# Patient Record
Sex: Female | Born: 1950 | Race: Black or African American | Hispanic: No | Smoking: Never smoker
Health system: Southern US, Community
[De-identification: ages and names within clinical notes are randomized; demographics above are authoritative.]

## PROBLEM LIST (undated history)

## (undated) DIAGNOSIS — Z9221 Personal history of antineoplastic chemotherapy: Secondary | ICD-10-CM

## (undated) DIAGNOSIS — C189 Malignant neoplasm of colon, unspecified: Secondary | ICD-10-CM

## (undated) DIAGNOSIS — Z9889 Other specified postprocedural states: Secondary | ICD-10-CM

## (undated) DIAGNOSIS — C50919 Malignant neoplasm of unspecified site of unspecified female breast: Secondary | ICD-10-CM

## (undated) DIAGNOSIS — Z923 Personal history of irradiation: Secondary | ICD-10-CM

## (undated) DIAGNOSIS — I10 Essential (primary) hypertension: Secondary | ICD-10-CM

## (undated) DIAGNOSIS — IMO0001 Reserved for inherently not codable concepts without codable children: Secondary | ICD-10-CM

## (undated) DIAGNOSIS — IMO0002 Reserved for concepts with insufficient information to code with codable children: Secondary | ICD-10-CM

## (undated) HISTORY — DX: Reserved for inherently not codable concepts without codable children: IMO0001

## (undated) HISTORY — PX: COLONOSCOPY: SHX174

## (undated) HISTORY — DX: Malignant neoplasm of colon, unspecified: C18.9

## (undated) HISTORY — DX: Other specified postprocedural states: Z98.890

## (undated) HISTORY — DX: Reserved for concepts with insufficient information to code with codable children: IMO0002

---

## 1986-09-15 HISTORY — PX: ABDOMINAL HYSTERECTOMY: SHX81

## 1986-09-15 HISTORY — PX: COLON SURGERY: SHX602

## 1998-03-12 ENCOUNTER — Ambulatory Visit (HOSPITAL_COMMUNITY): Admission: RE | Admit: 1998-03-12 | Discharge: 1998-03-12 | Payer: Self-pay | Admitting: Family Medicine

## 1999-03-14 ENCOUNTER — Ambulatory Visit (HOSPITAL_COMMUNITY): Admission: RE | Admit: 1999-03-14 | Discharge: 1999-03-14 | Payer: Self-pay | Admitting: Family Medicine

## 1999-03-14 ENCOUNTER — Encounter: Payer: Self-pay | Admitting: Family Medicine

## 1999-03-21 ENCOUNTER — Other Ambulatory Visit: Admission: RE | Admit: 1999-03-21 | Discharge: 1999-03-21 | Payer: Self-pay | Admitting: Family Medicine

## 2000-03-20 ENCOUNTER — Encounter: Payer: Self-pay | Admitting: Family Medicine

## 2000-03-20 ENCOUNTER — Ambulatory Visit (HOSPITAL_COMMUNITY): Admission: RE | Admit: 2000-03-20 | Discharge: 2000-03-20 | Payer: Self-pay | Admitting: Family Medicine

## 2001-03-22 ENCOUNTER — Ambulatory Visit (HOSPITAL_COMMUNITY): Admission: RE | Admit: 2001-03-22 | Discharge: 2001-03-22 | Payer: Self-pay | Admitting: Family Medicine

## 2001-03-22 ENCOUNTER — Encounter: Payer: Self-pay | Admitting: Family Medicine

## 2001-04-22 ENCOUNTER — Other Ambulatory Visit: Admission: RE | Admit: 2001-04-22 | Discharge: 2001-04-22 | Payer: Self-pay | Admitting: Family Medicine

## 2002-02-14 ENCOUNTER — Encounter: Admission: RE | Admit: 2002-02-14 | Discharge: 2002-02-14 | Payer: Self-pay | Admitting: Family Medicine

## 2002-02-14 ENCOUNTER — Encounter: Payer: Self-pay | Admitting: Family Medicine

## 2002-05-02 ENCOUNTER — Encounter: Payer: Self-pay | Admitting: Family Medicine

## 2002-05-02 ENCOUNTER — Ambulatory Visit (HOSPITAL_COMMUNITY): Admission: RE | Admit: 2002-05-02 | Discharge: 2002-05-02 | Payer: Self-pay | Admitting: Family Medicine

## 2003-05-10 ENCOUNTER — Ambulatory Visit (HOSPITAL_COMMUNITY): Admission: RE | Admit: 2003-05-10 | Discharge: 2003-05-10 | Payer: Self-pay | Admitting: Family Medicine

## 2003-05-10 ENCOUNTER — Encounter: Payer: Self-pay | Admitting: Family Medicine

## 2003-06-01 ENCOUNTER — Encounter: Admission: RE | Admit: 2003-06-01 | Discharge: 2003-06-01 | Payer: Self-pay | Admitting: Family Medicine

## 2003-06-01 ENCOUNTER — Encounter: Payer: Self-pay | Admitting: Family Medicine

## 2003-06-02 ENCOUNTER — Encounter: Admission: RE | Admit: 2003-06-02 | Discharge: 2003-06-21 | Payer: Self-pay | Admitting: Family Medicine

## 2004-05-14 ENCOUNTER — Ambulatory Visit (HOSPITAL_COMMUNITY): Admission: RE | Admit: 2004-05-14 | Discharge: 2004-05-14 | Payer: Self-pay | Admitting: Family Medicine

## 2004-05-23 ENCOUNTER — Ambulatory Visit (HOSPITAL_COMMUNITY): Admission: RE | Admit: 2004-05-23 | Discharge: 2004-05-23 | Payer: Self-pay | Admitting: Family Medicine

## 2004-06-28 ENCOUNTER — Emergency Department (HOSPITAL_COMMUNITY): Admission: EM | Admit: 2004-06-28 | Discharge: 2004-06-28 | Payer: Self-pay | Admitting: Emergency Medicine

## 2004-07-30 ENCOUNTER — Other Ambulatory Visit: Admission: RE | Admit: 2004-07-30 | Discharge: 2004-07-30 | Payer: Self-pay | Admitting: Family Medicine

## 2004-10-02 ENCOUNTER — Ambulatory Visit (HOSPITAL_COMMUNITY): Admission: RE | Admit: 2004-10-02 | Discharge: 2004-10-02 | Payer: Self-pay | Admitting: Gastroenterology

## 2004-10-02 ENCOUNTER — Encounter (INDEPENDENT_AMBULATORY_CARE_PROVIDER_SITE_OTHER): Payer: Self-pay | Admitting: *Deleted

## 2004-11-12 ENCOUNTER — Encounter (INDEPENDENT_AMBULATORY_CARE_PROVIDER_SITE_OTHER): Payer: Self-pay | Admitting: Specialist

## 2004-11-12 ENCOUNTER — Ambulatory Visit (HOSPITAL_COMMUNITY): Admission: RE | Admit: 2004-11-12 | Discharge: 2004-11-12 | Payer: Self-pay | Admitting: Gastroenterology

## 2005-05-27 ENCOUNTER — Ambulatory Visit (HOSPITAL_COMMUNITY): Admission: RE | Admit: 2005-05-27 | Discharge: 2005-05-27 | Payer: Self-pay | Admitting: Family Medicine

## 2005-08-12 ENCOUNTER — Other Ambulatory Visit: Admission: RE | Admit: 2005-08-12 | Discharge: 2005-08-12 | Payer: Self-pay | Admitting: Family Medicine

## 2006-05-29 ENCOUNTER — Ambulatory Visit (HOSPITAL_COMMUNITY): Admission: RE | Admit: 2006-05-29 | Discharge: 2006-05-29 | Payer: Self-pay | Admitting: Family Medicine

## 2007-04-27 ENCOUNTER — Emergency Department (HOSPITAL_COMMUNITY): Admission: EM | Admit: 2007-04-27 | Discharge: 2007-04-27 | Payer: Self-pay | Admitting: Emergency Medicine

## 2007-05-31 ENCOUNTER — Ambulatory Visit (HOSPITAL_COMMUNITY): Admission: RE | Admit: 2007-05-31 | Discharge: 2007-05-31 | Payer: Self-pay | Admitting: Family Medicine

## 2007-09-16 HISTORY — PX: TOTAL SHOULDER ARTHROPLASTY: SHX126

## 2008-02-28 ENCOUNTER — Encounter: Admission: RE | Admit: 2008-02-28 | Discharge: 2008-02-28 | Payer: Self-pay | Admitting: Orthopedic Surgery

## 2008-03-15 ENCOUNTER — Encounter: Admission: RE | Admit: 2008-03-15 | Discharge: 2008-03-15 | Payer: Self-pay | Admitting: Orthopedic Surgery

## 2008-04-06 ENCOUNTER — Inpatient Hospital Stay (HOSPITAL_COMMUNITY): Admission: RE | Admit: 2008-04-06 | Discharge: 2008-04-09 | Payer: Self-pay | Admitting: Orthopedic Surgery

## 2008-05-05 ENCOUNTER — Encounter: Admission: RE | Admit: 2008-05-05 | Discharge: 2008-05-05 | Payer: Self-pay | Admitting: Orthopedic Surgery

## 2008-06-16 ENCOUNTER — Ambulatory Visit (HOSPITAL_COMMUNITY): Admission: RE | Admit: 2008-06-16 | Discharge: 2008-06-16 | Payer: Self-pay | Admitting: Family Medicine

## 2010-04-12 ENCOUNTER — Observation Stay (HOSPITAL_COMMUNITY): Admission: EM | Admit: 2010-04-12 | Discharge: 2010-04-13 | Payer: Self-pay | Admitting: Emergency Medicine

## 2010-04-12 ENCOUNTER — Ambulatory Visit: Payer: Self-pay | Admitting: Cardiology

## 2010-08-20 ENCOUNTER — Ambulatory Visit (HOSPITAL_COMMUNITY)
Admission: RE | Admit: 2010-08-20 | Discharge: 2010-08-20 | Payer: Self-pay | Source: Home / Self Care | Admitting: Family Medicine

## 2010-11-30 LAB — CBC
HCT: 32.9 % — ABNORMAL LOW (ref 36.0–46.0)
HCT: 35.7 % — ABNORMAL LOW (ref 36.0–46.0)
MCH: 30.6 pg (ref 26.0–34.0)
MCHC: 33.8 g/dL (ref 30.0–36.0)
MCV: 88.8 fL (ref 78.0–100.0)
MCV: 89.8 fL (ref 78.0–100.0)
Platelets: 281 10*3/uL (ref 150–400)
RDW: 13.3 % (ref 11.5–15.5)
RDW: 13.4 % (ref 11.5–15.5)

## 2010-11-30 LAB — POCT CARDIAC MARKERS
Myoglobin, poc: 77.1 ng/mL (ref 12–200)
Troponin i, poc: <0.05 ng/mL (ref 0.00–0.09)

## 2010-11-30 LAB — POCT I-STAT, CHEM 8
BUN: 7 mg/dL (ref 6–23)
HCT: 36 % (ref 36.0–46.0)
Hemoglobin: 12.2 g/dL (ref 12.0–15.0)
Sodium: 141 mEq/L (ref 135–145)

## 2010-11-30 LAB — DIFFERENTIAL: Basophils Absolute: 0.1 10*3/uL (ref 0.0–0.1)

## 2010-11-30 LAB — LIPID PANEL
Total CHOL/HDL Ratio: 2.7 RATIO
VLDL: 13 mg/dL (ref 0–40)

## 2010-11-30 LAB — BASIC METABOLIC PANEL
BUN: 5 mg/dL — ABNORMAL LOW (ref 6–23)
CO2: 29 mEq/L (ref 19–32)
Chloride: 107 mEq/L (ref 96–112)
Glucose, Bld: 110 mg/dL — ABNORMAL HIGH (ref 70–99)
Potassium: 3.4 mEq/L — ABNORMAL LOW (ref 3.5–5.1)

## 2010-11-30 LAB — CK TOTAL AND CKMB (NOT AT ARMC)
Relative Index: 0.8 (ref 0.0–2.5)
Total CK: 118 U/L (ref 7–177)

## 2010-11-30 LAB — RAPID URINE DRUG SCREEN, HOSP PERFORMED
Amphetamines: NOT DETECTED
Barbiturates: NOT DETECTED

## 2010-11-30 LAB — TSH: TSH: 1.047 u[IU]/mL (ref 0.350–4.500)

## 2010-11-30 LAB — TROPONIN I: Troponin I: 0.01 ng/mL (ref 0.00–0.06)

## 2010-11-30 LAB — D-DIMER, QUANTITATIVE: D-Dimer, Quant: 0.74 ug/mL-FEU — ABNORMAL HIGH (ref 0.00–0.48)

## 2011-01-28 NOTE — Op Note (Signed)
NAMENAEVIA, UNTERREINER              ACCOUNT NO.:  [id number]   MEDICAL RECORD NO.:  [id number]          PATIENT TYPE:  INP   LOCATION:  5031                         FACILITY:  [facility]   PHYSICIAN:  Myrtie Neither, MD      DATE OF BIRTH:  April 09, 1951   DATE OF PROCEDURE:  04/06/2008  DATE OF DISCHARGE:  04/09/2008                               OPERATIVE REPORT   PREOPERATIVE DIAGNOSES:  Aseptic necrosis, degenerative joint  arthropathy, right shoulder.   POSTOPERATIVE DIAGNOSES:  Aseptic necrosis, degenerative joint  arthropathy, right shoulder.   ANESTHESIA:  General.   PROCEDURES:  Right total shoulder arthroplasty with Biomet implant.   The patient was taken to the operating room after given adequate preop  medications, given general anesthesia, and intubated.  The patient was  placed in barber-chair position.  Right shoulder prepped with DuraPrep  and draped in a sterile manner.  Deltopectoral incision was made going  through skin, subcutaneous tissue.  Deltopectoral muscle separation was  done down through the subscapularis.  Subscapularis was then released  and reflected medially.  Capsule was incised.  Humeral head,  glenohumeral joint was exposed.  Head itself was dislocated.  Intramedullary guide was placed down into the humeral canal just medial  to the tuberosity.  Appropriate settings were made for retroversion and  the humeral head cut was made.  With the humeral head removed and  intramedullary reaming of the humeral canal was done and rasping was  done for an 8-mm stem.  After adequate rasping, an 8 mm x 122 mm porous-  coated humeral stem from a standard was used with a modular head 42 x 18  x 46 mm with a taper adapter.  This was press-fitted down the humeral  shaft.  Then with the head relocated and the glenoid extracted  anteriorly, appropriate cutting jigs for the glenoid was done with  resection of the articular surface and this was fitted for a 4-mm small  glenoid base.  Appropriate drill holes were placed through the cutting  jig with a central was press-fit porous-coated stem, and after adequate  irrigation and use of the trial, which showed good stability and good  range of motion in abduction, full extension without subluxation.  Trial  components were removed followed by mixing of cement and the glenoid was  cemented with a porous-coated holes.  Excess methylmethacrylate was  removed after cement had set.  Humeral head was then reduced and again  range of motion was tested and did not demonstrate any subluxation or  dislocation.  Good glenohumeral contact.  Further irrigation was then  done.  Wound closure was then done with reapproximation of the  subscapularis and reapproximation of the fascia of the deltoid.  Subcutaneous and this was done with 0 Vicryl.  Subcutaneous was done  with 2-0 Vicryl and skin staples for skin.  With the arm in the internal  rotation, shoulder abduction with a pillow, brace was applied.  The  patient tolerated the procedure quite well, went to recovery room in  stable and satisfactory condition.      Myrtie Neither,  MD  Electronically Signed     AC/MEDQ  D:  05/31/2008  T:  05/31/2008  Job:  [id number]

## 2011-01-28 NOTE — Discharge Summary (Signed)
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   DISCHARGE DIAGNOSES:  1. Avascular necrosis and severe degenerative joint disease, right      shoulder.  2. History of hypertension.   INFECTIONS:  None.   OPERATION:  Right total shoulder arthroplasty, Biomet implant.   COMPLICATION:  None.   PERTINENT HISTORY:  This is a 60 year old female who had a long history  of right shoulder pain, progressive weakness, and loss of function.  The  patient had been treated with anti-inflammatories and pain medications.  The patient was found to have aseptic necrosis of her humeral head and  severe degenerative joint arthropathy of the right shoulder.   PERTINENT PHYSICAL:  The right shoulder tender anterolaterally.  Audible  and palpable crepitus on attempted abduction as well as rotation.  Limited rotation and abduction.  Weakness and increased pain on  resistive abduction.  Good grip.  The patient is intrinsically intact.   HOSPITAL COURSE:  The patient underwent preop medical eval by her  medical doctor as well as CBC, EKG, chest x-ray, PT/PTT, platelet count,  UA, and CMET.  The patient was found to be stable to undergo surgery and  underwent right total shoulder arthroplasty and tolerated the procedure  quite well.  Postop course fairly benign.  The patient was started on  pre and postop IV antibiotics and prophylactic Coumadin.  The patient's  pain was brought under control with oral Percocet 1-2 q.4 h. p.r.n.  pain, afebrile.  The patient was able to be discharged on Coumadin 5  mg  daily, Percocet 5 mg 1-2 q.4 h. p.r.n. pain.  Continue ice pack and  continue with the shoulder abduction brace.  The patient will be seen  back in the office in 1 week and was discharged in stable and  satisfactory condition.      Myrtie Neither, MD  Electronically Signed     AC/MEDQ  D:  05/31/2008  T:  05/31/2008  Job:  [id number]

## 2011-01-31 NOTE — Op Note (Signed)
[name], [name]              ACCOUNT NO.:  [id number]   MEDICAL RECORD NO.:  [id number]          PATIENT TYPE:  AMB   LOCATION:  ENDO                         FACILITY:  [facility]   PHYSICIAN:  Anselmo Rod, M.D.  DATE OF BIRTH:  08-05-1951   DATE OF PROCEDURE:  10/02/2004  DATE OF DISCHARGE:                                 OPERATIVE REPORT   PROCEDURE PERFORMED:  Colonoscopy with snare polypectomy x2.   ENDOSCOPIST:  Anselmo Rod, M.D.   INSTRUMENT USED:  Olympus video colonoscope.   INDICATION FOR PROCEDURE:  A 60 year old African-American female undergoing  a screening colonoscopy to rule out colonic polyps, masses, etc.  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  No records are available to me at this time  with regard to the details on this surgery.  The patient says she had a  hysterectomy at the same time and was incidentally found to have a tumor,  question in her colon or on her colon.  The details seem to be somewhat  unclear.   PREPROCEDURE PREPARATION:  Informed consent was procured from the patient.  The patient was fasted for eight hours prior to the procedure and prepped  with a bottle of magnesium citrate and a gallon of GoLYTELY the night prior  to the procedure.  Risks and benefits of the procedure including a 10% miss  rate of cancer and polyps were discussed with her as well.   PREPROCEDURE PHYSICAL:  VITAL SIGNS:  The patient had stable vital signs.  NECK:  Supple.  CHEST:  Clear to auscultation.  S1, S2 regular.  ABDOMEN:  Soft with normal bowel sounds.  A well-healed surgical scar from  previous partial colectomy and colostomy that has been reversed now.   DESCRIPTION OF PROCEDURE:  The patient was placed in the left lateral  decubitus position and sedated with 75 mg of Demerol and 7.5 mg of Versed in  slow incremental doses.  Once the patient was adequately sedate and  maintained  on low-flow oxygen and continuous cardiac monitoring, the Olympus  video colonoscope was advanced from the rectum to the anastomosis without  difficulty.  The distal small bowel limb appeared healthy, and so did the  anastomosis.  The patient seemed to have undergone a right hemicolectomy.  There were scattered diverticula seen throughout the colon.  An inverted  diverticulum was appreciated in the sigmoid colon.  A small sessile polyp  was snared from 55 cm and another polyp snared from the rectosigmoid colon  and were placed in one bottle.   IMPRESSION:  1.  Scattered early diverticulosis.  2.  Status post right hemicolectomy with a healthy anastomosis and distal      small bowel limb.  3.  One small sessile polyp snared from 65 cm, another small sessile polyp      snared from rectosigmoid colon.   RECOMMENDATIONS:  1.  Await pathology results.  2.  Avoid nonsteroidals, including aspirin, for the next four weeks.  3.  Outpatient follow-up in the  next two weeks for further recommendations      and follow-up on abnormal LFTs.      Jyot   JNM/MEDQ  D:  10/02/2004  T:  10/02/2004  Job:  [id number]   cc:   Renaye Rakers, M.D.  [phone] [address]  [place]  [place] [address]  Fax: [phone]

## 2011-06-13 LAB — ABO/RH: ABO/RH(D): O POS

## 2011-06-13 LAB — COMPREHENSIVE METABOLIC PANEL
Albumin: 3.6
Alkaline Phosphatase: 103
BUN: 6
Calcium: 9.4
Potassium: 3.7
Sodium: 141
Total Protein: 6.9

## 2011-06-13 LAB — PROTIME-INR
INR: 1
INR: 1.2
INR: 4.2 — ABNORMAL HIGH
Prothrombin Time: 13.5
Prothrombin Time: 44 — ABNORMAL HIGH

## 2011-06-13 LAB — TYPE AND SCREEN: Antibody Screen: NEGATIVE

## 2011-06-13 LAB — HEMOGLOBIN AND HEMATOCRIT, BLOOD: Hemoglobin: 9.2 — ABNORMAL LOW

## 2011-06-13 LAB — URINALYSIS, ROUTINE W REFLEX MICROSCOPIC
Hgb urine dipstick: NEGATIVE
Nitrite: NEGATIVE
Protein, ur: NEGATIVE
Specific Gravity, Urine: 1.021
Urobilinogen, UA: 0.2

## 2011-06-13 LAB — CBC
HCT: 33.6 — ABNORMAL LOW
MCHC: 32.5
Platelets: 360
RDW: 14.7

## 2011-07-17 ENCOUNTER — Other Ambulatory Visit (HOSPITAL_COMMUNITY): Payer: Self-pay | Admitting: Family Medicine

## 2011-07-17 DIAGNOSIS — Z1231 Encounter for screening mammogram for malignant neoplasm of breast: Secondary | ICD-10-CM

## 2011-08-22 ENCOUNTER — Ambulatory Visit (HOSPITAL_COMMUNITY)
Admission: RE | Admit: 2011-08-22 | Discharge: 2011-08-22 | Disposition: A | Discharge status: Home / Self Care | Payer: Medicare Other | Source: Ambulatory Visit | Attending: Family Medicine | Admitting: Family Medicine

## 2011-08-22 DIAGNOSIS — Z1231 Encounter for screening mammogram for malignant neoplasm of breast: Secondary | ICD-10-CM

## 2012-08-09 ENCOUNTER — Other Ambulatory Visit (HOSPITAL_COMMUNITY): Payer: Self-pay | Admitting: Family Medicine

## 2012-08-09 DIAGNOSIS — Z1231 Encounter for screening mammogram for malignant neoplasm of breast: Secondary | ICD-10-CM

## 2012-08-26 ENCOUNTER — Ambulatory Visit (HOSPITAL_COMMUNITY)
Admission: RE | Admit: 2012-08-26 | Discharge: 2012-08-26 | Disposition: A | Discharge status: Home / Self Care | Payer: Medicare Other | Source: Ambulatory Visit | Attending: Family Medicine | Admitting: Family Medicine

## 2012-08-26 DIAGNOSIS — Z1231 Encounter for screening mammogram for malignant neoplasm of breast: Secondary | ICD-10-CM | POA: Diagnosis not present

## 2012-09-01 ENCOUNTER — Other Ambulatory Visit: Payer: Self-pay | Admitting: Family Medicine

## 2012-09-01 DIAGNOSIS — R928 Other abnormal and inconclusive findings on diagnostic imaging of breast: Secondary | ICD-10-CM

## 2012-09-14 ENCOUNTER — Ambulatory Visit
Admission: RE | Admit: 2012-09-14 | Discharge: 2012-09-14 | Disposition: A | Discharge status: Home / Self Care | Payer: Medicare Other | Source: Ambulatory Visit | Attending: Family Medicine | Admitting: Family Medicine

## 2012-09-14 DIAGNOSIS — R928 Other abnormal and inconclusive findings on diagnostic imaging of breast: Secondary | ICD-10-CM

## 2013-02-14 ENCOUNTER — Other Ambulatory Visit: Payer: Self-pay | Admitting: Family Medicine

## 2013-02-14 DIAGNOSIS — N6489 Other specified disorders of breast: Secondary | ICD-10-CM

## 2013-03-07 ENCOUNTER — Ambulatory Visit
Admission: RE | Admit: 2013-03-07 | Discharge: 2013-03-07 | Disposition: A | Discharge status: Home / Self Care | Payer: Medicare Other | Source: Ambulatory Visit | Attending: Family Medicine | Admitting: Family Medicine

## 2013-03-07 DIAGNOSIS — R928 Other abnormal and inconclusive findings on diagnostic imaging of breast: Secondary | ICD-10-CM | POA: Diagnosis not present

## 2013-03-07 DIAGNOSIS — N6489 Other specified disorders of breast: Secondary | ICD-10-CM

## 2013-09-05 ENCOUNTER — Other Ambulatory Visit: Payer: Self-pay | Admitting: Family Medicine

## 2013-09-05 DIAGNOSIS — N6489 Other specified disorders of breast: Secondary | ICD-10-CM

## 2014-01-12 DIAGNOSIS — M25669 Stiffness of unspecified knee, not elsewhere classified: Secondary | ICD-10-CM | POA: Diagnosis not present

## 2014-02-03 ENCOUNTER — Other Ambulatory Visit: Payer: Self-pay | Admitting: Nurse Practitioner

## 2014-02-03 DIAGNOSIS — M25669 Stiffness of unspecified knee, not elsewhere classified: Secondary | ICD-10-CM | POA: Diagnosis not present

## 2014-02-03 DIAGNOSIS — Z01419 Encounter for gynecological examination (general) (routine) without abnormal findings: Secondary | ICD-10-CM | POA: Diagnosis not present

## 2014-02-03 DIAGNOSIS — R928 Other abnormal and inconclusive findings on diagnostic imaging of breast: Secondary | ICD-10-CM | POA: Diagnosis not present

## 2014-02-03 DIAGNOSIS — R799 Abnormal finding of blood chemistry, unspecified: Secondary | ICD-10-CM | POA: Diagnosis not present

## 2014-02-03 DIAGNOSIS — Z0289 Encounter for other administrative examinations: Secondary | ICD-10-CM | POA: Diagnosis not present

## 2014-02-03 DIAGNOSIS — E7800 Pure hypercholesterolemia, unspecified: Secondary | ICD-10-CM | POA: Diagnosis not present

## 2014-02-03 DIAGNOSIS — N6489 Other specified disorders of breast: Secondary | ICD-10-CM

## 2014-02-03 DIAGNOSIS — Z0000 Encounter for general adult medical examination without abnormal findings: Secondary | ICD-10-CM | POA: Diagnosis not present

## 2014-02-20 ENCOUNTER — Ambulatory Visit
Admission: RE | Admit: 2014-02-20 | Discharge: 2014-02-20 | Disposition: A | Discharge status: Home / Self Care | Payer: Medicare Other | Source: Ambulatory Visit | Attending: Nurse Practitioner | Admitting: Nurse Practitioner

## 2014-02-20 ENCOUNTER — Encounter (INDEPENDENT_AMBULATORY_CARE_PROVIDER_SITE_OTHER): Payer: Self-pay

## 2014-02-20 ENCOUNTER — Other Ambulatory Visit: Payer: Self-pay | Admitting: Nurse Practitioner

## 2014-02-20 DIAGNOSIS — N6489 Other specified disorders of breast: Secondary | ICD-10-CM

## 2014-02-20 DIAGNOSIS — N63 Unspecified lump in unspecified breast: Secondary | ICD-10-CM | POA: Diagnosis not present

## 2014-02-22 ENCOUNTER — Ambulatory Visit
Admission: RE | Admit: 2014-02-22 | Discharge: 2014-02-22 | Disposition: A | Discharge status: Home / Self Care | Payer: Medicare Other | Source: Ambulatory Visit | Attending: Nurse Practitioner | Admitting: Nurse Practitioner

## 2014-02-22 ENCOUNTER — Other Ambulatory Visit: Payer: Self-pay | Admitting: Nurse Practitioner

## 2014-02-22 DIAGNOSIS — R599 Enlarged lymph nodes, unspecified: Secondary | ICD-10-CM | POA: Diagnosis not present

## 2014-02-22 DIAGNOSIS — C50919 Malignant neoplasm of unspecified site of unspecified female breast: Secondary | ICD-10-CM | POA: Diagnosis not present

## 2014-02-22 DIAGNOSIS — N63 Unspecified lump in unspecified breast: Secondary | ICD-10-CM | POA: Diagnosis not present

## 2014-02-22 DIAGNOSIS — C773 Secondary and unspecified malignant neoplasm of axilla and upper limb lymph nodes: Secondary | ICD-10-CM | POA: Diagnosis not present

## 2014-02-22 DIAGNOSIS — N6489 Other specified disorders of breast: Secondary | ICD-10-CM

## 2014-02-23 ENCOUNTER — Other Ambulatory Visit: Payer: Self-pay | Admitting: Nurse Practitioner

## 2014-02-23 DIAGNOSIS — C50919 Malignant neoplasm of unspecified site of unspecified female breast: Secondary | ICD-10-CM

## 2014-02-24 ENCOUNTER — Telehealth: Payer: Self-pay | Admitting: *Deleted

## 2014-02-24 DIAGNOSIS — C50412 Malignant neoplasm of upper-outer quadrant of left female breast: Secondary | ICD-10-CM

## 2014-02-24 NOTE — Telephone Encounter (Signed)
Confirmed [facility] for 03/01/14 at 12N .  Instructions and contact information given.

## 2014-03-01 ENCOUNTER — Ambulatory Visit
Admission: RE | Admit: 2014-03-01 | Discharge: 2014-03-01 | Disposition: A | Discharge status: Home / Self Care | Payer: Medicare Other | Source: Ambulatory Visit | Attending: Nurse Practitioner | Admitting: Nurse Practitioner

## 2014-03-01 ENCOUNTER — Ambulatory Visit: Payer: Medicare Other | Attending: General Surgery | Admitting: Physical Therapy

## 2014-03-01 ENCOUNTER — Encounter: Payer: Self-pay | Admitting: Oncology

## 2014-03-01 ENCOUNTER — Encounter: Payer: Self-pay | Admitting: Radiation Oncology

## 2014-03-01 ENCOUNTER — Other Ambulatory Visit (HOSPITAL_BASED_OUTPATIENT_CLINIC_OR_DEPARTMENT_OTHER): Payer: Medicare Other

## 2014-03-01 ENCOUNTER — Ambulatory Visit
Admission: RE | Admit: 2014-03-01 | Discharge: 2014-03-01 | Disposition: A | Discharge status: Home / Self Care | Payer: Medicare Other | Source: Ambulatory Visit | Attending: Radiation Oncology | Admitting: Radiation Oncology

## 2014-03-01 ENCOUNTER — Telehealth: Payer: Self-pay | Admitting: Oncology

## 2014-03-01 ENCOUNTER — Ambulatory Visit (HOSPITAL_BASED_OUTPATIENT_CLINIC_OR_DEPARTMENT_OTHER): Payer: Medicare Other | Admitting: General Surgery

## 2014-03-01 ENCOUNTER — Ambulatory Visit: Payer: Medicare Other

## 2014-03-01 ENCOUNTER — Ambulatory Visit (HOSPITAL_BASED_OUTPATIENT_CLINIC_OR_DEPARTMENT_OTHER): Payer: Medicare Other | Admitting: Oncology

## 2014-03-01 ENCOUNTER — Telehealth: Payer: Self-pay | Admitting: *Deleted

## 2014-03-01 ENCOUNTER — Encounter: Payer: Self-pay | Admitting: *Deleted

## 2014-03-01 ENCOUNTER — Encounter (INDEPENDENT_AMBULATORY_CARE_PROVIDER_SITE_OTHER): Payer: Self-pay | Admitting: General Surgery

## 2014-03-01 VITALS — BP 138/92 | HR 74 | Temp 98.7°F | Resp 18 | Ht 61.0 in | Wt 245.2 lb

## 2014-03-01 DIAGNOSIS — C50412 Malignant neoplasm of upper-outer quadrant of left female breast: Secondary | ICD-10-CM

## 2014-03-01 DIAGNOSIS — Z85038 Personal history of other malignant neoplasm of large intestine: Secondary | ICD-10-CM | POA: Diagnosis not present

## 2014-03-01 DIAGNOSIS — C773 Secondary and unspecified malignant neoplasm of axilla and upper limb lymph nodes: Secondary | ICD-10-CM

## 2014-03-01 DIAGNOSIS — M25519 Pain in unspecified shoulder: Secondary | ICD-10-CM | POA: Diagnosis not present

## 2014-03-01 DIAGNOSIS — IMO0001 Reserved for inherently not codable concepts without codable children: Secondary | ICD-10-CM | POA: Diagnosis not present

## 2014-03-01 DIAGNOSIS — Z171 Estrogen receptor negative status [ER-]: Secondary | ICD-10-CM

## 2014-03-01 DIAGNOSIS — C50419 Malignant neoplasm of upper-outer quadrant of unspecified female breast: Secondary | ICD-10-CM | POA: Diagnosis not present

## 2014-03-01 DIAGNOSIS — C50919 Malignant neoplasm of unspecified site of unspecified female breast: Secondary | ICD-10-CM | POA: Diagnosis not present

## 2014-03-01 DIAGNOSIS — M24519 Contracture, unspecified shoulder: Secondary | ICD-10-CM | POA: Diagnosis not present

## 2014-03-01 DIAGNOSIS — Z803 Family history of malignant neoplasm of breast: Secondary | ICD-10-CM | POA: Diagnosis not present

## 2014-03-01 LAB — CBC WITH DIFFERENTIAL/PLATELET
BASO%: 0.3 % (ref 0.0–2.0)
BASOS ABS: 0 10*3/uL (ref 0.0–0.1)
EOS%: 2.1 % (ref 0.0–7.0)
Eosinophils Absolute: 0.1 10*3/uL (ref 0.0–0.5)
HEMATOCRIT: 37.4 % (ref 34.8–46.6)
HGB: 12.1 g/dL (ref 11.6–15.9)
LYMPH%: 31 % (ref 14.0–49.7)
MCH: 28.7 pg (ref 25.1–34.0)
MCHC: 32.4 g/dL (ref 31.5–36.0)
MCV: 88.6 fL (ref 79.5–101.0)
MONO#: 0.5 10*3/uL (ref 0.1–0.9)
MONO%: 7.3 % (ref 0.0–14.0)
NEUT%: 59.3 % (ref 38.4–76.8)
NEUTROS ABS: 3.9 10*3/uL (ref 1.5–6.5)
PLATELETS: 300 10*3/uL (ref 145–400)
RBC: 4.22 10*6/uL (ref 3.70–5.45)
RDW: 12.9 % (ref 11.2–14.5)
WBC: 6.6 10*3/uL (ref 3.9–10.3)
lymph#: 2 10*3/uL (ref 0.9–3.3)

## 2014-03-01 LAB — COMPREHENSIVE METABOLIC PANEL (CC13)
ALBUMIN: 3.5 g/dL (ref 3.5–5.0)
ALK PHOS: 117 U/L (ref 40–150)
ALT: 10 U/L (ref 0–55)
AST: 10 U/L (ref 5–34)
Anion Gap: 8 mEq/L (ref 3–11)
BUN: 10.7 mg/dL (ref 7.0–26.0)
CO2: 26 mEq/L (ref 22–29)
Calcium: 9.3 mg/dL (ref 8.4–10.4)
Chloride: 111 mEq/L — ABNORMAL HIGH (ref 98–109)
Creatinine: 1.4 mg/dL — ABNORMAL HIGH (ref 0.6–1.1)
Glucose: 92 mg/dl (ref 70–140)
Potassium: 3.9 mEq/L (ref 3.5–5.1)
Sodium: 145 mEq/L (ref 136–145)
Total Bilirubin: 1.3 mg/dL — ABNORMAL HIGH (ref 0.20–1.20)
Total Protein: 7.4 g/dL (ref 6.4–8.3)

## 2014-03-01 MED ORDER — GADOBENATE DIMEGLUMINE 529 MG/ML IV SOLN
20.0000 mL | Freq: Once | INTRAVENOUS | Status: AC | PRN
  Administered 2014-03-01: 20 mL via INTRAVENOUS

## 2014-03-01 NOTE — Progress Notes (Signed)
Checked in new patient with no financial issues at this time prior to seeing the dr. She has appt card and I had to give her [facility] [employer] forms. [place] been [place] and I advised her of [place]ight [place] and she will get her income vrf back to me

## 2014-03-01 NOTE — Progress Notes (Signed)
[name]  [phone]) [phone] Fax:(336) [phone]     ID: [name] DOB: 11/05/1950  MR#: [id number]  [id number]#:[id number]  PCP: Ruth Greenland, MD GYN: SU: [name] OTHER MD: [name]  CHIEF COMPLAINT: Stage III breast Lopez  CURRENT TREATMENT: Neoadjuvant chemotherapy   BREAST Lopez HISTORY: [name] herself palpated a mass in her left breast, and immediately brought it to the attention of her primary care physician, Dr. Baird Lopez, who confirmed the finding and setup the patient for bilateral diagnostic mammography at the breast Center 02/20/2014. This showed a high density mass in the outer left breast measuring up to 3.7 cm. It corresponded to the site of palpable concern. In addition the normal 4 logically abnormal lymph nodes in the left axilla. The mass was palpable by exam, and by ultrasonography measured 2.6 cm. There were enlarged and morphologically abnormal lymph nodes in the left axilla, largest measuring 3 cm.  Biopsy of the left breast mass in question and 1 of the abnormal axillary lymph nodes 02/22/2014 showed ([id number]) biopsies to be positive for an invasive ductal carcinoma, grade 3, triple negative, with an MIB-1 of 90%. Specifically the HER-2 signals ratio was 1.05, and the number per cell 2.00.  MRI of the breasts 03/01/2014 showed a 3.8 cm enhancing mass with areas of apparent necrosis in the left breast, as well as the biopsy tract extending laterally, the combined measure 5.3 cm. There were no other masses or areas of abnormal enhancement. There were multiple abnormally enlarged left axillary lymph nodes with loss of the normal fatty hilum. These included level I and retropectoral lymph nodes.  The patient's subsequent history is as detailed below  INTERVAL HISTORY: [name] was evaluated in the multidisciplinary breast Lopez clinic 03/01/2014 accompanied by her daughter [name].  REVIEW OF SYSTEMS: Aside from the finding of the  mass itself, there were no specific symptoms leading to the June mammogram The patient denies unusual headaches, visual changes, nausea, vomiting, stiff neck, dizziness, or gait imbalance. There has been no cough, phlegm production, or pleurisy, no chest pain or pressure, and no change in bowel or bladder habits. The patient denies fever, rash, bleeding, unexplained fatigue or unexplained weight loss. The patient does not exercise regularly, but does her house work and "stays busy" keeping her grand children A detailed review of systems today was otherwise entirely negative.  PAST MEDICAL HISTORY: Past Medical History  Diagnosis Date  . Colon Lopez   . Hx of rotator cuff surgery     PAST SURGICAL HISTORY: Past Surgical History  Procedure Laterality Date  . Abdominal hysterectomy    . Colon surgery      colectomy/colostomy with takedown    FAMILY HISTORY Family History  Problem Relation Age of Onset  . Breast Lopez Mother    the patient's father died at the age of 74 following a stroke in the setting of diabetes; the patient's mother is living at 42. The patient has 3 brothers and 2 sisters. [name], [name], was diagnosed with breast Lopez in her early 41s. There is no other breast or ovarian Lopez in the family. The patient herself was diagnosed with [name] by history he is an incidentally found stage I colon carcinoma noted at the time of her hysterectomy. She had a partial colectomy but no adjuvant treatment. We have no records regarding that procedure  GYNECOLOGIC HISTORY:  No LMP recorded. Patient has had a hysterectomy. Menarche age 63, first live birth age 50, the  patient underwent total abdominal hysterectomy with bilateral salpingo-oophorectomy in her 62s. She did not take hormone replacement. She did not take oral contraceptives at any point.  SOCIAL HISTORY:  [name] work for [employer] for about 40 years, retiring in 2008. She is divorced and lives alone, with  no pets. Her daughter [name] works for [employer] in Press [employer]. The second daughter, [name], works as a Research scientist (physical sciences) for [employer]. The patient has 6 grandchildren and one great-grandchild. She attends a [employer].    ADVANCED DIRECTIVES: Not in place. The patient intends to name her daughter [name]. [name] is work number is [id number]. On the patient's 03/01/2014 visit she was given a copy of the appropriate documents to complete and notarize at her discretion   HEALTH MAINTENANCE: History  Substance Use Topics  . Smoking status: Never Smoker   . Smokeless tobacco: Not on file  . Alcohol Use: No     Colonoscopy: 2000  PAP:  Bone density: 05/23/2004, at [facility] hospital; normal  Lipid panel:  No Known Allergies  No current outpatient prescriptions on file.   No current facility-administered medications for this visit.    OBJECTIVE: Middle-aged [place] American woman in no acute distress Filed Vitals:   03/01/14 1301  BP: 138/92  Pulse: 74  Temp: 98.7 F (37.1 C)  Resp: 18     Body mass index is 46.35 kg/(m^2).    ECOG FS:0 - Asymptomatic  Ocular: Sclerae unicteric, pupils are round and equal Ear-nose-throat: Oropharynx clear, dentition in good repair Lymphatic: No cervical or supraclavicular adenopathy Lungs no rales or rhonchi, fair excursion bilaterally Heart regular rate and rhythm, no murmur appreciated Abd soft, obese, nontender, positive bowel sounds MSK no focal spinal tenderness, no upper extremity lymphedema Neuro: non-focal, well-oriented, positive affect Breasts: The right breast is unremarkable. In the left breast laterally there is a firm movable mass measuring perhaps 3-4 cm by palpation. There are no associated skin or nipple changes. There is palpable adenopathy deep in the left axilla.   LAB RESULTS:  CMP     Component Value Date/Time   NA 145 03/01/2014 1204   NA 142 04/13/2010 0403   K 3.9  03/01/2014 1204   K 3.4* 04/13/2010 0403   CL 107 04/13/2010 0403   CO2 26 03/01/2014 1204   CO2 29 04/13/2010 0403   GLUCOSE 92 03/01/2014 1204   GLUCOSE 110* 04/13/2010 0403   BUN 10.7 03/01/2014 1204   BUN 5* 04/13/2010 0403   CREATININE 1.4* 03/01/2014 1204   CREATININE 0.81 04/13/2010 0403   CALCIUM 9.3 03/01/2014 1204   CALCIUM 8.4 04/13/2010 0403   PROT 7.4 03/01/2014 1204   PROT 6.9 04/05/2008 1120   ALBUMIN 3.5 03/01/2014 1204   ALBUMIN 3.6 04/05/2008 1120   AST 10 03/01/2014 1204   AST 16 04/05/2008 1120   ALT 10 03/01/2014 1204   ALT 14 04/05/2008 1120   ALKPHOS 117 03/01/2014 1204   ALKPHOS 103 04/05/2008 1120   BILITOT 1.30* 03/01/2014 1204   BILITOT 1.2 04/05/2008 1120   GFRNONAA >60 04/13/2010 0403   GFRAA  Value: >60        The eGFR has been calculated using the MDRD equation. This calculation has not been validated in all clinical situations. eGFR's persistently <60 mL/min signify possible Chronic Kidney Disease. 04/13/2010 0403    I No results found for this basename: SPEP, UPEP,  kappa and lambda light chains    Lab Results  Component Value Date   WBC 6.6 03/01/2014   NEUTROABS 3.9 03/01/2014   HGB 12.1 03/01/2014   HCT 37.4 03/01/2014   MCV 88.6 03/01/2014   PLT 300 03/01/2014      Chemistry      Component Value Date/Time   NA 145 03/01/2014 1204   NA 142 04/13/2010 0403   K 3.9 03/01/2014 1204   K 3.4* 04/13/2010 0403   CL 107 04/13/2010 0403   CO2 26 03/01/2014 1204   CO2 29 04/13/2010 0403   BUN 10.7 03/01/2014 1204   BUN 5* 04/13/2010 0403   CREATININE 1.4* 03/01/2014 1204   CREATININE 0.81 04/13/2010 0403      Component Value Date/Time   CALCIUM 9.3 03/01/2014 1204   CALCIUM 8.4 04/13/2010 0403   ALKPHOS 117 03/01/2014 1204   ALKPHOS 103 04/05/2008 1120   AST 10 03/01/2014 1204   AST 16 04/05/2008 1120   ALT 10 03/01/2014 1204   ALT 14 04/05/2008 1120   BILITOT 1.30* 03/01/2014 1204   BILITOT 1.2 04/05/2008 1120       No results found for this basename: LABCA2    No  components found with this basename: LABCA125    No results found for this basename: INR,  in the last 168 hours  Urinalysis    Component Value Date/Time   COLORURINE YELLOW 04/05/2008 1120   APPEARANCEUR CLEAR 04/05/2008 1120   LABSPEC 1.021 04/05/2008 1120   PHURINE 6.0 04/05/2008 1120   GLUCOSEU NEGATIVE 04/05/2008 1120   HGBUR NEGATIVE 04/05/2008 1120   BILIRUBINUR NEGATIVE 04/05/2008 1120   KETONESUR NEGATIVE 04/05/2008 1120   PROTEINUR NEGATIVE 04/05/2008 1120   UROBILINOGEN 0.2 04/05/2008 1120   NITRITE NEGATIVE 04/05/2008 1120   LEUKOCYTESUR NEGATIVE MICROSCOPIC NOT DONE ON URINES WITH NEGATIVE PROTEIN, BLOOD, LEUKOCYTES, NITRITE, OR GLUCOSE <1000 mg/dL. 04/05/2008 1120    STUDIES: Mr Breast Bilateral W Wo Contrast  03/01/2014   CLINICAL DATA:  Recently diagnosed left breast invasive ductal carcinoma and metastatic left axillary lymph node.  LABS:  BUN and creatinine were obtained on site at [facility] at  [address]  Results:  BUN 9 mg/dL,  Creatinine 1.0 mg/dL.  EXAM: BILATERAL BREAST MRI WITH AND WITHOUT CONTRAST  TECHNIQUE: Multiplanar, multisequence MR images of both breasts were obtained prior to and following the intravenous administration of 41m of MultiHance.  THREE-DIMENSIONAL MR IMAGE RENDERING ON INDEPENDENT WORKSTATION:  Three-dimensional MR images were rendered by post-processing of the original MR data on an independent workstation. The three-dimensional MR images were interpreted, and findings are reported in the following complete MRI report for this study. Three dimensional images were evaluated at the independent DynaCad workstation  COMPARISON:  Recent mammogram, ultrasound and biopsy examinations.  FINDINGS: Breast composition: b.  Scattered fibroglandular tissue.  Background parenchymal enhancement: Minimal  Right breast: No mass or abnormal enhancement.  Left breast: 3.8 x 3.6 x 2.9 cm rounded enhancing mass with areas of nonenhancement compatible with  necrosis in the 12 o'clock position of the left breast, containing a biopsy marker clip artifact. There is also enhancement surrounding the biopsy tract extending lateral to the mass. The tract and mass combined measure 5.3 cm in maximum diameter. The mass and enhancement surrounding the biopsy tract have persistent and plateau enhancement kinetics. No additional masses or areas of abnormal enhancement suspicious for malignancy in the left breast.  Lymph nodes: Multiple abnormally enlarged, rounded left axillary lymph nodes with loss of the normal fatty hila. These include level  1 and retropectoral lymph nodes.  Ancillary findings:  None.  IMPRESSION: 1. 3.8 x 3.6 x 2.9 cm biopsy-proven invasive ductal carcinoma in the 12 o'clock position of the left breast with adjacent post biopsy changes extending laterally, with a combined maximum diameter of 5.3 cm. 2. Multiple metastatic Level 1 and Level 2 left axillary lymph nodes. 3. No evidence of malignancy on the right.  RECOMMENDATION: Treatment plan.  BI-RADS CATEGORY  6: Known biopsy-proven malignancy.   Electronically Signed   By: Enrique Sack M.D.   On: 03/01/2014 09:56   Mm Digital Diagnostic Bilat  02/20/2014   CLINICAL DATA:  63 year old female with a left breast palpable abnormality. The patient had a previously seen probably benign asymmetry in the left breast which she failed to follow-up.  EXAM: DIGITAL DIAGNOSTIC  LEFT MAMMOGRAM WITH CAD  ULTRASOUND LEFT BREAST  COMPARISON:  Previous exams.  ACR Breast Density Category b: There are scattered areas of fibroglandular density.  FINDINGS: There is a high density mass in the outer left breast with associated distortion measuring up to 3.7 cm and confirmed on the additional spot compression tangential view. This corresponds to the site of palpable concern in the left breast. In addition, there are morphologically abnormal lymph nodes in the left axilla.  Mammographic images were processed with CAD.  Physical  examination at site of palpable concern in the left breast reveals a firm mass at the approximate 12 to 1 o'clock position.  Targeted ultrasound of the left breast was performed demonstrating an irregular hypoechoic mass at 1 o'clock 2 cm from the nipple measuring 2.6 x 2.5 x 2.6 cm. This corresponds with mammography findings.  Enlarged and morphologically abnormal lymph nodes are seen in the left axilla, the largest of which measures 3 x 2.1 x 2 cm.  IMPRESSION: Highly suspicious left breast mass and left axillary lymphadenopathy.  RECOMMENDATION: Ultrasound-guided biopsy of the mass in the left breast as well as the enlarged left axillary lymph node is recommended. This is scheduled for Wednesday June 10th at 11:30 a.m.  I have discussed the findings and recommendations with the patient. Results were also provided in writing at the conclusion of the visit. If applicable, a reminder letter will be sent to the patient regarding the next appointment.  BI-RADS CATEGORY  5: Highly suggestive of malignancy.   Electronically Signed   By: Everlean Alstrom M.D.   On: 02/20/2014 13:42   Mm Digital Diagnostic Unilat L  02/22/2014   CLINICAL DATA:  Post biopsy of a highly suspicious left breast mass and left axillary lymph node.  EXAM: DIAGNOSTIC LEFT MAMMOGRAM POST ULTRASOUND BIOPSY  COMPARISON:  Previous exams  FINDINGS: Mammographic images were obtained following ultrasound guided biopsy of a highly suspicious mass in the upper left breast as well as of a highly suspicious left axillary lymph node. A ribbon shaped biopsy marking clip is present in the targeted region of the mass.  IMPRESSION: Appropriate positioning of ribbon shaped biopsy marking clip in the left breast post biopsy of a highly suspicious mass at 12 o'clock.  Final Assessment: Post Procedure Mammograms for Marker Placement   Electronically Signed   By: Everlean Alstrom M.D.   On: 02/22/2014 12:33   US Breast Ltd Uni Left Inc Axilla  02/20/2014    CLINICAL DATA:  63 year old female with a left breast palpable abnormality. The patient had a previously seen probably benign asymmetry in the left breast which she failed to follow-up.  EXAM: DIGITAL DIAGNOSTIC  LEFT  MAMMOGRAM WITH CAD  ULTRASOUND LEFT BREAST  COMPARISON:  Previous exams.  ACR Breast Density Category b: There are scattered areas of fibroglandular density.  FINDINGS: There is a high density mass in the outer left breast with associated distortion measuring up to 3.7 cm and confirmed on the additional spot compression tangential view. This corresponds to the site of palpable concern in the left breast. In addition, there are morphologically abnormal lymph nodes in the left axilla.  Mammographic images were processed with CAD.  Physical examination at site of palpable concern in the left breast reveals a firm mass at the approximate 12 to 1 o'clock position.  Targeted ultrasound of the left breast was performed demonstrating an irregular hypoechoic mass at 1 o'clock 2 cm from the nipple measuring 2.6 x 2.5 x 2.6 cm. This corresponds with mammography findings.  Enlarged and morphologically abnormal lymph nodes are seen in the left axilla, the largest of which measures 3 x 2.1 x 2 cm.  IMPRESSION: Highly suspicious left breast mass and left axillary lymphadenopathy.  RECOMMENDATION: Ultrasound-guided biopsy of the mass in the left breast as well as the enlarged left axillary lymph node is recommended. This is scheduled for Wednesday June 10th at 11:30 a.m.  I have discussed the findings and recommendations with the patient. Results were also provided in writing at the conclusion of the visit. If applicable, a reminder letter will be sent to the patient regarding the next appointment.  BI-RADS CATEGORY  5: Highly suggestive of malignancy.   Electronically Signed   By: Everlean Alstrom M.D.   On: 02/20/2014 13:42   [place] Lt Breast Bx W Loc Dev 1st Lesion Img Bx Spec US Guide  02/23/2014   ADDENDUM REPORT:  02/23/2014 16:25  ADDENDUM: THE PATHOLOGY ASSOCIATED WITH THE LEFT BREAST ULTRASOUND-GUIDED CORE BIOPSY DEMONSTRATED GRADE 3 INVASIVE DUCTAL CARCINOMA. THE PATHOLOGY ASSOCIATED WITH THE LEFT AXILLARY LYMPH NODE ULTRASOUND-GUIDED CORE BIOPSY DEMONSTRATED METASTATIC CARCINOMA WITHIN THE LYMPH NODE. THE PATHOLOGY IS CONCORDANT WITH THE IMAGING FINDINGS. I HAVE DISCUSSED FINDINGS WITH THE PATIENT BY TELEPHONE AND ANSWERED HER QUESTIONS. PATIENT STATES HER BIOPSY SITES ARE CLEAN AND DRY WITHOUT HEMATOMA FORMATION OR SIGNS OF INFECTION. THERE IS MILD TENDERNESS. THE PATIENT IS SCHEDULED FOR THE BREAST [facility] [place] ON 06/17 2015. THE PATIENT IS ALSO SCHEDULED FOR BREAST MRI ON 03/01/2014 AT 7:15 A.M. POST BIOPSY WOUND CARE INSTRUCTIONS WERE REVIEWED WITH THE PATIENT. THE PATIENT WAS ENCOURAGED TO CALL OUR BREAST CENTER FOR ADDITIONAL QUESTIONS OR CONCERNS.   Electronically Signed   By: Luberta Robertson M.D.   On: 02/23/2014 16:25   02/23/2014   CLINICAL DATA:  Highly suspicious left breast mass and left axillary lymph node.  EXAM: ULTRASOUND GUIDED LEFT BREAST CORE NEEDLE BIOPSY  COMPARISON:  Previous exams.  PROCEDURE: I met with the patient and we discussed the procedure of ultrasound-guided biopsy, including benefits and alternatives. We discussed the high likelihood of a successful procedure. We discussed the risks of the procedure including infection, bleeding, tissue injury, clip migration, and inadequate sampling. Informed written consent was given. The usual time-out protocol was performed immediately prior to the procedure.  1. Using sterile technique and 2% Lidocaine as local anesthetic, under direct ultrasound visualization, a 12 gauge vacuum-assisteddevice was used to perform biopsy of highly suspicious mass in the left breast at 12 o'clock using a lateral approach. At the conclusion of the procedure, a ribbon shaped tissue marker clip was deployed into the biopsy cavity. Follow-up 2-view  mammogram was performed  and dictated separately.  2. Using sterile technique and 2% Lidocaine as local anesthetic, under direct ultrasound visualization, a 12 gauge vacuum-assisteddevice was used to perform biopsy of the enlarged and morphologically abnormal lymph node in the low left axilla using a lateral approach.  IMPRESSION: Ultrasound-guided biopsy of a highly suspicious mass in the left breast as well as of a highly suspicious left axillary lymph node. No apparent complications.  Electronically Signed: By: Everlean Alstrom M.D. On: 02/22/2014 12:09   [place] Lt Breast Bx W Loc Dev Ea Add Lesion Img Bx Spec US Guide  02/23/2014   ADDENDUM REPORT: 02/23/2014 16:25  ADDENDUM: THE PATHOLOGY ASSOCIATED WITH THE LEFT BREAST ULTRASOUND-GUIDED CORE BIOPSY DEMONSTRATED GRADE 3 INVASIVE DUCTAL CARCINOMA. THE PATHOLOGY ASSOCIATED WITH THE LEFT AXILLARY LYMPH NODE ULTRASOUND-GUIDED CORE BIOPSY DEMONSTRATED METASTATIC CARCINOMA WITHIN THE LYMPH NODE. THE PATHOLOGY IS CONCORDANT WITH THE IMAGING FINDINGS. I HAVE DISCUSSED FINDINGS WITH THE PATIENT BY TELEPHONE AND ANSWERED HER QUESTIONS. PATIENT STATES HER BIOPSY SITES ARE CLEAN AND DRY WITHOUT HEMATOMA FORMATION OR SIGNS OF INFECTION. THERE IS MILD TENDERNESS. THE PATIENT IS SCHEDULED FOR THE BREAST Lopez [place] ON 06/17 2015. THE PATIENT IS ALSO SCHEDULED FOR BREAST MRI ON 03/01/2014 AT 7:15 A.M. POST BIOPSY WOUND CARE INSTRUCTIONS WERE REVIEWED WITH THE PATIENT. THE PATIENT WAS ENCOURAGED TO CALL OUR BREAST CENTER FOR ADDITIONAL QUESTIONS OR CONCERNS.   Electronically Signed   By: Luberta Robertson M.D.   On: 02/23/2014 16:25   02/23/2014   CLINICAL DATA:  Highly suspicious left breast mass and left axillary lymph node.  EXAM: ULTRASOUND GUIDED LEFT BREAST CORE NEEDLE BIOPSY  COMPARISON:  Previous exams.  PROCEDURE: I met with the patient and we discussed the procedure of ultrasound-guided biopsy, including benefits and alternatives. We discussed the  high likelihood of a successful procedure. We discussed the risks of the procedure including infection, bleeding, tissue injury, clip migration, and inadequate sampling. Informed written consent was given. The usual time-out protocol was performed immediately prior to the procedure.  1. Using sterile technique and 2% Lidocaine as local anesthetic, under direct ultrasound visualization, a 12 gauge vacuum-assisteddevice was used to perform biopsy of highly suspicious mass in the left breast at 12 o'clock using a lateral approach. At the conclusion of the procedure, a ribbon shaped tissue marker clip was deployed into the biopsy cavity. Follow-up 2-view mammogram was performed and dictated separately.  2. Using sterile technique and 2% Lidocaine as local anesthetic, under direct ultrasound visualization, a 12 gauge vacuum-assisteddevice was used to perform biopsy of the enlarged and morphologically abnormal lymph node in the low left axilla using a lateral approach.  IMPRESSION: Ultrasound-guided biopsy of a highly suspicious mass in the left breast as well as of a highly suspicious left axillary lymph node. No apparent complications.  Electronically Signed: By: Everlean Alstrom M.D. On: 02/22/2014 12:09    ASSESSMENT: 63 y.o. [place] woman s/p breast and left axillary lymph node biopsy 02/22/2014, both positive for a clinical T2 N2, stage IIIA invasive ductal carcinoma, grade 3, triple negative, with an MIB-1 of 90%  (1) to receive neoadjuvant chemotherapy with cyclophosphamide and doxorubicin in dose dense fashion x4, with Neulasta support on day 2, followed by weekly carboplatin and paclitaxel x12  (2) definitive surgery to follow chemotherapy  (3) radiation to follow surgery  (4) remote history of partial colectomy for early stage colon Lopez incidentally found during T[facility]/BSO in the 1980s  (5) genetics testing pending  PLAN: We spent the better part  of today's hour-long appointment discussing  the biology of breast Lopez in general, and the specifics of the patient's tumor in particular. Lanasia understands from a systemic therapy point of view, her only option is chemotherapy. Her Lopez would not be expected to benefit from antiestrogen or anti-HER-2 treatment.  We discussed the fact that starting with chemotherapy and proceeding to surgery does not affect survival as compared to starting with surgery and then proceeding to chemotherapy. In your case we are starting with chemotherapy partly in hopes that we might be able to save her breast, but more importantly to give her time to get genetic testing done and because neoadjuvant chemotherapy may make it possible for her to participate in our local treatment studies (which might give her the opportunity to safely avoid unnecessary treatments).  [place] will have a port placed, have staging scans, have an echocardiogram, and come to chemotherapy school within the next week. She will see Dr.Livesay 03/10/2014 to review results of those tests and to discuss antinausea and other supportive medications. Incidentally even if we found some evidence of metastatic disease I would proceed with the current plan of treatment.  The patient will then come for day 8 visit to assess her tolerance of treatment and to make any adjustments needed in her chemotherapy or supportive medications. She will have her second cycle July 13 and in brief she will be seen here pretty much in a weekly basis for the next 20 weeks.  The patient has a good understanding of the overall plan. She agrees with it. She knows the goal of treatment in her case is cure. She will call with any problems that may develop before her next visit here.  Chauncey Cruel, MD   03/01/2014 3:23 PM

## 2014-03-01 NOTE — Progress Notes (Signed)
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 female who is seen out courtesy of Dr. Rolm Bookbinder as part of the multidisciplinary breast clinic. Earlier this year on routine self-examination the patient palpated a mass in the upper outer aspect of her left breast. She immediately sought attention from her primary care physician Dr. Baird Cancer who confirmed a palpable mass. Patient underwent imaging which revealed a dense mass within the upper-outer quadrant of the left breast measuring approximately 3.7 cm. In addition there was suspicious left axillary adenopathy. On ultrasound the mass measured approximately 3.6 cm. The abnormal lymph nodes in the left axilla were noted the largest measuring approximately 3 cm. Patient she did undergo biopsy of the left breast mass which revealed invasive ductal carcinoma, grade 3, triple negative with a proliferation rate of 90%. Biopsy of one of the lymph nodes revealed metastatic disease. A subsequent MRI was performed which revealed a 3.8 cm enhancing mass in the left upper outer breast. A biopsy tract was extending from this area which in combined measure approximately 5.3 cm. There multiple abnormally enlarged left axillary lymph nodes, levels 1 and 2. With this information the patient is now seen for consultation.  PREVIOUS RADIATION THERAPY: No  PAST MEDICAL HISTORY:  has a past medical history of Colon cancer and rotator cuff surgery.    PAST SURGICAL HISTORY: Past Surgical History  Procedure Laterality Date  . Abdominal hysterectomy    . Colon surgery      colectomy/colostomy with  takedown    FAMILY HISTORY: family history includes Breast cancer in her mother.  SOCIAL HISTORY:  reports that she has never smoked. She does not have any smokeless tobacco history on file. She reports that she does not drink alcohol or use illicit drugs. she is divorced. She retired in 2008. Patient is accompanied by her daughter Chyla Schlender who works for [employer] in physician credentialing.  ALLERGIES: Review of patient's allergies indicates no known allergies.  MEDICATIONS:  No current outpatient prescriptions on file.   No current facility-administered medications for this encounter.    REVIEW OF SYSTEMS:  A 15 point review of systems is documented in the electronic medical record. This was obtained by the nursing staff. However, I reviewed this with the patient to discuss relevant findings and make appropriate changes. Palpable left breast mass as above some associated discomfort. She denies any nipple discharge or bleeding prior to diagnosis. Patient denies any problems with swelling in her left arm or hand. She does have a right shoulder discomfort related to prior replacement and associated  surgery. Patient was placed on disability from her work at [employer] in light of this issue.   PHYSICAL EXAM:  This is a very pleasant 63 year old female in no acute distress. Examination of the neck and supraclavicular region reveals no evidence of adenopathy. The right axilla is free of adenopathy. The left axilla reveals palpable lymph 2-3  each measuring approximately 1-1/2-2 cm in size. Examination of the right breast reveals no palpable mass or nipple discharge. Examination of the left breast reveals a large palpable mass in upper-outer quadrant of the left breast measuring approximately 8  x 6 cm in size. No nipple involvement, discharge or bleeding  the lungs are clear. The heart has a regular rhythm and rate  ECOG = 1  1 - Symptomatic but completely ambulatory (Restricted in  physically strenuous activity but ambulatory and able to carry out work of a light or sedentary nature. For example, light housework, office work)  LABORATORY DATA:  Lab Results  Component Value Date   WBC 6.6 03/01/2014   HGB 12.1 03/01/2014   HCT 37.4 03/01/2014   MCV 88.6 03/01/2014   PLT 300 03/01/2014   NEUTROABS 3.9 03/01/2014   Lab Results  Component Value Date   NA 145 03/01/2014   K 3.9 03/01/2014   CL 107 04/13/2010   CO2 26 03/01/2014   GLUCOSE 92 03/01/2014   CREATININE 1.4* 03/01/2014   CALCIUM 9.3 03/01/2014      RADIOGRAPHY: Mr Breast Bilateral W Wo Contrast  03/01/2014   CLINICAL DATA:  Recently diagnosed left breast invasive ductal carcinoma and metastatic left axillary lymph node.  LABS:  BUN and creatinine were obtained on site at [facility] at  [address]  Results:  BUN 9 mg/dL,  Creatinine 1.0 mg/dL.  EXAM: BILATERAL BREAST MRI WITH AND WITHOUT CONTRAST  TECHNIQUE: Multiplanar, multisequence MR images of both breasts were obtained prior to and following the intravenous administration of 79m of MultiHance.  THREE-DIMENSIONAL MR IMAGE RENDERING ON INDEPENDENT WORKSTATION:  Three-dimensional MR images were rendered by post-processing of the original MR data on an independent workstation. The three-dimensional MR images were interpreted, and findings are reported in the following complete MRI report for this study. Three dimensional images were evaluated at the independent DynaCad workstation  COMPARISON:  Recent mammogram, ultrasound and biopsy examinations.  FINDINGS: Breast composition: b.  Scattered fibroglandular tissue.  Background parenchymal enhancement: Minimal  Right breast: No mass or abnormal enhancement.  Left breast: 3.8 x 3.6 x 2.9 cm rounded enhancing mass with areas of nonenhancement compatible with necrosis in the 12 o'clock position of the left breast, containing a biopsy marker clip artifact. There is also enhancement surrounding the biopsy tract  extending lateral to the mass. The tract and mass combined measure 5.3 cm in maximum diameter. The mass and enhancement surrounding the biopsy tract have persistent and plateau enhancement kinetics. No additional masses or areas of abnormal enhancement suspicious for malignancy in the left breast.  Lymph nodes: Multiple abnormally enlarged, rounded left axillary lymph nodes with loss of the normal fatty hila. These include level 1 and retropectoral lymph nodes.  Ancillary findings:  None.  IMPRESSION: 1. 3.8 x 3.6 x 2.9 cm biopsy-proven invasive ductal carcinoma in the 12 o'clock position of the left breast with adjacent post biopsy changes extending laterally, with a combined maximum diameter of 5.3 cm. 2. Multiple metastatic Level 1 and Level 2 left axillary lymph nodes. 3. No evidence of malignancy on the right.  RECOMMENDATION: Treatment plan.  BI-RADS CATEGORY  6: Known biopsy-proven malignancy.   Electronically Signed   By: SEnrique SackM.D.   On: 03/01/2014 09:56   Mm Digital Diagnostic Bilat  02/20/2014   CLINICAL DATA:  63year old female with a left breast palpable abnormality. The patient had a previously seen probably benign asymmetry in the left breast which she failed to follow-up.  EXAM: DIGITAL DIAGNOSTIC  LEFT MAMMOGRAM WITH CAD  ULTRASOUND LEFT BREAST  COMPARISON:  Previous exams.  ACR Breast Density Category b: There are scattered areas of fibroglandular density.  FINDINGS: There is a  high density mass in the outer left breast with associated distortion measuring up to 3.7 cm and confirmed on the additional spot compression tangential view. This corresponds to the site of palpable concern in the left breast. In addition, there are morphologically abnormal lymph nodes in the left axilla.  Mammographic images were processed with CAD.  Physical examination at site of palpable concern in the left breast reveals a firm mass at the approximate 12 to 1 o'clock position.  Targeted ultrasound of the left  breast was performed demonstrating an irregular hypoechoic mass at 1 o'clock 2 cm from the nipple measuring 2.6 x 2.5 x 2.6 cm. This corresponds with mammography findings.  Enlarged and morphologically abnormal lymph nodes are seen in the left axilla, the largest of which measures 3 x 2.1 x 2 cm.  IMPRESSION: Highly suspicious left breast mass and left axillary lymphadenopathy.  RECOMMENDATION: Ultrasound-guided biopsy of the mass in the left breast as well as the enlarged left axillary lymph node is recommended. This is scheduled for Wednesday June 10th at 11:30 a.m.  I have discussed the findings and recommendations with the patient. Results were also provided in writing at the conclusion of the visit. If applicable, a reminder letter will be sent to the patient regarding the next appointment.  BI-RADS CATEGORY  5: Highly suggestive of malignancy.   Electronically Signed   By: Everlean Alstrom M.D.   On: 02/20/2014 13:42   Mm Digital Diagnostic Unilat L  02/22/2014   CLINICAL DATA:  Post biopsy of a highly suspicious left breast mass and left axillary lymph node.  EXAM: DIAGNOSTIC LEFT MAMMOGRAM POST ULTRASOUND BIOPSY  COMPARISON:  Previous exams  FINDINGS: Mammographic images were obtained following ultrasound guided biopsy of a highly suspicious mass in the upper left breast as well as of a highly suspicious left axillary lymph node. A ribbon shaped biopsy marking clip is present in the targeted region of the mass.  IMPRESSION: Appropriate positioning of ribbon shaped biopsy marking clip in the left breast post biopsy of a highly suspicious mass at 12 o'clock.  Final Assessment: Post Procedure Mammograms for Marker Placement   Electronically Signed   By: Everlean Alstrom M.D.   On: 02/22/2014 12:33   US Breast Ltd Uni Left Inc Axilla  02/20/2014   CLINICAL DATA:  63 year old female with a left breast palpable abnormality. The patient had a previously seen probably benign asymmetry in the left breast which  she failed to follow-up.  EXAM: DIGITAL DIAGNOSTIC  LEFT MAMMOGRAM WITH CAD  ULTRASOUND LEFT BREAST  COMPARISON:  Previous exams.  ACR Breast Density Category b: There are scattered areas of fibroglandular density.  FINDINGS: There is a high density mass in the outer left breast with associated distortion measuring up to 3.7 cm and confirmed on the additional spot compression tangential view. This corresponds to the site of palpable concern in the left breast. In addition, there are morphologically abnormal lymph nodes in the left axilla.  Mammographic images were processed with CAD.  Physical examination at site of palpable concern in the left breast reveals a firm mass at the approximate 12 to 1 o'clock position.  Targeted ultrasound of the left breast was performed demonstrating an irregular hypoechoic mass at 1 o'clock 2 cm from the nipple measuring 2.6 x 2.5 x 2.6 cm. This corresponds with mammography findings.  Enlarged and morphologically abnormal lymph nodes are seen in the left axilla, the largest of which measures 3 x 2.1 x 2 cm.  IMPRESSION:  Highly suspicious left breast mass and left axillary lymphadenopathy.  RECOMMENDATION: Ultrasound-guided biopsy of the mass in the left breast as well as the enlarged left axillary lymph node is recommended. This is scheduled for Wednesday June 10th at 11:30 a.m.  I have discussed the findings and recommendations with the patient. Results were also provided in writing at the conclusion of the visit. If applicable, a reminder letter will be sent to the patient regarding the next appointment.  BI-RADS CATEGORY  5: Highly suggestive of malignancy.   Electronically Signed   By: Everlean Alstrom M.D.   On: 02/20/2014 13:42   [place] Lt Breast Bx W Loc Dev 1st Lesion Img Bx Spec US Guide  02/23/2014   ADDENDUM REPORT: 02/23/2014 16:25  ADDENDUM: THE PATHOLOGY ASSOCIATED WITH THE LEFT BREAST ULTRASOUND-GUIDED CORE BIOPSY DEMONSTRATED GRADE 3 INVASIVE DUCTAL CARCINOMA. THE  PATHOLOGY ASSOCIATED WITH THE LEFT AXILLARY LYMPH NODE ULTRASOUND-GUIDED CORE BIOPSY DEMONSTRATED METASTATIC CARCINOMA WITHIN THE LYMPH NODE. THE PATHOLOGY IS CONCORDANT WITH THE IMAGING FINDINGS. I HAVE DISCUSSED FINDINGS WITH THE PATIENT BY TELEPHONE AND ANSWERED HER QUESTIONS. PATIENT STATES HER BIOPSY SITES ARE CLEAN AND DRY WITHOUT HEMATOMA FORMATION OR SIGNS OF INFECTION. THERE IS MILD TENDERNESS. THE PATIENT IS SCHEDULED FOR THE BREAST CANCER [place] ON 06/17 2015. THE PATIENT IS ALSO SCHEDULED FOR BREAST MRI ON 03/01/2014 AT 7:15 A.M. POST BIOPSY WOUND CARE INSTRUCTIONS WERE REVIEWED WITH THE PATIENT. THE PATIENT WAS ENCOURAGED TO CALL OUR BREAST CENTER FOR ADDITIONAL QUESTIONS OR CONCERNS.   Electronically Signed   By: Luberta Robertson M.D.   On: 02/23/2014 16:25   02/23/2014   CLINICAL DATA:  Highly suspicious left breast mass and left axillary lymph node.  EXAM: ULTRASOUND GUIDED LEFT BREAST CORE NEEDLE BIOPSY  COMPARISON:  Previous exams.  PROCEDURE: I met with the patient and we discussed the procedure of ultrasound-guided biopsy, including benefits and alternatives. We discussed the high likelihood of a successful procedure. We discussed the risks of the procedure including infection, bleeding, tissue injury, clip migration, and inadequate sampling. Informed written consent was given. The usual time-out protocol was performed immediately prior to the procedure.  1. Using sterile technique and 2% Lidocaine as local anesthetic, under direct ultrasound visualization, a 12 gauge vacuum-assisteddevice was used to perform biopsy of highly suspicious mass in the left breast at 12 o'clock using a lateral approach. At the conclusion of the procedure, a ribbon shaped tissue marker clip was deployed into the biopsy cavity. Follow-up 2-view mammogram was performed and dictated separately.  2. Using sterile technique and 2% Lidocaine as local anesthetic, under direct ultrasound visualization, a 12  gauge vacuum-assisteddevice was used to perform biopsy of the enlarged and morphologically abnormal lymph node in the low left axilla using a lateral approach.  IMPRESSION: Ultrasound-guided biopsy of a highly suspicious mass in the left breast as well as of a highly suspicious left axillary lymph node. No apparent complications.  Electronically Signed: By: Everlean Alstrom M.D. On: 02/22/2014 12:09   [place] Lt Breast Bx W Loc Dev Ea Add Lesion Img Bx Spec US Guide  02/23/2014   ADDENDUM REPORT: 02/23/2014 16:25  ADDENDUM: THE PATHOLOGY ASSOCIATED WITH THE LEFT BREAST ULTRASOUND-GUIDED CORE BIOPSY DEMONSTRATED GRADE 3 INVASIVE DUCTAL CARCINOMA. THE PATHOLOGY ASSOCIATED WITH THE LEFT AXILLARY LYMPH NODE ULTRASOUND-GUIDED CORE BIOPSY DEMONSTRATED METASTATIC CARCINOMA WITHIN THE LYMPH NODE. THE PATHOLOGY IS CONCORDANT WITH THE IMAGING FINDINGS. I HAVE DISCUSSED FINDINGS WITH THE PATIENT BY TELEPHONE AND ANSWERED HER QUESTIONS. PATIENT STATES HER BIOPSY SITES  ARE CLEAN AND DRY WITHOUT HEMATOMA FORMATION OR SIGNS OF INFECTION. THERE IS MILD TENDERNESS. THE PATIENT IS SCHEDULED FOR THE BREAST CANCER [facility] ON 06/17 2015. THE PATIENT IS ALSO SCHEDULED FOR BREAST MRI ON 03/01/2014 AT 7:15 A.M. POST BIOPSY WOUND CARE INSTRUCTIONS WERE REVIEWED WITH THE PATIENT. THE PATIENT WAS ENCOURAGED TO CALL OUR BREAST CENTER FOR ADDITIONAL QUESTIONS OR CONCERNS.   Electronically Signed   By: Luberta Robertson M.D.   On: 02/23/2014 16:25   02/23/2014   CLINICAL DATA:  Highly suspicious left breast mass and left axillary lymph node.  EXAM: ULTRASOUND GUIDED LEFT BREAST CORE NEEDLE BIOPSY  COMPARISON:  Previous exams.  PROCEDURE: I met with the patient and we discussed the procedure of ultrasound-guided biopsy, including benefits and alternatives. We discussed the high likelihood of a successful procedure. We discussed the risks of the procedure including infection, bleeding, tissue injury, clip migration, and inadequate  sampling. Informed written consent was given. The usual time-out protocol was performed immediately prior to the procedure.  1. Using sterile technique and 2% Lidocaine as local anesthetic, under direct ultrasound visualization, a 12 gauge vacuum-assisteddevice was used to perform biopsy of highly suspicious mass in the left breast at 12 o'clock using a lateral approach. At the conclusion of the procedure, a ribbon shaped tissue marker clip was deployed into the biopsy cavity. Follow-up 2-view mammogram was performed and dictated separately.  2. Using sterile technique and 2% Lidocaine as local anesthetic, under direct ultrasound visualization, a 12 gauge vacuum-assisteddevice was used to perform biopsy of the enlarged and morphologically abnormal lymph node in the low left axilla using a lateral approach.  IMPRESSION: Ultrasound-guided biopsy of a highly suspicious mass in the left breast as well as of a highly suspicious left axillary lymph node. No apparent complications.  Electronically Signed: By: Everlean Alstrom M.D. On: 02/22/2014 12:09      IMPRESSION: Clinical stage III, high-grade, triple-negative left breast cancer. The patient would be a good candidate for neoadjuvant treatment in an attempt to shrink her tumor and with good response  undergo breast conserving therapy.  In light of  The patient's stage would recommend radiation therapy as part of her overall management, either as breast conservation therapy or postmastectomy. She may be a potential candidate for local protocol study.  PLAN: Neoadjuvant chemotherapy followed by surgery then radiation treatments. Patient would appear to be a good candidate for genetic testing light of her prior history of early stage colon cancer and family history of mother with breast cancer diagnosed in her 42s.    ------------------------------------------------  Blair Promise, PhD, MD

## 2014-03-01 NOTE — Progress Notes (Signed)
[name], female   DOB: [birth year], 63 y.o.   MRN: [id number]  Chief Complaint  Patient presents with  . Other    HPI [name] is a 63 y.o. female.  Referred by Dr Luberta Robertson HPI 75 yof who noted a left breast mass in May.  She had no discharge and no other breast complaints. She has no history of breast disease.  Her mom had breast cancer in 62s or 78s and is still alive in her 33s.  She has a personal history of colorectal cancer in the 96s.  She underwent mm and US showing a left breast mass.  [place] has a 2.6x2.5x2.6 cm mass present and multiple enlarged axillary nodes.  MRI has a 3.8x3.6x2.9 cm lesion with some adjacent post biopsy changes in the lateral breast with combined diameter of 5.3 cm.  There are multiple metastatic level one and two nodes.  The right is normal.  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  The node is positive.  Past Medical History  Diagnosis Date  . Colon cancer   . Hx of rotator cuff surgery     Past Surgical History  Procedure Laterality Date  . Abdominal hysterectomy    . Colon surgery      colectomy/colostomy with takedown    Family History  Problem Relation Age of Onset  . Breast cancer Mother     Social History History  Substance Use Topics  . Smoking status: Never Smoker   . Smokeless tobacco: Not on file  . Alcohol Use: No    No Known Allergies  No current outpatient prescriptions on file.   No current facility-administered medications for this visit.    Review of Systems Review of Systems  Constitutional: Negative for fever, chills and unexpected weight change.  HENT: Negative for congestion, hearing loss, sore throat, trouble swallowing and voice change.   Eyes: Negative for visual disturbance.  Respiratory: Negative for cough and wheezing.   Cardiovascular: Negative for chest pain, palpitations and leg swelling.  Gastrointestinal: Negative  for nausea, vomiting, abdominal pain, diarrhea, constipation, blood in stool, abdominal distention and anal bleeding.  Genitourinary: Negative for hematuria, vaginal bleeding and difficulty urinating.  Musculoskeletal: Negative for arthralgias.  Skin: Negative for rash and wound.  Neurological: Negative for seizures, syncope and headaches.  Hematological: Negative for adenopathy. Does not bruise/bleed easily.  Psychiatric/Behavioral: Negative for confusion.    There were no vitals taken for this visit.  Physical Exam Physical Exam  Vitals reviewed. Constitutional: She appears well-developed and well-nourished.  Neck: Neck supple.  Cardiovascular: Normal rate, regular rhythm and normal heart sounds.   Pulmonary/Chest: Effort normal and breath sounds normal. She has no wheezes. She has no rales. Right breast exhibits no inverted nipple, no mass, no nipple discharge, no skin change and no tenderness. Left breast exhibits mass. Left breast exhibits no inverted nipple, no nipple discharge and no skin change.    Abdominal: Soft. Normal appearance. She exhibits no distension.    Lymphadenopathy:    She has no cervical adenopathy.    She has axillary adenopathy.       Right axillary: No pectoral and no lateral adenopathy present.       Left axillary: Pectoral and lateral adenopathy present.       Right: No supraclavicular adenopathy present.       Left: No supraclavicular adenopathy present.    Data Reviewed EXAM:  BILATERAL BREAST MRI WITH AND WITHOUT CONTRAST  TECHNIQUE:  Multiplanar, multisequence MR images of both breasts were obtained  prior to and following the intravenous administration of 55ml of  MultiHance.  THREE-DIMENSIONAL MR IMAGE RENDERING ON INDEPENDENT WORKSTATION:  Three-dimensional MR images were rendered by post-processing of the  original MR data on an independent workstation. The  three-dimensional MR images were interpreted, and findings are  reported in the  following complete MRI report for this study. Three  dimensional images were evaluated at the independent DynaCad  workstation  COMPARISON: Recent mammogram, ultrasound and biopsy examinations.  FINDINGS:  Breast composition: b. Scattered fibroglandular tissue.  Background parenchymal enhancement: Minimal  Right breast: No mass or abnormal enhancement.  Left breast: 3.8 x 3.6 x 2.9 cm rounded enhancing mass with areas of  nonenhancement compatible with necrosis in the 12 o'clock position  of the left breast, containing a biopsy marker clip artifact. There  is also enhancement surrounding the biopsy tract extending lateral  to the mass. The tract and mass combined measure 5.3 cm in maximum  diameter. The mass and enhancement surrounding the biopsy tract have  persistent and plateau enhancement kinetics. No additional masses or  areas of abnormal enhancement suspicious for malignancy in the left  breast.  Lymph nodes: Multiple abnormally enlarged, rounded left axillary  lymph nodes with loss of the normal fatty hila. These include level  1 and retropectoral lymph nodes.  Ancillary findings: None.  IMPRESSION:  1. 3.8 x 3.6 x 2.9 cm biopsy-proven invasive ductal carcinoma in the  12 o'clock position of the left breast with adjacent post biopsy  changes extending laterally, with a combined maximum diameter of 5.3  cm.  2. Multiple metastatic Level 1 and Level 2 left axillary lymph  nodes.  3. No evidence of malignancy on the right.    Assessment    Locally advanced left breast cancer    Plan    Primary systemic chemotherapy  She has at least stage II disease right now.  We discussed the staging and pathophysiology of breast cancer. We discussed her tumor being a triple negative tumor. We discussed all of the treatments for breast cancer including surgery, chemotherapy, radiation therapy, herceptin and antiestrogen therapy. At this point she would need an MRM. I think she needs  staging and genetic testing. She has two daughters, personal history of crc and breast cancer and family history of breast cancer.   I think she would benefit from primary systemic therapy to try to shrink this.  She may be candidate for lumpectomy if she gets a good response.  She may also be able to participate in the alliance trial for sn if her nodes are clinically negative. She will get port placement and then I will see at end of chemotherapy with an mri.        WAKEFIELD,MATTHEW 03/01/2014, 2:02 PM

## 2014-03-01 NOTE — Telephone Encounter (Signed)
, °

## 2014-03-01 NOTE — Telephone Encounter (Signed)
Per staff phone call and POF I have schedueld appts. Scheduler advised of appts.  JMW  

## 2014-03-02 ENCOUNTER — Telehealth: Payer: Self-pay | Admitting: Oncology

## 2014-03-02 ENCOUNTER — Other Ambulatory Visit: Payer: Self-pay | Admitting: Radiology

## 2014-03-02 ENCOUNTER — Other Ambulatory Visit: Payer: Self-pay | Admitting: Oncology

## 2014-03-02 NOTE — Progress Notes (Signed)
[place] Work  Clinical Social Work met with pt and her daughter at [facility] appointment to review distress screen and for assessment of psychosocialneeds.  Pt denied being overly distressed about her current situation. The patient scored a 2 on the Psychosocial Distress Thermometer which indicates no distress. CSW explained the distress screen and how pt's distress may increase or decrease over her treatment course. CSW educated pt and daughter about resources available to assist with the emotional aspects of breast cancer. CSW provided them with handouts and [facility] Support Team calendar. Pt and daughter were very appreciative. They both feel they got their questions answered today. Pt declines Alight Guide currently, but is aware of the program. They both are aware of how to reach out to CSW as needed.   ONCBCN DISTRESS SCREENING 03/01/2014  Screening Type Initial Screening  Mark the number that describes how much distress you have been experiencing in the past week 2   Clinical Social Worker follow up needed: no  Loren Racer, [place] [address] [place] for [place] Wednesday, Thursday and Friday Phone: [phone] Fax: [phone]

## 2014-03-02 NOTE — Telephone Encounter (Signed)
, °

## 2014-03-03 ENCOUNTER — Encounter (HOSPITAL_COMMUNITY): Payer: Self-pay | Admitting: Pharmacy Technician

## 2014-03-03 ENCOUNTER — Other Ambulatory Visit: Payer: Self-pay | Admitting: Radiology

## 2014-03-06 ENCOUNTER — Encounter (HOSPITAL_COMMUNITY): Payer: Self-pay

## 2014-03-06 ENCOUNTER — Ambulatory Visit: Payer: Medicare Other | Admitting: Physical Therapy

## 2014-03-06 ENCOUNTER — Ambulatory Visit (HOSPITAL_COMMUNITY)
Admission: RE | Admit: 2014-03-06 | Discharge: 2014-03-06 | Disposition: A | Discharge status: Home / Self Care | Payer: Medicare Other | Source: Ambulatory Visit | Attending: Oncology | Admitting: Oncology

## 2014-03-06 ENCOUNTER — Encounter (HOSPITAL_COMMUNITY)
Admission: RE | Admit: 2014-03-06 | Discharge: 2014-03-06 | Disposition: A | Discharge status: Home / Self Care | Payer: Medicare Other | Source: Ambulatory Visit | Attending: Oncology | Admitting: Oncology

## 2014-03-06 ENCOUNTER — Other Ambulatory Visit: Payer: Medicare Other | Admitting: Radiology

## 2014-03-06 DIAGNOSIS — I517 Cardiomegaly: Secondary | ICD-10-CM | POA: Insufficient documentation

## 2014-03-06 DIAGNOSIS — E041 Nontoxic single thyroid nodule: Secondary | ICD-10-CM | POA: Diagnosis not present

## 2014-03-06 DIAGNOSIS — C773 Secondary and unspecified malignant neoplasm of axilla and upper limb lymph nodes: Secondary | ICD-10-CM | POA: Insufficient documentation

## 2014-03-06 DIAGNOSIS — J984 Other disorders of lung: Secondary | ICD-10-CM | POA: Insufficient documentation

## 2014-03-06 DIAGNOSIS — C50919 Malignant neoplasm of unspecified site of unspecified female breast: Secondary | ICD-10-CM | POA: Insufficient documentation

## 2014-03-06 DIAGNOSIS — Z96619 Presence of unspecified artificial shoulder joint: Secondary | ICD-10-CM | POA: Diagnosis not present

## 2014-03-06 DIAGNOSIS — R599 Enlarged lymph nodes, unspecified: Secondary | ICD-10-CM | POA: Insufficient documentation

## 2014-03-06 DIAGNOSIS — K449 Diaphragmatic hernia without obstruction or gangrene: Secondary | ICD-10-CM | POA: Insufficient documentation

## 2014-03-06 DIAGNOSIS — C779 Secondary and unspecified malignant neoplasm of lymph node, unspecified: Secondary | ICD-10-CM | POA: Diagnosis not present

## 2014-03-06 DIAGNOSIS — M1610 Unilateral primary osteoarthritis, unspecified hip: Secondary | ICD-10-CM | POA: Insufficient documentation

## 2014-03-06 DIAGNOSIS — Z803 Family history of malignant neoplasm of breast: Secondary | ICD-10-CM | POA: Diagnosis not present

## 2014-03-06 DIAGNOSIS — C50412 Malignant neoplasm of upper-outer quadrant of left female breast: Secondary | ICD-10-CM

## 2014-03-06 DIAGNOSIS — M169 Osteoarthritis of hip, unspecified: Secondary | ICD-10-CM | POA: Diagnosis not present

## 2014-03-06 DIAGNOSIS — IMO0001 Reserved for inherently not codable concepts without codable children: Secondary | ICD-10-CM | POA: Diagnosis not present

## 2014-03-06 LAB — GLUCOSE, CAPILLARY: Glucose-Capillary: 86 mg/dL (ref 70–99)

## 2014-03-06 MED ORDER — IOHEXOL 300 MG/ML  SOLN
80.0000 mL | Freq: Once | INTRAMUSCULAR | Status: AC | PRN
  Administered 2014-03-06: 80 mL via INTRAVENOUS

## 2014-03-06 MED ORDER — FLUDEOXYGLUCOSE F - 18 (FDG) INJECTION
15.2000 | Freq: Once | INTRAVENOUS | Status: AC | PRN
  Administered 2014-03-06: 15.2 via INTRAVENOUS

## 2014-03-08 ENCOUNTER — Other Ambulatory Visit: Payer: Self-pay | Admitting: Oncology

## 2014-03-08 ENCOUNTER — Ambulatory Visit (HOSPITAL_COMMUNITY)
Admission: RE | Admit: 2014-03-08 | Discharge: 2014-03-08 | Disposition: A | Discharge status: Home / Self Care | Payer: Medicare Other | Source: Ambulatory Visit | Attending: Oncology | Admitting: Oncology

## 2014-03-08 ENCOUNTER — Encounter (HOSPITAL_COMMUNITY): Payer: Self-pay

## 2014-03-08 ENCOUNTER — Ambulatory Visit: Payer: Medicare Other

## 2014-03-08 ENCOUNTER — Telehealth: Payer: Self-pay | Admitting: *Deleted

## 2014-03-08 ENCOUNTER — Other Ambulatory Visit: Payer: Self-pay | Admitting: Physician Assistant

## 2014-03-08 DIAGNOSIS — C50412 Malignant neoplasm of upper-outer quadrant of left female breast: Secondary | ICD-10-CM

## 2014-03-08 DIAGNOSIS — Z452 Encounter for adjustment and management of vascular access device: Secondary | ICD-10-CM | POA: Diagnosis not present

## 2014-03-08 DIAGNOSIS — Z803 Family history of malignant neoplasm of breast: Secondary | ICD-10-CM | POA: Insufficient documentation

## 2014-03-08 DIAGNOSIS — Z85038 Personal history of other malignant neoplasm of large intestine: Secondary | ICD-10-CM | POA: Insufficient documentation

## 2014-03-08 DIAGNOSIS — Z90710 Acquired absence of both cervix and uterus: Secondary | ICD-10-CM | POA: Diagnosis not present

## 2014-03-08 DIAGNOSIS — IMO0001 Reserved for inherently not codable concepts without codable children: Secondary | ICD-10-CM | POA: Diagnosis not present

## 2014-03-08 DIAGNOSIS — C50919 Malignant neoplasm of unspecified site of unspecified female breast: Secondary | ICD-10-CM | POA: Insufficient documentation

## 2014-03-08 LAB — CBC
HEMATOCRIT: 37.1 % (ref 36.0–46.0)
Hemoglobin: 12.4 g/dL (ref 12.0–15.0)
MCH: 29.1 pg (ref 26.0–34.0)
MCHC: 33.4 g/dL (ref 30.0–36.0)
MCV: 87.1 fL (ref 78.0–100.0)
Platelets: 319 10*3/uL (ref 150–400)
RBC: 4.26 MIL/uL (ref 3.87–5.11)
RDW: 12.8 % (ref 11.5–15.5)
WBC: 4.5 10*3/uL (ref 4.0–10.5)

## 2014-03-08 MED ORDER — FENTANYL CITRATE 0.05 MG/ML IJ SOLN
INTRAMUSCULAR | Status: AC
  Filled 2014-03-08: qty 6

## 2014-03-08 MED ORDER — HEPARIN SOD (PORK) LOCK FLUSH 100 UNIT/ML IV SOLN
INTRAVENOUS | Status: DC
Start: 2014-03-08 — End: 2014-03-12
  Filled 2014-03-08: qty 10

## 2014-03-08 MED ORDER — CEFAZOLIN SODIUM-DEXTROSE 2-3 GM-% IV SOLR
2.0000 g | INTRAVENOUS | Status: AC
  Administered 2014-03-08: 2 g via INTRAVENOUS

## 2014-03-08 MED ORDER — CEFAZOLIN SODIUM-DEXTROSE 2-3 GM-% IV SOLR
INTRAVENOUS | Status: AC
  Filled 2014-03-08: qty 50

## 2014-03-08 MED ORDER — HEPARIN SOD (PORK) LOCK FLUSH 100 UNIT/ML IV SOLN
INTRAVENOUS | Status: AC | PRN
  Administered 2014-03-08: 500 [IU]

## 2014-03-08 MED ORDER — FENTANYL CITRATE 0.05 MG/ML IJ SOLN
INTRAMUSCULAR | Status: AC | PRN
  Administered 2014-03-08: 50 ug via INTRAVENOUS

## 2014-03-08 MED ORDER — SODIUM CHLORIDE 0.9 % IV SOLN
INTRAVENOUS | Status: DC
  Administered 2014-03-08: 10:00:00 via INTRAVENOUS

## 2014-03-08 MED ORDER — MIDAZOLAM HCL 2 MG/2ML IJ SOLN
INTRAMUSCULAR | Status: DC
Start: 2014-03-08 — End: 2014-03-12
  Filled 2014-03-08: qty 6

## 2014-03-08 MED ORDER — MIDAZOLAM HCL 2 MG/2ML IJ SOLN
INTRAMUSCULAR | Status: AC | PRN
  Administered 2014-03-08 (×2): 1 mg via INTRAVENOUS

## 2014-03-08 MED ORDER — LIDOCAINE HCL 1 % IJ SOLN
INTRAMUSCULAR | Status: DC
Start: 2014-03-08 — End: 2014-03-12
  Filled 2014-03-08: qty 20

## 2014-03-08 NOTE — Discharge Instructions (Signed)
Conscious Sedation °Sedation is the use of medicines to promote relaxation and relieve discomfort and anxiety. Conscious sedation is a type of sedation. Under conscious sedation you are less alert than normal but are still able to respond to instructions or stimulation. Conscious sedation is used during short medical and dental procedures. It is milder than deep sedation or general anesthesia and allows you to return to your regular activities sooner.  °LET YOUR HEALTH CARE PROVIDER KNOW ABOUT:  °· Any allergies you have. °· All medicines you are taking, including vitamins, herbs, eye drops, creams, and over-the-counter medicines. °· Use of steroids (by mouth or creams). °· Previous problems you or members of your family have had with the use of anesthetics. °· Any blood disorders you have. °· Previous surgeries you have had. °· Medical conditions you have. °· Possibility of pregnancy, if this applies. °· Use of cigarettes, alcohol, or illegal drugs. °RISKS AND COMPLICATIONS °Generally, this is a safe procedure. However, as with any procedure, problems can occur. Possible problems include: °· Oversedation. °· Trouble breathing on your own. You may need to have a breathing tube until you are awake and breathing on your own. °· Allergic reaction to any of the medicines used for the procedure. °BEFORE THE PROCEDURE °· You may have blood tests done. These tests can help show how well your kidneys and liver are working. They can also show how well your blood clots. °· A physical exam will be done.   °· Only take medicines as directed by your health care provider. You may need to stop taking medicines (such as blood thinners, aspirin, or nonsteroidal anti-inflammatory drugs) before the procedure.   °· Do not eat or drink at least 6 hours before the procedure or as directed by your health care provider. °· Arrange for a responsible adult, family member, or friend to take you home after the procedure. He or she should stay  with you for at least 24 hours after the procedure, until the medicine has worn off. °PROCEDURE  °· An intravenous (IV) catheter will be inserted into one of your veins. Medicine will be able to flow directly into your body through this catheter. You may be given medicine through this tube to help prevent pain and help you relax. °· The medical or dental procedure will be done. °AFTER THE PROCEDURE °· You will stay in a recovery area until the medicine has worn off. Your blood pressure and pulse will be checked.   °·  Depending on the procedure you had, you may be allowed to go home when you can tolerate liquids and your pain is under control. °Document Released: 05/27/2001 Document Revised: 09/06/2013 Document Reviewed: 05/09/2013 °ExitCare® Patient Information ©2015 [employer]. This information is not intended to replace advice given to you by your health care provider. Make sure you discuss any questions you have with your health care provider. °Implanted Port Home Guide °An implanted port is a type of central line that is placed under the skin. Central lines are used to provide IV access when treatment or nutrition needs to be given through a person's veins. Implanted ports are used for long-term IV access. An implanted port may be placed because:  °· You need IV medicine that would be irritating to the small veins in your hands or arms.   °· You need long-term IV medicines, such as antibiotics.   °· You need IV nutrition for a long period.   °· You need frequent blood draws for lab tests.   °· You need dialysis.   °  Implanted ports are usually placed in the chest area, but they can also be placed in the upper arm, the abdomen, or the leg. An implanted port has two main parts:  °· Reservoir. The reservoir is round and will appear as a small, raised area under your skin. The reservoir is the part where a needle is inserted to give medicines or draw blood.   °· Catheter. The catheter is a thin, flexible tube  that extends from the reservoir. The catheter is placed into a large vein. Medicine that is inserted into the reservoir goes into the catheter and then into the vein.   °HOW WILL I CARE FOR MY INCISION SITE? °Do not get the incision site wet. Bathe or shower as directed by your health care provider.  °HOW IS MY PORT ACCESSED? °Special steps must be taken to access the port:  °· Before the port is accessed, a numbing cream can be placed on the skin. This helps numb the skin over the port site.   °· Your health care provider uses a sterile technique to access the port. °¨ Your health care provider must put on a mask and sterile gloves. °¨ The skin over your port is cleaned carefully with an antiseptic and allowed to dry. °¨ The port is gently pinched between sterile gloves, and a needle is inserted into the port. °· Only "non-coring" port needles should be used to access the port. Once the port is accessed, a blood return should be checked. This helps ensure that the port is in the vein and is not clogged.   °· If your port needs to remain accessed for a constant infusion, a clear (transparent) bandage will be placed over the needle site. The bandage and needle will need to be changed every week, or as directed by your health care provider.   °· Keep the bandage covering the needle clean and dry. Do not get it wet. Follow your health care provider's instructions on how to take a shower or bath while the port is accessed.   °· If your port does not need to stay accessed, no bandage is needed over the port.   °WHAT IS FLUSHING? °Flushing helps keep the port from getting clogged. Follow your health care provider's instructions on how and when to flush the port. Ports are usually flushed with saline solution or a medicine called heparin. The need for flushing will depend on how the port is used.  °· If the port is used for intermittent medicines or blood draws, the port will need to be flushed:   °¨ After medicines have  been given.   °¨ After blood has been drawn.   °¨ As part of routine maintenance.   °· If a constant infusion is running, the port may not need to be flushed.   °HOW LONG WILL MY PORT STAY IMPLANTED? °The port can stay in for as long as your health care provider thinks it is needed. When it is time for the port to come out, surgery will be done to remove it. The procedure is similar to the one performed when the port was put in.  °WHEN SHOULD I SEEK IMMEDIATE MEDICAL CARE? °When you have an implanted port, you should seek immediate medical care if:  °· You notice a bad smell coming from the incision site.   °· You have swelling, redness, or drainage at the incision site.   °· You have more swelling or pain at the port site or the surrounding area.   °· You have a fever that is not controlled with   medicine. °Document Released: 09/01/2005 Document Revised: 06/22/2013 Document Reviewed: 05/09/2013 °ExitCare® Patient Information ©2015 [employer]. This information is not intended to replace advice given to you by your health care provider. Make sure you discuss any questions you have with your health care provider. ° °

## 2014-03-08 NOTE — Procedures (Signed)
RIJV PAC SVC RA No comp 

## 2014-03-08 NOTE — Telephone Encounter (Signed)
Per pharmacy I have scheduled in jection  

## 2014-03-08 NOTE — H&P (Signed)
Chief Complaint: "I am here for a port." Referring Physician: Dr. Jana Hakim HPI: [name] is an 63 y.o. female with left breast cancer scheduled today for an image guided port a catheter placement for chemotherapy. She denies any chest pain, shortness of breath or palpitations. She denies any active signs of bleeding or excessive bruising. She denies any recent fever or chills. The patient denies any history of sleep apnea or chronic oxygen use. She has previously tolerated sedation without complications.   Past Medical History:  Past Medical History  Diagnosis Date  . Colon cancer   . Hx of rotator cuff surgery     Past Surgical History:  Past Surgical History  Procedure Laterality Date  . Abdominal hysterectomy    . Colon surgery      colectomy/colostomy with takedown    Family History:  Family History  Problem Relation Age of Onset  . Breast cancer Mother     Social History:  reports that she has never smoked. She does not have any smokeless tobacco history on file. She reports that she does not drink alcohol or use illicit drugs.  Allergies: No Known Allergies  Medications:   Medication List    ASK your doctor about these medications       acetaminophen 500 MG tablet  Commonly known as:  TYLENOL  Take 1,000 mg by mouth every 6 (six) hours as needed for mild pain or moderate pain.       Please HPI for pertinent positives, otherwise complete 10 system ROS negative.  Physical Exam: BP 140/94  Pulse 82  Temp(Src) 97.8 F (36.6 C) (Oral)  Resp 18 There is no weight on file to calculate BMI.  General Appearance:  Alert, cooperative, no distress  Head:  Normocephalic, without obvious abnormality, atraumatic  Neck: Supple, symmetrical, trachea midline  Lungs:   Clear to auscultation bilaterally, no w/r/r, respirations unlabored without use of accessory muscles.  Chest Wall:  No tenderness or deformity  Heart:  Regular rate and rhythm, S1, S2 normal, no  murmur, rub or gallop.  Abdomen:   Soft, non-tender, non distended, (+) BS  Extremities: Extremities normal, atraumatic, no cyanosis or edema  Neurologic: Normal affect, no gross deficits.   Results for orders placed during the hospital encounter of 03/08/14 (from the past 48 hour(s))  CBC     Status: None   Collection Time    03/08/14  9:37 AM      Result Value Ref Range   WBC 4.5  4.0 - 10.5 K/uL   RBC 4.26  3.87 - 5.11 MIL/uL   Hemoglobin 12.4  12.0 - 15.0 g/dL   HCT 37.1  36.0 - 46.0 %   MCV 87.1  78.0 - 100.0 fL   MCH 29.1  26.0 - 34.0 pg   MCHC 33.4  30.0 - 36.0 g/dL   RDW 12.8  11.5 - 15.5 %   Platelets 319  150 - 400 K/uL   Recent Results (from the past 2160 hour(s))  CBC WITH DIFFERENTIAL     Status: None   Collection Time    03/01/14 12:04 PM      Result Value Ref Range   WBC 6.6  3.9 - 10.3 10e3/uL   NEUT# 3.9  1.5 - 6.5 10e3/uL   HGB 12.1  11.6 - 15.9 g/dL   HCT 37.4  34.8 - 46.6 %   Platelets 300  145 - 400 10e3/uL   MCV 88.6  79.5 - 101.0 fL  MCH 28.7  25.1 - 34.0 pg   MCHC 32.4  31.5 - 36.0 g/dL   RBC 4.22  3.70 - 5.45 10e6/uL   RDW 12.9  11.2 - 14.5 %   lymph# 2.0  0.9 - 3.3 10e3/uL   MONO# 0.5  0.1 - 0.9 10e3/uL   Eosinophils Absolute 0.1  0.0 - 0.5 10e3/uL   Basophils Absolute 0.0  0.0 - 0.1 10e3/uL   NEUT% 59.3  38.4 - 76.8 %   LYMPH% 31.0  14.0 - 49.7 %   MONO% 7.3  0.0 - 14.0 %   EOS% 2.1  0.0 - 7.0 %   BASO% 0.3  0.0 - 2.0 %  COMPREHENSIVE METABOLIC PANEL (WJ19)     Status: Abnormal   Collection Time    03/01/14 12:04 PM      Result Value Ref Range   Sodium 145  136 - 145 mEq/L   Potassium 3.9  3.5 - 5.1 mEq/L   Chloride 111 (*) 98 - 109 mEq/L   CO2 26  22 - 29 mEq/L   Glucose 92  70 - 140 mg/dl   BUN 10.7  7.0 - 26.0 mg/dL   Creatinine 1.4 (*) 0.6 - 1.1 mg/dL   Total Bilirubin 1.30 (*) 0.20 - 1.20 mg/dL   Alkaline Phosphatase 117  40 - 150 U/L   AST 10  5 - 34 U/L   ALT 10  0 - 55 U/L   Total Protein 7.4  6.4 - 8.3 g/dL   Albumin 3.5   3.5 - 5.0 g/dL   Calcium 9.3  8.4 - 10.4 mg/dL   Anion Gap 8  3 - 11 mEq/L  GLUCOSE, CAPILLARY     Status: None   Collection Time    03/06/14  9:51 AM      Result Value Ref Range   Glucose-Capillary 86  70 - 99 mg/dL  CBC     Status: None   Collection Time    03/08/14  9:37 AM      Result Value Ref Range   WBC 4.5  4.0 - 10.5 K/uL   RBC 4.26  3.87 - 5.11 MIL/uL   Hemoglobin 12.4  12.0 - 15.0 g/dL   HCT 37.1  36.0 - 46.0 %   MCV 87.1  78.0 - 100.0 fL   MCH 29.1  26.0 - 34.0 pg   MCHC 33.4  30.0 - 36.0 g/dL   RDW 12.8  11.5 - 15.5 %   Platelets 319  150 - 400 K/uL     Ct Chest W Contrast  03/06/2014   CLINICAL DATA:  New diagnosis of left-sided breast cancer.  EXAM: CT CHEST WITH CONTRAST  TECHNIQUE: Multidetector CT imaging of the chest was performed during intravenous contrast administration.  CONTRAST:  72mL OMNIPAQUE IOHEXOL 300 MG/ML  SOLN  COMPARISON:  Today's PET, dictated separately. Breast MRI of 03/01/2014. Chest CT 04/12/2010  FINDINGS: Lungs/Pleura: 4 mm right upper lobe lung nodule on image 16 is similar to image 28 of the prior chest CT, consistent with a benign etiology.  Right lower lobe 4 mm nodule on image 47/ series 5. Not readily apparent on the prior chest CT.  Favor dependent scarring in the right lower lobe more superiorly on image 31.  No pleural fluid.  Heart/Mediastinum: No supraclavicular adenopathy. No right axillary adenopathy. Left axillary adenopathy. Index node measures 2.4 x 2.3 cm on image 11.  Left paracentral deep lateral breast mass measures 3.2 x 2.9 cm on  image 14.  Bilateral low-density thyroid nodules. The dominant 2.9 cm lesion on image 7 corresponds to mild hypermetabolism on PET. Grossly similar to on the 04/12/2010 exam.  Mild cardiomegaly, without pericardial effusion. No central pulmonary embolism, on this non-dedicated study. No mediastinal or hilar adenopathy. A small hiatal hernia. Fluid level in the thoracic esophagus on image 18.  Upper  Abdomen: Normal imaged portions of the liver, spleen, distal stomach, pancreas, adrenal glands, kidneys, gallbladder, biliary tract. [place] within the splenic flexure the colon.  Bones/Musculoskeletal:  Right shoulder arthroplasty.  IMPRESSION: 1. Left breast primary with left axillary nodal metastasis. 2. Right-sided lung nodules. A lower lobe nodule is not readily apparent on the prior exam and warrants followup attention. 3. Bilateral thyroid nodules with a dominant right-sided lesion which is grossly similar in size back to 2011, but corresponds with mild hypermetabolism at PET. Consider ultrasound. 4. Small hiatal hernia. Esophageal air fluid level suggests dysmotility or gastroesophageal reflux.   Electronically Signed   By: Abigail Miyamoto M.D.   On: 03/06/2014 12:48    Assessment/Plan Left breast cancer Scheduled for a port a catheter placement today with sedation. Patient has been NPO, no blood thinners taken, labs reviewed. Risks and Benefits discussed with the patient. All of the patient's questions were answered, patient is agreeable to proceed. Consent signed and in chart.   Tsosie Billing D PA-C 03/08/2014, 11:38 AM

## 2014-03-09 ENCOUNTER — Ambulatory Visit (HOSPITAL_BASED_OUTPATIENT_CLINIC_OR_DEPARTMENT_OTHER): Payer: Medicare Other

## 2014-03-09 ENCOUNTER — Other Ambulatory Visit: Payer: Self-pay | Admitting: Oncology

## 2014-03-09 ENCOUNTER — Ambulatory Visit: Payer: Medicare Other | Admitting: Physical Therapy

## 2014-03-09 ENCOUNTER — Ambulatory Visit (HOSPITAL_COMMUNITY)
Admission: RE | Admit: 2014-03-09 | Discharge: 2014-03-09 | Disposition: A | Discharge status: Home / Self Care | Payer: Medicare Other | Source: Ambulatory Visit | Attending: Oncology | Admitting: Oncology

## 2014-03-09 ENCOUNTER — Encounter: Payer: Self-pay | Admitting: *Deleted

## 2014-03-09 DIAGNOSIS — C50919 Malignant neoplasm of unspecified site of unspecified female breast: Secondary | ICD-10-CM | POA: Insufficient documentation

## 2014-03-09 DIAGNOSIS — I519 Heart disease, unspecified: Secondary | ICD-10-CM

## 2014-03-09 DIAGNOSIS — Z01810 Encounter for preprocedural cardiovascular examination: Secondary | ICD-10-CM | POA: Insufficient documentation

## 2014-03-09 DIAGNOSIS — Z803 Family history of malignant neoplasm of breast: Secondary | ICD-10-CM | POA: Diagnosis not present

## 2014-03-09 DIAGNOSIS — C50412 Malignant neoplasm of upper-outer quadrant of left female breast: Secondary | ICD-10-CM

## 2014-03-09 DIAGNOSIS — IMO0001 Reserved for inherently not codable concepts without codable children: Secondary | ICD-10-CM | POA: Diagnosis not present

## 2014-03-09 NOTE — Progress Notes (Signed)
  Echocardiogram 2D Echocardiogram has been performed.  [place], [place] 03/09/2014, 10:02 AM

## 2014-03-10 ENCOUNTER — Encounter: Payer: Medicare Other | Admitting: Physical Therapy

## 2014-03-10 ENCOUNTER — Other Ambulatory Visit: Payer: Medicare Other

## 2014-03-10 ENCOUNTER — Encounter: Payer: Self-pay | Admitting: Oncology

## 2014-03-10 ENCOUNTER — Ambulatory Visit (HOSPITAL_BASED_OUTPATIENT_CLINIC_OR_DEPARTMENT_OTHER): Payer: Medicare Other | Admitting: Oncology

## 2014-03-10 ENCOUNTER — Other Ambulatory Visit: Payer: Self-pay | Admitting: *Deleted

## 2014-03-10 VITALS — BP 145/94 | HR 82 | Temp 98.4°F | Resp 20 | Ht 61.0 in | Wt 245.5 lb

## 2014-03-10 DIAGNOSIS — C50919 Malignant neoplasm of unspecified site of unspecified female breast: Secondary | ICD-10-CM

## 2014-03-10 DIAGNOSIS — C50412 Malignant neoplasm of upper-outer quadrant of left female breast: Secondary | ICD-10-CM

## 2014-03-10 DIAGNOSIS — Z171 Estrogen receptor negative status [ER-]: Secondary | ICD-10-CM | POA: Diagnosis not present

## 2014-03-10 DIAGNOSIS — C773 Secondary and unspecified malignant neoplasm of axilla and upper limb lymph nodes: Secondary | ICD-10-CM | POA: Diagnosis not present

## 2014-03-10 DIAGNOSIS — Z85038 Personal history of other malignant neoplasm of large intestine: Secondary | ICD-10-CM

## 2014-03-10 LAB — CBC WITH DIFFERENTIAL/PLATELET
BASO%: 0.8 % (ref 0.0–2.0)
Basophils Absolute: 0 10*3/uL (ref 0.0–0.1)
EOS%: 2.8 % (ref 0.0–7.0)
Eosinophils Absolute: 0.2 10*3/uL (ref 0.0–0.5)
HCT: 37.9 % (ref 34.8–46.6)
HGB: 12.4 g/dL (ref 11.6–15.9)
LYMPH%: 32.5 % (ref 14.0–49.7)
MCH: 29 pg (ref 25.1–34.0)
MCHC: 32.6 g/dL (ref 31.5–36.0)
MCV: 88.8 fL (ref 79.5–101.0)
MONO#: 0.5 10*3/uL (ref 0.1–0.9)
MONO%: 8.4 % (ref 0.0–14.0)
NEUT#: 3.1 10*3/uL (ref 1.5–6.5)
NEUT%: 55.5 % (ref 38.4–76.8)
Platelets: 300 10*3/uL (ref 145–400)
RBC: 4.27 10*6/uL (ref 3.70–5.45)
RDW: 13.6 % (ref 11.2–14.5)
WBC: 5.6 10*3/uL (ref 3.9–10.3)
lymph#: 1.8 10*3/uL (ref 0.9–3.3)

## 2014-03-10 LAB — COMPREHENSIVE METABOLIC PANEL (CC13)
ALBUMIN: 3.4 g/dL — AB (ref 3.5–5.0)
ALT: 14 U/L (ref 0–55)
AST: 12 U/L (ref 5–34)
Alkaline Phosphatase: 120 U/L (ref 40–150)
Anion Gap: 12 mEq/L — ABNORMAL HIGH (ref 3–11)
BUN: 7.6 mg/dL (ref 7.0–26.0)
CO2: 23 mEq/L (ref 22–29)
Calcium: 9.6 mg/dL (ref 8.4–10.4)
Chloride: 108 mEq/L (ref 98–109)
Creatinine: 0.8 mg/dL (ref 0.6–1.1)
Glucose: 101 mg/dl (ref 70–140)
POTASSIUM: 3.6 meq/L (ref 3.5–5.1)
Sodium: 142 mEq/L (ref 136–145)
Total Bilirubin: 1.3 mg/dL — ABNORMAL HIGH (ref 0.20–1.20)
Total Protein: 7.2 g/dL (ref 6.4–8.3)

## 2014-03-10 MED ORDER — PROCHLORPERAZINE MALEATE 10 MG PO TABS
10.0000 mg | ORAL_TABLET | Freq: Four times a day (QID) | ORAL | Status: DC | PRN
Start: 2014-03-10 — End: 2014-05-02

## 2014-03-10 MED ORDER — ONDANSETRON HCL 8 MG PO TABS: 8.0000 mg | ORAL_TABLET | Freq: Two times a day (BID) | ORAL | Status: DC | PRN

## 2014-03-10 MED ORDER — DEXAMETHASONE 4 MG PO TABS: ORAL_TABLET | ORAL | Status: DC

## 2014-03-10 MED ORDER — LORAZEPAM 0.5 MG PO TABS: 0.5000 mg | ORAL_TABLET | Freq: Four times a day (QID) | ORAL | Status: DC | PRN

## 2014-03-10 MED ORDER — LIDOCAINE-PRILOCAINE 2.5-2.5 % EX CREA: 1.0000 "application " | TOPICAL_CREAM | Freq: Once | CUTANEOUS | Status: DC

## 2014-03-10 NOTE — Patient Instructions (Addendum)
Prescriptions will go to your pharmacy (+printed prescription for ativan/lorazepan)  Decadron (dexamethasone) 4mg : steroid medicine used to prevent nausea. Take each dose with food. Take 2 tablets in AM day after chemo, then 2 tablets twice daily for next 2 days.   Zofran (ondansetron) 8mg : take one tablet every 8 hrs as needed for nausea. This will NOT make you sleepy. Stop this one and let us know if you get a headache after taking it.  Compazine (prochlorperazine) 10 mg:  Take one tablet every 6 hours as needed for nausea. This might make you a little drowsy.   Ativan (lorazepam) 0.5 mg: one tablet dissolve under tongue or swallow every 6 hrs as needed for nausea. This will make you drowsy and a little forgetful around each dose. It is a good one to use at bedtime if nausea med needed then  [facility] numbing cream for portacath: apply to the skin over portacath ~ 30-60 min before it will be used, cover with plastic wrap from kitchen  Senokot S or miralax or stool softener colace (or generics for any of these) fine if you are a little constipated after chemo.  Claritin (lortadine) 10 mg daily can help if aches after neulasta shot for white blood cells   Call if any questions or concerns   [phone]

## 2014-03-10 NOTE — Progress Notes (Signed)
[place]  [phone] [phone]     ID: [name] DOB: 63-14-1952  MR#: [id number]  WCB#:[id number]  Patient Care Team: Glendale Chard, MD as PCP - General (Internal Medicine) [facility] Morita  CHIEF COMPLAINT: Patient seen, together with daughter, at request of Dr Jana Hakim, to review completed workup for recently diagnosed left breast cancer and confirm plans for neoadjuvant chemotherapy, which is to begin on 03-13-14.  CURRENT TREATMENT: Dose dense AC with neulasta support, to begin 03-13-14   BREAST CANCER HISTORY: Henna palpated a mass in her left breast, and immediately brought it to the attention of her primary care physician, Dr. Baird Cancer, who confirmed the finding and setup the patient for bilateral diagnostic mammography at the [facility] Center 02/20/2014. This showed a high density mass in the outer left breast measuring up to 3.7 cm.  The mass was palpable by exam, and by ultrasonography measured 2.6 cm. There were enlarged and morphologically abnormal lymph nodes in the left axilla, largest measuring 3 cm.  Biopsy of the left breast mass and 1 of the abnormal axillary lymph nodes 02/22/2014 showed ([id number]) biopsies to be positive for an invasive ductal carcinoma, grade 3, triple negative, with an MIB-1 of 90%. Specifically the HER-2 signals ratio was 1.05, and the number per cell 2.00.  MRI of the breasts 03/01/2014 showed a 3.8 cm enhancing mass with areas of apparent necrosis in the left breast, as well as the biopsy tract extending laterally, the combined measure 5.3 cm. There were no other masses or areas of abnormal enhancement. There were multiple abnormally enlarged left axillary lymph nodes with loss of the normal fatty hilum. These included level I and retropectoral lymph nodes.   INTERVAL HISTORY: Patient had CT chest + PET done 03-06-14, results below and discussed today as noted. Echocardiogram 03-09-14 had EF  55-60% with normal systolic function and normal wall motion, see below, discussed now. Portacath was placed by IR on 03-08-14 without difficulty. Patient and daughter attended chemotherapy teaching class on 03-09-14.  Patient has tolerated all of the work up and procedures very well,  has been going about all regular activities and is not having difficulty with anxiety.   REVIEW OF SYSTEMS: No fever or symptoms of infection. Soreness at left breast since needle biopsy, no worse. No swelling LUE. Minimal discomfort from PAC. No SOB or other respiratory symptoms. Bowels generally move several times daily and are a little loose, x years and thought related to peach tea. Good appetite. No abdominal or pelvic pain. No LE swelling.   PAST MEDICAL HISTORY: Past Medical History  Diagnosis Date  . Colon cancer   . Hx of rotator cuff surgery     PAST SURGICAL HISTORY: Past Surgical History  Procedure Laterality Date  . Abdominal hysterectomy    . Colon surgery      colectomy/colostomy with takedown    FAMILY HISTORY Family History  Problem Relation Age of Onset  . Breast cancer Mother     GYNECOLOGIC HISTORY:  No LMP recorded. Patient has had a hysterectomy.  SOCIAL HISTORY:      ADVANCED DIRECTIVES: information given at her first meeting with Dr Jana Hakim   HEALTH MAINTENANCE: History  Substance Use Topics  . Smoking status: Never Smoker   . Smokeless tobacco: Not on file  . Alcohol Use: No   Friends smoke and I have encouraged her not to allow smoking in house.    No Known Allergies   OBJECTIVE: Filed  Vitals:   03/10/14 0818  BP: 145/94  Pulse: 82  Temp: 98.4 F (36.9 C)  Resp: 20     Body mass index is 46.41 kg/(m^2).    ECOG FS: 0 Alert, oriented and appropriate, very pleasant; daughter very supportive. Ocular: Sclerae unicteric, pupils equal, round and reactive to light.  Ear-nose-throat: Oropharynx clear, no apparent dental problems.Mucous membranes moist and  clear. Neck supple without JVD or tenderness on right. Normal hair pattern. Lymphatic: No cervical or supraclavicular adenopathy. Left axillary nodal mass ~ 3.5 cm diameter, not fixed, not tender. Lungs no rales or rhonchi, good excursion bilaterally, clear to percussion Heart regular rate and rhythm, no murmur appreciated Abd soft, nontender, positive bowel sounds. Midline and LLQ surgical scars healed. No appreciable HSM or mass. MSK no focal spinal tenderness, no joint swelling, no LE edema/cords/tenderness Neuro: non-focal, well-oriented, appropriate affect Breasts: Left with ~ 3 cm mass upper outer beneath biopsy site, no overlying skin changes, not fixed, minimally tender, no ecchymosis, no masses appreciated otherwise in left breast. Right breast without mass or skin/ nipple findings of concern. PAC with surgical glue, minimal bruising, no unexpected tenderness, good position.  LAB RESULTS:  CMP     Component Value Date/Time   NA 142 03/10/2014 0807   NA 142 04/13/2010 0403   K 3.6 03/10/2014 0807   K 3.4* 04/13/2010 0403   CL 107 04/13/2010 0403   CO2 23 03/10/2014 0807   CO2 29 04/13/2010 0403   GLUCOSE 101 03/10/2014 0807   GLUCOSE 110* 04/13/2010 0403   BUN 7.6 03/10/2014 0807   BUN 5* 04/13/2010 0403   CREATININE 0.8 03/10/2014 0807   CREATININE 0.81 04/13/2010 0403   CALCIUM 9.6 03/10/2014 0807   CALCIUM 8.4 04/13/2010 0403   PROT 7.2 03/10/2014 0807   PROT 6.9 04/05/2008 1120   ALBUMIN 3.4* 03/10/2014 0807   ALBUMIN 3.6 04/05/2008 1120   AST 12 03/10/2014 0807   AST 16 04/05/2008 1120   ALT 14 03/10/2014 0807   ALT 14 04/05/2008 1120   ALKPHOS 120 03/10/2014 0807   ALKPHOS 103 04/05/2008 1120   BILITOT 1.30* 03/10/2014 0807   BILITOT 1.2 04/05/2008 1120   GFRNONAA >60 04/13/2010 0403   GFRAA  Value: >60        The eGFR has been calculated using the MDRD equation. This calculation has not been validated in all clinical situations. eGFR's persistently <60 mL/min signify possible Chronic  Kidney Disease. 04/13/2010 0403      Lab Results  Component Value Date   WBC 5.6 03/10/2014   NEUTROABS 3.1 03/10/2014   HGB 12.4 03/10/2014   HCT 37.9 03/10/2014   MCV 88.8 03/10/2014   PLT 300 03/10/2014      Chemistry      Component Value Date/Time   NA 142 03/10/2014 0807   NA 142 04/13/2010 0403   K 3.6 03/10/2014 0807   K 3.4* 04/13/2010 0403   CL 107 04/13/2010 0403   CO2 23 03/10/2014 0807   CO2 29 04/13/2010 0403   BUN 7.6 03/10/2014 0807   BUN 5* 04/13/2010 0403   CREATININE 0.8 03/10/2014 0807   CREATININE 0.81 04/13/2010 0403      Component Value Date/Time   CALCIUM 9.6 03/10/2014 0807   CALCIUM 8.4 04/13/2010 0403   ALKPHOS 120 03/10/2014 0807   ALKPHOS 103 04/05/2008 1120   AST 12 03/10/2014 0807   AST 16 04/05/2008 1120   ALT 14 03/10/2014 0807   ALT 14 04/05/2008 1120  BILITOT 1.30* 03/10/2014 0807   BILITOT 1.2 04/05/2008 1120        STUDIES: Ct Chest W Contrast  03/06/2014   CLINICAL DATA:  New diagnosis of left-sided breast cancer.  EXAM: CT CHEST WITH CONTRAST  TECHNIQUE: Multidetector CT imaging of the chest was performed during intravenous contrast administration.  CONTRAST:  89m OMNIPAQUE IOHEXOL 300 MG/ML  SOLN  COMPARISON:  Today's PET, dictated separately. Breast MRI of 03/01/2014. Chest CT 04/12/2010  FINDINGS: Lungs/Pleura: 4 mm right upper lobe lung nodule on image 16 is similar to image 28 of the prior chest CT, consistent with a benign etiology.  Right lower lobe 4 mm nodule on image 47/ series 5. Not readily apparent on the prior chest CT.  Favor dependent scarring in the right lower lobe more superiorly on image 31.  No pleural fluid.  Heart/Mediastinum: No supraclavicular adenopathy. No right axillary adenopathy. Left axillary adenopathy. Index node measures 2.4 x 2.3 cm on image 11.  Left paracentral deep lateral breast mass measures 3.2 x 2.9 cm on image 14.  Bilateral low-density thyroid nodules. The dominant 2.9 cm lesion on image 7 corresponds to mild  hypermetabolism on PET. Grossly similar to on the 04/12/2010 exam.  Mild cardiomegaly, without pericardial effusion. No central pulmonary embolism, on this non-dedicated study. No mediastinal or hilar adenopathy. A small hiatal hernia. Fluid level in the thoracic esophagus on image 18.  Upper Abdomen: Normal imaged portions of the liver, spleen, distal stomach, pancreas, adrenal glands, kidneys, gallbladder, biliary tract. DElliottwithin the splenic flexure the colon.  Bones/Musculoskeletal:  Right shoulder arthroplasty.  IMPRESSION: 1. Left breast primary with left axillary nodal metastasis. 2. Right-sided lung nodules. A lower lobe nodule is not readily apparent on the prior exam and warrants followup attention. 3. Bilateral thyroid nodules with a dominant right-sided lesion which is grossly similar in size back to 2011, but corresponds with mild hypermetabolism at PET. Consider ultrasound. 4. Small hiatal hernia. Esophageal air fluid level suggests dysmotility or gastroesophageal reflux.   Electronically Signed   By: KAbigail MiyamotoM.D.   On: 03/06/2014 12:48   Mr Breast Bilateral W Wo Contrast  03/01/2014   CLINICAL DATA:  Recently diagnosed left breast invasive ductal carcinoma and metastatic left axillary lymph node.  LABS:  BUN and creatinine [facility]  [address]  Results:  BUN 9 mg/dL,  Creatinine 1.0 mg/dL.  EXAM: BILATERAL BREAST MRI WITH AND WITHOUT CONTRAST  TECHNIQUE: Multiplanar, multisequence MR images of both breasts were obtained prior to and following the intravenous administration of 240mof MultiHance.  THREE-DIMENSIONAL MR IMAGE RENDERING ON INDEPENDENT WORKSTATION:  Three-dimensional MR images were rendered by post-processing of the original MR data on an independent workstation. The three-dimensional MR images were interpreted, and findings are reported in the following complete MRI report for this study. Three dimensional images were  evaluated at the independent DynaCad workstation  COMPARISON:  Recent mammogram, ultrasound and biopsy examinations.  FINDINGS: Breast composition: b.  Scattered fibroglandular tissue.  Background parenchymal enhancement: Minimal  Right breast: No mass or abnormal enhancement.  Left breast: 3.8 x 3.6 x 2.9 cm rounded enhancing mass with areas of nonenhancement compatible with necrosis in the 12 o'clock position of the left breast, containing a biopsy marker clip artifact. There is also enhancement surrounding the biopsy tract extending lateral to the mass. The tract and mass combined measure 5.3 cm in maximum diameter. The mass and enhancement surrounding the biopsy tract  have persistent and plateau enhancement kinetics. No additional masses or areas of abnormal enhancement suspicious for malignancy in the left breast.  Lymph nodes: Multiple abnormally enlarged, rounded left axillary lymph nodes with loss of the normal fatty hila. These include level 1 and retropectoral lymph nodes.  Ancillary findings:  None.  IMPRESSION: 1. 3.8 x 3.6 x 2.9 cm biopsy-proven invasive ductal carcinoma in the 12 o'clock position of the left breast with adjacent post biopsy changes extending laterally, with a combined maximum diameter of 5.3 cm. 2. Multiple metastatic Level 1 and Level 2 left axillary lymph nodes. 3. No evidence of malignancy on the right.  RECOMMENDATION: Treatment plan.  BI-RADS CATEGORY  6: Known biopsy-proven malignancy.   Electronically Signed   By: Enrique Sack M.D.   On: 03/01/2014 09:56   Nm Pet Image Initial (pi) Skull Base To Thigh  03/06/2014   CLINICAL DATA:  Initial treatment strategy for left-sided breast cancer. Positive lymph nodes.  EXAM: NUCLEAR MEDICINE PET SKULL BASE TO THIGH  TECHNIQUE: 15.2 mCi F-18 FDG was injected intravenously. Full-ring PET imaging was performed from the skull base to thigh after the radiotracer. CT data was obtained and used for attenuation correction and anatomic  localization.  FASTING BLOOD GLUCOSE:  Value: 86 mg/dl  COMPARISON:  Breast MR a of 03/01/2014.  FINDINGS: NECK  Mild hypermetabolism corresponding to enlargement and heterogeneity of the right lobe of the thyroid. This measures a S.U.V. max of 5.3 on image 30/series 4. Ill-defined hypoattenuation in this area measures 3.1 x 3.1 cm.  CHEST  Deep left paracentral breast mass which measures 3.1 cm and a S.U.V. max of 15.8 on image 43/series 4.  Left axillary nodal metastasis. An index node measures 2.5 x 2.4 cm and a S.U.V. max of 12.4 on image 42.  ABDOMEN/PELVIS  Hypermetabolism corresponding with underdistention involving the sigmoid colon and rectum. This measures a S.U.V. max of 15.3, including on image 148. There are surgical changes within the surrounding mesocolon, with suggestion of prominent perirectal vessels.  SKELETON  Likely degenerative hypermetabolism about the sternal manubrial joint.  CT IMAGES PERFORMED FOR ATTENUATION CORRECTION  Mild degradation secondary to patient body habitus. No significant findings within the neck. Chest findings deferred to today's chest CT, not yet performed. Small hiatal hernia. Mild cardiomegaly.  Left paracentral pelvic anterior wall hernia containing nonobstructive small bowel. Hysterectomy. 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 Left glenohumeral joint osteoarthritis with prior right glenohumeral joint arthroplasty.  IMPRESSION: 1. Left breast primary with left axillary nodal metastasis. 2. No evidence of extrathoracic hypermetabolic metastatic disease. 3. Please see chest CT, scheduled for later today. 4. Right thyroid enlargement and heterogeneity with dominant right-sided mass. Consider thyroid ultrasound for further evaluation. 5. Hypermetabolism within the sigmoid and rectum which could be physiologic or due to colitis. Questionable surrounding hyperemia and surgical changes in this area. 6. Incidental findings,  including small bowel containing left pelvic wall hernia.   Electronically Signed   By: Abigail Miyamoto M.D.   On: 03/06/2014 11:56   Ir Fluoro Guide Cv Line Right  03/08/2014   CLINICAL DATA:  Breast cancer  EXAM: TUNNEL POWER PORT PLACEMENT WITH SUBCUTANEOUS POCKET UTILIZING ULTRASOUND & FLOUROSCOPY  FLUOROSCOPY TIME:  36 seconds.  MEDICATIONS AND MEDICAL HISTORY: Versed two mg, Fentanyl 50 mcg.  As antibiotic prophylaxis, Ancef was ordered pre-procedure and administered intravenously within one hour of incision.  ANESTHESIA/SEDATION: Moderate sedation time: 30 minutes  CONTRAST:  None  PROCEDURE: After written  informed consent was obtained, patient was placed in the supine position on angiographic table. The right neck and chest was prepped and draped in a sterile fashion. Lidocaine was utilized for local anesthesia. The right internal jugular vein was noted to be patent initially with ultrasound. Under sonographic guidance, a micropuncture needle was inserted into the right IJ vein (Ultrasound and fluoroscopic image documentation was performed). The needle was removed over an 018 wire which was exchanged for a Amplatz. This was advanced into the IVC. An 8-French dilator was advanced over the Amplatz.  A small incision was made in the right upper chest over the anterior right second rib. Utilizing blunt dissection, a subcutaneous pocket was created in the caudal direction. The pocket was irrigated with a copious amount of sterile normal saline. The port catheter was tunneled from the chest incision, and out the neck incision. The reservoir was inserted into the subcutaneous pocket and secured with two 3-0 Ethilon stitches. A peel-away sheath was advanced over the Amplatz wire. The port catheter was cut to measure length and inserted through the peel-away sheath. The peel-away sheath was removed. The chest incision was closed with 3-0 Vicryl interrupted stitches for the subcutaneous tissue and a running of 4-0  Vicryl subcuticular stitch for the skin. The neck incision was closed with a 4-0 Vicryl subcuticular stitch. Derma-bond was applied to both surgical incisions. The port reservoir was flushed and instilled with heparinized saline. No complications.  FINDINGS: A right IJ vein Port-A-Cath is in place with its tip at the cavoatrial junction.  IMPRESSION: Successful 8 French right internal jugular vein power port placement with its tip at the SVC/RA junction.   Electronically Signed   By: Maryclare Bean M.D.   On: 03/08/2014 13:58   Ir US Guide Vasc Access Right  03/08/2014   CLINICAL DATA:  Breast cancer  EXAM: TUNNEL POWER PORT PLACEMENT WITH SUBCUTANEOUS POCKET UTILIZING ULTRASOUND & FLOUROSCOPY  FLUOROSCOPY TIME:  36 seconds.  MEDICATIONS AND MEDICAL HISTORY: Versed two mg, Fentanyl 50 mcg.  As antibiotic prophylaxis, Ancef was ordered pre-procedure and administered intravenously within one hour of incision.  ANESTHESIA/SEDATION: Moderate sedation time: 30 minutes  CONTRAST:  None  PROCEDURE: After written informed consent was obtained, patient was placed in the supine position on angiographic table. The right neck and chest was prepped and draped in a sterile fashion. Lidocaine was utilized for local anesthesia. The right internal jugular vein was noted to be patent initially with ultrasound. Under sonographic guidance, a micropuncture needle was inserted into the right IJ vein (Ultrasound and fluoroscopic image documentation was performed). The needle was removed over an 018 wire which was exchanged for a Amplatz. This was advanced into the IVC. An 8-French dilator was advanced over the Amplatz.  A small incision was made in the right upper chest over the anterior right second rib. Utilizing blunt dissection, a subcutaneous pocket was created in the caudal direction. The pocket was irrigated with a copious amount of sterile normal saline. The port catheter was tunneled from the chest incision, and out the neck  incision. The reservoir was inserted into the subcutaneous pocket and secured with two 3-0 Ethilon stitches. A peel-away sheath was advanced over the Amplatz wire. The port catheter was cut to measure length and inserted through the peel-away sheath. The peel-away sheath was removed. The chest incision was closed with 3-0 Vicryl interrupted stitches for the subcutaneous tissue and a running of 4-0 Vicryl subcuticular stitch for the skin. The neck incision was  closed with a 4-0 Vicryl subcuticular stitch. Derma-bond was applied to both surgical incisions. The port reservoir was flushed and instilled with heparinized saline. No complications.  FINDINGS: A right IJ vein Port-A-Cath is in place with its tip at the cavoatrial junction.  IMPRESSION: Successful 8 French right internal jugular vein power port placement with its tip at the SVC/RA junction.   Electronically Signed   By: Maryclare Bean M.D.   On: 03/08/2014 13:58   Mm Digital Diagnostic Bilat  02/20/2014   CLINICAL DATA:  63 year old female with a left breast palpable abnormality. The patient had a previously seen probably benign asymmetry in the left breast which she failed to follow-up.  EXAM: DIGITAL DIAGNOSTIC  LEFT MAMMOGRAM WITH CAD  ULTRASOUND LEFT BREAST  COMPARISON:  Previous exams.  ACR Breast Density Category b: There are scattered areas of fibroglandular density.  FINDINGS: There is a high density mass in the outer left breast with associated distortion measuring up to 3.7 cm and confirmed on the additional spot compression tangential view. This corresponds to the site of palpable concern in the left breast. In addition, there are morphologically abnormal lymph nodes in the left axilla.  Mammographic images were processed with CAD.  Physical examination at site of palpable concern in the left breast reveals a firm mass at the approximate 12 to 1 o'clock position.  Targeted ultrasound of the left breast was performed demonstrating an irregular  hypoechoic mass at 1 o'clock 2 cm from the nipple measuring 2.6 x 2.5 x 2.6 cm. This corresponds with mammography findings.  Enlarged and morphologically abnormal lymph nodes are seen in the left axilla, the largest of which measures 3 x 2.1 x 2 cm.  IMPRESSION: Highly suspicious left breast mass and left axillary lymphadenopathy.  RECOMMENDATION: Ultrasound-guided biopsy of the mass in the left breast as well as the enlarged left axillary lymph node is recommended. This is scheduled for Wednesday June 10th at 11:30 a.m.  I have discussed the findings and recommendations with the patient. Results were also provided in writing at the conclusion of the visit. If applicable, a reminder letter will be sent to the patient regarding the next appointment.  BI-RADS CATEGORY  5: Highly suggestive of malignancy.   Electronically Signed   By: Everlean Alstrom M.D.   On: 02/20/2014 13:42   Mm Digital Diagnostic Unilat L  02/22/2014   CLINICAL DATA:  Post biopsy of a highly suspicious left breast mass and left axillary lymph node.  EXAM: DIAGNOSTIC LEFT MAMMOGRAM POST ULTRASOUND BIOPSY  COMPARISON:  Previous exams  FINDINGS: Mammographic images were obtained following ultrasound guided biopsy of a highly suspicious mass in the upper left breast as well as of a highly suspicious left axillary lymph node. A ribbon shaped biopsy marking clip is present in the targeted region of the mass.  IMPRESSION: Appropriate positioning of ribbon shaped biopsy marking clip in the left breast post biopsy of a highly suspicious mass at 12 o'clock.  Final Assessment: Post Procedure Mammograms for Marker Placement   Electronically Signed   By: Everlean Alstrom M.D.   On: 02/22/2014 12:33   US Breast Ltd Uni Left Inc Axilla  02/20/2014   CLINICAL DATA:  63 year old female with a left breast palpable abnormality. The patient had a previously seen probably benign asymmetry in the left breast which she failed to follow-up.  EXAM: DIGITAL  DIAGNOSTIC  LEFT MAMMOGRAM WITH CAD  ULTRASOUND LEFT BREAST  COMPARISON:  Previous exams.  ACR Breast Density Category  b: There are scattered areas of fibroglandular density.  FINDINGS: There is a high density mass in the outer left breast with associated distortion measuring up to 3.7 cm and confirmed on the additional spot compression tangential view. This corresponds to the site of palpable concern in the left breast. In addition, there are morphologically abnormal lymph nodes in the left axilla.  Mammographic images were processed with CAD.  Physical examination at site of palpable concern in the left breast reveals a firm mass at the approximate 12 to 1 o'clock position.  Targeted ultrasound of the left breast was performed demonstrating an irregular hypoechoic mass at 1 o'clock 2 cm from the nipple measuring 2.6 x 2.5 x 2.6 cm. This corresponds with mammography findings.  Enlarged and morphologically abnormal lymph nodes are seen in the left axilla, the largest of which measures 3 x 2.1 x 2 cm.  IMPRESSION: Highly suspicious left breast mass and left axillary lymphadenopathy.  RECOMMENDATION: Ultrasound-guided biopsy of the mass in the left breast as well as the enlarged left axillary lymph node is recommended. This is scheduled for Wednesday June 10th at 11:30 a.m.  I have discussed the findings and recommendations with the patient. Results were also provided in writing at the conclusion of the visit. If applicable, a reminder letter will be sent to the patient regarding the next appointment.  BI-RADS CATEGORY  5: Highly suggestive of malignancy.   Electronically Signed   By: Everlean Alstrom M.D.   On: 02/20/2014 13:42   [place] Lt Breast Bx W Loc Dev 1st Lesion Img Bx Spec US Guide  02/23/2014   ADDENDUM REPORT: 02/23/2014 16:25  ADDENDUM: THE PATHOLOGY ASSOCIATED WITH THE LEFT BREAST ULTRASOUND-GUIDED CORE BIOPSY DEMONSTRATED GRADE 3 INVASIVE DUCTAL CARCINOMA. THE PATHOLOGY ASSOCIATED WITH THE LEFT AXILLARY  LYMPH NODE ULTRASOUND-GUIDED CORE BIOPSY DEMONSTRATED METASTATIC CARCINOMA WITHIN THE LYMPH NODE. THE PATHOLOGY IS CONCORDANT WITH THE IMAGING FINDINGS. I HAVE DISCUSSED FINDINGS WITH THE PATIENT BY TELEPHONE AND ANSWERED HER QUESTIONS. PATIENT STATES HER BIOPSY SITES ARE CLEAN AND DRY WITHOUT HEMATOMA FORMATION OR SIGNS OF INFECTION. THERE IS MILD TENDERNESS. THE PATIENT IS SCHEDULED FOR THE BREAST CANCER [place] ON 06/17 2015. THE PATIENT IS ALSO SCHEDULED FOR BREAST MRI ON 03/01/2014 AT 7:15 A.M. POST BIOPSY WOUND CARE INSTRUCTIONS WERE REVIEWED WITH THE PATIENT. THE PATIENT WAS ENCOURAGED TO CALL OUR BREAST CENTER FOR ADDITIONAL QUESTIONS OR CONCERNS.   Electronically Signed   By: Luberta Robertson M.D.   On: 02/23/2014 16:25   02/23/2014   CLINICAL DATA:  Highly suspicious left breast mass and left axillary lymph node.  EXAM: ULTRASOUND GUIDED LEFT BREAST CORE NEEDLE BIOPSY  COMPARISON:  Previous exams.  PROCEDURE: I met with the patient and we discussed the procedure of ultrasound-guided biopsy, including benefits and alternatives. We discussed the high likelihood of a successful procedure. We discussed the risks of the procedure including infection, bleeding, tissue injury, clip migration, and inadequate sampling. Informed written consent was given. The usual time-out protocol was performed immediately prior to the procedure.  1. Using sterile technique and 2% Lidocaine as local anesthetic, under direct ultrasound visualization, a 12 gauge vacuum-assisteddevice was used to perform biopsy of highly suspicious mass in the left breast at 12 o'clock using a lateral approach. At the conclusion of the procedure, a ribbon shaped tissue marker clip was deployed into the biopsy cavity. Follow-up 2-view mammogram was performed and dictated separately.  2. Using sterile technique and 2% Lidocaine as local anesthetic, under direct ultrasound visualization,  a 12 gauge vacuum-assisteddevice was used to  perform biopsy of the enlarged and morphologically abnormal lymph node in the low left axilla using a lateral approach.  IMPRESSION: Ultrasound-guided biopsy of a highly suspicious mass in the left breast as well as of a highly suspicious left axillary lymph node. No apparent complications.  Electronically Signed: By: Everlean Alstrom M.D. On: 02/22/2014 12:09   [place] Lt Breast Bx W Loc Dev Ea Add Lesion Img Bx Spec US Guide  02/23/2014   ADDENDUM REPORT: 02/23/2014 16:25  ADDENDUM: THE PATHOLOGY ASSOCIATED WITH THE LEFT BREAST ULTRASOUND-GUIDED CORE BIOPSY DEMONSTRATED GRADE 3 INVASIVE DUCTAL CARCINOMA. THE PATHOLOGY ASSOCIATED WITH THE LEFT AXILLARY LYMPH NODE ULTRASOUND-GUIDED CORE BIOPSY DEMONSTRATED METASTATIC CARCINOMA WITHIN THE LYMPH NODE. THE PATHOLOGY IS CONCORDANT WITH THE IMAGING FINDINGS. I HAVE DISCUSSED FINDINGS WITH THE PATIENT BY TELEPHONE AND ANSWERED HER QUESTIONS. PATIENT STATES HER BIOPSY SITES ARE CLEAN AND DRY WITHOUT HEMATOMA FORMATION OR SIGNS OF INFECTION. THERE IS MILD TENDERNESS. THE PATIENT IS SCHEDULED FOR THE BREAST CANCER [place] ON 06/17 2015. THE PATIENT IS ALSO SCHEDULED FOR BREAST MRI ON 03/01/2014 AT 7:15 A.M. POST BIOPSY WOUND CARE INSTRUCTIONS WERE REVIEWED WITH THE PATIENT. THE PATIENT WAS ENCOURAGED TO CALL OUR BREAST CENTER FOR ADDITIONAL QUESTIONS OR CONCERNS.   Electronically Signed   By: Luberta Robertson M.D.   On: 02/23/2014 16:25   02/23/2014   CLINICAL DATA:  Highly suspicious left breast mass and left axillary lymph node.  EXAM: ULTRASOUND GUIDED LEFT BREAST CORE NEEDLE BIOPSY  COMPARISON:  Previous exams.  PROCEDURE: I met with the patient and we discussed the procedure of ultrasound-guided biopsy, including benefits and alternatives. We discussed the high likelihood of a successful procedure. We discussed the risks of the procedure including infection, bleeding, tissue injury, clip migration, and inadequate sampling. Informed written consent was  given. The usual time-out protocol was performed immediately prior to the procedure.  1. Using sterile technique and 2% Lidocaine as local anesthetic, under direct ultrasound visualization, a 12 gauge vacuum-assisteddevice was used to perform biopsy of highly suspicious mass in the left breast at 12 o'clock using a lateral approach. At the conclusion of the procedure, a ribbon shaped tissue marker clip was deployed into the biopsy cavity. Follow-up 2-view mammogram was performed and dictated separately.  2. Using sterile technique and 2% Lidocaine as local anesthetic, under direct ultrasound visualization, a 12 gauge vacuum-assisteddevice was used to perform biopsy of the enlarged and morphologically abnormal lymph node in the low left axilla using a lateral approach.  IMPRESSION: Ultrasound-guided biopsy of a highly suspicious mass in the left breast as well as of a highly suspicious left axillary lymph node. No apparent complications.  Electronically Signed: By: Everlean Alstrom M.D. On: 02/22/2014 12:09    ECHOCARDIOGRAM 03-09-14  LV EF: 55% - 60% Study Conclusions  - Left ventricle: The global LV strain is -18.2% The cavity size was normal. Systolic function was normal. The estimated ejection fraction was in the range of 55% to 60%. Wall motion was normal; there were no regional wall motion abnormalities. There was an increased relative contribution of atrial contraction to ventricular filling. Doppler parameters are consistent with abnormal left ventricular relaxation (grade 1 diastolic dysfunction). - Mitral valve: There was trivial regurgitation.     DISCUSSION: I have told patient and daughter that the CT chest/ PET show 4 mm lung nodules RUL and RLL, the upper lobe area seen on CT 2011 and the lower area not clearly seen then;  these are too small for PET evaluation, may well both be old and benign, and can be followed up on subsequent imaging. She has bilateral thyroid nodules with  similar size nodule on right c/w 2011 but some PET uptake right lobe, which likely warrants thyroid US at least after present breast cancer interventions have finished. Otherwise there does not appear to be distant metastatic disease beyond the known left breast/ left axillary involvement.  We have discussed antiemetics, with usual "pretreatment day 1" medications sent to her pharmacy. We have discussed neulasta injection day 2, including mechanism of action, rationale, and possible associated aches (suggested trying claritin if aches). They understand treatment schedule and know that they can contact this office if questions or concerns prior to scheduled visits.    ASSESSMENT: 14. 63 y.o. 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 2.PAC in 3.post hysterectomy BSO 4.post partial colectomy for early stage colon ca 1980s 5.genetics testing pending 6. Multinodular goiter: dominant right nodule with some uptake on PET, [place] evaluation suggested at some point 7.12m RUL and RLL lung nodules uncertain significance: follow with next imagi  The patient has a good understanding of the overall plan. She agrees with it. She knows the goal of treatment in her case is cure. She will call with any problems that may develop before her next visit here.  Cycle 1 AC and neulasta orders confirmed. Consent noted. Prior auth notified.  LIVESAY,LENNIS P, MD   03/10/2014 9:16 AM

## 2014-03-13 ENCOUNTER — Telehealth: Payer: Self-pay | Admitting: *Deleted

## 2014-03-13 ENCOUNTER — Ambulatory Visit: Payer: Medicare Other

## 2014-03-13 ENCOUNTER — Other Ambulatory Visit: Payer: Medicare Other

## 2014-03-13 ENCOUNTER — Ambulatory Visit (HOSPITAL_BASED_OUTPATIENT_CLINIC_OR_DEPARTMENT_OTHER): Payer: Medicare Other

## 2014-03-13 VITALS — BP 147/94 | HR 93 | Temp 98.1°F | Resp 19

## 2014-03-13 DIAGNOSIS — Z5111 Encounter for antineoplastic chemotherapy: Secondary | ICD-10-CM | POA: Diagnosis not present

## 2014-03-13 DIAGNOSIS — C50919 Malignant neoplasm of unspecified site of unspecified female breast: Secondary | ICD-10-CM

## 2014-03-13 DIAGNOSIS — C50412 Malignant neoplasm of upper-outer quadrant of left female breast: Secondary | ICD-10-CM

## 2014-03-13 DIAGNOSIS — C773 Secondary and unspecified malignant neoplasm of axilla and upper limb lymph nodes: Secondary | ICD-10-CM | POA: Diagnosis not present

## 2014-03-13 DIAGNOSIS — Z803 Family history of malignant neoplasm of breast: Secondary | ICD-10-CM | POA: Diagnosis not present

## 2014-03-13 DIAGNOSIS — IMO0001 Reserved for inherently not codable concepts without codable children: Secondary | ICD-10-CM | POA: Diagnosis not present

## 2014-03-13 MED ORDER — HEPARIN SOD (PORK) LOCK FLUSH 100 UNIT/ML IV SOLN
500.0000 [IU] | Freq: Once | INTRAVENOUS | Status: AC | PRN
  Administered 2014-03-13: 500 [IU]
  Filled 2014-03-13: qty 5

## 2014-03-13 MED ORDER — DEXAMETHASONE SODIUM PHOSPHATE 20 MG/5ML IJ SOLN
INTRAMUSCULAR | Status: AC
  Filled 2014-03-13: qty 5

## 2014-03-13 MED ORDER — SODIUM CHLORIDE 0.9 % IV SOLN
Freq: Once | INTRAVENOUS | Status: AC
  Administered 2014-03-13: 15:00:00 via INTRAVENOUS

## 2014-03-13 MED ORDER — DEXAMETHASONE SODIUM PHOSPHATE 20 MG/5ML IJ SOLN
12.0000 mg | Freq: Once | INTRAMUSCULAR | Status: AC
  Administered 2014-03-13: 12 mg via INTRAVENOUS

## 2014-03-13 MED ORDER — CYCLOPHOSPHAMIDE CHEMO INJECTION 1 GM
600.0000 mg/m2 | Freq: Once | INTRAMUSCULAR | Status: AC
  Administered 2014-03-13: 1320 mg via INTRAVENOUS
  Filled 2014-03-13: qty 66

## 2014-03-13 MED ORDER — SODIUM CHLORIDE 0.9 % IJ SOLN
10.0000 mL | INTRAMUSCULAR | Status: DC | PRN
  Administered 2014-03-13: 10 mL
  Filled 2014-03-13: qty 10

## 2014-03-13 MED ORDER — DOXORUBICIN HCL CHEMO IV INJECTION 2 MG/ML
60.0000 mg/m2 | Freq: Once | INTRAVENOUS | Status: AC
  Administered 2014-03-13: 132 mg via INTRAVENOUS
  Filled 2014-03-13: qty 66

## 2014-03-13 MED ORDER — FOSAPREPITANT DIMEGLUMINE INJECTION 150 MG
150.0000 mg | Freq: Once | INTRAVENOUS | Status: AC
  Administered 2014-03-13: 150 mg via INTRAVENOUS
  Filled 2014-03-13: qty 5

## 2014-03-13 MED ORDER — PALONOSETRON HCL INJECTION 0.25 MG/5ML
0.2500 mg | Freq: Once | INTRAVENOUS | Status: AC
  Administered 2014-03-13: 0.25 mg via INTRAVENOUS

## 2014-03-13 MED ORDER — PALONOSETRON HCL INJECTION 0.25 MG/5ML
INTRAVENOUS | Status: AC
  Filled 2014-03-13: qty 5

## 2014-03-13 NOTE — Patient Instructions (Signed)
[facility] Discharge Instructions for Patients Receiving Chemotherapy  Today you received the following chemotherapy agents Adriamycin/Cytoxan  To help prevent nausea and vomiting after your treatment, we encourage you to take your nausea medication as needed   If you develop nausea and vomiting that is not controlled by your nausea medication, call the clinic.   BELOW ARE SYMPTOMS THAT SHOULD BE REPORTED IMMEDIATELY:  *FEVER GREATER THAN 100.5 F  *CHILLS WITH OR WITHOUT FEVER  NAUSEA AND VOMITING THAT IS NOT CONTROLLED WITH YOUR NAUSEA MEDICATION  *UNUSUAL SHORTNESS OF BREATH  *UNUSUAL BRUISING OR BLEEDING  TENDERNESS IN MOUTH AND THROAT WITH OR WITHOUT PRESENCE OF ULCERS  *URINARY PROBLEMS  *BOWEL PROBLEMS  UNUSUAL RASH Items with * indicate a potential emergency and should be followed up as soon as possible.  Feel free to call the clinic you have any questions or concerns. The clinic phone number is [phone].

## 2014-03-13 NOTE — Telephone Encounter (Signed)
Patient called and left a voicemail questioning if she should have a Bone Density scan done. Attempted to call patient back, left detailed message that we can further discuss this at next appt. This is not a test that would change her current treatment plan. Left detailed message to call back if she had further questions or concerns.

## 2014-03-14 ENCOUNTER — Telehealth: Payer: Self-pay | Admitting: *Deleted

## 2014-03-14 ENCOUNTER — Ambulatory Visit (HOSPITAL_BASED_OUTPATIENT_CLINIC_OR_DEPARTMENT_OTHER): Payer: Medicare Other

## 2014-03-14 ENCOUNTER — Encounter: Payer: Self-pay | Admitting: *Deleted

## 2014-03-14 VITALS — BP 143/81 | HR 78 | Temp 98.2°F

## 2014-03-14 DIAGNOSIS — C50919 Malignant neoplasm of unspecified site of unspecified female breast: Secondary | ICD-10-CM

## 2014-03-14 DIAGNOSIS — C773 Secondary and unspecified malignant neoplasm of axilla and upper limb lymph nodes: Secondary | ICD-10-CM | POA: Diagnosis not present

## 2014-03-14 DIAGNOSIS — Z5189 Encounter for other specified aftercare: Secondary | ICD-10-CM | POA: Diagnosis not present

## 2014-03-14 DIAGNOSIS — C50412 Malignant neoplasm of upper-outer quadrant of left female breast: Secondary | ICD-10-CM

## 2014-03-14 MED ORDER — PEGFILGRASTIM INJECTION 6 MG/0.6ML
6.0000 mg | Freq: Once | SUBCUTANEOUS | Status: AC
  Administered 2014-03-14: 6 mg via SUBCUTANEOUS
  Filled 2014-03-14: qty 0.6

## 2014-03-14 NOTE — Telephone Encounter (Signed)
[name] here for Neulasta injection following 1st AC chemo treatment.  States that she has been pleasantly surprised that she hasn't had any nausea, vomiting or diarrhea.  Is drinking plenty of fluids and is eating without problems.  All questions answered.  Knows to call if she has any problems or concerns.

## 2014-03-14 NOTE — Patient Instructions (Signed)

## 2014-03-15 ENCOUNTER — Ambulatory Visit: Payer: Medicare Other | Attending: General Surgery | Admitting: Physical Therapy

## 2014-03-15 DIAGNOSIS — Z803 Family history of malignant neoplasm of breast: Secondary | ICD-10-CM | POA: Diagnosis not present

## 2014-03-15 DIAGNOSIS — Z85038 Personal history of other malignant neoplasm of large intestine: Secondary | ICD-10-CM | POA: Insufficient documentation

## 2014-03-15 DIAGNOSIS — C50919 Malignant neoplasm of unspecified site of unspecified female breast: Secondary | ICD-10-CM | POA: Diagnosis not present

## 2014-03-15 DIAGNOSIS — M25519 Pain in unspecified shoulder: Secondary | ICD-10-CM | POA: Diagnosis not present

## 2014-03-15 DIAGNOSIS — M24519 Contracture, unspecified shoulder: Secondary | ICD-10-CM | POA: Diagnosis not present

## 2014-03-15 DIAGNOSIS — IMO0001 Reserved for inherently not codable concepts without codable children: Secondary | ICD-10-CM | POA: Insufficient documentation

## 2014-03-20 ENCOUNTER — Other Ambulatory Visit (HOSPITAL_BASED_OUTPATIENT_CLINIC_OR_DEPARTMENT_OTHER): Payer: Medicare Other

## 2014-03-20 ENCOUNTER — Ambulatory Visit (HOSPITAL_BASED_OUTPATIENT_CLINIC_OR_DEPARTMENT_OTHER): Payer: Medicare Other | Admitting: Oncology

## 2014-03-20 ENCOUNTER — Encounter: Payer: Medicare Other | Admitting: Physical Therapy

## 2014-03-20 VITALS — BP 122/84 | HR 91 | Temp 98.3°F | Resp 18 | Ht 61.0 in | Wt 243.6 lb

## 2014-03-20 DIAGNOSIS — Z171 Estrogen receptor negative status [ER-]: Secondary | ICD-10-CM

## 2014-03-20 DIAGNOSIS — D702 Other drug-induced agranulocytosis: Secondary | ICD-10-CM

## 2014-03-20 DIAGNOSIS — C50412 Malignant neoplasm of upper-outer quadrant of left female breast: Secondary | ICD-10-CM

## 2014-03-20 DIAGNOSIS — C773 Secondary and unspecified malignant neoplasm of axilla and upper limb lymph nodes: Secondary | ICD-10-CM | POA: Diagnosis not present

## 2014-03-20 DIAGNOSIS — C50919 Malignant neoplasm of unspecified site of unspecified female breast: Secondary | ICD-10-CM

## 2014-03-20 DIAGNOSIS — Z85038 Personal history of other malignant neoplasm of large intestine: Secondary | ICD-10-CM | POA: Diagnosis not present

## 2014-03-20 LAB — COMPREHENSIVE METABOLIC PANEL (CC13)
ALT: 65 U/L — ABNORMAL HIGH (ref 0–55)
AST: 19 U/L (ref 5–34)
Albumin: 3.5 g/dL (ref 3.5–5.0)
Alkaline Phosphatase: 140 U/L (ref 40–150)
Anion Gap: 10 mEq/L (ref 3–11)
BILIRUBIN TOTAL: 1.26 mg/dL — AB (ref 0.20–1.20)
BUN: 17.8 mg/dL (ref 7.0–26.0)
CO2: 25 mEq/L (ref 22–29)
CREATININE: 0.9 mg/dL (ref 0.6–1.1)
Calcium: 10 mg/dL (ref 8.4–10.4)
Chloride: 105 mEq/L (ref 98–109)
GLUCOSE: 127 mg/dL (ref 70–140)
Potassium: 3.9 mEq/L (ref 3.5–5.1)
Sodium: 140 mEq/L (ref 136–145)
TOTAL PROTEIN: 7.4 g/dL (ref 6.4–8.3)

## 2014-03-20 LAB — CBC WITH DIFFERENTIAL/PLATELET
BASO%: 1.7 % (ref 0.0–2.0)
Basophils Absolute: 0 10*3/uL (ref 0.0–0.1)
EOS ABS: 0.1 10*3/uL (ref 0.0–0.5)
EOS%: 4.5 % (ref 0.0–7.0)
HCT: 39.7 % (ref 34.8–46.6)
HGB: 13.1 g/dL (ref 11.6–15.9)
LYMPH%: 72.2 % — AB (ref 14.0–49.7)
MCH: 29.2 pg (ref 25.1–34.0)
MCHC: 32.9 g/dL (ref 31.5–36.0)
MCV: 88.8 fL (ref 79.5–101.0)
MONO#: 0.1 10*3/uL (ref 0.1–0.9)
MONO%: 4.9 % (ref 0.0–14.0)
NEUT#: 0.2 10*3/uL — CL (ref 1.5–6.5)
NEUT%: 16.7 % — ABNORMAL LOW (ref 38.4–76.8)
PLATELETS: 224 10*3/uL (ref 145–400)
RBC: 4.47 10*6/uL (ref 3.70–5.45)
RDW: 13.5 % (ref 11.2–14.5)
WBC: 1.4 10*3/uL — ABNORMAL LOW (ref 3.9–10.3)
lymph#: 1 10*3/uL (ref 0.9–3.3)

## 2014-03-20 MED ORDER — CIPROFLOXACIN HCL 500 MG PO TABS: 500.0000 mg | ORAL_TABLET | Freq: Two times a day (BID) | ORAL | Status: DC

## 2014-03-20 NOTE — Progress Notes (Signed)
[place]  [phone] [phone]) [phone]     ID: [name] DOB: 1951-03-02  MR#: [id number]  BEM#:[id number]  PCP: Ruth Greenland, MD GYN: SU: [name] OTHER MD: [name]  CHIEF COMPLAINT: Stage III breast Lopez  CURRENT TREATMENT: Neoadjuvant chemotherapy   BREAST Lopez HISTORY: [name] herself palpated a mass in her left breast, and immediately brought it to Ruth attention of her primary care physician, Dr. Baird Lopez, who confirmed Ruth finding and setup Ruth patient for [facility]2014. This showed a high density mass in Ruth outer left breast measuring up to 3.7 cm. It corresponded to Ruth site of palpable concern. In addition Ruth normal 4 logically abnormal lymph nodes in Ruth left axilla. Ruth mass was palpable by exam, and by ultrasonography measured 2.6 cm. There were enlarged and morphologically abnormal lymph nodes in Ruth left axilla, largest measuring 3 cm.  Biopsy of Ruth left breast mass in question and 1 of Ruth abnormal axillary lymph nodes 02/22/2014 showed ([id number]) biopsies to be positive for an invasive ductal carcinoma, grade 3, triple negative, with an MIB-1 of 90%. Specifically Ruth HER-2 signals ratio was 1.05, and Ruth number per cell 2.00.  MRI of Ruth breasts 03/01/2014 showed a 3.8 cm enhancing mass with areas of apparent necrosis in Ruth left breast, as well as Ruth biopsy tract extending laterally, Ruth combined measure 5.3 cm. There were no other masses or areas of abnormal enhancement. There were multiple abnormally enlarged left axillary lymph nodes with loss of Ruth normal fatty hilum. These included level I and retropectoral lymph nodes.  Ruth patient's subsequent history is as detailed below  INTERVAL HISTORY: [name] returns today for followup of her breast Lopez accompanied by her granddaughter [name]. Today is day 8 cycle 1 of 4 planned cycles of doxorubicin and  cyclophosphamide given every 2 weeks with Neulasta support, to be followed by weekly carboplatin and paclitaxel x12  REVIEW OF SYSTEMS: [name] did remarkably well with her first chemotherapy treatment. She "felt good". She did feel a bit tired particularly days 2 and 3, and mostly rested those days, taking little walks in Ruth house only. She has had no fever, mouth sores, nausea, vomiting, dizziness, cough, or any change in bowel or bladder habits. She tolerated her antinausea medicines with no side effects that she is aware of and she had no pain from Ruth Neulasta shot. A detailed review of systems today was otherwise noncontributory  PAST MEDICAL HISTORY: Past Medical History  Diagnosis Date  . Colon Lopez   . Hx of rotator cuff surgery     PAST SURGICAL HISTORY: Past Surgical History  Procedure Laterality Date  . Abdominal hysterectomy    . Colon surgery      colectomy/colostomy with takedown    FAMILY HISTORY Family History  Problem Relation Age of Onset  . Breast Lopez Mother    Ruth patient's father died at Ruth age of 69 following a stroke in Ruth setting of diabetes; Ruth patient's mother is living at 67. Ruth patient has 3 brothers and 2 sisters. Ruth patient's mother, [name], was diagnosed with breast Lopez in her early 52s. There is no other breast or ovarian Lopez in Ruth family. Ruth patient herself was diagnosed with watts by history he is an incidentally found stage I colon carcinoma noted at Ruth time of her hysterectomy. She had a partial colectomy but no adjuvant treatment. We have no records regarding that procedure  GYNECOLOGIC HISTORY:  No LMP recorded. Patient has had a hysterectomy. Menarche age 52, first live birth age 40, Ruth patient underwent total abdominal hysterectomy with bilateral salpingo-oophorectomy in her 4s. She did not take hormone replacement. She did not take oral contraceptives at any point.  SOCIAL HISTORY:  [name] work for [name] for about 40  years, retiring in 2008. She is divorced and lives alone, with no pets. Her daughter [name] works for [name] in Press photographer. Ruth second daughter, [name], works as a Research scientist (physical sciences) for [name]. Ruth patient has 6 grandchildren and one great-grandchild. She attends a [name].    ADVANCED DIRECTIVES: Not in place. Ruth patient intends to name her daughter [name]. [name] is work number is [id number]. On Ruth patient's 03/01/2014 visit she was given a copy of Ruth appropriate documents to complete and notarize at her discretion   HEALTH MAINTENANCE: History  Substance Use Topics  . Smoking status: Never Smoker   . Smokeless tobacco: Not on file  . Alcohol Use: No     Colonoscopy: 2000  PAP:  Bone density: 05/23/2004, at [facility] hospital; normal  Lipid panel:  No Known Allergies  Current Outpatient Prescriptions  Medication Sig Dispense Refill  . ciprofloxacin (CIPRO) 500 MG tablet Take 1 tablet (500 mg total) by mouth 2 (two) times daily. As directed  35 tablet  0  . acetaminophen (TYLENOL) 500 MG tablet Take 1,000 mg by mouth every 6 (six) hours as needed for mild pain or moderate pain.      Marland Kitchen dexamethasone (DECADRON) 4 MG tablet Take 2 tablets by mouth once a day on Ruth day after chemotherapy and then take 2 tablets two times a day for 2 days. Take with food.  30 tablet  1  . lidocaine-prilocaine (EMLA) cream Apply 1 application topically once. Apply to port 1-2 hours prior to access.  30 g  1  . LORazepam (ATIVAN) 0.5 MG tablet Take 1 tablet (0.5 mg total) by mouth every 6 (six) hours as needed (Nausea or vomiting).  30 tablet  0  . ondansetron (ZOFRAN) 8 MG tablet Take 1 tablet (8 mg total) by mouth 2 (two) times daily as needed. Start on Ruth third day after chemotherapy.  30 tablet  1  . prochlorperazine (COMPAZINE) 10 MG tablet Take 1 tablet (10 mg total) by mouth every 6 (six) hours as needed (Nausea or vomiting).  30 tablet  1     No current facility-administered medications for this visit.    OBJECTIVE: Middle-aged [place] American woman who appears stated age 63 Vitals:   03/20/14 0903  BP: 122/84  Pulse: 91  Temp: 98.3 F (36.8 C)  Resp: 18     Body mass index is 46.05 kg/(m^2).    ECOG FS:0 - Asymptomatic  Ocular: Sclerae unicteric, EOMs intact Ear-nose-throat: Oropharynx clear and moist Lymphatic: No cervical or supraclavicular adenopathy Lungs no rales or rhonchi Heart regular rate and rhythm Abd soft, obese, nontender, positive bowel sounds MSK no focal spinal tenderness Neuro: non-focal, well-oriented, positive affect Breasts: Deferred Skin: Some hyperpigmentation already noted over Ruth hands  LAB RESULTS:  CMP     Component Value Date/Time   NA 140 03/20/2014 0852   NA 142 04/13/2010 0403   K 3.9 03/20/2014 0852   K 3.4* 04/13/2010 0403   CL 107 04/13/2010 0403   CO2 25 03/20/2014 0852   CO2 29 04/13/2010 0403   GLUCOSE 127 03/20/2014 6967  GLUCOSE 110* 04/13/2010 0403   BUN 17.8 03/20/2014 0852   BUN 5* 04/13/2010 0403   CREATININE 0.9 03/20/2014 0852   CREATININE 0.81 04/13/2010 0403   CALCIUM 10.0 03/20/2014 0852   CALCIUM 8.4 04/13/2010 0403   PROT 7.4 03/20/2014 0852   PROT 6.9 04/05/2008 1120   ALBUMIN 3.5 03/20/2014 0852   ALBUMIN 3.6 04/05/2008 1120   AST 19 03/20/2014 0852   AST 16 04/05/2008 1120   ALT 65* 03/20/2014 0852   ALT 14 04/05/2008 1120   ALKPHOS 140 03/20/2014 0852   ALKPHOS 103 04/05/2008 1120   BILITOT 1.26* 03/20/2014 0852   BILITOT 1.2 04/05/2008 1120   GFRNONAA >60 04/13/2010 0403   GFRAA  Value: >60        Ruth eGFR has been calculated using Ruth MDRD equation. This calculation has not been validated in all clinical situations. eGFR's persistently <60 mL/min signify possible Chronic Kidney Disease. 04/13/2010 0403    I No results found for this basename: SPEP,  UPEP,   kappa and lambda light chains    Lab Results  Component Value Date   WBC 1.4* 03/20/2014   NEUTROABS 0.2*  03/20/2014   HGB 13.1 03/20/2014   HCT 39.7 03/20/2014   MCV 88.8 03/20/2014   PLT 224 03/20/2014      Chemistry      Component Value Date/Time   NA 140 03/20/2014 0852   NA 142 04/13/2010 0403   K 3.9 03/20/2014 0852   K 3.4* 04/13/2010 0403   CL 107 04/13/2010 0403   CO2 25 03/20/2014 0852   CO2 29 04/13/2010 0403   BUN 17.8 03/20/2014 0852   BUN 5* 04/13/2010 0403   CREATININE 0.9 03/20/2014 0852   CREATININE 0.81 04/13/2010 0403      Component Value Date/Time   CALCIUM 10.0 03/20/2014 0852   CALCIUM 8.4 04/13/2010 0403   ALKPHOS 140 03/20/2014 0852   ALKPHOS 103 04/05/2008 1120   AST 19 03/20/2014 0852   AST 16 04/05/2008 1120   ALT 65* 03/20/2014 0852   ALT 14 04/05/2008 1120   BILITOT 1.26* 03/20/2014 0852   BILITOT 1.2 04/05/2008 1120       No results found for this basename: LABCA2    No components found with this basename: [id number]    No results found for this basename: INR,  in Ruth last 168 hours  Urinalysis    Component Value Date/Time   COLORURINE YELLOW 04/05/2008 1120   APPEARANCEUR CLEAR 04/05/2008 1120   LABSPEC 1.021 04/05/2008 1120   PHURINE 6.0 04/05/2008 1120   GLUCOSEU NEGATIVE 04/05/2008 1120   HGBUR NEGATIVE 04/05/2008 1120   BILIRUBINUR NEGATIVE 04/05/2008 1120   KETONESUR NEGATIVE 04/05/2008 1120   PROTEINUR NEGATIVE 04/05/2008 1120   UROBILINOGEN 0.2 04/05/2008 1120   NITRITE NEGATIVE 04/05/2008 1120   LEUKOCYTESUR NEGATIVE MICROSCOPIC NOT DONE ON URINES WITH NEGATIVE PROTEIN, BLOOD, LEUKOCYTES, NITRITE, OR GLUCOSE <1000 mg/dL. 04/05/2008 1120    STUDIES: Ct Chest W Contrast  03/06/2014   CLINICAL DATA:  New diagnosis of left-sided breast Lopez.  EXAM: CT CHEST WITH CONTRAST  TECHNIQUE: Multidetector CT imaging of Ruth chest was performed during intravenous contrast administration.  CONTRAST:  55m OMNIPAQUE IOHEXOL 300 MG/ML  SOLN  COMPARISON:  Today's PET, dictated separately. Breast MRI of 03/01/2014. Chest CT 04/12/2010  FINDINGS: Lungs/Pleura: 4 mm right upper lobe lung  nodule on image 16 is similar to image 28 of Ruth prior chest CT, consistent with a benign etiology.  Right lower  lobe 4 mm nodule on image 47/ series 5. Not readily apparent on Ruth prior chest CT.  Favor dependent scarring in Ruth right lower lobe more superiorly on image 31.  No pleural fluid.  Heart/Mediastinum: No supraclavicular adenopathy. No right axillary adenopathy. Left axillary adenopathy. Index node measures 2.4 x 2.3 cm on image 11.  Left paracentral deep lateral breast mass measures 3.2 x 2.9 cm on image 14.  Bilateral low-density thyroid nodules. Ruth dominant 2.9 cm lesion on image 7 corresponds to mild hypermetabolism on PET. Grossly similar to on Ruth 04/12/2010 exam.  Mild cardiomegaly, without pericardial effusion. No central pulmonary embolism, on this non-dedicated study. No mediastinal or hilar adenopathy. A small hiatal hernia. Fluid level in Ruth thoracic esophagus on image 18.  Upper Abdomen: Normal imaged portions of Ruth liver, spleen, distal stomach, pancreas, adrenal glands, kidneys, gallbladder, biliary tract. Billings within Ruth splenic flexure Ruth colon.  Bones/Musculoskeletal:  Right shoulder arthroplasty.  IMPRESSION: 1. Left breast primary with left axillary nodal metastasis. 2. Right-sided lung nodules. A lower lobe nodule is not readily apparent on Ruth prior exam and warrants followup attention. 3. Bilateral thyroid nodules with a dominant right-sided lesion which is grossly similar in size back to 2011, but corresponds with mild hypermetabolism at PET. Consider ultrasound. 4. Small hiatal hernia. Esophageal air fluid level suggests dysmotility or gastroesophageal reflux.   Electronically Signed   By: Abigail Miyamoto M.D.   On: 03/06/2014 12:48   Mr Breast Bilateral W Wo Contrast  03/01/2014   CLINICAL DATA:  Recently diagnosed left breast invasive ductal carcinoma and metastatic left axillary lymph node.  LABS:  BUN and creatinine were obtained on site at [facility] at   [address]  Results:  BUN 9 mg/dL,  Creatinine 1.0 mg/dL.  EXAM: BILATERAL BREAST MRI WITH AND WITHOUT CONTRAST  TECHNIQUE: Multiplanar, multisequence MR images of both breasts were obtained prior to and following Ruth intravenous administration of 90m of MultiHance.  THREE-DIMENSIONAL MR IMAGE RENDERING ON INDEPENDENT WORKSTATION:  Three-dimensional MR images were rendered by post-processing of Ruth original MR data on an independent workstation. Ruth three-dimensional MR images were interpreted, and findings are reported in Ruth following complete MRI report for this study. Three dimensional images were evaluated at Ruth independent DynaCad workstation  COMPARISON:  Recent mammogram, ultrasound and biopsy examinations.  FINDINGS: Breast composition: b.  Scattered fibroglandular tissue.  Background parenchymal enhancement: Minimal  Right breast: No mass or abnormal enhancement.  Left breast: 3.8 x 3.6 x 2.9 cm rounded enhancing mass with areas of nonenhancement compatible with necrosis in Ruth 12 o'clock position of Ruth left breast, containing a biopsy marker clip artifact. There is also enhancement surrounding Ruth biopsy tract extending lateral to Ruth mass. Ruth tract and mass combined measure 5.3 cm in maximum diameter. Ruth mass and enhancement surrounding Ruth biopsy tract have persistent and plateau enhancement kinetics. No additional masses or areas of abnormal enhancement suspicious for malignancy in Ruth left breast.  Lymph nodes: Multiple abnormally enlarged, rounded left axillary lymph nodes with loss of Ruth normal fatty hila. These include level 1 and retropectoral lymph nodes.  Ancillary findings:  None.  IMPRESSION: 1. 3.8 x 3.6 x 2.9 cm biopsy-proven invasive ductal carcinoma in Ruth 12 o'clock position of Ruth left breast with adjacent post biopsy changes extending laterally, with a combined maximum diameter of 5.3 cm. 2. Multiple metastatic Level 1 and Level 2 left axillary lymph nodes. 3. No  evidence of malignancy on Ruth right.  RECOMMENDATION: Treatment plan.  BI-RADS CATEGORY  6: Known biopsy-proven malignancy.   Electronically Signed   By: Enrique Sack M.D.   On: 03/01/2014 09:56   Nm Pet Image Initial (pi) Skull Base To Thigh  03/06/2014   CLINICAL DATA:  Initial treatment strategy for left-sided breast Lopez. Positive lymph nodes.  EXAM: NUCLEAR MEDICINE PET SKULL BASE TO THIGH  TECHNIQUE: 15.2 mCi F-18 FDG was injected intravenously. Full-ring PET imaging was performed from Ruth skull base to thigh after Ruth radiotracer. CT data was obtained and used for attenuation correction and anatomic localization.  FASTING BLOOD GLUCOSE:  Value: 86 mg/dl  COMPARISON:  Breast MR a of 03/01/2014.  FINDINGS: NECK  Mild hypermetabolism corresponding to enlargement and heterogeneity of Ruth right lobe of Ruth thyroid. This measures a S.U.V. max of 5.3 on image 30/series 4. Ill-defined hypoattenuation in this area measures 3.1 x 3.1 cm.  CHEST  Deep left paracentral breast mass which measures 3.1 cm and a S.U.V. max of 15.8 on image 43/series 4.  Left axillary nodal metastasis. An index node measures 2.5 x 2.4 cm and a S.U.V. max of 12.4 on image 42.  ABDOMEN/PELVIS  Hypermetabolism corresponding with underdistention involving Ruth sigmoid colon and rectum. This measures a S.U.V. max of 15.3, including on image 148. There are surgical changes within Ruth surrounding mesocolon, with suggestion of prominent perirectal vessels.  SKELETON  Likely degenerative hypermetabolism about Ruth sternal manubrial joint.  CT IMAGES PERFORMED FOR ATTENUATION CORRECTION  Mild degradation secondary to patient body habitus. No significant findings within Ruth neck. Chest findings deferred to today's chest CT, not yet performed. Small hiatal hernia. Mild cardiomegaly.  Left paracentral pelvic anterior wall hernia containing nonobstructive small bowel. Hysterectomy. 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 Left glenohumeral joint osteoarthritis with prior right glenohumeral joint arthroplasty.  IMPRESSION: 1. Left breast primary with left axillary nodal metastasis. 2. No evidence of extrathoracic hypermetabolic metastatic disease. 3. Please see chest CT, scheduled for later today. 4. Right thyroid enlargement and heterogeneity with dominant right-sided mass. Consider thyroid ultrasound for further evaluation. 5. Hypermetabolism within Ruth sigmoid and rectum which could be physiologic or due to colitis. Questionable surrounding hyperemia and surgical changes in this area. 6. Incidental findings, including small bowel containing left pelvic wall hernia.   Electronically Signed   By: Abigail Miyamoto M.D.   On: 03/06/2014 11:56   Ir Fluoro Guide Cv Line Right  03/08/2014   CLINICAL DATA:  Breast Lopez  EXAM: TUNNEL POWER PORT PLACEMENT WITH SUBCUTANEOUS POCKET UTILIZING ULTRASOUND & FLOUROSCOPY  FLUOROSCOPY TIME:  36 seconds.  MEDICATIONS AND MEDICAL HISTORY: Versed two mg, Fentanyl 50 mcg.  As antibiotic prophylaxis, Ancef was ordered pre-procedure and administered intravenously within one hour of incision.  ANESTHESIA/SEDATION: Moderate sedation time: 30 minutes  CONTRAST:  None  PROCEDURE: After written informed consent was obtained, patient was placed in Ruth supine position on angiographic table. Ruth right neck and chest was prepped and draped in a sterile fashion. Lidocaine was utilized for local anesthesia. Ruth right internal jugular vein was noted to be patent initially with ultrasound. Under sonographic guidance, a micropuncture needle was inserted into Ruth right IJ vein (Ultrasound and fluoroscopic image documentation was performed). Ruth needle was removed over an 018 wire which was exchanged for a Amplatz. This was advanced into Ruth IVC. An 8-French dilator was advanced over Ruth Amplatz.  A small incision was made in Ruth right upper chest over Ruth anterior right second rib.  Utilizing blunt  dissection, a subcutaneous pocket was created in Ruth caudal direction. Ruth pocket was irrigated with a copious amount of sterile normal saline. Ruth port catheter was tunneled from Ruth chest incision, and out Ruth neck incision. Ruth reservoir was inserted into Ruth subcutaneous pocket and secured with two 3-0 Ethilon stitches. A peel-away sheath was advanced over Ruth Amplatz wire. Ruth port catheter was cut to measure length and inserted through Ruth peel-away sheath. Ruth peel-away sheath was removed. Ruth chest incision was closed with 3-0 Vicryl interrupted stitches for Ruth subcutaneous tissue and a running of 4-0 Vicryl subcuticular stitch for Ruth skin. Ruth neck incision was closed with a 4-0 Vicryl subcuticular stitch. Derma-bond was applied to both surgical incisions. Ruth port reservoir was flushed and instilled with heparinized saline. No complications.  FINDINGS: A right IJ vein Port-A-Cath is in place with its tip at Ruth cavoatrial junction.  IMPRESSION: Successful 8 French right internal jugular vein power port placement with its tip at Ruth SVC/RA junction.   Electronically Signed   By: Maryclare Bean M.D.   On: 03/08/2014 13:58   Ir US Guide Vasc Access Right  03/08/2014   CLINICAL DATA:  Breast Lopez  EXAM: TUNNEL POWER PORT PLACEMENT WITH SUBCUTANEOUS POCKET UTILIZING ULTRASOUND & FLOUROSCOPY  FLUOROSCOPY TIME:  36 seconds.  MEDICATIONS AND MEDICAL HISTORY: Versed two mg, Fentanyl 50 mcg.  As antibiotic prophylaxis, Ancef was ordered pre-procedure and administered intravenously within one hour of incision.  ANESTHESIA/SEDATION: Moderate sedation time: 30 minutes  CONTRAST:  None  PROCEDURE: After written informed consent was obtained, patient was placed in Ruth supine position on angiographic table. Ruth right neck and chest was prepped and draped in a sterile fashion. Lidocaine was utilized for local anesthesia. Ruth right internal jugular vein was noted to be patent initially with ultrasound. Under sonographic  guidance, a micropuncture needle was inserted into Ruth right IJ vein (Ultrasound and fluoroscopic image documentation was performed). Ruth needle was removed over an 018 wire which was exchanged for a Amplatz. This was advanced into Ruth IVC. An 8-French dilator was advanced over Ruth Amplatz.  A small incision was made in Ruth right upper chest over Ruth anterior right second rib. Utilizing blunt dissection, a subcutaneous pocket was created in Ruth caudal direction. Ruth pocket was irrigated with a copious amount of sterile normal saline. Ruth port catheter was tunneled from Ruth chest incision, and out Ruth neck incision. Ruth reservoir was inserted into Ruth subcutaneous pocket and secured with two 3-0 Ethilon stitches. A peel-away sheath was advanced over Ruth Amplatz wire. Ruth port catheter was cut to measure length and inserted through Ruth peel-away sheath. Ruth peel-away sheath was removed. Ruth chest incision was closed with 3-0 Vicryl interrupted stitches for Ruth subcutaneous tissue and a running of 4-0 Vicryl subcuticular stitch for Ruth skin. Ruth neck incision was closed with a 4-0 Vicryl subcuticular stitch. Derma-bond was applied to both surgical incisions. Ruth port reservoir was flushed and instilled with heparinized saline. No complications.  FINDINGS: A right IJ vein Port-A-Cath is in place with its tip at Ruth cavoatrial junction.  IMPRESSION: Successful 8 French right internal jugular vein power port placement with its tip at Ruth SVC/RA junction.   Electronically Signed   By: Maryclare Bean M.D.   On: 03/08/2014 13:58   Mm Digital Diagnostic Bilat  02/20/2014   CLINICAL DATA:  63 year old female with a left breast palpable abnormality. Ruth patient had a previously seen probably benign asymmetry in Ruth left  breast which she failed to follow-up.  EXAM: DIGITAL DIAGNOSTIC  LEFT MAMMOGRAM WITH CAD  ULTRASOUND LEFT BREAST  COMPARISON:  Previous exams.  ACR Breast Density Category b: There are scattered areas of  fibroglandular density.  FINDINGS: There is a high density mass in Ruth outer left breast with associated distortion measuring up to 3.7 cm and confirmed on Ruth additional spot compression tangential view. This corresponds to Ruth site of palpable concern in Ruth left breast. In addition, there are morphologically abnormal lymph nodes in Ruth left axilla.  Mammographic images were processed with CAD.  Physical examination at site of palpable concern in Ruth left breast reveals a firm mass at Ruth approximate 12 to 1 o'clock position.  Targeted ultrasound of Ruth left breast was performed demonstrating an irregular hypoechoic mass at 1 o'clock 2 cm from Ruth nipple measuring 2.6 x 2.5 x 2.6 cm. This corresponds with mammography findings.  Enlarged and morphologically abnormal lymph nodes are seen in Ruth left axilla, Ruth largest of which measures 3 x 2.1 x 2 cm.  IMPRESSION: Highly suspicious left breast mass and left axillary lymphadenopathy.  RECOMMENDATION: Ultrasound-guided biopsy of Ruth mass in Ruth left breast as well as Ruth enlarged left axillary lymph node is recommended. This is scheduled for Wednesday June 10th at 11:30 a.m.  I have discussed Ruth findings and recommendations with Ruth patient. Results were also provided in writing at Ruth conclusion of Ruth visit. If applicable, a reminder letter will be sent to Ruth patient regarding Ruth next appointment.  BI-RADS CATEGORY  5: Highly suggestive of malignancy.   Electronically Signed   By: Everlean Alstrom M.D.   On: 02/20/2014 13:42   Mm Digital Diagnostic Unilat L  02/22/2014   CLINICAL DATA:  Post biopsy of a highly suspicious left breast mass and left axillary lymph node.  EXAM: DIAGNOSTIC LEFT MAMMOGRAM POST ULTRASOUND BIOPSY  COMPARISON:  Previous exams  FINDINGS: Mammographic images were obtained following ultrasound guided biopsy of a highly suspicious mass in Ruth upper left breast as well as of a highly suspicious left axillary lymph node. A ribbon shaped  biopsy marking clip is present in Ruth targeted region of Ruth mass.  IMPRESSION: Appropriate positioning of ribbon shaped biopsy marking clip in Ruth left breast post biopsy of a highly suspicious mass at 12 o'clock.  Final Assessment: Post Procedure Mammograms for Marker Placement   Electronically Signed   By: Everlean Alstrom M.D.   On: 02/22/2014 12:33   US Breast Ltd Uni Left Inc Axilla  02/20/2014   CLINICAL DATA:  63 year old female with a left breast palpable abnormality. Ruth patient had a previously seen probably benign asymmetry in Ruth left breast which she failed to follow-up.  EXAM: DIGITAL DIAGNOSTIC  LEFT MAMMOGRAM WITH CAD  ULTRASOUND LEFT BREAST  COMPARISON:  Previous exams.  ACR Breast Density Category b: There are scattered areas of fibroglandular density.  FINDINGS: There is a high density mass in Ruth outer left breast with associated distortion measuring up to 3.7 cm and confirmed on Ruth additional spot compression tangential view. This corresponds to Ruth site of palpable concern in Ruth left breast. In addition, there are morphologically abnormal lymph nodes in Ruth left axilla.  Mammographic images were processed with CAD.  Physical examination at site of palpable concern in Ruth left breast reveals a firm mass at Ruth approximate 12 to 1 o'clock position.  Targeted ultrasound of Ruth left breast was performed demonstrating an irregular hypoechoic mass at 1 o'clock 2  cm from Ruth nipple measuring 2.6 x 2.5 x 2.6 cm. This corresponds with mammography findings.  Enlarged and morphologically abnormal lymph nodes are seen in Ruth left axilla, Ruth largest of which measures 3 x 2.1 x 2 cm.  IMPRESSION: Highly suspicious left breast mass and left axillary lymphadenopathy.  RECOMMENDATION: Ultrasound-guided biopsy of Ruth mass in Ruth left breast as well as Ruth enlarged left axillary lymph node is recommended. This is scheduled for Wednesday June 10th at 11:30 a.m.  I have discussed Ruth findings and  recommendations with Ruth patient. Results were also provided in writing at Ruth conclusion of Ruth visit. If applicable, a reminder letter will be sent to Ruth patient regarding Ruth next appointment.  BI-RADS CATEGORY  5: Highly suggestive of malignancy.   Electronically Signed   By: Everlean Alstrom M.D.   On: 02/20/2014 13:42   [place] Lt Breast Bx W Loc Dev 1st Lesion Img Bx Spec US Guide  02/23/2014   ADDENDUM REPORT: 02/23/2014 16:25  ADDENDUM: Ruth PATHOLOGY ASSOCIATED WITH Ruth LEFT BREAST ULTRASOUND-GUIDED CORE BIOPSY DEMONSTRATED GRADE 3 INVASIVE DUCTAL CARCINOMA. Ruth PATHOLOGY ASSOCIATED WITH Ruth LEFT AXILLARY LYMPH NODE ULTRASOUND-GUIDED CORE BIOPSY DEMONSTRATED METASTATIC CARCINOMA WITHIN Ruth LYMPH NODE. Ruth PATHOLOGY IS CONCORDANT WITH Ruth IMAGING FINDINGS. I HAVE DISCUSSED FINDINGS WITH Ruth PATIENT BY TELEPHONE AND ANSWERED HER QUESTIONS. PATIENT STATES HER BIOPSY SITES ARE CLEAN AND DRY WITHOUT HEMATOMA FORMATION OR SIGNS OF INFECTION. THERE IS MILD TENDERNESS. Ruth PATIENT IS SCHEDULED FOR Ruth BREAST [place] ON 06/17 2015. Ruth PATIENT IS ALSO SCHEDULED FOR BREAST MRI ON 03/01/2014 AT 7:15 A.M. POST BIOPSY WOUND CARE INSTRUCTIONS WERE REVIEWED WITH Ruth PATIENT. Ruth PATIENT WAS ENCOURAGED TO CALL OUR BREAST [facility]ENTER FOR ADDITIONAL QUESTIONS OR CONCERNS.   Electronically Signed   By: Luberta Robertson M.D.   On: 02/23/2014 16:25   02/23/2014   CLINICAL DATA:  Highly suspicious left breast mass and left axillary lymph node.  EXAM: ULTRASOUND GUIDED LEFT BREAST CORE NEEDLE BIOPSY  COMPARISON:  Previous exams.  PROCEDURE: I met with Ruth patient and we discussed Ruth procedure of ultrasound-guided biopsy, including benefits and alternatives. We discussed Ruth high likelihood of a successful procedure. We discussed Ruth risks of Ruth procedure including infection, bleeding, tissue injury, clip migration, and inadequate sampling. Informed written consent was given. Ruth usual time-out protocol was  performed immediately prior to Ruth procedure.  1. Using sterile technique and 2% Lidocaine as local anesthetic, under direct ultrasound visualization, a 12 gauge vacuum-assisteddevice was used to perform biopsy of highly suspicious mass in Ruth left breast at 12 o'clock using a lateral approach. At Ruth conclusion of Ruth procedure, a ribbon shaped tissue marker clip was deployed into Ruth biopsy cavity. Follow-up 2-view mammogram was performed and dictated separately.  2. Using sterile technique and 2% Lidocaine as local anesthetic, under direct ultrasound visualization, a 12 gauge vacuum-assisteddevice was used to perform biopsy of Ruth enlarged and morphologically abnormal lymph node in Ruth low left axilla using a lateral approach.  IMPRESSION: Ultrasound-guided biopsy of a highly suspicious mass in Ruth left breast as well as of a highly suspicious left axillary lymph node. No apparent complications.  Electronically Signed: By: Everlean Alstrom M.D. On: 02/22/2014 12:09   [place] Lt Breast Bx W Loc Dev Ea Add Lesion Img Bx Spec US Guide  lly Signed: By: Everlean Alstrom M.D. On: 02/22/2014 12:09   ASSESSMENT: 63 y.o. [place] woman s/p breast and left axillary lymph node biopsy 02/22/2014, both positive  for a clinical T2 N2, stage IIIA invasive ductal carcinoma, grade 3, triple negative, with an MIB-1 of 90%  (1) neoadjuvant chemotherapy started 03/13/2014, with cyclophosphamide and doxorubicin in dose dense fashion x4, with Neulasta support on day 2, to be followed by weekly carboplatin and paclitaxel x12  (2) definitive surgery to follow chemotherapy  (3) radiation to follow surgery  (4) remote history of partial colectomy for early stage colon Lopez incidentally found during TAH/BSO in Ruth 1980s  (5) genetics testing pending  (6) staging studies showed right-sided lung nodules, too small to characterize, and a dominant right-sided thyroid nodule unchanged in size as compared to 2011  PLAN: Akaisha  did remarkably well with her first chemotherapy treatment. She is now significantly neutropenic, as expected, and I am starting her on ciprofloxacin 500 mg to take twice a day for Ruth next 4 days. I gave her enough so that she will be able to repeat this after each a.c. dose.  Otherwise she is a ready scheduled for an appointment next week with lab work and treatment to follow. I am adding appointments for her final 2 AC treatments as well. We discussed Ruth hyperpigmentation, which she knows we'll take a long time to clear, and she really has her wig that she is planning to use as soon as her hair falls out.  Ruth patient has a good understanding of Ruth overall plan. She agrees with it. She knows Ruth goal of treatment in her case is cure. She will call with any problems that may develop before her next visit here.  Chauncey Cruel, MD   03/20/2014 9:26 AM

## 2014-03-21 ENCOUNTER — Ambulatory Visit: Payer: Medicare Other

## 2014-03-21 DIAGNOSIS — IMO0001 Reserved for inherently not codable concepts without codable children: Secondary | ICD-10-CM | POA: Diagnosis not present

## 2014-03-22 ENCOUNTER — Telehealth: Payer: Self-pay | Admitting: Physician Assistant

## 2014-03-22 ENCOUNTER — Ambulatory Visit: Payer: Medicare Other | Admitting: Physical Therapy

## 2014-03-22 DIAGNOSIS — IMO0001 Reserved for inherently not codable concepts without codable children: Secondary | ICD-10-CM | POA: Diagnosis not present

## 2014-03-22 NOTE — Telephone Encounter (Signed)
, °

## 2014-03-24 ENCOUNTER — Ambulatory Visit: Payer: Medicare Other | Admitting: Physical Therapy

## 2014-03-24 DIAGNOSIS — IMO0001 Reserved for inherently not codable concepts without codable children: Secondary | ICD-10-CM | POA: Diagnosis not present

## 2014-03-27 ENCOUNTER — Telehealth: Payer: Self-pay | Admitting: *Deleted

## 2014-03-27 ENCOUNTER — Encounter: Payer: Self-pay | Admitting: Oncology

## 2014-03-27 ENCOUNTER — Other Ambulatory Visit (HOSPITAL_BASED_OUTPATIENT_CLINIC_OR_DEPARTMENT_OTHER): Payer: Medicare Other

## 2014-03-27 ENCOUNTER — Ambulatory Visit: Payer: Medicare Other

## 2014-03-27 ENCOUNTER — Ambulatory Visit (HOSPITAL_BASED_OUTPATIENT_CLINIC_OR_DEPARTMENT_OTHER): Payer: Medicare Other

## 2014-03-27 ENCOUNTER — Other Ambulatory Visit: Payer: Self-pay | Admitting: Oncology

## 2014-03-27 ENCOUNTER — Ambulatory Visit (HOSPITAL_BASED_OUTPATIENT_CLINIC_OR_DEPARTMENT_OTHER): Payer: Medicare Other | Admitting: Oncology

## 2014-03-27 VITALS — BP 129/88 | HR 89 | Temp 98.2°F | Resp 18 | Ht 61.0 in | Wt 244.6 lb

## 2014-03-27 VITALS — BP 124/81 | HR 79 | Temp 98.2°F

## 2014-03-27 DIAGNOSIS — Z5111 Encounter for antineoplastic chemotherapy: Secondary | ICD-10-CM | POA: Diagnosis not present

## 2014-03-27 DIAGNOSIS — C773 Secondary and unspecified malignant neoplasm of axilla and upper limb lymph nodes: Secondary | ICD-10-CM

## 2014-03-27 DIAGNOSIS — Z171 Estrogen receptor negative status [ER-]: Secondary | ICD-10-CM

## 2014-03-27 DIAGNOSIS — Z85038 Personal history of other malignant neoplasm of large intestine: Secondary | ICD-10-CM | POA: Diagnosis not present

## 2014-03-27 DIAGNOSIS — C50919 Malignant neoplasm of unspecified site of unspecified female breast: Secondary | ICD-10-CM

## 2014-03-27 DIAGNOSIS — C50412 Malignant neoplasm of upper-outer quadrant of left female breast: Secondary | ICD-10-CM

## 2014-03-27 DIAGNOSIS — Z95828 Presence of other vascular implants and grafts: Secondary | ICD-10-CM

## 2014-03-27 LAB — CBC WITH DIFFERENTIAL/PLATELET
BASO%: 0.4 % (ref 0.0–2.0)
Basophils Absolute: 0 10*3/uL (ref 0.0–0.1)
EOS ABS: 0 10*3/uL (ref 0.0–0.5)
EOS%: 0.4 % (ref 0.0–7.0)
HEMATOCRIT: 37.1 % (ref 34.8–46.6)
HEMOGLOBIN: 12.1 g/dL (ref 11.6–15.9)
LYMPH#: 1.2 10*3/uL (ref 0.9–3.3)
LYMPH%: 21.1 % (ref 14.0–49.7)
MCH: 28.9 pg (ref 25.1–34.0)
MCHC: 32.6 g/dL (ref 31.5–36.0)
MCV: 88.8 fL (ref 79.5–101.0)
MONO#: 0.4 10*3/uL (ref 0.1–0.9)
MONO%: 7.5 % (ref 0.0–14.0)
NEUT%: 70.6 % (ref 38.4–76.8)
NEUTROS ABS: 3.9 10*3/uL (ref 1.5–6.5)
PLATELETS: 169 10*3/uL (ref 145–400)
RBC: 4.18 10*6/uL (ref 3.70–5.45)
RDW: 13.2 % (ref 11.2–14.5)
WBC: 5.5 10*3/uL (ref 3.9–10.3)

## 2014-03-27 MED ORDER — DOXORUBICIN HCL CHEMO IV INJECTION 2 MG/ML
60.0000 mg/m2 | Freq: Once | INTRAVENOUS | Status: AC
  Administered 2014-03-27: 132 mg via INTRAVENOUS
  Filled 2014-03-27: qty 66

## 2014-03-27 MED ORDER — SODIUM CHLORIDE 0.9 % IJ SOLN
10.0000 mL | INTRAMUSCULAR | Status: DC | PRN
  Administered 2014-03-27: 10 mL via INTRAVENOUS
  Filled 2014-03-27: qty 10

## 2014-03-27 MED ORDER — SODIUM CHLORIDE 0.9 % IJ SOLN
10.0000 mL | INTRAMUSCULAR | Status: DC | PRN
  Administered 2014-03-27: 10 mL
  Filled 2014-03-27: qty 10

## 2014-03-27 MED ORDER — SODIUM CHLORIDE 0.9 % IV SOLN
600.0000 mg/m2 | Freq: Once | INTRAVENOUS | Status: AC
  Administered 2014-03-27: 1320 mg via INTRAVENOUS
  Filled 2014-03-27: qty 66

## 2014-03-27 MED ORDER — DEXAMETHASONE SODIUM PHOSPHATE 20 MG/5ML IJ SOLN
INTRAMUSCULAR | Status: AC
  Filled 2014-03-27: qty 5

## 2014-03-27 MED ORDER — PALONOSETRON HCL INJECTION 0.25 MG/5ML
0.2500 mg | Freq: Once | INTRAVENOUS | Status: AC
  Administered 2014-03-27: 0.25 mg via INTRAVENOUS

## 2014-03-27 MED ORDER — SODIUM CHLORIDE 0.9 % IV SOLN
Freq: Once | INTRAVENOUS | Status: AC
  Administered 2014-03-27: 11:00:00 via INTRAVENOUS

## 2014-03-27 MED ORDER — DEXAMETHASONE SODIUM PHOSPHATE 20 MG/5ML IJ SOLN
12.0000 mg | Freq: Once | INTRAMUSCULAR | Status: AC
  Administered 2014-03-27: 12 mg via INTRAVENOUS

## 2014-03-27 MED ORDER — SODIUM CHLORIDE 0.9 % IV SOLN
150.0000 mg | Freq: Once | INTRAVENOUS | Status: AC
  Administered 2014-03-27: 150 mg via INTRAVENOUS
  Filled 2014-03-27: qty 5

## 2014-03-27 MED ORDER — HEPARIN SOD (PORK) LOCK FLUSH 100 UNIT/ML IV SOLN
500.0000 [IU] | Freq: Once | INTRAVENOUS | Status: AC | PRN
  Administered 2014-03-27: 500 [IU]
  Filled 2014-03-27: qty 5

## 2014-03-27 NOTE — Telephone Encounter (Signed)
Per pharmacy I have scheduled in jection  

## 2014-03-27 NOTE — Progress Notes (Signed)
[place]  Telephone:([phone] Fax:([phone]) [phone]     ID: [name] DOB: 04-08-1951  MR#: [id number]  ANV#:[id number]  PCP: Maximino Greenland, MD GYN: SU: Rolm Bookbinder OTHER MD: Gery Pray  CHIEF COMPLAINT: Stage III breast cancer  CURRENT TREATMENT: Neoadjuvant chemotherapy   BREAST CANCER HISTORY: [name] herself palpated a mass in her left breast, and immediately brought it to the attention of her primary care physician, Dr. Baird Cancer, who confirmed the finding and setup the patient for bilateral diagnostic mammography at the breast Center 02/20/2014. This showed a high density mass in the outer left breast measuring up to 3.7 cm. It corresponded to the site of palpable concern. In addition the normal 4 logically abnormal lymph nodes in the left axilla. The mass was palpable by exam, and by ultrasonography measured 2.6 cm. There were enlarged and morphologically abnormal lymph nodes in the left axilla, largest measuring 3 cm.  Biopsy of the left breast mass in question and 1 of the abnormal axillary lymph nodes 02/22/2014 showed ([id number]) biopsies to be positive for an invasive ductal carcinoma, grade 3, triple negative, with an MIB-1 of 90%. Specifically the HER-2 signals ratio was 1.05, and the number per cell 2.00.  MRI of the breasts 03/01/2014 showed a 3.8 cm enhancing mass with areas of apparent necrosis in the left breast, as well as the biopsy tract extending laterally, the combined measure 5.3 cm. There were no other masses or areas of abnormal enhancement. There were multiple abnormally enlarged left axillary lymph nodes with loss of the normal fatty hilum. These included level I and retropectoral lymph nodes.  The patient's subsequent history is as detailed below  INTERVAL HISTORY: [name] returns today for followup of her breast cancer accompanied by her daughter. Today is day 1 cycle 2 of 4 planned cycles of doxorubicin and cyclophosphamide  given every 2 weeks with Neulasta support, to be followed by weekly carboplatin and paclitaxel x12  REVIEW OF SYSTEMS: Tamura feels well overall today. She reports mild fatigue, but this is not affecting her performance status. She has had no fever, mouth sores, nausea, vomiting, dizziness, cough, or any change in bowel or bladder habits. A detailed review of systems today was otherwise noncontributory  PAST MEDICAL HISTORY: Past Medical History  Diagnosis Date  . Colon cancer   . Hx of rotator cuff surgery     PAST SURGICAL HISTORY: Past Surgical History  Procedure Laterality Date  . Abdominal hysterectomy    . Colon surgery      colectomy/colostomy with takedown    FAMILY HISTORY Family History  Problem Relation Age of Onset  . Breast cancer Mother    the patient's father died at the age of 67 following a stroke in the setting of diabetes; the patient's mother is living at 47. The patient has 3 brothers and 2 sisters. [name], [name], was diagnosed with breast cancer in her early 30s. There is no other breast or ovarian cancer in the family. The patient herself was diagnosed with watts by history he is an incidentally found stage I colon carcinoma noted at the time of her hysterectomy. She had a partial colectomy but no adjuvant treatment. We have no records regarding that procedure  GYNECOLOGIC HISTORY:  No LMP recorded. Patient has had a hysterectomy. Menarche age 32, first live birth age 63, the patient underwent total abdominal hysterectomy with bilateral salpingo-oophorectomy in her 20s. She did not take hormone replacement. She did not take oral  contraceptives at any point.  SOCIAL HISTORY:  [name] work for [employer] for about 40 years, retiring in 2008. She is divorced and lives alone, with no pets. Her daughter [name] works for [employer] in Press [employer]. The second daughter, [name], works as a Research scientist (physical sciences) for [employer]. The patient has 6  grandchildren and one great-grandchild. She attends a [employer].    ADVANCED DIRECTIVES: Not in place. The patient intends to name her daughter [name]. her healthcare power of attorney. [name] is work number is [id number]. On the patient's 03/01/2014 visit she was given a copy of the appropriate documents to complete and notarize at her discretion   HEALTH MAINTENANCE: History  Substance Use Topics  . Smoking status: Never Smoker   . Smokeless tobacco: Not on file  . Alcohol Use: No     Colonoscopy: 2000  PAP:  Bone density: 05/23/2004, at [facility] hospital; normal  Lipid panel:  No Known Allergies  Current Outpatient Prescriptions  Medication Sig Dispense Refill  . acetaminophen (TYLENOL) 500 MG tablet Take 1,000 mg by mouth every 6 (six) hours as needed for mild pain or moderate pain.      . ciprofloxacin (CIPRO) 500 MG tablet Take 1 tablet (500 mg total) by mouth 2 (two) times daily. As directed  35 tablet  0  . dexamethasone (DECADRON) 4 MG tablet Take 2 tablets by mouth once a day on the day after chemotherapy and then take 2 tablets two times a day for 2 days. Take with food.  30 tablet  1  . lidocaine-prilocaine (EMLA) cream Apply 1 application topically once. Apply to port 1-2 hours prior to access.  30 g  1  . LORazepam (ATIVAN) 0.5 MG tablet Take 1 tablet (0.5 mg total) by mouth every 6 (six) hours as needed (Nausea or vomiting).  30 tablet  0  . ondansetron (ZOFRAN) 8 MG tablet Take 1 tablet (8 mg total) by mouth 2 (two) times daily as needed. Start on the third day after chemotherapy.  30 tablet  1  . prochlorperazine (COMPAZINE) 10 MG tablet Take 1 tablet (10 mg total) by mouth every 6 (six) hours as needed (Nausea or vomiting).  30 tablet  1   No current facility-administered medications for this visit.    OBJECTIVE: Middle-aged [place] American woman who appears stated age 63 Vitals:   03/27/14 0955  BP: 129/88  Pulse: 89  Temp: 98.2 F (36.8 C)  Resp: 18      Body mass index is 46.24 kg/(m^2).    ECOG FS:0 - Asymptomatic  Ocular: Sclerae unicteric, EOMs intact Ear-nose-throat: Oropharynx clear and moist Lymphatic: No cervical or supraclavicular adenopathy Lungs no rales or rhonchi Heart regular rate and rhythm Abd soft, obese, nontender, positive bowel sounds MSK no focal spinal tenderness Neuro: non-focal, well-oriented, positive affect Breasts: Deferred Skin: Some hyperpigmentation already noted over the hands  LAB RESULTS:  CMP     Component Value Date/Time   NA 140 03/20/2014 0852   NA 142 04/13/2010 0403   K 3.9 03/20/2014 0852   K 3.4* 04/13/2010 0403   CL 107 04/13/2010 0403   CO2 25 03/20/2014 0852   CO2 29 04/13/2010 0403   GLUCOSE 127 03/20/2014 0852   GLUCOSE 110* 04/13/2010 0403   BUN 17.8 03/20/2014 0852   BUN 5* 04/13/2010 0403   CREATININE 0.9 03/20/2014 0852   CREATININE 0.81 04/13/2010 0403   CALCIUM 10.0 03/20/2014 0852   CALCIUM 8.4 04/13/2010 0403   PROT  7.4 03/20/2014 0852   PROT 6.9 04/05/2008 1120   ALBUMIN 3.5 03/20/2014 0852   ALBUMIN 3.6 04/05/2008 1120   AST 19 03/20/2014 0852   AST 16 04/05/2008 1120   ALT 65* 03/20/2014 0852   ALT 14 04/05/2008 1120   ALKPHOS 140 03/20/2014 0852   ALKPHOS 103 04/05/2008 1120   BILITOT 1.26* 03/20/2014 0852   BILITOT 1.2 04/05/2008 1120   GFRNONAA >60 04/13/2010 0403   GFRAA  Value: >60        The eGFR has been calculated using the MDRD equation. This calculation has not been validated in all clinical situations. eGFR's persistently <60 mL/min signify possible Chronic Kidney Disease. 04/13/2010 0403    I No results found for this basename: SPEP,  UPEP,   kappa and lambda light chains    Lab Results  Component Value Date   WBC 5.5 03/27/2014   NEUTROABS 3.9 03/27/2014   HGB 12.1 03/27/2014   HCT 37.1 03/27/2014   MCV 88.8 03/27/2014   PLT 169 03/27/2014      Chemistry      Component Value Date/Time   NA 140 03/20/2014 0852   NA 142 04/13/2010 0403   K 3.9 03/20/2014 0852   K 3.4* 04/13/2010  0403   CL 107 04/13/2010 0403   CO2 25 03/20/2014 0852   CO2 29 04/13/2010 0403   BUN 17.8 03/20/2014 0852   BUN 5* 04/13/2010 0403   CREATININE 0.9 03/20/2014 0852   CREATININE 0.81 04/13/2010 0403      Component Value Date/Time   CALCIUM 10.0 03/20/2014 0852   CALCIUM 8.4 04/13/2010 0403   ALKPHOS 140 03/20/2014 0852   ALKPHOS 103 04/05/2008 1120   AST 19 03/20/2014 0852   AST 16 04/05/2008 1120   ALT 65* 03/20/2014 0852   ALT 14 04/05/2008 1120   BILITOT 1.26* 03/20/2014 0852   BILITOT 1.2 04/05/2008 1120       No results found for this basename: LABCA2    No components found with this basename: LABCA125    No results found for this basename: INR,  in the last 168 hours  Urinalysis    Component Value Date/Time   COLORURINE YELLOW 04/05/2008 1120   APPEARANCEUR CLEAR 04/05/2008 1120   LABSPEC 1.021 04/05/2008 1120   PHURINE 6.0 04/05/2008 1120   GLUCOSEU NEGATIVE 04/05/2008 1120   HGBUR NEGATIVE 04/05/2008 1120   BILIRUBINUR NEGATIVE 04/05/2008 1120   KETONESUR NEGATIVE 04/05/2008 1120   PROTEINUR NEGATIVE 04/05/2008 1120   UROBILINOGEN 0.2 04/05/2008 1120   NITRITE NEGATIVE 04/05/2008 1120   LEUKOCYTESUR NEGATIVE MICROSCOPIC NOT DONE ON URINES WITH NEGATIVE PROTEIN, BLOOD, LEUKOCYTES, NITRITE, OR GLUCOSE <1000 mg/dL. 04/05/2008 1120    STUDIES: Ct Chest W Contrast  03/06/2014   CLINICAL DATA:  New diagnosis of left-sided breast cancer.  EXAM: CT CHEST WITH CONTRAST  TECHNIQUE: Multidetector CT imaging of the chest was performed during intravenous contrast administration.  CONTRAST:  63m OMNIPAQUE IOHEXOL 300 MG/ML  SOLN  COMPARISON:  Today's PET, dictated separately. Breast MRI of 03/01/2014. Chest CT 04/12/2010  FINDINGS: Lungs/Pleura: 4 mm right upper lobe lung nodule on image 16 is similar to image 28 of the prior chest CT, consistent with a benign etiology.  Right lower lobe 4 mm nodule on image 47/ series 5. Not readily apparent on the prior chest CT.  Favor dependent scarring in the right  lower lobe more superiorly on image 31.  No pleural fluid.  Heart/Mediastinum: No supraclavicular adenopathy. No right axillary  adenopathy. Left axillary adenopathy. Index node measures 2.4 x 2.3 cm on image 11.  Left paracentral deep lateral breast mass measures 3.2 x 2.9 cm on image 14.  Bilateral low-density thyroid nodules. The dominant 2.9 cm lesion on image 7 corresponds to mild hypermetabolism on PET. Grossly similar to on the 04/12/2010 exam.  Mild cardiomegaly, without pericardial effusion. No central pulmonary embolism, on this non-dedicated study. No mediastinal or hilar adenopathy. A small hiatal hernia. Fluid level in the thoracic esophagus on image 18.  Upper Abdomen: Normal imaged portions of the liver, spleen, distal stomach, pancreas, adrenal glands, kidneys, gallbladder, biliary tract. [place] within the splenic flexure the colon.  Bones/Musculoskeletal:  Right shoulder arthroplasty.  IMPRESSION: 1. Left breast primary with left axillary nodal metastasis. 2. Right-sided lung nodules. A lower lobe nodule is not readily apparent on the prior exam and warrants followup attention. 3. Bilateral thyroid nodules with a dominant right-sided lesion which is grossly similar in size back to 2011, but corresponds with mild hypermetabolism at PET. Consider ultrasound. 4. Small hiatal hernia. Esophageal air fluid level suggests dysmotility or gastroesophageal reflux.   Electronically Signed   By: Abigail Miyamoto M.D.   On: 03/06/2014 12:48   Mr Breast Bilateral W Wo Contrast  03/01/2014   CLINICAL DATA:  Recently diagnosed left breast invasive ductal carcinoma and metastatic left axillary lymph node.  LABS:  BUN and creatinine were obtained on site at [place] at  [address]  Results:  BUN 9 mg/dL,  Creatinine 1.0 mg/dL.  EXAM: BILATERAL BREAST MRI WITH AND WITHOUT CONTRAST  TECHNIQUE: Multiplanar, multisequence MR images of both breasts were obtained prior to and following the  intravenous administration of 29m of MultiHance.  THREE-DIMENSIONAL MR IMAGE RENDERING ON INDEPENDENT WORKSTATION:  Three-dimensional MR images were rendered by post-processing of the original MR data on an independent workstation. The three-dimensional MR images were interpreted, and findings are reported in the following complete MRI report for this study. Three dimensional images were evaluated at the independent DynaCad workstation  COMPARISON:  Recent mammogram, ultrasound and biopsy examinations.  FINDINGS: Breast composition: b.  Scattered fibroglandular tissue.  Background parenchymal enhancement: Minimal  Right breast: No mass or abnormal enhancement.  Left breast: 3.8 x 3.6 x 2.9 cm rounded enhancing mass with areas of nonenhancement compatible with necrosis in the 12 o'clock position of the left breast, containing a biopsy marker clip artifact. There is also enhancement surrounding the biopsy tract extending lateral to the mass. The tract and mass combined measure 5.3 cm in maximum diameter. The mass and enhancement surrounding the biopsy tract have persistent and plateau enhancement kinetics. No additional masses or areas of abnormal enhancement suspicious for malignancy in the left breast.  Lymph nodes: Multiple abnormally enlarged, rounded left axillary lymph nodes with loss of the normal fatty hila. These include level 1 and retropectoral lymph nodes.  Ancillary findings:  None.  IMPRESSION: 1. 3.8 x 3.6 x 2.9 cm biopsy-proven invasive ductal carcinoma in the 12 o'clock position of the left breast with adjacent post biopsy changes extending laterally, with a combined maximum diameter of 5.3 cm. 2. Multiple metastatic Level 1 and Level 2 left axillary lymph nodes. 3. No evidence of malignancy on the right.  RECOMMENDATION: Treatment plan.  BI-RADS CATEGORY  6: Known biopsy-proven malignancy.   Electronically Signed   By: SEnrique SackM.D.   On: 03/01/2014 09:56   Nm Pet Image Initial (pi) Skull Base  To Thigh  03/06/2014   CLINICAL  DATA:  Initial treatment strategy for left-sided breast cancer. Positive lymph nodes.  EXAM: NUCLEAR MEDICINE PET SKULL BASE TO THIGH  TECHNIQUE: 15.2 mCi F-18 FDG was injected intravenously. Full-ring PET imaging was performed from the skull base to thigh after the radiotracer. CT data was obtained and used for attenuation correction and anatomic localization.  FASTING BLOOD GLUCOSE:  Value: 86 mg/dl  COMPARISON:  Breast MR a of 03/01/2014.  FINDINGS: NECK  Mild hypermetabolism corresponding to enlargement and heterogeneity of the right lobe of the thyroid. This measures a S.U.V. max of 5.3 on image 30/series 4. Ill-defined hypoattenuation in this area measures 3.1 x 3.1 cm.  CHEST  Deep left paracentral breast mass which measures 3.1 cm and a S.U.V. max of 15.8 on image 43/series 4.  Left axillary nodal metastasis. An index node measures 2.5 x 2.4 cm and a S.U.V. max of 12.4 on image 42.  ABDOMEN/PELVIS  Hypermetabolism corresponding with underdistention involving the sigmoid colon and rectum. This measures a S.U.V. max of 15.3, including on image 148. There are surgical changes within the surrounding mesocolon, with suggestion of prominent perirectal vessels.  SKELETON  Likely degenerative hypermetabolism about the sternal manubrial joint.  CT IMAGES PERFORMED FOR ATTENUATION CORRECTION  Mild degradation secondary to patient body habitus. No significant findings within the neck. Chest findings deferred to today's chest CT, not yet performed. Small hiatal hernia. Mild cardiomegaly.  Left paracentral pelvic anterior wall hernia containing nonobstructive small bowel. Hysterectomy. 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 Left glenohumeral joint osteoarthritis with prior right glenohumeral joint arthroplasty.  IMPRESSION: 1. Left breast primary with left axillary nodal metastasis. 2. No evidence of extrathoracic hypermetabolic metastatic  disease. 3. Please see chest CT, scheduled for later today. 4. Right thyroid enlargement and heterogeneity with dominant right-sided mass. Consider thyroid ultrasound for further evaluation. 5. Hypermetabolism within the sigmoid and rectum which could be physiologic or due to colitis. Questionable surrounding hyperemia and surgical changes in this area. 6. Incidental findings, including small bowel containing left pelvic wall hernia.   Electronically Signed   By: Abigail Miyamoto M.D.   On: 03/06/2014 11:56   Ir Fluoro Guide Cv Line Right  03/08/2014   CLINICAL DATA:  Breast cancer  EXAM: TUNNEL POWER PORT PLACEMENT WITH SUBCUTANEOUS POCKET UTILIZING ULTRASOUND & FLOUROSCOPY  FLUOROSCOPY TIME:  36 seconds.  MEDICATIONS AND MEDICAL HISTORY: Versed two mg, Fentanyl 50 mcg.  As antibiotic prophylaxis, Ancef was ordered pre-procedure and administered intravenously within one hour of incision.  ANESTHESIA/SEDATION: Moderate sedation time: 30 minutes  CONTRAST:  None  PROCEDURE: After written informed consent was obtained, patient was placed in the supine position on angiographic table. The right neck and chest was prepped and draped in a sterile fashion. Lidocaine was utilized for local anesthesia. The right internal jugular vein was noted to be patent initially with ultrasound. Under sonographic guidance, a micropuncture needle was inserted into the right IJ vein (Ultrasound and fluoroscopic image documentation was performed). The needle was removed over an 018 wire which was exchanged for a Amplatz. This was advanced into the IVC. An 8-French dilator was advanced over the Amplatz.  A small incision was made in the right upper chest over the anterior right second rib. Utilizing blunt dissection, a subcutaneous pocket was created in the caudal direction. The pocket was irrigated with a copious amount of sterile normal saline. The port catheter was tunneled from the chest incision, and out the neck incision. The reservoir  was inserted  into the subcutaneous pocket and secured with two 3-0 Ethilon stitches. A peel-away sheath was advanced over the Amplatz wire. The port catheter was cut to measure length and inserted through the peel-away sheath. The peel-away sheath was removed. The chest incision was closed with 3-0 Vicryl interrupted stitches for the subcutaneous tissue and a running of 4-0 Vicryl subcuticular stitch for the skin. The neck incision was closed with a 4-0 Vicryl subcuticular stitch. Derma-bond was applied to both surgical incisions. The port reservoir was flushed and instilled with heparinized saline. No complications.  FINDINGS: A right IJ vein Port-A-Cath is in place with its tip at the cavoatrial junction.  IMPRESSION: Successful 8 French right internal jugular vein power port placement with its tip at the SVC/RA junction.   Electronically Signed   By: Maryclare Bean M.D.   On: 03/08/2014 13:58   Ir US Guide Vasc Access Right  03/08/2014   CLINICAL DATA:  Breast cancer  EXAM: TUNNEL POWER PORT PLACEMENT WITH SUBCUTANEOUS POCKET UTILIZING ULTRASOUND & FLOUROSCOPY  FLUOROSCOPY TIME:  36 seconds.  MEDICATIONS AND MEDICAL HISTORY: Versed two mg, Fentanyl 50 mcg.  As antibiotic prophylaxis, Ancef was ordered pre-procedure and administered intravenously within one hour of incision.  ANESTHESIA/SEDATION: Moderate sedation time: 30 minutes  CONTRAST:  None  PROCEDURE: After written informed consent was obtained, patient was placed in the supine position on angiographic table. The right neck and chest was prepped and draped in a sterile fashion. Lidocaine was utilized for local anesthesia. The right internal jugular vein was noted to be patent initially with ultrasound. Under sonographic guidance, a micropuncture needle was inserted into the right IJ vein (Ultrasound and fluoroscopic image documentation was performed). The needle was removed over an 018 wire which was exchanged for a Amplatz. This was advanced into the IVC.  An 8-French dilator was advanced over the Amplatz.  A small incision was made in the right upper chest over the anterior right second rib. Utilizing blunt dissection, a subcutaneous pocket was created in the caudal direction. The pocket was irrigated with a copious amount of sterile normal saline. The port catheter was tunneled from the chest incision, and out the neck incision. The reservoir was inserted into the subcutaneous pocket and secured with two 3-0 Ethilon stitches. A peel-away sheath was advanced over the Amplatz wire. The port catheter was cut to measure length and inserted through the peel-away sheath. The peel-away sheath was removed. The chest incision was closed with 3-0 Vicryl interrupted stitches for the subcutaneous tissue and a running of 4-0 Vicryl subcuticular stitch for the skin. The neck incision was closed with a 4-0 Vicryl subcuticular stitch. Derma-bond was applied to both surgical incisions. The port reservoir was flushed and instilled with heparinized saline. No complications.  FINDINGS: A right IJ vein Port-A-Cath is in place with its tip at the cavoatrial junction.  IMPRESSION: Successful 8 French right internal jugular vein power port placement with its tip at the SVC/RA junction.   Electronically Signed   By: Maryclare Bean M.D.   On: 03/08/2014 13:58   Mm Digital Diagnostic Bilat  02/20/2014   CLINICAL DATA:  63 year old female with a left breast palpable abnormality. The patient had a previously seen probably benign asymmetry in the left breast which she failed to follow-up.  EXAM: DIGITAL DIAGNOSTIC  LEFT MAMMOGRAM WITH CAD  ULTRASOUND LEFT BREAST  COMPARISON:  Previous exams.  ACR Breast Density Category b: There are scattered areas of fibroglandular density.  FINDINGS: There is a high  density mass in the outer left breast with associated distortion measuring up to 3.7 cm and confirmed on the additional spot compression tangential view. This corresponds to the site of palpable  concern in the left breast. In addition, there are morphologically abnormal lymph nodes in the left axilla.  Mammographic images were processed with CAD.  Physical examination at site of palpable concern in the left breast reveals a firm mass at the approximate 12 to 1 o'clock position.  Targeted ultrasound of the left breast was performed demonstrating an irregular hypoechoic mass at 1 o'clock 2 cm from the nipple measuring 2.6 x 2.5 x 2.6 cm. This corresponds with mammography findings.  Enlarged and morphologically abnormal lymph nodes are seen in the left axilla, the largest of which measures 3 x 2.1 x 2 cm.  IMPRESSION: Highly suspicious left breast mass and left axillary lymphadenopathy.  RECOMMENDATION: Ultrasound-guided biopsy of the mass in the left breast as well as the enlarged left axillary lymph node is recommended. This is scheduled for Wednesday June 10th at 11:30 a.m.  I have discussed the findings and recommendations with the patient. Results were also provided in writing at the conclusion of the visit. If applicable, a reminder letter will be sent to the patient regarding the next appointment.  BI-RADS CATEGORY  5: Highly suggestive of malignancy.   Electronically Signed   By: Everlean Alstrom M.D.   On: 02/20/2014 13:42   Mm Digital Diagnostic Unilat L  02/22/2014   CLINICAL DATA:  Post biopsy of a highly suspicious left breast mass and left axillary lymph node.  EXAM: DIAGNOSTIC LEFT MAMMOGRAM POST ULTRASOUND BIOPSY  COMPARISON:  Previous exams  FINDINGS: Mammographic images were obtained following ultrasound guided biopsy of a highly suspicious mass in the upper left breast as well as of a highly suspicious left axillary lymph node. A ribbon shaped biopsy marking clip is present in the targeted region of the mass.  IMPRESSION: Appropriate positioning of ribbon shaped biopsy marking clip in the left breast post biopsy of a highly suspicious mass at 12 o'clock.  Final Assessment: Post Procedure  Mammograms for Marker Placement   Electronically Signed   By: Everlean Alstrom M.D.   On: 02/22/2014 12:33   US Breast Ltd Uni Left Inc Axilla  02/20/2014   CLINICAL DATA:  63 year old female with a left breast palpable abnormality. The patient had a previously seen probably benign asymmetry in the left breast which she failed to follow-up.  EXAM: DIGITAL DIAGNOSTIC  LEFT MAMMOGRAM WITH CAD  ULTRASOUND LEFT BREAST  COMPARISON:  Previous exams.  ACR Breast Density Category b: There are scattered areas of fibroglandular density.  FINDINGS: There is a high density mass in the outer left breast with associated distortion measuring up to 3.7 cm and confirmed on the additional spot compression tangential view. This corresponds to the site of palpable concern in the left breast. In addition, there are morphologically abnormal lymph nodes in the left axilla.  Mammographic images were processed with CAD.  Physical examination at site of palpable concern in the left breast reveals a firm mass at the approximate 12 to 1 o'clock position.  Targeted ultrasound of the left breast was performed demonstrating an irregular hypoechoic mass at 1 o'clock 2 cm from the nipple measuring 2.6 x 2.5 x 2.6 cm. This corresponds with mammography findings.  Enlarged and morphologically abnormal lymph nodes are seen in the left axilla, the largest of which measures 3 x 2.1 x 2 cm.  IMPRESSION: Highly  suspicious left breast mass and left axillary lymphadenopathy.  RECOMMENDATION: Ultrasound-guided biopsy of the mass in the left breast as well as the enlarged left axillary lymph node is recommended. This is scheduled for Wednesday June 10th at 11:30 a.m.  I have discussed the findings and recommendations with the patient. Results were also provided in writing at the conclusion of the visit. If applicable, a reminder letter will be sent to the patient regarding the next appointment.  BI-RADS CATEGORY  5: Highly suggestive of malignancy.    Electronically Signed   By: Everlean Alstrom M.D.   On: 02/20/2014 13:42   [place] Lt Breast Bx W Loc Dev 1st Lesion Img Bx Spec US Guide  02/23/2014   ADDENDUM REPORT: 02/23/2014 16:25  ADDENDUM: THE PATHOLOGY ASSOCIATED WITH THE LEFT BREAST ULTRASOUND-GUIDED CORE BIOPSY DEMONSTRATED GRADE 3 INVASIVE DUCTAL CARCINOMA. THE PATHOLOGY ASSOCIATED WITH THE LEFT AXILLARY LYMPH NODE ULTRASOUND-GUIDED CORE BIOPSY DEMONSTRATED METASTATIC CARCINOMA WITHIN THE LYMPH NODE. THE PATHOLOGY IS CONCORDANT WITH THE IMAGING FINDINGS. I HAVE DISCUSSED FINDINGS WITH THE PATIENT BY TELEPHONE AND ANSWERED HER QUESTIONS. PATIENT STATES HER BIOPSY SITES ARE CLEAN AND DRY WITHOUT HEMATOMA FORMATION OR SIGNS OF INFECTION. THERE IS MILD TENDERNESS. THE PATIENT IS SCHEDULED FOR THE BREAST CANCER [place] ON 06/17 2015. THE PATIENT IS ALSO SCHEDULED FOR BREAST MRI ON 03/01/2014 AT 7:15 A.M. POST BIOPSY WOUND CARE INSTRUCTIONS WERE REVIEWED WITH THE PATIENT. THE PATIENT WAS ENCOURAGED TO CALL OUR BREAST CENTER FOR ADDITIONAL QUESTIONS OR CONCERNS.   Electronically Signed   By: Luberta Robertson M.D.   On: 02/23/2014 16:25   02/23/2014   CLINICAL DATA:  Highly suspicious left breast mass and left axillary lymph node.  EXAM: ULTRASOUND GUIDED LEFT BREAST CORE NEEDLE BIOPSY  COMPARISON:  Previous exams.  PROCEDURE: I met with the patient and we discussed the procedure of ultrasound-guided biopsy, including benefits and alternatives. We discussed the high likelihood of a successful procedure. We discussed the risks of the procedure including infection, bleeding, tissue injury, clip migration, and inadequate sampling. Informed written consent was given. The usual time-out protocol was performed immediately prior to the procedure.  1. Using sterile technique and 2% Lidocaine as local anesthetic, under direct ultrasound visualization, a 12 gauge vacuum-assisteddevice was used to perform biopsy of highly suspicious mass in the left breast  at 12 o'clock using a lateral approach. At the conclusion of the procedure, a ribbon shaped tissue marker clip was deployed into the biopsy cavity. Follow-up 2-view mammogram was performed and dictated separately.  2. Using sterile technique and 2% Lidocaine as local anesthetic, under direct ultrasound visualization, a 12 gauge vacuum-assisteddevice was used to perform biopsy of the enlarged and morphologically abnormal lymph node in the low left axilla using a lateral approach.  IMPRESSION: Ultrasound-guided biopsy of a highly suspicious mass in the left breast as well as of a highly suspicious left axillary lymph node. No apparent complications.  Electronically Signed: By: Everlean Alstrom M.D. On: 02/22/2014 12:09   [place] Lt Breast Bx W Loc Dev Ea Add Lesion Img Bx Spec US Guide  lly Signed: By: Everlean Alstrom M.D. On: 02/22/2014 12:09   ASSESSMENT: 63 y.o. [name] woman s/p breast and left axillary lymph node biopsy 02/22/2014, both positive for a clinical T2 N2, stage IIIA invasive ductal carcinoma, grade 3, triple negative, with an MIB-1 of 90%  (1) neoadjuvant chemotherapy started 03/13/2014, with cyclophosphamide and doxorubicin in dose dense fashion x4, with Neulasta support on day 2, to be followed  by weekly carboplatin and paclitaxel x12  (2) definitive surgery to follow chemotherapy  (3) radiation to follow surgery  (4) remote history of partial colectomy for early stage colon cancer incidentally found during [facility]/BS[facility] in the 1980s  (5) genetics testing pending  (6) staging studies showed right-sided lung nodules, too small to characterize, and a dominant right-sided thyroid nodule unchanged in size as compared to 2011  PLAN: Aleila tolerated her first dose of AC well. CBC reviewed with her today. Recommend that she proceed with cycle 2 of dose dense AC today.   She will return next week for genetic counseling, to check CBC, and to assess for chemotoxicities.   The patient has a  good understanding of the overall plan. She agrees with it. She knows the goal of treatment in her case is cure. She will call with any problems that may develop before her next visit here.  Mikey Bussing, NP   03/27/2014 10:58 AM

## 2014-03-27 NOTE — Patient Instructions (Signed)
[facility] Discharge Instructions for Patients Receiving Chemotherapy  Today you received the following chemotherapy agents adriamycin and cytoxan  To help prevent nausea and vomiting after your treatment, we encourage you to take your nausea medication as needed.   If you develop nausea and vomiting that is not controlled by your nausea medication, call the clinic.   BELOW ARE SYMPTOMS THAT SHOULD BE REPORTED IMMEDIATELY:  *FEVER GREATER THAN 100.5 F  *CHILLS WITH OR WITHOUT FEVER  NAUSEA AND VOMITING THAT IS NOT CONTROLLED WITH YOUR NAUSEA MEDICATION  *UNUSUAL SHORTNESS OF BREATH  *UNUSUAL BRUISING OR BLEEDING  TENDERNESS IN MOUTH AND THROAT WITH OR WITHOUT PRESENCE OF ULCERS  *URINARY PROBLEMS  *BOWEL PROBLEMS  UNUSUAL RASH Items with * indicate a potential emergency and should be followed up as soon as possible.  Feel free to call the clinic you have any questions or concerns. The clinic phone number is [phone].

## 2014-03-28 ENCOUNTER — Ambulatory Visit: Payer: Medicare Other | Admitting: Physical Therapy

## 2014-03-28 ENCOUNTER — Telehealth: Payer: Self-pay | Admitting: Oncology

## 2014-03-28 ENCOUNTER — Ambulatory Visit (HOSPITAL_BASED_OUTPATIENT_CLINIC_OR_DEPARTMENT_OTHER): Payer: Medicare Other

## 2014-03-28 VITALS — BP 122/90 | HR 98 | Temp 98.4°F

## 2014-03-28 DIAGNOSIS — C50919 Malignant neoplasm of unspecified site of unspecified female breast: Secondary | ICD-10-CM | POA: Diagnosis not present

## 2014-03-28 DIAGNOSIS — Z5189 Encounter for other specified aftercare: Secondary | ICD-10-CM | POA: Diagnosis not present

## 2014-03-28 DIAGNOSIS — C773 Secondary and unspecified malignant neoplasm of axilla and upper limb lymph nodes: Secondary | ICD-10-CM

## 2014-03-28 DIAGNOSIS — C50412 Malignant neoplasm of upper-outer quadrant of left female breast: Secondary | ICD-10-CM

## 2014-03-28 MED ORDER — PEGFILGRASTIM INJECTION 6 MG/0.6ML
6.0000 mg | Freq: Once | SUBCUTANEOUS | Status: AC
  Administered 2014-03-28: 6 mg via SUBCUTANEOUS
  Filled 2014-03-28: qty 0.6

## 2014-03-28 NOTE — Telephone Encounter (Signed)
added flush appts to all lab appts per 2nd 7/13 pof. lmonvm for re change and confirmed next appt for 7/20. pt is also mychart active and asked to get new schedule 7/20.

## 2014-03-29 ENCOUNTER — Ambulatory Visit: Payer: Medicare Other | Admitting: Physical Therapy

## 2014-03-29 DIAGNOSIS — IMO0001 Reserved for inherently not codable concepts without codable children: Secondary | ICD-10-CM | POA: Diagnosis not present

## 2014-03-31 ENCOUNTER — Ambulatory Visit: Payer: Medicare Other | Admitting: Physical Therapy

## 2014-03-31 DIAGNOSIS — IMO0001 Reserved for inherently not codable concepts without codable children: Secondary | ICD-10-CM | POA: Diagnosis not present

## 2014-04-03 ENCOUNTER — Ambulatory Visit: Payer: Medicare Other

## 2014-04-03 ENCOUNTER — Ambulatory Visit (HOSPITAL_BASED_OUTPATIENT_CLINIC_OR_DEPARTMENT_OTHER): Payer: Medicare Other

## 2014-04-03 ENCOUNTER — Ambulatory Visit (HOSPITAL_BASED_OUTPATIENT_CLINIC_OR_DEPARTMENT_OTHER): Payer: Medicare Other | Admitting: Oncology

## 2014-04-03 ENCOUNTER — Encounter: Payer: Self-pay | Admitting: Oncology

## 2014-04-03 ENCOUNTER — Other Ambulatory Visit (HOSPITAL_BASED_OUTPATIENT_CLINIC_OR_DEPARTMENT_OTHER): Payer: Medicare Other

## 2014-04-03 ENCOUNTER — Ambulatory Visit (HOSPITAL_BASED_OUTPATIENT_CLINIC_OR_DEPARTMENT_OTHER): Payer: Self-pay | Admitting: Genetic Counselor

## 2014-04-03 VITALS — BP 122/90 | HR 104 | Temp 99.1°F | Resp 18 | Ht 61.0 in | Wt 239.4 lb

## 2014-04-03 DIAGNOSIS — Z95828 Presence of other vascular implants and grafts: Secondary | ICD-10-CM

## 2014-04-03 DIAGNOSIS — IMO0002 Reserved for concepts with insufficient information to code with codable children: Secondary | ICD-10-CM

## 2014-04-03 DIAGNOSIS — C50919 Malignant neoplasm of unspecified site of unspecified female breast: Secondary | ICD-10-CM | POA: Diagnosis not present

## 2014-04-03 DIAGNOSIS — C189 Malignant neoplasm of colon, unspecified: Secondary | ICD-10-CM

## 2014-04-03 DIAGNOSIS — C50412 Malignant neoplasm of upper-outer quadrant of left female breast: Secondary | ICD-10-CM

## 2014-04-03 DIAGNOSIS — C50419 Malignant neoplasm of upper-outer quadrant of unspecified female breast: Secondary | ICD-10-CM

## 2014-04-03 DIAGNOSIS — Z803 Family history of malignant neoplasm of breast: Secondary | ICD-10-CM | POA: Insufficient documentation

## 2014-04-03 LAB — CBC WITH DIFFERENTIAL/PLATELET
BASO%: 4.7 % — ABNORMAL HIGH (ref 0.0–2.0)
BASOS ABS: 0 10*3/uL (ref 0.0–0.1)
EOS%: 0.9 % (ref 0.0–7.0)
Eosinophils Absolute: 0 10*3/uL (ref 0.0–0.5)
HCT: 37.3 % (ref 34.8–46.6)
HGB: 12 g/dL (ref 11.6–15.9)
LYMPH#: 0.4 10*3/uL — AB (ref 0.9–3.3)
LYMPH%: 52 % — ABNORMAL HIGH (ref 14.0–49.7)
MCH: 28.2 pg (ref 25.1–34.0)
MCHC: 32.1 g/dL (ref 31.5–36.0)
MCV: 87.8 fL (ref 79.5–101.0)
MONO#: 0.1 10*3/uL (ref 0.1–0.9)
MONO%: 18.9 % — ABNORMAL HIGH (ref 0.0–14.0)
NEUT#: 0.2 10*3/uL — CL (ref 1.5–6.5)
NEUT%: 23.5 % — ABNORMAL LOW (ref 38.4–76.8)
Platelets: 203 10*3/uL (ref 145–400)
RBC: 4.25 10*6/uL (ref 3.70–5.45)
RDW: 13.8 % (ref 11.2–14.5)
WBC: 0.7 10*3/uL — AB (ref 3.9–10.3)

## 2014-04-03 LAB — COMPREHENSIVE METABOLIC PANEL (CC13)
ALBUMIN: 3.3 g/dL — AB (ref 3.5–5.0)
ALT: 51 U/L (ref 0–55)
AST: 18 U/L (ref 5–34)
Alkaline Phosphatase: 155 U/L — ABNORMAL HIGH (ref 40–150)
Anion Gap: 9 mEq/L (ref 3–11)
BUN: 13.3 mg/dL (ref 7.0–26.0)
CALCIUM: 9.7 mg/dL (ref 8.4–10.4)
CHLORIDE: 109 meq/L (ref 98–109)
CO2: 23 mEq/L (ref 22–29)
Creatinine: 0.9 mg/dL (ref 0.6–1.1)
Glucose: 96 mg/dl (ref 70–140)
POTASSIUM: 4.1 meq/L (ref 3.5–5.1)
Sodium: 141 mEq/L (ref 136–145)
Total Bilirubin: 0.65 mg/dL (ref 0.20–1.20)
Total Protein: 7.1 g/dL (ref 6.4–8.3)

## 2014-04-03 MED ORDER — HEPARIN SOD (PORK) LOCK FLUSH 100 UNIT/ML IV SOLN
500.0000 [IU] | Freq: Once | INTRAVENOUS | Status: AC
  Administered 2014-04-03: 500 [IU] via INTRAVENOUS
  Filled 2014-04-03: qty 5

## 2014-04-03 MED ORDER — SODIUM CHLORIDE 0.9 % IJ SOLN
10.0000 mL | INTRAMUSCULAR | Status: DC | PRN
  Administered 2014-04-03: 10 mL via INTRAVENOUS
  Filled 2014-04-03: qty 10

## 2014-04-03 MED ORDER — CIPROFLOXACIN HCL 500 MG PO TABS: 500.0000 mg | ORAL_TABLET | Freq: Two times a day (BID) | ORAL | Status: DC

## 2014-04-03 NOTE — Progress Notes (Signed)
HISTORY OF PRESENT ILLNESS: Dr. Baird Cancer requested a cancer genetics consultation for [name], a 63 y.o. female, due to a personal and family history of cancer.  [name] presents to clinic today to discuss the possibility of a hereditary predisposition to cancer, genetic testing, and to further clarify her future cancer risks, as well as potential cancer risk for family members. 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 She also has a history of colorectal cancer in her late 28s and is s/p resection for this.    Past Medical History  Diagnosis Date   Colon cancer    Hx of rotator cuff surgery     Past Surgical History  Procedure Laterality Date   Abdominal hysterectomy     Colon surgery      colectomy/colostomy with takedown    History   Social History   Marital Status: Divorced    Spouse Name: N/A    Number of Children: N/A   Years of Education: N/A   Social History Main Topics   Smoking status: Never Smoker    Smokeless tobacco: Not on file   Alcohol Use: No   Drug Use: No   Sexual Activity: Not on file   Other Topics Concern   Not on file   Social History Narrative   No narrative on file     FAMILY HISTORY:  During the visit, a 4-generation pedigree was obtained. Significant diagnoses include the following:  Family History  Problem Relation Age of Onset   Cancer Mother 66    breast   Cancer Maternal Aunt     breast cancer at unknown age    [name]'s ancestry is of African American descent. There is no known Jewish ancestry or consanguinity.  GENETIC COUNSELING ASSESSMENT: [name] is a 63 y.o. female with a personal and family history of cancer suggestive of a hereditary predisposition to cancer. We, therefore, discussed and recommended the following at today's visit.   DISCUSSION: We reviewed the characteristics, features and inheritance patterns of hereditary cancer syndromes. We  also discussed genetic testing, including the appropriate family members to test, the process of testing, insurance coverage and turn-around-time for results. We discussed the implications of a negative, positive and/or variant of uncertain significant result. We recommended [name] pursue genetic testing for the [employer] gene panel.   PLAN: Based on our above recommendation, [name] wished to pursue genetic testing and the blood sample was drawn and will be sent to [facility] for analysis. Results should be available within approximately 5 weeks time, at which point they will be disclosed by telephone to [name], as will any additional recommendations warranted by these results. We also encouraged [name] to remain in contact with cancer genetics annually so that we can continuously update the family history and inform her of any changes in cancer genetics and testing that may be of benefit for this family. Ms.  Lopez questions were answered to her satisfaction today. Our contact information was provided should additional questions or concerns arise.   Thank you for the referral and allowing [place] to share in the care of your patient.   The patient was seen for a total of 45 minutes in face-to-face genetic counseling.  This patient was discussed with Magrinat who agrees with the above.    _______________________________________________________________________ For Office Staff:  Number of people involved in session: 3 Was an Intern/ student involved with case: not  applicable

## 2014-04-03 NOTE — Progress Notes (Signed)
Telephone:([phone] [phone]) [phone]     ID: [name] DOB: 08/12/1951  MR#: [id number]  FFM#:[id number]  PCP: Ruth Greenland, MD GYN: SU: [name] OTHER MD: [name]  CHIEF COMPLAINT: Stage III breast Lopez  CURRENT TREATMENT: Neoadjuvant chemotherapy   BREAST Lopez HISTORY: [name] herself palpated a mass in her left breast, and immediately brought it to the attention of her primary care physician, Dr. Baird Lopez, who confirmed the finding and setup the patient for bilateral diagnostic mammography at the [facility] Center 02/20/2014. This showed a high density mass in the outer left breast measuring up to 3.7 cm. It corresponded to the site of palpable concern. In addition the normal 4 logically abnormal lymph nodes in the left axilla. The mass was palpable by exam, and by ultrasonography measured 2.6 cm. There were enlarged and morphologically abnormal lymph nodes in the left axilla, largest measuring 3 cm.  Biopsy of the left breast mass in question and 1 of the abnormal axillary lymph nodes 02/22/2014 showed ([id number]) biopsies to be positive for an invasive ductal carcinoma, grade 3, triple negative, with an MIB-1 of 90%. Specifically the HER-2 signals ratio was 1.05, and the number per cell 2.00.  MRI of the breasts 03/01/2014 showed a 3.8 cm enhancing mass with areas of apparent necrosis in the left breast, as well as the biopsy tract extending laterally, the combined measure 5.3 cm. There were no other masses or areas of abnormal enhancement. There were multiple abnormally enlarged left axillary lymph nodes with loss of the normal fatty hilum. These included level I and retropectoral lymph nodes.  The patient's subsequent history is as detailed below  INTERVAL HISTORY: [name] returns today for followup of her breast Lopez accompanied by her daughter. Today is day 8 cycle 2 of 4 planned cycles of doxorubicin and cyclophosphamide  given every 2 weeks with Neulasta support, to be followed by weekly carboplatin and paclitaxel x12  REVIEW OF SYSTEMS: [name] feels well overall today. She reports mild fatigue, but this is not affecting her performance status. Reports dyspnea on exertion, but she is not short of breath at rest. Denies cough. She has had no fever, mouth sores, nausea, vomiting, dizziness, cough, or any change in bowel or bladder habits. A detailed review of systems today was otherwise noncontributory  PAST MEDICAL HISTORY: Past Medical History  Diagnosis Date  . Colon Lopez   . Hx of rotator cuff surgery     PAST SURGICAL HISTORY: Past Surgical History  Procedure Laterality Date  . Abdominal hysterectomy    . Colon surgery      colectomy/colostomy with takedown    FAMILY HISTORY Family History  Problem Relation Age of Onset  . Lopez Mother 5    breast  . Lopez Maternal Aunt     breast Lopez at unknown age   the patient's father died at the age of 74 following a stroke in the setting of diabetes; the patient's mother is living at 22. The patient has 3 brothers and 2 sisters. [name], [name], was diagnosed with breast Lopez in her early 35s. There is no other breast or ovarian Lopez in the family. The patient herself was diagnosed with watts by history he is an incidentally found stage I colon carcinoma noted at the time of her hysterectomy. She had a partial colectomy but no adjuvant treatment. We have no records regarding that procedure  GYNECOLOGIC HISTORY:  No LMP recorded. Patient has had a  hysterectomy. Menarche age 76, first live birth age 2, the patient underwent total abdominal hysterectomy with bilateral salpingo-oophorectomy in her 53s. She did not take hormone replacement. She did not take oral contraceptives at any point.  SOCIAL HISTORY:  [name] work for [employer] for about 40 years, retiring in 2008. She is divorced and lives alone, with no pets. Her daughter  [name] works for [employer] in [employer]. The second daughter, [name], works as a Research scientist (physical sciences) for [employer]. The patient has 6 grandchildren and one great-grandchild. She attends a [name].    ADVANCED DIRECTIVES: Not in place. The patient intends to name her daughter [name]. [name] is work number is [id number]. On the patient's 03/01/2014 visit she was given a copy of the appropriate documents to complete and notarize at her discretion   HEALTH MAINTENANCE: History  Substance Use Topics  . Smoking status: Never Smoker   . Smokeless tobacco: Not on file  . Alcohol Use: No     Colonoscopy: 2000  PAP:  Bone density: 05/23/2004, at [facility] hospital; normal  Lipid panel:  No Known Allergies  Current Outpatient Prescriptions  Medication Sig Dispense Refill  . acetaminophen (TYLENOL) 500 MG tablet Take 1,000 mg by mouth every 6 (six) hours as needed for mild pain or moderate pain.      Marland Kitchen dexamethasone (DECADRON) 4 MG tablet Take 2 tablets by mouth once a day on the day after chemotherapy and then take 2 tablets two times a day for 2 days. Take with food.  30 tablet  1  . lidocaine-prilocaine (EMLA) cream Apply 1 application topically once. Apply to port 1-2 hours prior to access.  30 g  1  . LORazepam (ATIVAN) 0.5 MG tablet Take 1 tablet (0.5 mg total) by mouth every 6 (six) hours as needed (Nausea or vomiting).  30 tablet  0  . ondansetron (ZOFRAN) 8 MG tablet Take 1 tablet (8 mg total) by mouth 2 (two) times daily as needed. Start on the third day after chemotherapy.  30 tablet  1  . prochlorperazine (COMPAZINE) 10 MG tablet Take 1 tablet (10 mg total) by mouth every 6 (six) hours as needed (Nausea or vomiting).  30 tablet  1  . ciprofloxacin (CIPRO) 500 MG tablet Take 1 tablet (500 mg total) by mouth 2 (two) times daily.  10 tablet  0   No current facility-administered medications for this visit.    OBJECTIVE: Middle-aged  [place] woman who appears stated age 20 Vitals:   04/03/14 1310  BP: 122/90  Pulse: 104  Temp: 99.1 F (37.3 C)  Resp: 18     Body mass index is 45.26 kg/(m^2).    ECOG FS:0 - Asymptomatic  Ocular: Sclerae unicteric, EOMs intact Ear-nose-throat: Oropharynx clear and moist Lymphatic: No cervical or supraclavicular adenopathy Lungs no rales or rhonchi Heart regular rate and rhythm Abd soft, obese, nontender, positive bowel sounds MSK no focal spinal tenderness Neuro: non-focal, well-oriented, positive affect Breasts: Deferred Skin: Some hyperpigmentation already noted over the hands  LAB RESULTS:  CMP     Component Value Date/Time   NA 141 04/03/2014 1158   NA 142 04/13/2010 0403   K 4.1 04/03/2014 1158   K 3.4* 04/13/2010 0403   CL 107 04/13/2010 0403   CO2 23 04/03/2014 1158   CO2 29 04/13/2010 0403   GLUCOSE 96 04/03/2014 1158   GLUCOSE 110* 04/13/2010 0403   BUN 13.3 04/03/2014 1158   BUN  5* 04/13/2010 0403   CREATININE 0.9 04/03/2014 1158   CREATININE 0.81 04/13/2010 0403   CALCIUM 9.7 04/03/2014 1158   CALCIUM 8.4 04/13/2010 0403   PROT 7.1 04/03/2014 1158   PROT 6.9 04/05/2008 1120   ALBUMIN 3.3* 04/03/2014 1158   ALBUMIN 3.6 04/05/2008 1120   AST 18 04/03/2014 1158   AST 16 04/05/2008 1120   ALT 51 04/03/2014 1158   ALT 14 04/05/2008 1120   ALKPHOS 155* 04/03/2014 1158   ALKPHOS 103 04/05/2008 1120   BILITOT 0.65 04/03/2014 1158   BILITOT 1.2 04/05/2008 1120   GFRNONAA >60 04/13/2010 0403   GFRAA  Value: >60        The eGFR has been calculated using the MDRD equation. This calculation has not been validated in all clinical situations. eGFR's persistently <60 mL/min signify possible Chronic Kidney Disease. 04/13/2010 0403    I No results found for this basename: SPEP,  UPEP,   kappa and lambda light chains    Lab Results  Component Value Date   WBC 0.7* 04/03/2014   NEUTROABS 0.2* 04/03/2014   HGB 12.0 04/03/2014   HCT 37.3 04/03/2014   MCV 87.8 04/03/2014   PLT 203  04/03/2014      Chemistry      Component Value Date/Time   NA 141 04/03/2014 1158   NA 142 04/13/2010 0403   K 4.1 04/03/2014 1158   K 3.4* 04/13/2010 0403   CL 107 04/13/2010 0403   CO2 23 04/03/2014 1158   CO2 29 04/13/2010 0403   BUN 13.3 04/03/2014 1158   BUN 5* 04/13/2010 0403   CREATININE 0.9 04/03/2014 1158   CREATININE 0.81 04/13/2010 0403      Component Value Date/Time   CALCIUM 9.7 04/03/2014 1158   CALCIUM 8.4 04/13/2010 0403   ALKPHOS 155* 04/03/2014 1158   ALKPHOS 103 04/05/2008 1120   AST 18 04/03/2014 1158   AST 16 04/05/2008 1120   ALT 51 04/03/2014 1158   ALT 14 04/05/2008 1120   BILITOT 0.65 04/03/2014 1158   BILITOT 1.2 04/05/2008 1120       No results found for this basename: LABCA2    No components found with this basename: OEVOJ500    No results found for this basename: INR,  in the last 168 hours  Urinalysis    Component Value Date/Time   COLORURINE YELLOW 04/05/2008 1120   APPEARANCEUR CLEAR 04/05/2008 1120   LABSPEC 1.021 04/05/2008 1120   PHURINE 6.0 04/05/2008 1120   GLUCOSEU NEGATIVE 04/05/2008 1120   HGBUR NEGATIVE 04/05/2008 1120   BILIRUBINUR NEGATIVE 04/05/2008 1120   KETONESUR NEGATIVE 04/05/2008 1120   PROTEINUR NEGATIVE 04/05/2008 1120   UROBILINOGEN 0.2 04/05/2008 1120   NITRITE NEGATIVE 04/05/2008 1120   LEUKOCYTESUR NEGATIVE MICROSCOPIC NOT DONE ON URINES WITH NEGATIVE PROTEIN, BLOOD, LEUKOCYTES, NITRITE, OR GLUCOSE <1000 mg/dL. 04/05/2008 1120    STUDIES: Ct Chest W Contrast  03/06/2014   CLINICAL DATA:  New diagnosis of left-sided breast Lopez.  EXAM: CT CHEST WITH CONTRAST  TECHNIQUE: Multidetector CT imaging of the chest was performed during intravenous contrast administration.  CONTRAST:  57m OMNIPAQUE IOHEXOL 300 MG/ML  SOLN  COMPARISON:  Today's PET, dictated separately. Breast MRI of 03/01/2014. Chest CT 04/12/2010  FINDINGS: Lungs/Pleura: 4 mm right upper lobe lung nodule on image 16 is similar to image 28 of the prior chest CT, consistent  with a benign etiology.  Right lower lobe 4 mm nodule on image 47/ series 5. Not readily apparent on  the prior chest CT.  Favor dependent scarring in the right lower lobe more superiorly on image 31.  No pleural fluid.  Heart/Mediastinum: No supraclavicular adenopathy. No right axillary adenopathy. Left axillary adenopathy. Index node measures 2.4 x 2.3 cm on image 11.  Left paracentral deep lateral breast mass measures 3.2 x 2.9 cm on image 14.  Bilateral low-density thyroid nodules. The dominant 2.9 cm lesion on image 7 corresponds to mild hypermetabolism on PET. Grossly similar to on the 04/12/2010 exam.  Mild cardiomegaly, without pericardial effusion. No central pulmonary embolism, on this non-dedicated study. No mediastinal or hilar adenopathy. A small hiatal hernia. Fluid level in the thoracic esophagus on image 18.  Upper Abdomen: Normal imaged portions of the liver, spleen, distal stomach, pancreas, adrenal glands, kidneys, gallbladder, biliary tract. [place] within the splenic flexure the colon.  Bones/Musculoskeletal:  Right shoulder arthroplasty.  IMPRESSION: 1. Left breast primary with left axillary nodal metastasis. 2. Right-sided lung nodules. A lower lobe nodule is not readily apparent on the prior exam and warrants followup attention. 3. Bilateral thyroid nodules with a dominant right-sided lesion which is grossly similar in size back to 2011, but corresponds with mild hypermetabolism at PET. Consider ultrasound. 4. Small hiatal hernia. Esophageal air fluid level suggests dysmotility or gastroesophageal reflux.   Electronically Signed   By: Abigail Miyamoto M.D.   On: 03/06/2014 12:48   Mr Breast Bilateral W Wo Contrast  03/01/2014   CLINICAL DATA:  Recently diagnosed left breast invasive ductal carcinoma and metastatic left axillary lymph node.  LABS:  BUN and creatinine were obtained on site at [facility] at  [address]  Results:  BUN 9 mg/dL,  Creatinine 1.0 mg/dL.  EXAM:  BILATERAL BREAST MRI WITH AND WITHOUT CONTRAST  TECHNIQUE: Multiplanar, multisequence MR images of both breasts were obtained prior to and following the intravenous administration of 51m of MultiHance.  THREE-DIMENSIONAL MR IMAGE RENDERING ON INDEPENDENT WORKSTATION:  Three-dimensional MR images were rendered by post-processing of the original MR data on an independent workstation. The three-dimensional MR images were interpreted, and findings are reported in the following complete MRI report for this study. Three dimensional images were evaluated at the independent DynaCad workstation  COMPARISON:  Recent mammogram, ultrasound and biopsy examinations.  FINDINGS: Breast composition: b.  Scattered fibroglandular tissue.  Background parenchymal enhancement: Minimal  Right breast: No mass or abnormal enhancement.  Left breast: 3.8 x 3.6 x 2.9 cm rounded enhancing mass with areas of nonenhancement compatible with necrosis in the 12 o'clock position of the left breast, containing a biopsy marker clip artifact. There is also enhancement surrounding the biopsy tract extending lateral to the mass. The tract and mass combined measure 5.3 cm in maximum diameter. The mass and enhancement surrounding the biopsy tract have persistent and plateau enhancement kinetics. No additional masses or areas of abnormal enhancement suspicious for malignancy in the left breast.  Lymph nodes: Multiple abnormally enlarged, rounded left axillary lymph nodes with loss of the normal fatty hila. These include level 1 and retropectoral lymph nodes.  Ancillary findings:  None.  IMPRESSION: 1. 3.8 x 3.6 x 2.9 cm biopsy-proven invasive ductal carcinoma in the 12 o'clock position of the left breast with adjacent post biopsy changes extending laterally, with a combined maximum diameter of 5.3 cm. 2. Multiple metastatic Level 1 and Level 2 left axillary lymph nodes. 3. No evidence of malignancy on the right.  RECOMMENDATION: Treatment plan.  BI-RADS  CATEGORY  6: Known biopsy-proven malignancy.  Electronically Signed   By: Enrique Sack M.D.   On: 03/01/2014 09:56   Nm Pet Image Initial (pi) Skull Base To Thigh  03/06/2014   CLINICAL DATA:  Initial treatment strategy for left-sided breast Lopez. Positive lymph nodes.  EXAM: NUCLEAR MEDICINE PET SKULL BASE TO THIGH  TECHNIQUE: 15.2 mCi F-18 FDG was injected intravenously. Full-ring PET imaging was performed from the skull base to thigh after the radiotracer. CT data was obtained and used for attenuation correction and anatomic localization.  FASTING BLOOD GLUCOSE:  Value: 86 mg/dl  COMPARISON:  Breast MR a of 03/01/2014.  FINDINGS: NECK  Mild hypermetabolism corresponding to enlargement and heterogeneity of the right lobe of the thyroid. This measures a S.U.V. max of 5.3 on image 30/series 4. Ill-defined hypoattenuation in this area measures 3.1 x 3.1 cm.  CHEST  Deep left paracentral breast mass which measures 3.1 cm and a S.U.V. max of 15.8 on image 43/series 4.  Left axillary nodal metastasis. An index node measures 2.5 x 2.4 cm and a S.U.V. max of 12.4 on image 42.  ABDOMEN/PELVIS  Hypermetabolism corresponding with underdistention involving the sigmoid colon and rectum. This measures a S.U.V. max of 15.3, including on image 148. There are surgical changes within the surrounding mesocolon, with suggestion of prominent perirectal vessels.  SKELETON  Likely degenerative hypermetabolism about the sternal manubrial joint.  CT IMAGES PERFORMED FOR ATTENUATION CORRECTION  Mild degradation secondary to patient body habitus. No significant findings within the neck. Chest findings deferred to today's chest CT, not yet performed. Small hiatal hernia. Mild cardiomegaly.  Left paracentral pelvic anterior wall hernia containing nonobstructive small bowel. Hysterectomy. 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 Left glenohumeral joint osteoarthritis with prior right glenohumeral  joint arthroplasty.  IMPRESSION: 1. Left breast primary with left axillary nodal metastasis. 2. No evidence of extrathoracic hypermetabolic metastatic disease. 3. Please see chest CT, scheduled for later today. 4. Right thyroid enlargement and heterogeneity with dominant right-sided mass. Consider thyroid ultrasound for further evaluation. 5. Hypermetabolism within the sigmoid and rectum which could be physiologic or due to colitis. Questionable surrounding hyperemia and surgical changes in this area. 6. Incidental findings, including small bowel containing left pelvic wall hernia.   Electronically Signed   By: Abigail Miyamoto M.D.   On: 03/06/2014 11:56   Ir Fluoro Guide Cv Line Right  03/08/2014   CLINICAL DATA:  Breast Lopez  EXAM: TUNNEL POWER PORT PLACEMENT WITH SUBCUTANEOUS POCKET UTILIZING ULTRASOUND & FLOUROSCOPY  FLUOROSCOPY TIME:  36 seconds.  MEDICATIONS AND MEDICAL HISTORY: Versed two mg, Fentanyl 50 mcg.  As antibiotic prophylaxis, Ancef was ordered pre-procedure and administered intravenously within one hour of incision.  ANESTHESIA/SEDATION: Moderate sedation time: 30 minutes  CONTRAST:  None  PROCEDURE: After written informed consent was obtained, patient was placed in the supine position on angiographic table. The right neck and chest was prepped and draped in a sterile fashion. Lidocaine was utilized for local anesthesia. The right internal jugular vein was noted to be patent initially with ultrasound. Under sonographic guidance, a micropuncture needle was inserted into the right IJ vein (Ultrasound and fluoroscopic image documentation was performed). The needle was removed over an 018 wire which was exchanged for a Amplatz. This was advanced into the IVC. An 8-French dilator was advanced over the Amplatz.  A small incision was made in the right upper chest over the anterior right second rib. Utilizing blunt dissection, a subcutaneous pocket was created in the caudal direction. The  pocket was  irrigated with a copious amount of sterile normal saline. The port catheter was tunneled from the chest incision, and out the neck incision. The reservoir was inserted into the subcutaneous pocket and secured with two 3-0 Ethilon stitches. A peel-away sheath was advanced over the Amplatz wire. The port catheter was cut to measure length and inserted through the peel-away sheath. The peel-away sheath was removed. The chest incision was closed with 3-0 Vicryl interrupted stitches for the subcutaneous tissue and a running of 4-0 Vicryl subcuticular stitch for the skin. The neck incision was closed with a 4-0 Vicryl subcuticular stitch. Derma-bond was applied to both surgical incisions. The port reservoir was flushed and instilled with heparinized saline. No complications.  FINDINGS: A right IJ vein Port-A-Cath is in place with its tip at the cavoatrial junction.  IMPRESSION: Successful 8 French right internal jugular vein power port placement with its tip at the SVC/RA junction.   Electronically Signed   By: Maryclare Bean M.D.   On: 03/08/2014 13:58   Ir US Guide Vasc Access Right  03/08/2014   CLINICAL DATA:  Breast Lopez  EXAM: TUNNEL POWER PORT PLACEMENT WITH SUBCUTANEOUS POCKET UTILIZING ULTRASOUND & FLOUROSCOPY  FLUOROSCOPY TIME:  36 seconds.  MEDICATIONS AND MEDICAL HISTORY: Versed two mg, Fentanyl 50 mcg.  As antibiotic prophylaxis, Ancef was ordered pre-procedure and administered intravenously within one hour of incision.  ANESTHESIA/SEDATION: Moderate sedation time: 30 minutes  CONTRAST:  None  PROCEDURE: After written informed consent was obtained, patient was placed in the supine position on angiographic table. The right neck and chest was prepped and draped in a sterile fashion. Lidocaine was utilized for local anesthesia. The right internal jugular vein was noted to be patent initially with ultrasound. Under sonographic guidance, a micropuncture needle was inserted into the right IJ vein (Ultrasound and  fluoroscopic image documentation was performed). The needle was removed over an 018 wire which was exchanged for a Amplatz. This was advanced into the IVC. An 8-French dilator was advanced over the Amplatz.  A small incision was made in the right upper chest over the anterior right second rib. Utilizing blunt dissection, a subcutaneous pocket was created in the caudal direction. The pocket was irrigated with a copious amount of sterile normal saline. The port catheter was tunneled from the chest incision, and out the neck incision. The reservoir was inserted into the subcutaneous pocket and secured with two 3-0 Ethilon stitches. A peel-away sheath was advanced over the Amplatz wire. The port catheter was cut to measure length and inserted through the peel-away sheath. The peel-away sheath was removed. The chest incision was closed with 3-0 Vicryl interrupted stitches for the subcutaneous tissue and a running of 4-0 Vicryl subcuticular stitch for the skin. The neck incision was closed with a 4-0 Vicryl subcuticular stitch. Derma-bond was applied to both surgical incisions. The port reservoir was flushed and instilled with heparinized saline. No complications.  FINDINGS: A right IJ vein Port-A-Cath is in place with its tip at the cavoatrial junction.  IMPRESSION: Successful 8 French right internal jugular vein power port placement with its tip at the SVC/RA junction.   Electronically Signed   By: Maryclare Bean M.D.   On: 03/08/2014 13:58   Mm Digital Diagnostic Bilat  02/20/2014   CLINICAL DATA:  63 year old female with a left breast palpable abnormality. The patient had a previously seen probably benign asymmetry in the left breast which she failed to follow-up.  EXAM: DIGITAL DIAGNOSTIC  LEFT MAMMOGRAM  WITH CAD  ULTRASOUND LEFT BREAST  COMPARISON:  Previous exams.  ACR Breast Density Category b: There are scattered areas of fibroglandular density.  FINDINGS: There is a high density mass in the outer left breast with  associated distortion measuring up to 3.7 cm and confirmed on the additional spot compression tangential view. This corresponds to the site of palpable concern in the left breast. In addition, there are morphologically abnormal lymph nodes in the left axilla.  Mammographic images were processed with CAD.  Physical examination at site of palpable concern in the left breast reveals a firm mass at the approximate 12 to 1 o'clock position.  Targeted ultrasound of the left breast was performed demonstrating an irregular hypoechoic mass at 1 o'clock 2 cm from the nipple measuring 2.6 x 2.5 x 2.6 cm. This corresponds with mammography findings.  Enlarged and morphologically abnormal lymph nodes are seen in the left axilla, the largest of which measures 3 x 2.1 x 2 cm.  IMPRESSION: Highly suspicious left breast mass and left axillary lymphadenopathy.  RECOMMENDATION: Ultrasound-guided biopsy of the mass in the left breast as well as the enlarged left axillary lymph node is recommended. This is scheduled for Wednesday June 10th at 11:30 a.m.  I have discussed the findings and recommendations with the patient. Results were also provided in writing at the conclusion of the visit. If applicable, a reminder letter will be sent to the patient regarding the next appointment.  BI-RADS CATEGORY  5: Highly suggestive of malignancy.   Electronically Signed   By: Everlean Alstrom M.D.   On: 02/20/2014 13:42   Mm Digital Diagnostic Unilat L  02/22/2014   CLINICAL DATA:  Post biopsy of a highly suspicious left breast mass and left axillary lymph node.  EXAM: DIAGNOSTIC LEFT MAMMOGRAM POST ULTRASOUND BIOPSY  COMPARISON:  Previous exams  FINDINGS: Mammographic images were obtained following ultrasound guided biopsy of a highly suspicious mass in the upper left breast as well as of a highly suspicious left axillary lymph node. A ribbon shaped biopsy marking clip is present in the targeted region of the mass.  IMPRESSION: Appropriate  positioning of ribbon shaped biopsy marking clip in the left breast post biopsy of a highly suspicious mass at 12 o'clock.  Final Assessment: Post Procedure Mammograms for Marker Placement   Electronically Signed   By: Everlean Alstrom M.D.   On: 02/22/2014 12:33   US Breast Ltd Uni Left Inc Axilla  02/20/2014   CLINICAL DATA:  63 year old female with a left breast palpable abnormality. The patient had a previously seen probably benign asymmetry in the left breast which she failed to follow-up.  EXAM: DIGITAL DIAGNOSTIC  LEFT MAMMOGRAM WITH CAD  ULTRASOUND LEFT BREAST  COMPARISON:  Previous exams.  ACR Breast Density Category b: There are scattered areas of fibroglandular density.  FINDINGS: There is a high density mass in the outer left breast with associated distortion measuring up to 3.7 cm and confirmed on the additional spot compression tangential view. This corresponds to the site of palpable concern in the left breast. In addition, there are morphologically abnormal lymph nodes in the left axilla.  Mammographic images were processed with CAD.  Physical examination at site of palpable concern in the left breast reveals a firm mass at the approximate 12 to 1 o'clock position.  Targeted ultrasound of the left breast was performed demonstrating an irregular hypoechoic mass at 1 o'clock 2 cm from the nipple measuring 2.6 x 2.5 x 2.6 cm. This corresponds  with mammography findings.  Enlarged and morphologically abnormal lymph nodes are seen in the left axilla, the largest of which measures 3 x 2.1 x 2 cm.  IMPRESSION: Highly suspicious left breast mass and left axillary lymphadenopathy.  RECOMMENDATION: Ultrasound-guided biopsy of the mass in the left breast as well as the enlarged left axillary lymph node is recommended. This is scheduled for Wednesday June 10th at 11:30 a.m.  I have discussed the findings and recommendations with the patient. Results were also provided in writing at the conclusion of the visit.  If applicable, a reminder letter will be sent to the patient regarding the next appointment.  BI-RADS CATEGORY  5: Highly suggestive of malignancy.   Electronically Signed   By: Everlean Alstrom M.D.   On: 02/20/2014 13:42   [place] Lt Breast Bx W Loc Dev 1st Lesion Img Bx Spec US Guide  02/23/2014   ADDENDUM REPORT: 02/23/2014 16:25  ADDENDUM: THE PATHOLOGY ASSOCIATED WITH THE LEFT BREAST ULTRASOUND-GUIDED CORE BIOPSY DEMONSTRATED GRADE 3 INVASIVE DUCTAL CARCINOMA. THE PATHOLOGY ASSOCIATED WITH THE LEFT AXILLARY LYMPH NODE ULTRASOUND-GUIDED CORE BIOPSY DEMONSTRATED METASTATIC CARCINOMA WITHIN THE LYMPH NODE. THE PATHOLOGY IS CONCORDANT WITH THE IMAGING FINDINGS. I HAVE DISCUSSED FINDINGS WITH THE PATIENT BY TELEPHONE AND ANSWERED HER QUESTIONS. PATIENT STATES HER BIOPSY SITES ARE CLEAN AND DRY WITHOUT HEMATOMA FORMATION OR SIGNS OF INFECTION. THERE IS MILD TENDERNESS. THE PATIENT IS SCHEDULED FOR THE BREAST Lopez [facility] ON 06/17 2015. THE PATIENT IS ALSO SCHEDULED FOR BREAST MRI ON 03/01/2014 AT 7:15 A.M. POST BIOPSY WOUND CARE INSTRUCTIONS WERE REVIEWED WITH THE PATIENT. THE PATIENT WAS ENCOURAGED TO CALL OUR BREAST [facility]ENTER FOR ADDITIONAL QUESTIONS OR CONCERNS.   Electronically Signed   By: Luberta Robertson M.D.   On: 02/23/2014 16:25   02/23/2014   CLINICAL DATA:  Highly suspicious left breast mass and left axillary lymph node.  EXAM: ULTRASOUND GUIDED LEFT BREAST CORE NEEDLE BIOPSY  COMPARISON:  Previous exams.  PROCEDURE: I met with the patient and we discussed the procedure of ultrasound-guided biopsy, including benefits and alternatives. We discussed the high likelihood of a successful procedure. We discussed the risks of the procedure including infection, bleeding, tissue injury, clip migration, and inadequate sampling. Informed written consent was given. The usual time-out protocol was performed immediately prior to the procedure.  1. Using sterile technique and 2% Lidocaine as local  anesthetic, under direct ultrasound visualization, a 12 gauge vacuum-assisteddevice was used to perform biopsy of highly suspicious mass in the left breast at 12 o'clock using a lateral approach. At the conclusion of the procedure, a ribbon shaped tissue marker clip was deployed into the biopsy cavity. Follow-up 2-view mammogram was performed and dictated separately.  2. Using sterile technique and 2% Lidocaine as local anesthetic, under direct ultrasound visualization, a 12 gauge vacuum-assisteddevice was used to perform biopsy of the enlarged and morphologically abnormal lymph node in the low left axilla using a lateral approach.  IMPRESSION: Ultrasound-guided biopsy of a highly suspicious mass in the left breast as well as of a highly suspicious left axillary lymph node. No apparent complications.  Electronically Signed: By: Everlean Alstrom M.D. On: 02/22/2014 12:09   [place] Lt Breast Bx W Loc Dev Ea Add Lesion Img Bx Spec US Guide  lly Signed: By: Everlean Alstrom M.D. On: 02/22/2014 12:09   ASSESSMENT: 63 y.o. [place] woman s/p breast and left axillary lymph node biopsy 02/22/2014, both positive for a clinical T2 N2, stage IIIA invasive ductal carcinoma, grade 3, triple  negative, with an MIB-1 of 90%  (1) neoadjuvant chemotherapy started 03/13/2014, with cyclophosphamide and doxorubicin in dose dense fashion x4, with Neulasta support on day 2, to be followed by weekly carboplatin and paclitaxel x12  (2) definitive surgery to follow chemotherapy  (3) radiation to follow surgery  (4) remote history of partial colectomy for early stage colon Lopez incidentally found during [facility] in the 1980s  (5) genetics testing pending  (6) staging studies showed right-sided lung nodules, too small to characterize, and a dominant right-sided thyroid nodule unchanged in size as compared to 2011  PLAN: [name] tolerated her first dose of AC well. CBC reviewed with her today. She is neutropenic today.  Recommended that she monitor her temperature and call us if she has a fever of 100.5 or higher. She was instructed to take Cipro 500 mg BID for 5 days. She states that she has Cipro at home already.  She will return next week for assessment prior to her third cycle of AC.   The patient has a good understanding of the overall plan. She agrees with it. She knows the goal of treatment in her case is cure. She will call with any problems that may develop before her next visit here.  Mikey Bussing, NP   04/03/2014 4:01 PM

## 2014-04-03 NOTE — Patient Instructions (Signed)
Monitor your temperature. Call if you have a fever of 100.5 or higher.  Begin Cipro 500 mg twice a day for 5 days. Prescription has been sent to

## 2014-04-05 ENCOUNTER — Ambulatory Visit: Payer: Medicare Other

## 2014-04-05 DIAGNOSIS — IMO0001 Reserved for inherently not codable concepts without codable children: Secondary | ICD-10-CM | POA: Diagnosis not present

## 2014-04-07 ENCOUNTER — Ambulatory Visit: Payer: Medicare Other | Admitting: Physical Therapy

## 2014-04-07 DIAGNOSIS — IMO0001 Reserved for inherently not codable concepts without codable children: Secondary | ICD-10-CM | POA: Diagnosis not present

## 2014-04-10 ENCOUNTER — Other Ambulatory Visit (HOSPITAL_BASED_OUTPATIENT_CLINIC_OR_DEPARTMENT_OTHER): Payer: Medicare Other

## 2014-04-10 ENCOUNTER — Ambulatory Visit: Payer: Medicare Other

## 2014-04-10 ENCOUNTER — Other Ambulatory Visit: Payer: Self-pay | Admitting: *Deleted

## 2014-04-10 ENCOUNTER — Encounter: Payer: Self-pay | Admitting: Hematology

## 2014-04-10 ENCOUNTER — Ambulatory Visit (HOSPITAL_BASED_OUTPATIENT_CLINIC_OR_DEPARTMENT_OTHER): Payer: Medicare Other | Admitting: Hematology

## 2014-04-10 ENCOUNTER — Ambulatory Visit (HOSPITAL_BASED_OUTPATIENT_CLINIC_OR_DEPARTMENT_OTHER): Payer: Medicare Other

## 2014-04-10 VITALS — BP 145/86 | HR 90 | Temp 98.7°F | Resp 19 | Ht 61.0 in | Wt 245.2 lb

## 2014-04-10 DIAGNOSIS — C773 Secondary and unspecified malignant neoplasm of axilla and upper limb lymph nodes: Secondary | ICD-10-CM

## 2014-04-10 DIAGNOSIS — Z5111 Encounter for antineoplastic chemotherapy: Secondary | ICD-10-CM

## 2014-04-10 DIAGNOSIS — C50412 Malignant neoplasm of upper-outer quadrant of left female breast: Secondary | ICD-10-CM

## 2014-04-10 DIAGNOSIS — C50919 Malignant neoplasm of unspecified site of unspecified female breast: Secondary | ICD-10-CM

## 2014-04-10 DIAGNOSIS — Z171 Estrogen receptor negative status [ER-]: Secondary | ICD-10-CM

## 2014-04-10 DIAGNOSIS — Z95828 Presence of other vascular implants and grafts: Secondary | ICD-10-CM

## 2014-04-10 DIAGNOSIS — C189 Malignant neoplasm of colon, unspecified: Secondary | ICD-10-CM

## 2014-04-10 LAB — CBC WITH DIFFERENTIAL/PLATELET
BASO%: 0.3 % (ref 0.0–2.0)
Basophils Absolute: 0 10*3/uL (ref 0.0–0.1)
EOS%: 0.2 % (ref 0.0–7.0)
Eosinophils Absolute: 0 10*3/uL (ref 0.0–0.5)
HCT: 31.3 % — ABNORMAL LOW (ref 34.8–46.6)
HGB: 10.4 g/dL — ABNORMAL LOW (ref 11.6–15.9)
LYMPH%: 11.2 % — ABNORMAL LOW (ref 14.0–49.7)
MCH: 28.8 pg (ref 25.1–34.0)
MCHC: 33.2 g/dL (ref 31.5–36.0)
MCV: 86.7 fL (ref 79.5–101.0)
MONO#: 0.5 10*3/uL (ref 0.1–0.9)
MONO%: 8.6 % (ref 0.0–14.0)
NEUT%: 79.7 % — ABNORMAL HIGH (ref 38.4–76.8)
NEUTROS ABS: 4.9 10*3/uL (ref 1.5–6.5)
Platelets: 162 10*3/uL (ref 145–400)
RBC: 3.61 10*6/uL — AB (ref 3.70–5.45)
RDW: 13.3 % (ref 11.2–14.5)
WBC: 6.2 10*3/uL (ref 3.9–10.3)
lymph#: 0.7 10*3/uL — ABNORMAL LOW (ref 0.9–3.3)

## 2014-04-10 LAB — COMPREHENSIVE METABOLIC PANEL (CC13)
ALBUMIN: 3 g/dL — AB (ref 3.5–5.0)
ALT: 22 U/L (ref 0–55)
AST: 12 U/L (ref 5–34)
Alkaline Phosphatase: 121 U/L (ref 40–150)
Anion Gap: 7 mEq/L (ref 3–11)
BUN: 9.8 mg/dL (ref 7.0–26.0)
CALCIUM: 9.1 mg/dL (ref 8.4–10.4)
CHLORIDE: 112 meq/L — AB (ref 98–109)
CO2: 25 mEq/L (ref 22–29)
CREATININE: 0.9 mg/dL (ref 0.6–1.1)
GLUCOSE: 109 mg/dL (ref 70–140)
POTASSIUM: 3.8 meq/L (ref 3.5–5.1)
Sodium: 143 mEq/L (ref 136–145)
Total Bilirubin: 0.35 mg/dL (ref 0.20–1.20)
Total Protein: 6.3 g/dL — ABNORMAL LOW (ref 6.4–8.3)

## 2014-04-10 MED ORDER — DEXAMETHASONE SODIUM PHOSPHATE 20 MG/5ML IJ SOLN
12.0000 mg | Freq: Once | INTRAMUSCULAR | Status: AC
  Administered 2014-04-10: 12 mg via INTRAVENOUS

## 2014-04-10 MED ORDER — PALONOSETRON HCL INJECTION 0.25 MG/5ML
INTRAVENOUS | Status: AC
  Filled 2014-04-10: qty 5

## 2014-04-10 MED ORDER — DEXAMETHASONE 4 MG PO TABS: ORAL_TABLET | ORAL | Status: DC

## 2014-04-10 MED ORDER — DEXAMETHASONE SODIUM PHOSPHATE 20 MG/5ML IJ SOLN
INTRAMUSCULAR | Status: AC
  Filled 2014-04-10: qty 5

## 2014-04-10 MED ORDER — ONDANSETRON HCL 8 MG PO TABS: 8.0000 mg | ORAL_TABLET | Freq: Two times a day (BID) | ORAL | Status: DC | PRN

## 2014-04-10 MED ORDER — PALONOSETRON HCL INJECTION 0.25 MG/5ML
0.2500 mg | Freq: Once | INTRAVENOUS | Status: AC
  Administered 2014-04-10: 0.25 mg via INTRAVENOUS

## 2014-04-10 MED ORDER — SODIUM CHLORIDE 0.9 % IV SOLN
150.0000 mg | Freq: Once | INTRAVENOUS | Status: AC
  Administered 2014-04-10: 150 mg via INTRAVENOUS
  Filled 2014-04-10: qty 5

## 2014-04-10 MED ORDER — DOXORUBICIN HCL CHEMO IV INJECTION 2 MG/ML
60.0000 mg/m2 | Freq: Once | INTRAVENOUS | Status: AC
  Administered 2014-04-10: 132 mg via INTRAVENOUS
  Filled 2014-04-10: qty 66

## 2014-04-10 MED ORDER — SODIUM CHLORIDE 0.9 % IV SOLN
Freq: Once | INTRAVENOUS | Status: AC
  Administered 2014-04-10: 12:00:00 via INTRAVENOUS

## 2014-04-10 MED ORDER — HEPARIN SOD (PORK) LOCK FLUSH 100 UNIT/ML IV SOLN
500.0000 [IU] | Freq: Once | INTRAVENOUS | Status: AC | PRN
  Administered 2014-04-10: 500 [IU]
  Filled 2014-04-10: qty 5

## 2014-04-10 MED ORDER — SODIUM CHLORIDE 0.9 % IJ SOLN
10.0000 mL | INTRAMUSCULAR | Status: DC | PRN
  Administered 2014-04-10: 10 mL
  Filled 2014-04-10: qty 10

## 2014-04-10 MED ORDER — SODIUM CHLORIDE 0.9 % IV SOLN
600.0000 mg/m2 | Freq: Once | INTRAVENOUS | Status: AC
  Administered 2014-04-10: 1320 mg via INTRAVENOUS
  Filled 2014-04-10: qty 66

## 2014-04-10 MED ORDER — SODIUM CHLORIDE 0.9 % IJ SOLN
10.0000 mL | INTRAMUSCULAR | Status: DC | PRN
  Administered 2014-04-10: 10 mL via INTRAVENOUS
  Filled 2014-04-10: qty 10

## 2014-04-10 NOTE — Patient Instructions (Signed)
[facility] Discharge Instructions for Patients Receiving Chemotherapy  Today you received the following chemotherapy agents: Adriamycin, Cytoxan  To help prevent nausea and vomiting after your treatment, we encourage you to take your nausea medication as prescribed by your physician.   If you develop nausea and vomiting that is not controlled by your nausea medication, call the clinic.   BELOW ARE SYMPTOMS THAT SHOULD BE REPORTED IMMEDIATELY:  *FEVER GREATER THAN 100.5 F  *CHILLS WITH OR WITHOUT FEVER  NAUSEA AND VOMITING THAT IS NOT CONTROLLED WITH YOUR NAUSEA MEDICATION  *UNUSUAL SHORTNESS OF BREATH  *UNUSUAL BRUISING OR BLEEDING  TENDERNESS IN MOUTH AND THROAT WITH OR WITHOUT PRESENCE OF ULCERS  *URINARY PROBLEMS  *BOWEL PROBLEMS  UNUSUAL RASH Items with * indicate a potential emergency and should be followed up as soon as possible.  Feel free to call the clinic you have any questions or concerns. The clinic phone number is [phone].    

## 2014-04-10 NOTE — Progress Notes (Signed)
[place]  [phone] Fax:(336) [phone]     ID: [name] DOB: 05/24/1951  MR#: [id number]  ZCH#:[id number]  PCP: Ruth Greenland, MD GYN: SU: [name] OTHER MD: [name]  CHIEF COMPLAINT: Stage III breast Lopez FOR CHEMO F/U.  CURRENT TREATMENT: Neoadjuvant chemotherapy AC Cycle 3 day 1 today.   BREAST Lopez HISTORY:  [name] herself palpated a mass in her left breast, and immediately brought it to the attention of her primary care physician, Ruth. Baird Lopez, who confirmed the finding and setup the patient for bilateral diagnostic mammography at the breast Center 02/20/2014. This showed a high density mass in the outer left breast measuring up to 3.7 cm. It corresponded to the site of palpable concern. In addition the normal 4 logically abnormal lymph nodes in the left axilla. The mass was palpable by exam, and by ultrasonography measured 2.6 cm. There were enlarged and morphologically abnormal lymph nodes in the left axilla, largest measuring 3 cm.  Biopsy of the left breast mass in question and 1 of the abnormal axillary lymph nodes 02/22/2014 showed ([id number]) biopsies to be positive for an invasive ductal carcinoma, grade 3, triple negative, with an MIB-1 of 90%. Specifically the HER-2 signals ratio was 1.05, and the number per cell 2.00.  MRI of the breasts 03/01/2014 showed a 3.8 cm enhancing mass with areas of apparent necrosis in the left breast, as well as the biopsy tract extending laterally, the combined measure 5.3 cm. There were no other masses or areas of abnormal enhancement. There were multiple abnormally enlarged left axillary lymph nodes with loss of the normal fatty hilum. These included level I and retropectoral lymph nodes.  She started neoadjuvant chemotherapy started 03/13/2014, with cyclophosphamide and doxorubicin in dose dense fashion x4, with Neulasta support on day 2, to be followed by weekly carboplatin and  paclitaxel x12. Definitive surgery to follow chemotherapy with [name]. Radiation to follow surgery. Today is cycle 3 of her AC   INTERVAL HISTORY:  [name] returns today for followup of her breast Lopez accompanied by her daughter. Today is day 1 cycle 3 of 4 planned cycles of doxorubicin and cyclophosphamide given every 2 weeks with Neulasta support, to be followed by weekly carboplatin and paclitaxel x12. Patient did become neutropenic after second cycle and was placed on ciprofloxacin which she completed almost 2 weeks. At this time her white count is normal and I told her to stop the ciprofloxacin. She also needs a prescription for her Decadron and Zofran. Otherwise, she is cruising along. She thinks that the mass in her left breast has shrunk significantly almost by 50-60%.  REVIEW OF SYSTEMS: [name] feels well overall today. She reports mild fatigue, but this is not affecting her performance status. Reports dyspnea on exertion, but she is not short of breath at rest. Denies cough. She has had no fever, mouth sores, nausea, vomiting, dizziness, cough, or any change in bowel or bladder habits. A detailed review of systems today was otherwise noncontributory  PAST MEDICAL HISTORY: Past Medical History  Diagnosis Date  . Colon Lopez   . Hx of rotator cuff surgery     PAST SURGICAL HISTORY: Past Surgical History  Procedure Laterality Date  . Abdominal hysterectomy    . Colon surgery      colectomy/colostomy with takedown    FAMILY HISTORY Family History  Problem Relation Age of Onset  . Lopez Mother 79    breast  . Lopez Maternal Aunt  breast Lopez at unknown age   the patient's father died at the age of 44 following a stroke in the setting of diabetes; the patient's mother is living at 88. The patient has 3 brothers and 2 sisters. [name], [name], was diagnosed with breast Lopez in her early 77s. There is no other breast or ovarian Lopez in the family.  The patient herself was diagnosed with watts by history he is an incidentally found stage I colon carcinoma noted at the time of her hysterectomy. She had a partial colectomy but no adjuvant treatment. We have no records regarding that procedure  GYNECOLOGIC HISTORY:  No LMP recorded. Patient has had a hysterectomy. Menarche age 86, first live birth age 45, the patient underwent total abdominal hysterectomy with bilateral salpingo-oophorectomy in her 101s. She did not take hormone replacement. She did not take oral contraceptives at any point.  SOCIAL HISTORY:  Aveyah work for [employer] for about 40 years, retiring in 2008. She is divorced and lives alone, with no pets. Her daughter [name] works for [employer] in Press [employer]. The second daughter, [name], works as a Research scientist (physical sciences) for [employer]. The patient has 6 grandchildren and one great-grandchild. She attends a [employer].   ADVANCED DIRECTIVES: Not in place. The patient intends to name her daughter [name]. [name] is work number is [id number]. On the patient's 03/01/2014 visit she was given a copy of the appropriate documents to complete and notarize at her discretion   HEALTH MAINTENANCE: History  Substance Use Topics  . Smoking status: Never Smoker   . Smokeless tobacco: Not on file  . Alcohol Use: No     Colonoscopy: 2000  PAP:  Bone density: 05/23/2004, at [facility] hospital; normal  Lipid panel:  No Known Allergies  Current Outpatient Prescriptions  Medication Sig Dispense Refill  . acetaminophen (TYLENOL) 500 MG tablet Take 1,000 mg by mouth every 6 (six) hours as needed for mild pain or moderate pain.      . ciprofloxacin (CIPRO) 500 MG tablet Take 1 tablet (500 mg total) by mouth 2 (two) times daily.  10 tablet  0  . dexamethasone (DECADRON) 4 MG tablet Take 2 tablets by mouth once a day on the day after chemotherapy and then take 2 tablets two times a day for 2 days. Take  with food.  30 tablet  1  . lidocaine-prilocaine (EMLA) cream Apply 1 application topically once. Apply to port 1-2 hours prior to access.  30 g  1  . LORazepam (ATIVAN) 0.5 MG tablet Take 1 tablet (0.5 mg total) by mouth every 6 (six) hours as needed (Nausea or vomiting).  30 tablet  0  . ondansetron (ZOFRAN) 8 MG tablet Take 1 tablet (8 mg total) by mouth 2 (two) times daily as needed. Start on the third day after chemotherapy.  30 tablet  1  . prochlorperazine (COMPAZINE) 10 MG tablet Take 1 tablet (10 mg total) by mouth every 6 (six) hours as needed (Nausea or vomiting).  30 tablet  1   No current facility-administered medications for this visit.   Facility-Administered Medications Ordered in Other Visits  Medication Dose Route Frequency Provider Last Rate Last Dose  . cyclophosphamide (CYTOXAN) 1,320 mg in sodium chloride 0.9 % 250 mL chemo infusion  600 mg/m2 (Treatment Plan Actual) Intravenous Once Thresea Doble Marla Roe, MD      . Dexamethasone Sodium Phosphate (DECADRON) injection 12 mg  12 mg Intravenous Once Tynslee Bowlds  Marla Roe, MD      . DOXOrubicin (ADRIAMYCIN) chemo injection 132 mg  60 mg/m2 (Treatment Plan Actual) Intravenous Once Gleb Mcguire Marla Roe, MD      . fosaprepitant (EMEND) 150 mg in sodium chloride 0.9 % 145 mL IVPB  150 mg Intravenous Once Garland Smouse Marla Roe, MD      . heparin lock flush 100 unit/mL  500 Units Intracatheter Once PRN Haunani Dickard Marla Roe, MD      . palonosetron (ALOXI) injection 0.25 mg  0.25 mg Intravenous Once Juanetta Negash Marla Roe, MD      . sodium chloride 0.9 % injection 10 mL  10 mL Intracatheter PRN Raelynne Ludwick Marla Roe, MD        OBJECTIVE: Middle-aged [place] American woman who appears stated age 27 Vitals:   04/10/14 1119  BP: 145/86  Pulse: 90  Temp: 98.7 F (37.1 C)  Resp: 19     Body mass index is 46.35 kg/(m^2).    ECOG FS:0 - Asymptomatic  Ocular: Sclerae unicteric, EOMs intact Ear-nose-throat: Oropharynx clear and  moist Lymphatic: No cervical or supraclavicular adenopathy Lungs no rales or rhonchi Heart regular rate and rhythm Abd soft, obese, nontender, positive bowel sounds MSK no focal spinal tenderness Neuro: non-focal, well-oriented, positive affect Breasts: Deferred Skin: Some hyperpigmentation already noted over the hands  LAB RESULTS:  CMP     Component Value Date/Time   NA 143 04/10/2014 1050   NA 142 04/13/2010 0403   K 3.8 04/10/2014 1050   K 3.4* 04/13/2010 0403   CL 107 04/13/2010 0403   CO2 25 04/10/2014 1050   CO2 29 04/13/2010 0403   GLUCOSE 109 04/10/2014 1050   GLUCOSE 110* 04/13/2010 0403   BUN 9.8 04/10/2014 1050   BUN 5* 04/13/2010 0403   CREATININE 0.9 04/10/2014 1050   CREATININE 0.81 04/13/2010 0403   CALCIUM 9.1 04/10/2014 1050   CALCIUM 8.4 04/13/2010 0403   PROT 6.3* 04/10/2014 1050   PROT 6.9 04/05/2008 1120   ALBUMIN 3.0* 04/10/2014 1050   ALBUMIN 3.6 04/05/2008 1120   AST 12 04/10/2014 1050   AST 16 04/05/2008 1120   ALT 22 04/10/2014 1050   ALT 14 04/05/2008 1120   ALKPHOS 121 04/10/2014 1050   ALKPHOS 103 04/05/2008 1120   BILITOT 0.35 04/10/2014 1050   BILITOT 1.2 04/05/2008 1120   GFRNONAA >60 04/13/2010 0403   GFRAA  Value: >60        The eGFR has been calculated using the MDRD equation. This calculation has not been validated in all clinical situations. eGFR's persistently <60 mL/min signify possible Chronic Kidney Disease. 04/13/2010 0403    I No results found for this basename: SPEP,  UPEP,   kappa and lambda light chains    Lab Results  Component Value Date   WBC 6.2 04/10/2014   NEUTROABS 4.9 04/10/2014   HGB 10.4* 04/10/2014   HCT 31.3* 04/10/2014   MCV 86.7 04/10/2014   PLT 162 04/10/2014      Chemistry      Component Value Date/Time   NA 143 04/10/2014 1050   NA 142 04/13/2010 0403   K 3.8 04/10/2014 1050   K 3.4* 04/13/2010 0403   CL 107 04/13/2010 0403   CO2 25 04/10/2014 1050   CO2 29 04/13/2010 0403   BUN 9.8 04/10/2014 1050   BUN 5* 04/13/2010 0403    CREATININE 0.9 04/10/2014 1050   CREATININE 0.81 04/13/2010 0403      Component Value Date/Time   CALCIUM  9.1 04/10/2014 1050   CALCIUM 8.4 04/13/2010 0403   ALKPHOS 121 04/10/2014 1050   ALKPHOS 103 04/05/2008 1120   AST 12 04/10/2014 1050   AST 16 04/05/2008 1120   ALT 22 04/10/2014 1050   ALT 14 04/05/2008 1120   BILITOT 0.35 04/10/2014 1050   BILITOT 1.2 04/05/2008 1120       STUDIES: Ct Chest W Contrast  03/06/2014   CLINICAL DATA:  New diagnosis of left-sided breast Lopez.  EXAM: CT CHEST WITH CONTRAST  TECHNIQUE: Multidetector CT imaging of the chest was performed during intravenous contrast administration.  CONTRAST:  58m OMNIPAQUE IOHEXOL 300 MG/ML  SOLN  COMPARISON:  Today's PET, dictated separately. Breast MRI of 03/01/2014. Chest CT 04/12/2010  FINDINGS: Lungs/Pleura: 4 mm right upper lobe lung nodule on image 16 is similar to image 28 of the prior chest CT, consistent with a benign etiology.  Right lower lobe 4 mm nodule on image 47/ series 5. Not readily apparent on the prior chest CT.  Favor dependent scarring in the right lower lobe more superiorly on image 31.  No pleural fluid.  Heart/Mediastinum: No supraclavicular adenopathy. No right axillary adenopathy. Left axillary adenopathy. Index node measures 2.4 x 2.3 cm on image 11.  Left paracentral deep lateral breast mass measures 3.2 x 2.9 cm on image 14.  Bilateral low-density thyroid nodules. The dominant 2.9 cm lesion on image 7 corresponds to mild hypermetabolism on PET. Grossly similar to on the 04/12/2010 exam.  Mild cardiomegaly, without pericardial effusion. No central pulmonary embolism, on this non-dedicated study. No mediastinal or hilar adenopathy. A small hiatal hernia. Fluid level in the thoracic esophagus on image 18.  Upper Abdomen: Normal imaged portions of the liver, spleen, distal stomach, pancreas, adrenal glands, kidneys, gallbladder, biliary tract. DWilkinsonwithin the splenic flexure the colon.   Bones/Musculoskeletal:  Right shoulder arthroplasty.  IMPRESSION: 1. Left breast primary with left axillary nodal metastasis. 2. Right-sided lung nodules. A lower lobe nodule is not readily apparent on the prior exam and warrants followup attention. 3. Bilateral thyroid nodules with a dominant right-sided lesion which is grossly similar in size back to 2011, but corresponds with mild hypermetabolism at PET. Consider ultrasound. 4. Small hiatal hernia. Esophageal air fluid level suggests dysmotility or gastroesophageal reflux.   Electronically Signed   By: KAbigail MiyamotoM.D.   On: 03/06/2014 12:48   Mr Breast Bilateral W Wo Contrast  03/01/2014   CLINICAL DATA:  Recently diagnosed left breast invasive ductal carcinoma and metastatic left axillary lymph node.  LABS:  [place] [facility] [place] [facility]  [address]  Results:  BUN 9 mg/dL,  Creatinine 1.0 mg/dL.  EXAM: BILATERAL BREAST MRI WITH AND WITHOUT CONTRAST  TECHNIQUE: Multiplanar, multisequence MR images of both breasts were obtained prior to and following the intravenous administration of 262mof MultiHance.  THREE-DIMENSIONAL MR IMAGE RENDERING ON INDEPENDENT WORKSTATION:  Three-dimensional MR images were rendered by post-processing of the original MR data on an independent workstation. The three-dimensional MR images were interpreted, and findings are reported in the following complete MRI report for this study. Three dimensional images were evaluated at the independent DynaCad workstation  COMPARISON:  Recent mammogram, ultrasound and biopsy examinations.  FINDINGS: Breast composition: b.  Scattered fibroglandular tissue.  Background parenchymal enhancement: Minimal  Right breast: No mass or abnormal enhancement.  Left breast: 3.8 x 3.6 x 2.9 cm rounded enhancing mass with areas of nonenhancement compatible with necrosis in the  12 o'clock position of the left breast, containing a biopsy marker clip artifact. There  is also enhancement surrounding the biopsy tract extending lateral to the mass. The tract and mass combined measure 5.3 cm in maximum diameter. The mass and enhancement surrounding the biopsy tract have persistent and plateau enhancement kinetics. No additional masses or areas of abnormal enhancement suspicious for malignancy in the left breast.  Lymph nodes: Multiple abnormally enlarged, rounded left axillary lymph nodes with loss of the normal fatty hila. These include level 1 and retropectoral lymph nodes.  Ancillary findings:  None.  IMPRESSION: 1. 3.8 x 3.6 x 2.9 cm biopsy-proven invasive ductal carcinoma in the 12 o'clock position of the left breast with adjacent post biopsy changes extending laterally, with a combined maximum diameter of 5.3 cm. 2. Multiple metastatic Level 1 and Level 2 left axillary lymph nodes. 3. No evidence of malignancy on the right.  RECOMMENDATION: Treatment plan.  BI-RADS CATEGORY  6: Known biopsy-proven malignancy.   Electronically Signed   By: Enrique Sack M.D.   On: 03/01/2014 09:56   Nm Pet Image Initial (pi) Skull Base To Thigh  03/06/2014   CLINICAL DATA:  Initial treatment strategy for left-sided breast Lopez. Positive lymph nodes.  EXAM: NUCLEAR MEDICINE PET SKULL BASE TO THIGH  TECHNIQUE: 15.2 mCi F-18 FDG was injected intravenously. Full-ring PET imaging was performed from the skull base to thigh after the radiotracer. CT data was obtained and used for attenuation correction and anatomic localization.  FASTING BLOOD GLUCOSE:  Value: 86 mg/dl  COMPARISON:  Breast MR a of 03/01/2014.  FINDINGS: NECK  Mild hypermetabolism corresponding to enlargement and heterogeneity of the right lobe of the thyroid. This measures a S.U.V. max of 5.3 on image 30/series 4. Ill-defined hypoattenuation in this area measures 3.1 x 3.1 cm.  CHEST  Deep left paracentral breast mass which measures 3.1 cm and a S.U.V. max of 15.8 on image 43/series 4.  Left axillary nodal metastasis. An index node  measures 2.5 x 2.4 cm and a S.U.V. max of 12.4 on image 42.  ABDOMEN/PELVIS  Hypermetabolism corresponding with underdistention involving the sigmoid colon and rectum. This measures a S.U.V. max of 15.3, including on image 148. There are surgical changes within the surrounding mesocolon, with suggestion of prominent perirectal vessels.  SKELETON  Likely degenerative hypermetabolism about the sternal manubrial joint.  CT IMAGES PERFORMED FOR ATTENUATION CORRECTION  Mild degradation secondary to patient body habitus. No significant findings within the neck. Chest findings deferred to today's chest CT, not yet performed. Small hiatal hernia. Mild cardiomegaly.  Left paracentral pelvic anterior wall hernia containing nonobstructive small bowel. Hysterectomy. 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 Left glenohumeral joint osteoarthritis with prior right glenohumeral joint arthroplasty.  IMPRESSION: 1. Left breast primary with left axillary nodal metastasis. 2. No evidence of extrathoracic hypermetabolic metastatic disease. 3. Please see chest CT, scheduled for later today. 4. Right thyroid enlargement and heterogeneity with dominant right-sided mass. Consider thyroid ultrasound for further evaluation. 5. Hypermetabolism within the sigmoid and rectum which could be physiologic or due to colitis. Questionable surrounding hyperemia and surgical changes in this area. 6. Incidental findings, including small bowel containing left pelvic wall hernia.   Electronically Signed   By: Abigail Miyamoto M.D.   On: 03/06/2014 11:56   Ir Fluoro Guide Cv Line Right  03/08/2014   CLINICAL DATA:  Breast Lopez  EXAM: TUNNEL POWER PORT PLACEMENT WITH SUBCUTANEOUS POCKET UTILIZING ULTRASOUND & FLOUROSCOPY  FLUOROSCOPY TIME:  36 seconds.  MEDICATIONS AND MEDICAL HISTORY: Versed two mg, Fentanyl 50 mcg.  As antibiotic prophylaxis, Ancef was ordered pre-procedure and administered intravenously within  one hour of incision.  ANESTHESIA/SEDATION: Moderate sedation time: 30 minutes  CONTRAST:  None  PROCEDURE: After written informed consent was obtained, patient was placed in the supine position on angiographic table. The right neck and chest was prepped and draped in a sterile fashion. Lidocaine was utilized for local anesthesia. The right internal jugular vein was noted to be patent initially with ultrasound. Under sonographic guidance, a micropuncture needle was inserted into the right IJ vein (Ultrasound and fluoroscopic image documentation was performed). The needle was removed over an 018 wire which was exchanged for a Amplatz. This was advanced into the IVC. An 8-French dilator was advanced over the Amplatz.  A small incision was made in the right upper chest over the anterior right second rib. Utilizing blunt dissection, a subcutaneous pocket was created in the caudal direction. The pocket was irrigated with a copious amount of sterile normal saline. The port catheter was tunneled from the chest incision, and out the neck incision. The reservoir was inserted into the subcutaneous pocket and secured with two 3-0 Ethilon stitches. A peel-away sheath was advanced over the Amplatz wire. The port catheter was cut to measure length and inserted through the peel-away sheath. The peel-away sheath was removed. The chest incision was closed with 3-0 Vicryl interrupted stitches for the subcutaneous tissue and a running of 4-0 Vicryl subcuticular stitch for the skin. The neck incision was closed with a 4-0 Vicryl subcuticular stitch. Derma-bond was applied to both surgical incisions. The port reservoir was flushed and instilled with heparinized saline. No complications.  FINDINGS: A right IJ vein Port-A-Cath is in place with its tip at the cavoatrial junction.  IMPRESSION: Successful 8 French right internal jugular vein power port placement with its tip at the SVC/RA junction.   Electronically Signed   By: Maryclare Bean  M.D.   On: 03/08/2014 13:58   Ir US Guide Vasc Access Right  03/08/2014   CLINICAL DATA:  Breast Lopez  EXAM: TUNNEL POWER PORT PLACEMENT WITH SUBCUTANEOUS POCKET UTILIZING ULTRASOUND & FLOUROSCOPY  FLUOROSCOPY TIME:  36 seconds.  MEDICATIONS AND MEDICAL HISTORY: Versed two mg, Fentanyl 50 mcg.  As antibiotic prophylaxis, Ancef was ordered pre-procedure and administered intravenously within one hour of incision.  ANESTHESIA/SEDATION: Moderate sedation time: 30 minutes  CONTRAST:  None  PROCEDURE: After written informed consent was obtained, patient was placed in the supine position on angiographic table. The right neck and chest was prepped and draped in a sterile fashion. Lidocaine was utilized for local anesthesia. The right internal jugular vein was noted to be patent initially with ultrasound. Under sonographic guidance, a micropuncture needle was inserted into the right IJ vein (Ultrasound and fluoroscopic image documentation was performed). The needle was removed over an 018 wire which was exchanged for a Amplatz. This was advanced into the IVC. An 8-French dilator was advanced over the Amplatz.  A small incision was made in the right upper chest over the anterior right second rib. Utilizing blunt dissection, a subcutaneous pocket was created in the caudal direction. The pocket was irrigated with a copious amount of sterile normal saline. The port catheter was tunneled from the chest incision, and out the neck incision. The reservoir was inserted into the subcutaneous pocket and secured with two 3-0 Ethilon stitches. A peel-away sheath was advanced over the Amplatz wire. The port catheter was  cut to measure length and inserted through the peel-away sheath. The peel-away sheath was removed. The chest incision was closed with 3-0 Vicryl interrupted stitches for the subcutaneous tissue and a running of 4-0 Vicryl subcuticular stitch for the skin. The neck incision was closed with a 4-0 Vicryl subcuticular  stitch. Derma-bond was applied to both surgical incisions. The port reservoir was flushed and instilled with heparinized saline. No complications.  FINDINGS: A right IJ vein Port-A-Cath is in place with its tip at the cavoatrial junction.  IMPRESSION: Successful 8 French right internal jugular vein power port placement with its tip at the SVC/RA junction.   Electronically Signed   By: Maryclare Bean M.D.   On: 03/08/2014 13:58   Mm Digital Diagnostic Bilat  02/20/2014   CLINICAL DATA:  63 year old female with a left breast palpable abnormality. The patient had a previously seen probably benign asymmetry in the left breast which she failed to follow-up.  EXAM: DIGITAL DIAGNOSTIC  LEFT MAMMOGRAM WITH CAD  ULTRASOUND LEFT BREAST  COMPARISON:  Previous exams.  ACR Breast Density Category b: There are scattered areas of fibroglandular density.  FINDINGS: There is a high density mass in the outer left breast with associated distortion measuring up to 3.7 cm and confirmed on the additional spot compression tangential view. This corresponds to the site of palpable concern in the left breast. In addition, there are morphologically abnormal lymph nodes in the left axilla.  Mammographic images were processed with CAD.  Physical examination at site of palpable concern in the left breast reveals a firm mass at the approximate 12 to 1 o'clock position.  Targeted ultrasound of the left breast was performed demonstrating an irregular hypoechoic mass at 1 o'clock 2 cm from the nipple measuring 2.6 x 2.5 x 2.6 cm. This corresponds with mammography findings.  Enlarged and morphologically abnormal lymph nodes are seen in the left axilla, the largest of which measures 3 x 2.1 x 2 cm.  IMPRESSION: Highly suspicious left breast mass and left axillary lymphadenopathy.  RECOMMENDATION: Ultrasound-guided biopsy of the mass in the left breast as well as the enlarged left axillary lymph node is recommended. This is scheduled for Wednesday June  10th at 11:30 a.m.  I have discussed the findings and recommendations with the patient. Results were also provided in writing at the conclusion of the visit. If applicable, a reminder letter will be sent to the patient regarding the next appointment.  BI-RADS CATEGORY  5: Highly suggestive of malignancy.   Electronically Signed   By: Everlean Alstrom M.D.   On: 02/20/2014 13:42   Mm Digital Diagnostic Unilat L  02/22/2014   CLINICAL DATA:  Post biopsy of a highly suspicious left breast mass and left axillary lymph node.  EXAM: DIAGNOSTIC LEFT MAMMOGRAM POST ULTRASOUND BIOPSY  COMPARISON:  Previous exams  FINDINGS: Mammographic images were obtained following ultrasound guided biopsy of a highly suspicious mass in the upper left breast as well as of a highly suspicious left axillary lymph node. A ribbon shaped biopsy marking clip is present in the targeted region of the mass.  IMPRESSION: Appropriate positioning of ribbon shaped biopsy marking clip in the left breast post biopsy of a highly suspicious mass at 12 o'clock.  Final Assessment: Post Procedure Mammograms for Marker Placement   Electronically Signed   By: Everlean Alstrom M.D.   On: 02/22/2014 12:33   US Breast Ltd Uni Left Inc Axilla  02/20/2014   CLINICAL DATA:  63 year old female with a left  breast palpable abnormality. The patient had a previously seen probably benign asymmetry in the left breast which she failed to follow-up.  EXAM: DIGITAL DIAGNOSTIC  LEFT MAMMOGRAM WITH CAD  ULTRASOUND LEFT BREAST  COMPARISON:  Previous exams.  ACR Breast Density Category b: There are scattered areas of fibroglandular density.  FINDINGS: There is a high density mass in the outer left breast with associated distortion measuring up to 3.7 cm and confirmed on the additional spot compression tangential view. This corresponds to the site of palpable concern in the left breast. In addition, there are morphologically abnormal lymph nodes in the left axilla.   Mammographic images were processed with CAD.  Physical examination at site of palpable concern in the left breast reveals a firm mass at the approximate 12 to 1 o'clock position.  Targeted ultrasound of the left breast was performed demonstrating an irregular hypoechoic mass at 1 o'clock 2 cm from the nipple measuring 2.6 x 2.5 x 2.6 cm. This corresponds with mammography findings.  Enlarged and morphologically abnormal lymph nodes are seen in the left axilla, the largest of which measures 3 x 2.1 x 2 cm.  IMPRESSION: Highly suspicious left breast mass and left axillary lymphadenopathy.  RECOMMENDATION: Ultrasound-guided biopsy of the mass in the left breast as well as the enlarged left axillary lymph node is recommended. This is scheduled for Wednesday June 10th at 11:30 a.m.  I have discussed the findings and recommendations with the patient. Results were also provided in writing at the conclusion of the visit. If applicable, a reminder letter will be sent to the patient regarding the next appointment.  BI-RADS CATEGORY  5: Highly suggestive of malignancy.   Electronically Signed   By: Everlean Alstrom M.D.   On: 02/20/2014 13:42   [place] Lt Breast Bx W Loc Dev 1st Lesion Img Bx Spec US Guide  02/23/2014   ADDENDUM REPORT: 02/23/2014 16:25  ADDENDUM: THE PATHOLOGY ASSOCIATED WITH THE LEFT BREAST ULTRASOUND-GUIDED CORE BIOPSY DEMONSTRATED GRADE 3 INVASIVE DUCTAL CARCINOMA. THE PATHOLOGY ASSOCIATED WITH THE LEFT AXILLARY LYMPH NODE ULTRASOUND-GUIDED CORE BIOPSY DEMONSTRATED METASTATIC CARCINOMA WITHIN THE LYMPH NODE. THE PATHOLOGY IS CONCORDANT WITH THE IMAGING FINDINGS. I HAVE DISCUSSED FINDINGS WITH THE PATIENT BY TELEPHONE AND ANSWERED HER QUESTIONS. PATIENT STATES HER BIOPSY SITES ARE CLEAN AND DRY WITHOUT HEMATOMA FORMATION OR SIGNS OF INFECTION. THERE IS MILD TENDERNESS. THE PATIENT IS SCHEDULED FOR THE BREAST [place] ON 06/17 2015. THE PATIENT IS ALSO SCHEDULED FOR BREAST MRI ON  03/01/2014 AT 7:15 A.M. POST BIOPSY WOUND CARE INSTRUCTIONS WERE REVIEWED WITH THE PATIENT. THE PATIENT WAS ENCOURAGED TO CALL OUR BREAST CENTER FOR ADDITIONAL QUESTIONS OR CONCERNS.   Electronically Signed   By: Luberta Robertson M.D.   On: 02/23/2014 16:25   02/23/2014   CLINICAL DATA:  Highly suspicious left breast mass and left axillary lymph node.  EXAM: ULTRASOUND GUIDED LEFT BREAST CORE NEEDLE BIOPSY  COMPARISON:  Previous exams.  PROCEDURE: I met with the patient and we discussed the procedure of ultrasound-guided biopsy, including benefits and alternatives. We discussed the high likelihood of a successful procedure. We discussed the risks of the procedure including infection, bleeding, tissue injury, clip migration, and inadequate sampling. Informed written consent was given. The usual time-out protocol was performed immediately prior to the procedure.  1. Using sterile technique and 2% Lidocaine as local anesthetic, under direct ultrasound visualization, a 12 gauge vacuum-assisteddevice was used to perform biopsy of highly suspicious mass in the left breast at 12 o'clock  using a lateral approach. At the conclusion of the procedure, a ribbon shaped tissue marker clip was deployed into the biopsy cavity. Follow-up 2-view mammogram was performed and dictated separately.  2. Using sterile technique and 2% Lidocaine as local anesthetic, under direct ultrasound visualization, a 12 gauge vacuum-assisteddevice was used to perform biopsy of the enlarged and morphologically abnormal lymph node in the low left axilla using a lateral approach.  IMPRESSION: Ultrasound-guided biopsy of a highly suspicious mass in the left breast as well as of a highly suspicious left axillary lymph node. No apparent complications.  Electronically Signed: By: Everlean Alstrom M.D. On: 02/22/2014 12:09   [place] Lt Breast Bx W Loc Dev Ea Add Lesion Img Bx Spec US Guide  lly Signed: By: Everlean Alstrom M.D. On: 02/22/2014 12:09    ASSESSMENT: 63 y.o. [place] woman s/p breast and left axillary lymph node biopsy 02/22/2014, both positive for a clinical T2 N2, stage IIIA invasive ductal carcinoma, grade 3, triple negative, with an MIB-1 of 90%  (1) neoadjuvant chemotherapy started 03/13/2014, with cyclophosphamide and doxorubicin in dose dense fashion x4, with Neulasta support on day 2, to be followed by weekly carboplatin and paclitaxel x12  (2) definitive surgery to follow chemotherapy  (3) radiation to follow surgery  (4) remote history of partial colectomy for early stage colon Lopez incidentally found during TAH/BSO in the 1980s (in her 40's)  (5) genetics testing pending  (6) staging studies showed right-sided lung nodules, too small to characterize, and a dominant right-sided thyroid nodule unchanged in size as compared to 2011  PLAN:  Vaughan Basta tolerated her 2 doses of AC well. CBC reviewed with her today. She was neutropenic and took a course of ciprofloxacin. Recommended that she monitor her temperature and call us if she has a fever of 100.5 or higher. She was instructed to take Cipro 500 mg BID for 5 days. She states that she has Cipro at home already.  Her counts and physical exam is normal today. Mild anemia related to chemo. OK to proceed with Cycle 3 of AC.  Zofran and dexamethasone scripts were refilled.   She will return next week for assessment prior to her fourth cycle of AC.   The patient has a good understanding of the overall plan. She agrees with it. She knows the goal of treatment in her case is cure. She will call with any problems that may develop before her next visit here.   Bernadene Bell, MD Medical Hematologist/Oncologist [place] Pager: [phone] Office No: [phone]  04/10/2014 12:32 PM

## 2014-04-10 NOTE — Patient Instructions (Signed)

## 2014-04-11 ENCOUNTER — Ambulatory Visit (HOSPITAL_BASED_OUTPATIENT_CLINIC_OR_DEPARTMENT_OTHER): Payer: Medicare Other

## 2014-04-11 ENCOUNTER — Other Ambulatory Visit: Payer: Self-pay | Admitting: Oncology

## 2014-04-11 ENCOUNTER — Encounter: Payer: Self-pay | Admitting: Oncology

## 2014-04-11 VITALS — BP 145/87 | HR 80 | Temp 98.0°F

## 2014-04-11 DIAGNOSIS — C773 Secondary and unspecified malignant neoplasm of axilla and upper limb lymph nodes: Secondary | ICD-10-CM

## 2014-04-11 DIAGNOSIS — C50919 Malignant neoplasm of unspecified site of unspecified female breast: Secondary | ICD-10-CM | POA: Diagnosis not present

## 2014-04-11 DIAGNOSIS — Z5189 Encounter for other specified aftercare: Secondary | ICD-10-CM | POA: Diagnosis not present

## 2014-04-11 DIAGNOSIS — C50412 Malignant neoplasm of upper-outer quadrant of left female breast: Secondary | ICD-10-CM

## 2014-04-11 MED ORDER — PEGFILGRASTIM INJECTION 6 MG/0.6ML
6.0000 mg | Freq: Once | SUBCUTANEOUS | Status: AC
  Administered 2014-04-11: 6 mg via SUBCUTANEOUS
  Filled 2014-04-11: qty 0.6

## 2014-04-13 ENCOUNTER — Ambulatory Visit: Payer: Medicare Other | Admitting: Physical Therapy

## 2014-04-13 DIAGNOSIS — IMO0001 Reserved for inherently not codable concepts without codable children: Secondary | ICD-10-CM | POA: Diagnosis not present

## 2014-04-17 ENCOUNTER — Ambulatory Visit (HOSPITAL_BASED_OUTPATIENT_CLINIC_OR_DEPARTMENT_OTHER): Payer: Medicare Other

## 2014-04-17 ENCOUNTER — Ambulatory Visit: Payer: Medicare Other | Attending: General Surgery | Admitting: Physical Therapy

## 2014-04-17 ENCOUNTER — Other Ambulatory Visit (HOSPITAL_BASED_OUTPATIENT_CLINIC_OR_DEPARTMENT_OTHER): Payer: Medicare Other

## 2014-04-17 ENCOUNTER — Ambulatory Visit (HOSPITAL_BASED_OUTPATIENT_CLINIC_OR_DEPARTMENT_OTHER): Payer: Medicare Other | Admitting: Nurse Practitioner

## 2014-04-17 VITALS — BP 120/76 | HR 99 | Temp 98.8°F | Resp 18 | Ht 61.0 in | Wt 241.1 lb

## 2014-04-17 VITALS — BP 120/76 | HR 98 | Temp 98.8°F

## 2014-04-17 DIAGNOSIS — R29898 Other symptoms and signs involving the musculoskeletal system: Secondary | ICD-10-CM | POA: Insufficient documentation

## 2014-04-17 DIAGNOSIS — C773 Secondary and unspecified malignant neoplasm of axilla and upper limb lymph nodes: Secondary | ICD-10-CM | POA: Diagnosis not present

## 2014-04-17 DIAGNOSIS — C50919 Malignant neoplasm of unspecified site of unspecified female breast: Secondary | ICD-10-CM

## 2014-04-17 DIAGNOSIS — D702 Other drug-induced agranulocytosis: Secondary | ICD-10-CM | POA: Diagnosis not present

## 2014-04-17 DIAGNOSIS — M25519 Pain in unspecified shoulder: Secondary | ICD-10-CM | POA: Diagnosis not present

## 2014-04-17 DIAGNOSIS — Z803 Family history of malignant neoplasm of breast: Secondary | ICD-10-CM | POA: Diagnosis not present

## 2014-04-17 DIAGNOSIS — Z95828 Presence of other vascular implants and grafts: Secondary | ICD-10-CM

## 2014-04-17 DIAGNOSIS — C50412 Malignant neoplasm of upper-outer quadrant of left female breast: Secondary | ICD-10-CM

## 2014-04-17 DIAGNOSIS — M24519 Contracture, unspecified shoulder: Secondary | ICD-10-CM | POA: Insufficient documentation

## 2014-04-17 DIAGNOSIS — T451X5A Adverse effect of antineoplastic and immunosuppressive drugs, initial encounter: Secondary | ICD-10-CM | POA: Diagnosis not present

## 2014-04-17 LAB — COMPREHENSIVE METABOLIC PANEL (CC13)
ALT: 18 U/L (ref 0–55)
AST: 9 U/L (ref 5–34)
Albumin: 3.2 g/dL — ABNORMAL LOW (ref 3.5–5.0)
Alkaline Phosphatase: 121 U/L (ref 40–150)
Anion Gap: 7 mEq/L (ref 3–11)
BILIRUBIN TOTAL: 0.67 mg/dL (ref 0.20–1.20)
BUN: 12.2 mg/dL (ref 7.0–26.0)
CO2: 28 mEq/L (ref 22–29)
CREATININE: 0.7 mg/dL (ref 0.6–1.1)
Calcium: 9.2 mg/dL (ref 8.4–10.4)
Chloride: 106 mEq/L (ref 98–109)
Glucose: 101 mg/dl (ref 70–140)
Potassium: 3.6 mEq/L (ref 3.5–5.1)
Sodium: 141 mEq/L (ref 136–145)
Total Protein: 6.4 g/dL (ref 6.4–8.3)

## 2014-04-17 LAB — CBC WITH DIFFERENTIAL/PLATELET
BASO%: 3.1 % — ABNORMAL HIGH (ref 0.0–2.0)
BASOS ABS: 0 10*3/uL (ref 0.0–0.1)
EOS ABS: 0 10*3/uL (ref 0.0–0.5)
EOS%: 0 % (ref 0.0–7.0)
HCT: 32.1 % — ABNORMAL LOW (ref 34.8–46.6)
HGB: 10.7 g/dL — ABNORMAL LOW (ref 11.6–15.9)
LYMPH%: 40 % (ref 14.0–49.7)
MCH: 28.8 pg (ref 25.1–34.0)
MCHC: 33.3 g/dL (ref 31.5–36.0)
MCV: 86.5 fL (ref 79.5–101.0)
MONO#: 0.1 10*3/uL (ref 0.1–0.9)
MONO%: 12.3 % (ref 0.0–14.0)
NEUT#: 0.3 10*3/uL — CL (ref 1.5–6.5)
NEUT%: 44.6 % (ref 38.4–76.8)
Platelets: 203 10*3/uL (ref 145–400)
RBC: 3.71 10*6/uL (ref 3.70–5.45)
RDW: 13.7 % (ref 11.2–14.5)
WBC: 0.7 10*3/uL — AB (ref 3.9–10.3)
lymph#: 0.3 10*3/uL — ABNORMAL LOW (ref 0.9–3.3)
nRBC: 0 % (ref 0–0)

## 2014-04-17 MED ORDER — HEPARIN SOD (PORK) LOCK FLUSH 100 UNIT/ML IV SOLN
500.0000 [IU] | Freq: Once | INTRAVENOUS | Status: AC
  Administered 2014-04-17: 500 [IU] via INTRAVENOUS
  Filled 2014-04-17: qty 5

## 2014-04-17 MED ORDER — SODIUM CHLORIDE 0.9 % IJ SOLN
10.0000 mL | INTRAMUSCULAR | Status: DC | PRN
  Administered 2014-04-17: 10 mL via INTRAVENOUS
  Filled 2014-04-17: qty 10

## 2014-04-17 NOTE — Progress Notes (Addendum)
[name]  [phone] Fax:([phone]) [phone]     ID: [name] DOB: [birth year]  MR#: [id number]  JME#:[id number]  PCP: Maximino Greenland, MD GYN: SU: Rolm Bookbinder OTHER MD: Gery Pray  CHIEF COMPLAINT: Stage III breast cancer, triple negative   CURRENT TREATMENT: Neoadjuvant chemotherapy  BREAST CANCER HISTORY: From the original intake note:  [name] herself palpated a mass in her left breast, and immediately brought it to the attention of her primary care physician, Dr. Baird Cancer, who confirmed the finding and setup the patient for bilateral diagnostic mammography at the breast Center 02/20/2014. This showed a high density mass in the outer left breast measuring up to 3.7 cm. It corresponded to the site of palpable concern. In addition the normal 4 logically abnormal lymph nodes in the left axilla. The mass was palpable by exam, and by ultrasonography measured 2.6 cm. There were enlarged and morphologically abnormal lymph nodes in the left axilla, largest measuring 3 cm.  Biopsy of the left breast mass in question and 1 of the abnormal axillary lymph nodes 02/22/2014 showed ([id number]) biopsies to be positive for an invasive ductal carcinoma, grade 3, triple negative, with an MIB-1 of 90%. Specifically the HER-2 signals ratio was 1.05, and the number per cell 2.00.  MRI of the breasts 03/01/2014 showed a 3.8 cm enhancing mass with areas of apparent necrosis in the left breast, as well as the biopsy tract extending laterally, the combined measure 5.3 cm. There were no other masses or areas of abnormal enhancement. There were multiple abnormally enlarged left axillary lymph nodes with loss of the normal fatty hilum. These included level I and retropectoral lymph nodes.  The patient's subsequent history is as detailed below  INTERVAL HISTORY: [name] returns today for followup of her breast cancer. Today is day 8 cycle 3 of 4 planned cycles of doxorubicin  and cyclophosphamide given every 2 weeks with Neulasta support on day 2. This will be followed by weekly carboplatin and paclitaxel x12  REVIEW OF SYSTEMS: [name] has little to offer in the way of complaints. She thinks chemo is going well. She is fatigued on days 2 and 3, but picks up activity wise on day 4. She denies fever, chills, nausea, vomiting, or change in bowel or bladder habits. She experiences no pain from the neulasta shot. Her eyes have been watery, necessitating tissues handy to wipe the tears that escape the duct. This is bothersome to her, but she is coping well. Her vision becomes blurred occasionally because of this. She can feel a sty developing on her left eye lid but is managing this with warm compresses PRN. She stated the sty on her right eyelid went away on its on with this treatment. Her appetite is the same, but she does note altered taste sensations. A detailed review of systems was otherwise negative.    PAST MEDICAL HISTORY: Past Medical History  Diagnosis Date  . Colon cancer   . Hx of rotator cuff surgery     PAST SURGICAL HISTORY: Past Surgical History  Procedure Laterality Date  . Abdominal hysterectomy    . Colon surgery      colectomy/colostomy with takedown    FAMILY HISTORY Family History  Problem Relation Age of Onset  . Cancer Mother 55    breast  . Cancer Maternal Aunt     breast cancer at unknown age   the patient's father died at the age of 56 following a stroke in the setting of diabetes;  the patient's mother is living at 23. The patient has 3 brothers and 2 sisters. [name], [name], was diagnosed with breast cancer in her early 63s. There is no other breast or ovarian cancer in the family. The patient herself was diagnosed with watts by history he is an incidentally found stage I colon carcinoma noted at the time of her hysterectomy. She had a partial colectomy but no adjuvant treatment. We have no records regarding that  procedure  GYNECOLOGIC HISTORY:  No LMP recorded. Patient has had a hysterectomy. Menarche age 28, first live birth age 24, the patient underwent total abdominal hysterectomy with bilateral salpingo-oophorectomy in her 85s. She did not take hormone replacement. She did not take oral contraceptives at any point.  SOCIAL HISTORY:  [name] work for [employer] for about 40 years, retiring in 2008. She is divorced and lives alone, with no pets. Her daughter [name] works for [employer] in Press [employer]. The second daughter, [name], works as a Research scientist (physical sciences) for [employer]. The patient has 6 grandchildren and one great-grandchild. She attends a [employer].    ADVANCED DIRECTIVES: Not in place. The patient intends to name her daughter [name]. her healthcare power of attorney. [name] is work number is [id number]. On the patient's 03/01/2014 visit she was given a copy of the appropriate documents to complete and notarize at her discretion   HEALTH MAINTENANCE: History  Substance Use Topics  . Smoking status: Never Smoker   . Smokeless tobacco: Not on file  . Alcohol Use: No     Colonoscopy: 2000  PAP: May 2015  Bone density: 05/23/2004, at [facility]; normal  Lipid panel:  No Known Allergies  Current Outpatient Prescriptions  Medication Sig Dispense Refill  . LORazepam (ATIVAN) 0.5 MG tablet Take 1 tablet (0.5 mg total) by mouth every 6 (six) hours as needed (Nausea or vomiting).  30 tablet  0  . acetaminophen (TYLENOL) 500 MG tablet Take 1,000 mg by mouth every 6 (six) hours as needed for mild pain or moderate pain.      . ciprofloxacin (CIPRO) 500 MG tablet Take 1 tablet (500 mg total) by mouth 2 (two) times daily.  10 tablet  0  . dexamethasone (DECADRON) 4 MG tablet Take 2 tablets by mouth once a day on the day after chemotherapy and then take 2 tablets two times a day for 2 days. Take with food.  30 tablet  1  . lidocaine-prilocaine (EMLA) cream Apply 1 application  topically once. Apply to port 1-2 hours prior to access.  30 g  1  . ondansetron (ZOFRAN) 8 MG tablet Take 1 tablet (8 mg total) by mouth 2 (two) times daily as needed. Start on the third day after chemotherapy.  30 tablet  0  . prochlorperazine (COMPAZINE) 10 MG tablet Take 1 tablet (10 mg total) by mouth every 6 (six) hours as needed (Nausea or vomiting).  30 tablet  1   No current facility-administered medications for this visit.    OBJECTIVE: Middle-aged [place] American woman who appears stated age 63 Vitals:   04/17/14 1048  BP: 120/76  Pulse: 99  Temp: 98.8 F (37.1 C)  Resp: 18     Body mass index is 45.58 kg/(m^2).    ECOG FS:0 - Asymptomatic  Skin: warm, dry, hyperpigmentation on bilateral hands HEENT: sclerae anicteric and watery, conjunctivae pink, oropharynx clear. No thrush or mucositis.  Lymph Nodes: No cervical or supraclavicular lymphadenopathy  Lungs: clear to auscultation bilaterally,  no rales, wheezes, or rhonci  Heart: regular rate and rhythm  Abdomen: round, soft, non tender, positive bowel sounds  Musculoskeletal: No focal spinal tenderness, no peripheral edema  Neuro: non focal, well oriented, positive affect  Breast: deferred    LAB RESULTS:  I No results found for this basename: SPEP,  UPEP,   kappa and lambda light chains    Lab Results  Component Value Date   WBC 0.7* 04/17/2014   NEUTROABS 0.3* 04/17/2014   HGB 10.7* 04/17/2014   HCT 32.1* 04/17/2014   MCV 86.5 04/17/2014   PLT 203 04/17/2014      Chemistry      Component Value Date/Time   NA 141 04/17/2014 1030   NA 142 04/13/2010 0403   K 3.6 04/17/2014 1030   K 3.4* 04/13/2010 0403   CL 107 04/13/2010 0403   CO2 28 04/17/2014 1030   CO2 29 04/13/2010 0403   BUN 12.2 04/17/2014 1030   BUN 5* 04/13/2010 0403   CREATININE 0.7 04/17/2014 1030   CREATININE 0.81 04/13/2010 0403      Component Value Date/Time   CALCIUM 9.2 04/17/2014 1030   CALCIUM 8.4 04/13/2010 0403   ALKPHOS 121 04/17/2014 1030   ALKPHOS  103 04/05/2008 1120   AST 9 04/17/2014 1030   AST 16 04/05/2008 1120   ALT 18 04/17/2014 1030   ALT 14 04/05/2008 1120   BILITOT 0.67 04/17/2014 1030   BILITOT 1.2 04/05/2008 1120       No results found for this basename: LABCA2    No components found with this basename: [id number]    No results found for this basename: INR,  in the last 168 hours  Urinalysis    Component Value Date/Time   COLORURINE YELLOW 04/05/2008 1120   APPEARANCEUR CLEAR 04/05/2008 1120   LABSPEC 1.021 04/05/2008 1120   PHURINE 6.0 04/05/2008 1120   GLUCOSEU NEGATIVE 04/05/2008 1120   HGBUR NEGATIVE 04/05/2008 1120   BILIRUBINUR NEGATIVE 04/05/2008 1120   KETONESUR NEGATIVE 04/05/2008 1120   PROTEINUR NEGATIVE 04/05/2008 1120   UROBILINOGEN 0.2 04/05/2008 1120   NITRITE NEGATIVE 04/05/2008 1120   LEUKOCYTESUR NEGATIVE MICROSCOPIC NOT DONE ON URINES WITH NEGATIVE PROTEIN, BLOOD, LEUKOCYTES, NITRITE, OR GLUCOSE <1000 mg/dL. 04/05/2008 1120    STUDIES: No results found.  ASSESSMENT: 63 y.o. [place] woman s/p breast and left axillary lymph node biopsy 02/22/2014, both positive for a clinical T2 N2, stage IIIA invasive ductal carcinoma, grade 3, triple negative, with an MIB-1 of 90%  (1) neoadjuvant chemotherapy started 03/13/2014, with cyclophosphamide and doxorubicin in dose dense fashion x4, with Neulasta support on day 2, to be followed by weekly carboplatin and paclitaxel x12  (2) definitive surgery to follow chemotherapy  (3) radiation to follow surgery  (4) remote history of partial colectomy for early stage colon cancer incidentally found during TAH/BSO in the 1980s  (5) genetics testing since 04/03/2014, results pending  (6) staging studies showed right-sided lung nodules, too small to characterize, and a dominant right-sided thyroid nodule unchanged in size as compared to 2011; to be followed  PLAN: Marajade is tolerating chemotherapy very well. The labs were reviewed with the patient and she is remarkably  neutropenic. She has been advised to resume 555m cipro BID x 5 days. She still has enough supply from her previous prescription. Otherwise, there are no issues to resolve. She will return to clinic next week for follow up labs, an office visit, and chemo if her counts improve. This will be her fourth  and final dose of AC. She will visit Dr. Jana Hakim the following week to discuss her next chemo regimen, carboplatin and paclitaxel.   Ladasha understands and agrees with the plan. She has been encouraged to call with any issues that may arise before her next visit.   Marcelino Duster, NP   04/17/2014 11:59 AM   ADDENDUM: Danna is doing well with her chemotherapy except for the neutropenia. Even though her neutrophil count is so low, she has not had a fever, and that we are not dropping the dose. Instead we are covering with Cipro for the next 5 days. She knows it is important to maintain neutropenic precautions during this time and she will call with any temperature above 100.  Otherwise she will return in one week to receive her final cycle of cyclophosphamide and doxorubicin, and then see me 2 weeks from now to discuss the rest of her chemotherapy, which will consist of carboplatin and paclitaxel weekly x12.  I personally saw this patient and performed a substantive portion of this encounter with the listed APP documented above.   Chauncey Cruel, MD

## 2014-04-17 NOTE — Patient Instructions (Signed)

## 2014-04-21 ENCOUNTER — Ambulatory Visit: Payer: Medicare Other | Admitting: Physical Therapy

## 2014-04-21 DIAGNOSIS — M25519 Pain in unspecified shoulder: Secondary | ICD-10-CM | POA: Diagnosis not present

## 2014-04-24 ENCOUNTER — Ambulatory Visit (HOSPITAL_BASED_OUTPATIENT_CLINIC_OR_DEPARTMENT_OTHER): Payer: Medicare Other | Admitting: Nurse Practitioner

## 2014-04-24 ENCOUNTER — Other Ambulatory Visit (HOSPITAL_BASED_OUTPATIENT_CLINIC_OR_DEPARTMENT_OTHER): Payer: Medicare Other

## 2014-04-24 ENCOUNTER — Ambulatory Visit (HOSPITAL_BASED_OUTPATIENT_CLINIC_OR_DEPARTMENT_OTHER): Payer: Medicare Other

## 2014-04-24 ENCOUNTER — Ambulatory Visit: Payer: Medicare Other

## 2014-04-24 ENCOUNTER — Encounter: Payer: Self-pay | Admitting: General Practice

## 2014-04-24 VITALS — BP 141/88 | HR 81 | Temp 98.8°F | Resp 20 | Ht 61.0 in | Wt 243.5 lb

## 2014-04-24 DIAGNOSIS — C50412 Malignant neoplasm of upper-outer quadrant of left female breast: Secondary | ICD-10-CM

## 2014-04-24 DIAGNOSIS — C50919 Malignant neoplasm of unspecified site of unspecified female breast: Secondary | ICD-10-CM

## 2014-04-24 DIAGNOSIS — Z171 Estrogen receptor negative status [ER-]: Secondary | ICD-10-CM

## 2014-04-24 DIAGNOSIS — C50419 Malignant neoplasm of upper-outer quadrant of unspecified female breast: Secondary | ICD-10-CM | POA: Diagnosis not present

## 2014-04-24 DIAGNOSIS — C773 Secondary and unspecified malignant neoplasm of axilla and upper limb lymph nodes: Secondary | ICD-10-CM

## 2014-04-24 DIAGNOSIS — Z95828 Presence of other vascular implants and grafts: Secondary | ICD-10-CM

## 2014-04-24 DIAGNOSIS — Z5111 Encounter for antineoplastic chemotherapy: Secondary | ICD-10-CM

## 2014-04-24 LAB — COMPREHENSIVE METABOLIC PANEL
ALT: 25 U/L (ref 0–35)
AST: 13 U/L (ref 0–37)
Albumin: 3.3 g/dL — ABNORMAL LOW (ref 3.5–5.2)
Alkaline Phosphatase: 121 U/L — ABNORMAL HIGH (ref 39–117)
BUN: 12 mg/dL (ref 6–23)
CHLORIDE: 105 meq/L (ref 96–112)
CO2: 25 mEq/L (ref 19–32)
CREATININE: 0.93 mg/dL (ref 0.50–1.10)
Calcium: 9.3 mg/dL (ref 8.4–10.5)
GLUCOSE: 95 mg/dL (ref 70–99)
Potassium: 3.5 mEq/L (ref 3.5–5.3)
Sodium: 143 mEq/L (ref 135–145)
Total Bilirubin: 0.3 mg/dL (ref 0.2–1.2)
Total Protein: 6.6 g/dL (ref 6.0–8.3)

## 2014-04-24 LAB — CBC WITH DIFFERENTIAL/PLATELET
BASO%: 0.6 % (ref 0.0–2.0)
Basophils Absolute: 0.1 10*3/uL (ref 0.0–0.1)
EOS ABS: 0 10*3/uL (ref 0.0–0.5)
EOS%: 0.1 % (ref 0.0–7.0)
HEMATOCRIT: 32 % — AB (ref 34.8–46.6)
HEMOGLOBIN: 10.4 g/dL — AB (ref 11.6–15.9)
LYMPH%: 9.5 % — ABNORMAL LOW (ref 14.0–49.7)
MCH: 29.1 pg (ref 25.1–34.0)
MCHC: 32.5 g/dL (ref 31.5–36.0)
MCV: 89.4 fL (ref 79.5–101.0)
MONO#: 0.6 10*3/uL (ref 0.1–0.9)
MONO%: 7.4 % (ref 0.0–14.0)
NEUT%: 82.4 % — AB (ref 38.4–76.8)
NEUTROS ABS: 6.8 10*3/uL — AB (ref 1.5–6.5)
Platelets: 166 10*3/uL (ref 145–400)
RBC: 3.58 10*6/uL — ABNORMAL LOW (ref 3.70–5.45)
RDW: 15.1 % — ABNORMAL HIGH (ref 11.2–14.5)
WBC: 8.2 10*3/uL (ref 3.9–10.3)
lymph#: 0.8 10*3/uL — ABNORMAL LOW (ref 0.9–3.3)

## 2014-04-24 MED ORDER — DEXAMETHASONE SODIUM PHOSPHATE 20 MG/5ML IJ SOLN
12.0000 mg | Freq: Once | INTRAMUSCULAR | Status: AC
  Administered 2014-04-24: 12 mg via INTRAVENOUS

## 2014-04-24 MED ORDER — SODIUM CHLORIDE 0.9 % IJ SOLN
10.0000 mL | INTRAMUSCULAR | Status: DC | PRN
  Administered 2014-04-24: 10 mL
  Filled 2014-04-24: qty 10

## 2014-04-24 MED ORDER — SODIUM CHLORIDE 0.9 % IV SOLN
Freq: Once | INTRAVENOUS | Status: AC
  Administered 2014-04-24: 12:00:00 via INTRAVENOUS

## 2014-04-24 MED ORDER — PALONOSETRON HCL INJECTION 0.25 MG/5ML
0.2500 mg | Freq: Once | INTRAVENOUS | Status: AC
  Administered 2014-04-24: 0.25 mg via INTRAVENOUS

## 2014-04-24 MED ORDER — PALONOSETRON HCL INJECTION 0.25 MG/5ML
INTRAVENOUS | Status: AC
  Filled 2014-04-24: qty 5

## 2014-04-24 MED ORDER — SODIUM CHLORIDE 0.9 % IV SOLN
150.0000 mg | Freq: Once | INTRAVENOUS | Status: AC
  Administered 2014-04-24: 150 mg via INTRAVENOUS
  Filled 2014-04-24: qty 5

## 2014-04-24 MED ORDER — DEXAMETHASONE SODIUM PHOSPHATE 20 MG/5ML IJ SOLN
INTRAMUSCULAR | Status: AC
  Filled 2014-04-24: qty 5

## 2014-04-24 MED ORDER — DOXORUBICIN HCL CHEMO IV INJECTION 2 MG/ML
60.0000 mg/m2 | Freq: Once | INTRAVENOUS | Status: AC
  Administered 2014-04-24: 132 mg via INTRAVENOUS
  Filled 2014-04-24: qty 66

## 2014-04-24 MED ORDER — HEPARIN SOD (PORK) LOCK FLUSH 100 UNIT/ML IV SOLN
500.0000 [IU] | Freq: Once | INTRAVENOUS | Status: AC | PRN
  Administered 2014-04-24: 500 [IU]
  Filled 2014-04-24: qty 5

## 2014-04-24 MED ORDER — SODIUM CHLORIDE 0.9 % IJ SOLN
10.0000 mL | INTRAMUSCULAR | Status: DC | PRN
  Administered 2014-04-24: 10 mL via INTRAVENOUS
  Filled 2014-04-24: qty 10

## 2014-04-24 MED ORDER — SODIUM CHLORIDE 0.9 % IV SOLN
600.0000 mg/m2 | Freq: Once | INTRAVENOUS | Status: AC
  Administered 2014-04-24: 1320 mg via INTRAVENOUS
  Filled 2014-04-24: qty 66

## 2014-04-24 NOTE — Progress Notes (Signed)
Pt saw Susanne Borders, NP today prior to chemo.  OK to proceed with chemo as planned with all lab results today.

## 2014-04-24 NOTE — Progress Notes (Signed)
Visited with [name] and her mom Alden Benjamin, herself a breast cancer survivor, in lobby, providing pastoral presence, reflective listening, encouragement, and information about Spiritual Care and upcoming book studies, as pt loves to read.  Family very appreciative to know of ongoing chaplain availability as desired.  [place], [place]

## 2014-04-24 NOTE — Progress Notes (Signed)
[place]  Telephone:([phone] Fax:([phone]) [phone]     ID: [name] DOB: 1951/04/14  MR#: [id number]  SFK#:[id number]  PCP: Maximino Greenland, MD GYN: SU: Rolm Bookbinder OTHER MD: Gery Pray  CHIEF COMPLAINT: Stage III breast cancer, triple negative   CURRENT TREATMENT: Neoadjuvant chemotherapy  BREAST CANCER HISTORY: From the original intake note:  [name] herself palpated a mass in her left breast, and immediately brought it to the attention of her primary care physician, Dr. Baird Cancer, who confirmed the finding and setup the patient for bilateral diagnostic mammography at the [facility] Center 02/20/2014. This showed a high density mass in the outer left breast measuring up to 3.7 cm. It corresponded to the site of palpable concern. In addition the normal 4 logically abnormal lymph nodes in the left axilla. The mass was palpable by exam, and by ultrasonography measured 2.6 cm. There were enlarged and morphologically abnormal lymph nodes in the left axilla, largest measuring 3 cm.  Biopsy of the left breast mass in question and 1 of the abnormal axillary lymph nodes 02/22/2014 showed ([id number]) biopsies to be positive for an invasive ductal carcinoma, grade 3, triple negative, with an MIB-1 of 90%. Specifically the HER-2 signals ratio was 1.05, and the number per cell 2.00.  MRI of the breasts 03/01/2014 showed a 3.8 cm enhancing mass with areas of apparent necrosis in the left breast, as well as the biopsy tract extending laterally, the combined measure 5.3 cm. There were no other masses or areas of abnormal enhancement. There were multiple abnormally enlarged left axillary lymph nodes with loss of the normal fatty hilum. These included level I and retropectoral lymph nodes.  The patient's subsequent history is as detailed below  INTERVAL HISTORY: [name] returns today for followup of her breast cancer. Today is day 1 cycle 4 of 4 planned cycles of doxorubicin  and cyclophosphamide given every 2 weeks with Neulasta support on day 2. This will be followed by weekly carboplatin and paclitaxel x12.  REVIEW OF SYSTEMS: [name] is doing well with her chemo. Her fatigue occurs a day or 2 after chemo but quickly resolves. She denies fever, chills, nausea, vomiting, bowel changes, and peripheral neuropathy. Her eyes were previously watery, causing her to have to wipe away tears often but this is alleviated by eyedrops. The stye on her left eyelid has resolved with warm compresses. Her appetite has not changed, though her taste sensation has. A detailed review of systems was otherwise negative.   PAST MEDICAL HISTORY: Past Medical History  Diagnosis Date  . Colon cancer   . Hx of rotator cuff surgery     PAST SURGICAL HISTORY: Past Surgical History  Procedure Laterality Date  . Abdominal hysterectomy    . Colon surgery      colectomy/colostomy with takedown    FAMILY HISTORY Family History  Problem Relation Age of Onset  . Cancer Mother 68    breast  . Cancer Maternal Aunt     breast cancer at unknown age   the patient's father died at the age of 68 following a stroke in the setting of diabetes; the patient's mother is living at 30. The patient has 3 brothers and 2 sisters. [name], [name], was diagnosed with breast cancer in her early 31s. There is no other breast or ovarian cancer in the family. The patient herself was diagnosed with watts by history he is an incidentally found stage I colon carcinoma noted at the time of her  hysterectomy. She had a partial colectomy but no adjuvant treatment. We have no records regarding that procedure  GYNECOLOGIC HISTORY:  No LMP recorded. Patient has had a hysterectomy. Menarche age 69, first live birth age 3, the patient underwent total abdominal hysterectomy with bilateral salpingo-oophorectomy in her 37s. She did not take hormone replacement. She did not take oral contraceptives at any  point.  SOCIAL HISTORY:  [name] work for [employer] for about 40 years, retiring in 2008. She is divorced and lives alone, with no pets. Her daughter [name] works for [employer] in [employer]. The second daughter, [name], works as a Research scientist (physical sciences) for [employer]. The patient has 6 grandchildren and one great-grandchild. She attends a [employer].    ADVANCED DIRECTIVES: Not in place. The patient intends to name her daughter [name]. [name] is work number is [id number]. On the patient's 03/01/2014 visit she was given a copy of the appropriate documents to complete and notarize at her discretion   HEALTH MAINTENANCE: History  Substance Use Topics  . Smoking status: Never Smoker   . Smokeless tobacco: Not on file  . Alcohol Use: No     Colonoscopy: 2000  PAP: May 2015  Bone density: 05/23/2004, at [facility] hospital; normal  Lipid panel:  No Known Allergies  Current Outpatient Prescriptions  Medication Sig Dispense Refill  . dexamethasone (DECADRON) 4 MG tablet Take 2 tablets by mouth once a day on the day after chemotherapy and then take 2 tablets two times a day for 2 days. Take with food.  30 tablet  1  . lidocaine-prilocaine (EMLA) cream Apply 1 application topically once. Apply to port 1-2 hours prior to access.  30 g  1  . LORazepam (ATIVAN) 0.5 MG tablet Take 1 tablet (0.5 mg total) by mouth every 6 (six) hours as needed (Nausea or vomiting).  30 tablet  0  . ondansetron (ZOFRAN) 8 MG tablet Take 1 tablet (8 mg total) by mouth 2 (two) times daily as needed. Start on the third day after chemotherapy.  30 tablet  0  . acetaminophen (TYLENOL) 500 MG tablet Take 1,000 mg by mouth every 6 (six) hours as needed for mild pain or moderate pain.      . ciprofloxacin (CIPRO) 500 MG tablet Take 1 tablet (500 mg total) by mouth 2 (two) times daily.  10 tablet  0  . prochlorperazine (COMPAZINE) 10 MG tablet Take 1 tablet (10 mg total) by mouth  every 6 (six) hours as needed (Nausea or vomiting).  30 tablet  1   No current facility-administered medications for this visit.   Facility-Administered Medications Ordered in Other Visits  Medication Dose Route Frequency Provider Last Rate Last Dose  . sodium chloride 0.9 % injection 10 mL  10 mL Intracatheter PRN Chauncey Cruel, MD   10 mL at 04/24/14 1425    OBJECTIVE: Middle-aged [place] American woman who appears stated age 16 Vitals:   04/24/14 1053  BP: 141/88  Pulse: 81  Temp: 98.8 F (37.1 C)  Resp: 20     Body mass index is 46.03 kg/(m^2).    ECOG FS:0 - Asymptomatic  Skin warm dry, palms hyperpigmented Sclerae anicteric and watery, pupils equal and reactive Oropharynx clear and moist-- no thrush No cervical or supraclavicular adenopathy Lungs no rales or rhonchi Heart regular rate and rhythm Abd soft, nontender, positive bowel sounds MSK no focal spinal tenderness, no upper extremity lymphedema Neuro: nonfocal, well oriented, appropriate affect  Breasts: deferred  LAB RESULTS:  I No results found for this basename: SPEP,  UPEP,   kappa and lambda light chains    Lab Results  Component Value Date   WBC 8.2 04/24/2014   NEUTROABS 6.8* 04/24/2014   HGB 10.4* 04/24/2014   HCT 32.0* 04/24/2014   MCV 89.4 04/24/2014   PLT 166 04/24/2014      Chemistry      Component Value Date/Time   NA 143 04/24/2014 0958   NA 141 04/17/2014 1030   K 3.5 04/24/2014 0958   K 3.6 04/17/2014 1030   CL 105 04/24/2014 0958   CO2 25 04/24/2014 0958   CO2 28 04/17/2014 1030   BUN 12 04/24/2014 0958   BUN 12.2 04/17/2014 1030   CREATININE 0.93 04/24/2014 0958   CREATININE 0.7 04/17/2014 1030      Component Value Date/Time   CALCIUM 9.3 04/24/2014 0958   CALCIUM 9.2 04/17/2014 1030   ALKPHOS 121* 04/24/2014 0958   ALKPHOS 121 04/17/2014 1030   AST 13 04/24/2014 0958   AST 9 04/17/2014 1030   ALT 25 04/24/2014 0958   ALT 18 04/17/2014 1030   BILITOT 0.3 04/24/2014 0958   BILITOT 0.67 04/17/2014  1030       No results found for this basename: LABCA2    No components found with this basename: LABCA125    No results found for this basename: INR,  in the last 168 hours  Urinalysis    Component Value Date/Time   COLORURINE YELLOW 04/05/2008 1120   APPEARANCEUR CLEAR 04/05/2008 1120   LABSPEC 1.021 04/05/2008 1120   PHURINE 6.0 04/05/2008 1120   GLUCOSEU NEGATIVE 04/05/2008 1120   HGBUR NEGATIVE 04/05/2008 1120   BILIRUBINUR NEGATIVE 04/05/2008 1120   KETONESUR NEGATIVE 04/05/2008 1120   PROTEINUR NEGATIVE 04/05/2008 1120   UROBILINOGEN 0.2 04/05/2008 1120   NITRITE NEGATIVE 04/05/2008 1120   LEUKOCYTESUR NEGATIVE MICROSCOPIC NOT DONE ON URINES WITH NEGATIVE PROTEIN, BLOOD, LEUKOCYTES, NITRITE, OR GLUCOSE <1000 mg/dL. 04/05/2008 1120    STUDIES: No results found.  ASSESSMENT: 63 y.o. [place] woman s/p breast and left axillary lymph node biopsy 02/22/2014, both positive for a clinical T2 N2, stage IIIA invasive ductal carcinoma, grade 3, triple negative, with an MIB-1 of 90%  (1) neoadjuvant chemotherapy started 03/13/2014, with cyclophosphamide and doxorubicin in dose dense fashion x4, with Neulasta support on day 2, to be followed by weekly carboplatin and paclitaxel x12  (2) definitive surgery to follow chemotherapy  (3) radiation to follow surgery  (4) remote history of partial colectomy for early stage colon cancer incidentally found during [facility] in the 1980s  (5) genetics testing since 04/03/2014, results pending  (6) staging studies showed right-sided lung nodules, too small to characterize, and a dominant right-sided thyroid nodule unchanged in size as compared to 2011; to be followed  PLAN: Izabelle is tolerating the chemotherapy exceptionally well with no complaints. The labs were reviewed together with the patient and were normal. Her neutropenia has resolved. She was advised to stop the cipro. We will proceeded with the fourth and final dose of AC today. She will  visit Dr. Jana Hakim for labs and an office visit to discuss her next regimen, carboplatin/paclitaxel weekly x12.   Jeraldean understands and agrees with the plan. She has been encouraged to call with any issues that may arise before her next visit.   Marcelino Duster, NP   04/24/2014 3:19 PM

## 2014-04-24 NOTE — Patient Instructions (Signed)

## 2014-04-25 ENCOUNTER — Encounter: Payer: Medicare Other | Admitting: Physical Therapy

## 2014-04-25 ENCOUNTER — Ambulatory Visit (HOSPITAL_BASED_OUTPATIENT_CLINIC_OR_DEPARTMENT_OTHER): Payer: Medicare Other

## 2014-04-25 VITALS — BP 133/84 | HR 95 | Temp 98.1°F

## 2014-04-25 DIAGNOSIS — Z5189 Encounter for other specified aftercare: Secondary | ICD-10-CM | POA: Diagnosis not present

## 2014-04-25 DIAGNOSIS — C50412 Malignant neoplasm of upper-outer quadrant of left female breast: Secondary | ICD-10-CM

## 2014-04-25 DIAGNOSIS — C50919 Malignant neoplasm of unspecified site of unspecified female breast: Secondary | ICD-10-CM | POA: Diagnosis not present

## 2014-04-25 MED ORDER — PEGFILGRASTIM INJECTION 6 MG/0.6ML
6.0000 mg | Freq: Once | SUBCUTANEOUS | Status: AC
  Administered 2014-04-25: 6 mg via SUBCUTANEOUS
  Filled 2014-04-25: qty 0.6

## 2014-04-25 NOTE — Patient Instructions (Signed)
Pegfilgrastim injection What is this medicine? PEGFILGRASTIM (peg fil GRA stim) is a long-acting granulocyte colony-stimulating factor that stimulates the growth of neutrophils, a type of white blood cell important in the body's fight against infection. It is used to reduce the incidence of fever and infection in patients with certain types of cancer who are receiving chemotherapy that affects the bone marrow. This medicine may be used for other purposes; ask your health care provider or pharmacist if you have questions. COMMON BRAND NAME(S): Neulasta What should I tell my health care provider before I take this medicine? They need to know if you have any of these conditions: -latex allergy -ongoing radiation therapy -sickle cell disease -skin reactions to acrylic adhesives (On-Body Injector only) -an unusual or allergic reaction to pegfilgrastim, filgrastim, other medicines, foods, dyes, or preservatives -pregnant or trying to get pregnant -breast-feeding How should I use this medicine? This medicine is for injection under the skin. If you get this medicine at home, you will be taught how to prepare and give the pre-filled syringe or how to use the On-body Injector. Refer to the patient Instructions for Use for detailed instructions. Use exactly as directed. Take your medicine at regular intervals. Do not take your medicine more often than directed. It is important that you put your used needles and syringes in a special sharps container. Do not put them in a trash can. If you do not have a sharps container, call your pharmacist or healthcare provider to get one. Talk to your pediatrician regarding the use of this medicine in children. Special care may be needed. Overdosage: If you think you have taken too much of this medicine contact a poison control center or emergency room at once. NOTE: This medicine is only for you. Do not share this medicine with others. What if I miss a dose? It is  important not to miss your dose. Call your doctor or health care professional if you miss your dose. If you miss a dose due to an On-body Injector failure or leakage, a new dose should be administered as soon as possible using a single prefilled syringe for manual use. What may interact with this medicine? Interactions have not been studied. Give your health care provider a list of all the medicines, herbs, non-prescription drugs, or dietary supplements you use. Also tell them if you smoke, drink alcohol, or use illegal drugs. Some items may interact with your medicine. This list may not describe all possible interactions. Give your health care provider a list of all the medicines, herbs, non-prescription drugs, or dietary supplements you use. Also tell them if you smoke, drink alcohol, or use illegal drugs. Some items may interact with your medicine. What should I watch for while using this medicine? You may need blood work done while you are taking this medicine. If you are going to need a MRI, CT scan, or other procedure, tell your doctor that you are using this medicine (On-Body Injector only). What side effects may I notice from receiving this medicine? Side effects that you should report to your doctor or health care professional as soon as possible: -allergic reactions like skin rash, itching or hives, swelling of the face, lips, or tongue -dizziness -fever -pain, redness, or irritation at site where injected -pinpoint red spots on the skin -shortness of breath or breathing problems -stomach or side pain, or pain at the shoulder -swelling -tiredness -trouble passing urine Side effects that usually do not require medical attention (report to your doctor   or health care professional if they continue or are bothersome): -bone pain -muscle pain This list may not describe all possible side effects. Call your doctor for medical advice about side effects. You may report side effects to FDA at  1-800-FDA-1088. Where should I keep my medicine? Keep out of the reach of children. Store pre-filled syringes in a refrigerator between 2 and 8 degrees C (36 and 46 degrees F). Do not freeze. Keep in carton to protect from light. Throw away this medicine if it is left out of the refrigerator for more than 48 hours. Throw away any unused medicine after the expiration date. NOTE: This sheet is a summary. It may not cover all possible information. If you have questions about this medicine, talk to your doctor, pharmacist, or health care provider.  2015, Elsevier/Gold Standard. (2013-12-01 16:14:05)  

## 2014-04-26 ENCOUNTER — Ambulatory Visit: Payer: Medicare Other | Admitting: Physical Therapy

## 2014-04-26 DIAGNOSIS — M25519 Pain in unspecified shoulder: Secondary | ICD-10-CM | POA: Diagnosis not present

## 2014-04-28 ENCOUNTER — Ambulatory Visit: Payer: Medicare Other | Admitting: Physical Therapy

## 2014-04-28 DIAGNOSIS — M25519 Pain in unspecified shoulder: Secondary | ICD-10-CM | POA: Diagnosis not present

## 2014-05-02 ENCOUNTER — Ambulatory Visit (HOSPITAL_BASED_OUTPATIENT_CLINIC_OR_DEPARTMENT_OTHER): Payer: Medicare Other | Admitting: Oncology

## 2014-05-02 ENCOUNTER — Other Ambulatory Visit (HOSPITAL_BASED_OUTPATIENT_CLINIC_OR_DEPARTMENT_OTHER): Payer: Medicare Other

## 2014-05-02 ENCOUNTER — Ambulatory Visit: Payer: Medicare Other

## 2014-05-02 ENCOUNTER — Ambulatory Visit: Payer: Medicare Other | Admitting: Physical Therapy

## 2014-05-02 VITALS — BP 120/75 | HR 98 | Temp 98.6°F | Resp 19 | Ht 61.0 in | Wt 237.7 lb

## 2014-05-02 DIAGNOSIS — H04209 Unspecified epiphora, unspecified lacrimal gland: Secondary | ICD-10-CM | POA: Diagnosis not present

## 2014-05-02 DIAGNOSIS — C773 Secondary and unspecified malignant neoplasm of axilla and upper limb lymph nodes: Secondary | ICD-10-CM

## 2014-05-02 DIAGNOSIS — R197 Diarrhea, unspecified: Secondary | ICD-10-CM

## 2014-05-02 DIAGNOSIS — Z803 Family history of malignant neoplasm of breast: Secondary | ICD-10-CM | POA: Diagnosis not present

## 2014-05-02 DIAGNOSIS — Z95828 Presence of other vascular implants and grafts: Secondary | ICD-10-CM

## 2014-05-02 DIAGNOSIS — Z171 Estrogen receptor negative status [ER-]: Secondary | ICD-10-CM

## 2014-05-02 DIAGNOSIS — C50919 Malignant neoplasm of unspecified site of unspecified female breast: Secondary | ICD-10-CM

## 2014-05-02 DIAGNOSIS — C50412 Malignant neoplasm of upper-outer quadrant of left female breast: Secondary | ICD-10-CM

## 2014-05-02 DIAGNOSIS — C189 Malignant neoplasm of colon, unspecified: Secondary | ICD-10-CM | POA: Diagnosis not present

## 2014-05-02 DIAGNOSIS — K921 Melena: Secondary | ICD-10-CM

## 2014-05-02 DIAGNOSIS — M25519 Pain in unspecified shoulder: Secondary | ICD-10-CM | POA: Diagnosis not present

## 2014-05-02 LAB — COMPREHENSIVE METABOLIC PANEL (CC13)
ALBUMIN: 3.2 g/dL — AB (ref 3.5–5.0)
ALK PHOS: 151 U/L — AB (ref 40–150)
ALT: 40 U/L (ref 0–55)
AST: 22 U/L (ref 5–34)
Anion Gap: 11 mEq/L (ref 3–11)
BUN: 13.6 mg/dL (ref 7.0–26.0)
CHLORIDE: 107 meq/L (ref 98–109)
CO2: 22 mEq/L (ref 22–29)
Calcium: 9.5 mg/dL (ref 8.4–10.4)
Creatinine: 1 mg/dL (ref 0.6–1.1)
Glucose: 187 mg/dl — ABNORMAL HIGH (ref 70–140)
Potassium: 4 mEq/L (ref 3.5–5.1)
SODIUM: 140 meq/L (ref 136–145)
Total Bilirubin: 0.33 mg/dL (ref 0.20–1.20)
Total Protein: 6.9 g/dL (ref 6.4–8.3)

## 2014-05-02 LAB — CBC WITH DIFFERENTIAL/PLATELET
BASO%: 1.4 % (ref 0.0–2.0)
BASOS ABS: 0 10*3/uL (ref 0.0–0.1)
EOS%: 0.7 % (ref 0.0–7.0)
Eosinophils Absolute: 0 10*3/uL (ref 0.0–0.5)
HCT: 29.9 % — ABNORMAL LOW (ref 34.8–46.6)
HEMOGLOBIN: 9.9 g/dL — AB (ref 11.6–15.9)
LYMPH#: 0.3 10*3/uL — AB (ref 0.9–3.3)
LYMPH%: 17.4 % (ref 14.0–49.7)
MCH: 28.9 pg (ref 25.1–34.0)
MCHC: 33.1 g/dL (ref 31.5–36.0)
MCV: 87.4 fL (ref 79.5–101.0)
MONO#: 0.3 10*3/uL (ref 0.1–0.9)
MONO%: 18.8 % — ABNORMAL HIGH (ref 0.0–14.0)
NEUT#: 0.9 10*3/uL — ABNORMAL LOW (ref 1.5–6.5)
NEUT%: 61.7 % (ref 38.4–76.8)
Platelets: 185 10*3/uL (ref 145–400)
RBC: 3.42 10*6/uL — ABNORMAL LOW (ref 3.70–5.45)
RDW: 14.8 % — ABNORMAL HIGH (ref 11.2–14.5)
WBC: 1.4 10*3/uL — ABNORMAL LOW (ref 3.9–10.3)

## 2014-05-02 MED ORDER — SODIUM CHLORIDE 0.9 % IJ SOLN
10.0000 mL | INTRAMUSCULAR | Status: DC | PRN
  Administered 2014-05-02: 10 mL via INTRAVENOUS
  Filled 2014-05-02: qty 10

## 2014-05-02 MED ORDER — HEPARIN SOD (PORK) LOCK FLUSH 100 UNIT/ML IV SOLN
500.0000 [IU] | Freq: Once | INTRAVENOUS | Status: AC
  Administered 2014-05-02: 500 [IU] via INTRAVENOUS
  Filled 2014-05-02: qty 5

## 2014-05-02 NOTE — Patient Instructions (Signed)

## 2014-05-02 NOTE — Progress Notes (Signed)
[place]  [phone] [phone]     ID: [name] DOB: 1951-01-02  MR#: [id number]  KNL#:[id number]  PCP: Ruth Greenland, MD GYN: SU: [name] OTHER MD: [name]  CHIEF COMPLAINT: Stage III breast Lopez, triple negative   CURRENT TREATMENT: Neoadjuvant chemotherapy  BREAST Lopez HISTORY: From the original intake note:  [name] herself palpated a mass in her left breast, and immediately brought it to the attention of her primary care physician, Dr. Baird Lopez, who confirmed the finding and setup the patient for bilateral diagnostic mammography at the breast Center 02/20/2014. This showed a high density mass in the outer left breast measuring up to 3.7 cm. It corresponded to the site of palpable concern. In addition the normal 4 logically abnormal lymph nodes in the left axilla. The mass was palpable by exam, and by ultrasonography measured 2.6 cm. There were enlarged and morphologically abnormal lymph nodes in the left axilla, largest measuring 3 cm.  Biopsy of the left breast mass in question and 1 of the abnormal axillary lymph nodes 02/22/2014 showed ([id number]) biopsies to be positive for an invasive ductal carcinoma, grade 3, triple negative, with an MIB-1 of 90%. Specifically the HER-2 signals ratio was 1.05, and the number per cell 2.00.  MRI of the breasts 03/01/2014 showed a 3.8 cm enhancing mass with areas of apparent necrosis in the left breast, as well as the biopsy tract extending laterally, the combined measure 5.3 cm. There were no other masses or areas of abnormal enhancement. There were multiple abnormally enlarged left axillary lymph nodes with loss of the normal fatty hilum. These included level I and retropectoral lymph nodes.  The patient's subsequent history is as detailed below  INTERVAL HISTORY: [name] returns today for followup of her breast Lopez accompanied by one of her daughters. Today is day 9 cycle 4  of 4 planned cycles of doxorubicin and cyclophosphamide given every 2 weeks with Neulasta support on day 2. This will be followed by weekly carboplatin and paclitaxel x12.  REVIEW OF SYSTEMS: [name] tolerated the first part of her chemotherapy moderately well. She had no problems with nausea or vomiting. There was no problem with pain. She lost her appetite but "I still 8". She does have significant taste perversion and even water doesn't taste good. This fourth treatment was the hardest on her, and it really "knock her down". She feels very tired with minimal activity at present. She has also had some "runny eyes" usually a few days after each treatment. She developed sties in both eyes, the left one is better with some hot compresses she is using. Her psoriasis and arthritis were unchanged. She has been having a little bit of blood in her stool and has been having more frequent and looser stools than usual. She tells me she is due for a colonoscopy. A detailed review of systems today was otherwise stable  PAST MEDICAL HISTORY: Past Medical History  Diagnosis Date  . Colon Lopez   . Hx of rotator cuff surgery     PAST SURGICAL HISTORY: Past Surgical History  Procedure Laterality Date  . Abdominal hysterectomy    . Colon surgery      colectomy/colostomy with takedown    FAMILY HISTORY Family History  Problem Relation Age of Onset  . Lopez Mother 64    breast  . Lopez Maternal Aunt     breast Lopez at unknown age   the patient's father died at the age of 34  following a stroke in the setting of diabetes; the patient's mother is living at 54. The patient has 3 brothers and 2 sisters. [name], [name], was diagnosed with breast Lopez in her early 36s. There is no other breast or ovarian Lopez in the family. The patient herself was diagnosed with watts by history he is an incidentally found stage I colon carcinoma noted at the time of her hysterectomy. She had a partial  colectomy but no adjuvant treatment. We have no records regarding that procedure  GYNECOLOGIC HISTORY:  No LMP recorded. Patient has had a hysterectomy. Menarche age 67, first live birth age 63, the patient underwent total abdominal hysterectomy with bilateral salpingo-oophorectomy in her 60s. She did not take hormone replacement. She did not take oral contraceptives at any point.  SOCIAL HISTORY:  [name] work for [employer] for about 40 years, retiring in 2008. She is divorced and lives alone, with no pets. Her daughter [name] works for [employer] in [employer]. The second daughter, [name], works as a Research scientist (physical sciences) for [employer]. The patient has 6 grandchildren and one great-grandchild. She attends a [employer].    ADVANCED DIRECTIVES: Not in place. The patient intends to name her daughter [name]. [name] is work number is [id number]. On the patient's 03/01/2014 visit she was given a copy of the appropriate documents to complete and notarize at her discretion   HEALTH MAINTENANCE: History  Substance Use Topics  . Smoking status: Never Smoker   . Smokeless tobacco: Not on file  . Alcohol Use: No     Colonoscopy: 2000  PAP: May 2015  Bone density: 05/23/2004, at [facility] hospital; normal  Lipid panel:  No Known Allergies  Current Outpatient Prescriptions  Medication Sig Dispense Refill  . acetaminophen (TYLENOL) 500 MG tablet Take 1,000 mg by mouth every 6 (six) hours as needed for mild pain or moderate pain.      . ciprofloxacin (CIPRO) 500 MG tablet Take 1 tablet (500 mg total) by mouth 2 (two) times daily.  10 tablet  0  . dexamethasone (DECADRON) 4 MG tablet Take 2 tablets by mouth once a day on the day after chemotherapy and then take 2 tablets two times a day for 2 days. Take with food.  30 tablet  1  . lidocaine-prilocaine (EMLA) cream Apply 1 application topically once. Apply to port 1-2 hours prior to access.  30 g  1  .  LORazepam (ATIVAN) 0.5 MG tablet Take 1 tablet (0.5 mg total) by mouth every 6 (six) hours as needed (Nausea or vomiting).  30 tablet  0  . ondansetron (ZOFRAN) 8 MG tablet Take 1 tablet (8 mg total) by mouth 2 (two) times daily as needed. Start on the third day after chemotherapy.  30 tablet  0  . prochlorperazine (COMPAZINE) 10 MG tablet Take 1 tablet (10 mg total) by mouth every 6 (six) hours as needed (Nausea or vomiting).  30 tablet  1   No current facility-administered medications for this visit.    OBJECTIVE: Middle-aged [place] American woman in no acute distress Filed Vitals:   05/02/14 1630  BP: 120/75  Pulse: 98  Temp: 98.6 F (37 C)  Resp: 19     Body mass index is 44.94 kg/(m^2).    ECOG FS:1 - Symptomatic but completely ambulatory  Sclerae unicteric; small stye lower eyelid right eye Oropharynx clear and moist-- teeth in poor repair No cervical or supraclavicular adenopathy Lungs no  rales or rhonchi Heart regular rate and rhythm Abd soft, nontender, positive bowel sounds MSK no focal spinal tenderness, no upper extremity lymphedema Neuro: nonfocal, well oriented, positive affect Breasts: The right breast is unremarkable. Formerly I was able to easily palpate a 2-3 cm mass in the upper outer quadrant/lateral aspect of the left breast. I was also able to palpate adenopathy deep in the left axilla. Currently there are no masses palpable in either the breast or axilla on the left. There are also no skin or nipple changes of concern.   LAB RESULTS:  I No results found for this basename: SPEP,  UPEP,   kappa and lambda light chains    Lab Results  Component Value Date   WBC 1.4* 05/02/2014   NEUTROABS 0.9* 05/02/2014   HGB 9.9* 05/02/2014   HCT 29.9* 05/02/2014   MCV 87.4 05/02/2014   PLT 185 05/02/2014      Chemistry      Component Value Date/Time   NA 140 05/02/2014 1542   NA 143 04/24/2014 0958   K 4.0 05/02/2014 1542   K 3.5 04/24/2014 0958   CL 105 04/24/2014 0958    CO2 22 05/02/2014 1542   CO2 25 04/24/2014 0958   BUN 13.6 05/02/2014 1542   BUN 12 04/24/2014 0958   CREATININE 1.0 05/02/2014 1542   CREATININE 0.93 04/24/2014 0958      Component Value Date/Time   CALCIUM 9.5 05/02/2014 1542   CALCIUM 9.3 04/24/2014 0958   ALKPHOS 151* 05/02/2014 1542   ALKPHOS 121* 04/24/2014 0958   AST 22 05/02/2014 1542   AST 13 04/24/2014 0958   ALT 40 05/02/2014 1542   ALT 25 04/24/2014 0958   BILITOT 0.33 05/02/2014 1542   BILITOT 0.3 04/24/2014 0958       No results found for this basename: LABCA2    No components found with this basename: [id number]    No results found for this basename: INR,  in the last 168 hours  Urinalysis    Component Value Date/Time   COLORURINE YELLOW 04/05/2008 1120   APPEARANCEUR CLEAR 04/05/2008 1120   LABSPEC 1.021 04/05/2008 1120   PHURINE 6.0 04/05/2008 1120   GLUCOSEU NEGATIVE 04/05/2008 1120   HGBUR NEGATIVE 04/05/2008 1120   BILIRUBINUR NEGATIVE 04/05/2008 1120   KETONESUR NEGATIVE 04/05/2008 1120   PROTEINUR NEGATIVE 04/05/2008 1120   UROBILINOGEN 0.2 04/05/2008 1120   NITRITE NEGATIVE 04/05/2008 1120   LEUKOCYTESUR NEGATIVE MICROSCOPIC NOT DONE ON URINES WITH NEGATIVE PROTEIN, BLOOD, LEUKOCYTES, NITRITE, OR GLUCOSE <1000 mg/dL. 04/05/2008 1120    STUDIES: No results found.  ASSESSMENT: 63 y.o. [place] woman s/p breast and left axillary lymph node biopsy 02/22/2014, both positive for a clinical T2 N2, stage IIIA invasive ductal carcinoma, grade 3, triple negative, with an MIB-1 of 90%  (1) neoadjuvant chemotherapy started 03/13/2014, with cyclophosphamide and doxorubicin in dose dense fashion x4, with Neulasta support on day 2, completed 04/24/2014, to be followed by weekly carboplatin and paclitaxel x12  (2) definitive surgery to follow chemotherapy  (3) radiation to follow surgery  (4) remote history of partial colectomy for early stage colon Lopez incidentally found during TAH/BSO in the 1980s  (5) genetics testing  since 04/03/2014, results pending  (6) staging studies showed right-sided lung nodules, too small to characterize, and a dominant right-sided thyroid nodule unchanged in size as compared to 2011; to be followed  PLAN: Heavenlee has had a very good response clinically to the first half of her chemotherapy. She has  tolerated it moderately well. She feels ready to start with the second half of her treatments, which will consist of carboplatin and paclitaxel given weekly for a total of 12 weeks.  We discussed giving her next week but she would like to get it over with as soon as possible so her first day of treatment will be August 25. Today we discussed the possible toxicities, side effects and complications of carboplatin and paclitaxel and in particular she understands that there may be some nail dyscrasias, hyperpigmentation, and peripheral neuropathy, and that the peripheral neuropathy sometimes may be permanent. Obviously we will be following that closely with her.  She has been having some loose bowel movements and some blood in her stools. She was given a "packed" as well as a container and is going to give [place] a stool sample for C. difficile. Assuming that is negative, she will followup with Dr. Collene Mares as she is due for colonoscopy at some point.  I am sending an order for TobraDex eyedrops to see if that helps her epiphora.  Kermit Balo has a good understanding of the overall plan. She agrees with it. She knows of the goal of treatment in her case is cure. She will call with any problems that may develop before the next visit here.  Chauncey Cruel, MD   05/02/2014 5:20 PM

## 2014-05-04 ENCOUNTER — Ambulatory Visit: Payer: Medicare Other | Admitting: Physical Therapy

## 2014-05-04 ENCOUNTER — Ambulatory Visit: Payer: Medicare Other

## 2014-05-04 DIAGNOSIS — C50412 Malignant neoplasm of upper-outer quadrant of left female breast: Secondary | ICD-10-CM

## 2014-05-04 DIAGNOSIS — M25519 Pain in unspecified shoulder: Secondary | ICD-10-CM | POA: Diagnosis not present

## 2014-05-04 LAB — CLOSTRIDIUM DIFFICILE BY PCR: Toxigenic C. Difficile by PCR: NEGATIVE

## 2014-05-05 ENCOUNTER — Telehealth: Payer: Self-pay | Admitting: Oncology

## 2014-05-05 NOTE — Telephone Encounter (Signed)
Lvm advising appt 8/25 and asked pt to collect a new schedule then. Msg to infusion scheduler to make appts for 8/25 + 9/1. Copied GM's scheduler if f/u needed.

## 2014-05-08 ENCOUNTER — Telehealth: Payer: Self-pay | Admitting: *Deleted

## 2014-05-08 ENCOUNTER — Ambulatory Visit: Payer: Medicare Other | Admitting: Physical Therapy

## 2014-05-08 NOTE — Telephone Encounter (Signed)
Per staff message and POF I have scheduled appts. Advised scheduler of appts. JMW  

## 2014-05-09 ENCOUNTER — Ambulatory Visit (HOSPITAL_BASED_OUTPATIENT_CLINIC_OR_DEPARTMENT_OTHER): Payer: Medicare Other | Admitting: Nurse Practitioner

## 2014-05-09 ENCOUNTER — Other Ambulatory Visit (HOSPITAL_BASED_OUTPATIENT_CLINIC_OR_DEPARTMENT_OTHER): Payer: Medicare Other

## 2014-05-09 ENCOUNTER — Ambulatory Visit (HOSPITAL_BASED_OUTPATIENT_CLINIC_OR_DEPARTMENT_OTHER): Payer: Medicare Other

## 2014-05-09 ENCOUNTER — Ambulatory Visit: Payer: Medicare Other

## 2014-05-09 ENCOUNTER — Encounter: Payer: Self-pay | Admitting: Nurse Practitioner

## 2014-05-09 ENCOUNTER — Encounter: Payer: Medicare Other | Admitting: Physical Therapy

## 2014-05-09 VITALS — BP 131/85 | HR 86 | Temp 99.1°F | Resp 16

## 2014-05-09 VITALS — BP 135/88 | HR 98 | Resp 20 | Ht 61.0 in | Wt 242.0 lb

## 2014-05-09 DIAGNOSIS — E041 Nontoxic single thyroid nodule: Secondary | ICD-10-CM

## 2014-05-09 DIAGNOSIS — Z171 Estrogen receptor negative status [ER-]: Secondary | ICD-10-CM

## 2014-05-09 DIAGNOSIS — C50412 Malignant neoplasm of upper-outer quadrant of left female breast: Secondary | ICD-10-CM

## 2014-05-09 DIAGNOSIS — C50919 Malignant neoplasm of unspecified site of unspecified female breast: Secondary | ICD-10-CM | POA: Diagnosis not present

## 2014-05-09 DIAGNOSIS — R5381 Other malaise: Secondary | ICD-10-CM | POA: Diagnosis not present

## 2014-05-09 DIAGNOSIS — R5383 Other fatigue: Secondary | ICD-10-CM

## 2014-05-09 DIAGNOSIS — Z5111 Encounter for antineoplastic chemotherapy: Secondary | ICD-10-CM | POA: Diagnosis not present

## 2014-05-09 DIAGNOSIS — H04203 Unspecified epiphora, bilateral lacrimal glands: Secondary | ICD-10-CM

## 2014-05-09 DIAGNOSIS — H04209 Unspecified epiphora, unspecified lacrimal gland: Secondary | ICD-10-CM

## 2014-05-09 DIAGNOSIS — Z85038 Personal history of other malignant neoplasm of large intestine: Secondary | ICD-10-CM | POA: Diagnosis not present

## 2014-05-09 DIAGNOSIS — C773 Secondary and unspecified malignant neoplasm of axilla and upper limb lymph nodes: Secondary | ICD-10-CM

## 2014-05-09 DIAGNOSIS — Z95828 Presence of other vascular implants and grafts: Secondary | ICD-10-CM

## 2014-05-09 DIAGNOSIS — R911 Solitary pulmonary nodule: Secondary | ICD-10-CM

## 2014-05-09 LAB — COMPREHENSIVE METABOLIC PANEL (CC13)
ALT: 26 U/L (ref 0–55)
AST: 14 U/L (ref 5–34)
Albumin: 3 g/dL — ABNORMAL LOW (ref 3.5–5.0)
Alkaline Phosphatase: 123 U/L (ref 40–150)
Anion Gap: 9 mEq/L (ref 3–11)
BILIRUBIN TOTAL: 0.46 mg/dL (ref 0.20–1.20)
BUN: 9 mg/dL (ref 7.0–26.0)
CALCIUM: 9.2 mg/dL (ref 8.4–10.4)
CHLORIDE: 110 meq/L — AB (ref 98–109)
CO2: 23 mEq/L (ref 22–29)
CREATININE: 0.9 mg/dL (ref 0.6–1.1)
Glucose: 115 mg/dl (ref 70–140)
Potassium: 3.5 mEq/L (ref 3.5–5.1)
Sodium: 142 mEq/L (ref 136–145)
Total Protein: 6.3 g/dL — ABNORMAL LOW (ref 6.4–8.3)

## 2014-05-09 LAB — CBC WITH DIFFERENTIAL/PLATELET
BASO%: 0.8 % (ref 0.0–2.0)
Basophils Absolute: 0.1 10*3/uL (ref 0.0–0.1)
EOS%: 0.1 % (ref 0.0–7.0)
Eosinophils Absolute: 0 10*3/uL (ref 0.0–0.5)
HEMATOCRIT: 28.7 % — AB (ref 34.8–46.6)
HGB: 9.3 g/dL — ABNORMAL LOW (ref 11.6–15.9)
LYMPH%: 4 % — AB (ref 14.0–49.7)
MCH: 28.9 pg (ref 25.1–34.0)
MCHC: 32.4 g/dL (ref 31.5–36.0)
MCV: 89.1 fL (ref 79.5–101.0)
MONO#: 0.9 10*3/uL (ref 0.1–0.9)
MONO%: 10 % (ref 0.0–14.0)
NEUT#: 7.4 10*3/uL — ABNORMAL HIGH (ref 1.5–6.5)
NEUT%: 85.1 % — AB (ref 38.4–76.8)
NRBC: 5 % — AB (ref 0–0)
PLATELETS: 191 10*3/uL (ref 145–400)
RBC: 3.22 10*6/uL — AB (ref 3.70–5.45)
RDW: 16.6 % — ABNORMAL HIGH (ref 11.2–14.5)
WBC: 8.7 10*3/uL (ref 3.9–10.3)
lymph#: 0.4 10*3/uL — ABNORMAL LOW (ref 0.9–3.3)

## 2014-05-09 MED ORDER — FAMOTIDINE IN NACL 20-0.9 MG/50ML-% IV SOLN
INTRAVENOUS | Status: AC
  Filled 2014-05-09: qty 50

## 2014-05-09 MED ORDER — SODIUM CHLORIDE 0.9 % IV SOLN
Freq: Once | INTRAVENOUS | Status: AC
  Administered 2014-05-09: 12:00:00 via INTRAVENOUS

## 2014-05-09 MED ORDER — SODIUM CHLORIDE 0.9 % IV SOLN
267.8000 mg | Freq: Once | INTRAVENOUS | Status: AC
  Administered 2014-05-09: 270 mg via INTRAVENOUS
  Filled 2014-05-09: qty 27

## 2014-05-09 MED ORDER — PACLITAXEL CHEMO INJECTION 300 MG/50ML
80.0000 mg/m2 | Freq: Once | INTRAVENOUS | Status: AC
  Administered 2014-05-09: 174 mg via INTRAVENOUS
  Filled 2014-05-09: qty 29

## 2014-05-09 MED ORDER — DIPHENHYDRAMINE HCL 50 MG/ML IJ SOLN
INTRAMUSCULAR | Status: AC
  Filled 2014-05-09: qty 1

## 2014-05-09 MED ORDER — SODIUM CHLORIDE 0.9 % IJ SOLN
10.0000 mL | INTRAMUSCULAR | Status: DC | PRN
  Administered 2014-05-09: 10 mL via INTRAVENOUS
  Filled 2014-05-09: qty 10

## 2014-05-09 MED ORDER — DEXAMETHASONE SODIUM PHOSPHATE 20 MG/5ML IJ SOLN
20.0000 mg | Freq: Once | INTRAMUSCULAR | Status: AC
  Administered 2014-05-09: 20 mg via INTRAVENOUS

## 2014-05-09 MED ORDER — SODIUM CHLORIDE 0.9 % IJ SOLN
10.0000 mL | INTRAMUSCULAR | Status: DC | PRN
  Administered 2014-05-09: 10 mL
  Filled 2014-05-09: qty 10

## 2014-05-09 MED ORDER — HEPARIN SOD (PORK) LOCK FLUSH 100 UNIT/ML IV SOLN
500.0000 [IU] | Freq: Once | INTRAVENOUS | Status: AC | PRN
Start: 2014-05-09 — End: 2014-05-09
  Administered 2014-05-09: 500 [IU]
  Filled 2014-05-09: qty 5

## 2014-05-09 MED ORDER — DEXAMETHASONE SODIUM PHOSPHATE 20 MG/5ML IJ SOLN
INTRAMUSCULAR | Status: AC
  Filled 2014-05-09: qty 5

## 2014-05-09 MED ORDER — TOBRAMYCIN-DEXAMETHASONE 0.3-0.1 % OP SUSP: 1.0000 [drp] | OPHTHALMIC | Status: DC

## 2014-05-09 MED ORDER — FAMOTIDINE IN NACL 20-0.9 MG/50ML-% IV SOLN
20.0000 mg | Freq: Once | INTRAVENOUS | Status: AC
  Administered 2014-05-09: 20 mg via INTRAVENOUS

## 2014-05-09 MED ORDER — ONDANSETRON 16 MG/50ML IVPB (CHCC)
16.0000 mg | Freq: Once | INTRAVENOUS | Status: AC
  Administered 2014-05-09: 16 mg via INTRAVENOUS

## 2014-05-09 MED ORDER — DIPHENHYDRAMINE HCL 50 MG/ML IJ SOLN
50.0000 mg | Freq: Once | INTRAMUSCULAR | Status: AC
  Administered 2014-05-09: 50 mg via INTRAVENOUS

## 2014-05-09 MED ORDER — ONDANSETRON 16 MG/50ML IVPB (CHCC)
INTRAVENOUS | Status: AC
  Filled 2014-05-09: qty 16

## 2014-05-09 NOTE — Progress Notes (Signed)
[place]  Telephone:(336) [phone] Fax:(336) [phone]     ID: [name] DOB: October 14, 1961  MR#: [id number]  NID#:[id number]  PCP: Maximino Greenland, MD GYN: SU: Rolm Bookbinder OTHER MD: Gery Pray  CHIEF COMPLAINT: Stage III breast cancer, triple negative   CURRENT TREATMENT: Neoadjuvant chemotherapy  BREAST CANCER HISTORY: From the original intake note:  [name] herself palpated a mass in her left breast, and immediately brought it to the attention of her primary care physician, Dr. Baird Cancer, who confirmed the finding and setup the patient for bilateral diagnostic mammography at the breast Center 02/20/2014. This showed a high density mass in the outer left breast measuring up to 3.7 cm. It corresponded to the site of palpable concern. In addition the normal 4 logically abnormal lymph nodes in the left axilla. The mass was palpable by exam, and by ultrasonography measured 2.6 cm. There were enlarged and morphologically abnormal lymph nodes in the left axilla, largest measuring 3 cm.  Biopsy of the left breast mass in question and 1 of the abnormal axillary lymph nodes 02/22/2014 showed ([id number]) biopsies to be positive for an invasive ductal carcinoma, grade 3, triple negative, with an MIB-1 of 90%. Specifically the HER-2 signals ratio was 1.05, and the number per cell 2.00.  MRI of the breasts 03/01/2014 showed a 3.8 cm enhancing mass with areas of apparent necrosis in the left breast, as well as the biopsy tract extending laterally, the combined measure 5.3 cm. There were no other masses or areas of abnormal enhancement. There were multiple abnormally enlarged left axillary lymph nodes with loss of the normal fatty hilum. These included level I and retropectoral lymph nodes.  The patient's subsequent history is as detailed below  INTERVAL HISTORY: [name] returns alone today for follow up of her breast cancer. Today is day 1, cycle 1 of 12 planned weekly doses of  carboplatin and paclitaxel. Her loose bowel movements have resolved, she is no longer seeing blood in her stool and the c.diff returned negative. She is will be following up with a colonoscopy with her PCP. Her pharmacy never received the tobradex prescription for her watery eyes, so this symptom has continued. The sties in her eyes are relieved by warm compresses.   REVIEW OF SYSTEMS: Phylicia denies fevers, chills, nausea, vomiting, constipation or diarrhea. Her appetite "isn't the best" and she experiences taste perversion with most everything but she is still eating. She is fatigued but this is mild. She is in no pain and denies peripheral neuropathy. A detailed review of systems was otherwise stable.  PAST MEDICAL HISTORY: Past Medical History  Diagnosis Date  . Colon cancer   . Hx of rotator cuff surgery     PAST SURGICAL HISTORY: Past Surgical History  Procedure Laterality Date  . Abdominal hysterectomy    . Colon surgery      colectomy/colostomy with takedown    FAMILY HISTORY Family History  Problem Relation Age of Onset  . Cancer Mother 41    breast  . Cancer Maternal Aunt     breast cancer at unknown age   the patient's father died at the age of 57 following a stroke in the setting of diabetes; the patient's mother is living at 28. The patient has 3 brothers and 2 sisters. [name], [name], was diagnosed with breast cancer in her early 87s. There is no other breast or ovarian cancer in the family. The patient herself was diagnosed with watts by history he is  an incidentally found stage I colon carcinoma noted at the time of her hysterectomy. She had a partial colectomy but no adjuvant treatment. We have no records regarding that procedure  GYNECOLOGIC HISTORY:  No LMP recorded. Patient has had a hysterectomy. Menarche age 15, first live birth age 9, the patient underwent total abdominal hysterectomy with bilateral salpingo-oophorectomy in her 43s. She did not  take hormone replacement. She did not take oral contraceptives at any point.  SOCIAL HISTORY:  Ziya work for [employer] for about 40 years, retiring in 2008. She is divorced and lives alone, with no pets. Her daughter [name] works for [employer] in [employer] [place]. The second daughter, [name], works as a Research scientist (physical sciences) for [employer]. The patient has 6 grandchildren and one great-grandchild. She attends a [employer].    ADVANCED DIRECTIVES: Not in place. The patient intends to name her daughter [name]. her healthcare power of attorney. [name] is work number is [id number]. On the patient's 03/01/2014 visit she was given a copy of the appropriate documents to complete and notarize at her discretion   HEALTH MAINTENANCE: History  Substance Use Topics  . Smoking status: Never Smoker   . Smokeless tobacco: Not on file  . Alcohol Use: No     Colonoscopy: 2000  PAP: May 2015  Bone density: 05/23/2004, at [facility] hospital; normal  Lipid panel:  No Known Allergies  Current Outpatient Prescriptions  Medication Sig Dispense Refill  . acetaminophen (TYLENOL) 500 MG tablet Take 1,000 mg by mouth every 6 (six) hours as needed for mild pain or moderate pain.      . ciprofloxacin (CIPRO) 500 MG tablet Take 1 tablet (500 mg total) by mouth 2 (two) times daily.  10 tablet  0  . dexamethasone (DECADRON) 4 MG tablet Take 2 tablets by mouth once a day on the day after chemotherapy and then take 2 tablets two times a day for 2 days. Take with food.  30 tablet  1  . lidocaine-prilocaine (EMLA) cream Apply 1 application topically once. Apply to port 1-2 hours prior to access.  30 g  1  . ondansetron (ZOFRAN) 8 MG tablet Take 1 tablet (8 mg total) by mouth 2 (two) times daily as needed. Start on the third day after chemotherapy.  30 tablet  0  . tobramycin-dexamethasone (TOBRADEX) ophthalmic solution Place 1 drop into both eyes every 4 (four) hours while awake.  5 mL  0   No current  facility-administered medications for this visit.    OBJECTIVE: Middle-aged [place] American woman in no acute distress Filed Vitals:   05/09/14 0948  BP: 135/88  Pulse: 98  Resp: 20     Body mass index is 45.75 kg/(m^2).    ECOG FS:1 - Symptomatic but completely ambulatory  Skin: warm, dry  HEENT: sclerae anicteric, conjunctivae pink, oropharynx clear. Small stye to left eyelid. Excessive tear production. No thrush or mucositis.  Lymph Nodes: No cervical or supraclavicular lymphadenopathy  Lungs: clear to auscultation bilaterally, no rales, wheezes, or rhonci  Heart: regular rate and rhythm  Abdomen: round, soft, non tender, positive bowel sounds  Musculoskeletal: No focal spinal tenderness, no peripheral edema  Neuro: non focal, well oriented, positive affect  Breasts: deferred  LAB RESULTS:  I No results found for this basename: SPEP,  UPEP,   kappa and lambda light chains    Lab Results  Component Value Date   WBC 8.7 05/09/2014   NEUTROABS 7.4* 05/09/2014   HGB 9.3* 05/09/2014  HCT 28.7* 05/09/2014   MCV 89.1 05/09/2014   PLT 191 05/09/2014      Chemistry      Component Value Date/Time   NA 142 05/09/2014 0842   NA 143 04/24/2014 0958   K 3.5 05/09/2014 0842   K 3.5 04/24/2014 0958   CL 105 04/24/2014 0958   CO2 23 05/09/2014 0842   CO2 25 04/24/2014 0958   BUN 9.0 05/09/2014 0842   BUN 12 04/24/2014 0958   CREATININE 0.9 05/09/2014 0842   CREATININE 0.93 04/24/2014 0958      Component Value Date/Time   CALCIUM 9.2 05/09/2014 0842   CALCIUM 9.3 04/24/2014 0958   ALKPHOS 123 05/09/2014 0842   ALKPHOS 121* 04/24/2014 0958   AST 14 05/09/2014 0842   AST 13 04/24/2014 0958   ALT 26 05/09/2014 0842   ALT 25 04/24/2014 0958   BILITOT 0.46 05/09/2014 0842   BILITOT 0.3 04/24/2014 0958       No results found for this basename: LABCA2    No components found with this basename: [id number]    No results found for this basename: INR,  in the last 168 hours  Urinalysis      Component Value Date/Time   COLORURINE YELLOW 04/05/2008 1120   APPEARANCEUR CLEAR 04/05/2008 1120   LABSPEC 1.021 04/05/2008 1120   PHURINE 6.0 04/05/2008 1120   GLUCOSEU NEGATIVE 04/05/2008 1120   HGBUR NEGATIVE 04/05/2008 1120   BILIRUBINUR NEGATIVE 04/05/2008 1120   KETONESUR NEGATIVE 04/05/2008 1120   PROTEINUR NEGATIVE 04/05/2008 1120   UROBILINOGEN 0.2 04/05/2008 1120   NITRITE NEGATIVE 04/05/2008 1120   LEUKOCYTESUR NEGATIVE MICROSCOPIC NOT DONE ON URINES WITH NEGATIVE PROTEIN, BLOOD, LEUKOCYTES, NITRITE, OR GLUCOSE <1000 mg/dL. 04/05/2008 1120    STUDIES: No results found.  ASSESSMENT: 63 y.o. [place] woman s/p breast and left axillary lymph node biopsy 02/22/2014, both positive for a clinical T2 N2, stage IIIA invasive ductal carcinoma, grade 3, triple negative, with an MIB-1 of 90%  (1) neoadjuvant chemotherapy started 03/13/2014, with cyclophosphamide and doxorubicin in dose dense fashion x4, with Neulasta support on day 2, completed 04/24/2014, to be followed by weekly carboplatin and paclitaxel x12  (2) definitive surgery to follow chemotherapy  (3) radiation to follow surgery  (4) remote history of partial colectomy for early stage colon cancer incidentally found during TAH/BSO in the 1980s  (5) genetics testing since 04/03/2014, results pending  (6) staging studies showed right-sided lung nodules, too small to characterize, and a dominant right-sided thyroid nodule unchanged in size as compared to 2011; to be followed  PLAN: Nalleli is doing well, she tolerated her previous regimen of chemo very well. She is ready to start with cycle 1 of carboplatin and paclitaxel today so we will proceed. The labs were reviewed in detail and were stable, with her hbg at 9.8 being the only noteworthy result. She is relatively asymptomatic and states her fatigue has actually improved this week. We will continue to watch for symptomatic anemia.  I went ahead a sent a prescription for the  tobradex for her eyes, as previously discussed with Dr. Jana Hakim. Maloni will return next week for labs, an office visit, and the start of cycle 2. She understands and agrees with this plan. She has been encouraged to call with any issues that might arise before her next visit here.   Marcelino Duster, NP   05/09/2014 10:58 AM

## 2014-05-09 NOTE — Patient Instructions (Signed)

## 2014-05-09 NOTE — Patient Instructions (Signed)
[facility] Discharge Instructions for Patients Receiving Chemotherapy  Today you received the following chemotherapy agents Taxol/Carboplatin  To help prevent nausea and vomiting after your treatment, we encourage you to take your nausea medication    If you develop nausea and vomiting that is not controlled by your nausea medication, call the clinic.   BELOW ARE SYMPTOMS THAT SHOULD BE REPORTED IMMEDIATELY:  *FEVER GREATER THAN 100.5 F  *CHILLS WITH OR WITHOUT FEVER  NAUSEA AND VOMITING THAT IS NOT CONTROLLED WITH YOUR NAUSEA MEDICATION  *UNUSUAL SHORTNESS OF BREATH  *UNUSUAL BRUISING OR BLEEDING  TENDERNESS IN MOUTH AND THROAT WITH OR WITHOUT PRESENCE OF ULCERS  *URINARY PROBLEMS  *BOWEL PROBLEMS  UNUSUAL RASH Items with * indicate a potential emergency and should be followed up as soon as possible.  Feel free to call the clinic you have any questions or concerns. The clinic phone number is [phone].  Carboplatin injection What is this medicine? CARBOPLATIN (KAR boe pla tin) is a chemotherapy drug. It targets fast dividing cells, like cancer cells, and causes these cells to die. This medicine is used to treat ovarian cancer and many other cancers. This medicine may be used for other purposes; ask your health care provider or pharmacist if you have questions. COMMON BRAND NAME(S): Paraplatin What should I tell my health care provider before I take this medicine? They need to know if you have any of these conditions: -blood disorders -hearing problems -kidney disease -recent or ongoing radiation therapy -an unusual or allergic reaction to carboplatin, cisplatin, other chemotherapy, other medicines, foods, dyes, or preservatives -pregnant or trying to get pregnant -breast-feeding How should I use this medicine? This drug is usually given as an infusion into a vein. It is administered in a hospital or clinic by a specially trained health care  professional. Talk to your pediatrician regarding the use of this medicine in children. Special care may be needed. Overdosage: If you think you have taken too much of this medicine contact a poison control center or emergency room at once. NOTE: This medicine is only for you. Do not share this medicine with others. What if I miss a dose? It is important not to miss a dose. Call your doctor or health care professional if you are unable to keep an appointment. What may interact with this medicine? -medicines for seizures -medicines to increase blood counts like filgrastim, pegfilgrastim, sargramostim -some antibiotics like amikacin, gentamicin, neomycin, streptomycin, tobramycin -vaccines Talk to your doctor or health care professional before taking any of these medicines: -acetaminophen -aspirin -ibuprofen -ketoprofen -naproxen This list may not describe all possible interactions. Give your health care provider a list of all the medicines, herbs, non-prescription drugs, or dietary supplements you use. Also tell them if you smoke, drink alcohol, or use illegal drugs. Some items may interact with your medicine. What should I watch for while using this medicine? Your condition will be monitored carefully while you are receiving this medicine. You will need important blood work done while you are taking this medicine. This drug may make you feel generally unwell. This is not uncommon, as chemotherapy can affect healthy cells as well as cancer cells. Report any side effects. Continue your course of treatment even though you feel ill unless your doctor tells you to stop. In some cases, you may be given additional medicines to help with side effects. Follow all directions for their use. Call your doctor or health care professional for advice if you get a fever,   chills or sore throat, or other symptoms of a cold or flu. Do not treat yourself. This drug decreases your body's ability to fight infections.  Try to avoid being around people who are sick. This medicine may increase your risk to bruise or bleed. Call your doctor or health care professional if you notice any unusual bleeding. Be careful brushing and flossing your teeth or using a toothpick because you may get an infection or bleed more easily. If you have any dental work done, tell your dentist you are receiving this medicine. Avoid taking products that contain aspirin, acetaminophen, ibuprofen, naproxen, or ketoprofen unless instructed by your doctor. These medicines may hide a fever. Do not become pregnant while taking this medicine. Women should inform their doctor if they wish to become pregnant or think they might be pregnant. There is a potential for serious side effects to an unborn child. Talk to your health care professional or pharmacist for more information. Do not breast-feed an infant while taking this medicine. What side effects may I notice from receiving this medicine? Side effects that you should report to your doctor or health care professional as soon as possible: -allergic reactions like skin rash, itching or hives, swelling of the face, lips, or tongue -signs of infection - fever or chills, cough, sore throat, pain or difficulty passing urine -signs of decreased platelets or bleeding - bruising, pinpoint red spots on the skin, black, tarry stools, nosebleeds -signs of decreased red blood cells - unusually weak or tired, fainting spells, lightheadedness -breathing problems -changes in hearing -changes in vision -chest pain -high blood pressure -low blood counts - This drug may decrease the number of white blood cells, red blood cells and platelets. You may be at increased risk for infections and bleeding. -nausea and vomiting -pain, swelling, redness or irritation at the injection site -pain, tingling, numbness in the hands or feet -problems with balance, talking, walking -trouble passing urine or change in the  amount of urine Side effects that usually do not require medical attention (report to your doctor or health care professional if they continue or are bothersome): -hair loss -loss of appetite -metallic taste in the mouth or changes in taste This list may not describe all possible side effects. Call your doctor for medical advice about side effects. You may report side effects to FDA at 1-800-FDA-1088. Where should I keep my medicine? This drug is given in a hospital or clinic and will not be stored at home. NOTE: This sheet is a summary. It may not cover all possible information. If you have questions about this medicine, talk to your doctor, pharmacist, or health care provider.  2015, [employer] Standard. (2007-12-07 14:38:05)  Paclitaxel injection What is this medicine? PACLITAXEL (PAK li TAX el) is a chemotherapy drug. It targets fast dividing cells, like cancer cells, and causes these cells to die. This medicine is used to treat ovarian cancer, breast cancer, and other cancers. This medicine may be used for other purposes; ask your health care provider or pharmacist if you have questions. COMMON BRAND NAME(S): Onxol, Taxol What should I tell my health care provider before I take this medicine? They need to know if you have any of these conditions: -blood disorders -irregular heartbeat -infection (especially a virus infection such as chickenpox, cold sores, or herpes) -liver disease -previous or ongoing radiation therapy -an unusual or allergic reaction to paclitaxel, alcohol, polyoxyethylated castor oil, other chemotherapy agents, other medicines, foods, dyes, or preservatives -pregnant or trying to get   pregnant -breast-feeding How should I use this medicine? This drug is given as an infusion into a vein. It is administered in a hospital or clinic by a specially trained health care professional. Talk to your pediatrician regarding the use of this medicine in children. Special care  may be needed. Overdosage: If you think you have taken too much of this medicine contact a poison control center or emergency room at once. NOTE: This medicine is only for you. Do not share this medicine with others. What if I miss a dose? It is important not to miss your dose. Call your doctor or health care professional if you are unable to keep an appointment. What may interact with this medicine? Do not take this medicine with any of the following medications: -disulfiram -metronidazole This medicine may also interact with the following medications: -cyclosporine -diazepam -ketoconazole -medicines to increase blood counts like filgrastim, pegfilgrastim, sargramostim -other chemotherapy drugs like cisplatin, doxorubicin, epirubicin, etoposide, teniposide, vincristine -quinidine -testosterone -vaccines -verapamil Talk to your doctor or health care professional before taking any of these medicines: -acetaminophen -aspirin -ibuprofen -ketoprofen -naproxen This list may not describe all possible interactions. Give your health care provider a list of all the medicines, herbs, non-prescription drugs, or dietary supplements you use. Also tell them if you smoke, drink alcohol, or use illegal drugs. Some items may interact with your medicine. What should I watch for while using this medicine? Your condition will be monitored carefully while you are receiving this medicine. You will need important blood work done while you are taking this medicine. This drug may make you feel generally unwell. This is not uncommon, as chemotherapy can affect healthy cells as well as cancer cells. Report any side effects. Continue your course of treatment even though you feel ill unless your doctor tells you to stop. In some cases, you may be given additional medicines to help with side effects. Follow all directions for their use. Call your doctor or health care professional for advice if you get a fever,  chills or sore throat, or other symptoms of a cold or flu. Do not treat yourself. This drug decreases your body's ability to fight infections. Try to avoid being around people who are sick. This medicine may increase your risk to bruise or bleed. Call your doctor or health care professional if you notice any unusual bleeding. Be careful brushing and flossing your teeth or using a toothpick because you may get an infection or bleed more easily. If you have any dental work done, tell your dentist you are receiving this medicine. Avoid taking products that contain aspirin, acetaminophen, ibuprofen, naproxen, or ketoprofen unless instructed by your doctor. These medicines may hide a fever. Do not become pregnant while taking this medicine. Women should inform their doctor if they wish to become pregnant or think they might be pregnant. There is a potential for serious side effects to an unborn child. Talk to your health care professional or pharmacist for more information. Do not breast-feed an infant while taking this medicine. Men are advised not to father a child while receiving this medicine. What side effects may I notice from receiving this medicine? Side effects that you should report to your doctor or health care professional as soon as possible: -allergic reactions like skin rash, itching or hives, swelling of the face, lips, or tongue -low blood counts - This drug may decrease the number of white blood cells, red blood cells and platelets. You may be at increased risk for   infections and bleeding. -signs of infection - fever or chills, cough, sore throat, pain or difficulty passing urine -signs of decreased platelets or bleeding - bruising, pinpoint red spots on the skin, black, tarry stools, nosebleeds -signs of decreased red blood cells - unusually weak or tired, fainting spells, lightheadedness -breathing problems -chest pain -high or low blood pressure -mouth sores -nausea and  vomiting -pain, swelling, redness or irritation at the injection site -pain, tingling, numbness in the hands or feet -slow or irregular heartbeat -swelling of the ankle, feet, hands Side effects that usually do not require medical attention (report to your doctor or health care professional if they continue or are bothersome): -bone pain -complete hair loss including hair on your head, underarms, pubic hair, eyebrows, and eyelashes -changes in the color of fingernails -diarrhea -loosening of the fingernails -loss of appetite -muscle or joint pain -red flush to skin -sweating This list may not describe all possible side effects. Call your doctor for medical advice about side effects. You may report side effects to FDA at 1-800-FDA-1088. Where should I keep my medicine? This drug is given in a hospital or clinic and will not be stored at home. NOTE: This sheet is a summary. It may not cover all possible information. If you have questions about this medicine, talk to your doctor, pharmacist, or health care provider.  2015, [employer] Standard. (2012-10-25 16:41:21)     

## 2014-05-11 ENCOUNTER — Telehealth: Payer: Self-pay | Admitting: *Deleted

## 2014-05-11 ENCOUNTER — Telehealth: Payer: Self-pay | Admitting: Nurse Practitioner

## 2014-05-11 NOTE — Telephone Encounter (Signed)
Per staff message and POF I have scheduled appts. Advised scheduler of appts. JMW  

## 2014-05-11 NOTE — Telephone Encounter (Signed)
CLd & left pt message for appt times & dates-will mail copy of sch

## 2014-05-15 ENCOUNTER — Other Ambulatory Visit: Payer: Self-pay | Admitting: Oncology

## 2014-05-16 ENCOUNTER — Ambulatory Visit (HOSPITAL_BASED_OUTPATIENT_CLINIC_OR_DEPARTMENT_OTHER): Payer: Medicare Other

## 2014-05-16 ENCOUNTER — Other Ambulatory Visit (HOSPITAL_BASED_OUTPATIENT_CLINIC_OR_DEPARTMENT_OTHER): Payer: Medicare Other

## 2014-05-16 ENCOUNTER — Ambulatory Visit: Payer: Medicare Other

## 2014-05-16 ENCOUNTER — Ambulatory Visit (HOSPITAL_BASED_OUTPATIENT_CLINIC_OR_DEPARTMENT_OTHER): Payer: Medicare Other | Admitting: Oncology

## 2014-05-16 VITALS — BP 116/78 | HR 111 | Temp 99.1°F | Resp 18 | Ht 61.0 in | Wt 241.1 lb

## 2014-05-16 DIAGNOSIS — C50412 Malignant neoplasm of upper-outer quadrant of left female breast: Secondary | ICD-10-CM

## 2014-05-16 DIAGNOSIS — C50419 Malignant neoplasm of upper-outer quadrant of unspecified female breast: Secondary | ICD-10-CM

## 2014-05-16 DIAGNOSIS — Z95828 Presence of other vascular implants and grafts: Secondary | ICD-10-CM

## 2014-05-16 DIAGNOSIS — Z171 Estrogen receptor negative status [ER-]: Secondary | ICD-10-CM

## 2014-05-16 DIAGNOSIS — C773 Secondary and unspecified malignant neoplasm of axilla and upper limb lymph nodes: Secondary | ICD-10-CM

## 2014-05-16 DIAGNOSIS — Z85038 Personal history of other malignant neoplasm of large intestine: Secondary | ICD-10-CM | POA: Diagnosis not present

## 2014-05-16 DIAGNOSIS — C189 Malignant neoplasm of colon, unspecified: Secondary | ICD-10-CM

## 2014-05-16 DIAGNOSIS — Z5111 Encounter for antineoplastic chemotherapy: Secondary | ICD-10-CM | POA: Diagnosis not present

## 2014-05-16 LAB — COMPREHENSIVE METABOLIC PANEL (CC13)
ALT: 34 U/L (ref 0–55)
AST: 18 U/L (ref 5–34)
Albumin: 3 g/dL — ABNORMAL LOW (ref 3.5–5.0)
Alkaline Phosphatase: 95 U/L (ref 40–150)
Anion Gap: 9 mEq/L (ref 3–11)
BUN: 7.6 mg/dL (ref 7.0–26.0)
CO2: 22 mEq/L (ref 22–29)
Calcium: 9.2 mg/dL (ref 8.4–10.4)
Chloride: 111 mEq/L — ABNORMAL HIGH (ref 98–109)
Creatinine: 0.8 mg/dL (ref 0.6–1.1)
Glucose: 118 mg/dl (ref 70–140)
Potassium: 3 mEq/L — CL (ref 3.5–5.1)
Sodium: 142 mEq/L (ref 136–145)
Total Bilirubin: 0.57 mg/dL (ref 0.20–1.20)
Total Protein: 6.2 g/dL — ABNORMAL LOW (ref 6.4–8.3)

## 2014-05-16 LAB — CBC WITH DIFFERENTIAL/PLATELET
BASO%: 1.6 % (ref 0.0–2.0)
Basophils Absolute: 0.1 10*3/uL (ref 0.0–0.1)
EOS%: 1.2 % (ref 0.0–7.0)
Eosinophils Absolute: 0.1 10*3/uL (ref 0.0–0.5)
HCT: 28.2 % — ABNORMAL LOW (ref 34.8–46.6)
HGB: 9.3 g/dL — ABNORMAL LOW (ref 11.6–15.9)
LYMPH#: 0.3 10*3/uL — AB (ref 0.9–3.3)
LYMPH%: 6.4 % — ABNORMAL LOW (ref 14.0–49.7)
MCH: 29.4 pg (ref 25.1–34.0)
MCHC: 33 g/dL (ref 31.5–36.0)
MCV: 89.2 fL (ref 79.5–101.0)
MONO#: 0.8 10*3/uL (ref 0.1–0.9)
MONO%: 16 % — AB (ref 0.0–14.0)
NEUT#: 3.7 10*3/uL (ref 1.5–6.5)
NEUT%: 74.8 % (ref 38.4–76.8)
Platelets: 328 10*3/uL (ref 145–400)
RBC: 3.16 10*6/uL — AB (ref 3.70–5.45)
RDW: 16.6 % — AB (ref 11.2–14.5)
WBC: 5 10*3/uL (ref 3.9–10.3)

## 2014-05-16 MED ORDER — FAMOTIDINE IN NACL 20-0.9 MG/50ML-% IV SOLN
INTRAVENOUS | Status: AC
  Filled 2014-05-16: qty 50

## 2014-05-16 MED ORDER — POTASSIUM CHLORIDE ER 10 MEQ PO TBCR: 10.0000 meq | EXTENDED_RELEASE_TABLET | Freq: Two times a day (BID) | ORAL | Status: DC

## 2014-05-16 MED ORDER — HEPARIN SOD (PORK) LOCK FLUSH 100 UNIT/ML IV SOLN
500.0000 [IU] | Freq: Once | INTRAVENOUS | Status: AC | PRN
Start: 2014-05-16 — End: 2014-05-16
  Administered 2014-05-16: 500 [IU]
  Filled 2014-05-16: qty 5

## 2014-05-16 MED ORDER — DIPHENHYDRAMINE HCL 50 MG/ML IJ SOLN
25.0000 mg | Freq: Once | INTRAMUSCULAR | Status: AC
  Administered 2014-05-16: 25 mg via INTRAVENOUS

## 2014-05-16 MED ORDER — DEXAMETHASONE SODIUM PHOSPHATE 20 MG/5ML IJ SOLN
INTRAMUSCULAR | Status: AC
  Filled 2014-05-16: qty 5

## 2014-05-16 MED ORDER — DIPHENHYDRAMINE HCL 50 MG/ML IJ SOLN
INTRAMUSCULAR | Status: AC
  Filled 2014-05-16: qty 1

## 2014-05-16 MED ORDER — SODIUM CHLORIDE 0.9 % IV SOLN
Freq: Once | INTRAVENOUS | Status: AC
  Administered 2014-05-16: 12:00:00 via INTRAVENOUS

## 2014-05-16 MED ORDER — PACLITAXEL CHEMO INJECTION 300 MG/50ML
80.0000 mg/m2 | Freq: Once | INTRAVENOUS | Status: AC
  Administered 2014-05-16: 174 mg via INTRAVENOUS
  Filled 2014-05-16: qty 29

## 2014-05-16 MED ORDER — SODIUM CHLORIDE 0.9 % IJ SOLN
10.0000 mL | INTRAMUSCULAR | Status: DC | PRN
  Administered 2014-05-16: 10 mL via INTRAVENOUS
  Filled 2014-05-16: qty 10

## 2014-05-16 MED ORDER — SODIUM CHLORIDE 0.9 % IV SOLN
270.0000 mg | Freq: Once | INTRAVENOUS | Status: AC
  Administered 2014-05-16: 270 mg via INTRAVENOUS
  Filled 2014-05-16: qty 27

## 2014-05-16 MED ORDER — ONDANSETRON 16 MG/50ML IVPB (CHCC)
INTRAVENOUS | Status: AC
  Filled 2014-05-16: qty 16

## 2014-05-16 MED ORDER — ONDANSETRON 16 MG/50ML IVPB (CHCC)
16.0000 mg | Freq: Once | INTRAVENOUS | Status: AC
  Administered 2014-05-16: 16 mg via INTRAVENOUS

## 2014-05-16 MED ORDER — FAMOTIDINE IN NACL 20-0.9 MG/50ML-% IV SOLN
20.0000 mg | Freq: Once | INTRAVENOUS | Status: AC
  Administered 2014-05-16: 20 mg via INTRAVENOUS

## 2014-05-16 MED ORDER — DEXAMETHASONE SODIUM PHOSPHATE 20 MG/5ML IJ SOLN
20.0000 mg | Freq: Once | INTRAMUSCULAR | Status: AC
  Administered 2014-05-16: 20 mg via INTRAVENOUS

## 2014-05-16 MED ORDER — SODIUM CHLORIDE 0.9 % IJ SOLN
10.0000 mL | INTRAMUSCULAR | Status: DC | PRN
  Administered 2014-05-16: 10 mL
  Filled 2014-05-16: qty 10

## 2014-05-16 NOTE — Patient Instructions (Signed)
[facility] Discharge Instructions for Patients Receiving Chemotherapy  Today you received the following chemotherapy agents: Taxol and Carboplatin.  To help prevent nausea and vomiting after your treatment, we encourage you to take your nausea medication as prescribed.   If you develop nausea and vomiting that is not controlled by your nausea medication, call the clinic.   BELOW ARE SYMPTOMS THAT SHOULD BE REPORTED IMMEDIATELY:  *FEVER GREATER THAN 100.5 F  *CHILLS WITH OR WITHOUT FEVER  NAUSEA AND VOMITING THAT IS NOT CONTROLLED WITH YOUR NAUSEA MEDICATION  *UNUSUAL SHORTNESS OF BREATH  *UNUSUAL BRUISING OR BLEEDING  TENDERNESS IN MOUTH AND THROAT WITH OR WITHOUT PRESENCE OF ULCERS  *URINARY PROBLEMS  *BOWEL PROBLEMS  UNUSUAL RASH Items with * indicate a potential emergency and should be followed up as soon as possible.  Feel free to call the clinic you have any questions or concerns. The clinic phone number is [phone].    

## 2014-05-16 NOTE — Progress Notes (Signed)
Pt declined flu vac today. Dr. Jana Hakim notified, [facility] per MD to postpone if patient does not want this today.

## 2014-05-16 NOTE — Progress Notes (Signed)
[name]  Telephone:(336) [phone] Fax:(336) [phone]     ID: ALESI ZACHERY DOB: 06-03-1951  MR#: [id number]  UKG#:[id number]  PCP: Maximino Greenland, MD GYN: SU: Rolm Bookbinder OTHER MD: Gery Pray  CHIEF COMPLAINT: Stage III breast cancer, triple negative   CURRENT TREATMENT: Neoadjuvant chemotherapy  BREAST CANCER HISTORY: From the original intake note:  [name] herself palpated a mass in her left breast, and immediately brought it to the attention of her primary care physician, Dr. Baird Cancer, who confirmed the finding and setup the patient for bilateral diagnostic mammography at the breast Center 02/20/2014. This showed a high density mass in the outer left breast measuring up to 3.7 cm. It corresponded to the site of palpable concern. In addition the normal 4 logically abnormal lymph nodes in the left axilla. The mass was palpable by exam, and by ultrasonography measured 2.6 cm. There were enlarged and morphologically abnormal lymph nodes in the left axilla, largest measuring 3 cm.  Biopsy of the left breast mass in question and 1 of the abnormal axillary lymph nodes 02/22/2014 showed ([id number]) biopsies to be positive for an invasive ductal carcinoma, grade 3, triple negative, with an MIB-1 of 90%. Specifically the HER-2 signals ratio was 1.05, and the number per cell 2.00.  MRI of the breasts 03/01/2014 showed a 3.8 cm enhancing mass with areas of apparent necrosis in the left breast, as well as the biopsy tract extending laterally, the combined measure 5.3 cm. There were no other masses or areas of abnormal enhancement. There were multiple abnormally enlarged left axillary lymph nodes with loss of the normal fatty hilum. These included level I and retropectoral lymph nodes.  The patient's subsequent history is as detailed below  INTERVAL HISTORY: [name] returns today for followup of her breast cancer accompanied by her mother. This is day 1 cycle 2 of 12  planned cycles of weekly paclitaxel. She tolerated her first paclitaxel does remarkably well, and in particular did not have a "first dose reaction", no aches or pains, no fever, rash, or other immediate complications.   REVIEW OF SYSTEMS: [name] has noted that her nails are already beginning to darken. She has developed pain in the first big toe metatarsal joint. There is no erythema there that she is aware of and she has not injured it in any way. She is very tired, and basically goes from bed to bathroom and back. Her vision is a little bit better now that she is using eyedrops. Hot flashes are minimal. She noted a little bit of tingling sometime in her fingertips, but that was only 1 day, and it "left". A detailed review of systems today was otherwise noncontributory  PAST MEDICAL HISTORY: Past Medical History  Diagnosis Date  . Colon cancer   . Hx of rotator cuff surgery     PAST SURGICAL HISTORY: Past Surgical History  Procedure Laterality Date  . Abdominal hysterectomy    . Colon surgery      colectomy/colostomy with takedown    FAMILY HISTORY Family History  Problem Relation Age of Onset  . Cancer Mother 45    breast  . Cancer Maternal Aunt     breast cancer at unknown age   the patient's father died at the age of 34 following a stroke in the setting of diabetes; the patient's mother is living at 65. The patient has 3 brothers and 2 sisters. [name], [name], was diagnosed with breast cancer in her early 66s. There is  no other breast or ovarian cancer in the family. The patient herself was diagnosed with watts by history he is an incidentally found stage I colon carcinoma noted at the time of her hysterectomy. She had a partial colectomy but no adjuvant treatment. We have no records regarding that procedure  GYNECOLOGIC HISTORY:  No LMP recorded. Patient has had a hysterectomy. Menarche age 89, first live birth age 6, the patient underwent total abdominal  hysterectomy with bilateral salpingo-oophorectomy in her 11s. She did not take hormone replacement. She did not take oral contraceptives at any point.  SOCIAL HISTORY:  [name] work for [employer] for about 40 years, retiring in 2008. She is divorced and lives alone, with no pets. Her daughter [name] works for [employer] in [employer]. The second daughter, [name], works as a Research scientist (physical sciences) for [employer]. The patient has 6 grandchildren and one great-grandchild. She attends a [employer].    ADVANCED DIRECTIVES: Not in place. The patient intends to name her daughter [name]. [name] is work number is [id number]. On the patient's 03/01/2014 visit she was given a copy of the appropriate documents to complete and notarize at her discretion   HEALTH MAINTENANCE: History  Substance Use Topics  . Smoking status: Never Smoker   . Smokeless tobacco: Not on file  . Alcohol Use: No     Colonoscopy: 2000  PAP: May 2015  Bone density: 05/23/2004, at [facility] hospital; normal  Lipid panel:  No Known Allergies  Current Outpatient Prescriptions  Medication Sig Dispense Refill  . acetaminophen (TYLENOL) 500 MG tablet Take 1,000 mg by mouth every 6 (six) hours as needed for mild pain or moderate pain.      . ciprofloxacin (CIPRO) 500 MG tablet Take 1 tablet (500 mg total) by mouth 2 (two) times daily.  10 tablet  0  . dexamethasone (DECADRON) 4 MG tablet Take 2 tablets by mouth once a day on the day after chemotherapy and then take 2 tablets two times a day for 2 days. Take with food.  30 tablet  1  . lidocaine-prilocaine (EMLA) cream Apply 1 application topically once. Apply to port 1-2 hours prior to access.  30 g  1  . ondansetron (ZOFRAN) 8 MG tablet Take 1 tablet (8 mg total) by mouth 2 (two) times daily as needed. Start on the third day after chemotherapy.  30 tablet  0  . tobramycin-dexamethasone (TOBRADEX) ophthalmic solution Place 1 drop into both  eyes every 4 (four) hours while awake.  5 mL  0   No current facility-administered medications for this visit.    OBJECTIVE: Middle-aged [place] American woman who appears stated age 63 Vitals:   05/16/14 1044  BP: 116/78  Pulse: 111  Temp: 99.1 F (37.3 C)  Resp: 18     Body mass index is 45.58 kg/(m^2).    ECOG FS:1 - Symptomatic but completely ambulatory  Sclerae unicteric, pupils round and equal Oropharynx clear and moist-- no thrush or other lesions noted No cervical or supraclavicular adenopathy Lungs no rales or rhonchi Heart regular rate and rhythm Abd soft, obese, nontender, positive bowel sounds MSK no focal spinal tenderness, no upper extremity lymphedema Neuro: nonfocal, well oriented, positive affect Breasts: The right breast is unremarkable. I do not palpate any mass in the left breast, and there are no skin or nipple changes of concern. The left axilla is benign.   LAB RESULTS:  I No results found for this basename: SPEP,  UPEP,   kappa and lambda light chains    Lab Results  Component Value Date   WBC 5.0 05/16/2014   NEUTROABS 3.7 05/16/2014   HGB 9.3* 05/16/2014   HCT 28.2* 05/16/2014   MCV 89.2 05/16/2014   PLT 328 05/16/2014      Chemistry      Component Value Date/Time   NA 142 05/16/2014 1002   NA 143 04/24/2014 0958   K 3.0* 05/16/2014 1002   K 3.5 04/24/2014 0958   CL 105 04/24/2014 0958   CO2 22 05/16/2014 1002   CO2 25 04/24/2014 0958   BUN 7.6 05/16/2014 1002   BUN 12 04/24/2014 0958   CREATININE 0.8 05/16/2014 1002   CREATININE 0.93 04/24/2014 0958      Component Value Date/Time   CALCIUM 9.2 05/16/2014 1002   CALCIUM 9.3 04/24/2014 0958   ALKPHOS 95 05/16/2014 1002   ALKPHOS 121* 04/24/2014 0958   AST 18 05/16/2014 1002   AST 13 04/24/2014 0958   ALT 34 05/16/2014 1002   ALT 25 04/24/2014 0958   BILITOT 0.57 05/16/2014 1002   BILITOT 0.3 04/24/2014 0958       No results found for this basename: LABCA2    No components found with this basename: LABCA125      No results found for this basename: INR,  in the last 168 hours  Urinalysis    Component Value Date/Time   COLORURINE YELLOW 04/05/2008 1120   APPEARANCEUR CLEAR 04/05/2008 1120   LABSPEC 1.021 04/05/2008 1120   PHURINE 6.0 04/05/2008 1120   GLUCOSEU NEGATIVE 04/05/2008 1120   HGBUR NEGATIVE 04/05/2008 1120   BILIRUBINUR NEGATIVE 04/05/2008 1120   KETONESUR NEGATIVE 04/05/2008 1120   PROTEINUR NEGATIVE 04/05/2008 1120   UROBILINOGEN 0.2 04/05/2008 1120   NITRITE NEGATIVE 04/05/2008 1120   LEUKOCYTESUR NEGATIVE MICROSCOPIC NOT DONE ON URINES WITH NEGATIVE PROTEIN, BLOOD, LEUKOCYTES, NITRITE, OR GLUCOSE <1000 mg/dL. 04/05/2008 1120    STUDIES: No results found.  ASSESSMENT: 63 y.o. [place] woman s/p breast and left axillary lymph node biopsy 02/22/2014, both positive for a clinical T2 N2, stage IIIA invasive ductal carcinoma, grade 3, triple negative, with an MIB-1 of 90%  (1) neoadjuvant chemotherapy started 03/13/2014, with cyclophosphamide and doxorubicin in dose dense fashion x4, with Neulasta support on day 2, completed 04/24/2014, to be followed by weekly carboplatin and paclitaxel x12  (2) definitive surgery to follow chemotherapy  (3) radiation to follow surgery  (4) remote history of partial colectomy for early stage colon cancer incidentally found during TAH/BSO in the 1980s  (5) genetics testing since 04/03/2014, results pending  (6) staging studies showed right-sided lung nodules, too small to characterize, and a dominant right-sided thyroid nodule unchanged in size as compared to 2011; to be followed  PLAN: Tereka tolerated her first dose of Taxol without any unusual complications. That is very favorable. We are stopping the oral Decadron and she will use only ondansetron for nausea control. She will let us know if that does not work.  Her potassium has been dropping. I have encouraged her to eat fruit, but I am also going to start her on potassium twice a day. A  prescription was placed.  I am not obtaining a repeat breast MRI at this point since she has had a very obvious response, namely she had a very easily palpable mass in the left breast which is no longer palpable. We will obtain a repeat breast MRI at the end of her chemotherapy, before her surgery.  She knows to call for any problems that may develop before her next visit here.  Chauncey Cruel, MD   05/16/2014 11:05 AM

## 2014-05-16 NOTE — Patient Instructions (Signed)

## 2014-05-18 ENCOUNTER — Ambulatory Visit: Payer: Medicare Other | Attending: General Surgery | Admitting: Physical Therapy

## 2014-05-18 DIAGNOSIS — C50919 Malignant neoplasm of unspecified site of unspecified female breast: Secondary | ICD-10-CM | POA: Diagnosis not present

## 2014-05-18 DIAGNOSIS — Z803 Family history of malignant neoplasm of breast: Secondary | ICD-10-CM | POA: Diagnosis not present

## 2014-05-19 ENCOUNTER — Telehealth: Payer: Self-pay | Admitting: *Deleted

## 2014-05-19 DIAGNOSIS — H2510 Age-related nuclear cataract, unspecified eye: Secondary | ICD-10-CM | POA: Diagnosis not present

## 2014-05-19 DIAGNOSIS — H40039 Anatomical narrow angle, unspecified eye: Secondary | ICD-10-CM | POA: Diagnosis not present

## 2014-05-19 DIAGNOSIS — H5710 Ocular pain, unspecified eye: Secondary | ICD-10-CM | POA: Diagnosis not present

## 2014-05-19 NOTE — Telephone Encounter (Signed)
     Provider input needed: pain, pressure to eyes with pressure,blood shot   Reason for call: Pain and trouble opening eyes  Eyes: positive for redness and pain, pressure   ALLERGIES:  has No Known Allergies.  Patient last received chemotherapy/ treatment on C2 Taxol/carboplatin on 05-16-2014   Patient was last seen in the office on 05-16-2014  Next appt is 05-23-2014  Is patient having fevers greater than 100.5?  no, "Doesn't appear to have fever but didn't check"   Is patient having uncontrolled pain, or new pain? no   Is patient having new back pain that changes with position (worsens or eases when laying down?)  no   Is patient able to eat and drink? yes    Is patient able to pass stool without difficulty?   yes     Is patient having uncontrolled nausea?  no    Daughter Quiara Killian calls 05/19/2014 with complaint of  Eyes: positive for redness and "Mom with headache all day yesterday.  mom has pain in her eyes.  She can't open them.  I put a warm moist towel on them which felt good and helped open but still pain.  Eyes look blood shot.  Started eye drops (tobradex 05-09-2014) due to stye's in the corner of eyes that started a month ago.  Due to eye allergies she always has drainage."     Summary Based on the above information advised Daughter to  Await call with provider instructions.  [name] at work now.  Ask that we call Mom at home number [phone] and mom will call [name] at work if she needs to come in.     Winston-Spruiell, Mariane Burpee  05/19/2014, 9:47 AM   Background Info  [name]   DOB: 1951/06/08   MR#: [id number]   CSN#   [id number] 05/19/2014

## 2014-05-19 NOTE — Telephone Encounter (Signed)
This RN spoke with pt per call from dtr regarding eye problems.   [name] states watering of eyes " improved at first with the eye drops but then yesterday after I went outside in the sun they just got worse so I quit using the eye drops "  Per discussion- pt has seen at Dr Gean Quint office for ophthalmology-  This RN called to his office and obtained an appointment for today at 1230.  Called and informed pt who stated she will call her daughter and proceed as requested.

## 2014-05-19 NOTE — Telephone Encounter (Signed)
Collaborative phone note  For today reads opthalmology appt today at 12:30.

## 2014-05-23 ENCOUNTER — Ambulatory Visit: Payer: Medicare Other

## 2014-05-23 ENCOUNTER — Other Ambulatory Visit: Payer: Self-pay | Admitting: Oncology

## 2014-05-23 ENCOUNTER — Encounter: Payer: Medicare Other | Admitting: Physical Therapy

## 2014-05-23 ENCOUNTER — Other Ambulatory Visit (HOSPITAL_BASED_OUTPATIENT_CLINIC_OR_DEPARTMENT_OTHER): Payer: Medicare Other

## 2014-05-23 ENCOUNTER — Ambulatory Visit (HOSPITAL_BASED_OUTPATIENT_CLINIC_OR_DEPARTMENT_OTHER): Payer: Medicare Other | Admitting: Nurse Practitioner

## 2014-05-23 ENCOUNTER — Encounter: Payer: Self-pay | Admitting: Nurse Practitioner

## 2014-05-23 ENCOUNTER — Ambulatory Visit (HOSPITAL_BASED_OUTPATIENT_CLINIC_OR_DEPARTMENT_OTHER): Payer: Medicare Other

## 2014-05-23 VITALS — BP 144/94 | HR 106 | Temp 98.6°F | Resp 20 | Ht 61.0 in | Wt 239.8 lb

## 2014-05-23 DIAGNOSIS — C50412 Malignant neoplasm of upper-outer quadrant of left female breast: Secondary | ICD-10-CM

## 2014-05-23 DIAGNOSIS — E041 Nontoxic single thyroid nodule: Secondary | ICD-10-CM

## 2014-05-23 DIAGNOSIS — R918 Other nonspecific abnormal finding of lung field: Secondary | ICD-10-CM

## 2014-05-23 DIAGNOSIS — Z95828 Presence of other vascular implants and grafts: Secondary | ICD-10-CM

## 2014-05-23 DIAGNOSIS — E876 Hypokalemia: Secondary | ICD-10-CM

## 2014-05-23 DIAGNOSIS — Z5111 Encounter for antineoplastic chemotherapy: Secondary | ICD-10-CM

## 2014-05-23 DIAGNOSIS — C50919 Malignant neoplasm of unspecified site of unspecified female breast: Secondary | ICD-10-CM

## 2014-05-23 DIAGNOSIS — Z170 Estrogen receptor positive status [ER+]: Secondary | ICD-10-CM

## 2014-05-23 DIAGNOSIS — C773 Secondary and unspecified malignant neoplasm of axilla and upper limb lymph nodes: Secondary | ICD-10-CM | POA: Diagnosis not present

## 2014-05-23 DIAGNOSIS — H04203 Unspecified epiphora, bilateral lacrimal glands: Secondary | ICD-10-CM

## 2014-05-23 LAB — CBC WITH DIFFERENTIAL/PLATELET
BASO%: 2.2 % — AB (ref 0.0–2.0)
Basophils Absolute: 0.1 10*3/uL (ref 0.0–0.1)
EOS%: 6.6 % (ref 0.0–7.0)
Eosinophils Absolute: 0.2 10*3/uL (ref 0.0–0.5)
HCT: 28.4 % — ABNORMAL LOW (ref 34.8–46.6)
HGB: 9.2 g/dL — ABNORMAL LOW (ref 11.6–15.9)
LYMPH%: 11 % — AB (ref 14.0–49.7)
MCH: 29.3 pg (ref 25.1–34.0)
MCHC: 32.4 g/dL (ref 31.5–36.0)
MCV: 90.4 fL (ref 79.5–101.0)
MONO#: 0.4 10*3/uL (ref 0.1–0.9)
MONO%: 13.2 % (ref 0.0–14.0)
NEUT#: 1.8 10*3/uL (ref 1.5–6.5)
NEUT%: 67 % (ref 38.4–76.8)
PLATELETS: 249 10*3/uL (ref 145–400)
RBC: 3.14 10*6/uL — AB (ref 3.70–5.45)
RDW: 18.3 % — ABNORMAL HIGH (ref 11.2–14.5)
WBC: 2.7 10*3/uL — ABNORMAL LOW (ref 3.9–10.3)
lymph#: 0.3 10*3/uL — ABNORMAL LOW (ref 0.9–3.3)
nRBC: 4 % — ABNORMAL HIGH (ref 0–0)

## 2014-05-23 LAB — COMPREHENSIVE METABOLIC PANEL (CC13)
ALK PHOS: 99 U/L (ref 40–150)
ALT: 46 U/L (ref 0–55)
AST: 26 U/L (ref 5–34)
Albumin: 3.1 g/dL — ABNORMAL LOW (ref 3.5–5.0)
Anion Gap: 8 mEq/L (ref 3–11)
BUN: 5.7 mg/dL — ABNORMAL LOW (ref 7.0–26.0)
CO2: 25 mEq/L (ref 22–29)
CREATININE: 0.8 mg/dL (ref 0.6–1.1)
Calcium: 9.4 mg/dL (ref 8.4–10.4)
Chloride: 110 mEq/L — ABNORMAL HIGH (ref 98–109)
Glucose: 118 mg/dl (ref 70–140)
Potassium: 3.6 mEq/L (ref 3.5–5.1)
Sodium: 143 mEq/L (ref 136–145)
Total Bilirubin: 0.86 mg/dL (ref 0.20–1.20)
Total Protein: 6.5 g/dL (ref 6.4–8.3)

## 2014-05-23 MED ORDER — DEXAMETHASONE SODIUM PHOSPHATE 20 MG/5ML IJ SOLN
20.0000 mg | Freq: Once | INTRAMUSCULAR | Status: AC
  Administered 2014-05-23: 20 mg via INTRAVENOUS

## 2014-05-23 MED ORDER — FAMOTIDINE IN NACL 20-0.9 MG/50ML-% IV SOLN
20.0000 mg | Freq: Once | INTRAVENOUS | Status: AC
  Administered 2014-05-23: 20 mg via INTRAVENOUS

## 2014-05-23 MED ORDER — DIPHENHYDRAMINE HCL 50 MG/ML IJ SOLN
INTRAMUSCULAR | Status: AC
  Filled 2014-05-23: qty 1

## 2014-05-23 MED ORDER — SODIUM CHLORIDE 0.9 % IV SOLN
Freq: Once | INTRAVENOUS | Status: AC
  Administered 2014-05-23: 12:00:00 via INTRAVENOUS

## 2014-05-23 MED ORDER — ONDANSETRON 16 MG/50ML IVPB (CHCC)
16.0000 mg | Freq: Once | INTRAVENOUS | Status: AC
  Administered 2014-05-23: 16 mg via INTRAVENOUS

## 2014-05-23 MED ORDER — SODIUM CHLORIDE 0.9 % IJ SOLN
10.0000 mL | INTRAMUSCULAR | Status: DC | PRN
  Administered 2014-05-23: 10 mL via INTRAVENOUS
  Filled 2014-05-23: qty 10

## 2014-05-23 MED ORDER — DEXAMETHASONE SODIUM PHOSPHATE 20 MG/5ML IJ SOLN
INTRAMUSCULAR | Status: AC
  Filled 2014-05-23: qty 5

## 2014-05-23 MED ORDER — SODIUM CHLORIDE 0.9 % IJ SOLN
10.0000 mL | INTRAMUSCULAR | Status: DC | PRN
  Administered 2014-05-23: 10 mL
  Filled 2014-05-23: qty 10

## 2014-05-23 MED ORDER — PACLITAXEL CHEMO INJECTION 300 MG/50ML
80.0000 mg/m2 | Freq: Once | INTRAVENOUS | Status: AC
  Administered 2014-05-23: 174 mg via INTRAVENOUS
  Filled 2014-05-23: qty 29

## 2014-05-23 MED ORDER — DIPHENHYDRAMINE HCL 50 MG/ML IJ SOLN
25.0000 mg | Freq: Once | INTRAMUSCULAR | Status: AC
  Administered 2014-05-23: 25 mg via INTRAVENOUS

## 2014-05-23 MED ORDER — FAMOTIDINE IN NACL 20-0.9 MG/50ML-% IV SOLN
INTRAVENOUS | Status: AC
  Filled 2014-05-23: qty 50

## 2014-05-23 MED ORDER — HEPARIN SOD (PORK) LOCK FLUSH 100 UNIT/ML IV SOLN
500.0000 [IU] | Freq: Once | INTRAVENOUS | Status: AC | PRN
  Administered 2014-05-23: 500 [IU]
  Filled 2014-05-23: qty 5

## 2014-05-23 MED ORDER — SODIUM CHLORIDE 0.9 % IV SOLN
267.8000 mg | Freq: Once | INTRAVENOUS | Status: AC
  Administered 2014-05-23: 270 mg via INTRAVENOUS
  Filled 2014-05-23: qty 27

## 2014-05-23 MED ORDER — ONDANSETRON 16 MG/50ML IVPB (CHCC)
INTRAVENOUS | Status: AC
  Filled 2014-05-23: qty 16

## 2014-05-23 NOTE — Patient Instructions (Signed)

## 2014-05-23 NOTE — Progress Notes (Signed)
[place]  Telephone:(336) [phone] Fax:(336) [phone]     ID: [name] DOB: 1951/05/31  MR#: [id number]  RWE#:[id number]  PCP: Maximino Greenland, MD GYN: SU: Rolm Bookbinder OTHER MD: Gery Pray  CHIEF COMPLAINT: Stage III breast cancer, triple negative   CURRENT TREATMENT: Neoadjuvant chemotherapy  BREAST CANCER HISTORY: From the original intake note:  Bailei herself palpated a mass in her left breast, and immediately brought it to the attention of her primary care physician, Dr. Baird Cancer, who confirmed the finding and setup the patient for bilateral diagnostic mammography at the breast Center 02/20/2014. This showed a high density mass in the outer left breast measuring up to 3.7 cm. It corresponded to the site of palpable concern. In addition the normal 4 logically abnormal lymph nodes in the left axilla. The mass was palpable by exam, and by ultrasonography measured 2.6 cm. There were enlarged and morphologically abnormal lymph nodes in the left axilla, largest measuring 3 cm.  Biopsy of the left breast mass in question and 1 of the abnormal axillary lymph nodes 02/22/2014 showed ([id number]) biopsies to be positive for an invasive ductal carcinoma, grade 3, triple negative, with an MIB-1 of 90%. Specifically the HER-2 signals ratio was 1.05, and the number per cell 2.00.  MRI of the breasts 03/01/2014 showed a 3.8 cm enhancing mass with areas of apparent necrosis in the left breast, as well as the biopsy tract extending laterally, the combined measure 5.3 cm. There were no other masses or areas of abnormal enhancement. There were multiple abnormally enlarged left axillary lymph nodes with loss of the normal fatty hilum. These included level I and retropectoral lymph nodes.  The patient's subsequent history is as detailed below   INTERVAL HISTORY: [name] returns today for follow up of her breast cancer. Today is day 1, cycle 3 of 12 planned weekly cycles of  paclitaxel and carboplatin. She believe she is tolerating the chemo well with no discernable side effects.   REVIEW OF SYSTEMS: [name] denies fevers, chills, nausea, vomiting, or changes in bowel or bladder habits. She has maintained a good appetite. Her energy level is much improved. She denies shortness of breath, chest pain, or cough. Her nails have started to darken. She visited an ophthalmologist last week for her excessive tearing, and was prescribed loteprednol (lotemax) drops. Her left eye has cleared up, but the right eye continues to drain. When she opens the right eye, it creates a headache like pressure, so she mostly keeps it closed and sits in dim lighting whenever possible.   PAST MEDICAL HISTORY: Past Medical History  Diagnosis Date  . Colon cancer   . Hx of rotator cuff surgery     PAST SURGICAL HISTORY: Past Surgical History  Procedure Laterality Date  . Abdominal hysterectomy    . Colon surgery      colectomy/colostomy with takedown    FAMILY HISTORY Family History  Problem Relation Age of Onset  . Cancer Mother 37    breast  . Cancer Maternal Aunt     breast cancer at unknown age   the patient's father died at the age of 15 following a stroke in the setting of diabetes; the patient's mother is living at 37. The patient has 3 brothers and 2 sisters. [name], [name], was diagnosed with breast cancer in her early 39s. There is no other breast or ovarian cancer in the family. The patient herself was diagnosed with watts by history he is an  incidentally found stage I colon carcinoma noted at the time of her hysterectomy. She had a partial colectomy but no adjuvant treatment. We have no records regarding that procedure  GYNECOLOGIC HISTORY:  No LMP recorded. Patient has had a hysterectomy. Menarche age 78, first live birth age 25, the patient underwent total abdominal hysterectomy with bilateral salpingo-oophorectomy in her 55s. She did not take hormone  replacement. She did not take oral contraceptives at any point.  SOCIAL HISTORY:  [name] work for [employer] for about 40 years, retiring in 2008. She is divorced and lives alone, with no pets. Her daughter [name] works for [employer] in [employer]. The second daughter, [name], works as a Research scientist (physical sciences) for [employer]. The patient has 6 grandchildren and one great-grandchild. She attends a [employer].    ADVANCED DIRECTIVES: Not in place. The patient intends to name her daughter [name]. [name] is work number is [id number]. On the patient's 03/01/2014 visit she was given a copy of the appropriate documents to complete and notarize at her discretion   HEALTH MAINTENANCE: History  Substance Use Topics  . Smoking status: Never Smoker   . Smokeless tobacco: Not on file  . Alcohol Use: No     Colonoscopy: 2000  PAP: May 2015  Bone density: 05/23/2004, at [facility] hospital; normal  Lipid panel:  No Known Allergies  Current Outpatient Prescriptions  Medication Sig Dispense Refill  . acetaminophen (TYLENOL) 500 MG tablet Take 1,000 mg by mouth every 6 (six) hours as needed for mild pain or moderate pain.      Marland Kitchen dexamethasone (DECADRON) 4 MG tablet Take 2 tablets by mouth once a day on the day after chemotherapy and then take 2 tablets two times a day for 2 days. Take with food.  30 tablet  1  . lidocaine-prilocaine (EMLA) cream Apply 1 application topically once. Apply to port 1-2 hours prior to access.  30 g  1  . loteprednol (LOTEMAX) 0.5 % ophthalmic suspension Place 1 drop into both eyes 3 (three) times daily.      . ondansetron (ZOFRAN) 8 MG tablet Take 1 tablet (8 mg total) by mouth 2 (two) times daily as needed. Start on the third day after chemotherapy.  30 tablet  0  . potassium chloride (K-DUR) 10 MEQ tablet Take 1 tablet (10 mEq total) by mouth 2 (two) times daily.  60 tablet  4  . tobramycin-dexamethasone (TOBRADEX) ophthalmic  solution Place 1 drop into both eyes every 4 (four) hours while awake.  5 mL  0   No current facility-administered medications for this visit.    OBJECTIVE: Middle-aged [place] American woman who appears stated age 10 Vitals:   05/23/14 1038  BP: 144/94  Pulse: 106  Temp: 98.6 F (37 C)  Resp: 20     Body mass index is 45.33 kg/(m^2).    ECOG FS:1 - Symptomatic but completely ambulatory  Skin: warm, dry, nail beds on bilateral hands hyperpigmented around cuticle HEENT: sclerae erythematous, excessive tearing from right eye, conjunctivae pink, oropharynx clear. No thrush or mucositis.  Lymph Nodes: No cervical or supraclavicular lymphadenopathy  Lungs: clear to auscultation bilaterally, no rales, wheezes, or rhonci  Heart: regular rate and rhythm  Abdomen: round, soft, non tender, positive bowel sounds  Musculoskeletal: No focal spinal tenderness, no peripheral edema  Neuro: non focal, well oriented, positive affect  Breasts: deferred  LAB RESULTS:  I No results found for this basename: SPEP,  UPEP,  kappa and lambda light chains    Lab Results  Component Value Date   WBC 2.7* 05/23/2014   NEUTROABS 1.8 05/23/2014   HGB 9.2* 05/23/2014   HCT 28.4* 05/23/2014   MCV 90.4 05/23/2014   PLT 249 05/23/2014      Chemistry      Component Value Date/Time   NA 142 05/16/2014 1002   NA 143 04/24/2014 0958   K 3.0* 05/16/2014 1002   K 3.5 04/24/2014 0958   CL 105 04/24/2014 0958   CO2 22 05/16/2014 1002   CO2 25 04/24/2014 0958   BUN 7.6 05/16/2014 1002   BUN 12 04/24/2014 0958   CREATININE 0.8 05/16/2014 1002   CREATININE 0.93 04/24/2014 0958      Component Value Date/Time   CALCIUM 9.2 05/16/2014 1002   CALCIUM 9.3 04/24/2014 0958   ALKPHOS 95 05/16/2014 1002   ALKPHOS 121* 04/24/2014 0958   AST 18 05/16/2014 1002   AST 13 04/24/2014 0958   ALT 34 05/16/2014 1002   ALT 25 04/24/2014 0958   BILITOT 0.57 05/16/2014 1002   BILITOT 0.3 04/24/2014 0958       No results found for this basename: LABCA2     No components found with this basename: LABCA125    No results found for this basename: INR,  in the last 168 hours  Urinalysis    Component Value Date/Time   COLORURINE YELLOW 04/05/2008 1120   APPEARANCEUR CLEAR 04/05/2008 1120   LABSPEC 1.021 04/05/2008 1120   PHURINE 6.0 04/05/2008 1120   GLUCOSEU NEGATIVE 04/05/2008 1120   HGBUR NEGATIVE 04/05/2008 1120   BILIRUBINUR NEGATIVE 04/05/2008 1120   KETONESUR NEGATIVE 04/05/2008 1120   PROTEINUR NEGATIVE 04/05/2008 1120   UROBILINOGEN 0.2 04/05/2008 1120   NITRITE NEGATIVE 04/05/2008 1120   LEUKOCYTESUR NEGATIVE MICROSCOPIC NOT DONE ON URINES WITH NEGATIVE PROTEIN, BLOOD, LEUKOCYTES, NITRITE, OR GLUCOSE <1000 mg/dL. 04/05/2008 1120    STUDIES: No results found.  ASSESSMENT: 63 y.o. [place] woman s/p breast and left axillary lymph node biopsy 02/22/2014, both positive for a clinical T2 N2, stage IIIA invasive ductal carcinoma, grade 3, triple negative, with an MIB-1 of 90%  (1) neoadjuvant chemotherapy started 03/13/2014, with cyclophosphamide and doxorubicin in dose dense fashion x4, with Neulasta support on day 2, completed 04/24/2014, to be followed by weekly carboplatin and paclitaxel x12  (2) definitive surgery to follow chemotherapy  (3) radiation to follow surgery  (4) remote history of partial colectomy for early stage colon cancer incidentally found during TAH/BSO in the 1980s  (5) genetics testing since 04/03/2014, results pending  (6) staging studies showed right-sided lung nodules, too small to characterize, and a dominant right-sided thyroid nodule unchanged in size as compared to 2011; to be followed  PLAN: From a chemotherapy point of view, Cintya is doing well. She has been able to stop the oral dexamethasone at home with no increase in nausea. The CBC was reviewed in detail with her and was stable. The CMET was not yet available to review, but showed hypokalemia during her last visit. She was started on 25mq k-dur  tablets twice per day, and she will continue this until instructed otherwise. She will of course continue the eye drops and follow up with  her ophthalmologist.   LVaughan Bastawill proceed with day 1, cycle 3 of paclitaxel and carboplatin today. She will return next week for cycle 4. She understands and agrees with this plan. She has been encouraged to call with any issues that might arise  before her next visit here.   Marcelino Duster, NP   05/23/2014 10:55 AM

## 2014-05-23 NOTE — Patient Instructions (Signed)
[facility] Discharge Instructions for Patients Receiving Chemotherapy  Today you received the following chemotherapy agents: Taxol and carboplatin.  To help prevent nausea and vomiting after your treatment, we encourage you to take your nausea medication: Zofran 8 mg 2 times a day as  needed.   If you develop nausea and vomiting that is not controlled by your nausea medication, call the clinic.   BELOW ARE SYMPTOMS THAT SHOULD BE REPORTED IMMEDIATELY:  *FEVER GREATER THAN 100.5 F  *CHILLS WITH OR WITHOUT FEVER  NAUSEA AND VOMITING THAT IS NOT CONTROLLED WITH YOUR NAUSEA MEDICATION  *UNUSUAL SHORTNESS OF BREATH  *UNUSUAL BRUISING OR BLEEDING  TENDERNESS IN MOUTH AND THROAT WITH OR WITHOUT PRESENCE OF ULCERS  *URINARY PROBLEMS  *BOWEL PROBLEMS  UNUSUAL RASH Items with * indicate a potential emergency and should be followed up as soon as possible.  Feel free to call the clinic you have any questions or concerns. The clinic phone number is [phone].

## 2014-05-25 ENCOUNTER — Encounter: Payer: Medicare Other | Admitting: Physical Therapy

## 2014-05-25 DIAGNOSIS — H402290 Chronic angle-closure glaucoma, unspecified eye, stage unspecified: Secondary | ICD-10-CM | POA: Diagnosis not present

## 2014-05-25 DIAGNOSIS — H2510 Age-related nuclear cataract, unspecified eye: Secondary | ICD-10-CM | POA: Diagnosis not present

## 2014-05-29 ENCOUNTER — Ambulatory Visit: Payer: Medicare Other

## 2014-05-29 ENCOUNTER — Encounter: Payer: Self-pay | Admitting: Nurse Practitioner

## 2014-05-29 ENCOUNTER — Ambulatory Visit (HOSPITAL_BASED_OUTPATIENT_CLINIC_OR_DEPARTMENT_OTHER): Payer: Medicare Other | Admitting: *Deleted

## 2014-05-29 ENCOUNTER — Other Ambulatory Visit: Payer: Self-pay | Admitting: *Deleted

## 2014-05-29 ENCOUNTER — Other Ambulatory Visit: Payer: Self-pay | Admitting: Oncology

## 2014-05-29 ENCOUNTER — Ambulatory Visit (HOSPITAL_BASED_OUTPATIENT_CLINIC_OR_DEPARTMENT_OTHER): Payer: Medicare Other

## 2014-05-29 ENCOUNTER — Ambulatory Visit (HOSPITAL_BASED_OUTPATIENT_CLINIC_OR_DEPARTMENT_OTHER): Payer: Medicare Other | Admitting: Nurse Practitioner

## 2014-05-29 VITALS — BP 117/80 | HR 109 | Temp 98.1°F | Resp 20 | Ht 61.0 in | Wt 237.3 lb

## 2014-05-29 DIAGNOSIS — C773 Secondary and unspecified malignant neoplasm of axilla and upper limb lymph nodes: Secondary | ICD-10-CM | POA: Diagnosis not present

## 2014-05-29 DIAGNOSIS — C50412 Malignant neoplasm of upper-outer quadrant of left female breast: Secondary | ICD-10-CM

## 2014-05-29 DIAGNOSIS — C50919 Malignant neoplasm of unspecified site of unspecified female breast: Secondary | ICD-10-CM

## 2014-05-29 DIAGNOSIS — H04209 Unspecified epiphora, unspecified lacrimal gland: Secondary | ICD-10-CM

## 2014-05-29 DIAGNOSIS — E876 Hypokalemia: Secondary | ICD-10-CM | POA: Diagnosis not present

## 2014-05-29 DIAGNOSIS — H04203 Unspecified epiphora, bilateral lacrimal glands: Secondary | ICD-10-CM

## 2014-05-29 DIAGNOSIS — C189 Malignant neoplasm of colon, unspecified: Secondary | ICD-10-CM

## 2014-05-29 DIAGNOSIS — Z5111 Encounter for antineoplastic chemotherapy: Secondary | ICD-10-CM

## 2014-05-29 DIAGNOSIS — Z95828 Presence of other vascular implants and grafts: Secondary | ICD-10-CM

## 2014-05-29 DIAGNOSIS — Z171 Estrogen receptor negative status [ER-]: Secondary | ICD-10-CM

## 2014-05-29 LAB — CBC WITH DIFFERENTIAL/PLATELET
BASO%: 2.9 % — AB (ref 0.0–2.0)
Basophils Absolute: 0.1 10*3/uL (ref 0.0–0.1)
EOS%: 3.1 % (ref 0.0–7.0)
Eosinophils Absolute: 0.1 10*3/uL (ref 0.0–0.5)
HCT: 28.6 % — ABNORMAL LOW (ref 34.8–46.6)
HEMOGLOBIN: 9.3 g/dL — AB (ref 11.6–15.9)
LYMPH#: 0.3 10*3/uL — AB (ref 0.9–3.3)
LYMPH%: 17.4 % (ref 14.0–49.7)
MCH: 30.3 pg (ref 25.1–34.0)
MCHC: 32.5 g/dL (ref 31.5–36.0)
MCV: 93.1 fL (ref 79.5–101.0)
MONO#: 0.2 10*3/uL (ref 0.1–0.9)
MONO%: 10.1 % (ref 0.0–14.0)
NEUT#: 1.3 10*3/uL — ABNORMAL LOW (ref 1.5–6.5)
NEUT%: 66.5 % (ref 38.4–76.8)
Platelets: 255 10*3/uL (ref 145–400)
RBC: 3.07 10*6/uL — ABNORMAL LOW (ref 3.70–5.45)
RDW: 19.6 % — ABNORMAL HIGH (ref 11.2–14.5)
WBC: 2 10*3/uL — ABNORMAL LOW (ref 3.9–10.3)

## 2014-05-29 LAB — COMPREHENSIVE METABOLIC PANEL (CC13)
ALT: 50 U/L (ref 0–55)
ANION GAP: 10 meq/L (ref 3–11)
AST: 28 U/L (ref 5–34)
Albumin: 3.4 g/dL — ABNORMAL LOW (ref 3.5–5.0)
Alkaline Phosphatase: 108 U/L (ref 40–150)
BUN: 9 mg/dL (ref 7.0–26.0)
CALCIUM: 9.2 mg/dL (ref 8.4–10.4)
CHLORIDE: 110 meq/L — AB (ref 98–109)
CO2: 21 mEq/L — ABNORMAL LOW (ref 22–29)
CREATININE: 0.8 mg/dL (ref 0.6–1.1)
Glucose: 129 mg/dl (ref 70–140)
Potassium: 3.7 mEq/L (ref 3.5–5.1)
Sodium: 141 mEq/L (ref 136–145)
TOTAL PROTEIN: 6.7 g/dL (ref 6.4–8.3)
Total Bilirubin: 1.04 mg/dL (ref 0.20–1.20)

## 2014-05-29 MED ORDER — ONDANSETRON 16 MG/50ML IVPB (CHCC)
16.0000 mg | Freq: Once | INTRAVENOUS | Status: AC
  Administered 2014-05-29: 16 mg via INTRAVENOUS

## 2014-05-29 MED ORDER — SODIUM CHLORIDE 0.9 % IJ SOLN
10.0000 mL | INTRAMUSCULAR | Status: DC | PRN
  Administered 2014-05-29: 10 mL via INTRAVENOUS
  Filled 2014-05-29: qty 10

## 2014-05-29 MED ORDER — SODIUM CHLORIDE 0.9 % IV SOLN
Freq: Once | INTRAVENOUS | Status: AC
  Administered 2014-05-29: 11:00:00 via INTRAVENOUS

## 2014-05-29 MED ORDER — FAMOTIDINE IN NACL 20-0.9 MG/50ML-% IV SOLN
20.0000 mg | Freq: Once | INTRAVENOUS | Status: AC
  Administered 2014-05-29: 20 mg via INTRAVENOUS

## 2014-05-29 MED ORDER — FAMOTIDINE IN NACL 20-0.9 MG/50ML-% IV SOLN
INTRAVENOUS | Status: AC
  Filled 2014-05-29: qty 50

## 2014-05-29 MED ORDER — DIPHENHYDRAMINE HCL 50 MG/ML IJ SOLN
INTRAMUSCULAR | Status: AC
  Filled 2014-05-29: qty 1

## 2014-05-29 MED ORDER — ONDANSETRON HCL 8 MG PO TABS: 8.0000 mg | ORAL_TABLET | Freq: Two times a day (BID) | ORAL | Status: DC | PRN

## 2014-05-29 MED ORDER — DEXAMETHASONE SODIUM PHOSPHATE 20 MG/5ML IJ SOLN
INTRAMUSCULAR | Status: AC
  Filled 2014-05-29: qty 5

## 2014-05-29 MED ORDER — DEXAMETHASONE SODIUM PHOSPHATE 20 MG/5ML IJ SOLN
20.0000 mg | Freq: Once | INTRAMUSCULAR | Status: AC
  Administered 2014-05-29: 20 mg via INTRAVENOUS

## 2014-05-29 MED ORDER — SODIUM CHLORIDE 0.9 % IJ SOLN
10.0000 mL | INTRAMUSCULAR | Status: DC | PRN
  Administered 2014-05-29: 10 mL
  Filled 2014-05-29: qty 10

## 2014-05-29 MED ORDER — HEPARIN SOD (PORK) LOCK FLUSH 100 UNIT/ML IV SOLN
500.0000 [IU] | Freq: Once | INTRAVENOUS | Status: AC | PRN
  Administered 2014-05-29: 500 [IU]
  Filled 2014-05-29: qty 5

## 2014-05-29 MED ORDER — SODIUM CHLORIDE 0.9 % IV SOLN
267.8000 mg | Freq: Once | INTRAVENOUS | Status: AC
  Administered 2014-05-29: 270 mg via INTRAVENOUS
  Filled 2014-05-29: qty 27

## 2014-05-29 MED ORDER — DIPHENHYDRAMINE HCL 50 MG/ML IJ SOLN
25.0000 mg | Freq: Once | INTRAMUSCULAR | Status: AC
  Administered 2014-05-29: 25 mg via INTRAVENOUS

## 2014-05-29 MED ORDER — PACLITAXEL CHEMO INJECTION 300 MG/50ML
80.0000 mg/m2 | Freq: Once | INTRAVENOUS | Status: AC
  Administered 2014-05-29: 174 mg via INTRAVENOUS
  Filled 2014-05-29: qty 29

## 2014-05-29 MED ORDER — ONDANSETRON 16 MG/50ML IVPB (CHCC)
INTRAVENOUS | Status: AC
  Filled 2014-05-29: qty 16

## 2014-05-29 NOTE — Progress Notes (Signed)
[place]  [phone] Fax:(336) [phone]     ID: [name] DOB: [birth year]  MR#: [id number]  FXO#:[id number]  PCP: Maximino Greenland, MD GYN: SU: Rolm Bookbinder OTHER MD: Gery Pray  CHIEF COMPLAINT: Stage III breast cancer, triple negative   CURRENT TREATMENT: Neoadjuvant chemotherapy  BREAST CANCER HISTORY: From the original intake note:  [name] herself palpated a mass in her left breast, and immediately brought it to the attention of her primary care physician, Dr. Baird Cancer, who confirmed the finding and setup the patient for bilateral diagnostic mammography at the breast Center 02/20/2014. This showed a high density mass in the outer left breast measuring up to 3.7 cm. It corresponded to the site of palpable concern. In addition the normal 4 logically abnormal lymph nodes in the left axilla. The mass was palpable by exam, and by ultrasonography measured 2.6 cm. There were enlarged and morphologically abnormal lymph nodes in the left axilla, largest measuring 3 cm.  Biopsy of the left breast mass in question and 1 of the abnormal axillary lymph nodes 02/22/2014 showed ([id number]) biopsies to be positive for an invasive ductal carcinoma, grade 3, triple negative, with an MIB-1 of 90%. Specifically the HER-2 signals ratio was 1.05, and the number per cell 2.00.  MRI of the breasts 03/01/2014 showed a 3.8 cm enhancing mass with areas of apparent necrosis in the left breast, as well as the biopsy tract extending laterally, the combined measure 5.3 cm. There were no other masses or areas of abnormal enhancement. There were multiple abnormally enlarged left axillary lymph nodes with loss of the normal fatty hilum. These included level I and retropectoral lymph nodes.  The patient's subsequent history is as detailed below   INTERVAL HISTORY: [name] returns today for follow up of her breast cancer. Today is day 1, cycle 4 of 12 planned weekly cycles of  paclitaxel and carboplatin. She is tolerating the chemo well, but is starting to feel fleeting numbness to her fingertips. This is not present all of the time, and she only noticed this symptom this past week. Her excessive tearing has almost completely resolved, and she is continuing the lotenpredol drops at this time.   REVIEW OF SYSTEMS: [name] denies fevers, chills, nausea, vomiting, or changes in bowel or bladder habits. She continues to maintain a good appetite with no mouth sores. She denies shortness of breath, chest pain, cough, or fatigue. A detailed review of systems was otherwise negative.   PAST MEDICAL HISTORY: Past Medical History  Diagnosis Date  . Colon cancer   . Hx of rotator cuff surgery     PAST SURGICAL HISTORY: Past Surgical History  Procedure Laterality Date  . Abdominal hysterectomy    . Colon surgery      colectomy/colostomy with takedown    FAMILY HISTORY Family History  Problem Relation Age of Onset  . Cancer Mother 57    breast  . Cancer Maternal Aunt     breast cancer at unknown age   the patient's father died at the age of 51 following a stroke in the setting of diabetes; the patient's mother is living at 78. The patient has 3 brothers and 2 sisters. [name], [name], was diagnosed with breast cancer in her early 3s. There is no other breast or ovarian cancer in the family. The patient herself was diagnosed with watts by history he is an incidentally found stage I colon carcinoma noted at the time of her hysterectomy. She had  a partial colectomy but no adjuvant treatment. We have no records regarding that procedure  GYNECOLOGIC HISTORY:  No LMP recorded. Patient has had a hysterectomy. Menarche age 59, first live birth age 34, the patient underwent total abdominal hysterectomy with bilateral salpingo-oophorectomy in her 31s. She did not take hormone replacement. She did not take oral contraceptives at any point.  SOCIAL HISTORY:  [name]  work for [employer] for about 40 years, retiring in 2008. She is divorced and lives alone, with no pets. Her daughter [name] works for [employer] in [employer]. The second daughter, [name], works as a Research scientist (physical sciences) for [employer]. The patient has 6 grandchildren and one great-grandchild. She attends a [employer].    ADVANCED DIRECTIVES: Not in place. The patient intends to name her daughter [name]. [name] is work number is [id number]. On the patient's 03/01/2014 visit she was given a copy of the appropriate documents to complete and notarize at her discretion   HEALTH MAINTENANCE: History  Substance Use Topics  . Smoking status: Never Smoker   . Smokeless tobacco: Not on file  . Alcohol Use: No     Colonoscopy: 2000  PAP: May 2015  Bone density: 05/23/2004, at [facility] hospital; normal  Lipid panel:  No Known Allergies  Current Outpatient Prescriptions  Medication Sig Dispense Refill  . lidocaine-prilocaine (EMLA) cream Apply 1 application topically once. Apply to port 1-2 hours prior to access.  30 g  1  . loteprednol (LOTEMAX) 0.5 % ophthalmic suspension Place 1 drop into both eyes 3 (three) times daily.      . ondansetron (ZOFRAN) 8 MG tablet Take 1 tablet (8 mg total) by mouth 2 (two) times daily as needed. Start on the third day after chemotherapy.  30 tablet  0  . potassium chloride (K-DUR) 10 MEQ tablet Take 1 tablet (10 mEq total) by mouth 2 (two) times daily.  60 tablet  4  . tobramycin-dexamethasone (TOBRADEX) ophthalmic solution Place 1 drop into both eyes every 4 (four) hours while awake.  5 mL  0  . acetaminophen (TYLENOL) 500 MG tablet Take 1,000 mg by mouth every 6 (six) hours as needed for mild pain or moderate pain.      Marland Kitchen dexamethasone (DECADRON) 4 MG tablet Take 2 tablets by mouth once a day on the day after chemotherapy and then take 2 tablets two times a day for 2 days. Take with food.  30 tablet  1   No current  facility-administered medications for this visit.    OBJECTIVE: Middle-aged [place] American woman who appears stated age 65 Vitals:   05/29/14 1015  BP: 117/80  Pulse: 109  Temp: 98.1 F (36.7 C)  Resp: 20     Body mass index is 44.86 kg/(m^2).    ECOG FS:1 - Symptomatic but completely ambulatory  Skin clear, dry, nail beds and bilateral palms hyperpigmented Sclerae unicteric, pupils equal and reactive Oropharynx clear and moist-- no thrush No cervical or supraclavicular adenopathy Lungs no rales or rhonchi Heart regular rate and rhythm Abd soft, nontender, positive bowel sounds MSK no focal spinal tenderness, no upper extremity lymphedema Neuro: nonfocal, well oriented, appropriate affect Breasts: deferred  LAB RESULTS:  I No results found for this basename: SPEP,  UPEP,   kappa and lambda light chains    Lab Results  Component Value Date   WBC 2.0* 05/29/2014   NEUTROABS 1.3* 05/29/2014   HGB 9.3* 05/29/2014   HCT 28.6* 05/29/2014  MCV 93.1 05/29/2014   PLT 255 05/29/2014      Chemistry      Component Value Date/Time   NA 143 05/23/2014 1017   NA 143 04/24/2014 0958   K 3.6 05/23/2014 1017   K 3.5 04/24/2014 0958   CL 105 04/24/2014 0958   CO2 25 05/23/2014 1017   CO2 25 04/24/2014 0958   BUN 5.7* 05/23/2014 1017   BUN 12 04/24/2014 0958   CREATININE 0.8 05/23/2014 1017   CREATININE 0.93 04/24/2014 0958      Component Value Date/Time   CALCIUM 9.4 05/23/2014 1017   CALCIUM 9.3 04/24/2014 0958   ALKPHOS 99 05/23/2014 1017   ALKPHOS 121* 04/24/2014 0958   AST 26 05/23/2014 1017   AST 13 04/24/2014 0958   ALT 46 05/23/2014 1017   ALT 25 04/24/2014 0958   BILITOT 0.86 05/23/2014 1017   BILITOT 0.3 04/24/2014 0958       No results found for this basename: LABCA2    No components found with this basename: [id number]    No results found for this basename: INR,  in the last 168 hours  Urinalysis    Component Value Date/Time   COLORURINE YELLOW 04/05/2008 1120   APPEARANCEUR  CLEAR 04/05/2008 1120   LABSPEC 1.021 04/05/2008 1120   PHURINE 6.0 04/05/2008 1120   GLUCOSEU NEGATIVE 04/05/2008 1120   HGBUR NEGATIVE 04/05/2008 1120   BILIRUBINUR NEGATIVE 04/05/2008 1120   KETONESUR NEGATIVE 04/05/2008 1120   PROTEINUR NEGATIVE 04/05/2008 1120   UROBILINOGEN 0.2 04/05/2008 1120   NITRITE NEGATIVE 04/05/2008 1120   LEUKOCYTESUR NEGATIVE MICROSCOPIC NOT DONE ON URINES WITH NEGATIVE PROTEIN, BLOOD, LEUKOCYTES, NITRITE, OR GLUCOSE <1000 mg/dL. 04/05/2008 1120    STUDIES: No results found.  ASSESSMENT: 63 y.o. [place] woman s/p breast and left axillary lymph node biopsy 02/22/2014, both positive for a clinical T2 N2, stage IIIA invasive ductal carcinoma, grade 3, triple negative, with an MIB-1 of 90%  (1) neoadjuvant chemotherapy started 03/13/2014, with cyclophosphamide and doxorubicin in dose dense fashion x4, with Neulasta support on day 2, completed 04/24/2014, to be followed by weekly carboplatin and paclitaxel x12  (2) definitive surgery to follow chemotherapy  (3) radiation to follow surgery  (4) remote history of partial colectomy for early stage colon cancer incidentally found during TAH/BSO in the 1980s  (5) genetics testing since 04/03/2014, results pending  (6) staging studies showed right-sided lung nodules, too small to characterize, and a dominant right-sided thyroid nodule unchanged in size as compared to 2011; to be followed  PLAN: Delara is tolerating her chemo very well. The CBC was reviewed in detail with her and was stable. The CMET was not yet available to review, but last week's result showed the resolution of her hypokalemia. She will continue the 59mq k-dur tablets twice per day. As stated above she will continue her eye drops until she follows up with her ophthalmologist on 10/8  LJabreewill proceed with day 1, cycle 4 of paclitaxel and carboplatin today. She will return next week for cycle 5. She understands and agrees with this plan. She has been  encouraged to call with any issues that might arise before her next visit here.    FMarcelino Duster NP   05/29/2014 10:36 AM

## 2014-05-29 NOTE — Progress Notes (Signed)
Okay to proceed with taxol/carboplatin treatment today with ANC 1.3 per Susanne Borders, NP.

## 2014-05-29 NOTE — Patient Instructions (Signed)
[facility] Discharge Instructions for Patients Receiving Chemotherapy  Today you received the following chemotherapy agents: taxol, carboplatin   To help prevent nausea and vomiting after your treatment, we encourage you to take your nausea medication as prescribed.    If you develop nausea and vomiting that is not controlled by your nausea medication, call the clinic.   BELOW ARE SYMPTOMS THAT SHOULD BE REPORTED IMMEDIATELY:  *FEVER GREATER THAN 100.5 F  *CHILLS WITH OR WITHOUT FEVER  NAUSEA AND VOMITING THAT IS NOT CONTROLLED WITH YOUR NAUSEA MEDICATION  *UNUSUAL SHORTNESS OF BREATH  *UNUSUAL BRUISING OR BLEEDING  TENDERNESS IN MOUTH AND THROAT WITH OR WITHOUT PRESENCE OF ULCERS  *URINARY PROBLEMS  *BOWEL PROBLEMS  UNUSUAL RASH Items with * indicate a potential emergency and should be followed up as soon as possible.  Feel free to call the clinic you have any questions or concerns. The clinic phone number is [phone].    

## 2014-05-29 NOTE — Patient Instructions (Signed)

## 2014-05-29 NOTE — Progress Notes (Signed)
Patient to let city [place] know her bill will not be paid by 06/09/14. [facility] is not responsible for any disconnection. She said she will call them this afternoon to advise.

## 2014-05-30 ENCOUNTER — Ambulatory Visit: Payer: Medicare Other | Admitting: Nurse Practitioner

## 2014-05-30 ENCOUNTER — Ambulatory Visit: Payer: Medicare Other

## 2014-05-30 ENCOUNTER — Other Ambulatory Visit: Payer: Medicare Other

## 2014-05-31 ENCOUNTER — Telehealth: Payer: Self-pay | Admitting: Oncology

## 2014-05-31 NOTE — Telephone Encounter (Signed)
PER 9/14 POF ADDED FLUSH APPTS TO LB/FU VISITS. S/W PT SHE IS AWARE AND WILL GET NEW SCHEDULE AT 9/21 APPT.

## 2014-06-05 ENCOUNTER — Encounter: Payer: Self-pay | Admitting: Nurse Practitioner

## 2014-06-05 ENCOUNTER — Ambulatory Visit (HOSPITAL_BASED_OUTPATIENT_CLINIC_OR_DEPARTMENT_OTHER): Payer: Medicare Other | Admitting: Nurse Practitioner

## 2014-06-05 ENCOUNTER — Ambulatory Visit (HOSPITAL_BASED_OUTPATIENT_CLINIC_OR_DEPARTMENT_OTHER): Payer: Medicare Other

## 2014-06-05 ENCOUNTER — Ambulatory Visit: Payer: Medicare Other

## 2014-06-05 ENCOUNTER — Other Ambulatory Visit (HOSPITAL_BASED_OUTPATIENT_CLINIC_OR_DEPARTMENT_OTHER): Payer: Medicare Other

## 2014-06-05 VITALS — BP 125/82 | HR 114 | Temp 98.7°F | Resp 18 | Ht 61.0 in | Wt 236.8 lb

## 2014-06-05 DIAGNOSIS — C773 Secondary and unspecified malignant neoplasm of axilla and upper limb lymph nodes: Secondary | ICD-10-CM | POA: Diagnosis not present

## 2014-06-05 DIAGNOSIS — E876 Hypokalemia: Secondary | ICD-10-CM

## 2014-06-05 DIAGNOSIS — C50919 Malignant neoplasm of unspecified site of unspecified female breast: Secondary | ICD-10-CM | POA: Diagnosis not present

## 2014-06-05 DIAGNOSIS — C50412 Malignant neoplasm of upper-outer quadrant of left female breast: Secondary | ICD-10-CM

## 2014-06-05 DIAGNOSIS — G609 Hereditary and idiopathic neuropathy, unspecified: Secondary | ICD-10-CM

## 2014-06-05 DIAGNOSIS — Z95828 Presence of other vascular implants and grafts: Secondary | ICD-10-CM

## 2014-06-05 LAB — COMPREHENSIVE METABOLIC PANEL (CC13)
ALBUMIN: 3.3 g/dL — AB (ref 3.5–5.0)
ALT: 47 U/L (ref 0–55)
AST: 26 U/L (ref 5–34)
Alkaline Phosphatase: 112 U/L (ref 40–150)
Anion Gap: 12 mEq/L — ABNORMAL HIGH (ref 3–11)
BUN: 6.5 mg/dL — AB (ref 7.0–26.0)
CHLORIDE: 108 meq/L (ref 98–109)
CO2: 21 mEq/L — ABNORMAL LOW (ref 22–29)
Calcium: 9.2 mg/dL (ref 8.4–10.4)
Creatinine: 0.8 mg/dL (ref 0.6–1.1)
Glucose: 116 mg/dl (ref 70–140)
Potassium: 3.1 mEq/L — ABNORMAL LOW (ref 3.5–5.1)
Sodium: 141 mEq/L (ref 136–145)
Total Bilirubin: 1.12 mg/dL (ref 0.20–1.20)
Total Protein: 6.5 g/dL (ref 6.4–8.3)

## 2014-06-05 LAB — CBC WITH DIFFERENTIAL/PLATELET
BASO%: 1 % (ref 0.0–2.0)
Basophils Absolute: 0 10*3/uL (ref 0.0–0.1)
EOS%: 0.5 % (ref 0.0–7.0)
Eosinophils Absolute: 0 10*3/uL (ref 0.0–0.5)
HEMATOCRIT: 27.1 % — AB (ref 34.8–46.6)
HGB: 8.9 g/dL — ABNORMAL LOW (ref 11.6–15.9)
LYMPH#: 0.4 10*3/uL — AB (ref 0.9–3.3)
LYMPH%: 20 % (ref 14.0–49.7)
MCH: 30.2 pg (ref 25.1–34.0)
MCHC: 32.8 g/dL (ref 31.5–36.0)
MCV: 91.9 fL (ref 79.5–101.0)
MONO#: 0.3 10*3/uL (ref 0.1–0.9)
MONO%: 15 % — ABNORMAL HIGH (ref 0.0–14.0)
NEUT#: 1.3 10*3/uL — ABNORMAL LOW (ref 1.5–6.5)
NEUT%: 63.5 % (ref 38.4–76.8)
Platelets: 210 10*3/uL (ref 145–400)
RBC: 2.95 10*6/uL — ABNORMAL LOW (ref 3.70–5.45)
RDW: 19.2 % — ABNORMAL HIGH (ref 11.2–14.5)
WBC: 2 10*3/uL — AB (ref 3.9–10.3)
nRBC: 7 % — ABNORMAL HIGH (ref 0–0)

## 2014-06-05 MED ORDER — SODIUM CHLORIDE 0.9 % IJ SOLN
10.0000 mL | INTRAMUSCULAR | Status: DC | PRN
  Administered 2014-06-05: 10 mL via INTRAVENOUS
  Filled 2014-06-05: qty 10

## 2014-06-05 NOTE — Progress Notes (Signed)
[name]  Telephone:(336) [phone] Fax:(336) [phone]     ID: [name] DOB: 1951-03-01  MR#: [id number]  GUY#:[id number]  PCP: Maximino Greenland, MD GYN: SU: Rolm Bookbinder OTHER MD: Gery Pray  CHIEF COMPLAINT: Stage III breast cancer, triple negative   CURRENT TREATMENT: Neoadjuvant chemotherapy  BREAST CANCER HISTORY: From the original intake note:  [name] herself palpated a mass in her left breast, and immediately brought it to the attention of her primary care physician, Dr. Baird Cancer, who confirmed the finding and setup the patient for bilateral diagnostic mammography at the breast Center 02/20/2014. This showed a high density mass in the outer left breast measuring up to 3.7 cm. It corresponded to the site of palpable concern. In addition the normal 4 logically abnormal lymph nodes in the left axilla. The mass was palpable by exam, and by ultrasonography measured 2.6 cm. There were enlarged and morphologically abnormal lymph nodes in the left axilla, largest measuring 3 cm.  Biopsy of the left breast mass in question and 1 of the abnormal axillary lymph nodes 02/22/2014 showed ([id number]) biopsies to be positive for an invasive ductal carcinoma, grade 3, triple negative, with an MIB-1 of 90%. Specifically the HER-2 signals ratio was 1.05, and the number per cell 2.00.  MRI of the breasts 03/01/2014 showed a 3.8 cm enhancing mass with areas of apparent necrosis in the left breast, as well as the biopsy tract extending laterally, the combined measure 5.3 cm. There were no other masses or areas of abnormal enhancement. There were multiple abnormally enlarged left axillary lymph nodes with loss of the normal fatty hilum. These included level I and retropectoral lymph nodes.  The patient's subsequent history is as detailed below   INTERVAL HISTORY: [name] returns today for follow up of her breast cancer. Today is day 1, cycle 5 of 12 planned weekly cycles of  paclitaxel and carboplatin. [name]'s numbness to her fingertips has worsened. She is having difficulty buttoning her shirts and grabbing her wash cloth for the past week. Previously her numbness was fleeting and was not persistent.    REVIEW OF SYSTEMS: [name] denies fevers, chills, nausea, vomiting, or changes in bowel or bladder habits. She continues to maintain a good appetite with no mouth sores. Her palms and nail beds are hyperpigmented. Her scalp is dry and she has been moisturizing it with coconut oil. She denies shortness of breath, chest pain, cough, or fatigue. A detailed review of systems was otherwise negative.   PAST MEDICAL HISTORY: Past Medical History  Diagnosis Date  . Colon cancer   . Hx of rotator cuff surgery     PAST SURGICAL HISTORY: Past Surgical History  Procedure Laterality Date  . Abdominal hysterectomy    . Colon surgery      colectomy/colostomy with takedown    FAMILY HISTORY Family History  Problem Relation Age of Onset  . Cancer Mother 62    breast  . Cancer Maternal Aunt     breast cancer at unknown age   the patient's father died at the age of 4 following a stroke in the setting of diabetes; the patient's mother is living at 82. The patient has 3 brothers and 2 sisters. [name], [name], was diagnosed with breast cancer in her early 95s. There is no other breast or ovarian cancer in the family. The patient herself was diagnosed with watts by history he is an incidentally found stage I colon carcinoma noted at the time of her  hysterectomy. She had a partial colectomy but no adjuvant treatment. We have no records regarding that procedure  GYNECOLOGIC HISTORY:  No LMP recorded. Patient has had a hysterectomy. Menarche age 63, first live birth age 63, the patient underwent total abdominal hysterectomy with bilateral salpingo-oophorectomy in her 63s. She did not take hormone replacement. She did not take oral contraceptives at any  point.  SOCIAL HISTORY:  [name] work for [employer] for about 40 years, retiring in 2008. She is divorced and lives alone, with no pets. Her daughter [name] works for [employer] in [employer]. The second daughter, [name], works as a Research scientist (physical sciences) for [employer]. The patient has 6 grandchildren and one great-grandchild. She attends a [employer].    ADVANCED DIRECTIVES: Not in place. The patient intends to name her daughter [name]. [name] is work number is [id number]. On the patient's 03/01/2014 visit she was given a copy of the appropriate documents to complete and notarize at her discretion   HEALTH MAINTENANCE: History  Substance Use Topics  . Smoking status: Never Smoker   . Smokeless tobacco: Not on file  . Alcohol Use: No     Colonoscopy: 2000  PAP: May 2015  Bone density: 05/23/2004, at [facility] hospital; normal  Lipid panel:  No Known Allergies  Current Outpatient Prescriptions  Medication Sig Dispense Refill  . dexamethasone (DECADRON) 4 MG tablet Take 2 tablets by mouth once a day on the day after chemotherapy and then take 2 tablets two times a day for 2 days. Take with food.  30 tablet  1  . lidocaine-prilocaine (EMLA) cream Apply 1 application topically once. Apply to port 1-2 hours prior to access.  30 g  1  . loteprednol (LOTEMAX) 0.5 % ophthalmic suspension Place 1 drop into both eyes 3 (three) times daily.      . ondansetron (ZOFRAN) 8 MG tablet Take 1 tablet (8 mg total) by mouth 2 (two) times daily as needed. Start on the third day after chemotherapy.  30 tablet  0  . potassium chloride (K-DUR) 10 MEQ tablet Take 1 tablet (10 mEq total) by mouth 2 (two) times daily.  60 tablet  4  . tobramycin-dexamethasone (TOBRADEX) ophthalmic solution Place 1 drop into both eyes every 4 (four) hours while awake.  5 mL  0  . acetaminophen (TYLENOL) 500 MG tablet Take 1,000 mg by mouth every 6 (six) hours as needed for mild pain or  moderate pain.       No current facility-administered medications for this visit.    OBJECTIVE: Middle-aged [place] American woman who appears stated age 63 Vitals:   06/05/14 0952  BP: 125/82  Pulse: 114  Temp: 98.7 F (37.1 C)  Resp: 18     Body mass index is 44.77 kg/(m^2).    ECOG FS:1 - Symptomatic but completely ambulatory    Skin: warm, dry, nail beds and bilateral palms hyperpigmented  HEENT: sclerae anicteric, conjunctivae pink, oropharynx clear. No thrush or mucositis. Scalp dry and flaky Lymph Nodes: No cervical or supraclavicular lymphadenopathy  Lungs: clear to auscultation bilaterally, no rales, wheezes, or rhonci  Heart: regular rate and rhythm  Abdomen: round, soft, non tender, positive bowel sounds  Musculoskeletal: No focal spinal tenderness, no peripheral edema  Neuro: non focal, well oriented, positive affect  Breasts: deferred  LAB RESULTS:  I No results found for this basename: SPEP,  UPEP,   kappa and lambda light chains    Lab Results  Component Value Date   WBC 2.0* 06/05/2014   NEUTROABS 1.3* 06/05/2014   HGB 8.9* 06/05/2014   HCT 27.1* 06/05/2014   MCV 91.9 06/05/2014   PLT 210 06/05/2014      Chemistry      Component Value Date/Time   NA 141 06/05/2014 0911   NA 143 04/24/2014 0958   K 3.1* 06/05/2014 0911   K 3.5 04/24/2014 0958   CL 105 04/24/2014 0958   CO2 21* 06/05/2014 0911   CO2 25 04/24/2014 0958   BUN 6.5* 06/05/2014 0911   BUN 12 04/24/2014 0958   CREATININE 0.8 06/05/2014 0911   CREATININE 0.93 04/24/2014 0958      Component Value Date/Time   CALCIUM 9.2 06/05/2014 0911   CALCIUM 9.3 04/24/2014 0958   ALKPHOS 112 06/05/2014 0911   ALKPHOS 121* 04/24/2014 0958   AST 26 06/05/2014 0911   AST 13 04/24/2014 0958   ALT 47 06/05/2014 0911   ALT 25 04/24/2014 0958   BILITOT 1.12 06/05/2014 0911   BILITOT 0.3 04/24/2014 0958       No results found for this basename: LABCA2    No components found with this basename: [id number]    No  results found for this basename: INR,  in the last 168 hours  Urinalysis    Component Value Date/Time   COLORURINE YELLOW 04/05/2008 1120   APPEARANCEUR CLEAR 04/05/2008 1120   LABSPEC 1.021 04/05/2008 1120   PHURINE 6.0 04/05/2008 1120   GLUCOSEU NEGATIVE 04/05/2008 1120   HGBUR NEGATIVE 04/05/2008 1120   BILIRUBINUR NEGATIVE 04/05/2008 1120   KETONESUR NEGATIVE 04/05/2008 1120   PROTEINUR NEGATIVE 04/05/2008 1120   UROBILINOGEN 0.2 04/05/2008 1120   NITRITE NEGATIVE 04/05/2008 1120   LEUKOCYTESUR NEGATIVE MICROSCOPIC NOT DONE ON URINES WITH NEGATIVE PROTEIN, BLOOD, LEUKOCYTES, NITRITE, OR GLUCOSE <1000 mg/dL. 04/05/2008 1120    STUDIES: No results found.  ASSESSMENT: 63 y.o. [place] woman s/p breast and left axillary lymph node biopsy 02/22/2014, both positive for a clinical T2 N2, stage IIIA invasive ductal carcinoma, grade 3, triple negative, with an MIB-1 of 90%  (1) neoadjuvant chemotherapy started 03/13/2014, with cyclophosphamide and doxorubicin in dose dense fashion x4, with Neulasta support on day 2, completed 04/24/2014, to be followed by weekly carboplatin and paclitaxel x12  (2) definitive surgery to follow chemotherapy  (3) radiation to follow surgery  (4) remote history of partial colectomy for early stage colon cancer incidentally found during TAH/BSO in the 1980s  (5) genetics testing since 04/03/2014, results pending  (6) staging studies showed right-sided lung nodules, too small to characterize, and a dominant right-sided thyroid nodule unchanged in size as compared to 2011; to be followed  PLAN: Oline feels well today. The labs were reviewed in detail and her potassium is down to 3.1. She will continue the 10 meq K-dur tablets twice per day. 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 Next week we will reduce the paclitaxel by 20%. If the neuropathy continues we may switch to gemcitabine.    Chizuko understands and agrees with this plan. She knows the goal of treatment in her case is cure. She has been encouraged to call with any issues that might arise before her next visit here.   Marcelino Duster, NP   06/05/2014 10:30 AM

## 2014-06-05 NOTE — Patient Instructions (Signed)

## 2014-06-06 ENCOUNTER — Ambulatory Visit: Payer: Medicare Other

## 2014-06-11 ENCOUNTER — Encounter: Payer: Self-pay | Admitting: Genetic Counselor

## 2014-06-11 DIAGNOSIS — C189 Malignant neoplasm of colon, unspecified: Secondary | ICD-10-CM

## 2014-06-11 DIAGNOSIS — Z803 Family history of malignant neoplasm of breast: Secondary | ICD-10-CM

## 2014-06-11 DIAGNOSIS — C50412 Malignant neoplasm of upper-outer quadrant of left female breast: Secondary | ICD-10-CM

## 2014-06-11 NOTE — Progress Notes (Signed)
HPI: [name] was seen in the [facility] due to a personal and family history of cancer and concern regarding a hereditary predisposition to cancer in the family. Please refer to the prior Genetics clinic note for more information regarding [name]'s medical and family histories and our assessment at the time.   GENETIC TESTING: At the time of [name]'s visit, we recommended she pursue genetic testing of the OvaNext gene panel. This test, which included sequencing and deletion/duplication analysis of all of the genes, was performed at [facility]. 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 The remainder of the genes tested were negative for pathogenic mutations.   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 At this time, it is unknown if this variant is associated with an increased risk for cancer or if this is a normal finding. With time, we suspect the lab will reclassify this variant and when they do, we will try to re-contact [name] to discuss the reclassification further. A complete list of all genes analyzed is located on the test report scanned into Epic.  PALB2 CANCER RISKS: It is important to note that the studies on cancer risk associated with the PALB2 gene are generally limited. For this reason, the cancer risk estimates below are likely to change as new data is obtained about PALB2 pathogenic mutations, such as the one found in [name].  We discussed that pathogenic mutations in PALB2 have been shown to increase the risk of breast cancer to 33-58% by age 72 depending on the research study. PALB2 mutations have also been associated with an increased risk of pancreatic cancer, but specific risk estimates are not fully defined yet. It is also unknown, at this time, if PALB2 mutations may increase the risk for various other cancers.  MEDICAL  MANAGEMENT: Currently, there are limited medical management guidelines from the NCCN for women with a PALB2 mutation. Women should consider being seen at a high-risk breast clinic for a clinical breast exam twice per year. In addition to a yearly mammogram, the NCCN guidelines recommend a yearly breast MRI. Women may also wish to discuss the option of preventative bilateral mastectomies with their provider depending on their clinical and family histories of cancer; however, at this time there is no NCCN guideline recommendation for this specific intervention simply based on having a PALB2 mutation. It is also recommended [name] continue having a yearly gynecologic exam and speak to her gastroenterologist about when to have her next colonoscopy.There is no screening for pancreatic cancer that is recommended.   FAMILY MEMBERS: It is important that [name] informs her relatives of this genetic test result. It is recommended her relatives to speak with a genetic counselor prior to any testing. We are happy to help coordinate genetic counseling for any family member interested. Alternatively, a genetic counselor can be located at [email or web address].   We encouraged Ms. Circle to remain in contact with [place] on an annual basis so we can update her personal and family histories, and let her know of advances in cancer genetics that may benefit the family. Our contact number was provided. [name] questions were answered to her satisfaction today, and she knows she is welcome to call anytime with additional questions.    Catherine A. Fine, MS, CGC Certified Genetic Counseor phone: [phone] [email or web address]

## 2014-06-12 ENCOUNTER — Ambulatory Visit (HOSPITAL_BASED_OUTPATIENT_CLINIC_OR_DEPARTMENT_OTHER): Payer: Medicare Other

## 2014-06-12 ENCOUNTER — Other Ambulatory Visit (HOSPITAL_BASED_OUTPATIENT_CLINIC_OR_DEPARTMENT_OTHER): Payer: Medicare Other

## 2014-06-12 ENCOUNTER — Ambulatory Visit (HOSPITAL_BASED_OUTPATIENT_CLINIC_OR_DEPARTMENT_OTHER): Payer: Medicare Other | Admitting: Nurse Practitioner

## 2014-06-12 ENCOUNTER — Telehealth: Payer: Self-pay | Admitting: Nurse Practitioner

## 2014-06-12 ENCOUNTER — Ambulatory Visit: Payer: Medicare Other

## 2014-06-12 ENCOUNTER — Encounter: Payer: Self-pay | Admitting: Nurse Practitioner

## 2014-06-12 VITALS — BP 139/79 | HR 106 | Temp 98.8°F | Resp 18 | Ht 61.0 in | Wt 242.4 lb

## 2014-06-12 DIAGNOSIS — C50919 Malignant neoplasm of unspecified site of unspecified female breast: Secondary | ICD-10-CM

## 2014-06-12 DIAGNOSIS — D6481 Anemia due to antineoplastic chemotherapy: Secondary | ICD-10-CM

## 2014-06-12 DIAGNOSIS — C50412 Malignant neoplasm of upper-outer quadrant of left female breast: Secondary | ICD-10-CM

## 2014-06-12 DIAGNOSIS — C773 Secondary and unspecified malignant neoplasm of axilla and upper limb lymph nodes: Secondary | ICD-10-CM | POA: Diagnosis not present

## 2014-06-12 DIAGNOSIS — Z171 Estrogen receptor negative status [ER-]: Secondary | ICD-10-CM

## 2014-06-12 DIAGNOSIS — Z95828 Presence of other vascular implants and grafts: Secondary | ICD-10-CM

## 2014-06-12 DIAGNOSIS — E876 Hypokalemia: Secondary | ICD-10-CM

## 2014-06-12 DIAGNOSIS — Z452 Encounter for adjustment and management of vascular access device: Secondary | ICD-10-CM

## 2014-06-12 DIAGNOSIS — T451X5A Adverse effect of antineoplastic and immunosuppressive drugs, initial encounter: Secondary | ICD-10-CM

## 2014-06-12 DIAGNOSIS — D630 Anemia in neoplastic disease: Secondary | ICD-10-CM | POA: Insufficient documentation

## 2014-06-12 LAB — COMPREHENSIVE METABOLIC PANEL (CC13)
ALBUMIN: 3.2 g/dL — AB (ref 3.5–5.0)
ALT: 32 U/L (ref 0–55)
AST: 23 U/L (ref 5–34)
Alkaline Phosphatase: 127 U/L (ref 40–150)
Anion Gap: 12 mEq/L — ABNORMAL HIGH (ref 3–11)
BILIRUBIN TOTAL: 1.52 mg/dL — AB (ref 0.20–1.20)
BUN: 5.3 mg/dL — ABNORMAL LOW (ref 7.0–26.0)
CO2: 22 mEq/L (ref 22–29)
Calcium: 9.5 mg/dL (ref 8.4–10.4)
Chloride: 110 mEq/L — ABNORMAL HIGH (ref 98–109)
Creatinine: 0.8 mg/dL (ref 0.6–1.1)
GLUCOSE: 132 mg/dL (ref 70–140)
Potassium: 3.7 mEq/L (ref 3.5–5.1)
SODIUM: 144 meq/L (ref 136–145)
TOTAL PROTEIN: 6.5 g/dL (ref 6.4–8.3)

## 2014-06-12 LAB — CBC WITH DIFFERENTIAL/PLATELET
BASO%: 0.4 % (ref 0.0–2.0)
Basophils Absolute: 0 10*3/uL (ref 0.0–0.1)
EOS ABS: 0 10*3/uL (ref 0.0–0.5)
EOS%: 0.4 % (ref 0.0–7.0)
HCT: 29.2 % — ABNORMAL LOW (ref 34.8–46.6)
HGB: 9.6 g/dL — ABNORMAL LOW (ref 11.6–15.9)
LYMPH%: 25.6 % (ref 14.0–49.7)
MCH: 31.5 pg (ref 25.1–34.0)
MCHC: 32.8 g/dL (ref 31.5–36.0)
MCV: 95.9 fL (ref 79.5–101.0)
MONO#: 0.4 10*3/uL (ref 0.1–0.9)
MONO%: 12 % (ref 0.0–14.0)
NEUT#: 1.9 10*3/uL (ref 1.5–6.5)
NEUT%: 61.6 % (ref 38.4–76.8)
PLATELETS: 231 10*3/uL (ref 145–400)
RBC: 3.05 10*6/uL — AB (ref 3.70–5.45)
RDW: 23 % — ABNORMAL HIGH (ref 11.2–14.5)
WBC: 3 10*3/uL — ABNORMAL LOW (ref 3.9–10.3)
lymph#: 0.8 10*3/uL — ABNORMAL LOW (ref 0.9–3.3)

## 2014-06-12 MED ORDER — SODIUM CHLORIDE 0.9 % IJ SOLN
10.0000 mL | INTRAMUSCULAR | Status: DC | PRN
  Administered 2014-06-12: 10 mL via INTRAVENOUS
  Filled 2014-06-12: qty 10

## 2014-06-12 NOTE — Progress Notes (Signed)
[facility]  Telephone:(336) [phone] Fax:(336) [phone]     ID: [name] DOB: 1950/10/04  MR#: [id number]  TFT#:[id number]  PCP: Maximino Greenland, MD GYN: SU: [name] OTHER MD: [name]  CHIEF COMPLAINT: Stage III breast cancer, triple negative   CURRENT TREATMENT: Neoadjuvant chemotherapy  BREAST CANCER HISTORY: From the original intake note:  [name] herself palpated a mass in her left breast, and immediately brought it to the attention of her primary care physician, Dr. Baird Cancer, who confirmed the finding and setup the patient for bilateral diagnostic mammography at the breast Center 02/20/2014. This showed a high density mass in the outer left breast measuring up to 3.7 cm. It corresponded to the site of palpable concern. In addition the normal 4 logically abnormal lymph nodes in the left axilla. The mass was palpable by exam, and by ultrasonography measured 2.6 cm. There were enlarged and morphologically abnormal lymph nodes in the left axilla, largest measuring 3 cm.  Biopsy of the left breast mass in question and 1 of the abnormal axillary lymph nodes 02/22/2014 showed ([id number]) biopsies to be positive for an invasive ductal carcinoma, grade 3, triple negative, with an MIB-1 of 90%. Specifically the HER-2 signals ratio was 1.05, and the number per cell 2.00.  MRI of the breasts 03/01/2014 showed a 3.8 cm enhancing mass with areas of apparent necrosis in the left breast, as well as the biopsy tract extending laterally, the combined measure 5.3 cm. There were no other masses or areas of abnormal enhancement. There were multiple abnormally enlarged left axillary lymph nodes with loss of the normal fatty hilum. These included level I and retropectoral lymph nodes.  The patient's subsequent history is as detailed below   INTERVAL HISTORY: Tonna returns today for follow up of her breast cancer. Today is day 1, cycle 5 of 12 planned weekly cycles of  paclitaxel and carboplatin. 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 This is actually getting better. Kathyjo states that her feeling is returning to these digits.   REVIEW OF SYSTEMS: Alda denies fevers, chills, nausea, vomiting, or changes in bowel or bladder habits. She has no mouth sores. Her appetite has not changed. She denies shortness of breath, chest pain, cough, fatigue, or  palpitations.. A detailed review of systems was otherwise negative.   PAST MEDICAL HISTORY: Past Medical History  Diagnosis Date  . Colon cancer   . Hx of rotator cuff surgery     PAST SURGICAL HISTORY: Past Surgical History  Procedure Laterality Date  . Abdominal hysterectomy    . Colon surgery      colectomy/colostomy with takedown    FAMILY HISTORY Family History  Problem Relation Age of Onset  . Cancer Mother 44    breast  . Cancer Maternal Aunt     breast cancer at unknown age   the patient's father died at the age of 43 following a stroke in the setting of diabetes; the patient's mother is living at 36. The patient has 3 brothers and 2 sisters. [name], [name], was diagnosed with breast cancer in her early 35s. There is no other breast or ovarian cancer in the family. The patient herself was diagnosed with watts by history he is an incidentally found stage I colon carcinoma noted at the time of her hysterectomy. She had a partial colectomy but no adjuvant treatment. We have no records regarding that procedure  GYNECOLOGIC HISTORY:  No LMP recorded.  Patient has had a hysterectomy. Menarche age 43, first live birth age 22, the patient underwent total abdominal hysterectomy with bilateral salpingo-oophorectomy in her 32s. She did not take hormone replacement. She did not take oral contraceptives at any point.  SOCIAL HISTORY:  Masae work for [employer] for about 40 years, retiring in 2008. She is divorced and lives alone, with no  pets. Her daughter [name] works for [employer] in [employer]. The second daughter, [name], works as a Research scientist (physical sciences) for [employer]. The patient has 6 grandchildren and one great-grandchild. She attends a [employer].    ADVANCED DIRECTIVES: Not in place. The patient intends to name her daughter [name]. [name] is work number is [id number]. On the patient's 03/01/2014 visit she was given a copy of the appropriate documents to complete and notarize at her discretion   HEALTH MAINTENANCE: History  Substance Use Topics  . Smoking status: Never Smoker   . Smokeless tobacco: Not on file  . Alcohol Use: No     Colonoscopy: 2000  PAP: May 2015  Bone density: 05/23/2004, at [facility] hospital; normal  Lipid panel:  No Known Allergies  Current Outpatient Prescriptions  Medication Sig Dispense Refill  . acetaminophen (TYLENOL) 500 MG tablet Take 1,000 mg by mouth every 6 (six) hours as needed for mild pain or moderate pain.      Marland Kitchen dexamethasone (DECADRON) 4 MG tablet Take 2 tablets by mouth once a day on the day after chemotherapy and then take 2 tablets two times a day for 2 days. Take with food.  30 tablet  1  . lidocaine-prilocaine (EMLA) cream Apply 1 application topically once. Apply to port 1-2 hours prior to access.  30 g  1  . loteprednol (LOTEMAX) 0.5 % ophthalmic suspension Place 1 drop into both eyes 3 (three) times daily.      . ondansetron (ZOFRAN) 8 MG tablet Take 1 tablet (8 mg total) by mouth 2 (two) times daily as needed. Start on the third day after chemotherapy.  30 tablet  0  . potassium chloride (K-DUR) 10 MEQ tablet Take 1 tablet (10 mEq total) by mouth 2 (two) times daily.  60 tablet  4  . tobramycin-dexamethasone (TOBRADEX) ophthalmic solution Place 1 drop into both eyes every 4 (four) hours while awake.  5 mL  0   No current facility-administered medications for this visit.    OBJECTIVE: Middle-aged [place] American  woman who appears stated age 15 Vitals:   06/12/14 0943  BP: 139/79  Pulse: 106  Temp: 98.8 F (37.1 C)  Resp: 18     Body mass index is 45.82 kg/(m^2).    ECOG FS:1 - Symptomatic but completely ambulatory    Skin: warm, dry, nail beds and bilateral palms hyperpigmented.  Sclerae unicteric, pupils equal and reactive, scalp dry and flaky Oropharynx clear and moist-- no thrush No cervical or supraclavicular adenopathy Lungs no rales or rhonchi Heart regular rate and rhythm Abd soft, nontender, positive bowel sounds MSK no focal spinal tenderness, no upper extremity lymphedema Neuro: nonfocal, well oriented, appropriate affect Breasts: deferred  LAB RESULTS:  I No results found for this basename: SPEP,  UPEP,   kappa and lambda light chains    Lab Results  Component Value Date   WBC 3.0* 06/12/2014   NEUTROABS 1.9 06/12/2014   HGB 9.6* 06/12/2014   HCT 29.2* 06/12/2014   MCV 95.9 06/12/2014   PLT 231 06/12/2014  Chemistry      Component Value Date/Time   NA 144 06/12/2014 0907   NA 143 04/24/2014 0958   K 3.7 06/12/2014 0907   K 3.5 04/24/2014 0958   CL 105 04/24/2014 0958   CO2 22 06/12/2014 0907   CO2 25 04/24/2014 0958   BUN 5.3* 06/12/2014 0907   BUN 12 04/24/2014 0958   CREATININE 0.8 06/12/2014 0907   CREATININE 0.93 04/24/2014 0958      Component Value Date/Time   CALCIUM 9.5 06/12/2014 0907   CALCIUM 9.3 04/24/2014 0958   ALKPHOS 127 06/12/2014 0907   ALKPHOS 121* 04/24/2014 0958   AST 23 06/12/2014 0907   AST 13 04/24/2014 0958   ALT 32 06/12/2014 0907   ALT 25 04/24/2014 0958   BILITOT 1.52* 06/12/2014 0907   BILITOT 0.3 04/24/2014 0958       No results found for this basename: LABCA2    No components found with this basename: [id number]    No results found for this basename: INR,  in the last 168 hours  Urinalysis    Component Value Date/Time   COLORURINE YELLOW 04/05/2008 1120   APPEARANCEUR CLEAR 04/05/2008 1120   LABSPEC 1.021 04/05/2008 1120    PHURINE 6.0 04/05/2008 1120   GLUCOSEU NEGATIVE 04/05/2008 1120   HGBUR NEGATIVE 04/05/2008 1120   BILIRUBINUR NEGATIVE 04/05/2008 1120   KETONESUR NEGATIVE 04/05/2008 1120   PROTEINUR NEGATIVE 04/05/2008 1120   UROBILINOGEN 0.2 04/05/2008 1120   NITRITE NEGATIVE 04/05/2008 1120   LEUKOCYTESUR NEGATIVE MICROSCOPIC NOT DONE ON URINES WITH NEGATIVE PROTEIN, BLOOD, LEUKOCYTES, NITRITE, OR GLUCOSE <1000 mg/dL. 04/05/2008 1120    STUDIES: No results found.  ASSESSMENT: 63 y.o. [place] woman s/p breast and left axillary lymph node biopsy 02/22/2014, both positive for a clinical T2 N2, stage IIIA invasive ductal carcinoma, grade 3, triple negative, with an MIB-1 of 90%  (1) neoadjuvant chemotherapy started 03/13/2014, with cyclophosphamide and doxorubicin in dose dense fashion x4, with Neulasta support on day 2, completed 04/24/2014, to be followed by weekly carboplatin and paclitaxel x12  (2) definitive surgery to follow chemotherapy  (3) radiation to follow surgery  (4) remote history of partial colectomy for early stage colon cancer incidentally found during TAH/BSO in the 1980s  (5) genetics testing since 04/03/2014, results pending  (6) staging studies showed right-sided lung nodules, too small to characterize, and a dominant right-sided thyroid nodule unchanged in size as compared to 2011; to be followed  PLAN: Fabienne continues to feel well. The labs were reviewed in detail with her. Her potassium, ANC, and hgb have improved since her break in treatment last week. However, the upward trend in her total bili that has increased every week so far, finally hit 1.52. I consulted with Dr. Jana Hakim and he agrees that another week off of treatment would be best.   When Vaughan Basta return next week, if her labs have improved we will proceed with day 1, cycle 5 of the carboplatin and a dose reduction by 20 % of the paclitaxel as previously planned. Dreyah understands and agrees with this plan. She knows the  goal of treatment in her case is cure. She has been encouraged to call with any issues that might arise before her next visit here.      Marcelino Duster, NP   06/12/2014 10:56 AM

## 2014-06-12 NOTE — Telephone Encounter (Signed)
per pof to sch pt-gave pt copy of sch

## 2014-06-12 NOTE — Patient Instructions (Signed)

## 2014-06-13 ENCOUNTER — Ambulatory Visit: Payer: Medicare Other

## 2014-06-13 ENCOUNTER — Other Ambulatory Visit: Payer: Self-pay | Admitting: Oncology

## 2014-06-13 NOTE — Progress Notes (Unsigned)
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 The remainder of the genes tested were negative for pathogenic mutations.

## 2014-06-19 ENCOUNTER — Ambulatory Visit: Payer: Medicare Other

## 2014-06-19 ENCOUNTER — Other Ambulatory Visit (HOSPITAL_BASED_OUTPATIENT_CLINIC_OR_DEPARTMENT_OTHER): Payer: Medicare Other

## 2014-06-19 ENCOUNTER — Encounter: Payer: Self-pay | Admitting: Nurse Practitioner

## 2014-06-19 ENCOUNTER — Other Ambulatory Visit: Payer: Self-pay | Admitting: Oncology

## 2014-06-19 ENCOUNTER — Ambulatory Visit (HOSPITAL_BASED_OUTPATIENT_CLINIC_OR_DEPARTMENT_OTHER): Payer: Medicare Other

## 2014-06-19 ENCOUNTER — Other Ambulatory Visit: Payer: Self-pay | Admitting: *Deleted

## 2014-06-19 ENCOUNTER — Ambulatory Visit (HOSPITAL_BASED_OUTPATIENT_CLINIC_OR_DEPARTMENT_OTHER): Payer: Medicare Other | Admitting: Nurse Practitioner

## 2014-06-19 VITALS — BP 129/92 | HR 104 | Temp 98.7°F | Resp 20 | Ht 61.0 in | Wt 240.1 lb

## 2014-06-19 DIAGNOSIS — C773 Secondary and unspecified malignant neoplasm of axilla and upper limb lymph nodes: Secondary | ICD-10-CM

## 2014-06-19 DIAGNOSIS — C50412 Malignant neoplasm of upper-outer quadrant of left female breast: Secondary | ICD-10-CM

## 2014-06-19 DIAGNOSIS — Z95828 Presence of other vascular implants and grafts: Secondary | ICD-10-CM

## 2014-06-19 DIAGNOSIS — Z5111 Encounter for antineoplastic chemotherapy: Secondary | ICD-10-CM | POA: Diagnosis not present

## 2014-06-19 LAB — COMPREHENSIVE METABOLIC PANEL (CC13)
ALT: 35 U/L (ref 0–55)
ANION GAP: 10 meq/L (ref 3–11)
AST: 24 U/L (ref 5–34)
Albumin: 3.1 g/dL — ABNORMAL LOW (ref 3.5–5.0)
Alkaline Phosphatase: 128 U/L (ref 40–150)
BILIRUBIN TOTAL: 1.24 mg/dL — AB (ref 0.20–1.20)
BUN: 4.8 mg/dL — ABNORMAL LOW (ref 7.0–26.0)
CO2: 22 meq/L (ref 22–29)
CREATININE: 0.8 mg/dL (ref 0.6–1.1)
Calcium: 9.6 mg/dL (ref 8.4–10.4)
Chloride: 111 mEq/L — ABNORMAL HIGH (ref 98–109)
GLUCOSE: 108 mg/dL (ref 70–140)
Potassium: 3.8 mEq/L (ref 3.5–5.1)
SODIUM: 143 meq/L (ref 136–145)
Total Protein: 6.5 g/dL (ref 6.4–8.3)

## 2014-06-19 LAB — CBC WITH DIFFERENTIAL/PLATELET
BASO%: 0.3 % (ref 0.0–2.0)
Basophils Absolute: 0 10*3/uL (ref 0.0–0.1)
EOS ABS: 0 10*3/uL (ref 0.0–0.5)
EOS%: 0.9 % (ref 0.0–7.0)
HEMATOCRIT: 29.7 % — AB (ref 34.8–46.6)
HGB: 9.5 g/dL — ABNORMAL LOW (ref 11.6–15.9)
LYMPH%: 24 % (ref 14.0–49.7)
MCH: 31.4 pg (ref 25.1–34.0)
MCHC: 32.1 g/dL (ref 31.5–36.0)
MCV: 98 fL (ref 79.5–101.0)
MONO#: 0.5 10*3/uL (ref 0.1–0.9)
MONO%: 12.4 % (ref 0.0–14.0)
NEUT%: 62.4 % (ref 38.4–76.8)
NEUTROS ABS: 2.5 10*3/uL (ref 1.5–6.5)
PLATELETS: 233 10*3/uL (ref 145–400)
RBC: 3.03 10*6/uL — AB (ref 3.70–5.45)
RDW: 20.1 % — ABNORMAL HIGH (ref 11.2–14.5)
WBC: 4 10*3/uL (ref 3.9–10.3)
lymph#: 1 10*3/uL (ref 0.9–3.3)

## 2014-06-19 MED ORDER — FAMOTIDINE IN NACL 20-0.9 MG/50ML-% IV SOLN
INTRAVENOUS | Status: AC
  Filled 2014-06-19: qty 50

## 2014-06-19 MED ORDER — DIPHENHYDRAMINE HCL 50 MG/ML IJ SOLN
INTRAMUSCULAR | Status: AC
  Filled 2014-06-19: qty 1

## 2014-06-19 MED ORDER — SODIUM CHLORIDE 0.9 % IJ SOLN
10.0000 mL | INTRAMUSCULAR | Status: DC | PRN
  Administered 2014-06-19: 10 mL
  Filled 2014-06-19: qty 10

## 2014-06-19 MED ORDER — FAMOTIDINE IN NACL 20-0.9 MG/50ML-% IV SOLN
20.0000 mg | Freq: Once | INTRAVENOUS | Status: AC
  Administered 2014-06-19: 20 mg via INTRAVENOUS

## 2014-06-19 MED ORDER — ONDANSETRON 16 MG/50ML IVPB (CHCC)
INTRAVENOUS | Status: AC
  Filled 2014-06-19: qty 16

## 2014-06-19 MED ORDER — SODIUM CHLORIDE 0.9 % IJ SOLN
10.0000 mL | INTRAMUSCULAR | Status: DC | PRN
  Administered 2014-06-19: 10 mL via INTRAVENOUS
  Filled 2014-06-19: qty 10

## 2014-06-19 MED ORDER — ONDANSETRON 16 MG/50ML IVPB (CHCC)
16.0000 mg | Freq: Once | INTRAVENOUS | Status: AC
  Administered 2014-06-19: 16 mg via INTRAVENOUS

## 2014-06-19 MED ORDER — HEPARIN SOD (PORK) LOCK FLUSH 100 UNIT/ML IV SOLN
500.0000 [IU] | Freq: Once | INTRAVENOUS | Status: AC | PRN
  Administered 2014-06-19: 500 [IU]
  Filled 2014-06-19: qty 5

## 2014-06-19 MED ORDER — DIPHENHYDRAMINE HCL 50 MG/ML IJ SOLN
25.0000 mg | Freq: Once | INTRAMUSCULAR | Status: AC
  Administered 2014-06-19: 25 mg via INTRAVENOUS

## 2014-06-19 MED ORDER — SODIUM CHLORIDE 0.9 % IV SOLN
Freq: Once | INTRAVENOUS | Status: AC
Start: 2014-06-19 — End: 2014-06-19
  Administered 2014-06-19: 10:00:00 via INTRAVENOUS

## 2014-06-19 MED ORDER — PROCHLORPERAZINE MALEATE 10 MG PO TABS: 10.0000 mg | ORAL_TABLET | Freq: Four times a day (QID) | ORAL | Status: DC | PRN

## 2014-06-19 MED ORDER — DEXAMETHASONE SODIUM PHOSPHATE 20 MG/5ML IJ SOLN
20.0000 mg | Freq: Once | INTRAMUSCULAR | Status: AC
  Administered 2014-06-19: 20 mg via INTRAVENOUS

## 2014-06-19 MED ORDER — DEXAMETHASONE SODIUM PHOSPHATE 20 MG/5ML IJ SOLN
INTRAMUSCULAR | Status: AC
  Filled 2014-06-19: qty 5

## 2014-06-19 MED ORDER — SODIUM CHLORIDE 0.9 % IV SOLN
270.0000 mg | Freq: Once | INTRAVENOUS | Status: AC
  Administered 2014-06-19: 270 mg via INTRAVENOUS
  Filled 2014-06-19: qty 27

## 2014-06-19 MED ORDER — PACLITAXEL CHEMO INJECTION 300 MG/50ML
64.0000 mg/m2 | Freq: Once | INTRAVENOUS | Status: AC
  Administered 2014-06-19: 138 mg via INTRAVENOUS
  Filled 2014-06-19: qty 23

## 2014-06-19 NOTE — Patient Instructions (Signed)

## 2014-06-19 NOTE — Progress Notes (Signed)
[facility]  Telephone:(336) [phone] Fax:(336) [phone]     ID: JALIN ERPELDING DOB: October 31, 1950  MR#: [id number]  FUX#:[id number]  PCP: Maximino Greenland, MD GYN: SU: Rolm Bookbinder OTHER MD: Gery Pray  CHIEF COMPLAINT: Stage III breast cancer, triple negative   CURRENT TREATMENT: Neoadjuvant chemotherapy  BREAST CANCER HISTORY: From the original intake note:  Toy herself palpated a mass in her left breast, and immediately brought it to the attention of her primary care physician, Dr. Baird Cancer, who confirmed the finding and setup the patient for bilateral diagnostic mammography at the breast Center 02/20/2014. This showed a high density mass in the outer left breast measuring up to 3.7 cm. It corresponded to the site of palpable concern. In addition the normal 4 logically abnormal lymph nodes in the left axilla. The mass was palpable by exam, and by ultrasonography measured 2.6 cm. There were enlarged and morphologically abnormal lymph nodes in the left axilla, largest measuring 3 cm.  Biopsy of the left breast mass in question and 1 of the abnormal axillary lymph nodes 02/22/2014 showed ([id number]) biopsies to be positive for an invasive ductal carcinoma, grade 3, triple negative, with an MIB-1 of 90%. Specifically the HER-2 signals ratio was 1.05, and the number per cell 2.00.  MRI of the breasts 03/01/2014 showed a 3.8 cm enhancing mass with areas of apparent necrosis in the left breast, as well as the biopsy tract extending laterally, the combined measure 5.3 cm. There were no other masses or areas of abnormal enhancement. There were multiple abnormally enlarged left axillary lymph nodes with loss of the normal fatty hilum. These included level I and retropectoral lymph nodes.  The patient's subsequent history is as detailed below   INTERVAL HISTORY: Lynda returns today for follow up of her breast cancer. Today is day 1, cycle 5 of 12 planned weekly cycles of  paclitaxel and carboplatin. Last week was delayed because of worsening total bili levels.   REVIEW OF SYSTEMS: Kaisey denies pain, fever, chills, or changes in bowel or bladder habits. She has some occasional nausea, for which she uses compazine PRN. She has no mouth sores. Her appetite has not changed, though her taste is often perverted. She denies shortness of breath, chest pain, cough, fatigue, or palpitations.. A detailed review of systems was otherwise negative.   PAST MEDICAL HISTORY: Past Medical History  Diagnosis Date  . Colon cancer   . Hx of rotator cuff surgery     PAST SURGICAL HISTORY: Past Surgical History  Procedure Laterality Date  . Abdominal hysterectomy    . Colon surgery      colectomy/colostomy with takedown    FAMILY HISTORY Family History  Problem Relation Age of Onset  . Cancer Mother 31    breast  . Cancer Maternal Aunt     breast cancer at unknown age   the patient's father died at the age of 76 following a stroke in the setting of diabetes; the patient's mother is living at 58. The patient has 3 brothers and 2 sisters. [name], [name], was diagnosed with breast cancer in her early 63s. There is no other breast or ovarian cancer in the family. The patient herself was diagnosed with watts by history he is an incidentally found stage I colon carcinoma noted at the time of her hysterectomy. She had a partial colectomy but no adjuvant treatment. We have no records regarding that procedure  GYNECOLOGIC HISTORY:  No LMP recorded. Patient has had  a hysterectomy. Menarche age 63, first live birth age 63, the patient underwent total abdominal hysterectomy with bilateral salpingo-oophorectomy in her 43s. She did not take hormone replacement. She did not take oral contraceptives at any point.  SOCIAL HISTORY:  Inella work for [employer] for about 40 years, retiring in 2008. She is divorced and lives alone, with no pets. Her daughter Isyss Espinal  works for [employer] in [employer] [place]. The second daughter, Kasheena Sambrano, works as a Research scientist (physical sciences) for [employer]. The patient has 6 grandchildren and one great-grandchild. She attends a [employer].    ADVANCED DIRECTIVES: Not in place. The patient intends to name her daughter [name]. her healthcare power of attorney. [name] is work number is [id number]. On the patient's 03/01/2014 visit she was given a copy of the appropriate documents to complete and notarize at her discretion   HEALTH MAINTENANCE: History  Substance Use Topics  . Smoking status: Never Smoker   . Smokeless tobacco: Not on file  . Alcohol Use: No     Colonoscopy: 2000  PAP: May 2015  Bone density: 05/23/2004, at [facility] hospital; normal  Lipid panel:  No Known Allergies  Current Outpatient Prescriptions  Medication Sig Dispense Refill  . dexamethasone (DECADRON) 4 MG tablet Take 2 tablets by mouth once a day on the day after chemotherapy and then take 2 tablets two times a day for 2 days. Take with food.  30 tablet  1  . lidocaine-prilocaine (EMLA) cream Apply 1 application topically once. Apply to port 1-2 hours prior to access.  30 g  1  . loteprednol (LOTEMAX) 0.5 % ophthalmic suspension Place 1 drop into both eyes 3 (three) times daily.      . ondansetron (ZOFRAN) 8 MG tablet Take 1 tablet (8 mg total) by mouth 2 (two) times daily as needed. Start on the third day after chemotherapy.  30 tablet  0  . potassium chloride (K-DUR) 10 MEQ tablet Take 1 tablet (10 mEq total) by mouth 2 (two) times daily.  60 tablet  4  . tobramycin-dexamethasone (TOBRADEX) ophthalmic solution Place 1 drop into both eyes every 4 (four) hours while awake.  5 mL  0  . acetaminophen (TYLENOL) 500 MG tablet Take 1,000 mg by mouth every 6 (six) hours as needed for mild pain or moderate pain.      Marland Kitchen prochlorperazine (COMPAZINE) 10 MG tablet Take 1 tablet (10 mg total) by mouth every 6 (six) hours as needed for nausea or vomiting.  30 tablet  0    No current facility-administered medications for this visit.    OBJECTIVE: Middle-aged [place] American woman who appears stated age 63 Vitals:   06/19/14 0940  BP: 129/92  Pulse: 104  Temp: 98.7 F (37.1 C)  Resp: 20     Body mass index is 45.39 kg/(m^2).    ECOG FS:1 - Symptomatic but completely ambulatory    Skin: warm, dry, nail beds and bilateral palms hyperpigmented.  HEENT: sclerae anicteric, conjunctivae pink, oropharynx clear. No thrush or mucositis.  Lymph Nodes: No cervical or supraclavicular lymphadenopathy  Lungs: clear to auscultation bilaterally, no rales, wheezes, or rhonci  Heart: regular rate and rhythm  Abdomen: round, soft, non tender, positive bowel sounds  Musculoskeletal: No focal spinal tenderness, no peripheral edema  Neuro: non focal, well oriented, positive affect  Breasts: deferred  LAB RESULTS:  I No results found for this basename: SPEP,  UPEP,   kappa and lambda light chains    Lab Results  Component Value Date   WBC 4.0 06/19/2014   NEUTROABS 2.5 06/19/2014   HGB 9.5* 06/19/2014   HCT 29.7* 06/19/2014   MCV 98.0 06/19/2014   PLT 233 06/19/2014      Chemistry      Component Value Date/Time   NA 143 06/19/2014 0909   NA 143 04/24/2014 0958   K 3.8 06/19/2014 0909   K 3.5 04/24/2014 0958   CL 105 04/24/2014 0958   CO2 22 06/19/2014 0909   CO2 25 04/24/2014 0958   BUN 4.8* 06/19/2014 0909   BUN 12 04/24/2014 0958   CREATININE 0.8 06/19/2014 0909   CREATININE 0.93 04/24/2014 0958      Component Value Date/Time   CALCIUM 9.6 06/19/2014 0909   CALCIUM 9.3 04/24/2014 0958   ALKPHOS 128 06/19/2014 0909   ALKPHOS 121* 04/24/2014 0958   AST 24 06/19/2014 0909   AST 13 04/24/2014 0958   ALT 35 06/19/2014 0909   ALT 25 04/24/2014 0958   BILITOT 1.24* 06/19/2014 0909   BILITOT 0.3 04/24/2014 0958       No results found for this basename: LABCA2    No components found with this basename: [id number]    No results found for this basename: INR,  in  the last 168 hours  Urinalysis    Component Value Date/Time   COLORURINE YELLOW 04/05/2008 1120   APPEARANCEUR CLEAR 04/05/2008 1120   LABSPEC 1.021 04/05/2008 1120   PHURINE 6.0 04/05/2008 1120   GLUCOSEU NEGATIVE 04/05/2008 1120   HGBUR NEGATIVE 04/05/2008 1120   BILIRUBINUR NEGATIVE 04/05/2008 1120   KETONESUR NEGATIVE 04/05/2008 1120   PROTEINUR NEGATIVE 04/05/2008 1120   UROBILINOGEN 0.2 04/05/2008 1120   NITRITE NEGATIVE 04/05/2008 1120   LEUKOCYTESUR NEGATIVE MICROSCOPIC NOT DONE ON URINES WITH NEGATIVE PROTEIN, BLOOD, LEUKOCYTES, NITRITE, OR GLUCOSE <1000 mg/dL. 04/05/2008 1120    STUDIES: No results found.  ASSESSMENT: 63 y.o. [place] woman s/p breast and left axillary lymph node biopsy 02/22/2014, both positive for a clinical T2 N2, stage IIIA invasive ductal carcinoma, grade 3, triple negative, with an MIB-1 of 90%  (1) neoadjuvant chemotherapy started 03/13/2014, with cyclophosphamide and doxorubicin in dose dense fashion x4, with Neulasta support on day 2, completed 04/24/2014, to be followed by weekly carboplatin and paclitaxel x12  (2) definitive surgery to follow chemotherapy  (3) radiation to follow surgery  (4) remote history of partial colectomy for early stage colon cancer incidentally found during TAH/BSO in the 1980s  (5) genetics testing since 04/03/2014, demonstrated a pathogenic mutation in the PALB2 gene  (6) staging studies showed right-sided lung nodules, too small to characterize, and a dominant right-sided thyroid nodule unchanged in size as compared to 2011; to be followed  PLAN: Minnah is doing well today. The labs were reviewed in detail, and her total bili has trended down to 1.24. I consulted with Dr. Jana Hakim, and he agrees that we should proceed with cycle 5 of carboplatin and taxol (dose reduced by 20%).   Caly will return next week for labs, an office visit, and the start of cycle 6. Kaleeah understands and agrees with this plan. She knows the goal  of treatment in her case is cure. She has been encouraged to call with any issues that might arise before her next visit here.     Marcelino Duster, NP   06/19/2014 10:20 AM

## 2014-06-19 NOTE — Patient Instructions (Signed)
[facility] Discharge Instructions for Patients Receiving Chemotherapy  Today you received the following chemotherapy agents: Taxol and Carboplatin.  To help prevent nausea and vomiting after your treatment, we encourage you to take your nausea medication as prescribed.   If you develop nausea and vomiting that is not controlled by your nausea medication, call the clinic.   BELOW ARE SYMPTOMS THAT SHOULD BE REPORTED IMMEDIATELY:  *FEVER GREATER THAN 100.5 F  *CHILLS WITH OR WITHOUT FEVER  NAUSEA AND VOMITING THAT IS NOT CONTROLLED WITH YOUR NAUSEA MEDICATION  *UNUSUAL SHORTNESS OF BREATH  *UNUSUAL BRUISING OR BLEEDING  TENDERNESS IN MOUTH AND THROAT WITH OR WITHOUT PRESENCE OF ULCERS  *URINARY PROBLEMS  *BOWEL PROBLEMS  UNUSUAL RASH Items with * indicate a potential emergency and should be followed up as soon as possible.  Feel free to call the clinic you have any questions or concerns. The clinic phone number is [phone].    

## 2014-06-20 ENCOUNTER — Ambulatory Visit: Payer: Medicare Other

## 2014-06-22 DIAGNOSIS — H40062 Primary angle closure without glaucoma damage, left eye: Secondary | ICD-10-CM | POA: Diagnosis not present

## 2014-06-23 ENCOUNTER — Encounter: Payer: Self-pay | Admitting: Nurse Practitioner

## 2014-06-23 NOTE — Progress Notes (Signed)
Patient has cancer policy with [facility] [place]. Clarise Cruz called for diag codes and confirmation of dates of servces 06/05/14  06/12/14 06/19/14. [phone] ref# [id number]

## 2014-06-26 ENCOUNTER — Ambulatory Visit (HOSPITAL_BASED_OUTPATIENT_CLINIC_OR_DEPARTMENT_OTHER): Payer: Medicare Other

## 2014-06-26 ENCOUNTER — Telehealth: Payer: Self-pay | Admitting: *Deleted

## 2014-06-26 ENCOUNTER — Ambulatory Visit (HOSPITAL_BASED_OUTPATIENT_CLINIC_OR_DEPARTMENT_OTHER): Payer: Medicare Other | Admitting: Nurse Practitioner

## 2014-06-26 ENCOUNTER — Encounter: Payer: Self-pay | Admitting: Nurse Practitioner

## 2014-06-26 ENCOUNTER — Other Ambulatory Visit: Payer: Self-pay | Admitting: Oncology

## 2014-06-26 ENCOUNTER — Other Ambulatory Visit (HOSPITAL_BASED_OUTPATIENT_CLINIC_OR_DEPARTMENT_OTHER): Payer: Medicare Other

## 2014-06-26 ENCOUNTER — Telehealth: Payer: Self-pay | Admitting: Nurse Practitioner

## 2014-06-26 ENCOUNTER — Ambulatory Visit: Payer: Medicare Other

## 2014-06-26 VITALS — BP 119/91 | HR 108 | Temp 99.1°F | Resp 20 | Ht 61.0 in | Wt 236.7 lb

## 2014-06-26 DIAGNOSIS — Z170 Estrogen receptor positive status [ER+]: Secondary | ICD-10-CM | POA: Diagnosis not present

## 2014-06-26 DIAGNOSIS — T451X5A Adverse effect of antineoplastic and immunosuppressive drugs, initial encounter: Secondary | ICD-10-CM

## 2014-06-26 DIAGNOSIS — C50812 Malignant neoplasm of overlapping sites of left female breast: Secondary | ICD-10-CM

## 2014-06-26 DIAGNOSIS — C773 Secondary and unspecified malignant neoplasm of axilla and upper limb lymph nodes: Secondary | ICD-10-CM

## 2014-06-26 DIAGNOSIS — D6481 Anemia due to antineoplastic chemotherapy: Secondary | ICD-10-CM

## 2014-06-26 DIAGNOSIS — D61810 Antineoplastic chemotherapy induced pancytopenia: Secondary | ICD-10-CM

## 2014-06-26 DIAGNOSIS — Z5111 Encounter for antineoplastic chemotherapy: Secondary | ICD-10-CM | POA: Diagnosis not present

## 2014-06-26 DIAGNOSIS — C50412 Malignant neoplasm of upper-outer quadrant of left female breast: Secondary | ICD-10-CM

## 2014-06-26 DIAGNOSIS — Z95828 Presence of other vascular implants and grafts: Secondary | ICD-10-CM

## 2014-06-26 LAB — COMPREHENSIVE METABOLIC PANEL (CC13)
ALT: 34 U/L (ref 0–55)
ANION GAP: 11 meq/L (ref 3–11)
AST: 23 U/L (ref 5–34)
Albumin: 3.2 g/dL — ABNORMAL LOW (ref 3.5–5.0)
Alkaline Phosphatase: 115 U/L (ref 40–150)
BUN: 4.3 mg/dL — AB (ref 7.0–26.0)
CALCIUM: 9.6 mg/dL (ref 8.4–10.4)
CO2: 23 meq/L (ref 22–29)
Chloride: 110 mEq/L — ABNORMAL HIGH (ref 98–109)
Creatinine: 0.8 mg/dL (ref 0.6–1.1)
Glucose: 106 mg/dl (ref 70–140)
POTASSIUM: 3.5 meq/L (ref 3.5–5.1)
SODIUM: 143 meq/L (ref 136–145)
TOTAL PROTEIN: 6.7 g/dL (ref 6.4–8.3)
Total Bilirubin: 1.06 mg/dL (ref 0.20–1.20)

## 2014-06-26 LAB — CBC WITH DIFFERENTIAL/PLATELET
BASO%: 0.8 % (ref 0.0–2.0)
Basophils Absolute: 0 10*3/uL (ref 0.0–0.1)
EOS%: 3.2 % (ref 0.0–7.0)
Eosinophils Absolute: 0.1 10*3/uL (ref 0.0–0.5)
HCT: 29.2 % — ABNORMAL LOW (ref 34.8–46.6)
HGB: 9.4 g/dL — ABNORMAL LOW (ref 11.6–15.9)
LYMPH%: 23.5 % (ref 14.0–49.7)
MCH: 31.6 pg (ref 25.1–34.0)
MCHC: 32.1 g/dL (ref 31.5–36.0)
MCV: 98.5 fL (ref 79.5–101.0)
MONO#: 0.3 10*3/uL (ref 0.1–0.9)
MONO%: 8.7 % (ref 0.0–14.0)
NEUT%: 63.8 % (ref 38.4–76.8)
NEUTROS ABS: 2.1 10*3/uL (ref 1.5–6.5)
PLATELETS: 275 10*3/uL (ref 145–400)
RBC: 2.97 10*6/uL — AB (ref 3.70–5.45)
RDW: 17.1 % — AB (ref 11.2–14.5)
WBC: 3.3 10*3/uL — AB (ref 3.9–10.3)
lymph#: 0.8 10*3/uL — ABNORMAL LOW (ref 0.9–3.3)

## 2014-06-26 MED ORDER — SODIUM CHLORIDE 0.9 % IJ SOLN
10.0000 mL | INTRAMUSCULAR | Status: DC | PRN
  Administered 2014-06-26: 10 mL
  Filled 2014-06-26: qty 10

## 2014-06-26 MED ORDER — ONDANSETRON 16 MG/50ML IVPB (CHCC)
INTRAVENOUS | Status: AC
  Filled 2014-06-26: qty 16

## 2014-06-26 MED ORDER — SODIUM CHLORIDE 0.9 % IV SOLN
236.0000 mg | Freq: Once | INTRAVENOUS | Status: AC
  Administered 2014-06-26: 240 mg via INTRAVENOUS
  Filled 2014-06-26: qty 24

## 2014-06-26 MED ORDER — ONDANSETRON 16 MG/50ML IVPB (CHCC)
16.0000 mg | Freq: Once | INTRAVENOUS | Status: AC
  Administered 2014-06-26: 16 mg via INTRAVENOUS

## 2014-06-26 MED ORDER — SODIUM CHLORIDE 0.9 % IJ SOLN
10.0000 mL | INTRAMUSCULAR | Status: DC | PRN
  Administered 2014-06-26: 10 mL via INTRAVENOUS
  Filled 2014-06-26: qty 10

## 2014-06-26 MED ORDER — DEXAMETHASONE SODIUM PHOSPHATE 20 MG/5ML IJ SOLN
20.0000 mg | Freq: Once | INTRAMUSCULAR | Status: AC
  Administered 2014-06-26: 20 mg via INTRAVENOUS

## 2014-06-26 MED ORDER — DIPHENHYDRAMINE HCL 50 MG/ML IJ SOLN
INTRAMUSCULAR | Status: AC
  Filled 2014-06-26: qty 1

## 2014-06-26 MED ORDER — FAMOTIDINE IN NACL 20-0.9 MG/50ML-% IV SOLN
INTRAVENOUS | Status: AC
  Filled 2014-06-26: qty 50

## 2014-06-26 MED ORDER — SODIUM CHLORIDE 0.9 % IV SOLN
Freq: Once | INTRAVENOUS | Status: AC
Start: 2014-06-26 — End: 2014-06-26
  Administered 2014-06-26: 12:00:00 via INTRAVENOUS

## 2014-06-26 MED ORDER — HEPARIN SOD (PORK) LOCK FLUSH 100 UNIT/ML IV SOLN
500.0000 [IU] | Freq: Once | INTRAVENOUS | Status: AC | PRN
  Administered 2014-06-26: 500 [IU]
  Filled 2014-06-26: qty 5

## 2014-06-26 MED ORDER — DEXAMETHASONE SODIUM PHOSPHATE 20 MG/5ML IJ SOLN
INTRAMUSCULAR | Status: AC
  Filled 2014-06-26: qty 5

## 2014-06-26 MED ORDER — FAMOTIDINE IN NACL 20-0.9 MG/50ML-% IV SOLN
20.0000 mg | Freq: Once | INTRAVENOUS | Status: AC
  Administered 2014-06-26: 20 mg via INTRAVENOUS

## 2014-06-26 MED ORDER — DIPHENHYDRAMINE HCL 50 MG/ML IJ SOLN
25.0000 mg | Freq: Once | INTRAMUSCULAR | Status: AC
  Administered 2014-06-26: 12:00:00 via INTRAVENOUS

## 2014-06-26 MED ORDER — PACLITAXEL CHEMO INJECTION 300 MG/50ML
64.0000 mg/m2 | Freq: Once | INTRAVENOUS | Status: AC
  Administered 2014-06-26: 138 mg via INTRAVENOUS
  Filled 2014-06-26: qty 23

## 2014-06-26 NOTE — Telephone Encounter (Signed)
per pof to sch pt appt-sent MW email to sch 10/26 to coornidate w/[facility] appt-pt undrstood inf would be moved

## 2014-06-26 NOTE — Telephone Encounter (Signed)
Per staff message I have adjusted 10/26 appt

## 2014-06-26 NOTE — Patient Instructions (Signed)

## 2014-06-26 NOTE — Progress Notes (Signed)
[name]  Telephone:(336) [phone] Fax:(336) [phone]     ID: [name] DOB: [birth year]  MR#: [id number]  TFT#:[id number]  PCP: Ruth Greenland, MD GYN: SU: [name] OTHER MD: [name]  CHIEF COMPLAINT: Stage III breast Lopez, triple negative   CURRENT TREATMENT: Neoadjuvant chemotherapy  BREAST Lopez HISTORY: From Ruth original intake note:  [name] herself palpated a mass in her left breast, and immediately brought it to Ruth attention of her primary care physician, Dr. Baird Lopez, who confirmed Ruth finding and setup Ruth patient for bilateral diagnostic mammography [facility] 02/20/2014. This showed a high density mass in Ruth outer left breast measuring up to 3.7 cm. It corresponded to Ruth site of palpable concern. In addition Ruth normal 4 logically abnormal lymph nodes in Ruth left axilla. Ruth mass was palpable by exam, and by ultrasonography measured 2.6 cm. There were enlarged and morphologically abnormal lymph nodes in Ruth left axilla, largest measuring 3 cm.  Biopsy of Ruth left breast mass in question and 1 of Ruth abnormal axillary lymph nodes 02/22/2014 showed ([id number]) biopsies to be positive for an invasive ductal carcinoma, grade 3, triple negative, with an MIB-1 of 90%. Specifically Ruth HER-2 signals ratio was 1.05, and Ruth number per cell 2.00.  MRI of Ruth breasts 03/01/2014 showed a 3.8 cm enhancing mass with areas of apparent necrosis in Ruth left breast, as well as Ruth biopsy tract extending laterally, Ruth combined measure 5.3 cm. There were no other masses or areas of abnormal enhancement. There were multiple abnormally enlarged left axillary lymph nodes with loss of Ruth normal fatty hilum. These included level I and retropectoral lymph nodes.  Ruth patient's subsequent history is as detailed below   INTERVAL HISTORY: [name] returns today for follow up of her breast Lopez. Today is day 1, cycle 6 of 12 planned weekly cycles of  paclitaxel and carboplatin. She is tolerating treatment well with few complaints. She is fatigued after treatment, but able to work though her chores with occasional breaks. She denies fevers, chills, nausea, or vomiting. She had mild diarrhea on days 3 and 4 of last week, but this has since resolved. She has no mouth sores or decrease in appetite, though her taste changes after treatment. 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   REVIEW OF SYSTEMS: A detailed review of systems is otherwise noncontributory, except where noted above.   PAST MEDICAL HISTORY: Past Medical History  Diagnosis Date  . Colon Lopez   . Hx of rotator cuff surgery     PAST SURGICAL HISTORY: Past Surgical History  Procedure Laterality Date  . Abdominal hysterectomy    . Colon surgery      colectomy/colostomy with takedown    FAMILY HISTORY Family History  Problem Relation Age of Onset  . Lopez Mother 55    breast  . Lopez Maternal Aunt     breast Lopez at unknown age   Ruth patient's father died at Ruth age of 80 following a stroke in Ruth setting of diabetes; Ruth patient's mother is living at 28. Ruth patient has 3 brothers and 2 sisters. Ruth patient's mother, [name], was diagnosed with breast Lopez in her early 4s. There is no other breast or ovarian Lopez in Ruth family. Ruth patient herself was diagnosed with [name] by history he is an incidentally found stage I colon carcinoma noted at Ruth time of her hysterectomy. She had a partial colectomy but no adjuvant treatment. We have no records  regarding that procedure  GYNECOLOGIC HISTORY:  No LMP recorded. Patient has had a hysterectomy. Menarche age 25, first live birth age 74, Ruth patient underwent total abdominal hysterectomy with bilateral salpingo-oophorectomy in her 69s. She did not take hormone replacement. She did not take oral contraceptives at any point.  SOCIAL HISTORY:  [name] work for [employer] for about 40 years, retiring in 2008. She  is divorced and lives alone, with no pets. Her daughter [name] works for [name] in Press [employer]. Ruth second daughter, [name], works as a Research scientist (physical sciences) for [name]. Ruth patient has 6 grandchildren and one great-grandchild. She attends a [name].    ADVANCED DIRECTIVES: Not in place. Ruth patient intends to name her daughter [name]. [name] is work number is [id number]. On Ruth patient's 03/01/2014 visit she was given a copy of Ruth appropriate documents to complete and notarize at her discretion   HEALTH MAINTENANCE: History  Substance Use Topics  . Smoking status: Never Smoker   . Smokeless tobacco: Not on file  . Alcohol Use: No     Colonoscopy: 2000  PAP: May 2015  Bone density: 05/23/2004, at [facility] hospital; normal  Lipid panel:  No Known Allergies  Current Outpatient Prescriptions  Medication Sig Dispense Refill  . acetaminophen (TYLENOL) 500 MG tablet Take 1,000 mg by mouth every 6 (six) hours as needed for mild pain or moderate pain.      Marland Kitchen dexamethasone (DECADRON) 4 MG tablet Take 2 tablets by mouth once a day on Ruth day after chemotherapy and then take 2 tablets two times a day for 2 days. Take with food.  30 tablet  1  . lidocaine-prilocaine (EMLA) cream Apply 1 application topically once. Apply to port 1-2 hours prior to access.  30 g  1  . loteprednol (LOTEMAX) 0.5 % ophthalmic suspension Place 1 drop into both eyes 3 (three) times daily.      . ondansetron (ZOFRAN) 8 MG tablet Take 1 tablet (8 mg total) by mouth 2 (two) times daily as needed. Start on Ruth third day after chemotherapy.  30 tablet  0  . potassium chloride (K-DUR) 10 MEQ tablet Take 1 tablet (10 mEq total) by mouth 2 (two) times daily.  60 tablet  4  . prochlorperazine (COMPAZINE) 10 MG tablet Take 1 tablet (10 mg total) by mouth every 6 (six) hours as needed for nausea or vomiting.  30 tablet  0  . tobramycin-dexamethasone (TOBRADEX) ophthalmic  solution Place 1 drop into both eyes every 4 (four) hours while awake.  5 mL  0   No current facility-administered medications for this visit.    OBJECTIVE: Middle-aged [place] American woman who appears stated age 41 Vitals:   06/26/14 1010  BP: 119/91  Pulse: 108  Temp: 99.1 F (37.3 C)  Resp: 20     Body mass index is 44.75 kg/(m^2).    ECOG FS:1 - Symptomatic but completely ambulatory    Skin: warm, dry, nail beds and bilateral palms hyperpigmented.  Sclerae unicteric, pupils equal and reactive Oropharynx clear and moist-- no thrush No cervical or supraclavicular adenopathy Lungs no rales or rhonchi Heart regular rate and rhythm Abd soft, nontender, positive bowel sounds MSK no focal spinal tenderness, no upper extremity lymphedema Neuro: nonfocal, well oriented, appropriate affect Breasts: deferred  LAB RESULTS:  I No results found for this basename: SPEP,  UPEP,   kappa and lambda light chains    Lab Results  Component Value  Date   WBC 3.3* 06/26/2014   NEUTROABS 2.1 06/26/2014   HGB 9.4* 06/26/2014   HCT 29.2* 06/26/2014   MCV 98.5 06/26/2014   PLT 275 06/26/2014      Chemistry      Component Value Date/Time   NA 143 06/26/2014 0948   NA 143 04/24/2014 0958   K 3.5 06/26/2014 0948   K 3.5 04/24/2014 0958   CL 105 04/24/2014 0958   CO2 23 06/26/2014 0948   CO2 25 04/24/2014 0958   BUN 4.3* 06/26/2014 0948   BUN 12 04/24/2014 0958   CREATININE 0.8 06/26/2014 0948   CREATININE 0.93 04/24/2014 0958      Component Value Date/Time   CALCIUM 9.6 06/26/2014 0948   CALCIUM 9.3 04/24/2014 0958   ALKPHOS 115 06/26/2014 0948   ALKPHOS 121* 04/24/2014 0958   AST 23 06/26/2014 0948   AST 13 04/24/2014 0958   ALT 34 06/26/2014 0948   ALT 25 04/24/2014 0958   BILITOT 1.06 06/26/2014 0948   BILITOT 0.3 04/24/2014 0958       No results found for this basename: LABCA2    No components found with this basename: [id number]    No results found for this basename:  INR,  in Ruth last 168 hours  Urinalysis    Component Value Date/Time   COLORURINE YELLOW 04/05/2008 1120   APPEARANCEUR CLEAR 04/05/2008 1120   LABSPEC 1.021 04/05/2008 1120   PHURINE 6.0 04/05/2008 1120   GLUCOSEU NEGATIVE 04/05/2008 1120   HGBUR NEGATIVE 04/05/2008 1120   BILIRUBINUR NEGATIVE 04/05/2008 1120   KETONESUR NEGATIVE 04/05/2008 1120   PROTEINUR NEGATIVE 04/05/2008 1120   UROBILINOGEN 0.2 04/05/2008 1120   NITRITE NEGATIVE 04/05/2008 1120   LEUKOCYTESUR NEGATIVE MICROSCOPIC NOT DONE ON URINES WITH NEGATIVE PROTEIN, BLOOD, LEUKOCYTES, NITRITE, OR GLUCOSE <1000 mg/dL. 04/05/2008 1120    STUDIES: No results found.  ASSESSMENT: 63 y.o. [place] woman s/p breast and left axillary lymph node biopsy 02/22/2014, both positive for a clinical T2 N2, stage IIIA invasive ductal carcinoma, grade 3, triple negative, with an MIB-1 of 90%  (1) neoadjuvant chemotherapy started 03/13/2014, with cyclophosphamide and doxorubicin in dose dense fashion x4, with Neulasta support on day 2, completed 04/24/2014, to be followed by weekly carboplatin and paclitaxel x12  (2) definitive surgery to follow chemotherapy  (3) radiation to follow surgery  (4) remote history of partial colectomy for early stage colon Lopez incidentally found during TAH/BSO in Ruth 1980s  (5) genetics testing since 04/03/2014, 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 Ruth labs were reviewed in detail, and her total bili continues to trend down. She is having treatment related anemia that continues to worsen, but besides mild fatigue, she is asymptomatic. We will continue to monitor this value, and she will call us in advance of her next appointment if she experiences shortness of breath or dizziness. Her  carboplatin and paclitaxel were dose reduced by 20% last week. We will proceed with cycle 6 today.   [name] will return next week for labs, an office visit, and Ruth start of cycle 7. [name] understands and agrees with this plan. She knows Ruth goal of treatment in her case is cure. She has been encouraged to call with any issues that might arise before her next visit here.  Ruth Duster, NP   06/26/2014 10:46 AM

## 2014-06-26 NOTE — Patient Instructions (Signed)
[facility] Discharge Instructions for Patients Receiving Chemotherapy  Today you received the following chemotherapy agents Taxol, Carboplatin  To help prevent nausea and vomiting after your treatment, we encourage you to take your nausea medication as directed.   If you develop nausea and vomiting that is not controlled by your nausea medication, call the clinic.   BELOW ARE SYMPTOMS THAT SHOULD BE REPORTED IMMEDIATELY:  *FEVER GREATER THAN 100.5 F  *CHILLS WITH OR WITHOUT FEVER  NAUSEA AND VOMITING THAT IS NOT CONTROLLED WITH YOUR NAUSEA MEDICATION  *UNUSUAL SHORTNESS OF BREATH  *UNUSUAL BRUISING OR BLEEDING  TENDERNESS IN MOUTH AND THROAT WITH OR WITHOUT PRESENCE OF ULCERS  *URINARY PROBLEMS  *BOWEL PROBLEMS  UNUSUAL RASH Items with * indicate a potential emergency and should be followed up as soon as possible.  Feel free to call the clinic you have any questions or concerns. The clinic phone number is [phone].    

## 2014-06-27 ENCOUNTER — Ambulatory Visit: Payer: Medicare Other

## 2014-06-27 NOTE — Telephone Encounter (Signed)
NONE 

## 2014-06-30 ENCOUNTER — Other Ambulatory Visit: Payer: Self-pay

## 2014-07-03 ENCOUNTER — Ambulatory Visit: Payer: Medicare Other

## 2014-07-03 ENCOUNTER — Ambulatory Visit: Payer: Medicare Other | Admitting: Adult Health

## 2014-07-03 ENCOUNTER — Other Ambulatory Visit: Payer: Medicare Other

## 2014-07-03 ENCOUNTER — Other Ambulatory Visit (HOSPITAL_BASED_OUTPATIENT_CLINIC_OR_DEPARTMENT_OTHER): Payer: Medicare Other

## 2014-07-03 ENCOUNTER — Encounter: Payer: Self-pay | Admitting: Nurse Practitioner

## 2014-07-03 ENCOUNTER — Telehealth: Payer: Self-pay | Admitting: Oncology

## 2014-07-03 ENCOUNTER — Ambulatory Visit (HOSPITAL_BASED_OUTPATIENT_CLINIC_OR_DEPARTMENT_OTHER): Payer: Medicare Other | Admitting: Nurse Practitioner

## 2014-07-03 ENCOUNTER — Ambulatory Visit: Payer: Medicare Other | Admitting: Nurse Practitioner

## 2014-07-03 VITALS — BP 116/81 | HR 101 | Temp 98.7°F | Resp 18 | Ht 61.0 in | Wt 231.9 lb

## 2014-07-03 DIAGNOSIS — R822 Biliuria: Secondary | ICD-10-CM

## 2014-07-03 DIAGNOSIS — C50812 Malignant neoplasm of overlapping sites of left female breast: Secondary | ICD-10-CM

## 2014-07-03 DIAGNOSIS — Z95828 Presence of other vascular implants and grafts: Secondary | ICD-10-CM

## 2014-07-03 DIAGNOSIS — C50412 Malignant neoplasm of upper-outer quadrant of left female breast: Secondary | ICD-10-CM

## 2014-07-03 DIAGNOSIS — D709 Neutropenia, unspecified: Secondary | ICD-10-CM

## 2014-07-03 DIAGNOSIS — C773 Secondary and unspecified malignant neoplasm of axilla and upper limb lymph nodes: Secondary | ICD-10-CM | POA: Diagnosis not present

## 2014-07-03 DIAGNOSIS — Z171 Estrogen receptor negative status [ER-]: Secondary | ICD-10-CM | POA: Diagnosis not present

## 2014-07-03 DIAGNOSIS — D6481 Anemia due to antineoplastic chemotherapy: Secondary | ICD-10-CM

## 2014-07-03 DIAGNOSIS — D61810 Antineoplastic chemotherapy induced pancytopenia: Secondary | ICD-10-CM

## 2014-07-03 DIAGNOSIS — E876 Hypokalemia: Secondary | ICD-10-CM

## 2014-07-03 DIAGNOSIS — T451X5A Adverse effect of antineoplastic and immunosuppressive drugs, initial encounter: Secondary | ICD-10-CM

## 2014-07-03 LAB — COMPREHENSIVE METABOLIC PANEL (CC13)
ALBUMIN: 3.5 g/dL (ref 3.5–5.0)
ALT: 49 U/L (ref 0–55)
AST: 27 U/L (ref 5–34)
Alkaline Phosphatase: 109 U/L (ref 40–150)
Anion Gap: 11 mEq/L (ref 3–11)
BUN: 5.7 mg/dL — AB (ref 7.0–26.0)
CO2: 24 mEq/L (ref 22–29)
Calcium: 9.9 mg/dL (ref 8.4–10.4)
Chloride: 107 mEq/L (ref 98–109)
Creatinine: 0.9 mg/dL (ref 0.6–1.1)
Glucose: 105 mg/dl (ref 70–140)
POTASSIUM: 3.3 meq/L — AB (ref 3.5–5.1)
Sodium: 142 mEq/L (ref 136–145)
Total Bilirubin: 1.31 mg/dL — ABNORMAL HIGH (ref 0.20–1.20)
Total Protein: 6.8 g/dL (ref 6.4–8.3)

## 2014-07-03 LAB — CBC WITH DIFFERENTIAL/PLATELET
BASO%: 1 % (ref 0.0–2.0)
Basophils Absolute: 0 10*3/uL (ref 0.0–0.1)
EOS%: 1.5 % (ref 0.0–7.0)
Eosinophils Absolute: 0 10*3/uL (ref 0.0–0.5)
HCT: 29.1 % — ABNORMAL LOW (ref 34.8–46.6)
HGB: 9.7 g/dL — ABNORMAL LOW (ref 11.6–15.9)
LYMPH%: 45.4 % (ref 14.0–49.7)
MCH: 31.7 pg (ref 25.1–34.0)
MCHC: 33.3 g/dL (ref 31.5–36.0)
MCV: 95.1 fL (ref 79.5–101.0)
MONO#: 0.2 10*3/uL (ref 0.1–0.9)
MONO%: 9.8 % (ref 0.0–14.0)
NEUT#: 0.8 10*3/uL — ABNORMAL LOW (ref 1.5–6.5)
NEUT%: 42.3 % (ref 38.4–76.8)
Platelets: 253 10*3/uL (ref 145–400)
RBC: 3.06 10*6/uL — AB (ref 3.70–5.45)
RDW: 14.5 % (ref 11.2–14.5)
WBC: 1.9 10*3/uL — ABNORMAL LOW (ref 3.9–10.3)
lymph#: 0.9 10*3/uL (ref 0.9–3.3)

## 2014-07-03 MED ORDER — SODIUM CHLORIDE 0.9 % IJ SOLN
10.0000 mL | INTRAMUSCULAR | Status: DC | PRN
  Administered 2014-07-03: 10 mL via INTRAVENOUS
  Filled 2014-07-03: qty 10

## 2014-07-03 NOTE — Patient Instructions (Signed)

## 2014-07-03 NOTE — Telephone Encounter (Signed)
per pof to sch pt appt-sent MW email to sch trmt-Pt has MY CHART and will review for updated sch

## 2014-07-03 NOTE — Telephone Encounter (Signed)
pt [place] already sch

## 2014-07-03 NOTE — Progress Notes (Signed)
[facility]  Telephone:(336) [phone] Fax:(336) [phone]     ID: [name] DOB: October 23, 1950  MR#: [id number]  JID#:[id number]  PCP: Gwynneth Aliment, MD GYN: SU: Emelia Loron OTHER MD: Antony Blackbird  CHIEF COMPLAINT: Stage III breast cancer, triple negative   CURRENT TREATMENT: Neoadjuvant chemotherapy  BREAST CANCER HISTORY: From the original intake note:  [name] herself palpated a mass in her left breast, and immediately brought it to the attention of her primary care physician, Dr. Allyne Gee, who confirmed the finding and setup the patient for bilateral diagnostic mammography at the breast Center 02/20/2014. This showed a high density mass in the outer left breast measuring up to 3.7 cm. It corresponded to the site of palpable concern. In addition the normal 4 logically abnormal lymph nodes in the left axilla. The mass was palpable by exam, and by ultrasonography measured 2.6 cm. There were enlarged and morphologically abnormal lymph nodes in the left axilla, largest measuring 3 cm.  Biopsy of the left breast mass in question and 1 of the abnormal axillary lymph nodes 02/22/2014 showed ([id number]) biopsies to be positive for an invasive ductal carcinoma, grade 3, triple negative, with an MIB-1 of 90%. Specifically the HER-2 signals ratio was 1.05, and the number per cell 2.00.  MRI of the breasts 03/01/2014 showed a 3.8 cm enhancing mass with areas of apparent necrosis in the left breast, as well as the biopsy tract extending laterally, the combined measure 5.3 cm. There were no other masses or areas of abnormal enhancement. There were multiple abnormally enlarged left axillary lymph nodes with loss of the normal fatty hilum. These included level I and retropectoral lymph nodes.  The patient's subsequent history is as detailed below   INTERVAL HISTORY: [name] returns today for follow up of her breast cancer. Today is day 1, cycle 7 of 12 planned weekly cycles of  paclitaxel and carboplatin. She continues to tolerate treatment well with few complaints. This week she had some constipation, but this resolved with the use of stool softeners. 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   REVIEW OF SYSTEMS: [name] denies fevers, chills, nausea, or vomiting. She has no mouth sores or decrease in appetite, though her taste sensation is changed after treatment. She has no shortness of breath, chest pain, cough, or palpitations. She has some mild fatigue, but she is able to complete her chores with occasional complaints. A detailed review of systems is otherwise noncontributory.   PAST MEDICAL HISTORY: Past Medical History  Diagnosis Date  . Colon cancer   . Hx of rotator cuff surgery     PAST SURGICAL HISTORY: Past Surgical History  Procedure Laterality Date  . Abdominal hysterectomy    . Colon surgery      colectomy/colostomy with takedown    FAMILY HISTORY Family History  Problem Relation Age of Onset  . Cancer Mother 42    breast  . Cancer Maternal Aunt     breast cancer at unknown age   the patient's father died at the age of 15 following a stroke in the setting of diabetes; the patient's mother is living at 71. The patient has 3 brothers and 2 sisters. [name], [name], was diagnosed with breast cancer in her early 75s. There is no other breast or ovarian cancer in the family. The patient herself was diagnosed with watts by history he is an incidentally found stage I colon carcinoma noted at the time of her hysterectomy. She had a  partial colectomy but no adjuvant treatment. We have no records regarding that procedure  GYNECOLOGIC HISTORY:  No LMP recorded. Patient has had a hysterectomy. Menarche age 81, first live birth age 20, the patient underwent total abdominal hysterectomy with bilateral salpingo-oophorectomy in her 56s. She did not take hormone replacement. She did not take oral contraceptives at any  point.  SOCIAL HISTORY:  [name] work for [employer] for about 40 years, retiring in 2008. She is divorced and lives alone, with no pets. Her daughter [name] works for [employer] in Press photographer. The second daughter, [name], works as a Research scientist (physical sciences) for [employer]. The patient has 6 grandchildren and one great-grandchild. She attends a [employer].    ADVANCED DIRECTIVES: Not in place. The patient intends to name her daughter [name]. [name] is work number is [id number]. On the patient's 03/01/2014 visit she was given a copy of the appropriate documents to complete and notarize at her discretion   HEALTH MAINTENANCE: History  Substance Use Topics  . Smoking status: Never Smoker   . Smokeless tobacco: Not on file  . Alcohol Use: No     Colonoscopy: 2000  PAP: May 2015  Bone density: 05/23/2004, at [facility] hospital; normal  Lipid panel:  No Known Allergies  Current Outpatient Prescriptions  Medication Sig Dispense Refill  . dexamethasone (DECADRON) 4 MG tablet Take 2 tablets by mouth once a day on the day after chemotherapy and then take 2 tablets two times a day for 2 days. Take with food.  30 tablet  1  . lidocaine-prilocaine (EMLA) cream Apply 1 application topically once. Apply to port 1-2 hours prior to access.  30 g  1  . loteprednol (LOTEMAX) 0.5 % ophthalmic suspension Place 1 drop into both eyes 3 (three) times daily.      . ondansetron (ZOFRAN) 8 MG tablet Take 1 tablet (8 mg total) by mouth 2 (two) times daily as needed. Start on the third day after chemotherapy.  30 tablet  0  . potassium chloride (K-DUR) 10 MEQ tablet Take 1 tablet (10 mEq total) by mouth 2 (two) times daily.  60 tablet  4  . tobramycin-dexamethasone (TOBRADEX) ophthalmic solution Place 1 drop into both eyes every 4 (four) hours while awake.  5 mL  0  . acetaminophen (TYLENOL) 500 MG tablet Take 1,000 mg by mouth every 6 (six) hours as needed for mild pain or  moderate pain.      Marland Kitchen prochlorperazine (COMPAZINE) 10 MG tablet Take 1 tablet (10 mg total) by mouth every 6 (six) hours as needed for nausea or vomiting.  30 tablet  0   No current facility-administered medications for this visit.    OBJECTIVE: Middle-aged [place] American woman who appears stated age 40 Vitals:   07/03/14 1054  BP: 116/81  Pulse: 101  Temp: 98.7 F (37.1 C)  Resp: 18     Body mass index is 43.84 kg/(m^2).    ECOG FS:1 - Symptomatic but completely ambulatory    Skin: warm, dry, bilateral palms and nail beds hyperpigmented HEENT: sclerae anicteric, conjunctivae pink, oropharynx clear. No thrush or mucositis.  Lymph Nodes: No cervical or supraclavicular lymphadenopathy  Lungs: clear to auscultation bilaterally, no rales, wheezes, or rhonci  Heart: regular rate and rhythm  Abdomen: round, soft, non tender, positive bowel sounds  Musculoskeletal: No focal spinal tenderness, no peripheral edema  Neuro: non focal, well oriented, positive affect  Breasts:deferred, bilateral axillae benign  LAB RESULTS:  I No results found for this basename: SPEP,  UPEP,   kappa and lambda light chains    Lab Results  Component Value Date   WBC 1.9* 07/03/2014   NEUTROABS 0.8* 07/03/2014   HGB 9.7* 07/03/2014   HCT 29.1* 07/03/2014   MCV 95.1 07/03/2014   PLT 253 07/03/2014      Chemistry      Component Value Date/Time   NA 142 07/03/2014 0925   NA 143 04/24/2014 0958   K 3.3* 07/03/2014 0925   K 3.5 04/24/2014 0958   CL 105 04/24/2014 0958   CO2 24 07/03/2014 0925   CO2 25 04/24/2014 0958   BUN 5.7* 07/03/2014 0925   BUN 12 04/24/2014 0958   CREATININE 0.9 07/03/2014 0925   CREATININE 0.93 04/24/2014 0958      Component Value Date/Time   CALCIUM 9.9 07/03/2014 0925   CALCIUM 9.3 04/24/2014 0958   ALKPHOS 109 07/03/2014 0925   ALKPHOS 121* 04/24/2014 0958   AST 27 07/03/2014 0925   AST 13 04/24/2014 0958   ALT 49 07/03/2014 0925   ALT 25 04/24/2014 0958   BILITOT  1.31* 07/03/2014 0925   BILITOT 0.3 04/24/2014 0958       No results found for this basename: LABCA2    No components found with this basename: [id number]    No results found for this basename: INR,  in the last 168 hours  Urinalysis    Component Value Date/Time   COLORURINE YELLOW 04/05/2008 1120   APPEARANCEUR CLEAR 04/05/2008 1120   LABSPEC 1.021 04/05/2008 1120   PHURINE 6.0 04/05/2008 1120   GLUCOSEU NEGATIVE 04/05/2008 1120   HGBUR NEGATIVE 04/05/2008 1120   BILIRUBINUR NEGATIVE 04/05/2008 1120   KETONESUR NEGATIVE 04/05/2008 1120   PROTEINUR NEGATIVE 04/05/2008 1120   UROBILINOGEN 0.2 04/05/2008 1120   NITRITE NEGATIVE 04/05/2008 1120   LEUKOCYTESUR NEGATIVE MICROSCOPIC NOT DONE ON URINES WITH NEGATIVE PROTEIN, BLOOD, LEUKOCYTES, NITRITE, OR GLUCOSE <1000 mg/dL. 04/05/2008 1120    STUDIES: No results found.  ASSESSMENT: 63 y.o. [place] woman s/p breast and left axillary lymph node biopsy 02/22/2014, both positive for a clinical T2 N2, stage IIIA invasive ductal carcinoma, grade 3, triple negative, with an MIB-1 of 90%  (1) neoadjuvant chemotherapy started 03/13/2014, with cyclophosphamide and doxorubicin in dose dense fashion x4, with Neulasta support on day 2, completed 04/24/2014, to be followed by weekly carboplatin and paclitaxel x12, paclitaxel reduced during cycle 5 and cycle 7 because of elevated bilirubin levels  (2) definitive surgery to follow chemotherapy  (3) radiation to follow surgery  (4) remote history of partial colectomy for early stage colon cancer incidentally found during TAH/BSO in the 1980s  (5) genetics testing since 04/03/2014, demonstrated a pathogenic mutation in the PALB2 gene  (6) staging studies showed right-sided lung nodules, too small to characterize, and a dominant right-sided thyroid nodule unchanged in size as compared to 2011; to be followed  PLAN: Lamis feels good today. Unfortunately, the labs were reviewed in detail, and again her  total bili count is up this week. I consulted with Dr. Jana Hakim and he would like to hold chemotherapy this week, then have the paclitaxel reduced by another 10% with the next treatment. Ltanya's ANC is now 800 so I advised her to maintain good hand hygiene, avoid crowds and sick contacts, and to call if her temperature reaches 100.68F or greater. Her treatment related anemia continues, but beside fatigue, she his asymptomatic. We will continue to monitor this value.  As  for her hypokalemia, she is already on 23mq potassium daily, so she will take an extra dose of the 169m daily x 3 days.  LiAddieill return next week for labs, an office visit, and the restart of cycle 7 with the aforementioned dose reductions. LiTerilynnnderstands and agrees with this plan. She knows the goal of treatment in her case is cure. She has been encouraged to call with any issues that might arise before her next visit here.    FeMarcelino DusterNP   07/03/2014 11:05 AM

## 2014-07-04 ENCOUNTER — Telehealth: Payer: Self-pay | Admitting: *Deleted

## 2014-07-04 ENCOUNTER — Ambulatory Visit: Payer: Medicare Other

## 2014-07-04 NOTE — Telephone Encounter (Signed)
Per staff message and POF I have scheduled appts. Advised scheduler of appts. JMW  

## 2014-07-10 ENCOUNTER — Other Ambulatory Visit (HOSPITAL_BASED_OUTPATIENT_CLINIC_OR_DEPARTMENT_OTHER): Payer: Medicare Other

## 2014-07-10 ENCOUNTER — Ambulatory Visit: Payer: Medicare Other

## 2014-07-10 ENCOUNTER — Telehealth: Payer: Self-pay | Admitting: Oncology

## 2014-07-10 ENCOUNTER — Ambulatory Visit (HOSPITAL_BASED_OUTPATIENT_CLINIC_OR_DEPARTMENT_OTHER): Payer: Medicare Other | Admitting: Oncology

## 2014-07-10 ENCOUNTER — Encounter: Payer: Self-pay | Admitting: Oncology

## 2014-07-10 ENCOUNTER — Ambulatory Visit (HOSPITAL_BASED_OUTPATIENT_CLINIC_OR_DEPARTMENT_OTHER): Payer: Medicare Other

## 2014-07-10 ENCOUNTER — Other Ambulatory Visit: Payer: Medicare Other

## 2014-07-10 ENCOUNTER — Other Ambulatory Visit: Payer: Self-pay | Admitting: Oncology

## 2014-07-10 VITALS — BP 122/98 | HR 102 | Temp 98.2°F | Resp 19 | Ht 61.0 in | Wt 236.5 lb

## 2014-07-10 DIAGNOSIS — C773 Secondary and unspecified malignant neoplasm of axilla and upper limb lymph nodes: Secondary | ICD-10-CM

## 2014-07-10 DIAGNOSIS — C50812 Malignant neoplasm of overlapping sites of left female breast: Secondary | ICD-10-CM

## 2014-07-10 DIAGNOSIS — Z171 Estrogen receptor negative status [ER-]: Secondary | ICD-10-CM

## 2014-07-10 DIAGNOSIS — E041 Nontoxic single thyroid nodule: Secondary | ICD-10-CM

## 2014-07-10 DIAGNOSIS — R918 Other nonspecific abnormal finding of lung field: Secondary | ICD-10-CM | POA: Diagnosis not present

## 2014-07-10 DIAGNOSIS — Z95828 Presence of other vascular implants and grafts: Secondary | ICD-10-CM

## 2014-07-10 DIAGNOSIS — C50412 Malignant neoplasm of upper-outer quadrant of left female breast: Secondary | ICD-10-CM

## 2014-07-10 DIAGNOSIS — Z5111 Encounter for antineoplastic chemotherapy: Secondary | ICD-10-CM | POA: Diagnosis not present

## 2014-07-10 LAB — CBC WITH DIFFERENTIAL/PLATELET
BASO%: 0.7 % (ref 0.0–2.0)
Basophils Absolute: 0 10*3/uL (ref 0.0–0.1)
EOS ABS: 0 10*3/uL (ref 0.0–0.5)
EOS%: 0.8 % (ref 0.0–7.0)
HEMATOCRIT: 29.7 % — AB (ref 34.8–46.6)
HGB: 9.7 g/dL — ABNORMAL LOW (ref 11.6–15.9)
LYMPH%: 28.7 % (ref 14.0–49.7)
MCH: 31.9 pg (ref 25.1–34.0)
MCHC: 32.6 g/dL (ref 31.5–36.0)
MCV: 97.8 fL (ref 79.5–101.0)
MONO#: 0.4 10*3/uL (ref 0.1–0.9)
MONO%: 14.6 % — AB (ref 0.0–14.0)
NEUT%: 55.2 % (ref 38.4–76.8)
NEUTROS ABS: 1.3 10*3/uL — AB (ref 1.5–6.5)
PLATELETS: 251 10*3/uL (ref 145–400)
RBC: 3.03 10*6/uL — AB (ref 3.70–5.45)
RDW: 16.2 % — ABNORMAL HIGH (ref 11.2–14.5)
WBC: 2.4 10*3/uL — ABNORMAL LOW (ref 3.9–10.3)
lymph#: 0.7 10*3/uL — ABNORMAL LOW (ref 0.9–3.3)

## 2014-07-10 LAB — COMPREHENSIVE METABOLIC PANEL (CC13)
ALK PHOS: 105 U/L (ref 40–150)
ALT: 32 U/L (ref 0–55)
ANION GAP: 10 meq/L (ref 3–11)
AST: 21 U/L (ref 5–34)
Albumin: 3.2 g/dL — ABNORMAL LOW (ref 3.5–5.0)
BILIRUBIN TOTAL: 1.33 mg/dL — AB (ref 0.20–1.20)
BUN: 6.5 mg/dL — AB (ref 7.0–26.0)
CO2: 23 mEq/L (ref 22–29)
CREATININE: 0.8 mg/dL (ref 0.6–1.1)
Calcium: 9.5 mg/dL (ref 8.4–10.4)
Chloride: 110 mEq/L — ABNORMAL HIGH (ref 98–109)
GLUCOSE: 99 mg/dL (ref 70–140)
Potassium: 3.6 mEq/L (ref 3.5–5.1)
SODIUM: 143 meq/L (ref 136–145)
TOTAL PROTEIN: 6.5 g/dL (ref 6.4–8.3)

## 2014-07-10 MED ORDER — DEXAMETHASONE SODIUM PHOSPHATE 20 MG/5ML IJ SOLN
INTRAMUSCULAR | Status: AC
  Filled 2014-07-10: qty 5

## 2014-07-10 MED ORDER — ONDANSETRON 16 MG/50ML IVPB (CHCC)
INTRAVENOUS | Status: AC
Start: 2014-07-10 — End: 2014-07-10
  Filled 2014-07-10: qty 16

## 2014-07-10 MED ORDER — SODIUM CHLORIDE 0.9 % IJ SOLN
10.0000 mL | INTRAMUSCULAR | Status: DC | PRN
  Administered 2014-07-10: 10 mL
  Filled 2014-07-10: qty 10

## 2014-07-10 MED ORDER — DIPHENHYDRAMINE HCL 50 MG/ML IJ SOLN
INTRAMUSCULAR | Status: AC
  Filled 2014-07-10: qty 1

## 2014-07-10 MED ORDER — ONDANSETRON 16 MG/50ML IVPB (CHCC)
16.0000 mg | Freq: Once | INTRAVENOUS | Status: AC
  Administered 2014-07-10: 16 mg via INTRAVENOUS

## 2014-07-10 MED ORDER — FAMOTIDINE IN NACL 20-0.9 MG/50ML-% IV SOLN
INTRAVENOUS | Status: AC
  Filled 2014-07-10: qty 50

## 2014-07-10 MED ORDER — DEXAMETHASONE SODIUM PHOSPHATE 20 MG/5ML IJ SOLN
20.0000 mg | Freq: Once | INTRAMUSCULAR | Status: AC
  Administered 2014-07-10: 20 mg via INTRAVENOUS

## 2014-07-10 MED ORDER — DIPHENHYDRAMINE HCL 50 MG/ML IJ SOLN
25.0000 mg | Freq: Once | INTRAMUSCULAR | Status: AC
  Administered 2014-07-10: 25 mg via INTRAVENOUS

## 2014-07-10 MED ORDER — SODIUM CHLORIDE 0.9 % IJ SOLN
10.0000 mL | INTRAMUSCULAR | Status: DC | PRN
  Administered 2014-07-10: 10 mL via INTRAVENOUS
  Filled 2014-07-10: qty 10

## 2014-07-10 MED ORDER — SODIUM CHLORIDE 0.9 % IV SOLN
Freq: Once | INTRAVENOUS | Status: AC
  Administered 2014-07-10: 10:00:00 via INTRAVENOUS

## 2014-07-10 MED ORDER — PACLITAXEL CHEMO INJECTION 300 MG/50ML
56.0000 mg/m2 | Freq: Once | INTRAVENOUS | Status: AC
  Administered 2014-07-10: 120 mg via INTRAVENOUS
  Filled 2014-07-10: qty 20

## 2014-07-10 MED ORDER — FAMOTIDINE IN NACL 20-0.9 MG/50ML-% IV SOLN
20.0000 mg | Freq: Once | INTRAVENOUS | Status: AC
  Administered 2014-07-10: 20 mg via INTRAVENOUS

## 2014-07-10 MED ORDER — HEPARIN SOD (PORK) LOCK FLUSH 100 UNIT/ML IV SOLN
500.0000 [IU] | Freq: Once | INTRAVENOUS | Status: AC | PRN
  Administered 2014-07-10: 500 [IU]
  Filled 2014-07-10: qty 5

## 2014-07-10 MED ORDER — SODIUM CHLORIDE 0.9 % IV SOLN
214.2400 mg | Freq: Once | INTRAVENOUS | Status: AC
  Administered 2014-07-10: 210 mg via INTRAVENOUS
  Filled 2014-07-10: qty 21

## 2014-07-10 NOTE — Patient Instructions (Addendum)
[facility] Discharge Instructions for Patients Receiving Chemotherapy  Today you received the following chemotherapy agents Taxol and Carboplatin.  To help prevent nausea and vomiting after your treatment, we encourage you to take your nausea medication zofran 8 mg every 12 hours and or compazine 10 mg every 6 hours as needed.   If you develop nausea and vomiting that is not controlled by your nausea medication, call the clinic.   BELOW ARE SYMPTOMS THAT SHOULD BE REPORTED IMMEDIATELY:  *FEVER GREATER THAN 100.5 F  *CHILLS WITH OR WITHOUT FEVER  NAUSEA AND VOMITING THAT IS NOT CONTROLLED WITH YOUR NAUSEA MEDICATION  *UNUSUAL SHORTNESS OF BREATH  *UNUSUAL BRUISING OR BLEEDING  TENDERNESS IN MOUTH AND THROAT WITH OR WITHOUT PRESENCE OF ULCERS  *URINARY PROBLEMS  *BOWEL PROBLEMS  UNUSUAL RASH Items with * indicate a potential emergency and should be followed up as soon as possible.  Feel free to call the clinic you have any questions or concerns. The clinic phone number is [phone].

## 2014-07-10 NOTE — Telephone Encounter (Signed)
added inj appts for 10/27 thru 10/29 per 10/26 pof. all other appts remain the same.

## 2014-07-10 NOTE — Progress Notes (Signed)
[name]  Telephone:(336) [phone] Fax:(336) [phone]     ID: [name] DOB: Jan 21, 1951  MR#: [id number]  FMB#:[id number]  PCP: Ruth Greenland, MD GYN: SU: [name] OTHER MD: [name]  CHIEF COMPLAINT: Stage III breast Lopez, triple negative   CURRENT TREATMENT: Neoadjuvant chemotherapy  BREAST Lopez HISTORY: From the original intake note:  [name] herself palpated a mass in her left breast, and immediately brought it to the attention of her primary care physician, Dr. Baird Lopez, who confirmed the finding and setup the patient for bilateral diagnostic mammography at the breast Center 02/20/2014. This showed a high density mass in the outer left breast measuring up to 3.7 cm. It corresponded to the site of palpable concern. In addition the normal 4 logically abnormal lymph nodes in the left axilla. The mass was palpable by exam, and by ultrasonography measured 2.6 cm. There were enlarged and morphologically abnormal lymph nodes in the left axilla, largest measuring 3 cm.  Biopsy of the left breast mass in question and 1 of the abnormal axillary lymph nodes 02/22/2014 showed ([id number]) biopsies to be positive for an invasive ductal carcinoma, grade 3, triple negative, with an MIB-1 of 90%. Specifically the HER-2 signals ratio was 1.05, and the number per cell 2.00.  MRI of the breasts 03/01/2014 showed a 3.8 cm enhancing mass with areas of apparent necrosis in the left breast, as well as the biopsy tract extending laterally, the combined measure 5.3 cm. There were no other masses or areas of abnormal enhancement. There were multiple abnormally enlarged left axillary lymph nodes with loss of the normal fatty hilum. These included level I and retropectoral lymph nodes.  The patient's subsequent history is as detailed below   INTERVAL HISTORY: [name] returns today for follow up of her breast Lopez. Today is day 1, cycle 7 of 12 planned weekly cycles of  paclitaxel and carboplatin. Chemo was held last week due to neutropenia. She continues to tolerate treatment well with few complaints. 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   REVIEW OF SYSTEMS: [name] denies fevers, chills, nausea, or vomiting. She has no mouth sores or decrease in appetite, though her taste sensation is changed after treatment. She has no shortness of breath, chest pain, cough, or palpitations. She has some mild fatigue, but she is able to complete her chores with occasional complaints. A detailed review of systems is otherwise noncontributory.   PAST MEDICAL HISTORY: Past Medical History  Diagnosis Date  . Colon Lopez   . Hx of rotator cuff surgery     PAST SURGICAL HISTORY: Past Surgical History  Procedure Laterality Date  . Abdominal hysterectomy    . Colon surgery      colectomy/colostomy with takedown    FAMILY HISTORY Family History  Problem Relation Age of Onset  . Lopez Mother 38    breast  . Lopez Maternal Aunt     breast Lopez at unknown age   the patient's father died at the age of 3 following a stroke in the setting of diabetes; the patient's mother is living at 6. The patient has 3 brothers and 2 sisters. [name], [name], was diagnosed with breast Lopez in her early 52s. There is no other breast or ovarian Lopez in the family. The patient herself was diagnosed with watts by history he is an incidentally found stage I colon carcinoma noted at the time of her hysterectomy. She had a partial colectomy but no adjuvant treatment. We  have no records regarding that procedure  GYNECOLOGIC HISTORY:  No LMP recorded. Patient has had a hysterectomy. Menarche age 29, first live birth age 63, the patient underwent total abdominal hysterectomy with bilateral salpingo-oophorectomy in her 75s. She did not take hormone replacement. She did not take oral contraceptives at any point.  SOCIAL HISTORY:  [name] work for [employer] for  about 40 years, retiring in 2008. She is divorced and lives alone, with no pets. Her daughter [name] works for [employer] in Press [employer]. The second daughter, [name], works as a Research scientist (physical sciences) for [employer]. The patient has 6 grandchildren and one great-grandchild. She attends a [employer].    ADVANCED DIRECTIVES: Not in place. The patient intends to name her daughter [name]. [name] is work number is [id number]. On the patient's 03/01/2014 visit she was given a copy of the appropriate documents to complete and notarize at her discretion   HEALTH MAINTENANCE: History  Substance Use Topics  . Smoking status: Never Smoker   . Smokeless tobacco: Not on file  . Alcohol Use: No     Colonoscopy: 2000  PAP: May 2015  Bone density: 05/23/2004, at [facility] hospital; normal  Lipid panel:  No Known Allergies  Current Outpatient Prescriptions  Medication Sig Dispense Refill  . acetaminophen (TYLENOL) 500 MG tablet Take 1,000 mg by mouth every 6 (six) hours as needed for mild pain or moderate pain.      Marland Kitchen dexamethasone (DECADRON) 4 MG tablet Take 2 tablets by mouth once a day on the day after chemotherapy and then take 2 tablets two times a day for 2 days. Take with food.  30 tablet  1  . lidocaine-prilocaine (EMLA) cream Apply 1 application topically once. Apply to port 1-2 hours prior to access.  30 g  1  . loteprednol (LOTEMAX) 0.5 % ophthalmic suspension Place 1 drop into both eyes 3 (three) times daily.      . ondansetron (ZOFRAN) 8 MG tablet Take 1 tablet (8 mg total) by mouth 2 (two) times daily as needed. Start on the third day after chemotherapy.  30 tablet  0  . potassium chloride (K-DUR) 10 MEQ tablet Take 1 tablet (10 mEq total) by mouth 2 (two) times daily.  60 tablet  4  . prochlorperazine (COMPAZINE) 10 MG tablet Take 1 tablet (10 mg total) by mouth every 6 (six) hours as needed for nausea or vomiting.  30 tablet  0  .  tobramycin-dexamethasone (TOBRADEX) ophthalmic solution Place 1 drop into both eyes every 4 (four) hours while awake.  5 mL  0   No current facility-administered medications for this visit.   Facility-Administered Medications Ordered in Other Visits  Medication Dose Route Frequency Provider Last Rate Last Dose  . sodium chloride 0.9 % injection 10 mL  10 mL Intravenous PRN Chauncey Cruel, MD   10 mL at 07/10/14 0848    OBJECTIVE: Middle-aged [place] American woman who appears stated age 63 Vitals:   07/10/14 0902  BP: 122/98  Pulse: 102  Temp: 98.2 F (36.8 C)  Resp: 19     Body mass index is 44.71 kg/(m^2).    ECOG FS:1 - Symptomatic but completely ambulatory    Skin: warm, dry, bilateral palms and nail beds hyperpigmented HEENT: sclerae anicteric, conjunctivae pink, oropharynx clear. No thrush or mucositis.  Lymph Nodes: No cervical or supraclavicular lymphadenopathy  Lungs: clear to auscultation bilaterally, no rales, wheezes, or rhonci  Heart: regular rate  and rhythm  Abdomen: round, soft, non tender, positive bowel sounds  Musculoskeletal: No focal spinal tenderness, no peripheral edema  Neuro: non focal, well oriented, positive affect  Breasts:deferred, bilateral axillae benign  LAB RESULTS:  I No results found for this basename: SPEP,  UPEP,   kappa and lambda light chains    Lab Results  Component Value Date   WBC 2.4* 07/10/2014   NEUTROABS 1.3* 07/10/2014   HGB 9.7* 07/10/2014   HCT 29.7* 07/10/2014   MCV 97.8 07/10/2014   PLT 251 07/10/2014      Chemistry      Component Value Date/Time   NA 143 07/10/2014 0824   NA 143 04/24/2014 0958   K 3.6 07/10/2014 0824   K 3.5 04/24/2014 0958   CL 105 04/24/2014 0958   CO2 23 07/10/2014 0824   CO2 25 04/24/2014 0958   BUN 6.5* 07/10/2014 0824   BUN 12 04/24/2014 0958   CREATININE 0.8 07/10/2014 0824   CREATININE 0.93 04/24/2014 0958      Component Value Date/Time   CALCIUM 9.5 07/10/2014 0824   CALCIUM  9.3 04/24/2014 0958   ALKPHOS 105 07/10/2014 0824   ALKPHOS 121* 04/24/2014 0958   AST 21 07/10/2014 0824   AST 13 04/24/2014 0958   ALT 32 07/10/2014 0824   ALT 25 04/24/2014 0958   BILITOT 1.33* 07/10/2014 0824   BILITOT 0.3 04/24/2014 0958       No results found for this basename: LABCA2    No components found with this basename: [id number]    No results found for this basename: INR,  in the last 168 hours  Urinalysis    Component Value Date/Time   COLORURINE YELLOW 04/05/2008 1120   APPEARANCEUR CLEAR 04/05/2008 1120   LABSPEC 1.021 04/05/2008 1120   PHURINE 6.0 04/05/2008 1120   GLUCOSEU NEGATIVE 04/05/2008 1120   HGBUR NEGATIVE 04/05/2008 1120   BILIRUBINUR NEGATIVE 04/05/2008 1120   KETONESUR NEGATIVE 04/05/2008 1120   PROTEINUR NEGATIVE 04/05/2008 1120   UROBILINOGEN 0.2 04/05/2008 1120   NITRITE NEGATIVE 04/05/2008 1120   LEUKOCYTESUR NEGATIVE MICROSCOPIC NOT DONE ON URINES WITH NEGATIVE PROTEIN, BLOOD, LEUKOCYTES, NITRITE, OR GLUCOSE <1000 mg/dL. 04/05/2008 1120    STUDIES: No results found.  ASSESSMENT: 63 y.o. [place] woman s/p breast and left axillary lymph node biopsy 02/22/2014, both positive for a clinical T2 N2, stage IIIA invasive ductal carcinoma, grade 3, triple negative, with an MIB-1 of 90%  (1) neoadjuvant chemotherapy started 03/13/2014, with cyclophosphamide and doxorubicin in dose dense fashion x4, with Neulasta support on day 2, completed 04/24/2014, to be followed by weekly carboplatin and paclitaxel x12, paclitaxel reduced during cycle 5 and cycle 7 because of elevated bilirubin levels  (2) definitive surgery to follow chemotherapy  (3) radiation to follow surgery  (4) remote history of partial colectomy for early stage colon Lopez incidentally found during TAH/BSO in the 1980s  (5) genetics testing since 04/03/2014, 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 ANC is 1.3 today. Counts reviewed with Dr. Jana Hakim. Will proceed with chemo today withpaclitaxel dose to be 70% of original. She will receive Neupogen 480 mcg daily for 3 days beginning tomorrow. Her treatment related anemia continues, but beside fatigue, she his asymptomatic. We will continue to monitor this value.   Dericka will  return next week for labs, an office visit, and cycle 8 next week. Ave understands and agrees with this plan. She knows the goal of treatment in her case is cure. She has been encouraged to call with any issues that might arise before her next visit here.    Mikey Bussing, NP   07/10/2014 9:30 AM

## 2014-07-10 NOTE — Patient Instructions (Signed)

## 2014-07-10 NOTE — Progress Notes (Signed)
Ok to treat with today's ANC 1.3 per Altamese Dilling, NP

## 2014-07-11 ENCOUNTER — Ambulatory Visit: Payer: Medicare Other

## 2014-07-11 ENCOUNTER — Ambulatory Visit (HOSPITAL_BASED_OUTPATIENT_CLINIC_OR_DEPARTMENT_OTHER): Payer: Medicare Other

## 2014-07-11 DIAGNOSIS — Z5189 Encounter for other specified aftercare: Secondary | ICD-10-CM

## 2014-07-11 DIAGNOSIS — D709 Neutropenia, unspecified: Secondary | ICD-10-CM

## 2014-07-11 DIAGNOSIS — C50812 Malignant neoplasm of overlapping sites of left female breast: Secondary | ICD-10-CM | POA: Diagnosis not present

## 2014-07-11 MED ORDER — TBO-FILGRASTIM 480 MCG/0.8ML ~~LOC~~ SOSY
480.0000 ug | PREFILLED_SYRINGE | Freq: Once | SUBCUTANEOUS | Status: AC
  Administered 2014-07-11: 480 ug via SUBCUTANEOUS
  Filled 2014-07-11: qty 0.8

## 2014-07-11 NOTE — Patient Instructions (Signed)

## 2014-07-12 ENCOUNTER — Ambulatory Visit (HOSPITAL_BASED_OUTPATIENT_CLINIC_OR_DEPARTMENT_OTHER): Payer: Medicare Other

## 2014-07-12 VITALS — BP 126/87 | HR 95 | Temp 98.3°F | Resp 20

## 2014-07-12 DIAGNOSIS — Z5189 Encounter for other specified aftercare: Secondary | ICD-10-CM | POA: Diagnosis not present

## 2014-07-12 DIAGNOSIS — C50812 Malignant neoplasm of overlapping sites of left female breast: Secondary | ICD-10-CM

## 2014-07-12 DIAGNOSIS — C773 Secondary and unspecified malignant neoplasm of axilla and upper limb lymph nodes: Secondary | ICD-10-CM | POA: Diagnosis not present

## 2014-07-12 DIAGNOSIS — D709 Neutropenia, unspecified: Secondary | ICD-10-CM

## 2014-07-12 MED ORDER — TBO-FILGRASTIM 480 MCG/0.8ML ~~LOC~~ SOSY
480.0000 ug | PREFILLED_SYRINGE | Freq: Once | SUBCUTANEOUS | Status: AC
  Administered 2014-07-12: 480 ug via SUBCUTANEOUS
  Filled 2014-07-12: qty 0.8

## 2014-07-12 NOTE — Patient Instructions (Signed)

## 2014-07-13 ENCOUNTER — Ambulatory Visit (HOSPITAL_BASED_OUTPATIENT_CLINIC_OR_DEPARTMENT_OTHER): Payer: Medicare Other

## 2014-07-13 VITALS — BP 115/100 | HR 102 | Temp 98.2°F | Resp 17

## 2014-07-13 DIAGNOSIS — Z5189 Encounter for other specified aftercare: Secondary | ICD-10-CM | POA: Diagnosis not present

## 2014-07-13 DIAGNOSIS — C773 Secondary and unspecified malignant neoplasm of axilla and upper limb lymph nodes: Secondary | ICD-10-CM

## 2014-07-13 DIAGNOSIS — D709 Neutropenia, unspecified: Secondary | ICD-10-CM

## 2014-07-13 DIAGNOSIS — C50812 Malignant neoplasm of overlapping sites of left female breast: Secondary | ICD-10-CM | POA: Diagnosis not present

## 2014-07-13 MED ORDER — TBO-FILGRASTIM 480 MCG/0.8ML ~~LOC~~ SOSY
480.0000 ug | PREFILLED_SYRINGE | Freq: Once | SUBCUTANEOUS | Status: AC
  Administered 2014-07-13: 480 ug via SUBCUTANEOUS
  Filled 2014-07-13: qty 0.8

## 2014-07-13 NOTE — Patient Instructions (Signed)

## 2014-07-17 ENCOUNTER — Other Ambulatory Visit (HOSPITAL_BASED_OUTPATIENT_CLINIC_OR_DEPARTMENT_OTHER): Payer: Medicare Other

## 2014-07-17 ENCOUNTER — Ambulatory Visit (HOSPITAL_BASED_OUTPATIENT_CLINIC_OR_DEPARTMENT_OTHER): Payer: Medicare Other

## 2014-07-17 ENCOUNTER — Encounter: Payer: Self-pay | Admitting: Nurse Practitioner

## 2014-07-17 ENCOUNTER — Ambulatory Visit: Payer: Medicare Other

## 2014-07-17 ENCOUNTER — Other Ambulatory Visit: Payer: Self-pay | Admitting: Oncology

## 2014-07-17 ENCOUNTER — Ambulatory Visit (HOSPITAL_BASED_OUTPATIENT_CLINIC_OR_DEPARTMENT_OTHER): Payer: Medicare Other | Admitting: Nurse Practitioner

## 2014-07-17 VITALS — BP 141/81 | HR 18 | Temp 98.6°F | Resp 18 | Ht 61.0 in | Wt 232.9 lb

## 2014-07-17 DIAGNOSIS — C50412 Malignant neoplasm of upper-outer quadrant of left female breast: Secondary | ICD-10-CM

## 2014-07-17 DIAGNOSIS — C50812 Malignant neoplasm of overlapping sites of left female breast: Secondary | ICD-10-CM

## 2014-07-17 DIAGNOSIS — E876 Hypokalemia: Secondary | ICD-10-CM | POA: Diagnosis not present

## 2014-07-17 DIAGNOSIS — C773 Secondary and unspecified malignant neoplasm of axilla and upper limb lymph nodes: Secondary | ICD-10-CM | POA: Diagnosis not present

## 2014-07-17 DIAGNOSIS — C171 Malignant neoplasm of jejunum: Secondary | ICD-10-CM | POA: Diagnosis not present

## 2014-07-17 DIAGNOSIS — Z95828 Presence of other vascular implants and grafts: Secondary | ICD-10-CM

## 2014-07-17 DIAGNOSIS — Z5111 Encounter for antineoplastic chemotherapy: Secondary | ICD-10-CM | POA: Diagnosis not present

## 2014-07-17 LAB — COMPREHENSIVE METABOLIC PANEL (CC13)
ALK PHOS: 119 U/L (ref 40–150)
ALT: 29 U/L (ref 0–55)
AST: 17 U/L (ref 5–34)
Albumin: 3.5 g/dL (ref 3.5–5.0)
Anion Gap: 11 mEq/L (ref 3–11)
BUN: 5 mg/dL — AB (ref 7.0–26.0)
CALCIUM: 9.9 mg/dL (ref 8.4–10.4)
CO2: 23 mEq/L (ref 22–29)
CREATININE: 0.9 mg/dL (ref 0.6–1.1)
Chloride: 108 mEq/L (ref 98–109)
Glucose: 130 mg/dl (ref 70–140)
POTASSIUM: 3.3 meq/L — AB (ref 3.5–5.1)
Sodium: 142 mEq/L (ref 136–145)
Total Bilirubin: 0.94 mg/dL (ref 0.20–1.20)
Total Protein: 7 g/dL (ref 6.4–8.3)

## 2014-07-17 LAB — CBC WITH DIFFERENTIAL/PLATELET
BASO%: 0.9 % (ref 0.0–2.0)
BASOS ABS: 0 10*3/uL (ref 0.0–0.1)
EOS%: 0.8 % (ref 0.0–7.0)
Eosinophils Absolute: 0 10*3/uL (ref 0.0–0.5)
HCT: 32.3 % — ABNORMAL LOW (ref 34.8–46.6)
HGB: 10.5 g/dL — ABNORMAL LOW (ref 11.6–15.9)
LYMPH%: 26.7 % (ref 14.0–49.7)
MCH: 31.3 pg (ref 25.1–34.0)
MCHC: 32.4 g/dL (ref 31.5–36.0)
MCV: 96.6 fL (ref 79.5–101.0)
MONO#: 0.7 10*3/uL (ref 0.1–0.9)
MONO%: 15.4 % — AB (ref 0.0–14.0)
NEUT#: 2.5 10*3/uL (ref 1.5–6.5)
NEUT%: 56.2 % (ref 38.4–76.8)
PLATELETS: 228 10*3/uL (ref 145–400)
RBC: 3.35 10*6/uL — AB (ref 3.70–5.45)
RDW: 15.3 % — ABNORMAL HIGH (ref 11.2–14.5)
WBC: 4.4 10*3/uL (ref 3.9–10.3)
lymph#: 1.2 10*3/uL (ref 0.9–3.3)

## 2014-07-17 MED ORDER — DEXAMETHASONE SODIUM PHOSPHATE 20 MG/5ML IJ SOLN
INTRAMUSCULAR | Status: AC
  Filled 2014-07-17: qty 5

## 2014-07-17 MED ORDER — DIPHENHYDRAMINE HCL 50 MG/ML IJ SOLN
25.0000 mg | Freq: Once | INTRAMUSCULAR | Status: AC
  Administered 2014-07-17: 25 mg via INTRAVENOUS

## 2014-07-17 MED ORDER — ONDANSETRON 16 MG/50ML IVPB (CHCC)
16.0000 mg | Freq: Once | INTRAVENOUS | Status: AC
  Administered 2014-07-17: 16 mg via INTRAVENOUS

## 2014-07-17 MED ORDER — HEPARIN SOD (PORK) LOCK FLUSH 100 UNIT/ML IV SOLN
500.0000 [IU] | Freq: Once | INTRAVENOUS | Status: AC | PRN
  Administered 2014-07-17: 500 [IU]
  Filled 2014-07-17: qty 5

## 2014-07-17 MED ORDER — SODIUM CHLORIDE 0.9 % IJ SOLN
10.0000 mL | INTRAMUSCULAR | Status: DC | PRN
  Administered 2014-07-17: 10 mL
  Filled 2014-07-17: qty 10

## 2014-07-17 MED ORDER — DIPHENHYDRAMINE HCL 50 MG/ML IJ SOLN
INTRAMUSCULAR | Status: AC
  Filled 2014-07-17: qty 1

## 2014-07-17 MED ORDER — SODIUM CHLORIDE 0.9 % IJ SOLN
10.0000 mL | INTRAMUSCULAR | Status: DC | PRN
  Administered 2014-07-17: 10 mL via INTRAVENOUS
  Filled 2014-07-17: qty 10

## 2014-07-17 MED ORDER — FAMOTIDINE IN NACL 20-0.9 MG/50ML-% IV SOLN
20.0000 mg | Freq: Once | INTRAVENOUS | Status: AC
  Administered 2014-07-17: 20 mg via INTRAVENOUS

## 2014-07-17 MED ORDER — SODIUM CHLORIDE 0.9 % IV SOLN
Freq: Once | INTRAVENOUS | Status: AC
  Administered 2014-07-17: 12:00:00 via INTRAVENOUS

## 2014-07-17 MED ORDER — FAMOTIDINE IN NACL 20-0.9 MG/50ML-% IV SOLN
INTRAVENOUS | Status: AC
  Filled 2014-07-17: qty 50

## 2014-07-17 MED ORDER — PACLITAXEL CHEMO INJECTION 300 MG/50ML
56.0000 mg/m2 | Freq: Once | INTRAVENOUS | Status: AC
  Administered 2014-07-17: 120 mg via INTRAVENOUS
  Filled 2014-07-17: qty 20

## 2014-07-17 MED ORDER — ONDANSETRON 16 MG/50ML IVPB (CHCC)
INTRAVENOUS | Status: AC
  Filled 2014-07-17: qty 16

## 2014-07-17 MED ORDER — DEXAMETHASONE SODIUM PHOSPHATE 20 MG/5ML IJ SOLN
20.0000 mg | Freq: Once | INTRAMUSCULAR | Status: AC
  Administered 2014-07-17: 20 mg via INTRAVENOUS

## 2014-07-17 MED ORDER — SODIUM CHLORIDE 0.9 % IV SOLN
214.2400 mg | Freq: Once | INTRAVENOUS | Status: AC
  Administered 2014-07-17: 210 mg via INTRAVENOUS
  Filled 2014-07-17: qty 21

## 2014-07-17 NOTE — Patient Instructions (Signed)

## 2014-07-17 NOTE — Progress Notes (Signed)
[facility]  Telephone:(336) [phone] Fax:(336) [phone]     ID: LEILANY DIGERONIMO DOB: June 04, 1962  MR#: [id number]  LJQ#:[id number]  PCP: Maximino Greenland, MD GYN: SU: Rolm Bookbinder OTHER MD: Gery Pray  CHIEF COMPLAINT: Stage III breast cancer, triple negative   CURRENT TREATMENT: Neoadjuvant chemotherapy  BREAST CANCER HISTORY: From the original intake note:  Kaiyla herself palpated a mass in her left breast, and immediately brought it to the attention of her primary care physician, Dr. Baird Cancer, who confirmed the finding and setup the patient for bilateral diagnostic mammography at the breast Center 02/20/2014. This showed a high density mass in the outer left breast measuring up to 3.7 cm. It corresponded to the site of palpable concern. In addition the normal 4 logically abnormal lymph nodes in the left axilla. The mass was palpable by exam, and by ultrasonography measured 2.6 cm. There were enlarged and morphologically abnormal lymph nodes in the left axilla, largest measuring 3 cm.  Biopsy of the left breast mass in question and 1 of the abnormal axillary lymph nodes 02/22/2014 showed ([id number]) biopsies to be positive for an invasive ductal carcinoma, grade 3, triple negative, with an MIB-1 of 90%. Specifically the HER-2 signals ratio was 1.05, and the number per cell 2.00.  MRI of the breasts 03/01/2014 showed a 3.8 cm enhancing mass with areas of apparent necrosis in the left breast, as well as the biopsy tract extending laterally, the combined measure 5.3 cm. There were no other masses or areas of abnormal enhancement. There were multiple abnormally enlarged left axillary lymph nodes with loss of the normal fatty hilum. These included level I and retropectoral lymph nodes.  The patient's subsequent history is as detailed below   INTERVAL HISTORY: Arleny returns today for follow up of her breast cancer. Today is day 1, cycle 8 of 12 planned weekly cycles of  paclitaxel and carboplatin. She generally tolerates treatment well with few complaints. Lately cycles have been held or doses reduced due to elevated bilirubin levels or low ANC. This past week she had 3 daily injections of neupogen and her paclitaxel is down to 70% of the original dose.   REVIEW OF SYSTEMS: Vinie denies fevers, chills, nausea, or vomiting. This week she had some loose stools, but nothing she would categorize as diarrhea. She has no mouth sores or decrease in appetite, though her taste sensation is changed after treatment. She has no shortness of breath, chest pain, cough, or palpitations. She has some mild fatigue, but she is able to complete her chores with occasional complaints. A detailed review of systems is otherwise noncontributory.   PAST MEDICAL HISTORY: Past Medical History  Diagnosis Date  . Colon cancer   . Hx of rotator cuff surgery     PAST SURGICAL HISTORY: Past Surgical History  Procedure Laterality Date  . Abdominal hysterectomy    . Colon surgery      colectomy/colostomy with takedown    FAMILY HISTORY Family History  Problem Relation Age of Onset  . Cancer Mother 41    breast  . Cancer Maternal Aunt     breast cancer at unknown age   the patient's father died at the age of 4 following a stroke in the setting of diabetes; the patient's mother is living at 70. The patient has 3 brothers and 2 sisters. [name], [name], was diagnosed with breast cancer in her early 29s. There is no other breast or ovarian cancer in the family. The  patient herself was diagnosed with watts by history he is an incidentally found stage I colon carcinoma noted at the time of her hysterectomy. She had a partial colectomy but no adjuvant treatment. We have no records regarding that procedure  GYNECOLOGIC HISTORY:  No LMP recorded. Patient has had a hysterectomy. Menarche age 81, first live birth age 43, the patient underwent total abdominal hysterectomy with  bilateral salpingo-oophorectomy in her 56s. She did not take hormone replacement. She did not take oral contraceptives at any point.  SOCIAL HISTORY:  Roshanna work for [employer] for about 40 years, retiring in 2008. She is divorced and lives alone, with no pets. Her daughter Gustavia Carie works for [employer] in [employer]. The second daughter, Glynda Soliday, works as a Research scientist (physical sciences) for [employer]. The patient has 6 grandchildren and one great-grandchild. She attends a [employer].    ADVANCED DIRECTIVES: Not in place. The patient intends to name her daughter [name]. her healthcare power of attorney. [name] is work number is [id number]. On the patient's 03/01/2014 visit she was given a copy of the appropriate documents to complete and notarize at her discretion   HEALTH MAINTENANCE: History  Substance Use Topics  . Smoking status: Never Smoker   . Smokeless tobacco: Not on file  . Alcohol Use: No     Colonoscopy: 2000  PAP: May 2015  Bone density: 05/23/2004, at [facility] hospital; normal  Lipid panel:  No Known Allergies  Current Outpatient Prescriptions  Medication Sig Dispense Refill  . dexamethasone (DECADRON) 4 MG tablet Take 2 tablets by mouth once a day on the day after chemotherapy and then take 2 tablets two times a day for 2 days. Take with food. 30 tablet 1  . lidocaine-prilocaine (EMLA) cream Apply 1 application topically once. Apply to port 1-2 hours prior to access. 30 g 1  . loteprednol (LOTEMAX) 0.5 % ophthalmic suspension Place 1 drop into both eyes 3 (three) times daily.    . ondansetron (ZOFRAN) 8 MG tablet Take 1 tablet (8 mg total) by mouth 2 (two) times daily as needed. Start on the third day after chemotherapy. 30 tablet 0  . potassium chloride (K-DUR) 10 MEQ tablet Take 1 tablet (10 mEq total) by mouth 2 (two) times daily. 60 tablet 4  . tobramycin-dexamethasone (TOBRADEX) ophthalmic solution Place 1 drop into both eyes every 4 (four) hours while awake. 5 mL 0   . acetaminophen (TYLENOL) 500 MG tablet Take 1,000 mg by mouth every 6 (six) hours as needed for mild pain or moderate pain.    Marland Kitchen prochlorperazine (COMPAZINE) 10 MG tablet Take 1 tablet (10 mg total) by mouth every 6 (six) hours as needed for nausea or vomiting. 30 tablet 0   No current facility-administered medications for this visit.    OBJECTIVE: Middle-aged [place] American woman who appears stated age 15 Vitals:   07/17/14 1111  BP: 141/81  Pulse: 18  Temp: 98.6 F (37 C)  Resp: 18     Body mass index is 44.03 kg/(m^2).    ECOG FS:1 - Symptomatic but completely ambulatory  Skin: bilateral palms, nailbeds hyperpigmented  Sclerae unicteric, pupils equal and reactive Oropharynx clear and moist-- no thrush No cervical or supraclavicular adenopathy Lungs no rales or rhonchi Heart regular rate and rhythm Abd soft, nontender, positive bowel sounds MSK no focal spinal tenderness, no upper extremity lymphedema Neuro: nonfocal, well oriented, appropriate affect Breasts: deferred  LAB RESULTS:  I No results found for: SPEP  Lab Results  Component Value Date   WBC 4.4 07/17/2014   NEUTROABS 2.5 07/17/2014   HGB 10.5* 07/17/2014   HCT 32.3* 07/17/2014   MCV 96.6 07/17/2014   PLT 228 07/17/2014      Chemistry      Component Value Date/Time   NA 142 07/17/2014 1043   NA 143 04/24/2014 0958   K 3.3* 07/17/2014 1043   K 3.5 04/24/2014 0958   CL 105 04/24/2014 0958   CO2 23 07/17/2014 1043   CO2 25 04/24/2014 0958   BUN 5.0* 07/17/2014 1043   BUN 12 04/24/2014 0958   CREATININE 0.9 07/17/2014 1043   CREATININE 0.93 04/24/2014 0958      Component Value Date/Time   CALCIUM 9.9 07/17/2014 1043   CALCIUM 9.3 04/24/2014 0958   ALKPHOS 119 07/17/2014 1043   ALKPHOS 121* 04/24/2014 0958   AST 17 07/17/2014 1043   AST 13 04/24/2014 0958   ALT 29 07/17/2014 1043   ALT 25 04/24/2014 0958   BILITOT 0.94 07/17/2014 1043   BILITOT 0.3 04/24/2014 0958       No  results found for: LABCA2  No components found for: [id number]  No results for input(s): INR in the last 168 hours.  Urinalysis    Component Value Date/Time   COLORURINE YELLOW 04/05/2008 1120   APPEARANCEUR CLEAR 04/05/2008 1120   LABSPEC 1.021 04/05/2008 1120   PHURINE 6.0 04/05/2008 1120   GLUCOSEU NEGATIVE 04/05/2008 1120   HGBUR NEGATIVE 04/05/2008 1120   BILIRUBINUR NEGATIVE 04/05/2008 1120   KETONESUR NEGATIVE 04/05/2008 1120   PROTEINUR NEGATIVE 04/05/2008 1120   UROBILINOGEN 0.2 04/05/2008 1120   NITRITE NEGATIVE 04/05/2008 1120   LEUKOCYTESUR  04/05/2008 1120    NEGATIVE MICROSCOPIC NOT DONE ON URINES WITH NEGATIVE PROTEIN, BLOOD, LEUKOCYTES, NITRITE, OR GLUCOSE <1000 mg/dL.    STUDIES: No results found.  ASSESSMENT: 63 y.o. [place] woman s/p breast and left axillary lymph node biopsy 02/22/2014, both positive for a clinical T2 N2, stage IIIA invasive ductal carcinoma, grade 3, triple negative, with an MIB-1 of 90%  (1) neoadjuvant chemotherapy started 03/13/2014, with cyclophosphamide and doxorubicin in dose dense fashion x4, with Neulasta support on day 2, completed 04/24/2014, to be followed by weekly carboplatin and paclitaxel x12, paclitaxel reduced during cycle 5 and cycle 7 because of elevated bilirubin levels  (2) definitive surgery to follow chemotherapy  (3) radiation to follow surgery  (4) remote history of partial colectomy for early stage colon cancer incidentally found during TAH/BSO in the 1980s  (5) genetics testing since 04/03/2014, 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 The labs were reviewed in detail and both her ANC and total bili returned to normal values. We will continue to watch these numbers going forward. We  will proceed with day 1, cycle 8 of paclitaxel and carboplatin today. Her potassium is down to 3.3, and I have asked her to add 53mq to lunch daily, effectively increasing her potassium supplements to 326m total daily .  LiEpifaniaill return next week for labs, an office visit, and the start of cycle 9. She understands and agrees with this plan. She knows the goal of treatment in her case is cure. She has been encouraged to call with any issues that might arise before her next visit here.  Marcelino Duster, NP   07/17/2014 11:42 AM`

## 2014-07-17 NOTE — Patient Instructions (Signed)
[facility] Discharge Instructions for Patients Receiving Chemotherapy  Today you received the following chemotherapy agents: Taxol and Carboplatin.  To help prevent nausea and vomiting after your treatment, we encourage you to take your nausea medication as prescribed.   If you develop nausea and vomiting that is not controlled by your nausea medication, call the clinic.   BELOW ARE SYMPTOMS THAT SHOULD BE REPORTED IMMEDIATELY:  *FEVER GREATER THAN 100.5 F  *CHILLS WITH OR WITHOUT FEVER  NAUSEA AND VOMITING THAT IS NOT CONTROLLED WITH YOUR NAUSEA MEDICATION  *UNUSUAL SHORTNESS OF BREATH  *UNUSUAL BRUISING OR BLEEDING  TENDERNESS IN MOUTH AND THROAT WITH OR WITHOUT PRESENCE OF ULCERS  *URINARY PROBLEMS  *BOWEL PROBLEMS  UNUSUAL RASH Items with * indicate a potential emergency and should be followed up as soon as possible.  Feel free to call the clinic you have any questions or concerns. The clinic phone number is [phone].    

## 2014-07-18 ENCOUNTER — Ambulatory Visit: Payer: Medicare Other

## 2014-07-24 ENCOUNTER — Ambulatory Visit (HOSPITAL_BASED_OUTPATIENT_CLINIC_OR_DEPARTMENT_OTHER): Payer: Medicare Other

## 2014-07-24 ENCOUNTER — Other Ambulatory Visit (HOSPITAL_BASED_OUTPATIENT_CLINIC_OR_DEPARTMENT_OTHER): Payer: Medicare Other

## 2014-07-24 ENCOUNTER — Ambulatory Visit (HOSPITAL_BASED_OUTPATIENT_CLINIC_OR_DEPARTMENT_OTHER): Payer: Medicare Other | Admitting: Nurse Practitioner

## 2014-07-24 ENCOUNTER — Ambulatory Visit: Payer: Medicare Other

## 2014-07-24 ENCOUNTER — Encounter: Payer: Self-pay | Admitting: Nurse Practitioner

## 2014-07-24 VITALS — BP 135/81 | HR 113 | Temp 98.3°F | Resp 18 | Ht 61.0 in | Wt 232.4 lb

## 2014-07-24 DIAGNOSIS — C50412 Malignant neoplasm of upper-outer quadrant of left female breast: Secondary | ICD-10-CM

## 2014-07-24 DIAGNOSIS — C50812 Malignant neoplasm of overlapping sites of left female breast: Secondary | ICD-10-CM | POA: Diagnosis not present

## 2014-07-24 DIAGNOSIS — T451X5A Adverse effect of antineoplastic and immunosuppressive drugs, initial encounter: Secondary | ICD-10-CM

## 2014-07-24 DIAGNOSIS — Z171 Estrogen receptor negative status [ER-]: Secondary | ICD-10-CM

## 2014-07-24 DIAGNOSIS — C773 Secondary and unspecified malignant neoplasm of axilla and upper limb lymph nodes: Secondary | ICD-10-CM

## 2014-07-24 DIAGNOSIS — Z5111 Encounter for antineoplastic chemotherapy: Secondary | ICD-10-CM

## 2014-07-24 DIAGNOSIS — D641 Secondary sideroblastic anemia due to disease: Secondary | ICD-10-CM | POA: Diagnosis not present

## 2014-07-24 DIAGNOSIS — Z95828 Presence of other vascular implants and grafts: Secondary | ICD-10-CM

## 2014-07-24 DIAGNOSIS — D61810 Antineoplastic chemotherapy induced pancytopenia: Secondary | ICD-10-CM

## 2014-07-24 LAB — CBC WITH DIFFERENTIAL/PLATELET
BASO%: 1.2 % (ref 0.0–2.0)
Basophils Absolute: 0 10*3/uL (ref 0.0–0.1)
EOS%: 1.7 % (ref 0.0–7.0)
Eosinophils Absolute: 0 10*3/uL (ref 0.0–0.5)
HEMATOCRIT: 30.7 % — AB (ref 34.8–46.6)
HGB: 10.3 g/dL — ABNORMAL LOW (ref 11.6–15.9)
LYMPH%: 31.8 % (ref 14.0–49.7)
MCH: 31.4 pg (ref 25.1–34.0)
MCHC: 33.6 g/dL (ref 31.5–36.0)
MCV: 93.6 fL (ref 79.5–101.0)
MONO#: 0.2 10*3/uL (ref 0.1–0.9)
MONO%: 9.1 % (ref 0.0–14.0)
NEUT#: 1.4 10*3/uL — ABNORMAL LOW (ref 1.5–6.5)
NEUT%: 56.2 % (ref 38.4–76.8)
Platelets: 232 10*3/uL (ref 145–400)
RBC: 3.28 10*6/uL — ABNORMAL LOW (ref 3.70–5.45)
RDW: 14 % (ref 11.2–14.5)
WBC: 2.4 10*3/uL — AB (ref 3.9–10.3)
lymph#: 0.8 10*3/uL — ABNORMAL LOW (ref 0.9–3.3)

## 2014-07-24 LAB — COMPREHENSIVE METABOLIC PANEL (CC13)
ALT: 25 U/L (ref 0–55)
AST: 16 U/L (ref 5–34)
Albumin: 3.5 g/dL (ref 3.5–5.0)
Alkaline Phosphatase: 112 U/L (ref 40–150)
Anion Gap: 10 mEq/L (ref 3–11)
BILIRUBIN TOTAL: 0.8 mg/dL (ref 0.20–1.20)
BUN: 7.8 mg/dL (ref 7.0–26.0)
CO2: 22 meq/L (ref 22–29)
Calcium: 10.1 mg/dL (ref 8.4–10.4)
Chloride: 109 mEq/L (ref 98–109)
Creatinine: 1 mg/dL (ref 0.6–1.1)
Glucose: 116 mg/dl (ref 70–140)
Potassium: 3.9 mEq/L (ref 3.5–5.1)
Sodium: 141 mEq/L (ref 136–145)
Total Protein: 6.9 g/dL (ref 6.4–8.3)

## 2014-07-24 MED ORDER — DEXAMETHASONE SODIUM PHOSPHATE 20 MG/5ML IJ SOLN
INTRAMUSCULAR | Status: AC
  Filled 2014-07-24: qty 5

## 2014-07-24 MED ORDER — DIPHENHYDRAMINE HCL 50 MG/ML IJ SOLN
25.0000 mg | Freq: Once | INTRAMUSCULAR | Status: AC
  Administered 2014-07-24: 25 mg via INTRAVENOUS

## 2014-07-24 MED ORDER — PACLITAXEL CHEMO INJECTION 300 MG/50ML
56.0000 mg/m2 | Freq: Once | INTRAVENOUS | Status: AC
  Administered 2014-07-24: 120 mg via INTRAVENOUS
  Filled 2014-07-24: qty 20

## 2014-07-24 MED ORDER — SODIUM CHLORIDE 0.9 % IV SOLN
214.2400 mg | Freq: Once | INTRAVENOUS | Status: AC
  Administered 2014-07-24: 210 mg via INTRAVENOUS
  Filled 2014-07-24: qty 21

## 2014-07-24 MED ORDER — HEPARIN SOD (PORK) LOCK FLUSH 100 UNIT/ML IV SOLN
500.0000 [IU] | Freq: Once | INTRAVENOUS | Status: AC | PRN
  Administered 2014-07-24: 500 [IU]
  Filled 2014-07-24: qty 5

## 2014-07-24 MED ORDER — SODIUM CHLORIDE 0.9 % IJ SOLN
10.0000 mL | INTRAMUSCULAR | Status: DC | PRN
  Administered 2014-07-24: 10 mL
  Filled 2014-07-24: qty 10

## 2014-07-24 MED ORDER — FAMOTIDINE IN NACL 20-0.9 MG/50ML-% IV SOLN
INTRAVENOUS | Status: AC
  Filled 2014-07-24: qty 50

## 2014-07-24 MED ORDER — DIPHENHYDRAMINE HCL 50 MG/ML IJ SOLN
INTRAMUSCULAR | Status: AC
  Filled 2014-07-24: qty 1

## 2014-07-24 MED ORDER — SODIUM CHLORIDE 0.9 % IJ SOLN
10.0000 mL | INTRAMUSCULAR | Status: DC | PRN
  Administered 2014-07-24: 10 mL via INTRAVENOUS
  Filled 2014-07-24: qty 10

## 2014-07-24 MED ORDER — ONDANSETRON 16 MG/50ML IVPB (CHCC)
INTRAVENOUS | Status: AC
  Filled 2014-07-24: qty 16

## 2014-07-24 MED ORDER — SODIUM CHLORIDE 0.9 % IV SOLN
Freq: Once | INTRAVENOUS | Status: AC
  Administered 2014-07-24: 12:00:00 via INTRAVENOUS

## 2014-07-24 MED ORDER — DEXAMETHASONE SODIUM PHOSPHATE 20 MG/5ML IJ SOLN
20.0000 mg | Freq: Once | INTRAMUSCULAR | Status: AC
  Administered 2014-07-24: 20 mg via INTRAVENOUS

## 2014-07-24 MED ORDER — ONDANSETRON 16 MG/50ML IVPB (CHCC)
16.0000 mg | Freq: Once | INTRAVENOUS | Status: AC
  Administered 2014-07-24: 16 mg via INTRAVENOUS

## 2014-07-24 MED ORDER — FAMOTIDINE IN NACL 20-0.9 MG/50ML-% IV SOLN
20.0000 mg | Freq: Once | INTRAVENOUS | Status: AC
  Administered 2014-07-24: 20 mg via INTRAVENOUS

## 2014-07-24 NOTE — Patient Instructions (Signed)

## 2014-07-24 NOTE — Progress Notes (Signed)
[name]  [phone]) [phone] Fax:(336) [phone]     ID: RAYLEEN WYRICK DOB: March 17, 1951  MR#: [id number]  AQT#:[id number]  PCP: Maximino Greenland, MD GYN: SU: Rolm Bookbinder OTHER MD: Gery Pray  CHIEF COMPLAINT: Stage III breast cancer, triple negative   CURRENT TREATMENT: Neoadjuvant chemotherapy  BREAST CANCER HISTORY: From the original intake note:  Trenise herself palpated a mass in her left breast, and immediately brought it to the attention of her primary care physician, Dr. Baird Cancer, who confirmed the finding and setup the patient for bilateral diagnostic mammography at the breast Center 02/20/2014. This showed a high density mass in the outer left breast measuring up to 3.7 cm. It corresponded to the site of palpable concern. In addition the normal 4 logically abnormal lymph nodes in the left axilla. The mass was palpable by exam, and by ultrasonography measured 2.6 cm. There were enlarged and morphologically abnormal lymph nodes in the left axilla, largest measuring 3 cm.  Biopsy of the left breast mass in question and 1 of the abnormal axillary lymph nodes 02/22/2014 showed ([id number]) biopsies to be positive for an invasive ductal carcinoma, grade 3, triple negative, with an MIB-1 of 90%. Specifically the HER-2 signals ratio was 1.05, and the number per cell 2.00.  MRI of the breasts 03/01/2014 showed a 3.8 cm enhancing mass with areas of apparent necrosis in the left breast, as well as the biopsy tract extending laterally, the combined measure 5.3 cm. There were no other masses or areas of abnormal enhancement. There were multiple abnormally enlarged left axillary lymph nodes with loss of the normal fatty hilum. These included level I and retropectoral lymph nodes.  The patient's subsequent history is as detailed below   INTERVAL HISTORY: Joselynne returns today for follow up of her breast cancer, accompanied by her daughter Otila Kluver. Today is day 1, cycle 9  of 12 planned weekly cycles of paclitaxel and carboplatin. The interval history is generally unremarkable. Julene offers no new complaints, and has tolerated the bulk of her chemo this well.  REVIEW OF SYSTEMS: Allahna denies fevers, chills, nausea, vomiting, or changes in bowel or bladder habits. She has no mouth sores or decrease in appetite, though her taste sensation is changed after treatment. She has no shortness of breath, chest pain, cough, or palpitations. She has some mild fatigue, but she is able to complete her chores with occasional complaints. The "softness" to her fingertips is stable and unchanged from previous weeks. A detailed review of systems is otherwise noncontributory.   PAST MEDICAL HISTORY: Past Medical History  Diagnosis Date  . Colon cancer   . Hx of rotator cuff surgery     PAST SURGICAL HISTORY: Past Surgical History  Procedure Laterality Date  . Abdominal hysterectomy    . Colon surgery      colectomy/colostomy with takedown    FAMILY HISTORY Family History  Problem Relation Age of Onset  . Cancer Mother 82    breast  . Cancer Maternal Aunt     breast cancer at unknown age   the patient's father died at the age of 4 following a stroke in the setting of diabetes; the patient's mother is living at 24. The patient has 3 brothers and 2 sisters. [name], [name], was diagnosed with breast cancer in her early 8s. There is no other breast or ovarian cancer in the family. The patient herself was diagnosed with watts by history he is an incidentally found stage I colon  carcinoma noted at the time of her hysterectomy. She had a partial colectomy but no adjuvant treatment. We have no records regarding that procedure  GYNECOLOGIC HISTORY:  No LMP recorded. Patient has had a hysterectomy. Menarche age 71, first live birth age 76, the patient underwent total abdominal hysterectomy with bilateral salpingo-oophorectomy in her 47s. She did not take hormone  replacement. She did not take oral contraceptives at any point.  SOCIAL HISTORY:  Yanilen work for [employer] for about 40 years, retiring in 2008. She is divorced and lives alone, with no pets. Her daughter Sible Straley works for [employer] in Press [employer]. The second daughter, Madilyne Tadlock, works as a Research scientist (physical sciences) for [employer]. The patient has 6 grandchildren and one great-grandchild. She attends a [employer].    ADVANCED DIRECTIVES: Not in place. The patient intends to name her daughter [name]. her healthcare power of attorney. [name] is work number is [id number]. On the patient's 03/01/2014 visit she was given a copy of the appropriate documents to complete and notarize at her discretion   HEALTH MAINTENANCE: History  Substance Use Topics  . Smoking status: Never Smoker   . Smokeless tobacco: Not on file  . Alcohol Use: No     Colonoscopy: 2000  PAP: May 2015  Bone density: 05/23/2004, at [facility] hospital; normal  Lipid panel:  No Known Allergies  Current Outpatient Prescriptions  Medication Sig Dispense Refill  . acetaminophen (TYLENOL) 500 MG tablet Take 1,000 mg by mouth every 6 (six) hours as needed for mild pain or moderate pain.    Marland Kitchen dexamethasone (DECADRON) 4 MG tablet Take 2 tablets by mouth once a day on the day after chemotherapy and then take 2 tablets two times a day for 2 days. Take with food. 30 tablet 1  . lidocaine-prilocaine (EMLA) cream Apply 1 application topically once. Apply to port 1-2 hours prior to access. 30 g 1  . loteprednol (LOTEMAX) 0.5 % ophthalmic suspension Place 1 drop into both eyes 3 (three) times daily.    . ondansetron (ZOFRAN) 8 MG tablet Take 1 tablet (8 mg total) by mouth 2 (two) times daily as needed. Start on the third day after chemotherapy. 30 tablet 0  . potassium chloride (K-DUR) 10 MEQ tablet Take 1 tablet (10 mEq total) by mouth 2 (two) times daily. 60 tablet 4  . prochlorperazine (COMPAZINE) 10 MG tablet Take 1 tablet (10 mg  total) by mouth every 6 (six) hours as needed for nausea or vomiting. 30 tablet 0  . tobramycin-dexamethasone (TOBRADEX) ophthalmic solution Place 1 drop into both eyes every 4 (four) hours while awake. 5 mL 0   No current facility-administered medications for this visit.   Facility-Administered Medications Ordered in Other Visits  Medication Dose Route Frequency Provider Last Rate Last Dose  . sodium chloride 0.9 % injection 10 mL  10 mL Intravenous PRN Chauncey Cruel, MD   10 mL at 07/24/14 1937    OBJECTIVE: Middle-aged [place] American woman who appears stated age 63 Vitals:   07/24/14 1012  BP: 135/81  Pulse: 113  Temp: 98.3 F (36.8 C)  Resp: 18     Body mass index is 43.93 kg/(m^2).    ECOG FS:1 - Symptomatic but completely ambulatory  Skin: warm, dry, bilateral palms and nail beds hyperpigmented HEENT: sclerae anicteric, conjunctivae pink, oropharynx clear. No thrush or mucositis.  Lymph Nodes: No cervical or supraclavicular lymphadenopathy  Lungs: clear to auscultation bilaterally, no rales, wheezes, or rhonci  Heart: regular rate  and rhythm  Abdomen: round, soft, non tender, positive bowel sounds  Musculoskeletal: No focal spinal tenderness, no peripheral edema  Neuro: non focal, well oriented, positive affect  Breasts: deferred  LAB RESULTS:  I No results found for: SPEP  Lab Results  Component Value Date   WBC 2.4* 07/24/2014   NEUTROABS 1.4* 07/24/2014   HGB 10.3* 07/24/2014   HCT 30.7* 07/24/2014   MCV 93.6 07/24/2014   PLT 232 07/24/2014      Chemistry      Component Value Date/Time   NA 142 07/17/2014 1043   NA 143 04/24/2014 0958   K 3.3* 07/17/2014 1043   K 3.5 04/24/2014 0958   CL 105 04/24/2014 0958   CO2 23 07/17/2014 1043   CO2 25 04/24/2014 0958   BUN 5.0* 07/17/2014 1043   BUN 12 04/24/2014 0958   CREATININE 0.9 07/17/2014 1043   CREATININE 0.93 04/24/2014 0958      Component Value Date/Time   CALCIUM 9.9 07/17/2014 1043    CALCIUM 9.3 04/24/2014 0958   ALKPHOS 119 07/17/2014 1043   ALKPHOS 121* 04/24/2014 0958   AST 17 07/17/2014 1043   AST 13 04/24/2014 0958   ALT 29 07/17/2014 1043   ALT 25 04/24/2014 0958   BILITOT 0.94 07/17/2014 1043   BILITOT 0.3 04/24/2014 0958       No results found for: LABCA2  No components found for: [id number]  No results for input(s): INR in the last 168 hours.  Urinalysis    Component Value Date/Time   COLORURINE YELLOW 04/05/2008 1120   APPEARANCEUR CLEAR 04/05/2008 1120   LABSPEC 1.021 04/05/2008 1120   PHURINE 6.0 04/05/2008 1120   GLUCOSEU NEGATIVE 04/05/2008 1120   HGBUR NEGATIVE 04/05/2008 1120   BILIRUBINUR NEGATIVE 04/05/2008 1120   KETONESUR NEGATIVE 04/05/2008 1120   PROTEINUR NEGATIVE 04/05/2008 1120   UROBILINOGEN 0.2 04/05/2008 1120   NITRITE NEGATIVE 04/05/2008 1120   LEUKOCYTESUR  04/05/2008 1120    NEGATIVE MICROSCOPIC NOT DONE ON URINES WITH NEGATIVE PROTEIN, BLOOD, LEUKOCYTES, NITRITE, OR GLUCOSE <1000 mg/dL.    STUDIES: No results found.  ASSESSMENT: 63 y.o. [place] woman s/p breast and left axillary lymph node biopsy 02/22/2014, both positive for a clinical T2 N2, stage IIIA invasive ductal carcinoma, grade 3, triple negative, with an MIB-1 of 90%  (1) neoadjuvant chemotherapy started 03/13/2014, with cyclophosphamide and doxorubicin in dose dense fashion x4, with Neulasta support on day 2, completed 04/24/2014, to be followed by weekly carboplatin and paclitaxel x12, paclitaxel reduced during cycle 5 and cycle 7 because of elevated bilirubin levels  (2) definitive surgery to follow chemotherapy  (3) radiation to follow surgery  (4) remote history of partial colectomy for early stage colon cancer incidentally found during TAH/BSO in the 1980s  (5) genetics testing since 04/03/2014, 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 The CBC was reviewed in detail and was stable. Her treatment related anemia continues, but she is asymptomatic. Her ANC is at 1.4 this week, but we will simply monitor this value and administer neupogen next week if necessary. The CMET was not yet available to review, and thus I don't have her total bili to comment on. This value has been elevated in the past, but with dose reductions, she has been stable and able  to receive treatments. We will tentatively proceed with day 1, cycle 9 of paclitaxel and carboplatin today.   Ellan will return next week for labs, an office visit, and the start of cycle 10. She understands and agrees with this plan. She knows the goal of treatment in her case is cure. She has been encouraged to call with any issues that might arise before her next visit here.   Marcelino Duster, NP   07/24/2014 10:16 AM`

## 2014-07-24 NOTE — Progress Notes (Signed)
Pt saw Nira Conn, NP today prior to chemo.  OK to treat with all lab results today as per NP.

## 2014-07-24 NOTE — Patient Instructions (Signed)
[facility] Discharge Instructions for Patients Receiving Chemotherapy  Today you received the following chemotherapy agents taxol, carboplatin  To help prevent nausea and vomiting after your treatment, we encourage you to take your nausea medication as needed   If you develop nausea and vomiting that is not controlled by your nausea medication, call the clinic.   BELOW ARE SYMPTOMS THAT SHOULD BE REPORTED IMMEDIATELY:  *FEVER GREATER THAN 100.5 F  *CHILLS WITH OR WITHOUT FEVER  NAUSEA AND VOMITING THAT IS NOT CONTROLLED WITH YOUR NAUSEA MEDICATION  *UNUSUAL SHORTNESS OF BREATH  *UNUSUAL BRUISING OR BLEEDING  TENDERNESS IN MOUTH AND THROAT WITH OR WITHOUT PRESENCE OF ULCERS  *URINARY PROBLEMS  *BOWEL PROBLEMS  UNUSUAL RASH Items with * indicate a potential emergency and should be followed up as soon as possible.  Feel free to call the clinic you have any questions or concerns. The clinic phone number is [phone].    

## 2014-07-25 ENCOUNTER — Ambulatory Visit: Payer: Medicare Other

## 2014-07-31 ENCOUNTER — Other Ambulatory Visit: Payer: Medicare Other

## 2014-07-31 ENCOUNTER — Ambulatory Visit (HOSPITAL_BASED_OUTPATIENT_CLINIC_OR_DEPARTMENT_OTHER): Payer: Medicare Other

## 2014-07-31 ENCOUNTER — Encounter: Payer: Self-pay | Admitting: Nurse Practitioner

## 2014-07-31 ENCOUNTER — Telehealth: Payer: Self-pay | Admitting: Nurse Practitioner

## 2014-07-31 ENCOUNTER — Ambulatory Visit (HOSPITAL_BASED_OUTPATIENT_CLINIC_OR_DEPARTMENT_OTHER): Payer: Medicare Other | Admitting: Nurse Practitioner

## 2014-07-31 ENCOUNTER — Other Ambulatory Visit (HOSPITAL_BASED_OUTPATIENT_CLINIC_OR_DEPARTMENT_OTHER): Payer: Medicare Other

## 2014-07-31 ENCOUNTER — Ambulatory Visit: Payer: Medicare Other

## 2014-07-31 VITALS — BP 127/89 | HR 101 | Temp 98.6°F | Resp 20 | Wt 233.7 lb

## 2014-07-31 DIAGNOSIS — C50812 Malignant neoplasm of overlapping sites of left female breast: Secondary | ICD-10-CM

## 2014-07-31 DIAGNOSIS — C50412 Malignant neoplasm of upper-outer quadrant of left female breast: Secondary | ICD-10-CM

## 2014-07-31 DIAGNOSIS — C773 Secondary and unspecified malignant neoplasm of axilla and upper limb lymph nodes: Secondary | ICD-10-CM | POA: Diagnosis not present

## 2014-07-31 DIAGNOSIS — D701 Agranulocytosis secondary to cancer chemotherapy: Secondary | ICD-10-CM

## 2014-07-31 DIAGNOSIS — Z95828 Presence of other vascular implants and grafts: Secondary | ICD-10-CM

## 2014-07-31 DIAGNOSIS — Z85038 Personal history of other malignant neoplasm of large intestine: Secondary | ICD-10-CM | POA: Diagnosis not present

## 2014-07-31 DIAGNOSIS — Z171 Estrogen receptor negative status [ER-]: Secondary | ICD-10-CM | POA: Diagnosis not present

## 2014-07-31 DIAGNOSIS — Z5111 Encounter for antineoplastic chemotherapy: Secondary | ICD-10-CM | POA: Diagnosis not present

## 2014-07-31 DIAGNOSIS — T451X5A Adverse effect of antineoplastic and immunosuppressive drugs, initial encounter: Secondary | ICD-10-CM

## 2014-07-31 LAB — CBC WITH DIFFERENTIAL/PLATELET
BASO%: 0.9 % (ref 0.0–2.0)
Basophils Absolute: 0 10*3/uL (ref 0.0–0.1)
EOS ABS: 0 10*3/uL (ref 0.0–0.5)
EOS%: 0.4 % (ref 0.0–7.0)
HCT: 29.1 % — ABNORMAL LOW (ref 34.8–46.6)
HGB: 9.6 g/dL — ABNORMAL LOW (ref 11.6–15.9)
LYMPH%: 34.9 % (ref 14.0–49.7)
MCH: 30.6 pg (ref 25.1–34.0)
MCHC: 33 g/dL (ref 31.5–36.0)
MCV: 92.7 fL (ref 79.5–101.0)
MONO#: 0.3 10*3/uL (ref 0.1–0.9)
MONO%: 13.4 % (ref 0.0–14.0)
NEUT%: 50.4 % (ref 38.4–76.8)
NEUTROS ABS: 1.2 10*3/uL — AB (ref 1.5–6.5)
NRBC: 0 % (ref 0–0)
PLATELETS: 250 10*3/uL (ref 145–400)
RBC: 3.14 10*6/uL — AB (ref 3.70–5.45)
RDW: 14.7 % — AB (ref 11.2–14.5)
WBC: 2.3 10*3/uL — ABNORMAL LOW (ref 3.9–10.3)
lymph#: 0.8 10*3/uL — ABNORMAL LOW (ref 0.9–3.3)

## 2014-07-31 LAB — COMPREHENSIVE METABOLIC PANEL (CC13)
ALK PHOS: 104 U/L (ref 40–150)
ALT: 27 U/L (ref 0–55)
ANION GAP: 8 meq/L (ref 3–11)
AST: 17 U/L (ref 5–34)
Albumin: 3.4 g/dL — ABNORMAL LOW (ref 3.5–5.0)
BILIRUBIN TOTAL: 0.86 mg/dL (ref 0.20–1.20)
BUN: 6.4 mg/dL — ABNORMAL LOW (ref 7.0–26.0)
CO2: 22 mEq/L (ref 22–29)
Calcium: 9.4 mg/dL (ref 8.4–10.4)
Chloride: 109 mEq/L (ref 98–109)
Creatinine: 0.8 mg/dL (ref 0.6–1.1)
Glucose: 102 mg/dl (ref 70–140)
Potassium: 4.1 mEq/L (ref 3.5–5.1)
Sodium: 139 mEq/L (ref 136–145)
Total Protein: 6.6 g/dL (ref 6.4–8.3)

## 2014-07-31 MED ORDER — FAMOTIDINE IN NACL 20-0.9 MG/50ML-% IV SOLN
20.0000 mg | Freq: Once | INTRAVENOUS | Status: AC
  Administered 2014-07-31: 20 mg via INTRAVENOUS

## 2014-07-31 MED ORDER — DIPHENHYDRAMINE HCL 50 MG/ML IJ SOLN
25.0000 mg | Freq: Once | INTRAMUSCULAR | Status: AC
  Administered 2014-07-31: 25 mg via INTRAVENOUS

## 2014-07-31 MED ORDER — SODIUM CHLORIDE 0.9 % IV SOLN
214.2400 mg | Freq: Once | INTRAVENOUS | Status: AC
  Administered 2014-07-31: 210 mg via INTRAVENOUS
  Filled 2014-07-31: qty 21

## 2014-07-31 MED ORDER — DEXAMETHASONE SODIUM PHOSPHATE 20 MG/5ML IJ SOLN
INTRAMUSCULAR | Status: AC
  Filled 2014-07-31: qty 5

## 2014-07-31 MED ORDER — PACLITAXEL CHEMO INJECTION 300 MG/50ML
56.0000 mg/m2 | Freq: Once | INTRAVENOUS | Status: AC
  Administered 2014-07-31: 120 mg via INTRAVENOUS
  Filled 2014-07-31: qty 20

## 2014-07-31 MED ORDER — ONDANSETRON 16 MG/50ML IVPB (CHCC)
16.0000 mg | Freq: Once | INTRAVENOUS | Status: AC
  Administered 2014-07-31: 16 mg via INTRAVENOUS

## 2014-07-31 MED ORDER — SODIUM CHLORIDE 0.9 % IJ SOLN
10.0000 mL | INTRAMUSCULAR | Status: DC | PRN
  Administered 2014-07-31: 10 mL
  Filled 2014-07-31: qty 10

## 2014-07-31 MED ORDER — HEPARIN SOD (PORK) LOCK FLUSH 100 UNIT/ML IV SOLN
500.0000 [IU] | Freq: Once | INTRAVENOUS | Status: AC | PRN
  Administered 2014-07-31: 500 [IU]
  Filled 2014-07-31: qty 5

## 2014-07-31 MED ORDER — FAMOTIDINE IN NACL 20-0.9 MG/50ML-% IV SOLN
INTRAVENOUS | Status: AC
  Filled 2014-07-31: qty 50

## 2014-07-31 MED ORDER — SODIUM CHLORIDE 0.9 % IV SOLN
Freq: Once | INTRAVENOUS | Status: AC
  Administered 2014-07-31: 10:00:00 via INTRAVENOUS

## 2014-07-31 MED ORDER — DEXAMETHASONE SODIUM PHOSPHATE 20 MG/5ML IJ SOLN
20.0000 mg | Freq: Once | INTRAMUSCULAR | Status: AC
  Administered 2014-07-31: 20 mg via INTRAVENOUS

## 2014-07-31 MED ORDER — SODIUM CHLORIDE 0.9 % IJ SOLN
10.0000 mL | INTRAMUSCULAR | Status: DC | PRN
Start: 2014-07-31 — End: 2014-07-31
  Administered 2014-07-31: 10 mL via INTRAVENOUS
  Filled 2014-07-31: qty 10

## 2014-07-31 MED ORDER — DIPHENHYDRAMINE HCL 50 MG/ML IJ SOLN
INTRAMUSCULAR | Status: AC
  Filled 2014-07-31: qty 1

## 2014-07-31 MED ORDER — ONDANSETRON 16 MG/50ML IVPB (CHCC)
INTRAVENOUS | Status: AC
  Filled 2014-07-31: qty 16

## 2014-07-31 NOTE — Patient Instructions (Signed)
[facility] Discharge Instructions for Patients Receiving Chemotherapy  Today you received the following chemotherapy agents Paclitaxel/Carboplatin.   To help prevent nausea and vomiting after your treatment, we encourage you to take your nausea medication as directed.    If you develop nausea and vomiting that is not controlled by your nausea medication, call the clinic.   BELOW ARE SYMPTOMS THAT SHOULD BE REPORTED IMMEDIATELY:  *FEVER GREATER THAN 100.5 F  *CHILLS WITH OR WITHOUT FEVER  NAUSEA AND VOMITING THAT IS NOT CONTROLLED WITH YOUR NAUSEA MEDICATION  *UNUSUAL SHORTNESS OF BREATH  *UNUSUAL BRUISING OR BLEEDING  TENDERNESS IN MOUTH AND THROAT WITH OR WITHOUT PRESENCE OF ULCERS  *URINARY PROBLEMS  *BOWEL PROBLEMS  UNUSUAL RASH Items with * indicate a potential emergency and should be followed up as soon as possible.  Feel free to call the clinic you have any questions or concerns. The clinic phone number is [phone].    

## 2014-07-31 NOTE — Patient Instructions (Signed)

## 2014-07-31 NOTE — Progress Notes (Signed)
OK to treat with ANC: 1.2 per Susanne Borders NP.

## 2014-07-31 NOTE — Progress Notes (Signed)
[facility]  Telephone:(336) [phone] Fax:(336) [phone]     ID: [name] DOB: 06-23-1951  MR#: [id number]  LZA#:[id number]  PCP: Gwynneth Aliment, MD GYN: SU: Emelia Loron OTHER MD: Antony Blackbird  CHIEF COMPLAINT: Stage III breast cancer, triple negative   CURRENT TREATMENT: Neoadjuvant chemotherapy  BREAST CANCER HISTORY: From the original intake note:  [name] herself palpated a mass in her left breast, and immediately brought it to the attention of her primary care physician, Dr. Allyne Gee, who confirmed the finding and setup the patient for bilateral diagnostic mammography at the breast Center 02/20/2014. This showed a high density mass in the outer left breast measuring up to 3.7 cm. It corresponded to the site of palpable concern. In addition the normal 4 logically abnormal lymph nodes in the left axilla. The mass was palpable by exam, and by ultrasonography measured 2.6 cm. There were enlarged and morphologically abnormal lymph nodes in the left axilla, largest measuring 3 cm.  Biopsy of the left breast mass in question and 1 of the abnormal axillary lymph nodes 02/22/2014 showed ([id number]) biopsies to be positive for an invasive ductal carcinoma, grade 3, triple negative, with an MIB-1 of 90%. Specifically the HER-2 signals ratio was 1.05, and the number per cell 2.00.  MRI of the breasts 03/01/2014 showed a 3.8 cm enhancing mass with areas of apparent necrosis in the left breast, as well as the biopsy tract extending laterally, the combined measure 5.3 cm. There were no other masses or areas of abnormal enhancement. There were multiple abnormally enlarged left axillary lymph nodes with loss of the normal fatty hilum. These included level I and retropectoral lymph nodes.  The patient's subsequent history is as detailed below   INTERVAL HISTORY: [name] returns today for follow up of her breast cancer. Today is day 1, cycle 10 of 12 planned weekly cycles of  paclitaxel and carboplatin. The interval history is generally unremarkable. [name] says she feels "like she could run a marathon this week." Overall she feels well, her energy level is up and she has no complaints.   REVIEW OF SYSTEMS: [name] denies fevers, chills, nausea, vomiting, or changes in bowel or bladder habits. She has no mouth sores or decrease in appetite, though her taste sensation is changed after treatment. She has no shortness of breath, chest pain, cough, or palpitations, or fatigue. The "softness" to her fingertips is stable and unchanged from previous weeks. A detailed review of systems is otherwise noncontributory.   PAST MEDICAL HISTORY: Past Medical History  Diagnosis Date  . Colon cancer   . Hx of rotator cuff surgery     PAST SURGICAL HISTORY: Past Surgical History  Procedure Laterality Date  . Abdominal hysterectomy    . Colon surgery      colectomy/colostomy with takedown    FAMILY HISTORY Family History  Problem Relation Age of Onset  . Cancer Mother 46    breast  . Cancer Maternal Aunt     breast cancer at unknown age   the patient's father died at the age of 12 following a stroke in the setting of diabetes; the patient's mother is living at 70. The patient has 3 brothers and 2 sisters. [name], Epimenio Sarin, was diagnosed with breast cancer in her early 44s. There is no other breast or ovarian cancer in the family. The patient herself was diagnosed with watts by history he is an incidentally found stage I colon carcinoma noted at the time of her  hysterectomy. She had a partial colectomy but no adjuvant treatment. We have no records regarding that procedure  GYNECOLOGIC HISTORY:  No LMP recorded. Patient has had a hysterectomy. Menarche age 14, first live birth age 46, the patient underwent total abdominal hysterectomy with bilateral salpingo-oophorectomy in her 21s. She did not take hormone replacement. She did not take oral contraceptives at any  point.  SOCIAL HISTORY:  [name] work for [employer] for about 40 years, retiring in 2008. She is divorced and lives alone, with no pets. Her daughter [name] works for [employer] in [employer]. The second daughter, [name], works as a Research scientist (physical sciences) for [employer]. The patient has 6 grandchildren and one great-grandchild. She attends a [employer].    ADVANCED DIRECTIVES: Not in place. The patient intends to name her daughter [name]. [name] is work number is [phone]. On the patient's 03/01/2014 visit she was given a copy of the appropriate documents to complete and notarize at her discretion   HEALTH MAINTENANCE: History  Substance Use Topics  . Smoking status: Never Smoker   . Smokeless tobacco: Not on file  . Alcohol Use: No     Colonoscopy: 2000  PAP: May 2015  Bone density: 05/23/2004, at [facility] hospital; normal  Lipid panel:  No Known Allergies  Current Outpatient Prescriptions  Medication Sig Dispense Refill  . dexamethasone (DECADRON) 4 MG tablet Take 2 tablets by mouth once a day on the day after chemotherapy and then take 2 tablets two times a day for 2 days. Take with food. 30 tablet 1  . lidocaine-prilocaine (EMLA) cream Apply 1 application topically once. Apply to port 1-2 hours prior to access. 30 g 1  . loteprednol (LOTEMAX) 0.5 % ophthalmic suspension Place 1 drop into both eyes 3 (three) times daily.    . ondansetron (ZOFRAN) 8 MG tablet Take 1 tablet (8 mg total) by mouth 2 (two) times daily as needed. Start on the third day after chemotherapy. 30 tablet 0  . potassium chloride (K-DUR) 10 MEQ tablet Take 1 tablet (10 mEq total) by mouth 2 (two) times daily. 60 tablet 4  . prochlorperazine (COMPAZINE) 10 MG tablet Take 1 tablet (10 mg total) by mouth every 6 (six) hours as needed for nausea or vomiting. 30 tablet 0  . tobramycin-dexamethasone (TOBRADEX) ophthalmic solution Place 1 drop into both eyes every 4 (four)  hours while awake. 5 mL 0  . acetaminophen (TYLENOL) 500 MG tablet Take 1,000 mg by mouth every 6 (six) hours as needed for mild pain or moderate pain.     No current facility-administered medications for this visit.    OBJECTIVE: Middle-aged [place] American woman who appears stated age 26 Vitals:   07/31/14 0918  BP: 127/89  Pulse: 101  Temp: 98.6 F (37 C)  Resp: 20     Body mass index is 44.18 kg/(m^2).    ECOG FS:1 - Symptomatic but completely ambulatory  Bilateral nailbeds/palms hyperpigmented, but clearing when compared with their appearance last week Sclerae unicteric, pupils equal and reactive Oropharynx clear and moist-- no thrush No cervical or supraclavicular adenopathy Lungs no rales or rhonchi Heart regular rate and rhythm Abd soft, nontender, positive bowel sounds MSK no focal spinal tenderness, no upper extremity lymphedema Neuro: nonfocal, well oriented, appropriate affect Breasts: deferred  LAB RESULTS:  I No results found for: SPEP  Lab Results  Component Value Date   WBC 2.3* 07/31/2014   NEUTROABS 1.2* 07/31/2014   HGB 9.6*  07/31/2014   HCT 29.1* 07/31/2014   MCV 92.7 07/31/2014   PLT 250 07/31/2014      Chemistry      Component Value Date/Time   NA 139 07/31/2014 0842   NA 143 04/24/2014 0958   K 4.1 07/31/2014 0842   K 3.5 04/24/2014 0958   CL 105 04/24/2014 0958   CO2 22 07/31/2014 0842   CO2 25 04/24/2014 0958   BUN 6.4* 07/31/2014 0842   BUN 12 04/24/2014 0958   CREATININE 0.8 07/31/2014 0842   CREATININE 0.93 04/24/2014 0958      Component Value Date/Time   CALCIUM 9.4 07/31/2014 0842   CALCIUM 9.3 04/24/2014 0958   ALKPHOS 104 07/31/2014 0842   ALKPHOS 121* 04/24/2014 0958   AST 17 07/31/2014 0842   AST 13 04/24/2014 0958   ALT 27 07/31/2014 0842   ALT 25 04/24/2014 0958   BILITOT 0.86 07/31/2014 0842   BILITOT 0.3 04/24/2014 0958       No results found for: LABCA2  No components found for: [id number]  No results  for input(s): INR in the last 168 hours.  Urinalysis    Component Value Date/Time   COLORURINE YELLOW 04/05/2008 1120   APPEARANCEUR CLEAR 04/05/2008 1120   LABSPEC 1.021 04/05/2008 1120   PHURINE 6.0 04/05/2008 1120   GLUCOSEU NEGATIVE 04/05/2008 1120   HGBUR NEGATIVE 04/05/2008 1120   BILIRUBINUR NEGATIVE 04/05/2008 1120   KETONESUR NEGATIVE 04/05/2008 1120   PROTEINUR NEGATIVE 04/05/2008 1120   UROBILINOGEN 0.2 04/05/2008 1120   NITRITE NEGATIVE 04/05/2008 1120   LEUKOCYTESUR  04/05/2008 1120    NEGATIVE MICROSCOPIC NOT DONE ON URINES WITH NEGATIVE PROTEIN, BLOOD, LEUKOCYTES, NITRITE, OR GLUCOSE <1000 mg/dL.    STUDIES: No results found.  ASSESSMENT: 63 y.o. [place] woman s/p breast and left axillary lymph node biopsy 02/22/2014, both positive for a clinical T2 N2, stage IIIA invasive ductal carcinoma, grade 3, triple negative, with an MIB-1 of 90%  (1) neoadjuvant chemotherapy started 03/13/2014, with cyclophosphamide and doxorubicin in dose dense fashion x4, with Neulasta support on day 2, completed 04/24/2014, to be followed by weekly carboplatin and paclitaxel x12, paclitaxel reduced during cycle 5 and cycle 7 because of elevated bilirubin levels  (2) definitive surgery to follow chemotherapy  (3) radiation to follow surgery  (4) remote history of partial colectomy for early stage colon cancer incidentally found during TAH/BSO in the 1980s  (5) genetics testing since 04/03/2014, 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 The labs were reviewed in detail and her Stryker continues to drop. This week it is 1.2. I discussed this with the patient, and she understands that she needs to return for neupogen for the next 3 days. This has worked  well in the past and has allowed her to continue with treatments as scheduled. We will proceed with cycle 10 of paclitaxel and carboplatin today.   Kambra will return next week for labs, an office visit, and the start of cycle 11. She understands and agrees with this plan. She knows the goal of treatment in her case is cure. She has been encouraged to call with any issues that might arise before her next visit here.   Marcelino Duster, NP   07/31/2014 9:45 AM

## 2014-07-31 NOTE — Telephone Encounter (Signed)
, °

## 2014-08-01 ENCOUNTER — Ambulatory Visit (HOSPITAL_BASED_OUTPATIENT_CLINIC_OR_DEPARTMENT_OTHER): Payer: Medicare Other

## 2014-08-01 DIAGNOSIS — C50812 Malignant neoplasm of overlapping sites of left female breast: Secondary | ICD-10-CM

## 2014-08-01 DIAGNOSIS — C773 Secondary and unspecified malignant neoplasm of axilla and upper limb lymph nodes: Secondary | ICD-10-CM

## 2014-08-01 DIAGNOSIS — Z5189 Encounter for other specified aftercare: Secondary | ICD-10-CM | POA: Diagnosis not present

## 2014-08-01 DIAGNOSIS — D709 Neutropenia, unspecified: Secondary | ICD-10-CM

## 2014-08-01 MED ORDER — TBO-FILGRASTIM 480 MCG/0.8ML ~~LOC~~ SOSY
480.0000 ug | PREFILLED_SYRINGE | Freq: Once | SUBCUTANEOUS | Status: AC
  Administered 2014-08-01: 480 ug via SUBCUTANEOUS
  Filled 2014-08-01: qty 0.8

## 2014-08-01 NOTE — Patient Instructions (Signed)

## 2014-08-02 ENCOUNTER — Ambulatory Visit (HOSPITAL_BASED_OUTPATIENT_CLINIC_OR_DEPARTMENT_OTHER): Payer: Medicare Other

## 2014-08-02 DIAGNOSIS — D709 Neutropenia, unspecified: Secondary | ICD-10-CM

## 2014-08-02 DIAGNOSIS — C50812 Malignant neoplasm of overlapping sites of left female breast: Secondary | ICD-10-CM

## 2014-08-02 DIAGNOSIS — C773 Secondary and unspecified malignant neoplasm of axilla and upper limb lymph nodes: Secondary | ICD-10-CM

## 2014-08-02 DIAGNOSIS — Z5189 Encounter for other specified aftercare: Secondary | ICD-10-CM | POA: Diagnosis not present

## 2014-08-02 MED ORDER — TBO-FILGRASTIM 480 MCG/0.8ML ~~LOC~~ SOSY
480.0000 ug | PREFILLED_SYRINGE | Freq: Once | SUBCUTANEOUS | Status: AC
  Administered 2014-08-02: 480 ug via SUBCUTANEOUS
  Filled 2014-08-02: qty 0.8

## 2014-08-03 ENCOUNTER — Ambulatory Visit (HOSPITAL_BASED_OUTPATIENT_CLINIC_OR_DEPARTMENT_OTHER): Payer: Medicare Other

## 2014-08-03 DIAGNOSIS — C50812 Malignant neoplasm of overlapping sites of left female breast: Secondary | ICD-10-CM | POA: Diagnosis not present

## 2014-08-03 DIAGNOSIS — C773 Secondary and unspecified malignant neoplasm of axilla and upper limb lymph nodes: Secondary | ICD-10-CM | POA: Diagnosis not present

## 2014-08-03 DIAGNOSIS — Z5189 Encounter for other specified aftercare: Secondary | ICD-10-CM | POA: Diagnosis not present

## 2014-08-03 DIAGNOSIS — D709 Neutropenia, unspecified: Secondary | ICD-10-CM

## 2014-08-03 MED ORDER — TBO-FILGRASTIM 480 MCG/0.8ML ~~LOC~~ SOSY
480.0000 ug | PREFILLED_SYRINGE | Freq: Once | SUBCUTANEOUS | Status: AC
  Administered 2014-08-03: 480 ug via SUBCUTANEOUS
  Filled 2014-08-03: qty 0.8

## 2014-08-03 NOTE — Patient Instructions (Signed)
[facility] Discharge Instructions for Patients Receiving Chemotherapy  Today you received the following chemotherapy agents Granix  To help prevent nausea and vomiting after your treatment, we encourage you to take your nausea medication as directed/prescribed    If you develop nausea and vomiting that is not controlled by your nausea medication, call the clinic.   BELOW ARE SYMPTOMS THAT SHOULD BE REPORTED IMMEDIATELY:  *FEVER GREATER THAN 100.5 F  *CHILLS WITH OR WITHOUT FEVER  NAUSEA AND VOMITING THAT IS NOT CONTROLLED WITH YOUR NAUSEA MEDICATION  *UNUSUAL SHORTNESS OF BREATH  *UNUSUAL BRUISING OR BLEEDING  TENDERNESS IN MOUTH AND THROAT WITH OR WITHOUT PRESENCE OF ULCERS  *URINARY PROBLEMS  *BOWEL PROBLEMS  UNUSUAL RASH Items with * indicate a potential emergency and should be followed up as soon as possible.  Feel free to call the clinic you have any questions or concerns. The clinic phone number is [phone].

## 2014-08-07 ENCOUNTER — Encounter: Payer: Self-pay | Admitting: Nurse Practitioner

## 2014-08-07 ENCOUNTER — Other Ambulatory Visit: Payer: Self-pay | Admitting: Oncology

## 2014-08-07 ENCOUNTER — Ambulatory Visit (HOSPITAL_BASED_OUTPATIENT_CLINIC_OR_DEPARTMENT_OTHER): Payer: Medicare Other

## 2014-08-07 ENCOUNTER — Telehealth: Payer: Self-pay | Admitting: Nurse Practitioner

## 2014-08-07 ENCOUNTER — Other Ambulatory Visit (HOSPITAL_BASED_OUTPATIENT_CLINIC_OR_DEPARTMENT_OTHER): Payer: Medicare Other

## 2014-08-07 ENCOUNTER — Ambulatory Visit: Payer: Medicare Other

## 2014-08-07 ENCOUNTER — Ambulatory Visit (HOSPITAL_BASED_OUTPATIENT_CLINIC_OR_DEPARTMENT_OTHER): Payer: Medicare Other | Admitting: Nurse Practitioner

## 2014-08-07 VITALS — BP 122/81 | HR 115 | Temp 97.8°F

## 2014-08-07 VITALS — Resp 18 | Ht 61.0 in | Wt 231.9 lb

## 2014-08-07 DIAGNOSIS — Z171 Estrogen receptor negative status [ER-]: Secondary | ICD-10-CM

## 2014-08-07 DIAGNOSIS — C773 Secondary and unspecified malignant neoplasm of axilla and upper limb lymph nodes: Secondary | ICD-10-CM

## 2014-08-07 DIAGNOSIS — Z5111 Encounter for antineoplastic chemotherapy: Secondary | ICD-10-CM

## 2014-08-07 DIAGNOSIS — C50812 Malignant neoplasm of overlapping sites of left female breast: Secondary | ICD-10-CM

## 2014-08-07 DIAGNOSIS — E041 Nontoxic single thyroid nodule: Secondary | ICD-10-CM

## 2014-08-07 DIAGNOSIS — C50412 Malignant neoplasm of upper-outer quadrant of left female breast: Secondary | ICD-10-CM

## 2014-08-07 DIAGNOSIS — Z95828 Presence of other vascular implants and grafts: Secondary | ICD-10-CM

## 2014-08-07 DIAGNOSIS — R911 Solitary pulmonary nodule: Secondary | ICD-10-CM

## 2014-08-07 LAB — COMPREHENSIVE METABOLIC PANEL (CC13)
ALT: 26 U/L (ref 0–55)
AST: 16 U/L (ref 5–34)
Albumin: 3.5 g/dL (ref 3.5–5.0)
Alkaline Phosphatase: 123 U/L (ref 40–150)
Anion Gap: 11 mEq/L (ref 3–11)
BILIRUBIN TOTAL: 0.7 mg/dL (ref 0.20–1.20)
BUN: 8.1 mg/dL (ref 7.0–26.0)
CO2: 22 mEq/L (ref 22–29)
CREATININE: 1 mg/dL (ref 0.6–1.1)
Calcium: 9.6 mg/dL (ref 8.4–10.4)
Chloride: 107 mEq/L (ref 98–109)
GLUCOSE: 105 mg/dL (ref 70–140)
Potassium: 3.5 mEq/L (ref 3.5–5.1)
Sodium: 139 mEq/L (ref 136–145)
Total Protein: 6.8 g/dL (ref 6.4–8.3)

## 2014-08-07 LAB — CBC WITH DIFFERENTIAL/PLATELET
BASO%: 0.6 % (ref 0.0–2.0)
Basophils Absolute: 0 10*3/uL (ref 0.0–0.1)
EOS ABS: 0 10*3/uL (ref 0.0–0.5)
EOS%: 0.6 % (ref 0.0–7.0)
HCT: 31.9 % — ABNORMAL LOW (ref 34.8–46.6)
HGB: 10.6 g/dL — ABNORMAL LOW (ref 11.6–15.9)
LYMPH%: 32.3 % (ref 14.0–49.7)
MCH: 31.3 pg (ref 25.1–34.0)
MCHC: 33.2 g/dL (ref 31.5–36.0)
MCV: 94.1 fL (ref 79.5–101.0)
MONO#: 0.7 10*3/uL (ref 0.1–0.9)
MONO%: 19.1 % — AB (ref 0.0–14.0)
NEUT#: 1.7 10*3/uL (ref 1.5–6.5)
NEUT%: 47.4 % (ref 38.4–76.8)
PLATELETS: 187 10*3/uL (ref 145–400)
RBC: 3.39 10*6/uL — ABNORMAL LOW (ref 3.70–5.45)
RDW: 15.4 % — ABNORMAL HIGH (ref 11.2–14.5)
WBC: 3.6 10*3/uL — ABNORMAL LOW (ref 3.9–10.3)
lymph#: 1.2 10*3/uL (ref 0.9–3.3)

## 2014-08-07 MED ORDER — SODIUM CHLORIDE 0.9 % IJ SOLN
10.0000 mL | INTRAMUSCULAR | Status: DC | PRN
Start: 2014-08-07 — End: 2014-08-07
  Administered 2014-08-07: 10 mL
  Filled 2014-08-07: qty 10

## 2014-08-07 MED ORDER — FAMOTIDINE IN NACL 20-0.9 MG/50ML-% IV SOLN
INTRAVENOUS | Status: AC
  Filled 2014-08-07: qty 50

## 2014-08-07 MED ORDER — SODIUM CHLORIDE 0.9 % IV SOLN
214.2400 mg | Freq: Once | INTRAVENOUS | Status: AC
  Administered 2014-08-07: 210 mg via INTRAVENOUS
  Filled 2014-08-07: qty 21

## 2014-08-07 MED ORDER — PACLITAXEL CHEMO INJECTION 300 MG/50ML
56.0000 mg/m2 | Freq: Once | INTRAVENOUS | Status: AC
  Administered 2014-08-07: 120 mg via INTRAVENOUS
  Filled 2014-08-07: qty 20

## 2014-08-07 MED ORDER — ONDANSETRON 16 MG/50ML IVPB (CHCC)
16.0000 mg | Freq: Once | INTRAVENOUS | Status: AC
  Administered 2014-08-07: 16 mg via INTRAVENOUS

## 2014-08-07 MED ORDER — HEPARIN SOD (PORK) LOCK FLUSH 100 UNIT/ML IV SOLN
500.0000 [IU] | Freq: Once | INTRAVENOUS | Status: AC | PRN
  Administered 2014-08-07: 500 [IU]
  Filled 2014-08-07: qty 5

## 2014-08-07 MED ORDER — SODIUM CHLORIDE 0.9 % IJ SOLN
10.0000 mL | INTRAMUSCULAR | Status: DC | PRN
  Administered 2014-08-07: 10 mL via INTRAVENOUS
  Filled 2014-08-07: qty 10

## 2014-08-07 MED ORDER — DEXAMETHASONE SODIUM PHOSPHATE 20 MG/5ML IJ SOLN
INTRAMUSCULAR | Status: AC
  Filled 2014-08-07: qty 5

## 2014-08-07 MED ORDER — SODIUM CHLORIDE 0.9 % IV SOLN
Freq: Once | INTRAVENOUS | Status: AC
  Administered 2014-08-07: 11:00:00 via INTRAVENOUS

## 2014-08-07 MED ORDER — ONDANSETRON 16 MG/50ML IVPB (CHCC)
INTRAVENOUS | Status: AC
  Filled 2014-08-07: qty 16

## 2014-08-07 MED ORDER — DEXAMETHASONE SODIUM PHOSPHATE 20 MG/5ML IJ SOLN
20.0000 mg | Freq: Once | INTRAMUSCULAR | Status: AC
  Administered 2014-08-07: 20 mg via INTRAVENOUS

## 2014-08-07 MED ORDER — FAMOTIDINE IN NACL 20-0.9 MG/50ML-% IV SOLN
20.0000 mg | Freq: Once | INTRAVENOUS | Status: AC
  Administered 2014-08-07: 20 mg via INTRAVENOUS

## 2014-08-07 MED ORDER — DIPHENHYDRAMINE HCL 50 MG/ML IJ SOLN
INTRAMUSCULAR | Status: AC
  Filled 2014-08-07: qty 1

## 2014-08-07 MED ORDER — DIPHENHYDRAMINE HCL 50 MG/ML IJ SOLN
25.0000 mg | Freq: Once | INTRAMUSCULAR | Status: AC
  Administered 2014-08-07: 25 mg via INTRAVENOUS

## 2014-08-07 NOTE — Patient Instructions (Signed)
[facility] Discharge Instructions for Patients Receiving Chemotherapy  Today you received the following chemotherapy agents: Taxol and Carboplatin.  To help prevent nausea and vomiting after your treatment, we encourage you to take your nausea medication: Compazine. Take one every six hours as needed.   If you develop nausea and vomiting that is not controlled by your nausea medication, call the clinic.   BELOW ARE SYMPTOMS THAT SHOULD BE REPORTED IMMEDIATELY:  *FEVER GREATER THAN 100.5 F  *CHILLS WITH OR WITHOUT FEVER  NAUSEA AND VOMITING THAT IS NOT CONTROLLED WITH YOUR NAUSEA MEDICATION  *UNUSUAL SHORTNESS OF BREATH  *UNUSUAL BRUISING OR BLEEDING  TENDERNESS IN MOUTH AND THROAT WITH OR WITHOUT PRESENCE OF ULCERS  *URINARY PROBLEMS  *BOWEL PROBLEMS  UNUSUAL RASH Items with * indicate a potential emergency and should be followed up as soon as possible.  Feel free to call the clinic should you have any questions or concerns. The clinic phone number is [phone].

## 2014-08-07 NOTE — Telephone Encounter (Signed)
, °

## 2014-08-07 NOTE — Progress Notes (Signed)
[facility]  [phone] [phone]     ID: [name] DOB: 01/21/1951  MR#: [id number]  OEU#:[id number]  PCP: Maximino Greenland, MD GYN: SU: Rolm Bookbinder OTHER MD: Gery Pray  CHIEF COMPLAINT: Stage III breast cancer, triple negative   CURRENT TREATMENT: Neoadjuvant chemotherapy  BREAST CANCER HISTORY: From the original intake note:  [name] herself palpated a mass in her left breast, and immediately brought it to the attention of her primary care physician, Dr. Baird Cancer, who confirmed the finding and setup the patient for bilateral diagnostic mammography at the breast Center 02/20/2014. This showed a high density mass in the outer left breast measuring up to 3.7 cm. It corresponded to the site of palpable concern. In addition the normal 4 logically abnormal lymph nodes in the left axilla. The mass was palpable by exam, and by ultrasonography measured 2.6 cm. There were enlarged and morphologically abnormal lymph nodes in the left axilla, largest measuring 3 cm.  Biopsy of the left breast mass in question and 1 of the abnormal axillary lymph nodes 02/22/2014 showed ([id number]) biopsies to be positive for an invasive ductal carcinoma, grade 3, triple negative, with an MIB-1 of 90%. Specifically the HER-2 signals ratio was 1.05, and the number per cell 2.00.  MRI of the breasts 03/01/2014 showed a 3.8 cm enhancing mass with areas of apparent necrosis in the left breast, as well as the biopsy tract extending laterally, the combined measure 5.3 cm. There were no other masses or areas of abnormal enhancement. There were multiple abnormally enlarged left axillary lymph nodes with loss of the normal fatty hilum. These included level I and retropectoral lymph nodes.  The patient's subsequent history is as detailed below   INTERVAL HISTORY: [name] returns today for follow up of her breast cancer. Today is day 1, cycle 11 of 12 planned weekly cycles of  paclitaxel and carboplatin.  [name] is doing well today and is excited about only having to complete 2 more cycle of chemo. She denies any changes from the previous week.   REVIEW OF SYSTEMS: [name] denies fevers, chills, nausea, vomiting, or changes in bowel or bladder habits. She has no mouth sores, but her appetite is decreased this week, secondary to taste changes. She has no shortness of breath, chest pain, cough, or palpitations, or fatigue. The "softness" to her fingertips is stable and unchanged from previous weeks. A detailed review of systems is otherwise noncontributory.   PAST MEDICAL HISTORY: Past Medical History  Diagnosis Date  . Colon cancer   . Hx of rotator cuff surgery     PAST SURGICAL HISTORY: Past Surgical History  Procedure Laterality Date  . Abdominal hysterectomy    . Colon surgery      colectomy/colostomy with takedown    FAMILY HISTORY Family History  Problem Relation Age of Onset  . Cancer Mother 88    breast  . Cancer Maternal Aunt     breast cancer at unknown age   the patient's father died at the age of 29 following a stroke in the setting of diabetes; the patient's mother is living at 12. The patient has 3 brothers and 2 sisters. [name], [name], was diagnosed with breast cancer in her early 94s. There is no other breast or ovarian cancer in the family. The patient herself was diagnosed with watts by history he is an incidentally found stage I colon carcinoma noted at the time of her hysterectomy. She had a partial colectomy  but no adjuvant treatment. We have no records regarding that procedure  GYNECOLOGIC HISTORY:  No LMP recorded. Patient has had a hysterectomy. Menarche age 46, first live birth age 75, the patient underwent total abdominal hysterectomy with bilateral salpingo-oophorectomy in her 66s. She did not take hormone replacement. She did not take oral contraceptives at any point.  SOCIAL HISTORY:  Azharia work for [employer]  for about 40 years, retiring in 2008. She is divorced and lives alone, with no pets. Her daughter [name] works for [employer] in [employer]. The second daughter, [name], works as a Research scientist (physical sciences) for [employer]. The patient has 6 grandchildren and one great-grandchild. She attends a [employer].    ADVANCED DIRECTIVES: Not in place. The patient intends to name her daughter [name]. [name] is work number is [id number]. On the patient's 03/01/2014 visit she was given a copy of the appropriate documents to complete and notarize at her discretion   HEALTH MAINTENANCE: History  Substance Use Topics  . Smoking status: Never Smoker   . Smokeless tobacco: Not on file  . Alcohol Use: No     Colonoscopy: 2000  PAP: May 2015  Bone density: 05/23/2004, at [facility]; normal  Lipid panel:  No Known Allergies  Current Outpatient Prescriptions  Medication Sig Dispense Refill  . acetaminophen (TYLENOL) 500 MG tablet Take 1,000 mg by mouth every 6 (six) hours as needed for mild pain or moderate pain.    Marland Kitchen dexamethasone (DECADRON) 4 MG tablet Take 2 tablets by mouth once a day on the day after chemotherapy and then take 2 tablets two times a day for 2 days. Take with food. 30 tablet 1  . lidocaine-prilocaine (EMLA) cream Apply 1 application topically once. Apply to port 1-2 hours prior to access. 30 g 1  . loteprednol (LOTEMAX) 0.5 % ophthalmic suspension Place 1 drop into both eyes 3 (three) times daily.    . ondansetron (ZOFRAN) 8 MG tablet Take 1 tablet (8 mg total) by mouth 2 (two) times daily as needed. Start on the third day after chemotherapy. 30 tablet 0  . potassium chloride (K-DUR) 10 MEQ tablet Take 1 tablet (10 mEq total) by mouth 2 (two) times daily. 60 tablet 4  . prochlorperazine (COMPAZINE) 10 MG tablet Take 1 tablet (10 mg total) by mouth every 6 (six) hours as needed for nausea or vomiting. 30 tablet 0  . tobramycin-dexamethasone  (TOBRADEX) ophthalmic solution Place 1 drop into both eyes every 4 (four) hours while awake. 5 mL 0   No current facility-administered medications for this visit.   Facility-Administered Medications Ordered in Other Visits  Medication Dose Route Frequency Provider Last Rate Last Dose  . 0.9 %  sodium chloride infusion   Intravenous Once Chauncey Cruel, MD      . CARBOplatin (PARAPLATIN) 210 mg in sodium chloride 0.9 % 100 mL chemo infusion  210 mg Intravenous Once Chauncey Cruel, MD      . Dexamethasone Sodium Phosphate (DECADRON) injection 20 mg  20 mg Intravenous Once Chauncey Cruel, MD      . diphenhydrAMINE (BENADRYL) injection 25 mg  25 mg Intravenous Once Chauncey Cruel, MD      . famotidine (PEPCID) IVPB 20 mg  20 mg Intravenous Once Chauncey Cruel, MD      . heparin lock flush 100 unit/mL  500 Units Intracatheter Once PRN Chauncey Cruel, MD      . ondansetron [facility])  IVPB 16 mg  16 mg Intravenous Once Chauncey Cruel, MD      . PACLitaxel (TAXOL) 120 mg in dextrose 5 % 250 mL chemo infusion (</= 67m/m2)  56 mg/m2 (Treatment Plan Actual) Intravenous Once GChauncey Cruel MD      . sodium chloride 0.9 % injection 10 mL  10 mL Intracatheter PRN GChauncey Cruel MD        OBJECTIVE: Middle-aged [place] woman who appears stated age F53Vitals:   08/07/14 0957  Resp: 18     Body mass index is 43.84 kg/(m^2).    ECOG FS:1 - Symptomatic but completely ambulatory  Skin: warm, dry, bilateral nail beds/palms hyperpigmented HEENT: sclerae anicteric, conjunctivae pink, oropharynx clear. No thrush or mucositis.  Lymph Nodes: No cervical or supraclavicular lymphadenopathy  Lungs: clear to auscultation bilaterally, no rales, wheezes, or rhonci  Heart: regular rate and rhythm  Abdomen: round, soft, non tender, positive bowel sounds  Musculoskeletal: No focal spinal tenderness, no peripheral edema  Neuro: non focal, well oriented, positive affect  Breasts:  deferred  LAB RESULTS:  I No results found for: SPEP  Lab Results  Component Value Date   WBC 3.6* 08/07/2014   NEUTROABS 1.7 08/07/2014   HGB 10.6* 08/07/2014   HCT 31.9* 08/07/2014   MCV 94.1 08/07/2014   PLT 187 08/07/2014      Chemistry      Component Value Date/Time   NA 139 08/07/2014 0908   NA 143 04/24/2014 0958   K 3.5 08/07/2014 0908   K 3.5 04/24/2014 0958   CL 105 04/24/2014 0958   CO2 22 08/07/2014 0908   CO2 25 04/24/2014 0958   BUN 8.1 08/07/2014 0908   BUN 12 04/24/2014 0958   CREATININE 1.0 08/07/2014 0908   CREATININE 0.93 04/24/2014 0958      Component Value Date/Time   CALCIUM 9.6 08/07/2014 0908   CALCIUM 9.3 04/24/2014 0958   ALKPHOS 123 08/07/2014 0908   ALKPHOS 121* 04/24/2014 0958   AST 16 08/07/2014 0908   AST 13 04/24/2014 0958   ALT 26 08/07/2014 0908   ALT 25 04/24/2014 0958   BILITOT 0.70 08/07/2014 0908   BILITOT 0.3 04/24/2014 0958       No results found for: LABCA2  No components found for: [id number] No results for input(s): INR in the last 168 hours.  Urinalysis    Component Value Date/Time   COLORURINE YELLOW 04/05/2008 1120   APPEARANCEUR CLEAR 04/05/2008 1120   LABSPEC 1.021 04/05/2008 1120   PHURINE 6.0 04/05/2008 1120   GLUCOSEU NEGATIVE 04/05/2008 1120   HGBUR NEGATIVE 04/05/2008 1120   BILIRUBINUR NEGATIVE 04/05/2008 1120   KETONESUR NEGATIVE 04/05/2008 1120   PROTEINUR NEGATIVE 04/05/2008 1120   UROBILINOGEN 0.2 04/05/2008 1120   NITRITE NEGATIVE 04/05/2008 1120   LEUKOCYTESUR  04/05/2008 1120    NEGATIVE MICROSCOPIC NOT DONE ON URINES WITH NEGATIVE PROTEIN, BLOOD, LEUKOCYTES, NITRITE, OR GLUCOSE <1000 mg/dL.    STUDIES: No results found.  ASSESSMENT: 63y.o. [place] woman s/p breast and left axillary lymph node biopsy 02/22/2014, both positive for a clinical T2 N2, stage IIIA invasive ductal carcinoma, grade 3, triple negative, with an MIB-1 of 90%  (1) neoadjuvant chemotherapy started  03/13/2014, with cyclophosphamide and doxorubicin in dose dense fashion x4, with Neulasta support on day 2, completed 04/24/2014, to be followed by weekly carboplatin and paclitaxel x12, paclitaxel reduced during cycle 5 and cycle 7 because of elevated bilirubin levels  (2) definitive surgery  to follow chemotherapy  (3) radiation to follow surgery  (4) remote history of partial colectomy for early stage colon cancer incidentally found during [facility] in the 1980s  (5) genetics testing since 04/03/2014, 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 The labs were reviewed in detail and were entirely stable. She had 3 doses of neupogen last week and her ANC is up to 1.7. We will proceed with cycle 11 of paclitaxel and carboplatin.   I have placed a referral to Dr. Cristal Generous office. She is anticipated to have surgery some time this December. I have also placed orders for a breast MRI to be performed after the completeion of chemotherapy. She will return next week for her 12th and final dose of paclitaxel and carboplatin. She understands and agrees with this plan. She knows the goal of treatment in her case is cure. She has been encouraged to call with any issues that might arise before her next visit here.   Marcelino Duster, NP   08/07/2014 10:30 AM

## 2014-08-07 NOTE — Patient Instructions (Signed)

## 2014-08-14 ENCOUNTER — Encounter: Payer: Self-pay | Admitting: Nurse Practitioner

## 2014-08-14 ENCOUNTER — Ambulatory Visit (HOSPITAL_BASED_OUTPATIENT_CLINIC_OR_DEPARTMENT_OTHER): Payer: Medicare Other | Admitting: Nurse Practitioner

## 2014-08-14 ENCOUNTER — Other Ambulatory Visit (HOSPITAL_BASED_OUTPATIENT_CLINIC_OR_DEPARTMENT_OTHER): Payer: Medicare Other

## 2014-08-14 ENCOUNTER — Ambulatory Visit: Payer: Medicare Other

## 2014-08-14 ENCOUNTER — Ambulatory Visit (HOSPITAL_BASED_OUTPATIENT_CLINIC_OR_DEPARTMENT_OTHER): Payer: Medicare Other

## 2014-08-14 ENCOUNTER — Telehealth: Payer: Self-pay | Admitting: Oncology

## 2014-08-14 VITALS — BP 130/87 | HR 115 | Temp 98.1°F | Resp 18 | Ht 61.0 in | Wt 231.5 lb

## 2014-08-14 DIAGNOSIS — C50812 Malignant neoplasm of overlapping sites of left female breast: Secondary | ICD-10-CM

## 2014-08-14 DIAGNOSIS — Z5111 Encounter for antineoplastic chemotherapy: Secondary | ICD-10-CM

## 2014-08-14 DIAGNOSIS — C50412 Malignant neoplasm of upper-outer quadrant of left female breast: Secondary | ICD-10-CM

## 2014-08-14 DIAGNOSIS — C773 Secondary and unspecified malignant neoplasm of axilla and upper limb lymph nodes: Secondary | ICD-10-CM

## 2014-08-14 DIAGNOSIS — Z171 Estrogen receptor negative status [ER-]: Secondary | ICD-10-CM

## 2014-08-14 DIAGNOSIS — Z95828 Presence of other vascular implants and grafts: Secondary | ICD-10-CM

## 2014-08-14 LAB — CBC WITH DIFFERENTIAL/PLATELET
BASO%: 1.5 % (ref 0.0–2.0)
Basophils Absolute: 0 10*3/uL (ref 0.0–0.1)
EOS%: 1 % (ref 0.0–7.0)
Eosinophils Absolute: 0 10*3/uL (ref 0.0–0.5)
HCT: 29.6 % — ABNORMAL LOW (ref 34.8–46.6)
HGB: 10 g/dL — ABNORMAL LOW (ref 11.6–15.9)
LYMPH%: 32.5 % (ref 14.0–49.7)
MCH: 31.4 pg (ref 25.1–34.0)
MCHC: 33.8 g/dL (ref 31.5–36.0)
MCV: 93.1 fL (ref 79.5–101.0)
MONO#: 0.2 10*3/uL (ref 0.1–0.9)
MONO%: 10.8 % (ref 0.0–14.0)
NEUT#: 1.1 10*3/uL — ABNORMAL LOW (ref 1.5–6.5)
NEUT%: 54.2 % (ref 38.4–76.8)
Platelets: 147 10*3/uL (ref 145–400)
RBC: 3.18 10*6/uL — AB (ref 3.70–5.45)
RDW: 15.3 % — AB (ref 11.2–14.5)
WBC: 2 10*3/uL — AB (ref 3.9–10.3)
lymph#: 0.7 10*3/uL — ABNORMAL LOW (ref 0.9–3.3)

## 2014-08-14 LAB — COMPREHENSIVE METABOLIC PANEL (CC13)
ALK PHOS: 101 U/L (ref 40–150)
ALT: 27 U/L (ref 0–55)
AST: 18 U/L (ref 5–34)
Albumin: 3.3 g/dL — ABNORMAL LOW (ref 3.5–5.0)
Anion Gap: 10 mEq/L (ref 3–11)
BILIRUBIN TOTAL: 0.99 mg/dL (ref 0.20–1.20)
BUN: 5 mg/dL — ABNORMAL LOW (ref 7.0–26.0)
CO2: 25 meq/L (ref 22–29)
Calcium: 9.7 mg/dL (ref 8.4–10.4)
Chloride: 105 mEq/L (ref 98–109)
Creatinine: 0.8 mg/dL (ref 0.6–1.1)
Glucose: 120 mg/dl (ref 70–140)
POTASSIUM: 3.5 meq/L (ref 3.5–5.1)
SODIUM: 140 meq/L (ref 136–145)
TOTAL PROTEIN: 6.4 g/dL (ref 6.4–8.3)

## 2014-08-14 MED ORDER — FAMOTIDINE IN NACL 20-0.9 MG/50ML-% IV SOLN
INTRAVENOUS | Status: AC
  Filled 2014-08-14: qty 50

## 2014-08-14 MED ORDER — DIPHENHYDRAMINE HCL 50 MG/ML IJ SOLN
25.0000 mg | Freq: Once | INTRAMUSCULAR | Status: AC
  Administered 2014-08-14: 25 mg via INTRAVENOUS

## 2014-08-14 MED ORDER — SODIUM CHLORIDE 0.9 % IJ SOLN
10.0000 mL | INTRAMUSCULAR | Status: DC | PRN
  Administered 2014-08-14: 10 mL via INTRAVENOUS
  Filled 2014-08-14: qty 10

## 2014-08-14 MED ORDER — SODIUM CHLORIDE 0.9 % IV SOLN
Freq: Once | INTRAVENOUS | Status: AC
  Administered 2014-08-14: 11:00:00 via INTRAVENOUS

## 2014-08-14 MED ORDER — DEXAMETHASONE SODIUM PHOSPHATE 20 MG/5ML IJ SOLN
20.0000 mg | Freq: Once | INTRAMUSCULAR | Status: AC
  Administered 2014-08-14: 20 mg via INTRAVENOUS

## 2014-08-14 MED ORDER — ONDANSETRON 16 MG/50ML IVPB (CHCC)
INTRAVENOUS | Status: AC
  Filled 2014-08-14: qty 16

## 2014-08-14 MED ORDER — SODIUM CHLORIDE 0.9 % IV SOLN
214.2400 mg | Freq: Once | INTRAVENOUS | Status: AC
  Administered 2014-08-14: 210 mg via INTRAVENOUS
  Filled 2014-08-14: qty 21

## 2014-08-14 MED ORDER — ONDANSETRON 16 MG/50ML IVPB (CHCC)
16.0000 mg | Freq: Once | INTRAVENOUS | Status: AC
  Administered 2014-08-14: 16 mg via INTRAVENOUS

## 2014-08-14 MED ORDER — HEPARIN SOD (PORK) LOCK FLUSH 100 UNIT/ML IV SOLN
500.0000 [IU] | Freq: Once | INTRAVENOUS | Status: AC | PRN
  Administered 2014-08-14: 500 [IU]
  Filled 2014-08-14: qty 5

## 2014-08-14 MED ORDER — DEXAMETHASONE SODIUM PHOSPHATE 20 MG/5ML IJ SOLN
INTRAMUSCULAR | Status: AC
  Filled 2014-08-14: qty 5

## 2014-08-14 MED ORDER — SODIUM CHLORIDE 0.9 % IJ SOLN
10.0000 mL | INTRAMUSCULAR | Status: DC | PRN
  Administered 2014-08-14: 10 mL
  Filled 2014-08-14: qty 10

## 2014-08-14 MED ORDER — DIPHENHYDRAMINE HCL 50 MG/ML IJ SOLN
INTRAMUSCULAR | Status: AC
  Filled 2014-08-14: qty 1

## 2014-08-14 MED ORDER — PACLITAXEL CHEMO INJECTION 300 MG/50ML
56.0000 mg/m2 | Freq: Once | INTRAVENOUS | Status: AC
  Administered 2014-08-14: 120 mg via INTRAVENOUS
  Filled 2014-08-14: qty 20

## 2014-08-14 MED ORDER — FAMOTIDINE IN NACL 20-0.9 MG/50ML-% IV SOLN
20.0000 mg | Freq: Once | INTRAVENOUS | Status: AC
  Administered 2014-08-14: 20 mg via INTRAVENOUS

## 2014-08-14 NOTE — Patient Instructions (Signed)
[facility] Discharge Instructions for Patients Receiving Chemotherapy  Today you received the following chemotherapy agents Taxol and Carboplatin.  To help prevent nausea and vomiting after your treatment, we encourage you to take your nausea medication.   If you develop nausea and vomiting that is not controlled by your nausea medication, call the clinic.   BELOW ARE SYMPTOMS THAT SHOULD BE REPORTED IMMEDIATELY:  *FEVER GREATER THAN 100.5 F  *CHILLS WITH OR WITHOUT FEVER  NAUSEA AND VOMITING THAT IS NOT CONTROLLED WITH YOUR NAUSEA MEDICATION  *UNUSUAL SHORTNESS OF BREATH  *UNUSUAL BRUISING OR BLEEDING  TENDERNESS IN MOUTH AND THROAT WITH OR WITHOUT PRESENCE OF ULCERS  *URINARY PROBLEMS  *BOWEL PROBLEMS  UNUSUAL RASH Items with * indicate a potential emergency and should be followed up as soon as possible.  Feel free to call the clinic you have any questions or concerns. The clinic phone number is [phone].    

## 2014-08-14 NOTE — Progress Notes (Signed)
Per Frederick Peers NP and Dr Jana Hakim; ok to treat with ANC 1.1. Patient will be scheduled for Neupogen on 12/01 and 12/02.

## 2014-08-14 NOTE — Addendum Note (Signed)
Addended by: Marcelino Duster on: 08/14/2014 02:20 PM   Modules accepted: Orders

## 2014-08-14 NOTE — Patient Instructions (Signed)

## 2014-08-14 NOTE — Progress Notes (Signed)
[place]  [phone] [phone]) [phone]     ID: [name] DOB: 1950/10/08  MR#: [id number]  XIP#:[id number]  PCP: Maximino Greenland, MD GYN: SU: Rolm Bookbinder OTHER MD: Gery Pray  CHIEF COMPLAINT: Stage III breast cancer, triple negative   CURRENT TREATMENT: Neoadjuvant chemotherapy  BREAST CANCER HISTORY: From the original intake note:  [name] herself palpated a mass in her left breast, and immediately brought it to the attention of her primary care physician, Dr. Baird Cancer, who confirmed the finding and setup the patient for bilateral diagnostic mammography at the breast Center 02/20/2014. This showed a high density mass in the outer left breast measuring up to 3.7 cm. It corresponded to the site of palpable concern. In addition the normal 4 logically abnormal lymph nodes in the left axilla. The mass was palpable by exam, and by ultrasonography measured 2.6 cm. There were enlarged and morphologically abnormal lymph nodes in the left axilla, largest measuring 3 cm.  Biopsy of the left breast mass in question and 1 of the abnormal axillary lymph nodes 02/22/2014 showed ([id number]) biopsies to be positive for an invasive ductal carcinoma, grade 3, triple negative, with an MIB-1 of 90%. Specifically the HER-2 signals ratio was 1.05, and the number per cell 2.00.  MRI of the breasts 03/01/2014 showed a 3.8 cm enhancing mass with areas of apparent necrosis in the left breast, as well as the biopsy tract extending laterally, the combined measure 5.3 cm. There were no other masses or areas of abnormal enhancement. There were multiple abnormally enlarged left axillary lymph nodes with loss of the normal fatty hilum. These included level I and retropectoral lymph nodes.  The patient's subsequent history is as detailed below   INTERVAL HISTORY: [name] returns today for follow up of her breast cancer. Today is day 1, cycle 12 of 12 planned weekly cycles of  paclitaxel and carboplatin.  [name] is ready to complete her last treatment today. She offers no new complaints.   REVIEW OF SYSTEMS: [name] denies fevers, chills, nausea, vomiting, or changes in bowel or bladder habits. She has no mouth sores, but her appetite is decreased this week, secondary to taste changes. She has no shortness of breath, chest pain, cough, or palpitations, or fatigue. The "softness" to her fingertips is stable and unchanged from previous weeks. A detailed review of systems is otherwise noncontributory.   PAST MEDICAL HISTORY: Past Medical History  Diagnosis Date  . Colon cancer   . Hx of rotator cuff surgery     PAST SURGICAL HISTORY: Past Surgical History  Procedure Laterality Date  . Abdominal hysterectomy    . Colon surgery      colectomy/colostomy with takedown    FAMILY HISTORY Family History  Problem Relation Age of Onset  . Cancer Mother 75    breast  . Cancer Maternal Aunt     breast cancer at unknown age   the patient's father died at the age of 82 following a stroke in the setting of diabetes; the patient's mother is living at 44. The patient has 3 brothers and 2 sisters. [name], [name], was diagnosed with breast cancer in her early 5s. There is no other breast or ovarian cancer in the family. The patient herself was diagnosed with watts by history he is an incidentally found stage I colon carcinoma noted at the time of her hysterectomy. She had a partial colectomy but no adjuvant treatment. We have no records regarding that procedure  GYNECOLOGIC HISTORY:  No LMP recorded. Patient has had a hysterectomy. Menarche age 76, first live birth age 36, the patient underwent total abdominal hysterectomy with bilateral salpingo-oophorectomy in her 66s. She did not take hormone replacement. She did not take oral contraceptives at any point.  SOCIAL HISTORY:  [name] work for [employer] for about 40 years, retiring in 2008. She is divorced and  lives alone, with no pets. Her daughter [name] works for [employer] in [employer]. The second daughter, [name], works as a Research scientist (physical sciences) for [employer]. The patient has 6 grandchildren and one great-grandchild. She attends a [employer].    ADVANCED DIRECTIVES: Not in place. The patient intends to name her daughter [name]. [name] is work number is [id number]. On the patient's 03/01/2014 visit she was given a copy of the appropriate documents to complete and notarize at her discretion   HEALTH MAINTENANCE: History  Substance Use Topics  . Smoking status: Never Smoker   . Smokeless tobacco: Not on file  . Alcohol Use: No     Colonoscopy: 2000  PAP: May 2015  Bone density: 05/23/2004, at [facility]; normal  Lipid panel:  No Known Allergies  Current Outpatient Prescriptions  Medication Sig Dispense Refill  . dexamethasone (DECADRON) 4 MG tablet Take 2 tablets by mouth once a day on the day after chemotherapy and then take 2 tablets two times a day for 2 days. Take with food. 30 tablet 1  . lidocaine-prilocaine (EMLA) cream Apply 1 application topically once. Apply to port 1-2 hours prior to access. 30 g 1  . loteprednol (LOTEMAX) 0.5 % ophthalmic suspension Place 1 drop into both eyes 3 (three) times daily.    . ondansetron (ZOFRAN) 8 MG tablet Take 1 tablet (8 mg total) by mouth 2 (two) times daily as needed. Start on the third day after chemotherapy. 30 tablet 0  . potassium chloride (K-DUR) 10 MEQ tablet Take 1 tablet (10 mEq total) by mouth 2 (two) times daily. 60 tablet 4  . tobramycin-dexamethasone (TOBRADEX) ophthalmic solution Place 1 drop into both eyes every 4 (four) hours while awake. 5 mL 0  . acetaminophen (TYLENOL) 500 MG tablet Take 1,000 mg by mouth every 6 (six) hours as needed for mild pain or moderate pain.    Marland Kitchen prochlorperazine (COMPAZINE) 10 MG tablet Take 1 tablet (10 mg total) by mouth every 6 (six) hours as  needed for nausea or vomiting. (Patient not taking: Reported on 08/14/2014) 30 tablet 0   No current facility-administered medications for this visit.    OBJECTIVE: Middle-aged [place] American woman who appears stated age 63 Vitals:   08/14/14 0936  BP: 130/87  Pulse: 115  Temp: 98.1 F (36.7 C)  Resp: 18     Body mass index is 43.76 kg/(m^2).    ECOG FS:1 - Symptomatic but completely ambulatory  Skin: warm, dry,  bilateral nail beds/palms hyperpigmented HEENT: sclerae anibilateral nail beds/palms hyperpigmentedcteric, conjunctivae pink, oropharynx clear. No thrush or mucositis.  Lymph Nodes: No cervical or supraclavicular lymphadenopathy  Lungs: clear to auscultation bilaterally, no rales, wheezes, or rhonci  Heart: regular rate and rhythm  Abdomen: round, soft, non tender, positive bowel sounds  Musculoskeletal: No focal spinal tenderness, no peripheral edema  Neuro: non focal, well oriented, positive affect  Breasts: right breast unremarkable. Left breast mass previously easily palpated in left upper quadrant, no longer able to be located. No skin or nipple changes. Left axilla benign.   LAB  RESULTS:  No results found for: SPEP  Lab Results  Component Value Date   WBC 2.0* 08/14/2014   NEUTROABS 1.1* 08/14/2014   HGB 10.0* 08/14/2014   HCT 29.6* 08/14/2014   MCV 93.1 08/14/2014   PLT 147 08/14/2014      Chemistry      Component Value Date/Time   NA 139 08/07/2014 0908   NA 143 04/24/2014 0958   K 3.5 08/07/2014 0908   K 3.5 04/24/2014 0958   CL 105 04/24/2014 0958   CO2 22 08/07/2014 0908   CO2 25 04/24/2014 0958   BUN 8.1 08/07/2014 0908   BUN 12 04/24/2014 0958   CREATININE 1.0 08/07/2014 0908   CREATININE 0.93 04/24/2014 0958      Component Value Date/Time   CALCIUM 9.6 08/07/2014 0908   CALCIUM 9.3 04/24/2014 0958   ALKPHOS 123 08/07/2014 0908   ALKPHOS 121* 04/24/2014 0958   AST 16 08/07/2014 0908   AST 13 04/24/2014 0958   ALT 26 08/07/2014  0908   ALT 25 04/24/2014 0958   BILITOT 0.70 08/07/2014 0908   BILITOT 0.3 04/24/2014 0958       No results found for: LABCA2  No components found for: [id number]  No results for input(s): INR in the last 168 hours.  Urinalysis    Component Value Date/Time   COLORURINE YELLOW 04/05/2008 1120   APPEARANCEUR CLEAR 04/05/2008 1120   LABSPEC 1.021 04/05/2008 1120   PHURINE 6.0 04/05/2008 1120   GLUCOSEU NEGATIVE 04/05/2008 1120   HGBUR NEGATIVE 04/05/2008 1120   BILIRUBINUR NEGATIVE 04/05/2008 1120   KETONESUR NEGATIVE 04/05/2008 1120   PROTEINUR NEGATIVE 04/05/2008 1120   UROBILINOGEN 0.2 04/05/2008 1120   NITRITE NEGATIVE 04/05/2008 1120   LEUKOCYTESUR  04/05/2008 1120    NEGATIVE MICROSCOPIC NOT DONE ON URINES WITH NEGATIVE PROTEIN, BLOOD, LEUKOCYTES, NITRITE, OR GLUCOSE <1000 mg/dL.    STUDIES: No results found.  ASSESSMENT: 63 y.o. [place] woman s/p breast and left axillary lymph node biopsy 02/22/2014, both positive for a clinical T2 N2, stage IIIA invasive ductal carcinoma, grade 3, triple negative, with an MIB-1 of 90%  (1) neoadjuvant chemotherapy started 03/13/2014, with cyclophosphamide and doxorubicin in dose dense fashion x4, with Neulasta support on day 2, completed 04/24/2014, to be followed by weekly carboplatin and paclitaxel x12, paclitaxel reduced during cycle 5 and cycle 7 because of elevated bilirubin levels  (2) definitive surgery to follow chemotherapy  (3) radiation to follow surgery  (4) remote history of partial colectomy for early stage colon cancer incidentally found during TAH/BSO in the 1980s  (5) genetics testing since 04/03/2014, 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 The labs were reviewed in detail and were  relatively stable. Her ANC is down to 1.1. In an effort to proceed with her final dose of paclitaxel and carboplatin today, we will have [name] return the next 3 days for neupogen injections.   [name] is scheduled for a repeat breast MRI on 12/8. She meets with Dr. [name] on 12/17 to discuss her upcoming surgery and is expected to have this procedure before the end of the year. She will return for labs and a follow up visit with Dr. Jana Hakim in early January. She understands and agrees with this plan. She knows the goal of treatment is cure.  She has been encouraged to call with any issues that might arise before her next visit here.   Marcelino Duster, NP   08/14/2014 10:07 AM

## 2014-08-15 ENCOUNTER — Ambulatory Visit (HOSPITAL_BASED_OUTPATIENT_CLINIC_OR_DEPARTMENT_OTHER): Payer: Medicare Other

## 2014-08-15 DIAGNOSIS — C773 Secondary and unspecified malignant neoplasm of axilla and upper limb lymph nodes: Secondary | ICD-10-CM | POA: Diagnosis not present

## 2014-08-15 DIAGNOSIS — D709 Neutropenia, unspecified: Secondary | ICD-10-CM

## 2014-08-15 DIAGNOSIS — C50812 Malignant neoplasm of overlapping sites of left female breast: Secondary | ICD-10-CM | POA: Diagnosis not present

## 2014-08-15 DIAGNOSIS — Z5189 Encounter for other specified aftercare: Secondary | ICD-10-CM

## 2014-08-15 MED ORDER — TBO-FILGRASTIM 480 MCG/0.8ML ~~LOC~~ SOSY
480.0000 ug | PREFILLED_SYRINGE | Freq: Once | SUBCUTANEOUS | Status: AC
  Administered 2014-08-15: 480 ug via SUBCUTANEOUS
  Filled 2014-08-15: qty 0.8

## 2014-08-15 NOTE — Patient Instructions (Signed)

## 2014-08-16 ENCOUNTER — Ambulatory Visit (HOSPITAL_BASED_OUTPATIENT_CLINIC_OR_DEPARTMENT_OTHER): Payer: Medicare Other

## 2014-08-16 DIAGNOSIS — Z5189 Encounter for other specified aftercare: Secondary | ICD-10-CM

## 2014-08-16 DIAGNOSIS — C773 Secondary and unspecified malignant neoplasm of axilla and upper limb lymph nodes: Secondary | ICD-10-CM | POA: Diagnosis not present

## 2014-08-16 DIAGNOSIS — C50812 Malignant neoplasm of overlapping sites of left female breast: Secondary | ICD-10-CM

## 2014-08-16 DIAGNOSIS — D709 Neutropenia, unspecified: Secondary | ICD-10-CM

## 2014-08-16 MED ORDER — TBO-FILGRASTIM 480 MCG/0.8ML ~~LOC~~ SOSY
480.0000 ug | PREFILLED_SYRINGE | Freq: Once | SUBCUTANEOUS | Status: AC
  Administered 2014-08-16: 480 ug via SUBCUTANEOUS
  Filled 2014-08-16: qty 0.8

## 2014-08-16 NOTE — Patient Instructions (Signed)

## 2014-08-17 ENCOUNTER — Ambulatory Visit (HOSPITAL_BASED_OUTPATIENT_CLINIC_OR_DEPARTMENT_OTHER): Payer: Medicare Other

## 2014-08-17 DIAGNOSIS — D709 Neutropenia, unspecified: Secondary | ICD-10-CM

## 2014-08-17 MED ORDER — TBO-FILGRASTIM 480 MCG/0.8ML ~~LOC~~ SOSY
480.0000 ug | PREFILLED_SYRINGE | Freq: Once | SUBCUTANEOUS | Status: AC
Start: 2014-08-17 — End: 2014-08-17
  Administered 2014-08-17: 480 ug via SUBCUTANEOUS
  Filled 2014-08-17: qty 0.8

## 2014-08-17 NOTE — Patient Instructions (Signed)

## 2014-08-22 ENCOUNTER — Ambulatory Visit (HOSPITAL_COMMUNITY)
Admission: RE | Admit: 2014-08-22 | Discharge: 2014-08-22 | Disposition: A | Discharge status: Home / Self Care | Payer: Medicare Other | Source: Ambulatory Visit | Attending: Nurse Practitioner | Admitting: Nurse Practitioner

## 2014-08-22 ENCOUNTER — Other Ambulatory Visit: Payer: Self-pay | Admitting: Nurse Practitioner

## 2014-08-22 DIAGNOSIS — C50412 Malignant neoplasm of upper-outer quadrant of left female breast: Secondary | ICD-10-CM | POA: Diagnosis not present

## 2014-08-22 DIAGNOSIS — Z01818 Encounter for other preprocedural examination: Secondary | ICD-10-CM | POA: Diagnosis not present

## 2014-08-22 MED ORDER — GADOBENATE DIMEGLUMINE 529 MG/ML IV SOLN
20.0000 mL | Freq: Once | INTRAVENOUS | Status: AC | PRN
  Administered 2014-08-22: 20 mL via INTRAVENOUS

## 2014-08-31 ENCOUNTER — Encounter: Payer: Self-pay | Admitting: *Deleted

## 2014-08-31 DIAGNOSIS — C50912 Malignant neoplasm of unspecified site of left female breast: Secondary | ICD-10-CM | POA: Diagnosis not present

## 2014-09-01 ENCOUNTER — Other Ambulatory Visit (INDEPENDENT_AMBULATORY_CARE_PROVIDER_SITE_OTHER): Payer: Self-pay | Admitting: General Surgery

## 2014-09-01 DIAGNOSIS — C50412 Malignant neoplasm of upper-outer quadrant of left female breast: Secondary | ICD-10-CM

## 2014-09-01 DIAGNOSIS — C50912 Malignant neoplasm of unspecified site of left female breast: Secondary | ICD-10-CM

## 2014-09-05 ENCOUNTER — Other Ambulatory Visit (INDEPENDENT_AMBULATORY_CARE_PROVIDER_SITE_OTHER): Payer: Self-pay | Admitting: General Surgery

## 2014-09-06 ENCOUNTER — Telehealth (INDEPENDENT_AMBULATORY_CARE_PROVIDER_SITE_OTHER): Payer: Self-pay

## 2014-09-06 DIAGNOSIS — C50912 Malignant neoplasm of unspecified site of left female breast: Secondary | ICD-10-CM

## 2014-09-07 NOTE — Telephone Encounter (Signed)
Pt needs to have dg bilateral mgm and left br u/s in order to get the [facility] Study after surgery per Dr [name].

## 2014-09-12 ENCOUNTER — Ambulatory Visit
Admission: RE | Admit: 2014-09-12 | Discharge: 2014-09-12 | Disposition: A | Discharge status: Home / Self Care | Payer: Medicare Other | Source: Ambulatory Visit | Attending: Surgery | Admitting: Surgery

## 2014-09-12 ENCOUNTER — Telehealth: Payer: Self-pay | Admitting: *Deleted

## 2014-09-12 DIAGNOSIS — C50912 Malignant neoplasm of unspecified site of left female breast: Secondary | ICD-10-CM

## 2014-09-12 NOTE — Telephone Encounter (Signed)
Left message on patient's home and mobile number to reschedule her 09/18/14 appointment with Dr. Jana Hakim until after her surgery on 09/20/14.  Awaiting patient response.

## 2014-09-13 ENCOUNTER — Telehealth: Payer: Self-pay | Admitting: *Deleted

## 2014-09-13 NOTE — Telephone Encounter (Signed)
Received call back from patient and rescheduled and confirmed appointment with Dr. Jana Hakim for 10/27/14 at 1130am for lab and then 12N for Dr. Jana Hakim. Encouraged patient to call with any needs or concerns.

## 2014-09-14 ENCOUNTER — Other Ambulatory Visit: Payer: Self-pay | Admitting: Oncology

## 2014-09-14 ENCOUNTER — Encounter (HOSPITAL_BASED_OUTPATIENT_CLINIC_OR_DEPARTMENT_OTHER): Payer: Self-pay | Admitting: *Deleted

## 2014-09-14 NOTE — Progress Notes (Signed)
To come in for labs after seeds 1/5 For snbx or possible axillary node dissect-will bring overnight bag in case

## 2014-09-15 DIAGNOSIS — Z9221 Personal history of antineoplastic chemotherapy: Secondary | ICD-10-CM

## 2014-09-15 DIAGNOSIS — Z923 Personal history of irradiation: Secondary | ICD-10-CM

## 2014-09-15 HISTORY — DX: Personal history of antineoplastic chemotherapy: Z92.21

## 2014-09-15 HISTORY — PX: BREAST LUMPECTOMY: SHX2

## 2014-09-15 HISTORY — DX: Personal history of irradiation: Z92.3

## 2014-09-17 ENCOUNTER — Other Ambulatory Visit: Payer: Self-pay | Admitting: Oncology

## 2014-09-17 NOTE — Progress Notes (Unsigned)
[facility]  [phone] [phone]     ID: [name] DOB: 09/18/1950  MR#: [id number]  MGQ#:[id number]  PCP: Maximino Greenland, MD GYN: SU: Rolm Bookbinder OTHER MD: Gery Pray  CHIEF COMPLAINT: Stage III breast cancer, triple negative   CURRENT TREATMENT: Neoadjuvant chemotherapy  BREAST CANCER HISTORY: From the original intake note:  [name] herself palpated a mass in her left breast, and immediately brought it to the attention of her primary care physician, Dr. Baird Cancer, who confirmed the finding and setup the patient for bilateral diagnostic mammography at the breast Center 02/20/2014. This showed a high density mass in the outer left breast measuring up to 3.7 cm. It corresponded to the site of palpable concern. In addition the normal 4 logically abnormal lymph nodes in the left axilla. The mass was palpable by exam, and by ultrasonography measured 2.6 cm. There were enlarged and morphologically abnormal lymph nodes in the left axilla, largest measuring 3 cm.  Biopsy of the left breast mass in question and 1 of the abnormal axillary lymph nodes 02/22/2014 showed ([id number]) biopsies to be positive for an invasive ductal carcinoma, grade 3, triple negative, with an MIB-1 of 90%. Specifically the HER-2 signals ratio was 1.05, and the number per cell 2.00.  MRI of the breasts 03/01/2014 showed a 3.8 cm enhancing mass with areas of apparent necrosis in the left breast, as well as the biopsy tract extending laterally, the combined measure 5.3 cm. There were no other masses or areas of abnormal enhancement. There were multiple abnormally enlarged left axillary lymph nodes with loss of the normal fatty hilum. These included level I and retropectoral lymph nodes.  The patient's subsequent history is as detailed below   INTERVAL HISTORY: [name] returns today for follow up of her breast cancer. Today is day 1, cycle 12 of 12 planned weekly cycles of  paclitaxel and carboplatin.  [name] is ready to complete her last treatment today. She offers no new complaints.   REVIEW OF SYSTEMS: [name] denies fevers, chills, nausea, vomiting, or changes in bowel or bladder habits. She has no mouth sores, but her appetite is decreased this week, secondary to taste changes. She has no shortness of breath, chest pain, cough, or palpitations, or fatigue. The "softness" to her fingertips is stable and unchanged from previous weeks. A detailed review of systems is otherwise noncontributory.   PAST MEDICAL HISTORY: Past Medical History  Diagnosis Date  . Hx of rotator cuff surgery   . Colon cancer   . Breast cancer     PAST SURGICAL HISTORY: Past Surgical History  Procedure Laterality Date  . Abdominal hysterectomy  1988  . Colon surgery  1988    colectomy/colostomy-ca  . Colon surgery  1988    colostomy takedown-reversal  . Colonoscopy    . Total shoulder arthroplasty  2009    right    FAMILY HISTORY Family History  Problem Relation Age of Onset  . Cancer Mother 58    breast  . Cancer Maternal Aunt     breast cancer at unknown age   the patient's father died at the age of 64 following a stroke in the setting of diabetes; the patient's mother is living at 70. The patient has 3 brothers and 2 sisters. [name], [name], was diagnosed with breast cancer in her early 98s. There is no other breast or ovarian cancer in the family. The patient herself was diagnosed with watts by history he is an  incidentally found stage I colon carcinoma noted at the time of her hysterectomy. She had a partial colectomy but no adjuvant treatment. We have no records regarding that procedure  GYNECOLOGIC HISTORY:  No LMP recorded. Patient has had a hysterectomy. Menarche age 28, first live birth age 1, the patient underwent total abdominal hysterectomy with bilateral salpingo-oophorectomy in her 28s. She did not take hormone replacement. She did not take  oral contraceptives at any point.  SOCIAL HISTORY:  [name] work for [employer] for about 40 years, retiring in 2008. She is divorced and lives alone, with no pets. Her daughter [name] works for [employer] in Press [employer]. The second daughter, [name], works as a Research scientist (physical sciences) for [employer]. The patient has 6 grandchildren and one great-grandchild. She attends a [employer].    ADVANCED DIRECTIVES: Not in place. The patient intends to name her daughter [name]. [name] is work number is [id number]. On the patient's 03/01/2014 visit she was given a copy of the appropriate documents to complete and notarize at her discretion   HEALTH MAINTENANCE: History  Substance Use Topics  . Smoking status: Never Smoker   . Smokeless tobacco: Not on file  . Alcohol Use: Yes     Comment: occ-rare     Colonoscopy: 2000  PAP: May 2015  Bone density: 05/23/2004, at [facility] hospital; normal  Lipid panel:  No Known Allergies  Current Outpatient Prescriptions  Medication Sig Dispense Refill  . acetaminophen (TYLENOL) 500 MG tablet Take 1,000 mg by mouth every 6 (six) hours as needed for mild pain or moderate pain.    Marland Kitchen dexamethasone (DECADRON) 4 MG tablet Take 2 tablets by mouth once a day on the day after chemotherapy and then take 2 tablets two times a day for 2 days. Take with food. 30 tablet 1  . lidocaine-prilocaine (EMLA) cream Apply 1 application topically once. Apply to port 1-2 hours prior to access. 30 g 1  . loteprednol (LOTEMAX) 0.5 % ophthalmic suspension Place 1 drop into both eyes 3 (three) times daily.    . ondansetron (ZOFRAN) 8 MG tablet Take 1 tablet (8 mg total) by mouth 2 (two) times daily as needed. Start on the third day after chemotherapy. 30 tablet 0  . potassium chloride (K-DUR) 10 MEQ tablet Take 1 tablet (10 mEq total) by mouth 2 (two) times daily. 60 tablet 4  . prochlorperazine (COMPAZINE) 10 MG tablet Take 1 tablet (10 mg total)  by mouth every 6 (six) hours as needed for nausea or vomiting. (Patient not taking: Reported on 08/14/2014) 30 tablet 0  . tobramycin-dexamethasone (TOBRADEX) ophthalmic solution Place 1 drop into both eyes every 4 (four) hours while awake. 5 mL 0   No current facility-administered medications for this visit.    OBJECTIVE: Middle-aged [place] American woman who appears stated age There were no vitals filed for this visit.   There is no weight on file to calculate BMI.    ECOG FS:1 - Symptomatic but completely ambulatory  Skin: warm, dry,  bilateral nail beds/palms hyperpigmented HEENT: sclerae anibilateral nail beds/palms hyperpigmentedcteric, conjunctivae pink, oropharynx clear. No thrush or mucositis.  Lymph Nodes: No cervical or supraclavicular lymphadenopathy  Lungs: clear to auscultation bilaterally, no rales, wheezes, or rhonci  Heart: regular rate and rhythm  Abdomen: round, soft, non tender, positive bowel sounds  Musculoskeletal: No focal spinal tenderness, no peripheral edema  Neuro: non focal, well oriented, positive affect  Breasts: right breast unremarkable. Left breast mass  previously easily palpated in left upper quadrant, no longer able to be located. No skin or nipple changes. Left axilla benign.   LAB RESULTS:  No results found for: SPEP  Lab Results  Component Value Date   WBC 2.0* 08/14/2014   NEUTROABS 1.1* 08/14/2014   HGB 10.0* 08/14/2014   HCT 29.6* 08/14/2014   MCV 93.1 08/14/2014   PLT 147 08/14/2014      Chemistry      Component Value Date/Time   NA 140 08/14/2014 0906   NA 143 04/24/2014 0958   K 3.5 08/14/2014 0906   K 3.5 04/24/2014 0958   CL 105 04/24/2014 0958   CO2 25 08/14/2014 0906   CO2 25 04/24/2014 0958   BUN 5.0* 08/14/2014 0906   BUN 12 04/24/2014 0958   CREATININE 0.8 08/14/2014 0906   CREATININE 0.93 04/24/2014 0958      Component Value Date/Time   CALCIUM 9.7 08/14/2014 0906   CALCIUM 9.3 04/24/2014 0958   ALKPHOS 101  08/14/2014 0906   ALKPHOS 121* 04/24/2014 0958   AST 18 08/14/2014 0906   AST 13 04/24/2014 0958   ALT 27 08/14/2014 0906   ALT 25 04/24/2014 0958   BILITOT 0.99 08/14/2014 0906   BILITOT 0.3 04/24/2014 0958       No results found for: LABCA2  No components found for: [id number]  No results for input(s): INR in the last 168 hours.  Urinalysis    Component Value Date/Time   COLORURINE YELLOW 04/05/2008 1120   APPEARANCEUR CLEAR 04/05/2008 1120   LABSPEC 1.021 04/05/2008 1120   PHURINE 6.0 04/05/2008 1120   GLUCOSEU NEGATIVE 04/05/2008 1120   HGBUR NEGATIVE 04/05/2008 1120   BILIRUBINUR NEGATIVE 04/05/2008 1120   KETONESUR NEGATIVE 04/05/2008 1120   PROTEINUR NEGATIVE 04/05/2008 1120   UROBILINOGEN 0.2 04/05/2008 1120   NITRITE NEGATIVE 04/05/2008 1120   LEUKOCYTESUR  04/05/2008 1120    NEGATIVE MICROSCOPIC NOT DONE ON URINES WITH NEGATIVE PROTEIN, BLOOD, LEUKOCYTES, NITRITE, OR GLUCOSE <1000 mg/dL.    STUDIES: Mr Breast Bilateral W Wo Contrast  08/22/2014   CLINICAL DATA:  Breast cancer of the upper-outer quadrant of the left breast. Status post neoadjuvant treatment. Preoperative evaluation.  LABS:  None applicable  EXAM: BILATERAL BREAST MRI WITH AND WITHOUT CONTRAST  TECHNIQUE: Multiplanar, multisequence MR images of both breasts were obtained prior to and following the intravenous administration of 80m of MultiHance.  THREE-DIMENSIONAL MR IMAGE RENDERING ON INDEPENDENT WORKSTATION:  Three-dimensional MR images were rendered by post-processing of the original MR data on an independent workstation. The three-dimensional MR images were interpreted, and findings are reported in the following complete MRI report for this study. Three dimensional images were evaluated at the independent DynaCad workstation  COMPARISON:  MRI 03/01/2014  FINDINGS: Breast composition: b.  Scattered fibroglandular tissue.  Background parenchymal enhancement: Minimal. There are rare foci of enhancement  bilaterally.  Right breast: No mass or abnormal enhancement. Right-sided Port-A-Cath is visible.  Left breast: The mass previously noted in the upper central portion of the left breast measuring 3.8 cm is no longer apparent. The clip placed at the time of biopsy is difficult to visualize today, possibly related to technique. There are rare central foci of enhancement in the central portion of the left breast, symmetric compared to the right and not suspicious.  Lymph nodes: There has been significant improvement in the enlarged left axillary lymph nodes. Largest lower left axillary lymph node now measures 0.8 x 1.2 cm.  Ancillary findings: Patient  has had right shoulder arthroplasty. There is atrophy of the right pectoralis minor.  IMPRESSION: Significant improvement in the large enhancing left breast mass and enlarged left axillary lymph nodes.  No new abnormalities identified.  RECOMMENDATION: Treatment plan is recommended.  BI-RADS CATEGORY  6: Known biopsy-proven malignancy.   Electronically Signed   By: Shon Hale M.D.   On: 08/22/2014 14:42   Mm Digital Diagnostic Bilat  09/12/2014   CLINICAL DATA:  Patient with known left breast carcinoma, recently treated with neoadjuvant chemotherapy. This is to be assess the left breast mass.  EXAM: DIGITAL DIAGNOSTIC  BILATERAL MAMMOGRAM WITH CAD  ULTRASOUND LEFT BREAST  COMPARISON:  Prior exams, most recent dated 02/22/2014  ACR Breast Density Category b: There are scattered areas of fibroglandular density.  FINDINGS: The left breast post biopsy hematoma has resolved. The left breast mass is smaller than it was on the study dated 03/07/2013. In the place of the mass there is ill-defined density surrounding biopsy clip.  No new breast masses. No areas of architectural distortion. There are no suspicious calcifications. No other changes.  Mammographic images were processed with CAD.  Ultrasound is performed, showing a faint mildly hypoechoic oval mass measuring 9.3 mm  x 5.3 mm x 12 mm. Previously this mass measured 2.6 cm x 2.5 cm x 2.6 cm.  IMPRESSION: Marked response to chemotherapy with significant reduction in the size of the left breast carcinoma. No new abnormalities.  RECOMMENDATION: Continued planned treatment for the known left breast carcinoma. Annual surveillance mammography for the right breast.  I have discussed the findings and recommendations with the patient. Results were also provided in writing at the conclusion of the visit. If applicable, a reminder letter will be sent to the patient regarding the next appointment.  BI-RADS CATEGORY  6: Known biopsy-proven malignancy.   Electronically Signed   By: Lajean Manes M.D.   On: 09/12/2014 11:42   US Breast Ltd Uni Left Inc Axilla  09/12/2014   CLINICAL DATA:  Patient with known left breast carcinoma, recently treated with neoadjuvant chemotherapy. This is to be assess the left breast mass.  EXAM: DIGITAL DIAGNOSTIC  BILATERAL MAMMOGRAM WITH CAD  ULTRASOUND LEFT BREAST  COMPARISON:  Prior exams, most recent dated 02/22/2014  ACR Breast Density Category b: There are scattered areas of fibroglandular density.  FINDINGS: The left breast post biopsy hematoma has resolved. The left breast mass is smaller than it was on the study dated 03/07/2013. In the place of the mass there is ill-defined density surrounding biopsy clip.  No new breast masses. No areas of architectural distortion. There are no suspicious calcifications. No other changes.  Mammographic images were processed with CAD.  Ultrasound is performed, showing a faint mildly hypoechoic oval mass measuring 9.3 mm x 5.3 mm x 12 mm. Previously this mass measured 2.6 cm x 2.5 cm x 2.6 cm.  IMPRESSION: Marked response to chemotherapy with significant reduction in the size of the left breast carcinoma. No new abnormalities.  RECOMMENDATION: Continued planned treatment for the known left breast carcinoma. Annual surveillance mammography for the right breast.  I have  discussed the findings and recommendations with the patient. Results were also provided in writing at the conclusion of the visit. If applicable, a reminder letter will be sent to the patient regarding the next appointment.  BI-RADS CATEGORY  6: Known biopsy-proven malignancy.   Electronically Signed   By: Lajean Manes M.D.   On: 09/12/2014 11:42    ASSESSMENT: 64 y.o.  [place] woman s/p breast and left axillary lymph node biopsy 02/22/2014, both positive for a clinical T2 N1*, stage IIB invasive ductal carcinoma, grade 3, triple negative, with an MIB-1 of 90%  (1) neoadjuvant chemotherapy started 03/13/2014, with cyclophosphamide and doxorubicin in dose dense fashion x4, with Neulasta support on day 2, completed 04/24/2014, to be followed by weekly carboplatin and paclitaxel x12, paclitaxel reduced during cycle 5 and cycle 7 because of elevated bilirubin levels  (2) definitive surgery to follow chemotherapy  (3) radiation to follow surgery  (4) remote history of partial colectomy for early stage colon cancer incidentally found during TAH/BSO in the 1980s  (5) genetics testing since 04/03/2014, 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 The labs were reviewed in detail and were relatively stable. Her ANC is down to 1.1. In an effort to proceed with her final dose of paclitaxel and carboplatin today, we will have [name] return the next 3 days for neupogen injections.   [name] is scheduled for a repeat breast MRI on 12/8. She meets with Dr. [name] on 12/17 to discuss her upcoming surgery and is expected to have this procedure before the end of the year. She will return for labs and a follow up visit with Dr. Jana Hakim in early January. She understands and agrees with this  plan. She knows the goal of treatment is cure. She has been encouraged to call with any issues that might arise before her next visit here.   Ruth Cruel, MD   09/17/2014 3:46 PM   *ADDENDUM: I have reviewed the original staging studies from June 2015 and on review the clinical stage is consistent wit a T2, N1, stage IIB invasive ductal carcinoma-- I have made the appropriate changes above

## 2014-09-18 ENCOUNTER — Ambulatory Visit: Payer: Medicare Other | Admitting: Oncology

## 2014-09-18 ENCOUNTER — Other Ambulatory Visit: Payer: Medicare Other

## 2014-09-19 ENCOUNTER — Encounter (HOSPITAL_BASED_OUTPATIENT_CLINIC_OR_DEPARTMENT_OTHER)
Admission: RE | Admit: 2014-09-19 | Discharge: 2014-09-19 | Disposition: A | Discharge status: Home / Self Care | Payer: Medicare Other | Source: Ambulatory Visit | Attending: General Surgery | Admitting: General Surgery

## 2014-09-19 ENCOUNTER — Ambulatory Visit
Admission: RE | Admit: 2014-09-19 | Discharge: 2014-09-19 | Disposition: A | Discharge status: Home / Self Care | Payer: Medicare Other | Source: Ambulatory Visit | Attending: General Surgery | Admitting: General Surgery

## 2014-09-19 DIAGNOSIS — Z85038 Personal history of other malignant neoplasm of large intestine: Secondary | ICD-10-CM | POA: Diagnosis not present

## 2014-09-19 DIAGNOSIS — C50912 Malignant neoplasm of unspecified site of left female breast: Secondary | ICD-10-CM | POA: Diagnosis not present

## 2014-09-19 LAB — BASIC METABOLIC PANEL
ANION GAP: 9 (ref 5–15)
CO2: 24 mmol/L (ref 19–32)
Calcium: 9.7 mg/dL (ref 8.4–10.5)
Chloride: 107 mEq/L (ref 96–112)
Creatinine, Ser: 0.81 mg/dL (ref 0.50–1.10)
GFR calc Af Amer: 88 mL/min — ABNORMAL LOW (ref >90)
GFR, EST NON AFRICAN AMERICAN: 76 mL/min — AB (ref >90)
Glucose, Bld: 93 mg/dL (ref 70–99)
Potassium: 3.7 mmol/L (ref 3.5–5.1)
Sodium: 140 mmol/L (ref 135–145)

## 2014-09-19 LAB — CBC WITH DIFFERENTIAL/PLATELET
BASOS ABS: 0 10*3/uL (ref 0.0–0.1)
BASOS PCT: 1 % (ref 0–1)
EOS ABS: 0.1 10*3/uL (ref 0.0–0.7)
EOS PCT: 2 % (ref 0–5)
HEMATOCRIT: 34.5 % — AB (ref 36.0–46.0)
Hemoglobin: 11.1 g/dL — ABNORMAL LOW (ref 12.0–15.0)
Lymphocytes Relative: 24 % (ref 12–46)
Lymphs Abs: 1 10*3/uL (ref 0.7–4.0)
MCH: 30 pg (ref 26.0–34.0)
MCHC: 32.2 g/dL (ref 30.0–36.0)
MCV: 93.2 fL (ref 78.0–100.0)
Monocytes Absolute: 0.4 10*3/uL (ref 0.1–1.0)
Monocytes Relative: 10 % (ref 3–12)
NEUTROS ABS: 2.7 10*3/uL (ref 1.7–7.7)
NEUTROS PCT: 63 % (ref 43–77)
Platelets: 279 10*3/uL (ref 150–400)
RBC: 3.7 MIL/uL — AB (ref 3.87–5.11)
RDW: 15 % (ref 11.5–15.5)
WBC: 4.2 10*3/uL (ref 4.0–10.5)

## 2014-09-20 ENCOUNTER — Encounter (HOSPITAL_BASED_OUTPATIENT_CLINIC_OR_DEPARTMENT_OTHER): Payer: Self-pay

## 2014-09-20 ENCOUNTER — Encounter (HOSPITAL_BASED_OUTPATIENT_CLINIC_OR_DEPARTMENT_OTHER)
Admission: RE | Disposition: A | Discharge status: Home / Self Care | Payer: Self-pay | Source: Ambulatory Visit | Attending: General Surgery

## 2014-09-20 ENCOUNTER — Ambulatory Visit
Admission: RE | Admit: 2014-09-20 | Discharge: 2014-09-20 | Disposition: A | Discharge status: Home / Self Care | Payer: Medicare Other | Source: Ambulatory Visit | Attending: General Surgery | Admitting: General Surgery

## 2014-09-20 ENCOUNTER — Encounter (HOSPITAL_COMMUNITY)
Admission: RE | Admit: 2014-09-20 | Discharge: 2014-09-20 | Disposition: A | Discharge status: Home / Self Care | Payer: Medicare Other | Source: Ambulatory Visit | Attending: General Surgery | Admitting: General Surgery

## 2014-09-20 ENCOUNTER — Ambulatory Visit (HOSPITAL_BASED_OUTPATIENT_CLINIC_OR_DEPARTMENT_OTHER)
Admission: RE | Admit: 2014-09-20 | Discharge: 2014-09-20 | Disposition: A | Discharge status: Home / Self Care | Payer: Medicare Other | Source: Ambulatory Visit | Attending: General Surgery | Admitting: General Surgery

## 2014-09-20 ENCOUNTER — Ambulatory Visit (HOSPITAL_BASED_OUTPATIENT_CLINIC_OR_DEPARTMENT_OTHER): Payer: Medicare Other | Admitting: Certified Registered"

## 2014-09-20 DIAGNOSIS — N6012 Diffuse cystic mastopathy of left breast: Secondary | ICD-10-CM | POA: Diagnosis not present

## 2014-09-20 DIAGNOSIS — C50912 Malignant neoplasm of unspecified site of left female breast: Secondary | ICD-10-CM

## 2014-09-20 DIAGNOSIS — C50412 Malignant neoplasm of upper-outer quadrant of left female breast: Secondary | ICD-10-CM

## 2014-09-20 DIAGNOSIS — Z85038 Personal history of other malignant neoplasm of large intestine: Secondary | ICD-10-CM | POA: Diagnosis not present

## 2014-09-20 DIAGNOSIS — R079 Chest pain, unspecified: Secondary | ICD-10-CM | POA: Diagnosis not present

## 2014-09-20 DIAGNOSIS — G8918 Other acute postprocedural pain: Secondary | ICD-10-CM | POA: Diagnosis not present

## 2014-09-20 HISTORY — PX: RADIOACTIVE SEED GUIDED PARTIAL MASTECTOMY WITH AXILLARY SENTINEL LYMPH NODE BIOPSY: SHX6520

## 2014-09-20 HISTORY — DX: Malignant neoplasm of unspecified site of unspecified female breast: C50.919

## 2014-09-20 HISTORY — PX: EXCISION / BIOPSY BREAST / NIPPLE / DUCT: SUR469

## 2014-09-20 SURGERY — RADIOACTIVE SEED GUIDED PARTIAL MASTECTOMY WITH AXILLARY SENTINEL LYMPH NODE BIOPSY
Anesthesia: Regional | Site: Breast | Laterality: Left

## 2014-09-20 MED ORDER — LACTATED RINGERS IV SOLN
INTRAVENOUS | Status: DC
  Administered 2014-09-20: 08:00:00 via INTRAVENOUS

## 2014-09-20 MED ORDER — MIDAZOLAM HCL 2 MG/2ML IJ SOLN
INTRAMUSCULAR | Status: AC
  Filled 2014-09-20: qty 2

## 2014-09-20 MED ORDER — FENTANYL CITRATE 0.05 MG/ML IJ SOLN
INTRAMUSCULAR | Status: DC | PRN
  Administered 2014-09-20: 25 ug via INTRAVENOUS

## 2014-09-20 MED ORDER — LIDOCAINE HCL (CARDIAC) 20 MG/ML IV SOLN
INTRAVENOUS | Status: DC | PRN
  Administered 2014-09-20: 75 mg via INTRAVENOUS

## 2014-09-20 MED ORDER — DEXAMETHASONE SODIUM PHOSPHATE 4 MG/ML IJ SOLN
INTRAMUSCULAR | Status: DC | PRN
  Administered 2014-09-20: 10 mg via INTRAVENOUS

## 2014-09-20 MED ORDER — OXYCODONE-ACETAMINOPHEN 5-325 MG PO TABS
2.0000 | ORAL_TABLET | Freq: Once | ORAL | Status: AC
  Administered 2014-09-20: 2 via ORAL

## 2014-09-20 MED ORDER — FENTANYL CITRATE 0.05 MG/ML IJ SOLN
INTRAMUSCULAR | Status: AC
  Filled 2014-09-20: qty 6

## 2014-09-20 MED ORDER — CEFAZOLIN SODIUM-DEXTROSE 2-3 GM-% IV SOLR
2.0000 g | INTRAVENOUS | Status: AC
  Administered 2014-09-20: 2 g via INTRAVENOUS

## 2014-09-20 MED ORDER — SODIUM CHLORIDE 0.9 % IJ SOLN
INTRAMUSCULAR | Status: AC
  Filled 2014-09-20: qty 10

## 2014-09-20 MED ORDER — PROPOFOL 10 MG/ML IV BOLUS
INTRAVENOUS | Status: DC | PRN
  Administered 2014-09-20: 150 mg via INTRAVENOUS

## 2014-09-20 MED ORDER — HYDROMORPHONE HCL 1 MG/ML IJ SOLN: 0.2500 mg | INTRAMUSCULAR | Status: DC | PRN

## 2014-09-20 MED ORDER — OXYCODONE-ACETAMINOPHEN 5-325 MG PO TABS
ORAL_TABLET | ORAL | Status: AC
  Filled 2014-09-20: qty 2

## 2014-09-20 MED ORDER — ONDANSETRON HCL 4 MG/2ML IJ SOLN
4.0000 mg | Freq: Four times a day (QID) | INTRAMUSCULAR | Status: AC | PRN
  Administered 2014-09-20: 4 mg via INTRAVENOUS

## 2014-09-20 MED ORDER — METHYLENE BLUE 1 % INJ SOLN
INTRAMUSCULAR | Status: AC
  Filled 2014-09-20: qty 10

## 2014-09-20 MED ORDER — BUPIVACAINE-EPINEPHRINE (PF) 0.5% -1:200000 IJ SOLN
INTRAMUSCULAR | Status: DC | PRN
  Administered 2014-09-20: 25 mL via PERINEURAL

## 2014-09-20 MED ORDER — ONDANSETRON HCL 4 MG/2ML IJ SOLN
INTRAMUSCULAR | Status: AC
  Filled 2014-09-20: qty 2

## 2014-09-20 MED ORDER — CEFAZOLIN SODIUM 1-5 GM-% IV SOLN
INTRAVENOUS | Status: AC
  Filled 2014-09-20: qty 100

## 2014-09-20 MED ORDER — MIDAZOLAM HCL 2 MG/2ML IJ SOLN
1.0000 mg | INTRAMUSCULAR | Status: DC | PRN
  Administered 2014-09-20 (×2): 1 mg via INTRAVENOUS

## 2014-09-20 MED ORDER — SODIUM CHLORIDE 0.9 % IJ SOLN
INTRAMUSCULAR | Status: DC | PRN
  Administered 2014-09-20: 5 mL via INTRADERMAL

## 2014-09-20 MED ORDER — LACTATED RINGERS IV SOLN
INTRAVENOUS | Status: DC | PRN
  Administered 2014-09-20 (×2): via INTRAVENOUS

## 2014-09-20 MED ORDER — PROPOFOL 10 MG/ML IV BOLUS
INTRAVENOUS | Status: AC
  Filled 2014-09-20: qty 20

## 2014-09-20 MED ORDER — TECHNETIUM TC 99M SULFUR COLLOID FILTERED
1.0000 | Freq: Once | INTRAVENOUS | Status: AC | PRN
  Administered 2014-09-20: 1 via INTRADERMAL

## 2014-09-20 MED ORDER — OXYCODONE-ACETAMINOPHEN 10-325 MG PO TABS: 1.0000 | ORAL_TABLET | Freq: Four times a day (QID) | ORAL | Status: DC | PRN

## 2014-09-20 MED ORDER — FENTANYL CITRATE 0.05 MG/ML IJ SOLN
INTRAMUSCULAR | Status: AC
  Filled 2014-09-20: qty 2

## 2014-09-20 MED ORDER — ONDANSETRON HCL 4 MG/2ML IJ SOLN
INTRAMUSCULAR | Status: DC | PRN
  Administered 2014-09-20: 4 mg via INTRAVENOUS

## 2014-09-20 MED ORDER — FENTANYL CITRATE 0.05 MG/ML IJ SOLN
50.0000 ug | INTRAMUSCULAR | Status: DC | PRN
  Administered 2014-09-20 (×2): 50 ug via INTRAVENOUS

## 2014-09-20 MED ORDER — BUPIVACAINE HCL (PF) 0.25 % IJ SOLN
INTRAMUSCULAR | Status: AC
  Filled 2014-09-20: qty 30

## 2014-09-20 SURGICAL SUPPLY — 63 items
APL SKNCLS STERI-STRIP NONHPOA (GAUZE/BANDAGES/DRESSINGS)
APPLIER CLIP 9.375 MED OPEN (MISCELLANEOUS) ×2
APR CLP MED 9.3 20 MLT OPN (MISCELLANEOUS) ×1
BENZOIN TINCTURE PRP APPL 2/3 (GAUZE/BANDAGES/DRESSINGS) IMPLANT
BINDER BREAST LRG (GAUZE/BANDAGES/DRESSINGS) IMPLANT
BINDER BREAST MEDIUM (GAUZE/BANDAGES/DRESSINGS) IMPLANT
BINDER BREAST XLRG (GAUZE/BANDAGES/DRESSINGS) IMPLANT
BINDER BREAST XXLRG (GAUZE/BANDAGES/DRESSINGS) IMPLANT
BLADE SURG 15 STRL LF DISP TIS (BLADE) ×1 IMPLANT
BLADE SURG 15 STRL SS (BLADE) ×2
CANISTER SUC SOCK COL 7IN (MISCELLANEOUS) IMPLANT
CANISTER SUCT 1200ML W/VALVE (MISCELLANEOUS) IMPLANT
CHLORAPREP W/TINT 26ML (MISCELLANEOUS) ×2 IMPLANT
CLIP APPLIE 9.375 MED OPEN (MISCELLANEOUS) ×1 IMPLANT
COVER BACK TABLE 60X90IN (DRAPES) ×2 IMPLANT
COVER MAYO STAND STRL (DRAPES) ×2 IMPLANT
COVER PROBE W GEL 5X96 (DRAPES) ×2 IMPLANT
DECANTER SPIKE VIAL GLASS SM (MISCELLANEOUS) IMPLANT
DEVICE DUBIN W/COMP PLATE 8390 (MISCELLANEOUS) ×2 IMPLANT
DRAPE LAPAROSCOPIC ABDOMINAL (DRAPES) ×2 IMPLANT
DRSG TEGADERM 4X4.75 (GAUZE/BANDAGES/DRESSINGS) IMPLANT
ELECT COATED BLADE 2.86 ST (ELECTRODE) ×2 IMPLANT
ELECT REM PT RETURN 9FT ADLT (ELECTROSURGICAL) ×2
ELECTRODE REM PT RTRN 9FT ADLT (ELECTROSURGICAL) ×1 IMPLANT
GLOVE BIO SURGEON STRL SZ7 (GLOVE) ×4 IMPLANT
GLOVE BIOGEL PI IND STRL 7.0 (GLOVE) IMPLANT
GLOVE BIOGEL PI IND STRL 7.5 (GLOVE) ×1 IMPLANT
GLOVE BIOGEL PI INDICATOR 7.0 (GLOVE) ×2
GLOVE BIOGEL PI INDICATOR 7.5 (GLOVE) ×1
GLOVE ECLIPSE 6.5 STRL STRAW (GLOVE) ×1 IMPLANT
GOWN STRL REUS W/ TWL LRG LVL3 (GOWN DISPOSABLE) ×2 IMPLANT
GOWN STRL REUS W/TWL LRG LVL3 (GOWN DISPOSABLE) ×4
KIT MARKER MARGIN INK (KITS) ×2 IMPLANT
LIQUID BAND (GAUZE/BANDAGES/DRESSINGS) ×3 IMPLANT
MARKER SKIN DUAL TIP RULER LAB (MISCELLANEOUS) ×4 IMPLANT
NDL HYPO 25X1 1.5 SAFETY (NEEDLE) ×1 IMPLANT
NDL SAFETY ECLIPSE 18X1.5 (NEEDLE) IMPLANT
NEEDLE HYPO 18GX1.5 SHARP (NEEDLE) ×2
NEEDLE HYPO 25X1 1.5 SAFETY (NEEDLE) ×4 IMPLANT
NS IRRIG 1000ML POUR BTL (IV SOLUTION) ×1 IMPLANT
PACK BASIN DAY SURGERY FS (CUSTOM PROCEDURE TRAY) ×2 IMPLANT
PENCIL BUTTON HOLSTER BLD 10FT (ELECTRODE) ×2 IMPLANT
SHEET MEDIUM DRAPE 40X70 STRL (DRAPES) IMPLANT
SLEEVE SCD COMPRESS KNEE MED (MISCELLANEOUS) ×2 IMPLANT
SPONGE GAUZE 4X4 12PLY STER LF (GAUZE/BANDAGES/DRESSINGS) ×1 IMPLANT
SPONGE LAP 4X18 X RAY DECT (DISPOSABLE) ×3 IMPLANT
STAPLER VISISTAT 35W (STAPLE) ×2 IMPLANT
STOCKINETTE IMPERVIOUS LG (DRAPES) IMPLANT
STRIP CLOSURE SKIN 1/2X4 (GAUZE/BANDAGES/DRESSINGS) ×2 IMPLANT
SUT ETHILON 2 0 FS 18 (SUTURE) IMPLANT
SUT MNCRL AB 4-0 PS2 18 (SUTURE) ×2 IMPLANT
SUT MON AB 5-0 PS2 18 (SUTURE) IMPLANT
SUT SILK 2 0 SH (SUTURE) IMPLANT
SUT VIC AB 2-0 SH 27 (SUTURE) ×4
SUT VIC AB 2-0 SH 27XBRD (SUTURE) ×1 IMPLANT
SUT VIC AB 3-0 SH 27 (SUTURE) ×2
SUT VIC AB 3-0 SH 27X BRD (SUTURE) ×1 IMPLANT
SUT VIC AB 5-0 PS2 18 (SUTURE) IMPLANT
SYR CONTROL 10ML LL (SYRINGE) ×2 IMPLANT
TOWEL OR 17X24 6PK STRL BLUE (TOWEL DISPOSABLE) ×2 IMPLANT
TOWEL OR NON WOVEN STRL DISP B (DISPOSABLE) ×1 IMPLANT
TUBE CONNECTING 20X1/4 (TUBING) ×2 IMPLANT
YANKAUER SUCT BULB TIP NO VENT (SUCTIONS) ×1 IMPLANT

## 2014-09-20 NOTE — Transfer of Care (Signed)
Immediate Anesthesia Transfer of Care Note  Patient: [name]  Procedure(s) Performed: Procedure(s): RADIOACTIVE SEED GUIDED LEFT BREAST LUMPECTOMY WITH AXILLARY SENTINEL LYMPH NODE BIOPSY (Left)  Patient Location: PACU  Anesthesia Type:GA combined with regional for post-op pain  Level of Consciousness: awake, alert , oriented and patient cooperative  Airway & Oxygen Therapy: Patient Spontanous Breathing and Patient connected to face mask oxygen  Post-op Assessment: Report given to PACU RN and Post -op Vital signs reviewed and stable  Post vital signs: Reviewed and stable  Complications: No apparent anesthesia complications

## 2014-09-20 NOTE — Progress Notes (Signed)
Assisted Dr. Marcie Bal with left, ultrasound guided, pectoralis block. Side rails up, monitors on throughout procedure. See vital signs in flow sheet. Tolerated Procedure well.

## 2014-09-20 NOTE — Discharge Instructions (Signed)
[facility] °Office Phone Number [phone] ° °BREAST BIOPSY/ PARTIAL MASTECTOMY: POST OP INSTRUCTIONS ° °Always review your discharge instruction sheet given to you by the facility where your surgery was performed. ° °IF YOU HAVE DISABILITY OR FAMILY LEAVE FORMS, YOU MUST BRING THEM TO THE OFFICE FOR PROCESSING.  DO NOT GIVE THEM TO YOUR DOCTOR. ° °1. A prescription for pain medication may be given to you upon discharge.  Take your pain medication as prescribed, if needed.  If narcotic pain medicine is not needed, then you may take acetaminophen (Tylenol), naprosyn (Alleve) or ibuprofen (Advil) as needed. °2. Take your usually prescribed medications unless otherwise directed °3. If you need a refill on your pain medication, please contact your pharmacy.  They will contact our office to request authorization.  Prescriptions will not be filled after 5pm or on week-ends. °4. You should eat very light the first 24 hours after surgery, such as soup, crackers, pudding, etc.  Resume your normal diet the day after surgery. °5. Most patients will experience some swelling and bruising in the breast.  Ice packs and a good support bra will help.  Wear the breast binder provided or a sports bra for 72 hours day and night.  After that wear a sports bra during the day until you return to the office. Swelling and bruising can take several days to resolve.  °6. It is common to experience some constipation if taking pain medication after surgery.  Increasing fluid intake and taking a stool softener will usually help or prevent this problem from occurring.  A mild laxative (Milk of Magnesia or Miralax) should be taken according to package directions if there are no bowel movements after 48 hours. °7. Unless discharge instructions indicate otherwise, you may remove your bandages 48 hours after surgery and you may shower at that time.  You may have steri-strips (small skin tapes) in place directly over the incision.   These strips should be left on the skin for 7-10 days and will come off on their own.  If your surgeon used skin glue on the incision, you may shower in 24 hours.  The glue will flake off over the next 2-3 weeks.  Any sutures or staples will be removed at the office during your follow-up visit. °8. ACTIVITIES:  You may resume regular daily activities (gradually increasing) beginning the next day.  Wearing a good support bra or sports bra minimizes pain and swelling.  You may have sexual intercourse when it is comfortable. °a. You may drive when you no longer are taking prescription pain medication, you can comfortably wear a seatbelt, and you can safely maneuver your car and apply brakes. °b. RETURN TO WORK:  ______________________________________________________________________________________ °9. You should see your doctor in the office for a follow-up appointment approximately two weeks after your surgery.  Your doctor’s nurse will typically make your follow-up appointment when she calls you with your pathology report.  Expect your pathology report 3-4 business days after your surgery.  You may call to check if you do not hear from us after three days. °10. OTHER INSTRUCTIONS: _______________________________________________________________________________________________ _____________________________________________________________________________________________________________________________________ °_____________________________________________________________________________________________________________________________________ °_____________________________________________________________________________________________________________________________________ ° °WHEN TO CALL DR WAKEFIELD: °1. Fever over 101.0 °2. Nausea and/or vomiting. °3. Extreme swelling or bruising. °4. Continued bleeding from incision. °5. Increased pain, redness, or drainage from the incision. ° °The clinic staff is available to  answer your questions during regular business hours.  Please don’t hesitate to call and ask to speak to one of the nurses for   clinical concerns.  If you have a medical emergency, go to the nearest emergency room or call 911.  A surgeon from [facility] is always on call at the hospital.  For further questions, please visit [email or web address]     Post Anesthesia Home Care Instructions  Activity: Get plenty of rest for the remainder of the day. A responsible adult should stay with you for 24 hours following the procedure.  For the next 24 hours, DO NOT: -Drive a car -Paediatric nurse -Drink alcoholic beverages -Take any medication unless instructed by your physician -Make any legal decisions or sign important papers.  Meals: Start with liquid foods such as gelatin or soup. Progress to regular foods as tolerated. Avoid greasy, spicy, heavy foods. If nausea and/or vomiting occur, drink only clear liquids until the nausea and/or vomiting subsides. Call your physician if vomiting continues.  Special Instructions/Symptoms: Your throat may feel dry or sore from the anesthesia or the breathing tube placed in your throat during surgery. If this causes discomfort, gargle with warm salt water. The discomfort should disappear within 24 hours.   Call your surgeon if you experience:   1.  Fever over 101.0. 2.  Inability to urinate. 3.  Nausea and/or vomiting. 4.  Extreme swelling or bruising at the surgical site. 5.  Continued bleeding from the incision. 6.  Increased pain, redness or drainage from the incision. 7.  Problems related to your pain medication. 8. Any change in color, movement and/or sensation 9. Any problems and/or concerns

## 2014-09-20 NOTE — Interval H&P Note (Signed)
History and Physical Interval Note:  09/20/2014 10:04 AM  [name]  has presented today for surgery, with the diagnosis of left breast cancer  The various methods of treatment have been discussed with the patient and family. After consideration of risks, benefits and other options for treatment, the patient has consented to  Procedure(s): RADIOACTIVE SEED GUIDED LEFT BREAST LUMPECTOMY WITH AXILLARY SENTINEL LYMPH NODE BIOPSY AND POSSIBLE AXILLARY LYMPH NODE DISECTION (Left) as a surgical intervention .  The patient's history has been reviewed, patient examined, no change in status, stable for surgery.  I have reviewed the patient's chart and labs.  Questions were answered to the patient's satisfaction.     Kham Zuckerman

## 2014-09-20 NOTE — Op Note (Signed)
Preoperative diagnosis: clinical stage III left breast cancer, tnbc Postoperative diagnosis: same as above Procedure:  1. Left breast radioactive seed guided lumpectomy 2. Left axillary sentinel node biopsy 3. Injection blue dye for sentinel node identification Surgeon: Dr Serita Grammes Anesthesia: General with pectoral block EBL: minimal Specimens: 1. Left breast tissue marked with paint 2. Additional left breast deep margin 3. Left axillary sentinel node (1 blue)(counts from 91-273) Drains: None Complications: None Sponge needle count was correct completion Disposition to recovery in stable condition  Indications: This is a 64 yo female with a history of colon cancer who presented with a clinical stage III left breast cancer. She underwent primary chemotherapy. Her lymph node is now clinically negative. Her tumor has shrunk and is not visible on MRI. We discussed all of the options and decided to proceed with a lumpectomy. She has been registered for the [facility] sentinel node after chemotherapy so we will plan on sentinel node biopsy at the time of surgery.  Procedure: After informed consent was obtained the patient was first injected with technetium. She had a radioactive seed placed already. The mammograms were available in the operating room. She underwent a pectoral block. She was given 2 g of cefazolin. Sequential compression devices were on her legs. She was placed under general anesthesia without complication. Her left breast and axilla were then prepped and draped in the standard sterile surgical fashion. A surgical timeout was then performed.  I first infiltrated a mixture of methylene blue and saline in the periareolar position. I massaged this for 2 minutes. I then located the radioactive seed with the neoprobe. I made a curvilinear incision overlying the tumor in the upper outer quadrant of the left breast. I then used the neoprobe to guide the excision of the seed as well as  the surrounding tissue with the attempt to get a clear margin. This was then painted. The seed was confirmed to be in the specimen with the neoprobe. There was no more radioactivity in the breast. A Faxitron mammogram was taken confirming removal of the clip and the seed. This was also confirmed by radiology. I then took an additional margin that is labeled the deep margin. The deep margin as the pectoralis muscle. This was labeled short stitch superior, long stitch lateral, double stitch deep. I then placed 2 clips deep and one clip in each position around the cavity. I closed the breast tissue 2-0 Vicryl. Skin was closed with 3-0 Vicryl for Monocryl.  I then located the sentinel node. I made an incision below the hairline in the axilla. I carried this through the axillary fascia. I then noted one lymph node that was blue with a count of 273. This was excised. 2 additional radioactive nodes were identified. All these were sent to pathology. There was no more background radioactivity. There were no palpable nodes and no more visibly blue nodes. I then awaited the frozen section. 2 of the 3 nodes showed changes consistent with chemotherapy but all were benign. She was not randomized for the trial at this point. I then closed this with 3-0 Vicryl and 4-0 Monocryl. Dermabond and steristrips placed on both incisions. A breast binder was placed. She tolerated this well and was extubated and transferred to the recovery room in stable condition.

## 2014-09-20 NOTE — H&P (Signed)
62 yof who noted a left breast mass in May. She had no discharge and no other breast complaints.. Her mom had breast cancer in 35s or 31s and is still alive in her 74s. She has a personal history of colorectal cancer in the 55s. She underwent mm and US showing a left breast mass. [place] has a 2.6x2.5x2.6 cm mass present and multiple enlarged axillary nodes. MRI has a 3.8x3.6x2.9 cm lesion with some adjacent post biopsy changes in the lateral breast with combined diameter of 5.3 cm. There are multiple metastatic level one and two nodes. The right is normal. 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 The node is positive. she has since undergone primary chemotherapy which she completed end of november. She cannot identify the mass anymore. she has done well with chemotherapy except for peripheral neuropathy. She has undergone repeat mri which essentially shows complete resolution of the breast mass and nodes.    Past Surgical History Lars Mage [place], [place]; 08/31/2014 11:28 AM) Breast Biopsy Left. Colon Polyp Removal - Colonoscopy Hysterectomy (not due to cancer) - Partial Shoulder Surgery Right.  Diagnostic Studies History Lars Mage [place], [place]; 08/31/2014 11:28 AM) Mammogram within last year Pap Smear 1-5 years ago  Allergies Illene [place], [place]; 08/31/2014 11:29 AM) No Known Drug Allergies12/17/2015  Medication History (Alisha Spillers, MA; 08/31/2014 11:33 AM) Decadron (4MG  Tablet, Oral) Active. EMLA (2.5-2.5% Cream, External) Active. Lotemax (0.5% Suspension, Ophthalmic) Active. Zofran (8MG  Tablet, Oral) Active. Potassium Chloride PRT CR (10MEQ Tablet ER, Oral) Active. Tobradex (Ophthalmic) Active. Medications Reconciled  Social History Lars Mage [place], [place]; 08/31/2014 11:28 AM) Caffeine use Carbonated beverages, Coffee. No drug use Tobacco use Never smoker.  Family History Lars Mage [place], [place];  08/31/2014 11:28 AM) Breast Cancer Mother. Diabetes Mellitus Mother, Sister. Hypertension Father, Mother.  Review of Systems Lars Mage Spillers [place]; 08/31/2014 11:28 AM) General Present- Fatigue. Not Present- Appetite Loss, Chills, Fever, Night Sweats, Weight Gain and Weight Loss. Skin Not Present- Change in Wart/Mole, Dryness, Hives, Jaundice, New Lesions, Non-Healing Wounds, Rash and Ulcer. HEENT Not Present- Earache, Hearing Loss, Hoarseness, Nose Bleed, Oral Ulcers, Ringing in the Ears, Seasonal Allergies, Sinus Pain, Sore Throat, Visual Disturbances, Wears glasses/contact lenses and Yellow Eyes. Respiratory Not Present- Bloody sputum, Chronic Cough, Difficulty Breathing, Snoring and Wheezing. Breast Not Present- Breast Mass, Breast Pain, Nipple Discharge and Skin Changes. Cardiovascular Present- Shortness of Breath. Not Present- Chest Pain, Difficulty Breathing Lying Down, Leg Cramps, Palpitations, Rapid Heart Rate and Swelling of Extremities. Gastrointestinal Not Present- Abdominal Pain, Bloating, Bloody Stool, Change in Bowel Habits, Chronic diarrhea, Constipation, Difficulty Swallowing, Excessive gas, Gets full quickly at meals, Hemorrhoids, Indigestion, Nausea, Rectal Pain and Vomiting. Neurological Present- Numbness and Tingling. Not Present- Decreased Memory, Fainting, Headaches, Seizures, Tremor, Trouble walking and Weakness.   Vitals (Alisha Spillers MA; 08/31/2014 11:29 AM) 08/31/2014 11:28 AM Weight: 234 lb Height: 62in Body Surface Area: 2.15 m Body Mass Index: 42.8 kg/m Pulse: 84 (Regular)  BP: 118/72 (Sitting, Left Arm, Standard)    Physical Exam Rolm Bookbinder MD; 08/31/2014 12:18 PM) General Mental Status-Alert. Orientation-Oriented X3.  Chest and Lung Exam Chest and lung exam reveals -on auscultation, normal breath sounds, no adventitious sounds and normal vocal resonance.  Breast Nipples Discharge - Bilateral - None. Breast - Left  -Note: vague area of tenderness left upper outer quadrant.  Breast - Right-Normal. Breast Lump-No Palpable Breast Mass.  Cardiovascular Cardiovascular examination reveals -normal heart sounds, regular rate and rhythm with no murmurs.  Lymphatic Head & Neck  General Head & Neck Lymphatics: Bilateral - Description - Normal. Axillary  General Axillary Region: Bilateral - Description - Normal. Note: no Palmer adenopathy     Assessment & Plan Rolm Bookbinder MD; 08/31/2014 12:22 PM) STAGE III CARCINOMA OF BREAST, LEFT (174.9  C50.912) Impression: I think she has a great response clinically and radiologically. I think she is a candidate for a lumpectomy at this point. We discussed that vs mastectomy and she would like to attempt lumpectomy. I think we can do this with seed guidance. we discussed possibility of positive margins necessitating another surgery. there is no advantage for mastectomy. she will receive radiotherapy either way. we discussed participation in the alliance trial for sn after primary chemotherapy. she is going to see research nurse to try to enroll. If she is we will test sn at time of surgery. if positive she is aware of randomization between xrt vs completeion alnd during surgery. if not a candidate will proceed with lumpectomy and alnd at this point.

## 2014-09-20 NOTE — Anesthesia Postprocedure Evaluation (Signed)
Anesthesia Post Note  Patient: [name]  Procedure(s) Performed: Procedure(s) (LRB): RADIOACTIVE SEED GUIDED LEFT BREAST LUMPECTOMY WITH AXILLARY SENTINEL LYMPH NODE BIOPSY (Left)  Anesthesia type: General  Patient location: PACU  Post pain: Pain level controlled and Adequate analgesia  Post assessment: Post-op Vital signs reviewed, Patient's Cardiovascular Status Stable, Respiratory Function Stable, Patent Airway and Pain level controlled  Last Vitals:  Filed Vitals:   09/20/14 1339  BP: 123/77  Pulse: 90  Temp: 36.6 C  Resp: 18    Post vital signs: Reviewed and stable  Level of consciousness: awake, alert  and oriented  Complications: No apparent anesthesia complications

## 2014-09-20 NOTE — Anesthesia Preprocedure Evaluation (Signed)
Anesthesia Evaluation  Patient identified by MRN, date of birth, ID band Patient awake    Reviewed: Allergy & Precautions, NPO status , Patient's Chart, lab work & pertinent test results  Airway Mallampati: II   Neck ROM: full    Dental   Pulmonary neg pulmonary ROS,          Cardiovascular negative cardio ROS      Neuro/Psych    GI/Hepatic H/o colon CA   Endo/Other  Morbid obesity  Renal/GU      Musculoskeletal   Abdominal   Peds  Hematology   Anesthesia Other Findings   Reproductive/Obstetrics Breast CA                             Anesthesia Physical Anesthesia Plan  ASA: II  Anesthesia Plan: General and Regional   Post-op Pain Management: MAC Combined w/ Regional for Post-op pain   Induction: Intravenous  Airway Management Planned: LMA  Additional Equipment:   Intra-op Plan:   Post-operative Plan:   Informed Consent: I have reviewed the patients History and Physical, chart, labs and discussed the procedure including the risks, benefits and alternatives for the proposed anesthesia with the patient or authorized representative who has indicated his/her understanding and acceptance.     Plan Discussed with: CRNA, Anesthesiologist and Surgeon  Anesthesia Plan Comments:         Anesthesia Quick Evaluation

## 2014-09-20 NOTE — Anesthesia Procedure Notes (Addendum)
Anesthesia Regional Block:  Pectoralis block  Pre-Anesthetic Checklist: ,, timeout performed, Correct Patient, Correct Site, Correct Laterality, Correct Procedure, Correct Position, site marked, Risks and benefits discussed,  Surgical consent,  Pre-op evaluation,  At surgeon's request and post-op pain management  Laterality: Left  Prep: chloraprep       Needles:   Needle Type: Echogenic Needle     Needle Length: 9cm 9 cm Needle Gauge: 21 and 21 G    Additional Needles:  Procedures: ultrasound guided (picture in chart) Pectoralis block Narrative:  Start time: 09/20/2014 8:50 AM End time: 09/20/2014 9:01 AM Injection made incrementally with aspirations every 5 mL.  Performed by: Personally  Anesthesiologist: HODIERNE, ADAM  Additional Notes: Pt tolerated the procedure well.   Procedure Name: LMA Insertion Date/Time: 09/20/2014 10:15 AM Performed by: Lyndee Leo Pre-anesthesia Checklist: Patient identified, Emergency Drugs available, Suction available and Patient being monitored Patient Re-evaluated:Patient Re-evaluated prior to inductionOxygen Delivery Method: Circle System Utilized Preoxygenation: Pre-oxygenation with 100% oxygen Intubation Type: IV induction Ventilation: Mask ventilation without difficulty LMA: LMA inserted LMA Size: 4.0 Number of attempts: 1 Airway Equipment and Method: bite block Placement Confirmation: positive ETCO2 Tube secured with: Tape Dental Injury: Teeth and Oropharynx as per pre-operative assessment

## 2014-09-21 ENCOUNTER — Encounter (HOSPITAL_BASED_OUTPATIENT_CLINIC_OR_DEPARTMENT_OTHER): Payer: Self-pay | Admitting: General Surgery

## 2014-09-22 ENCOUNTER — Other Ambulatory Visit: Payer: Self-pay | Admitting: Oncology

## 2014-09-26 ENCOUNTER — Telehealth: Payer: Self-pay | Admitting: *Deleted

## 2014-09-26 NOTE — Telephone Encounter (Signed)
09/26/14 at 11:59am - The research nurse called the pt to congratulate her on her final pathology results.   The pt said that she is "estatic" with her results.  The pt was thanked for her support of the [id number] study.  The research nurse explained to the pt that the nurse still has to enter "baseline" data for the study, even though she was not eligible for study randomization.  The pt verbalized understanding.  The pt was asked the date of her colon cancer diagnosis. The pt said that it was sometime "in the 1980"s".  The pt did not know a specific date.  She said that it was done at [facility] in [place].  She said that she was initially told that it was a cyst on her colon, but later she was told that it was colon cancer.  The pt said that it was found "very early".  She said that it was found when she was having her hysterectomy.  The pt apologized for not remembering the date.  The research nurse has been unable to locate any pathology reports in the 1980's confirming this diagnosis.  The pt was told that she might be eligible for another clinical trial, B-51.  The research nurse will follow up with the pt at her next office visit.

## 2014-10-02 ENCOUNTER — Encounter: Payer: Self-pay | Admitting: Radiation Oncology

## 2014-10-02 NOTE — Progress Notes (Signed)
Location of Breast Cancer: left upper breast cancer  Histology per Pathology Report:   02/22/2014 Diagnosis 1. Breast, left, needle core biopsy, mass, 12 o'clock - INVASIVE DUCTAL CARCINOMA. - SEE COMMENT. 2. Lymph node, biopsy, left axilla - METASTATIC CARCINOMA IN 1 OF 1 LYMPH NODE (1/1). - SEE COMMENT.  09/20/14 Diagnosis 1. Lymph node, sentinel, biopsy, left axillary #1 - THERE IS NO EVIDENCE OF CARCINOMA IN 1 OF 1 LYMPH NODE (0/1). 2. Lymph node, sentinel, biopsy, left axillary #2 - THERE IS NO EVIDENCE OF CARCINOMA IN 1 OF 1 LYMPH NODE (0/1). 3. Lymph node, sentinel, biopsy, left axillary #3 - THERE IS NO EVIDENCE OF CARCINOMA IN 1 OF 1 LYMPH NODE (0/1). 4. Breast, lumpectomy, left - BENIGN BREAST PARENCHYMA WITH TREATMENT RELATED CHANGES. - FIBROCYSTIC CHANGES WITH USUAL DUCTAL HYPERPLASIA AND CALCIFICATIONS. - THERE IS NO EVIDENCE OF MALIGNANCY. - SEE COMMENT. 5. Breast, excision, left breast additional deep margin - BENIGN BREAST PARENCHYMA AND SKELETAL MUSCLE. - THERE IS NO EVIDENCE OF MALIGNANCY IN THE SECTIONS EXAMINED. - SEE COMMENT.  Receptor Status: ER(0%), PR (0%), Her2-neu (no amplification)  Did patient present with symptoms (if so, please note symptoms) or was this found on screening mammography?: noted a left breast mass in May  Past/Anticipated interventions by surgeon, if any: 02/22/14 - left breast and lymph node biopsy, 09/20/14 - Procedure: RADIOACTIVE SEED GUIDED LEFT BREAST LUMPECTOMY WITH AXILLARY SENTINEL LYMPH NODE BIOPSY;  Surgeon: Rolm Bookbinder, MD;  Location: [facility];  Service: General;  Laterality: Left;  Past/Anticipated interventions by medical oncology, if any: neoadjuvant chemotherapy started 03/13/2014, with cyclophosphamide and doxorubicin in dose dense fashion x4, with Neulasta support on day 2, completed 04/24/2014, to be followed by weekly carboplatin and paclitaxel x12, paclitaxel reduced during cycle 5 and cycle 7  because of elevated bilirubin levels. S/P 12 cycles 08/14/14.  Lymphedema issues, if any:  Has swelling in her left arm  Pain issues, if any:  no has soreness in her left breast from surgery.  OBGYN:  Was 11 years with first menses, was 64 years old with birth of first child, she has 2 children, did not breastfeed, used bcp, has not used hormone replacement, had a hysterectomy in 1988.  SAFETY ISSUES:  Prior radiation? no  Pacemaker/ICD? no  Possible current pregnancy?no  Is the patient on methotrexate? no  Current Complaints / other details:  Patient has a history of partial colectomy for early stage colon cancer incidentally found during TAH/BSO in 1988.  She reports soreness in her left breast from lumpectomy surgery on 09/20/14.  BP 136/94 mmHg  Pulse 102  Temp(Src) 98.6 F (37 C) (Oral)  Resp 16  Ht 5' 1"  (1.549 m)  Wt 232 lb 4.8 oz (105.371 kg)  BMI 43.92 kg/m2

## 2014-10-04 ENCOUNTER — Ambulatory Visit
Admission: RE | Admit: 2014-10-04 | Discharge: 2014-10-04 | Disposition: A | Discharge status: Home / Self Care | Payer: Medicare Other | Source: Ambulatory Visit | Attending: Radiation Oncology | Admitting: Radiation Oncology

## 2014-10-04 ENCOUNTER — Encounter: Payer: Self-pay | Admitting: *Deleted

## 2014-10-04 ENCOUNTER — Encounter: Payer: Self-pay | Admitting: Radiation Oncology

## 2014-10-04 VITALS — BP 136/94 | HR 102 | Temp 98.6°F | Resp 16 | Ht 61.0 in | Wt 232.3 lb

## 2014-10-04 DIAGNOSIS — Z171 Estrogen receptor negative status [ER-]: Secondary | ICD-10-CM | POA: Insufficient documentation

## 2014-10-04 DIAGNOSIS — Z90710 Acquired absence of both cervix and uterus: Secondary | ICD-10-CM | POA: Insufficient documentation

## 2014-10-04 DIAGNOSIS — Z9012 Acquired absence of left breast and nipple: Secondary | ICD-10-CM | POA: Insufficient documentation

## 2014-10-04 DIAGNOSIS — C50412 Malignant neoplasm of upper-outer quadrant of left female breast: Secondary | ICD-10-CM

## 2014-10-04 DIAGNOSIS — Z510 Encounter for antineoplastic radiation therapy: Secondary | ICD-10-CM | POA: Diagnosis not present

## 2014-10-04 NOTE — Progress Notes (Signed)
Please see the Nurse Progress Note in the MD Initial Consult Encounter for this patient. 

## 2014-10-04 NOTE — Progress Notes (Signed)
Radiation Oncology         [phone]) [phone] ________________________________  Name: [name] MRN: [id number]  Date: 10/04/2014  DOB: 03/30/1951  Follow-Up Visit Note  CC: Maximino Greenland, MD  Rolm Bookbinder, MD    ICD-9-CM ICD-10-CM   1. Breast cancer of upper-outer quadrant of left female breast 174.4 C50.412     Diagnosis:  Clinical stage III, high-grade triple negative (ypT0, ypN0)   Narrative:   Patient returns today for further evaluation and planning for her radiation therapy treatments. The patient was initially seen in the multidisciplinary breast clinic on 03/01/2014. At that time the patient was felt to have a clinical stage III left breast cancer. The patient on examination had palpable lymphadenopathy as well as a palpable 8 x 6 cm mass in the upper outer quadrant of the left breast. Patient proceeded with neoadjuvant treatment with an excellent response radiographically and clinically. On January 6 patient was taken to the operating room at which time she underwent left breast radioactive seed guided lumpectomy, left axillary sentinel lymph node biopsy. Upon pathologic review the patient was found to have no residual disease in the breast specimen or within the axillary region.  a total of 3 sentinel lymph nodes were obtained at the time of her surgery. Patient is been doing well since her surgery. She continues to have some soreness and stiffness along the left breast and shoulder area but is making good progress.  The patient was felt to be a good candidate for enrollment  in protocol B 51 which is a phase 3 randomized study looking at regional nodal radiation therapy versus no regional nodal radiation therapy. Patient however after discussing participation with her family has decided not to proceed with enrollment in this study. Off protocol therefore I would recommend coverage of the axillary region to full dose (45 gray)  since she did have palpable and biopsy-proven  axillary lymph nodes prior to her neoadjuvant treatment.                     ALLERGIES:  has No Known Allergies.  Meds: Current Outpatient Prescriptions  Medication Sig Dispense Refill  . loteprednol (LOTEMAX) 0.5 % ophthalmic suspension Place 1 drop into both eyes 3 (three) times daily.    Marland Kitchen oxyCODONE-acetaminophen (PERCOCET) 10-325 MG per tablet Take 1 tablet by mouth every 6 (six) hours as needed for pain. 20 tablet 0  . potassium chloride (K-DUR) 10 MEQ tablet Take 1 tablet (10 mEq total) by mouth 2 (two) times daily. 60 tablet 4  . tobramycin-dexamethasone (TOBRADEX) ophthalmic solution Place 1 drop into both eyes every 4 (four) hours while awake. 5 mL 0  . acetaminophen (TYLENOL) 500 MG tablet Take 1,000 mg by mouth every 6 (six) hours as needed for mild pain or moderate pain.    Marland Kitchen dexamethasone (DECADRON) 4 MG tablet Take 2 tablets by mouth once a day on the day after chemotherapy and then take 2 tablets two times a day for 2 days. Take with food. (Patient not taking: Reported on 10/04/2014) 30 tablet 1  . lidocaine-prilocaine (EMLA) cream Apply 1 application topically once. Apply to port 1-2 hours prior to access. (Patient not taking: Reported on 10/04/2014) 30 g 1  . ondansetron (ZOFRAN) 8 MG tablet Take 1 tablet (8 mg total) by mouth 2 (two) times daily as needed. Start on the third day after chemotherapy. (Patient not taking: Reported on 10/04/2014) 30 tablet 0  . prochlorperazine (COMPAZINE) 10 MG tablet  Take 1 tablet (10 mg total) by mouth every 6 (six) hours as needed for nausea or vomiting. (Patient not taking: Reported on 08/14/2014) 30 tablet 0   No current facility-administered medications for this encounter.    Physical Findings: The patient is in no acute distress. Patient is alert and oriented.  height is 5' 1"  (1.549 m) and weight is 232 lb 4.8 oz (105.371 kg). Her oral temperature is 98.6 F (37 C). Her blood pressure is 136/94 and her pulse is 102. Her respiration is 16. Marland Kitchen  No palpable subclavicular or axillary adenopathy. The lungs are clear to auscultation. The heart has a regular rhythm and rate. Examination right breast reveals no mass or nipple discharge. Examination left breast reveals a scar in the upper outer quadrant which is healing well. patient has a separate scar in the axillary region from her sentinel node procedure also which is healing well without signs of drainage or infection. The patient has limited left arm and shoulder mobility in light of her recent surgery.    Lab Findings: Lab Results  Component Value Date   WBC 4.2 09/19/2014   HGB 11.1* 09/19/2014   HCT 34.5* 09/19/2014   MCV 93.2 09/19/2014   PLT 279 09/19/2014    Radiographic Findings: Nm Sentinel Node Inj-no Rpt (breast)  09/20/2014   CLINICAL DATA: left breast cancer   Sulfur colloid was injected intradermally by the nuclear medicine  technologist for breast cancer sentinel node localization.    Mm Breast Surgical Specimen  09/20/2014   CLINICAL DATA:  Biopsy proven left breast cancer. Status post surgical excision.  EXAM: SPECIMEN RADIOGRAPH OF THE LEFT BREAST  COMPARISON:  Previous exam(s)  FINDINGS: Status post excision of the left breast. The radioactive seed and biopsy marker clip are present, completely intact, and are marked for pathology.  IMPRESSION: Specimen radiograph of the left breast.   Electronically Signed   By: Lillia Mountain M.D.   On: 09/20/2014 10:56   Mm Digital Diagnostic Bilat  09/12/2014   CLINICAL DATA:  Patient with known left breast carcinoma, recently treated with neoadjuvant chemotherapy. This is to be assess the left breast mass.  EXAM: DIGITAL DIAGNOSTIC  BILATERAL MAMMOGRAM WITH CAD  ULTRASOUND LEFT BREAST  COMPARISON:  Prior exams, most recent dated 02/22/2014  ACR Breast Density Category b: There are scattered areas of fibroglandular density.  FINDINGS: The left breast post biopsy hematoma has resolved. The left breast mass is smaller than it was on the  study dated 03/07/2013. In the place of the mass there is ill-defined density surrounding biopsy clip.  No new breast masses. No areas of architectural distortion. There are no suspicious calcifications. No other changes.  Mammographic images were processed with CAD.  Ultrasound is performed, showing a faint mildly hypoechoic oval mass measuring 9.3 mm x 5.3 mm x 12 mm. Previously this mass measured 2.6 cm x 2.5 cm x 2.6 cm.  IMPRESSION: Marked response to chemotherapy with significant reduction in the size of the left breast carcinoma. No new abnormalities.  RECOMMENDATION: Continued planned treatment for the known left breast carcinoma. Annual surveillance mammography for the right breast.  I have discussed the findings and recommendations with the patient. Results were also provided in writing at the conclusion of the visit. If applicable, a reminder letter will be sent to the patient regarding the next appointment.  BI-RADS CATEGORY  6: Known biopsy-proven malignancy.   Electronically Signed   By: Lajean Manes M.D.   On: 09/12/2014 11:42  US Breast Ltd Uni Left Inc Axilla  09/12/2014   CLINICAL DATA:  Patient with known left breast carcinoma, recently treated with neoadjuvant chemotherapy. This is to be assess the left breast mass.  EXAM: DIGITAL DIAGNOSTIC  BILATERAL MAMMOGRAM WITH CAD  ULTRASOUND LEFT BREAST  COMPARISON:  Prior exams, most recent dated 02/22/2014  ACR Breast Density Category b: There are scattered areas of fibroglandular density.  FINDINGS: The left breast post biopsy hematoma has resolved. The left breast mass is smaller than it was on the study dated 03/07/2013. In the place of the mass there is ill-defined density surrounding biopsy clip.  No new breast masses. No areas of architectural distortion. There are no suspicious calcifications. No other changes.  Mammographic images were processed with CAD.  Ultrasound is performed, showing a faint mildly hypoechoic oval mass measuring 9.3  mm x 5.3 mm x 12 mm. Previously this mass measured 2.6 cm x 2.5 cm x 2.6 cm.  IMPRESSION: Marked response to chemotherapy with significant reduction in the size of the left breast carcinoma. No new abnormalities.  RECOMMENDATION: Continued planned treatment for the known left breast carcinoma. Annual surveillance mammography for the right breast.  I have discussed the findings and recommendations with the patient. Results were also provided in writing at the conclusion of the visit. If applicable, a reminder letter will be sent to the patient regarding the next appointment.  BI-RADS CATEGORY  6: Known biopsy-proven malignancy.   Electronically Signed   By: Lajean Manes M.D.   On: 09/12/2014 11:42   Mm Lt Radioactive Seed Loc Mammo Guide  09/19/2014   CLINICAL DATA:  Preoperative localization, left 1 o'clock location invasive ductal carcinoma.  EXAM: MAMMOGRAPHIC GUIDED RADIOACTIVE SEED LOCALIZATION OF THE left BREAST  COMPARISON:  Previous exam(s)  FINDINGS: Patient presents for radioactive seed localization prior to lumpectomy. I met with the patient and we discussed the procedure of seed localization including benefits and alternatives. We discussed the high likelihood of a successful procedure. We discussed the risks of the procedure including infection, bleeding, tissue injury and further surgery. We discussed the low dose of radioactivity involved in the procedure. Informed, written consent was given.  The usual time-out protocol was performed immediately prior to the procedure.  Using mammographic guidance, sterile technique, 2% lidocaine and an I-125 radioactive seed, the ribbon shaped clip was localized using a superior to inferior approach. The follow-up mammogram images confirm the seed in the expected location and are marked for Dr. [name].  Follow-up survey of the patient confirms presence of radioactive seed.  Order number of I-125 seed:  [id number].  Total activity:  0.245  Reference Date:  08/22/2014  The patient tolerated the procedure well and was released from the [place]. She was given instructions regarding seed removal.  IMPRESSION: Radioactive seed localization left breast. No apparent complications.   Electronically Signed   By: Conchita Paris M.D.   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 As above the patient has had an excellent response to her neoadjuvant treatment with no residual disease within the left breast or axillary region. Patient has elected not to participate in protocol B 51. Given her significant disease at time of presentation within the axilla I would recommend full coverage of the axillary region at the time of her radiation therapy directed at the left breast for breast conservation therapy.  I discussed the treatment course side effects and  potential toxicities of radiation therapy in this situation with the patient. She appears to understand wishes to proceed with radiation therapy as recommended.   Plan:  Simulation and planning approximately 5 weeks postop with treatments to begin 6 weeks postop. The patient will be meeting with Dr. [name] later this week and hopefully will have the okay to begin stretching exercises to help with her left arm and shoulder mobility in preparation for her radiation planning and treatment.  ____________________________________ Blair Promise, MD

## 2014-10-13 ENCOUNTER — Ambulatory Visit: Payer: Medicare Other | Attending: General Surgery | Admitting: Physical Therapy

## 2014-10-13 DIAGNOSIS — M25612 Stiffness of left shoulder, not elsewhere classified: Secondary | ICD-10-CM

## 2014-10-13 DIAGNOSIS — R531 Weakness: Secondary | ICD-10-CM | POA: Insufficient documentation

## 2014-10-13 DIAGNOSIS — M25512 Pain in left shoulder: Secondary | ICD-10-CM | POA: Insufficient documentation

## 2014-10-13 NOTE — Therapy (Signed)
[place], [place], [address] Phone: [phone]   Fax:  [phone]  Physical Therapy Evaluation  Patient Details  Name: [name] MRN: [id number] Date of Birth: 06-Mar-1951 Referring Provider:  Rolm Bookbinder, MD  Encounter Date: 10/13/2014      PT End of Session - 10/13/14 1420    Visit Number 1   Number of Visits 13   Date for PT Re-Evaluation 11/13/14   PT Start Time 0935   PT Stop Time 1015   PT Time Calculation (min) 40 min   Activity Tolerance Patient tolerated treatment well   Behavior During Therapy [facility] for tasks assessed/performed      Past Medical History  Diagnosis Date  . Hx of rotator cuff surgery   . Colon cancer   . left breast cancer     Past Surgical History  Procedure Laterality Date  . Abdominal hysterectomy  1988  . Colon surgery  1988    colectomy/colostomy-ca  . Colon surgery  1988    colostomy takedown-reversal  . Colonoscopy    . Total shoulder arthroplasty  2009    right  . Radioactive seed guided mastectomy with axillary sentinel lymph node biopsy Left 09/20/2014    Procedure: RADIOACTIVE SEED GUIDED LEFT BREAST LUMPECTOMY WITH AXILLARY SENTINEL LYMPH NODE BIOPSY;  Surgeon: Rolm Bookbinder, MD;  Location: [facility];  Service: General;  Laterality: Left;    There were no vitals taken for this visit.  Visit Diagnosis:  Stiffness of shoulder joint, left  Pain in joint, shoulder region, left  Weakness generalized      Subjective Assessment - 10/13/14 0952    Symptoms ice cold hands and feet with numbness since chemotherapy  arm won't go high enough for radiation. She goes back to Dr. Sondra Come on Feb 9 for simulation so wants to increase her range of motion by then   Pertinent History history of shoulder problems with rotator cuff repair on right and likely damage on left.  pt was seen recently in our clinic for left shoulder range of  motion and has been trying to do those exercises at home.  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 No/denies  she has pain when stretching left shoulder          [facility] PT Assessment - 10/13/14 1433    Assessment   Medical Diagnosis breast cancer   Onset Date 01/13/14   Precautions   Precautions None   Restrictions   Weight Bearing Restrictions No   Balance Screen   Has the patient fallen in the past 6 months No   Has the patient had a decrease in activity level because of a fear of falling?  No   Is the patient reluctant to leave their home because of a fear of falling?  No   Home Environment   Living Enviornment Private residence   Living Arrangements Other relatives  grans   Available Help at Discharge Available PRN/intermittently   Type of [place] to enter   Entrance Stairs-Number of Steps 2   Entrance Stairs-Rails Right   [place] One level   Prior Function   Level of Independence Independent with basic ADLs;Independent with homemaking with ambulation   Cognition   Overall Cognitive Status Within Functional Limits for tasks assessed  Observation/Other Assessments   Observations Obese female who appears to be easily dyspneic   Skin Integrity well healing lumpetomy and node biopsy incisions   Sensation   Additional Comments pt reports hands and feet neuropathy impairment from chemotherapy   Coordination   Gross Motor Movements are Fluid and Coordinated Yes   Posture/Postural Control   Posture/Postural Control Postural limitations   Postural Limitations Rounded Shoulders;Forward head;Increased lumbar lordosis   AROM   Right Shoulder Extension 64 Degrees   Right Shoulder Flexion 148 Degrees   Right Shoulder ABduction 120 Degrees   Right Shoulder Internal Rotation 40 Degrees   Right Shoulder External Rotation 85 Degrees   Left  Shoulder Extension 62 Degrees   Left Shoulder Flexion 105 Degrees   Left Shoulder ABduction 84 Degrees   Left Shoulder Internal Rotation 37 Degrees   Left Shoulder External Rotation 44 Degrees   Strength   Overall Strength Deficits   Overall Strength Comments generalized weakness   Left Shoulder Flexion 3+/5   Left Shoulder Extension 3+/5   Left Shoulder ABduction 3+/5   Left Shoulder Internal Rotation 3+/5   Left Shoulder External Rotation 2/5   Palpation   Palpation firm tissue in left upper arm           LYMPHEDEMA/ONCOLOGY QUESTIONNAIRE - 10/13/14 1007    Right Upper Extremity Lymphedema   At Axilla  41 cm   15 cm Proximal to Olecranon Process 42.5 cm   10 cm Proximal to Olecranon Process 42 cm   Olecranon Process 28.1 cm   15 cm Proximal to Ulnar Styloid Process 28.6 cm   10 cm Proximal to Ulnar Styloid Process 25.5 cm   Just Proximal to Ulnar Styloid Process 18.1 cm   Across Hand at [employer] 19.9 cm   At [place] of 2nd Digit 6.5 cm   Left Upper Extremity Lymphedema   At Axilla  40.2 cm   15 cm Proximal to Olecranon Process 42.5 cm   10 cm Proximal to Olecranon Process 38.9 cm   Olecranon Process 28 cm   15 cm Proximal to Ulnar Styloid Process 27.9 cm   10 cm Proximal to Ulnar Styloid Process 24.8 cm   Just Proximal to Ulnar Styloid Process 17.2 cm   Across Hand at [employer] 19.8 cm   At [place] of 2nd Digit 6.4 cm           Quick Dash - 10/13/14 0001    Open a tight or new jar Severe difficulty   Do heavy household chores (wash walls, wash floors) Severe difficulty   Carry a shopping bag or briefcase Moderate difficulty   Wash your back Moderate difficulty   Use a knife to cut food Severe difficulty   Recreational activities in which you take some force or impact through your arm, shoulder, or hand (golf, hammering, tennis) Severe difficulty   During the past week, to what extent has your arm, shoulder or hand problem interfered with your normal  social activities with family, friends, neighbors, or groups? Modererately   During the past week, to what extent has your arm, shoulder or hand problem limited your work or other regular daily activities Quite a bit   Arm, shoulder, or hand pain. Moderate   Tingling (pins and needles) in your arm, shoulder, or hand Severe   Difficulty Sleeping No difficulty   DASH Score 59.09 %  PT Education - 05-Nov-2014 1420    Education provided Yes   Education Details exercise   Person(s) Educated Patient   Methods Explanation;Demonstration;Handout   Comprehension Need further instruction           Short Term Clinic Goals - 11/05/14 1426    CC Short Term Goal  #1   Title short term goals = long term goals             Long Term Clinic Goals - 2014/11/05 1432    CC Long Term Goal  #4   Title Decrease Quick Dash Score to 55   Time 4   Period Weeks   Status New            Plan - 11/05/14 1428    PT Treatment/Interventions Therapeutic exercise;Passive range of motion;Neuromuscular re-education;Patient/family education;Manual techniques;Therapeutic activities;Electrical Stimulation          G-Codes - 11-05-2014 1429    Functional Assessment Tool Used clincial judgement   Functional Limitation Other PT primary   Other PT Primary Current Status (J8250) At least 40 percent but less than 60 percent impaired, limited or restricted   Other PT Primary Goal Status (N3976) At least 20 percent but less than 40 percent impaired, limited or restricted       Problem List Patient Active Problem List   Diagnosis Date Noted  . Chemotherapy induced neutropenia 07/31/2014  . Neutropenia 07/03/2014  . Anemia 06/12/2014  . Hypokalemia 05/29/2014  . Watery eyes 05/09/2014  . Malignant neoplasm of colon, unspecified site 04/03/2014  . Family history of malignant neoplasm of breast 04/03/2014  . Breast cancer of upper-outer quadrant of left female breast  03/01/2014   Donato Heinz. Owens Shark, PT   11-05-2014, 2:41 PM  [place], [place], [address] Phone: [phone]   Fax:  [phone]

## 2014-10-13 NOTE — Patient Instructions (Signed)
Cane Exercise: Abduction   Hold cane with right hand over end, palm-up, with other hand palm-down. Move arm out from side and up by pushing with other arm. Hold __5__ seconds. Repeat __5_ times. Do ___5_ sessions per day.  http://gt2.exer.us/82   Copyright  VHI. All rights reserved.

## 2014-10-16 ENCOUNTER — Ambulatory Visit: Payer: Medicare Other | Attending: General Surgery | Admitting: Physical Therapy

## 2014-10-16 DIAGNOSIS — M25612 Stiffness of left shoulder, not elsewhere classified: Secondary | ICD-10-CM | POA: Diagnosis not present

## 2014-10-16 DIAGNOSIS — R531 Weakness: Secondary | ICD-10-CM | POA: Diagnosis not present

## 2014-10-16 DIAGNOSIS — M25512 Pain in left shoulder: Secondary | ICD-10-CM | POA: Diagnosis not present

## 2014-10-16 NOTE — Therapy (Signed)
[place], [place], [address] Phone: [phone]   Fax:  [phone]  Physical Therapy Treatment  Patient Details  Name: [name] MRN: [id number] Date of Birth: October 13, 1950 Referring Provider:  [place] Chard, MD  Encounter Date: 10/16/2014      PT End of Session - 10/16/14 1409    Visit Number 2   Number of Visits 13   Date for PT Re-Evaluation 11/13/14   PT Start Time 1306   PT Stop Time 4481   PT Time Calculation (min) 41 min      Past Medical History  Diagnosis Date  . Hx of rotator cuff surgery   . Colon cancer   . left breast cancer     Past Surgical History  Procedure Laterality Date  . Abdominal hysterectomy  1988  . Colon surgery  1988    colectomy/colostomy-ca  . Colon surgery  1988    colostomy takedown-reversal  . Colonoscopy    . Total shoulder arthroplasty  2009    right  . Radioactive seed guided mastectomy with axillary sentinel lymph node biopsy Left 09/20/2014    Procedure: RADIOACTIVE SEED GUIDED LEFT BREAST LUMPECTOMY WITH AXILLARY SENTINEL LYMPH NODE BIOPSY;  Surgeon: Rolm Bookbinder, MD;  Location: [facility];  Service: General;  Laterality: Left;    There were no vitals taken for this visit.  Visit Diagnosis:  Stiffness of shoulder joint, left  Pain in joint, shoulder region, left  Weakness generalized      Subjective Assessment - 10/16/14 1310    Symptoms pt states she is feeling good.  She was able to use a 2# bag of beans to stretch shoulder and was able to maintain it for about 12 minutes. Marland Kitchen  it felt a llttle tight, but not real pain   Currently in Pain? No/denies          [facility] Adult PT Treatment/Exercise - 10/16/14 1324    Neck Exercises: Stretches   Neck Stretch 3 reps   Lower Cervical/Upper Thoracic Stretch 3 reps   Neck Exercises: Seated   Shoulder Rolls Backwards   Lumbar Exercises: Supine   Ab Set 5 reps   Bent Knee  Raise 10 reps   Other Supine Lumbar Exercises lower trunk rotation   Knee/Hip Exercises: Supine   Short Arc Quad Sets Strengthening;Both;2 sets;5 reps  4#   Shoulder Exercises: Seated   Horizontal ABduction AAROM;Both;5 reps  with dowel rod   External Rotation AAROM;Both;10 reps  with dowel   Shoulder Exercises: Pulleys   Flexion 2 minutes   ABduction 2 minutes   Shoulder Exercises: Therapy Ball   Flexion --   ABduction --   Shoulder Exercises: ROM/Strengthening   Ball on Wall both hands on wall for flexion, horizontal adduction, horizontal abduction   Other ROM/Strengthening Exercises bilateral chop an lift in supine   Other ROM/Strengthening Exercises in right sidelying, left arm active external rotation.   Manual Therapy   Manual Therapy Scapular mobilization;Myofascial release;Passive ROM   Myofascial Release at axilla and upper lateral chest at area of subscapularis insertion.   Scapular Mobilization in right sidelying, mobilization of left scapua in all planes   Passive ROM to left upper extremity in supine and right sidelying in all planes                   Short Term Clinic Goals - 10/13/14 1426    CC Short Term Goal  #1  Title short term goals = long term goals             Long Term Clinic Goals - 10/16/14 1413    CC Long Term Goal  #1   Title pt will  have left shoulder abduction of  110 degrees to allow her to receive radiaton therapy   Time 4   Period Weeks   Status On-going   CC Long Term Goal  #2   Title pt will report a 50% decrease in pain in left shoulder to allow her to do home exercise to improve shoulder range of motion   Time 4   Period Weeks   Status On-going   CC Long Term Goal  #3   Title pt will be independent in home exercise program for ROM and general strength   Time 4   Period Weeks   Status On-going   CC Long Term Goal  #4   Title Decrease Quick Dash Score to 55   Time 4   Period Weeks   Status On-going             Plan - 10/16/14 1410    Clinical Impression Statement Pt appears to have some increased left shoulder range of motion with better tolerance of exercise and stretching. She had some release of tender tight areas and left axiila with manual techniques.    PT Next Visit Plan continue with active exercise and manual techniques to left shoulder    Consulted and Agree with Plan of Care Patient        Problem List Patient Active Problem List   Diagnosis Date Noted  . Chemotherapy induced neutropenia 07/31/2014  . Neutropenia 07/03/2014  . Anemia 06/12/2014  . Hypokalemia 05/29/2014  . Watery eyes 05/09/2014  . Malignant neoplasm of colon, unspecified site 04/03/2014  . Family history of malignant neoplasm of breast 04/03/2014  . Breast cancer of upper-outer quadrant of left female breast 03/01/2014   Donato Heinz. Owens Shark, PT   10/16/2014, 2:14 PM  [place], [place], [address] Phone: [phone]   Fax:  [phone]

## 2014-10-18 ENCOUNTER — Ambulatory Visit: Payer: Medicare Other | Admitting: Physical Therapy

## 2014-10-18 DIAGNOSIS — R531 Weakness: Secondary | ICD-10-CM

## 2014-10-18 DIAGNOSIS — M25612 Stiffness of left shoulder, not elsewhere classified: Secondary | ICD-10-CM

## 2014-10-18 DIAGNOSIS — M25512 Pain in left shoulder: Secondary | ICD-10-CM

## 2014-10-18 NOTE — Therapy (Signed)
[place], [place], [address] Phone: [phone]   Fax:  [phone]  Physical Therapy Treatment  Patient Details  Name: [name] MRN: [id number] Date of Birth: 1951-04-27 Referring Provider:  Glendale Chard, MD  Encounter Date: 10/18/2014      PT End of Session - 10/18/14 1131    Visit Number 3   Number of Visits 13   Date for PT Re-Evaluation 11/13/14   PT Start Time 4268   PT Stop Time 1100   PT Time Calculation (min) 45 min   Activity Tolerance Patient tolerated treatment well   Behavior During Therapy [facility] for tasks assessed/performed      Past Medical History  Diagnosis Date  . Hx of rotator cuff surgery   . Colon cancer   . left breast cancer     Past Surgical History  Procedure Laterality Date  . Abdominal hysterectomy  1988  . Colon surgery  1988    colectomy/colostomy-ca  . Colon surgery  1988    colostomy takedown-reversal  . Colonoscopy    . Total shoulder arthroplasty  2009    right  . Radioactive seed guided mastectomy with axillary sentinel lymph node biopsy Left 09/20/2014    Procedure: RADIOACTIVE SEED GUIDED LEFT BREAST LUMPECTOMY WITH AXILLARY SENTINEL LYMPH NODE BIOPSY;  Surgeon: Rolm Bookbinder, MD;  Location: [facility];  Service: General;  Laterality: Left;    There were no vitals taken for this visit.  Visit Diagnosis:  Stiffness of shoulder joint, left  Pain in joint, shoulder region, left  Weakness generalized      Subjective Assessment - 10/18/14 1022    Symptoms Pt reports she is doing well, just some soreness.  She goes to see Dr. [name] this week, to see Dr. Sondra Come re: radiation next week.   She is wearing the tg soft intermittently to help with upper extremity pain   Currently in Pain? No/denies  "soreness"                    [facility] Adult PT Treatment/Exercise - 10/18/14 1036    Neck Exercises: Stretches   Neck Stretch 3 reps   Lower Cervical/Upper Thoracic Stretch 3 reps   Neck Exercises: Seated   Neck Retraction 1 rep   Cervical Rotation Both   Lateral Flexion Both   Shoulder Rolls Backwards   Lumbar Exercises: Supine   Ab Set 5 reps   Glut Set 5 reps   Bent Knee Raise 10 reps   Other Supine Lumbar Exercises lower trunk rotation   Knee/Hip Exercises: Standing   Functional Squat 10 reps  power up sit to stand from high surface   Knee/Hip Exercises: Supine   Short Arc Quad Sets --   Shoulder Exercises: Seated   Horizontal ABduction --   External Rotation --   Shoulder Exercises: Pulleys   Flexion 2 minutes   ABduction 2 minutes   Shoulder Exercises: ROM/Strengthening   Ball on Wall both hands on wall for flexion, horizontal adduction, horizontal abduction   Other ROM/Strengthening Exercises --   Other ROM/Strengthening Exercises --   Shoulder Exercises: Stretch   Other Shoulder Stretches Codman exercise with 2# wieight for traction.   Manual Therapy   Joint Mobilization to [facility] Joint and hurmeral head small grade oscillations.   Myofascial Release to anterior chest and upper arm, myofascial pulling   Passive ROM to left shoulder  PT Education - 10/18/14 1130    Education provided Yes   Education Details exercise   Person(s) Educated Patient   Methods Explanation;Demonstration   Comprehension Verbalized understanding;Returned demonstration           Short Term Clinic Goals - 10/13/14 1426    CC Short Term Goal  #1   Title short term goals = long term goals             Long Term Clinic Goals - 10/16/14 1413    CC Long Term Goal  #1   Title pt will  have left shoulder abduction of  110 degrees to allow her to receive radiaton therapy   Time 4   Period Weeks   Status On-going   CC Long Term Goal  #2   Title pt will report a 50% decrease in pain in left shoulder to allow her to do home exercise to improve shoulder range of motion   Time  4   Period Weeks   Status On-going   CC Long Term Goal  #3   Title pt will be independent in home exercise program for ROM and general strength   Time 4   Period Weeks   Status On-going   CC Long Term Goal  #4   Title Decrease Quick Dash Score to 55   Time 4   Period Weeks   Status On-going            Plan - 10/18/14 1131    Clinical Impression Statement Pt continues with pain and decrease shoulder strength.... question if she has some rotator cuff impairment?  She will benefit from continued work on shoulder stretching and general exercise to better tolerate radiation treatment.   PT Next Visit Plan continue with active exercise and manual techniques to left shoulder  Also add core and LE strengthening.   Consulted and Agree with Plan of Care Patient        Problem List Patient Active Problem List   Diagnosis Date Noted  . Chemotherapy induced neutropenia 07/31/2014  . Neutropenia 07/03/2014  . Anemia 06/12/2014  . Hypokalemia 05/29/2014  . Watery eyes 05/09/2014  . Malignant neoplasm of colon, unspecified site 04/03/2014  . Family history of malignant neoplasm of breast 04/03/2014  . Breast cancer of upper-outer quadrant of left female breast 03/01/2014   Donato Heinz. Owens Shark PT   Norwood Levo 10/18/2014, 11:34 AM  [place], [place], [address] Phone: [phone]   Fax:  [phone]

## 2014-10-18 NOTE — Patient Instructions (Signed)
Codmans exercise with 2# weights

## 2014-10-23 ENCOUNTER — Ambulatory Visit: Payer: Medicare Other | Admitting: Physical Therapy

## 2014-10-23 DIAGNOSIS — M25512 Pain in left shoulder: Secondary | ICD-10-CM

## 2014-10-23 DIAGNOSIS — M25612 Stiffness of left shoulder, not elsewhere classified: Secondary | ICD-10-CM

## 2014-10-23 DIAGNOSIS — R531 Weakness: Secondary | ICD-10-CM

## 2014-10-23 NOTE — Therapy (Signed)
[place], [place], [address] Phone: [phone]   Fax:  [phone]  Physical Therapy Treatment  Patient Details  Name: CACHET MCCUTCHEN MRN: [id number] Date of Birth: 1951-03-15 Referring Provider:  Glendale Chard, MD  Encounter Date: 10/23/2014      PT End of Session - 10/23/14 1249    Visit Number 4   Number of Visits 13   Date for PT Re-Evaluation 11/13/14   PT Start Time 0940   PT Stop Time 1019   PT Time Calculation (min) 39 min   Activity Tolerance Patient limited by pain   Behavior During Therapy [facility] for tasks assessed/performed      Past Medical History  Diagnosis Date  . Hx of rotator cuff surgery   . Colon cancer   . left breast cancer     Past Surgical History  Procedure Laterality Date  . Abdominal hysterectomy  1988  . Colon surgery  1988    colectomy/colostomy-ca  . Colon surgery  1988    colostomy takedown-reversal  . Colonoscopy    . Total shoulder arthroplasty  2009    right  . Radioactive seed guided mastectomy with axillary sentinel lymph node biopsy Left 09/20/2014    Procedure: RADIOACTIVE SEED GUIDED LEFT BREAST LUMPECTOMY WITH AXILLARY SENTINEL LYMPH NODE BIOPSY;  Surgeon: Rolm Bookbinder, MD;  Location: [facility];  Service: General;  Laterality: Left;    There were no vitals taken for this visit.  Visit Diagnosis:  Stiffness of shoulder joint, left  Pain in joint, shoulder region, left  Weakness generalized      Subjective Assessment - 10/23/14 0940    Symptoms "I went to Dr. [name] Friday; he looked at my incision and he said it was well."  Has CT for radiation simulation tomorrow.   Currently in Pain? No/denies  but shoulder hurt all weekend, so didn't do much exercise at home                    [facility] Adult PT Treatment/Exercise - 10/23/14 0001    Shoulder Exercises: Pulleys   Flexion 2 minutes   ABduction 2 minutes    Shoulder Exercises: Therapy Ball   Flexion 10 reps   Shoulder Exercises: ROM/Strengthening   Over Head Lace In supine, hands clasped and bilateral shoulder flexion x 8   Other ROM/Strengthening Exercises wall finger ladder for left abduction x 5   Manual Therapy   Myofascial Release crosshands technique horizontal and vertical at chest/left chest; left UE myofascial pulling    Scapular Mobilization  in right sidelying, left scapular mobilization in protraction; unable to bring medial scapula away from rib cage   Passive ROM left shoulder into IR, ER, abduction, and flexion; also tried to mimic radiation position with left shoulder in abduction with ER                   Short Term Clinic Goals - 10/13/14 1426    CC Short Term Goal  #1   Title short term goals = long term goals             Long Term Clinic Goals - 10/16/14 1413    CC Long Term Goal  #1   Title pt will  have left shoulder abduction of  110 degrees to allow her to receive radiaton therapy   Time 4   Period Weeks   Status On-going   CC Long Term Goal  #  2   Title pt will report a 50% decrease in pain in left shoulder to allow her to do home exercise to improve shoulder range of motion   Time 4   Period Weeks   Status On-going   CC Long Term Goal  #3   Title pt will be independent in home exercise program for ROM and general strength   Time 4   Period Weeks   Status On-going   CC Long Term Goal  #4   Title Decrease Quick Dash Score to 55   Time 4   Period Weeks   Status On-going            Plan - 10/23/14 1250    Clinical Impression Statement Pt. with limited tolerance of left shoulder ROM due to pain; she may have difficulty getting into position for radiation, which she is scheduled to have simulation for tomorrow.   PT Treatment/Interventions Therapeutic exercise;Passive range of motion   PT Next Visit Plan Reassess.  Continue with active exercise and manual techniques.   PT Home  Exercise Plan Do exercise within tolerance, trying to stretch but not aggravate pain.   Consulted and Agree with Plan of Care Patient        Problem List Patient Active Problem List   Diagnosis Date Noted  . Chemotherapy induced neutropenia 07/31/2014  . Neutropenia 07/03/2014  . Anemia 06/12/2014  . Hypokalemia 05/29/2014  . Watery eyes 05/09/2014  . Malignant neoplasm of colon, unspecified site 04/03/2014  . Family history of malignant neoplasm of breast 04/03/2014  . Breast cancer of upper-outer quadrant of left female breast 03/01/2014    SALISBURY,DONNA 10/23/2014, 12:53 PM  [place], [place], [address] Phone: [phone]   Fax:  [phone]  Serafina Royals, [place]

## 2014-10-24 ENCOUNTER — Ambulatory Visit: Admission: RE | Admit: 2014-10-24 | Payer: Medicare Other | Source: Ambulatory Visit | Admitting: Radiation Oncology

## 2014-10-24 NOTE — Progress Notes (Signed)
  Radiation Oncology         [phone] ________________________________  Name: [name] MRN: [id number]  Date: 10/24/2014  DOB: [birth year]  SIMULATION AND TREATMENT PLANNING NOTE    ICD-9-CM ICD-10-CM   1. Malignant neoplasm of female breast, left 174.9 C50.912   2. Breast cancer of upper-outer quadrant of left female breast 174.4 C50.412       NARRATIVE: Today the patient began simulation for treatments to the left breast and local regional area.  Due to shoulder mobility issues the patient was not able to be placed in a reasonable position for her radiation therapy. She is undergoing physical therapy 3 times a week at this time and she will be rescheduled for simulation 2 weeks from today after she completes her aggressive physical therapy.  PLAN:  Simulation rescheduled for February 23  ________________________________  -----------------------------------  Blair Promise, PhD, MD

## 2014-10-25 ENCOUNTER — Ambulatory Visit: Payer: Medicare Other

## 2014-10-27 ENCOUNTER — Other Ambulatory Visit (HOSPITAL_BASED_OUTPATIENT_CLINIC_OR_DEPARTMENT_OTHER): Payer: Medicare Other

## 2014-10-27 ENCOUNTER — Telehealth: Payer: Self-pay | Admitting: Oncology

## 2014-10-27 ENCOUNTER — Ambulatory Visit: Payer: Medicare Other

## 2014-10-27 ENCOUNTER — Ambulatory Visit (HOSPITAL_BASED_OUTPATIENT_CLINIC_OR_DEPARTMENT_OTHER): Payer: Medicare Other | Admitting: Oncology

## 2014-10-27 VITALS — BP 147/87 | HR 109 | Temp 98.4°F | Resp 18 | Ht 61.0 in | Wt 229.7 lb

## 2014-10-27 DIAGNOSIS — E041 Nontoxic single thyroid nodule: Secondary | ICD-10-CM

## 2014-10-27 DIAGNOSIS — E876 Hypokalemia: Secondary | ICD-10-CM

## 2014-10-27 DIAGNOSIS — C50812 Malignant neoplasm of overlapping sites of left female breast: Secondary | ICD-10-CM

## 2014-10-27 DIAGNOSIS — C50412 Malignant neoplasm of upper-outer quadrant of left female breast: Secondary | ICD-10-CM

## 2014-10-27 DIAGNOSIS — Z171 Estrogen receptor negative status [ER-]: Secondary | ICD-10-CM

## 2014-10-27 DIAGNOSIS — C189 Malignant neoplasm of colon, unspecified: Secondary | ICD-10-CM

## 2014-10-27 DIAGNOSIS — R911 Solitary pulmonary nodule: Secondary | ICD-10-CM | POA: Diagnosis not present

## 2014-10-27 DIAGNOSIS — C773 Secondary and unspecified malignant neoplasm of axilla and upper limb lymph nodes: Secondary | ICD-10-CM

## 2014-10-27 DIAGNOSIS — T451X5A Adverse effect of antineoplastic and immunosuppressive drugs, initial encounter: Secondary | ICD-10-CM

## 2014-10-27 DIAGNOSIS — D630 Anemia in neoplastic disease: Secondary | ICD-10-CM

## 2014-10-27 DIAGNOSIS — D701 Agranulocytosis secondary to cancer chemotherapy: Secondary | ICD-10-CM

## 2014-10-27 DIAGNOSIS — Z803 Family history of malignant neoplasm of breast: Secondary | ICD-10-CM

## 2014-10-27 DIAGNOSIS — H04203 Unspecified epiphora, bilateral lacrimal glands: Secondary | ICD-10-CM

## 2014-10-27 DIAGNOSIS — Z95828 Presence of other vascular implants and grafts: Secondary | ICD-10-CM

## 2014-10-27 LAB — CBC WITH DIFFERENTIAL/PLATELET
BASO%: 0.6 % (ref 0.0–2.0)
Basophils Absolute: 0 10*3/uL (ref 0.0–0.1)
EOS ABS: 0.1 10*3/uL (ref 0.0–0.5)
EOS%: 1.9 % (ref 0.0–7.0)
HEMATOCRIT: 35.1 % (ref 34.8–46.6)
HGB: 11.2 g/dL — ABNORMAL LOW (ref 11.6–15.9)
LYMPH%: 23.5 % (ref 14.0–49.7)
MCH: 29.3 pg (ref 25.1–34.0)
MCHC: 31.9 g/dL (ref 31.5–36.0)
MCV: 91.6 fL (ref 79.5–101.0)
MONO#: 0.5 10*3/uL (ref 0.1–0.9)
MONO%: 10 % (ref 0.0–14.0)
NEUT#: 3.1 10*3/uL (ref 1.5–6.5)
NEUT%: 64 % (ref 38.4–76.8)
PLATELETS: 243 10*3/uL (ref 145–400)
RBC: 3.83 10*6/uL (ref 3.70–5.45)
RDW: 14.4 % (ref 11.2–14.5)
WBC: 4.9 10*3/uL (ref 3.9–10.3)
lymph#: 1.2 10*3/uL (ref 0.9–3.3)

## 2014-10-27 LAB — COMPREHENSIVE METABOLIC PANEL (CC13)
ALT: 21 U/L (ref 0–55)
AST: 19 U/L (ref 5–34)
Albumin: 3.5 g/dL (ref 3.5–5.0)
Alkaline Phosphatase: 133 U/L (ref 40–150)
Anion Gap: 10 mEq/L (ref 3–11)
BUN: 10.8 mg/dL (ref 7.0–26.0)
CO2: 23 mEq/L (ref 22–29)
Calcium: 9.6 mg/dL (ref 8.4–10.4)
Chloride: 109 mEq/L (ref 98–109)
Creatinine: 0.9 mg/dL (ref 0.6–1.1)
EGFR: 74 mL/min/{1.73_m2} — ABNORMAL LOW (ref >90)
Glucose: 96 mg/dl (ref 70–140)
Potassium: 3.7 mEq/L (ref 3.5–5.1)
Sodium: 142 mEq/L (ref 136–145)
Total Bilirubin: 1.28 mg/dL — ABNORMAL HIGH (ref 0.20–1.20)
Total Protein: 6.8 g/dL (ref 6.4–8.3)

## 2014-10-27 MED ORDER — GABAPENTIN 300 MG PO CAPS: 300.0000 mg | ORAL_CAPSULE | Freq: Every day | ORAL | Status: DC

## 2014-10-27 MED ORDER — SODIUM CHLORIDE 0.9 % IJ SOLN
10.0000 mL | INTRAMUSCULAR | Status: DC | PRN
  Administered 2014-10-27: 10 mL via INTRAVENOUS
  Filled 2014-10-27: qty 10

## 2014-10-27 MED ORDER — HEPARIN SOD (PORK) LOCK FLUSH 100 UNIT/ML IV SOLN
500.0000 [IU] | Freq: Once | INTRAVENOUS | Status: AC
  Administered 2014-10-27: 500 [IU] via INTRAVENOUS
  Filled 2014-10-27: qty 5

## 2014-10-27 NOTE — Telephone Encounter (Signed)
, °

## 2014-10-27 NOTE — Progress Notes (Signed)
[place]  [phone] [phone]     ID: [name] DOB: [birth year]  MR#: [id number]  UGA#:[id number]  PCP: Maximino Greenland, MD GYN: SU: Rolm Bookbinder OTHER MD: Gery Pray  CHIEF COMPLAINT: Stage III breast cancer, triple negative   CURRENT TREATMENT: awaiting adjuvant radiation  BREAST CANCER HISTORY: From the original intake note:  [name] herself palpated a mass in her left breast, and immediately brought it to the attention of her primary care physician, Dr. Baird Cancer, who confirmed the finding and setup the patient for bilateral diagnostic mammography at the breast Center 02/20/2014. This showed a high density mass in the outer left breast measuring up to 3.7 cm. It corresponded to the site of palpable concern. In addition the normal 4 logically abnormal lymph nodes in the left axilla. The mass was palpable by exam, and by ultrasonography measured 2.6 cm. There were enlarged and morphologically abnormal lymph nodes in the left axilla, largest measuring 3 cm.  Biopsy of the left breast mass in question and 1 of the abnormal axillary lymph nodes 02/22/2014 showed ([id number]) biopsies to be positive for an invasive ductal carcinoma, grade 3, triple negative, with an MIB-1 of 90%. Specifically the HER-2 signals ratio was 1.05, and the number per cell 2.00.  MRI of the breasts 03/01/2014 showed a 3.8 cm enhancing mass with areas of apparent necrosis in the left breast, as well as the biopsy tract extending laterally, the combined measure 5.3 cm. There were no other masses or areas of abnormal enhancement. There were multiple abnormally enlarged left axillary lymph nodes with loss of the normal fatty hilum. These included level I and retropectoral lymph nodes.  The patient's subsequent history is as detailed below   INTERVAL HISTORY: [name] returns today for follow up of her breast cancer. Since her last visit here she underwent definitive  surgery with left lumpectomy and sentinel lymph node sampling 09/20/2014. The final pathology ([id number]) showed no evidence of malignancy in the left lumpectomy specimen. All 3 sentinel lymph nodes were clear. Margins were negative. Brinsley did well with her surgery, without significant complaints regarding pain, fever, or bleeding.  REVIEW OF SYSTEMS: [name] is "trying to get back to normal". Her hair is growing back. She has no nausea, vomiting, visual changes, dizziness, or gait imbalance. She denies cough or phlegm production. This been no change in bowel or bladder habits. A detailed review of systems today was otherwise stable.   PAST MEDICAL HISTORY: Past Medical History  Diagnosis Date  . Hx of rotator cuff surgery   . Colon cancer   . left breast cancer     PAST SURGICAL HISTORY: Past Surgical History  Procedure Laterality Date  . Abdominal hysterectomy  1988  . Colon surgery  1988    colectomy/colostomy-ca  . Colon surgery  1988    colostomy takedown-reversal  . Colonoscopy    . Total shoulder arthroplasty  2009    right  . Radioactive seed guided mastectomy with axillary sentinel lymph node biopsy Left 09/20/2014    Procedure: RADIOACTIVE SEED GUIDED LEFT BREAST LUMPECTOMY WITH AXILLARY SENTINEL LYMPH NODE BIOPSY;  Surgeon: Rolm Bookbinder, MD;  Location: [facility];  Service: General;  Laterality: Left;    FAMILY HISTORY Family History  Problem Relation Age of Onset  . Cancer Mother 21    breast  . Cancer Maternal Aunt     breast cancer at unknown age   the patient's father died at the age of  38 following a stroke in the setting of diabetes; the patient's mother is living at 43. The patient has 3 brothers and 2 sisters. [name], [name], was diagnosed with breast cancer in her early 37s. There is no other breast or ovarian cancer in the family. The patient herself was diagnosed with watts by history he is an incidentally found stage I colon  carcinoma noted at the time of her hysterectomy. She had a partial colectomy but no adjuvant treatment. We have no records regarding that procedure  GYNECOLOGIC HISTORY:  No LMP recorded. Patient has had a hysterectomy. Menarche age 55, first live birth age 16, the patient underwent total abdominal hysterectomy with bilateral salpingo-oophorectomy in her 77s. She did not take hormone replacement. She did not take oral contraceptives at any point.  SOCIAL HISTORY:  [name] work for [employer] for about 40 years, retiring in 2008. She is divorced and lives alone, with no pets. Her daughter Josephyne Tarter works for [employer] in Press [employer]. The second daughter, Kanon Colunga, works as a Research scientist (physical sciences) for [employer]. The patient has 6 grandchildren and one great-grandchild. She attends a [employer].    ADVANCED DIRECTIVES: Not in place. The patient intends to name her daughter [name]. her healthcare power of attorney. [name] is work number is [id number]. On the patient's 03/01/2014 visit she was given a copy of the appropriate documents to complete and notarize at her discretion   HEALTH MAINTENANCE: History  Substance Use Topics  . Smoking status: Never Smoker   . Smokeless tobacco: Not on file  . Alcohol Use: Yes     Comment: occ-rare     Colonoscopy: 2000  PAP: May 2015  Bone density: 05/23/2004, at [facility] hospital; normal  Lipid panel:  No Known Allergies  Current Outpatient Prescriptions  Medication Sig Dispense Refill  . acetaminophen (TYLENOL) 500 MG tablet Take 1,000 mg by mouth every 6 (six) hours as needed for mild pain or moderate pain.    Marland Kitchen dexamethasone (DECADRON) 4 MG tablet Take 2 tablets by mouth once a day on the day after chemotherapy and then take 2 tablets two times a day for 2 days. Take with food. (Patient not taking: Reported on 10/04/2014) 30 tablet 1  . lidocaine-prilocaine (EMLA) cream Apply 1 application topically once. Apply to port 1-2 hours prior to  access. 30 g 1  . loteprednol (LOTEMAX) 0.5 % ophthalmic suspension Place 1 drop into both eyes 3 (three) times daily.    . ondansetron (ZOFRAN) 8 MG tablet Take 1 tablet (8 mg total) by mouth 2 (two) times daily as needed. Start on the third day after chemotherapy. (Patient not taking: Reported on 10/04/2014) 30 tablet 0  . oxyCODONE-acetaminophen (PERCOCET) 10-325 MG per tablet Take 1 tablet by mouth every 6 (six) hours as needed for pain. 20 tablet 0  . potassium chloride (K-DUR) 10 MEQ tablet Take 1 tablet (10 mEq total) by mouth 2 (two) times daily. 60 tablet 4  . prochlorperazine (COMPAZINE) 10 MG tablet Take 1 tablet (10 mg total) by mouth every 6 (six) hours as needed for nausea or vomiting. (Patient not taking: Reported on 08/14/2014) 30 tablet 0  . tobramycin-dexamethasone (TOBRADEX) ophthalmic solution Place 1 drop into both eyes every 4 (four) hours while awake. 5 mL 0   No current facility-administered medications for this visit.    OBJECTIVE: Middle-aged [place] American woman in no acute distress Filed Vitals:   10/27/14 1204  BP: 147/87  Pulse: 109  Temp:  98.4 F (36.9 C)  Resp: 18     Body mass index is 43.42 kg/(m^2).    ECOG FS:1 - Symptomatic but completely ambulatory  Sclerae unicteric, pupils equal and reactive Oropharynx clear and moist No cervical or supraclavicular adenopathy Lungs no rales or rhonchi Heart regular rate and rhythm Abd soft, nontender, positive bowel sounds MSK no focal spinal tenderness, no upper extremity lymphedema Neuro: nonfocal, well oriented, appropriate affect Breasts: The right breast is unremarkable. The left breast is status post recent lumpectomy and sentinel lymph node sampling. There is no evidence of dehiscence, erythema, or swelling. There are no skin or nipple changes of concern. The left axilla is benign.    LAB RESULTS:  No results found for: SPEP  Lab Results  Component Value Date   WBC 4.9 10/27/2014   NEUTROABS 3.1  10/27/2014   HGB 11.2* 10/27/2014   HCT 35.1 10/27/2014   MCV 91.6 10/27/2014   PLT 243 10/27/2014      Chemistry      Component Value Date/Time   NA 142 10/27/2014 1134   NA 140 09/19/2014 1325   K 3.7 10/27/2014 1134   K 3.7 09/19/2014 1325   CL 107 09/19/2014 1325   CO2 23 10/27/2014 1134   CO2 24 09/19/2014 1325   BUN 10.8 10/27/2014 1134   BUN <5* 09/19/2014 1325   CREATININE 0.9 10/27/2014 1134   CREATININE 0.81 09/19/2014 1325      Component Value Date/Time   CALCIUM 9.6 10/27/2014 1134   CALCIUM 9.7 09/19/2014 1325   ALKPHOS 133 10/27/2014 1134   ALKPHOS 121* 04/24/2014 0958   AST 19 10/27/2014 1134   AST 13 04/24/2014 0958   ALT 21 10/27/2014 1134   ALT 25 04/24/2014 0958   BILITOT 1.28* 10/27/2014 1134   BILITOT 0.3 04/24/2014 0958       No results found for: LABCA2  No components found for: LABCA125  No results for input(s): INR in the last 168 hours.  Urinalysis    Component Value Date/Time   COLORURINE YELLOW 04/05/2008 1120   APPEARANCEUR CLEAR 04/05/2008 1120   LABSPEC 1.021 04/05/2008 1120   PHURINE 6.0 04/05/2008 1120   GLUCOSEU NEGATIVE 04/05/2008 1120   HGBUR NEGATIVE 04/05/2008 1120   BILIRUBINUR NEGATIVE 04/05/2008 1120   KETONESUR NEGATIVE 04/05/2008 1120   PROTEINUR NEGATIVE 04/05/2008 1120   UROBILINOGEN 0.2 04/05/2008 1120   NITRITE NEGATIVE 04/05/2008 1120   LEUKOCYTESUR  04/05/2008 1120    NEGATIVE MICROSCOPIC NOT DONE ON URINES WITH NEGATIVE PROTEIN, BLOOD, LEUKOCYTES, NITRITE, OR GLUCOSE <1000 mg/dL.    STUDIES: No results found.  ASSESSMENT: 64 y.o. BRCA negative [place] woman s/p breast and left axillary lymph node biopsy 02/22/2014, both positive for a clinical T2 N2, stage IIIA invasive ductal carcinoma, grade 3, triple negative, with an MIB-1 of 90%  (1) neoadjuvant chemotherapy started 03/13/2014, with cyclophosphamide and doxorubicin in dose dense fashion x4, with Neulasta support on day 2, completed 04/24/2014,  to be followed by weekly carboplatin and paclitaxel x12, paclitaxel reduced during cycle 5 and cycle 7 because of elevated bilirubin levels  (2) d left lumpectomy and sentinel lymph node sampling 09/20/2014 showed a complete pathologic response.  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  (b) PALB2 mutations are known to increase the risk of breast and pancreatic cancer, possibly other cancers, but the data is preliminary  (6) staging studies showed right-sided lung nodules, too small to characterize, and a dominant right-sided thyroid nodule unchanged in size as compared to 2011; to be followed  PLAN: Thamar did well with her surgery and obtained the best possible result, namely a complete pathologic response. This means her prognosis is much better than one would otherwise expect from a stage III breast cancer. I was delighted for her.  She is now ready to proceed to adjuvant radiation. When she completes that she will return to see me and at that point we will initiate long-term follow-up.  Some time in the fall of this year we will repeat 1 more CT scan of the chest and if there has been no change in her lung nodules we will not need to continue to follow those. At that time we will also consider ultrasound plus or minus biopsy of her thyroid mass, which however has been stable since 2011.  [place] has a good understanding of the overall plan. She agrees with it. She knows the goal of treatment in her case is cure. She will call with any problems that may develop before her next visit here. Chauncey Cruel, MD   10/27/2014 12:28 PM

## 2014-10-30 ENCOUNTER — Ambulatory Visit: Payer: Medicare Other | Admitting: Physical Therapy

## 2014-10-31 ENCOUNTER — Ambulatory Visit: Payer: Medicare Other | Admitting: Radiation Oncology

## 2014-11-01 ENCOUNTER — Ambulatory Visit: Payer: Medicare Other

## 2014-11-01 ENCOUNTER — Ambulatory Visit: Payer: Medicare Other | Admitting: Physical Therapy

## 2014-11-01 DIAGNOSIS — R531 Weakness: Secondary | ICD-10-CM | POA: Diagnosis not present

## 2014-11-01 DIAGNOSIS — M25512 Pain in left shoulder: Secondary | ICD-10-CM | POA: Diagnosis not present

## 2014-11-01 DIAGNOSIS — M25612 Stiffness of left shoulder, not elsewhere classified: Secondary | ICD-10-CM | POA: Diagnosis not present

## 2014-11-01 NOTE — Therapy (Signed)
[place], [place], [address] Phone: [phone]   Fax:  [phone]  Physical Therapy Treatment  Patient Details  Name: [name] MRN: [id number] Date of Birth: May 17, 1951 Referring Provider:  Glendale Chard, MD  Encounter Date: 11/01/2014      PT End of Session - 11/01/14 1146    Visit Number 5   Number of Visits 13   Date for PT Re-Evaluation 11/13/14      Past Medical History  Diagnosis Date  . Hx of rotator cuff surgery   . Colon cancer   . left breast cancer     Past Surgical History  Procedure Laterality Date  . Abdominal hysterectomy  1988  . Colon surgery  1988    colectomy/colostomy-ca  . Colon surgery  1988    colostomy takedown-reversal  . Colonoscopy    . Total shoulder arthroplasty  2009    right  . Radioactive seed guided mastectomy with axillary sentinel lymph node biopsy Left 09/20/2014    Procedure: RADIOACTIVE SEED GUIDED LEFT BREAST LUMPECTOMY WITH AXILLARY SENTINEL LYMPH NODE BIOPSY;  Surgeon: Rolm Bookbinder, MD;  Location: [facility];  Service: General;  Laterality: Left;    There were no vitals taken for this visit.  Visit Diagnosis:  Stiffness of shoulder joint, left  Weakness generalized  Pain in joint, shoulder region, left      Subjective Assessment - 11/01/14 1107    Symptoms "Its almost there, but it  needs a little more"  Pt states she needs more ROM to be able to do radiatoin.  She will be going back on the 23rd to try again . she also started on gabapentin to help with the numbness in her hands an fee.    Currently in Pain? Yes   Pain Score 2   It stays that way   Pain Location Shoulder   Pain Orientation Right   Pain Descriptors / Indicators Sharp  "light "   Pain Type Acute pain                    [facility] Adult PT Treatment/Exercise - 11/01/14 0001    Neck Exercises: Seated   Neck Retraction 1 rep    Cervical Rotation Both   Lateral Flexion Both   Shoulder Rolls Backwards   Other Seated Exercise neural glide    Lumbar Exercises: Supine   Ab Set --   Glut Set --   Bent Knee Raise --   Other Supine Lumbar Exercises lower trunk rotation  with arms out in abduction   Knee/Hip Exercises: Standing   Functional Squat 10 reps  power up sit to stand from high surface   Shoulder Exercises: Pulleys   Flexion 2 minutes   ABduction 2 minutes   Shoulder Exercises: ROM/Strengthening   Ball on Wall both hands on wall for flexion, horizontal adduction, horizontal abduction   Shoulder Exercises: Stretch   Table Stretch - Flexion 2 reps  3 minutes   Table Stretch - Abduction 2 reps  3 minutes   Other Shoulder Stretches --   Manual Therapy   Joint Mobilization to [facility] joint and glenohumeral joing contract/relax with stretch into external rotation.   Myofascial Release crosshands techniques at anterior shoulder and lateral chest    Passive ROM left shoulder in diagonal and conventional plances    Neck Exercises: Stretches   Neck Stretch 3 reps   Lower Cervical/Upper Thoracic Stretch 3 reps  Short Term Clinic Goals - 10/13/14 1426    CC Short Term Goal  #1   Title short term goals = long term goals             Long Term Clinic Goals - 10/16/14 1413    CC Long Term Goal  #1   Title pt will  have left shoulder abduction of  110 degrees to allow her to receive radiaton therapy   Time 4   Period Weeks   Status On-going   CC Long Term Goal  #2   Title pt will report a 50% decrease in pain in left shoulder to allow her to do home exercise to improve shoulder range of motion   Time 4   Period Weeks   Status On-going   CC Long Term Goal  #3   Title pt will be independent in home exercise program for ROM and general strength   Time 4   Period Weeks   Status On-going   CC Long Term Goal  #4   Title Decrease Quick Dash Score to 55   Time 4   Period Weeks    Status On-going            Plan - 11/01/14 1201    Clinical Impression Statement  Pt continues to have pain and decrased range of motoin that is prohibiting radiation treatment at this time.  She reports it is looser than it was and that she is working with it more at home.  She feels that she will be able to do the passive table stretches without much difficulty at home.    PT Next Visit Plan Remeasure ROM continue with exercise and manual techniques. Assess effectiveness of table stretches.        Problem List Patient Active Problem List   Diagnosis Date Noted  . Chemotherapy induced neutropenia 07/31/2014  . Anemia in neoplastic disease 06/12/2014  . Hypokalemia 05/29/2014  . Watery eyes 05/09/2014  . Malignant neoplasm of colon 04/03/2014  . Family history of malignant neoplasm of breast 04/03/2014  . Breast cancer of upper-outer quadrant of left female breast 03/01/2014   Donato Heinz. Owens Shark, PT   11/01/2014, 12:10 PM  [place], [place], [address] Phone: [phone]   Fax:  [phone]

## 2014-11-01 NOTE — Patient Instructions (Signed)
Flexion (Passive)   Sitting upright, slide forearm forward along table, bending from the waist until a stretch is felt. Hold 3 minutes Repeat __3__ times. Copyright  VHI. All rights reserved.  Abduction (Passive)   With arm out to side, resting on table, lower head toward arm, keeping trunk away from table. Hold 3 minutes  Repeat __3__ times. KEEP PALM UP!  Copyright  VHI. All rights reserved.  Use pillows to support your head and neck as necessary.  Breathe Deep Try to relax into the strech

## 2014-11-02 ENCOUNTER — Ambulatory Visit: Payer: Medicare Other

## 2014-11-03 ENCOUNTER — Ambulatory Visit: Payer: Medicare Other

## 2014-11-03 ENCOUNTER — Ambulatory Visit: Payer: Medicare Other | Admitting: Physical Therapy

## 2014-11-03 DIAGNOSIS — M25512 Pain in left shoulder: Secondary | ICD-10-CM | POA: Diagnosis not present

## 2014-11-03 DIAGNOSIS — R531 Weakness: Secondary | ICD-10-CM | POA: Diagnosis not present

## 2014-11-03 DIAGNOSIS — M25612 Stiffness of left shoulder, not elsewhere classified: Secondary | ICD-10-CM

## 2014-11-03 NOTE — Therapy (Signed)
[place], [place], [address] Phone: [phone]   Fax:  [phone]  Physical Therapy Treatment  Patient Details  Name: [name] MRN: [id number] Date of Birth: 1951-09-03 Referring Provider:  Glendale Chard, MD  Encounter Date: 11/03/2014      PT End of Session - 11/03/14 1322    Visit Number 7   Number of Visits 13   Date for PT Re-Evaluation 11/13/14   PT Start Time 6962   PT Stop Time 1105   PT Time Calculation (min) 47 min      Past Medical History  Diagnosis Date  . Hx of rotator cuff surgery   . Colon cancer   . left breast cancer     Past Surgical History  Procedure Laterality Date  . Abdominal hysterectomy  1988  . Colon surgery  1988    colectomy/colostomy-ca  . Colon surgery  1988    colostomy takedown-reversal  . Colonoscopy    . Total shoulder arthroplasty  2009    right  . Radioactive seed guided mastectomy with axillary sentinel lymph node biopsy Left 09/20/2014    Procedure: RADIOACTIVE SEED GUIDED LEFT BREAST LUMPECTOMY WITH AXILLARY SENTINEL LYMPH NODE BIOPSY;  Surgeon: Rolm Bookbinder, MD;  Location: [facility];  Service: General;  Laterality: Left;    There were no vitals taken for this visit.  Visit Diagnosis:  Stiffness of shoulder joint, left  Weakness generalized  Pain in joint, shoulder region, left      Subjective Assessment - 11/03/14 1025    Symptoms I did the stretches 3 times yesterday  Pt reports she is working hard to gain her range of motion because she wants to get the radiation over with   Currently in Pain? Yes   Pain Score 2    Pain Location Shoulder   Pain Orientation Left   Pain Descriptors / Indicators [facility] Adult PT Treatment/Exercise - 11/03/14 1027    Neck Exercises: Seated   Neck Retraction 1 rep   Cervical Rotation Both   Lateral Flexion Both   Shoulder Rolls  Backwards   Other Seated Exercise neural glide    Lumbar Exercises: Supine   Glut Set 5 reps   Other Supine Lumbar Exercises lower trunk rotation  with arms out in abduction   Other Supine Lumbar Exercises pelvic tilts   Knee/Hip Exercises: Standing   Functional Squat 10 reps  power up sit to stand from high surface   Shoulder Exercises: Standing   External Rotation AAROM   External Rotation Limitations severly limited by pain stretched at wall   Internal Rotation AAROM;10 reps  dowel   Flexion AAROM;10 reps  dowel   ABduction AAROM;10 reps  dowel   Extension AAROM;10 reps  dowel   Shoulder Exercises: Pulleys   Flexion 2 minutes   ABduction 2 minutes   Shoulder Exercises: ROM/Strengthening   "W" Arms 5 repa   Ball on Wall both hands on wall for flexion, horizontal adduction, horizontal abduction   Shoulder Exercises: Stretch   Table Stretch - Flexion --   Table Stretch - Abduction --   Manual Therapy   Myofascial Release pressure and myofascial release at anterior chest. lateral chest and axilla   Passive ROM left shoulder with gentle stretch at end range   Neck Exercises: Stretches   Neck Stretch 3 reps  Lower Cervical/Upper Thoracic Stretch 3 reps                PT Education - 11/03/14 1322    Education provided Yes   Education Details standing dowel shoulder exercise   Person(s) Educated Patient   Methods Explanation;Demonstration;Handout   Comprehension Returned demonstration           Short Term Clinic Goals - 10/13/14 1426    CC Short Term Goal  #1   Title short term goals = long term goals             Long Term Clinic Goals - 11/03/14 1328    CC Long Term Goal  #1   Title pt will  have left shoulder abduction of  110 degrees to allow her to receive radiaton therapy   Time 4   Status On-going   CC Long Term Goal  #2   Title pt will report a 50% decrease in pain in left shoulder to allow her to do home exercise to improve shoulder  range of motion   Status On-going   CC Long Term Goal  #3   Title pt will be independent in home exercise program for ROM and general strength   Status On-going   CC Long Term Goal  #4   Title Decrease Quick Dash Score to 55   Status On-going            Plan - 11/03/14 1325    Clinical Impression Statement Pt states she is able to tolerate the table exercise at home She continues to be limited in external rotation and abduction which may be due an internal derangement at shoulder. She wants to continue working through the pain so she can get her radiation treatment   PT Next Visit Plan Remeasure ROM continue with exercise and manual techniques.        Problem List Patient Active Problem List   Diagnosis Date Noted  . Chemotherapy induced neutropenia 07/31/2014  . Anemia in neoplastic disease 06/12/2014  . Hypokalemia 05/29/2014  . Watery eyes 05/09/2014  . Malignant neoplasm of colon 04/03/2014  . Family history of malignant neoplasm of breast 04/03/2014  . Breast cancer of upper-outer quadrant of left female breast 03/01/2014   Donato Heinz. Owens Shark, PT   11/03/2014, 1:30 PM  [place], [place], [address] Phone: [phone]   Fax:  [phone]

## 2014-11-03 NOTE — Patient Instructions (Signed)
Flexion (Eccentric) - Active-Assist (Cane)   Use unaffected arm to push affected arm forward. Avoid hiking shoulder. Keep palm relaxed. Slowly lower affected arm for 3-5 seconds, increasing use of affected arm. 10___ reps per set  Copyright  VHI. All rights reserved  Cane Exercise: Abduction   Hold cane with left  hand over end, palm-up, with other hand palm-down. Move arm out from side and up by pushing with other arm. Hold _2___ seconds. Repeat _10___ times.  http://[email or web address].[email or web address]   Copyright  VHI. All rights reserved.      Cane Exercise: Extension / Internal Rotation   Stand holding cane behind back with both hands palm-up. Slide cane up spine toward head. Hold __2__ seconds. Repeat _10___ times.   http://[email or web address].[email or web address]   Copyright  VHI. All rights reserved.  Kasandra Knudsen Exercise: Extension   Stand holding cane behind back with both hands palm-up. Lift the cane away from body. Hold _2___ seconds. Repeat __10__ times.   http://[email or web address].[email or web address]   Copyright  VHI. All rights reserved.

## 2014-11-06 ENCOUNTER — Ambulatory Visit: Payer: Medicare Other | Admitting: Physical Therapy

## 2014-11-06 ENCOUNTER — Ambulatory Visit: Payer: Medicare Other

## 2014-11-06 DIAGNOSIS — R531 Weakness: Secondary | ICD-10-CM | POA: Diagnosis not present

## 2014-11-06 DIAGNOSIS — M25612 Stiffness of left shoulder, not elsewhere classified: Secondary | ICD-10-CM

## 2014-11-06 DIAGNOSIS — M25512 Pain in left shoulder: Secondary | ICD-10-CM

## 2014-11-06 NOTE — Therapy (Addendum)
[place], [place], [address] Phone: [phone]   Fax:  [phone]  Physical Therapy Treatment  Patient Details  Name: [name] MRN: [id number] Date of Birth: 1950-12-02 Referring Provider:  Glendale Chard, MD  Encounter Date: 11/06/2014      PT End of Session - 11/06/14 1240    Visit Number 8   Number of Visits 13   Date for PT Re-Evaluation 11/13/14   PT Start Time 1016   PT Stop Time 1100   PT Time Calculation (min) 44 min      Past Medical History  Diagnosis Date  . Hx of rotator cuff surgery   . Colon cancer   . left breast cancer     Past Surgical History  Procedure Laterality Date  . Abdominal hysterectomy  1988  . Colon surgery  1988    colectomy/colostomy-ca  . Colon surgery  1988    colostomy takedown-reversal  . Colonoscopy    . Total shoulder arthroplasty  2009    right  . Radioactive seed guided mastectomy with axillary sentinel lymph node biopsy Left 09/20/2014    Procedure: RADIOACTIVE SEED GUIDED LEFT BREAST LUMPECTOMY WITH AXILLARY SENTINEL LYMPH NODE BIOPSY;  Surgeon: Rolm Bookbinder, MD;  Location: [facility];  Service: General;  Laterality: Left;    There were no vitals taken for this visit.  Visit Diagnosis:  Stiffness of shoulder joint, left  Pain in joint, shoulder region, left      Subjective Assessment - 11/06/14 1025    Symptoms "it never stops hurting" Pt reports she was stretching her arm all weekend so that when she goes tomorrow she can do the simulation,.   Currently in Pain? Yes   Pain Score 2    Pain Location Shoulder   Pain Orientation Left          [facility] PT Assessment - 11/06/14 1103    AROM   Left Shoulder Flexion 143 Degrees   Left Shoulder ABduction 125 Degrees  with shoulder hiking                  [facility] Adult PT Treatment/Exercise - 11/06/14 0001    Neck Exercises: Seated   Neck Retraction 1  rep   Cervical Rotation Both   Lateral Flexion Both   Shoulder Rolls Backwards   Lumbar Exercises: Supine   Other Supine Lumbar Exercises lower trunk rotation  with arms out in abduction   Other Supine Lumbar Exercises pelvic tilts   Shoulder Exercises: Standing   External Rotation AAROM   External Rotation Limitations severly limited by pain stretched at wall   Internal Rotation AAROM;10 reps  dowel   Flexion AAROM;10 reps  dowel   ABduction AAROM;10 reps  dowel   Extension AAROM;10 reps  dowel   Shoulder Exercises: Pulleys   Flexion 2 minutes   ABduction 2 minutes   Shoulder Exercises: ROM/Strengthening   Other ROM/Strengthening Exercises upper thoracic extension exercise   Manual Therapy   Joint Mobilization to [facility] joint, scapula   Myofascial Release pressure releases to left pec major muscles with some relesases felt , also myofascial pulling to left arm   Passive ROM to left shoulder with contract/relax tecniiques at end range   Neck Exercises: Stretches   Neck Stretch 3 reps   Lower Cervical/Upper Thoracic Stretch 3 reps                   Short Term Clinic  Goals - 11/06/14 1245    CC Short Term Goal  #1   Title short term goals = long term goals             Long Term Clinic Goals - 11/06/14 1244    CC Long Term Goal  #1   Title pt will  have left shoulder abduction of  110 degrees to allow her to receive radiaton therapy   Time 4   Period Weeks   Status Achieved   CC Long Term Goal  #2   Title pt will report a 50% decrease in pain in left shoulder to allow her to do home exercise to improve shoulder range of motion   Time 4   Period Weeks   Status On-going   CC Long Term Goal  #3   Title pt will be independent in home exercise program for ROM and general strength   Time 4   Period Weeks   Status On-going   CC Long Term Goal  #4   Title Decrease Quick Dash Score to 55   Time 4   Period Weeks   Status On-going            Plan -  11/06/14 1241    Clinical Impression Statement Patiet has increased range of motion of left arm since she started but continues to have pain with decreases especially in external rotation. She is to go for simulation tomorrow.  If she is sill unable to achieve the needed position she may need to have an othopedic consultation for  other intervention.   PT Next Visit Plan continue with exercise and manual techniques.   Consulted and Agree with Plan of Care Patient        Problem List Patient Active Problem List   Diagnosis Date Noted  . Chemotherapy induced neutropenia 07/31/2014  . Anemia in neoplastic disease 06/12/2014  . Hypokalemia 05/29/2014  . Watery eyes 05/09/2014  . Malignant neoplasm of colon 04/03/2014  . Family history of malignant neoplasm of breast 04/03/2014  . Breast cancer of upper-outer quadrant of left female breast 03/01/2014   Donato Heinz. Owens Shark, PT   11/06/2014, 12:52 PM  [place], [place], [address] Phone: [phone]   Fax:  [phone]     ADDENDUM: 11/08/2014 Ms. Bratcher called yesterday to say that she was able to undergo her simulation and she does not think she needs to continue physical therapy. She will continue her home exercise and stretching to maintain her gains.  PHYSICAL THERAPY DISCHARGE SUMMARY  Visits from Start of Care: 8  Current functional level related to goals / functional outcomes: Pt is happy that she will be able to achieve the position needed for radiation treatment   Remaining deficits: Pain and decreased range of motion in left shoulder   Education / Equipment: Home stretching program  Plan: Patient agrees to discharge.  Patient goals were partially met. Patient is being discharged due to not returning since the last visit.  ?????  Donato Heinz. Owens Shark, PT 11/08/2014 9:51

## 2014-11-07 ENCOUNTER — Ambulatory Visit
Admission: RE | Admit: 2014-11-07 | Discharge: 2014-11-07 | Disposition: A | Discharge status: Home / Self Care | Payer: Medicare Other | Source: Ambulatory Visit | Attending: Radiation Oncology | Admitting: Radiation Oncology

## 2014-11-07 ENCOUNTER — Ambulatory Visit: Payer: Medicare Other

## 2014-11-07 DIAGNOSIS — C50412 Malignant neoplasm of upper-outer quadrant of left female breast: Secondary | ICD-10-CM

## 2014-11-07 DIAGNOSIS — Z171 Estrogen receptor negative status [ER-]: Secondary | ICD-10-CM | POA: Diagnosis not present

## 2014-11-07 DIAGNOSIS — Z510 Encounter for antineoplastic radiation therapy: Secondary | ICD-10-CM | POA: Diagnosis not present

## 2014-11-07 DIAGNOSIS — Z9012 Acquired absence of left breast and nipple: Secondary | ICD-10-CM | POA: Diagnosis not present

## 2014-11-07 DIAGNOSIS — Z90710 Acquired absence of both cervix and uterus: Secondary | ICD-10-CM | POA: Diagnosis not present

## 2014-11-07 NOTE — Progress Notes (Signed)
  Radiation Oncology         [phone] ________________________________  Name: [name] MRN: [id number]  Date: 11/07/2014  DOB: [birth year]  SIMULATION AND TREATMENT PLANNING NOTE    ICD-9-CM ICD-10-CM   1. Breast cancer of upper-outer quadrant of left female breast 174.4 C50.412     DIAGNOSIS:  Clinical stage III, high-grade triple negative left breast cancer (ypT0, ypN0)  NARRATIVE:  The patient was brought to the [facility].  Identity was confirmed.  All relevant records and images related to the planned course of therapy were reviewed.  The patient freely provided informed written consent to proceed with treatment after reviewing the details related to the planned course of therapy. The consent form was witnessed and verified by the simulation staff.  Then, the patient was set-up in a stable reproducible  supine position for radiation therapy.  CT images were obtained.  Surface markings were placed.  The CT images were loaded into the planning software.  Then the target and avoidance structures were contoured.  Treatment planning then occurred.  The radiation prescription was entered and confirmed.  Then, I designed and supervised the construction of a total of 5 medically necessary complex treatment devices.  I have requested : 3D Simulation  I have requested a DVH of the following structures: lungs, heart, lumpectomy cavity.  I have ordered:dose calc.  PLAN:  The patient will receive 45 Gy in 25 fractions using a 4 field set up with tangential beams directed at the left breast. The high axilla/supraclavicular region be treated with a left anterior oblique and PA field.  After 45 gray the patient will proceed with a boost directed at the lumpectomy cavity and continue to a cumulative dose of 61 Gy.  ________________________________  -----------------------------------  Blair Promise, PhD, MD

## 2014-11-07 NOTE — Progress Notes (Signed)
  Radiation Oncology         [phone] ________________________________  Name: [name] MRN: [id number]  Date: 11/07/2014  DOB: 07/15/1951  Optical Surface Tracking Plan:  Since intensity modulated radiotherapy (IMRT) and 3D conformal radiation treatment methods are predicated on accurate and precise positioning for treatment, intrafraction motion monitoring is medically necessary to ensure accurate and safe treatment delivery.  The ability to quantify intrafraction motion without excessive ionizing radiation dose can only be performed with optical surface tracking. Accordingly, surface imaging offers the opportunity to obtain 3D measurements of patient position throughout IMRT and 3D treatments without excessive radiation exposure.  I am ordering optical surface tracking for this patient's upcoming course of radiotherapy. ________________________________  Blair Promise, MD 11/07/2014 5:47 PM    Reference:   Particia Jasper, et al. Surface imaging-based analysis of intrafraction motion for breast radiotherapy patients.Journal of [place], n. 6, nov. 2014. ISSN [id number].   Available at: <http://www.jacmp.org/index.php/jacmp/article/view/4957>.

## 2014-11-08 ENCOUNTER — Ambulatory Visit: Payer: Medicare Other | Admitting: Physical Therapy

## 2014-11-08 ENCOUNTER — Ambulatory Visit: Payer: Medicare Other

## 2014-11-09 ENCOUNTER — Ambulatory Visit: Payer: Medicare Other

## 2014-11-09 DIAGNOSIS — Z171 Estrogen receptor negative status [ER-]: Secondary | ICD-10-CM | POA: Diagnosis not present

## 2014-11-09 DIAGNOSIS — Z90710 Acquired absence of both cervix and uterus: Secondary | ICD-10-CM | POA: Diagnosis not present

## 2014-11-09 DIAGNOSIS — Z510 Encounter for antineoplastic radiation therapy: Secondary | ICD-10-CM | POA: Diagnosis not present

## 2014-11-09 DIAGNOSIS — C50412 Malignant neoplasm of upper-outer quadrant of left female breast: Secondary | ICD-10-CM | POA: Diagnosis not present

## 2014-11-09 DIAGNOSIS — Z9012 Acquired absence of left breast and nipple: Secondary | ICD-10-CM | POA: Diagnosis not present

## 2014-11-10 ENCOUNTER — Ambulatory Visit: Payer: Medicare Other

## 2014-11-10 ENCOUNTER — Ambulatory Visit: Payer: Medicare Other | Admitting: Physical Therapy

## 2014-11-10 DIAGNOSIS — Z510 Encounter for antineoplastic radiation therapy: Secondary | ICD-10-CM | POA: Diagnosis not present

## 2014-11-10 DIAGNOSIS — Z171 Estrogen receptor negative status [ER-]: Secondary | ICD-10-CM | POA: Diagnosis not present

## 2014-11-10 DIAGNOSIS — Z9012 Acquired absence of left breast and nipple: Secondary | ICD-10-CM | POA: Diagnosis not present

## 2014-11-10 DIAGNOSIS — Z90710 Acquired absence of both cervix and uterus: Secondary | ICD-10-CM | POA: Diagnosis not present

## 2014-11-10 DIAGNOSIS — C50412 Malignant neoplasm of upper-outer quadrant of left female breast: Secondary | ICD-10-CM | POA: Diagnosis not present

## 2014-11-13 ENCOUNTER — Ambulatory Visit: Payer: Medicare Other

## 2014-11-13 ENCOUNTER — Ambulatory Visit: Payer: Medicare Other | Admitting: Physical Therapy

## 2014-11-14 ENCOUNTER — Ambulatory Visit
Admission: RE | Admit: 2014-11-14 | Discharge: 2014-11-14 | Disposition: A | Discharge status: Home / Self Care | Payer: Medicare Other | Source: Ambulatory Visit | Attending: Radiation Oncology | Admitting: Radiation Oncology

## 2014-11-14 ENCOUNTER — Ambulatory Visit: Payer: Medicare Other

## 2014-11-14 DIAGNOSIS — Z9012 Acquired absence of left breast and nipple: Secondary | ICD-10-CM | POA: Diagnosis not present

## 2014-11-14 DIAGNOSIS — Z90710 Acquired absence of both cervix and uterus: Secondary | ICD-10-CM | POA: Diagnosis not present

## 2014-11-14 DIAGNOSIS — C50412 Malignant neoplasm of upper-outer quadrant of left female breast: Secondary | ICD-10-CM

## 2014-11-14 DIAGNOSIS — Z510 Encounter for antineoplastic radiation therapy: Secondary | ICD-10-CM | POA: Diagnosis not present

## 2014-11-14 DIAGNOSIS — Z171 Estrogen receptor negative status [ER-]: Secondary | ICD-10-CM | POA: Diagnosis not present

## 2014-11-14 NOTE — Progress Notes (Signed)
  Radiation Oncology         [phone] ________________________________  Name: [name] MRN: [id number]  Date: 11/14/2014  DOB: 06/08/1951  Simulation Verification Note    ICD-9-CM ICD-10-CM   1. Breast cancer of upper-outer quadrant of left female breast 174.4 C50.412     Status: outpatient  NARRATIVE: The patient was brought to the treatment unit and placed in the planned treatment position. The clinical setup was verified. Then port films were obtained and uploaded to the radiation oncology medical record software.  The treatment beams were carefully compared against the planned radiation fields. The position location and shape of the radiation fields was reviewed. They targeted volume of tissue appears to be appropriately covered by the radiation beams. Organs at risk appear to be excluded as planned.  Based on my personal review, I approved the simulation verification. The patient's treatment will proceed as planned.  -----------------------------------  Blair Promise, PhD, MD

## 2014-11-15 ENCOUNTER — Ambulatory Visit
Admission: RE | Admit: 2014-11-15 | Discharge: 2014-11-15 | Disposition: A | Discharge status: Home / Self Care | Payer: Medicare Other | Source: Ambulatory Visit | Attending: Radiation Oncology | Admitting: Radiation Oncology

## 2014-11-15 ENCOUNTER — Ambulatory Visit: Payer: Medicare Other

## 2014-11-15 DIAGNOSIS — Z171 Estrogen receptor negative status [ER-]: Secondary | ICD-10-CM | POA: Diagnosis not present

## 2014-11-15 DIAGNOSIS — Z9012 Acquired absence of left breast and nipple: Secondary | ICD-10-CM | POA: Diagnosis not present

## 2014-11-15 DIAGNOSIS — Z510 Encounter for antineoplastic radiation therapy: Secondary | ICD-10-CM | POA: Diagnosis not present

## 2014-11-15 DIAGNOSIS — C50412 Malignant neoplasm of upper-outer quadrant of left female breast: Secondary | ICD-10-CM | POA: Diagnosis not present

## 2014-11-15 DIAGNOSIS — Z90710 Acquired absence of both cervix and uterus: Secondary | ICD-10-CM | POA: Diagnosis not present

## 2014-11-16 ENCOUNTER — Ambulatory Visit: Payer: Medicare Other

## 2014-11-16 ENCOUNTER — Ambulatory Visit
Admission: RE | Admit: 2014-11-16 | Discharge: 2014-11-16 | Disposition: A | Discharge status: Home / Self Care | Payer: Medicare Other | Source: Ambulatory Visit | Attending: Radiation Oncology | Admitting: Radiation Oncology

## 2014-11-16 DIAGNOSIS — Z171 Estrogen receptor negative status [ER-]: Secondary | ICD-10-CM | POA: Diagnosis not present

## 2014-11-16 DIAGNOSIS — Z90710 Acquired absence of both cervix and uterus: Secondary | ICD-10-CM | POA: Diagnosis not present

## 2014-11-16 DIAGNOSIS — Z9012 Acquired absence of left breast and nipple: Secondary | ICD-10-CM | POA: Diagnosis not present

## 2014-11-16 DIAGNOSIS — C50412 Malignant neoplasm of upper-outer quadrant of left female breast: Secondary | ICD-10-CM | POA: Diagnosis not present

## 2014-11-16 DIAGNOSIS — Z510 Encounter for antineoplastic radiation therapy: Secondary | ICD-10-CM | POA: Diagnosis not present

## 2014-11-17 ENCOUNTER — Ambulatory Visit: Payer: Medicare Other

## 2014-11-17 ENCOUNTER — Ambulatory Visit
Admission: RE | Admit: 2014-11-17 | Discharge: 2014-11-17 | Disposition: A | Discharge status: Home / Self Care | Payer: Medicare Other | Source: Ambulatory Visit | Attending: Radiation Oncology | Admitting: Radiation Oncology

## 2014-11-17 DIAGNOSIS — C50412 Malignant neoplasm of upper-outer quadrant of left female breast: Secondary | ICD-10-CM | POA: Diagnosis not present

## 2014-11-17 DIAGNOSIS — Z510 Encounter for antineoplastic radiation therapy: Secondary | ICD-10-CM | POA: Diagnosis not present

## 2014-11-17 DIAGNOSIS — Z90710 Acquired absence of both cervix and uterus: Secondary | ICD-10-CM | POA: Diagnosis not present

## 2014-11-17 DIAGNOSIS — Z171 Estrogen receptor negative status [ER-]: Secondary | ICD-10-CM | POA: Diagnosis not present

## 2014-11-17 DIAGNOSIS — Z9012 Acquired absence of left breast and nipple: Secondary | ICD-10-CM | POA: Diagnosis not present

## 2014-11-20 ENCOUNTER — Ambulatory Visit: Payer: Medicare Other

## 2014-11-20 ENCOUNTER — Ambulatory Visit: Admission: RE | Admit: 2014-11-20 | Payer: Medicare Other | Source: Ambulatory Visit

## 2014-11-20 ENCOUNTER — Ambulatory Visit
Admission: RE | Admit: 2014-11-20 | Discharge: 2014-11-20 | Disposition: A | Discharge status: Home / Self Care | Payer: Medicare Other | Source: Ambulatory Visit | Attending: Radiation Oncology | Admitting: Radiation Oncology

## 2014-11-21 ENCOUNTER — Ambulatory Visit
Admission: RE | Admit: 2014-11-21 | Discharge: 2014-11-21 | Disposition: A | Discharge status: Home / Self Care | Payer: Medicare Other | Source: Ambulatory Visit | Attending: Radiation Oncology | Admitting: Radiation Oncology

## 2014-11-21 ENCOUNTER — Ambulatory Visit: Payer: Medicare Other

## 2014-11-21 ENCOUNTER — Encounter: Payer: Self-pay | Admitting: Radiation Oncology

## 2014-11-21 VITALS — BP 122/93 | HR 95 | Temp 98.2°F | Resp 20 | Ht 61.0 in | Wt 231.2 lb

## 2014-11-21 DIAGNOSIS — C50412 Malignant neoplasm of upper-outer quadrant of left female breast: Secondary | ICD-10-CM | POA: Diagnosis not present

## 2014-11-21 DIAGNOSIS — Z510 Encounter for antineoplastic radiation therapy: Secondary | ICD-10-CM | POA: Diagnosis not present

## 2014-11-21 DIAGNOSIS — Z90710 Acquired absence of both cervix and uterus: Secondary | ICD-10-CM | POA: Diagnosis not present

## 2014-11-21 DIAGNOSIS — Z171 Estrogen receptor negative status [ER-]: Secondary | ICD-10-CM | POA: Diagnosis not present

## 2014-11-21 DIAGNOSIS — Z9012 Acquired absence of left breast and nipple: Secondary | ICD-10-CM | POA: Diagnosis not present

## 2014-11-21 MED ORDER — RADIAPLEXRX EX GEL
Freq: Once | CUTANEOUS | Status: AC
  Administered 2014-11-21: 15:00:00 via TOPICAL

## 2014-11-21 MED ORDER — ALRA NON-METALLIC DEODORANT (RAD-ONC)
1.0000 "application " | Freq: Once | TOPICAL | Status: AC
  Administered 2014-11-21: 1 via TOPICAL

## 2014-11-21 NOTE — Progress Notes (Signed)
Pt here for patient teaching.  Radiation and You booklet given with areas of pertinence flagged and highlighted.  Reviewed fatigue, skin changes, breast tenderness and breast swelling . Pt able to give teach back of to pat skin,apply Radiaplex bid and avoid applying anything to skin within 4 hours of treatment. Pt demonstrated understanding and verbalizes understanding of information given and will contact nursing with any questions or concerns.  Alra Deoderant also given.

## 2014-11-21 NOTE — Progress Notes (Signed)
  Radiation Oncology         [phone] ________________________________  Name: [name] MRN: [id number]  Date: 11/21/2014  DOB: April 07, 1951  Weekly Radiation Therapy Management  DIAGNOSIS: Clinical stage III, high-grade triple negative left breast cancer (ypT0, ypN0)  Current Dose: 7.2 Gy     Planned Dose:  61 Gy  Narrative . . . . . . . . The patient presents for routine under treatment assessment.                                   The patient is without complaint.                                 Set-up films were reviewed.                                 The chart was checked. Physical Findings. . .  height is 5\' 1"  (1.549 m) and weight is 231 lb 3.2 oz (104.872 kg). Her oral temperature is 98.2 F (36.8 C). Her blood pressure is 122/93 and her pulse is 95. Her respiration is 20. Marland Kitchen No significant skin reaction noted along the left breast area at this time. Impression . . . . . . . The patient is tolerating radiation. Plan . . . . . . . . . . . . Continue treatment as planned.  ________________________________   Blair Promise, PhD, MD

## 2014-11-21 NOTE — Progress Notes (Signed)
[name] has completed 4 fractions to her left breast.  She denies pain except for occasional sharp twinges that last for a few seconds in her left breast.  She denies fatigue.  The skin on her left breast is intact.  BP 122/93 mmHg  Pulse 95  Temp(Src) 98.2 F (36.8 C) (Oral)  Resp 20  Ht 5\' 1"  (1.549 m)  Wt 231 lb 3.2 oz (104.872 kg)  BMI 43.71 kg/m2

## 2014-11-22 ENCOUNTER — Ambulatory Visit: Payer: Medicare Other

## 2014-11-22 ENCOUNTER — Ambulatory Visit
Admission: RE | Admit: 2014-11-22 | Discharge: 2014-11-22 | Disposition: A | Discharge status: Home / Self Care | Payer: Medicare Other | Source: Ambulatory Visit | Attending: Radiation Oncology | Admitting: Radiation Oncology

## 2014-11-22 DIAGNOSIS — C50412 Malignant neoplasm of upper-outer quadrant of left female breast: Secondary | ICD-10-CM | POA: Diagnosis not present

## 2014-11-22 DIAGNOSIS — Z9012 Acquired absence of left breast and nipple: Secondary | ICD-10-CM | POA: Diagnosis not present

## 2014-11-22 DIAGNOSIS — Z90710 Acquired absence of both cervix and uterus: Secondary | ICD-10-CM | POA: Diagnosis not present

## 2014-11-22 DIAGNOSIS — Z510 Encounter for antineoplastic radiation therapy: Secondary | ICD-10-CM | POA: Diagnosis not present

## 2014-11-22 DIAGNOSIS — Z171 Estrogen receptor negative status [ER-]: Secondary | ICD-10-CM | POA: Diagnosis not present

## 2014-11-23 ENCOUNTER — Ambulatory Visit: Payer: Medicare Other

## 2014-11-23 ENCOUNTER — Ambulatory Visit
Admission: RE | Admit: 2014-11-23 | Discharge: 2014-11-23 | Disposition: A | Discharge status: Home / Self Care | Payer: Medicare Other | Source: Ambulatory Visit | Attending: Radiation Oncology | Admitting: Radiation Oncology

## 2014-11-23 DIAGNOSIS — Z90710 Acquired absence of both cervix and uterus: Secondary | ICD-10-CM | POA: Diagnosis not present

## 2014-11-23 DIAGNOSIS — Z510 Encounter for antineoplastic radiation therapy: Secondary | ICD-10-CM | POA: Diagnosis not present

## 2014-11-23 DIAGNOSIS — C50412 Malignant neoplasm of upper-outer quadrant of left female breast: Secondary | ICD-10-CM | POA: Diagnosis not present

## 2014-11-23 DIAGNOSIS — Z9012 Acquired absence of left breast and nipple: Secondary | ICD-10-CM | POA: Diagnosis not present

## 2014-11-23 DIAGNOSIS — Z171 Estrogen receptor negative status [ER-]: Secondary | ICD-10-CM | POA: Diagnosis not present

## 2014-11-24 ENCOUNTER — Ambulatory Visit: Payer: Medicare Other

## 2014-11-24 ENCOUNTER — Ambulatory Visit
Admission: RE | Admit: 2014-11-24 | Discharge: 2014-11-24 | Disposition: A | Discharge status: Home / Self Care | Payer: Medicare Other | Source: Ambulatory Visit | Attending: Radiation Oncology | Admitting: Radiation Oncology

## 2014-11-24 DIAGNOSIS — Z9012 Acquired absence of left breast and nipple: Secondary | ICD-10-CM | POA: Diagnosis not present

## 2014-11-24 DIAGNOSIS — Z90710 Acquired absence of both cervix and uterus: Secondary | ICD-10-CM | POA: Diagnosis not present

## 2014-11-24 DIAGNOSIS — Z510 Encounter for antineoplastic radiation therapy: Secondary | ICD-10-CM | POA: Diagnosis not present

## 2014-11-24 DIAGNOSIS — Z171 Estrogen receptor negative status [ER-]: Secondary | ICD-10-CM | POA: Diagnosis not present

## 2014-11-24 DIAGNOSIS — C50412 Malignant neoplasm of upper-outer quadrant of left female breast: Secondary | ICD-10-CM | POA: Diagnosis not present

## 2014-11-27 ENCOUNTER — Ambulatory Visit: Payer: Medicare Other

## 2014-11-27 ENCOUNTER — Ambulatory Visit
Admission: RE | Admit: 2014-11-27 | Discharge: 2014-11-27 | Disposition: A | Discharge status: Home / Self Care | Payer: Medicare Other | Source: Ambulatory Visit | Attending: Radiation Oncology | Admitting: Radiation Oncology

## 2014-11-27 DIAGNOSIS — Z171 Estrogen receptor negative status [ER-]: Secondary | ICD-10-CM | POA: Diagnosis not present

## 2014-11-27 DIAGNOSIS — Z510 Encounter for antineoplastic radiation therapy: Secondary | ICD-10-CM | POA: Diagnosis not present

## 2014-11-27 DIAGNOSIS — Z9012 Acquired absence of left breast and nipple: Secondary | ICD-10-CM | POA: Diagnosis not present

## 2014-11-27 DIAGNOSIS — C50412 Malignant neoplasm of upper-outer quadrant of left female breast: Secondary | ICD-10-CM | POA: Diagnosis not present

## 2014-11-27 DIAGNOSIS — Z90710 Acquired absence of both cervix and uterus: Secondary | ICD-10-CM | POA: Diagnosis not present

## 2014-11-28 ENCOUNTER — Ambulatory Visit: Payer: Medicare Other

## 2014-11-28 ENCOUNTER — Encounter: Payer: Self-pay | Admitting: Radiation Oncology

## 2014-11-28 ENCOUNTER — Ambulatory Visit
Admission: RE | Admit: 2014-11-28 | Discharge: 2014-11-28 | Disposition: A | Discharge status: Home / Self Care | Payer: Medicare Other | Source: Ambulatory Visit | Attending: Radiation Oncology | Admitting: Radiation Oncology

## 2014-11-28 VITALS — BP 112/81 | HR 84 | Temp 98.1°F | Resp 16 | Ht 61.0 in | Wt 230.8 lb

## 2014-11-28 DIAGNOSIS — Z510 Encounter for antineoplastic radiation therapy: Secondary | ICD-10-CM | POA: Diagnosis not present

## 2014-11-28 DIAGNOSIS — Z90710 Acquired absence of both cervix and uterus: Secondary | ICD-10-CM | POA: Diagnosis not present

## 2014-11-28 DIAGNOSIS — Z171 Estrogen receptor negative status [ER-]: Secondary | ICD-10-CM | POA: Diagnosis not present

## 2014-11-28 DIAGNOSIS — Z9012 Acquired absence of left breast and nipple: Secondary | ICD-10-CM | POA: Diagnosis not present

## 2014-11-28 DIAGNOSIS — C50412 Malignant neoplasm of upper-outer quadrant of left female breast: Secondary | ICD-10-CM

## 2014-11-28 NOTE — Progress Notes (Signed)
Weekly Management Note Current Dose: 16.2  Gy  Projected Dose: 61 Gy   Narrative:  The patient presents for routine under treatment assessment.  CBCT/MVCT images/Port film x-rays were reviewed.  The chart was checked. Doing well. No complaints.   Physical Findings: Weight: 230 lb 12.8 oz (104.69 kg). Slightly dark left breast.   Impression:  The patient is tolerating radiation.  Plan:  Continue treatment as planned. Continue radiaplex.

## 2014-11-28 NOTE — Progress Notes (Signed)
[name] has completed 9 fractions to her left breast.  She denies pain.  She reports occasional fatigue.  The skin on her left breast has slight hyperpigmentation.  She is using radiaplex gel twice a day.  BP 112/81 mmHg  Pulse 84  Temp(Src) 98.1 F (36.7 C) (Oral)  Resp 16  Ht 5\' 1"  (1.549 m)  Wt 230 lb 12.8 oz (104.69 kg)  BMI 43.63 kg/m2

## 2014-11-29 ENCOUNTER — Ambulatory Visit
Admission: RE | Admit: 2014-11-29 | Discharge: 2014-11-29 | Disposition: A | Discharge status: Home / Self Care | Payer: Medicare Other | Source: Ambulatory Visit | Attending: Radiation Oncology | Admitting: Radiation Oncology

## 2014-11-29 ENCOUNTER — Ambulatory Visit: Payer: Medicare Other

## 2014-11-29 DIAGNOSIS — Z9012 Acquired absence of left breast and nipple: Secondary | ICD-10-CM | POA: Diagnosis not present

## 2014-11-29 DIAGNOSIS — Z510 Encounter for antineoplastic radiation therapy: Secondary | ICD-10-CM | POA: Diagnosis not present

## 2014-11-29 DIAGNOSIS — Z90710 Acquired absence of both cervix and uterus: Secondary | ICD-10-CM | POA: Diagnosis not present

## 2014-11-29 DIAGNOSIS — Z171 Estrogen receptor negative status [ER-]: Secondary | ICD-10-CM | POA: Diagnosis not present

## 2014-11-29 DIAGNOSIS — C50412 Malignant neoplasm of upper-outer quadrant of left female breast: Secondary | ICD-10-CM | POA: Diagnosis not present

## 2014-11-30 ENCOUNTER — Ambulatory Visit: Payer: Medicare Other

## 2014-11-30 ENCOUNTER — Ambulatory Visit
Admission: RE | Admit: 2014-11-30 | Discharge: 2014-11-30 | Disposition: A | Discharge status: Home / Self Care | Payer: Medicare Other | Source: Ambulatory Visit | Attending: Radiation Oncology | Admitting: Radiation Oncology

## 2014-11-30 DIAGNOSIS — C50412 Malignant neoplasm of upper-outer quadrant of left female breast: Secondary | ICD-10-CM | POA: Diagnosis not present

## 2014-11-30 DIAGNOSIS — Z90710 Acquired absence of both cervix and uterus: Secondary | ICD-10-CM | POA: Diagnosis not present

## 2014-11-30 DIAGNOSIS — Z9012 Acquired absence of left breast and nipple: Secondary | ICD-10-CM | POA: Diagnosis not present

## 2014-11-30 DIAGNOSIS — Z510 Encounter for antineoplastic radiation therapy: Secondary | ICD-10-CM | POA: Diagnosis not present

## 2014-11-30 DIAGNOSIS — Z171 Estrogen receptor negative status [ER-]: Secondary | ICD-10-CM | POA: Diagnosis not present

## 2014-12-01 ENCOUNTER — Ambulatory Visit
Admission: RE | Admit: 2014-12-01 | Discharge: 2014-12-01 | Disposition: A | Discharge status: Home / Self Care | Payer: Medicare Other | Source: Ambulatory Visit | Attending: Radiation Oncology | Admitting: Radiation Oncology

## 2014-12-01 ENCOUNTER — Ambulatory Visit: Payer: Medicare Other

## 2014-12-01 ENCOUNTER — Ambulatory Visit (HOSPITAL_BASED_OUTPATIENT_CLINIC_OR_DEPARTMENT_OTHER): Payer: Medicare Other

## 2014-12-01 ENCOUNTER — Other Ambulatory Visit: Payer: Self-pay | Admitting: *Deleted

## 2014-12-01 VITALS — BP 114/54 | HR 89 | Temp 98.3°F

## 2014-12-01 DIAGNOSIS — Z510 Encounter for antineoplastic radiation therapy: Secondary | ICD-10-CM | POA: Diagnosis not present

## 2014-12-01 DIAGNOSIS — Z95828 Presence of other vascular implants and grafts: Secondary | ICD-10-CM

## 2014-12-01 DIAGNOSIS — Z90710 Acquired absence of both cervix and uterus: Secondary | ICD-10-CM | POA: Diagnosis not present

## 2014-12-01 DIAGNOSIS — Z452 Encounter for adjustment and management of vascular access device: Secondary | ICD-10-CM

## 2014-12-01 DIAGNOSIS — C50412 Malignant neoplasm of upper-outer quadrant of left female breast: Secondary | ICD-10-CM | POA: Diagnosis not present

## 2014-12-01 DIAGNOSIS — C50812 Malignant neoplasm of overlapping sites of left female breast: Secondary | ICD-10-CM

## 2014-12-01 DIAGNOSIS — Z171 Estrogen receptor negative status [ER-]: Secondary | ICD-10-CM | POA: Diagnosis not present

## 2014-12-01 DIAGNOSIS — Z9012 Acquired absence of left breast and nipple: Secondary | ICD-10-CM | POA: Diagnosis not present

## 2014-12-01 LAB — COMPREHENSIVE METABOLIC PANEL (CC13)
ALBUMIN: 3.3 g/dL — AB (ref 3.5–5.0)
ALT: 14 U/L (ref 0–55)
AST: 15 U/L (ref 5–34)
Alkaline Phosphatase: 123 U/L (ref 40–150)
Anion Gap: 8 mEq/L (ref 3–11)
BUN: 10.1 mg/dL (ref 7.0–26.0)
CALCIUM: 9.1 mg/dL (ref 8.4–10.4)
CHLORIDE: 109 meq/L (ref 98–109)
CO2: 23 mEq/L (ref 22–29)
Creatinine: 0.8 mg/dL (ref 0.6–1.1)
EGFR: >90 mL/min/{1.73_m2} (ref >90)
GLUCOSE: 100 mg/dL (ref 70–140)
POTASSIUM: 3.7 meq/L (ref 3.5–5.1)
Sodium: 141 mEq/L (ref 136–145)
Total Bilirubin: 0.75 mg/dL (ref 0.20–1.20)
Total Protein: 6.6 g/dL (ref 6.4–8.3)

## 2014-12-01 LAB — CBC WITH DIFFERENTIAL/PLATELET
BASO%: 0.7 % (ref 0.0–2.0)
Basophils Absolute: 0 10*3/uL (ref 0.0–0.1)
EOS ABS: 0.1 10*3/uL (ref 0.0–0.5)
EOS%: 2.6 % (ref 0.0–7.0)
HCT: 34.8 % (ref 34.8–46.6)
HGB: 11.3 g/dL — ABNORMAL LOW (ref 11.6–15.9)
LYMPH#: 0.8 10*3/uL — AB (ref 0.9–3.3)
LYMPH%: 20.6 % (ref 14.0–49.7)
MCH: 28.5 pg (ref 25.1–34.0)
MCHC: 32.4 g/dL (ref 31.5–36.0)
MCV: 87.9 fL (ref 79.5–101.0)
MONO#: 0.4 10*3/uL (ref 0.1–0.9)
MONO%: 10.5 % (ref 0.0–14.0)
NEUT#: 2.4 10*3/uL (ref 1.5–6.5)
NEUT%: 65.6 % (ref 38.4–76.8)
Platelets: 230 10*3/uL (ref 145–400)
RBC: 3.96 10*6/uL (ref 3.70–5.45)
RDW: 13.6 % (ref 11.2–14.5)
WBC: 3.7 10*3/uL — AB (ref 3.9–10.3)

## 2014-12-01 MED ORDER — SODIUM CHLORIDE 0.9 % IJ SOLN
10.0000 mL | INTRAMUSCULAR | Status: DC | PRN
  Administered 2014-12-01: 10 mL via INTRAVENOUS
  Filled 2014-12-01: qty 10

## 2014-12-01 MED ORDER — HEPARIN SOD (PORK) LOCK FLUSH 100 UNIT/ML IV SOLN
500.0000 [IU] | Freq: Once | INTRAVENOUS | Status: AC
  Administered 2014-12-01: 500 [IU] via INTRAVENOUS
  Filled 2014-12-01: qty 5

## 2014-12-01 NOTE — Patient Instructions (Signed)

## 2014-12-04 ENCOUNTER — Ambulatory Visit
Admission: RE | Admit: 2014-12-04 | Discharge: 2014-12-04 | Disposition: A | Discharge status: Home / Self Care | Payer: Medicare Other | Source: Ambulatory Visit | Attending: Radiation Oncology | Admitting: Radiation Oncology

## 2014-12-04 ENCOUNTER — Ambulatory Visit: Payer: Medicare Other

## 2014-12-04 DIAGNOSIS — Z90710 Acquired absence of both cervix and uterus: Secondary | ICD-10-CM | POA: Diagnosis not present

## 2014-12-04 DIAGNOSIS — Z171 Estrogen receptor negative status [ER-]: Secondary | ICD-10-CM | POA: Diagnosis not present

## 2014-12-04 DIAGNOSIS — C50412 Malignant neoplasm of upper-outer quadrant of left female breast: Secondary | ICD-10-CM | POA: Diagnosis not present

## 2014-12-04 DIAGNOSIS — Z9012 Acquired absence of left breast and nipple: Secondary | ICD-10-CM | POA: Diagnosis not present

## 2014-12-04 DIAGNOSIS — Z510 Encounter for antineoplastic radiation therapy: Secondary | ICD-10-CM | POA: Diagnosis not present

## 2014-12-05 ENCOUNTER — Ambulatory Visit
Admission: RE | Admit: 2014-12-05 | Discharge: 2014-12-05 | Disposition: A | Discharge status: Home / Self Care | Payer: Medicare Other | Source: Ambulatory Visit | Attending: Radiation Oncology | Admitting: Radiation Oncology

## 2014-12-05 ENCOUNTER — Ambulatory Visit: Payer: Medicare Other

## 2014-12-05 ENCOUNTER — Encounter: Payer: Self-pay | Admitting: Radiation Oncology

## 2014-12-05 VITALS — BP 130/94 | HR 94 | Temp 97.7°F | Resp 16 | Ht 61.0 in | Wt 231.2 lb

## 2014-12-05 DIAGNOSIS — C50412 Malignant neoplasm of upper-outer quadrant of left female breast: Secondary | ICD-10-CM

## 2014-12-05 DIAGNOSIS — Z171 Estrogen receptor negative status [ER-]: Secondary | ICD-10-CM | POA: Diagnosis not present

## 2014-12-05 DIAGNOSIS — Z510 Encounter for antineoplastic radiation therapy: Secondary | ICD-10-CM | POA: Diagnosis not present

## 2014-12-05 DIAGNOSIS — Z9012 Acquired absence of left breast and nipple: Secondary | ICD-10-CM | POA: Diagnosis not present

## 2014-12-05 DIAGNOSIS — Z90710 Acquired absence of both cervix and uterus: Secondary | ICD-10-CM | POA: Diagnosis not present

## 2014-12-05 NOTE — Progress Notes (Signed)
[name] has completed 14 fractions to her left breast.  She denies pain.  She reports her energy level is improving.  The skin on her left breast has hyperpigmentation.  She is using radiaplex gel.  BP 130/94 mmHg  Pulse 94  Temp(Src) 97.7 F (36.5 C) (Oral)  Resp 16  Ht 5\' 1"  (1.549 m)  Wt 231 lb 3.2 oz (104.872 kg)  BMI 43.71 kg/m2

## 2014-12-05 NOTE — Progress Notes (Signed)
  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 . . . . . . . The patient presents for routine under treatment assessment.                                   The patient is without complaint. She feels her energy level is actually improved over the past couple of weeks. The patient denies any itching or discomfort within the breast area.                                 Set-up films were reviewed.                                 The chart was checked. Physical Findings. . .  height is 5\' 1"  (1.549 m) and weight is 231 lb 3.2 oz (104.872 kg). Her oral temperature is 97.7 F (36.5 C). Her blood pressure is 130/94 and her pulse is 94. Her respiration is 16. . Weight essentially stable. The left breast area shows hyperpigmentation changes without any skin breakdown Impression . . . . . . . The patient is tolerating radiation. Plan . . . . . . . . . . . . Continue treatment as planned.  ________________________________   Blair Promise, PhD, MD

## 2014-12-06 ENCOUNTER — Ambulatory Visit: Payer: Medicare Other

## 2014-12-06 ENCOUNTER — Ambulatory Visit
Admission: RE | Admit: 2014-12-06 | Discharge: 2014-12-06 | Disposition: A | Discharge status: Home / Self Care | Payer: Medicare Other | Source: Ambulatory Visit | Attending: Radiation Oncology | Admitting: Radiation Oncology

## 2014-12-06 DIAGNOSIS — Z90710 Acquired absence of both cervix and uterus: Secondary | ICD-10-CM | POA: Diagnosis not present

## 2014-12-06 DIAGNOSIS — Z9012 Acquired absence of left breast and nipple: Secondary | ICD-10-CM | POA: Diagnosis not present

## 2014-12-06 DIAGNOSIS — Z171 Estrogen receptor negative status [ER-]: Secondary | ICD-10-CM | POA: Diagnosis not present

## 2014-12-06 DIAGNOSIS — C50412 Malignant neoplasm of upper-outer quadrant of left female breast: Secondary | ICD-10-CM | POA: Diagnosis not present

## 2014-12-06 DIAGNOSIS — Z510 Encounter for antineoplastic radiation therapy: Secondary | ICD-10-CM | POA: Diagnosis not present

## 2014-12-07 ENCOUNTER — Ambulatory Visit
Admission: RE | Admit: 2014-12-07 | Discharge: 2014-12-07 | Disposition: A | Discharge status: Home / Self Care | Payer: Medicare Other | Source: Ambulatory Visit | Attending: Radiation Oncology | Admitting: Radiation Oncology

## 2014-12-07 ENCOUNTER — Ambulatory Visit: Payer: Medicare Other

## 2014-12-07 DIAGNOSIS — C50412 Malignant neoplasm of upper-outer quadrant of left female breast: Secondary | ICD-10-CM | POA: Diagnosis not present

## 2014-12-07 DIAGNOSIS — Z510 Encounter for antineoplastic radiation therapy: Secondary | ICD-10-CM | POA: Diagnosis not present

## 2014-12-07 DIAGNOSIS — Z171 Estrogen receptor negative status [ER-]: Secondary | ICD-10-CM | POA: Diagnosis not present

## 2014-12-07 DIAGNOSIS — Z9012 Acquired absence of left breast and nipple: Secondary | ICD-10-CM | POA: Diagnosis not present

## 2014-12-07 DIAGNOSIS — Z90710 Acquired absence of both cervix and uterus: Secondary | ICD-10-CM | POA: Diagnosis not present

## 2014-12-08 ENCOUNTER — Ambulatory Visit: Payer: Medicare Other

## 2014-12-08 ENCOUNTER — Ambulatory Visit
Admission: RE | Admit: 2014-12-08 | Discharge: 2014-12-08 | Disposition: A | Discharge status: Home / Self Care | Payer: Medicare Other | Source: Ambulatory Visit | Attending: Radiation Oncology | Admitting: Radiation Oncology

## 2014-12-08 DIAGNOSIS — Z510 Encounter for antineoplastic radiation therapy: Secondary | ICD-10-CM | POA: Diagnosis not present

## 2014-12-08 DIAGNOSIS — Z90710 Acquired absence of both cervix and uterus: Secondary | ICD-10-CM | POA: Diagnosis not present

## 2014-12-08 DIAGNOSIS — C50412 Malignant neoplasm of upper-outer quadrant of left female breast: Secondary | ICD-10-CM | POA: Diagnosis not present

## 2014-12-08 DIAGNOSIS — Z171 Estrogen receptor negative status [ER-]: Secondary | ICD-10-CM | POA: Diagnosis not present

## 2014-12-08 DIAGNOSIS — Z9012 Acquired absence of left breast and nipple: Secondary | ICD-10-CM | POA: Diagnosis not present

## 2014-12-11 ENCOUNTER — Ambulatory Visit: Payer: Medicare Other

## 2014-12-11 ENCOUNTER — Ambulatory Visit
Admission: RE | Admit: 2014-12-11 | Discharge: 2014-12-11 | Disposition: A | Discharge status: Home / Self Care | Payer: Medicare Other | Source: Ambulatory Visit | Attending: Radiation Oncology | Admitting: Radiation Oncology

## 2014-12-11 DIAGNOSIS — Z510 Encounter for antineoplastic radiation therapy: Secondary | ICD-10-CM | POA: Diagnosis not present

## 2014-12-11 DIAGNOSIS — Z90710 Acquired absence of both cervix and uterus: Secondary | ICD-10-CM | POA: Diagnosis not present

## 2014-12-11 DIAGNOSIS — Z171 Estrogen receptor negative status [ER-]: Secondary | ICD-10-CM | POA: Diagnosis not present

## 2014-12-11 DIAGNOSIS — Z9012 Acquired absence of left breast and nipple: Secondary | ICD-10-CM | POA: Diagnosis not present

## 2014-12-11 DIAGNOSIS — C50412 Malignant neoplasm of upper-outer quadrant of left female breast: Secondary | ICD-10-CM | POA: Diagnosis not present

## 2014-12-12 ENCOUNTER — Ambulatory Visit: Payer: Medicare Other

## 2014-12-12 ENCOUNTER — Ambulatory Visit
Admission: RE | Admit: 2014-12-12 | Discharge: 2014-12-12 | Disposition: A | Discharge status: Home / Self Care | Payer: Medicare Other | Source: Ambulatory Visit | Attending: Radiation Oncology | Admitting: Radiation Oncology

## 2014-12-12 ENCOUNTER — Encounter: Payer: Self-pay | Admitting: Radiation Oncology

## 2014-12-12 VITALS — BP 130/89 | HR 88 | Temp 98.0°F | Resp 16 | Ht 61.0 in | Wt 230.7 lb

## 2014-12-12 DIAGNOSIS — C189 Malignant neoplasm of colon, unspecified: Secondary | ICD-10-CM

## 2014-12-12 DIAGNOSIS — Z171 Estrogen receptor negative status [ER-]: Secondary | ICD-10-CM | POA: Diagnosis not present

## 2014-12-12 DIAGNOSIS — C50412 Malignant neoplasm of upper-outer quadrant of left female breast: Secondary | ICD-10-CM

## 2014-12-12 DIAGNOSIS — Z90710 Acquired absence of both cervix and uterus: Secondary | ICD-10-CM | POA: Diagnosis not present

## 2014-12-12 DIAGNOSIS — Z9012 Acquired absence of left breast and nipple: Secondary | ICD-10-CM | POA: Diagnosis not present

## 2014-12-12 DIAGNOSIS — Z510 Encounter for antineoplastic radiation therapy: Secondary | ICD-10-CM | POA: Diagnosis not present

## 2014-12-12 NOTE — Progress Notes (Signed)
  Radiation Oncology         [phone] ________________________________  Name: [name] MRN: [id number]  Date: 12/12/2014  DOB: 1951/08/24  Weekly Radiation Therapy Management  DIAGNOSIS: Clinical stage III, high-grade triple negative left breast cancer (ypT0, ypN0)  Current Dose: 34.2 Gy     Planned Dose:  61 Gy  Narrative . . . . . . . . The patient presents for routine under treatment assessment.                                   The patient is having some itching and discomfort along the treatment area                                 Set-up films were reviewed.                                 The chart was checked. Physical Findings. . .  height is 5\' 1"  (1.549 m) and weight is 230 lb 11.2 oz (104.645 kg). Her oral temperature is 98 F (36.7 C). Her blood pressure is 130/89 and her pulse is 88. Her respiration is 16. . The left breast and axillary area shows hyperpigmentation changes without any skin breakdown.  Impression . . . . . . . The patient is tolerating radiation. Plan . . . . . . . . . . . . Continue treatment as planned.  ________________________________   Blair Promise, PhD, MD

## 2014-12-12 NOTE — Progress Notes (Signed)
[name] has completed 19 fractions to her left breast.  She denies pain.  She reports fatigue.  The skin on her left breast and under her left arm has hyperpigmentation.  She reports her skin occasionally itches and burns.  Recommenced using hydrocortisone cream.  She is using radiaplex gel.  BP 130/89 mmHg  Pulse 88  Temp(Src) 98 F (36.7 C) (Oral)  Resp 16  Ht 5\' 1"  (1.549 m)  Wt 230 lb 11.2 oz (104.645 kg)  BMI 43.61 kg/m2

## 2014-12-12 NOTE — Progress Notes (Signed)
  Radiation Oncology         [phone] ________________________________  Name: [name] MRN: [id number]  Date: 12/12/2014  DOB: [birth year]  Simulation Verification Note    ICD-9-CM ICD-10-CM   1. Malignant neoplasm of colon 153.9 C18.9   2. Breast cancer of upper-outer quadrant of left female breast 174.4 C50.412     Status: outpatient  NARRATIVE: The patient was brought to the treatment unit and placed in the planned treatment position. The clinical setup was verified. Then port films were obtained and uploaded to the radiation oncology medical record software.  The treatment beams were carefully compared against the planned radiation fields. The position location and shape of the radiation fields (electron) was reviewed. They targeted volume of tissue appears to be appropriately covered by the radiation beams. Organs at risk appear to be excluded as planned.  Based on my personal review, I approved the simulation verification. The patient's treatment will proceed as planned.  -----------------------------------  Blair Promise, PhD, MD

## 2014-12-13 ENCOUNTER — Ambulatory Visit: Payer: Medicare Other

## 2014-12-13 ENCOUNTER — Ambulatory Visit
Admission: RE | Admit: 2014-12-13 | Discharge: 2014-12-13 | Disposition: A | Discharge status: Home / Self Care | Payer: Medicare Other | Source: Ambulatory Visit | Attending: Radiation Oncology | Admitting: Radiation Oncology

## 2014-12-13 DIAGNOSIS — Z510 Encounter for antineoplastic radiation therapy: Secondary | ICD-10-CM | POA: Diagnosis not present

## 2014-12-13 DIAGNOSIS — Z9012 Acquired absence of left breast and nipple: Secondary | ICD-10-CM | POA: Diagnosis not present

## 2014-12-13 DIAGNOSIS — Z90710 Acquired absence of both cervix and uterus: Secondary | ICD-10-CM | POA: Diagnosis not present

## 2014-12-13 DIAGNOSIS — C50412 Malignant neoplasm of upper-outer quadrant of left female breast: Secondary | ICD-10-CM | POA: Diagnosis not present

## 2014-12-13 DIAGNOSIS — Z171 Estrogen receptor negative status [ER-]: Secondary | ICD-10-CM | POA: Diagnosis not present

## 2014-12-14 ENCOUNTER — Ambulatory Visit: Payer: Medicare Other

## 2014-12-14 ENCOUNTER — Ambulatory Visit
Admission: RE | Admit: 2014-12-14 | Discharge: 2014-12-14 | Disposition: A | Discharge status: Home / Self Care | Payer: Medicare Other | Source: Ambulatory Visit | Attending: Radiation Oncology | Admitting: Radiation Oncology

## 2014-12-14 DIAGNOSIS — Z90710 Acquired absence of both cervix and uterus: Secondary | ICD-10-CM | POA: Diagnosis not present

## 2014-12-14 DIAGNOSIS — Z9012 Acquired absence of left breast and nipple: Secondary | ICD-10-CM | POA: Diagnosis not present

## 2014-12-14 DIAGNOSIS — C50412 Malignant neoplasm of upper-outer quadrant of left female breast: Secondary | ICD-10-CM | POA: Diagnosis not present

## 2014-12-14 DIAGNOSIS — Z510 Encounter for antineoplastic radiation therapy: Secondary | ICD-10-CM | POA: Diagnosis not present

## 2014-12-14 DIAGNOSIS — Z171 Estrogen receptor negative status [ER-]: Secondary | ICD-10-CM | POA: Diagnosis not present

## 2014-12-15 ENCOUNTER — Ambulatory Visit
Admission: RE | Admit: 2014-12-15 | Discharge: 2014-12-15 | Disposition: A | Discharge status: Home / Self Care | Payer: Medicare Other | Source: Ambulatory Visit | Attending: Radiation Oncology | Admitting: Radiation Oncology

## 2014-12-15 ENCOUNTER — Ambulatory Visit: Payer: Medicare Other

## 2014-12-15 DIAGNOSIS — Z171 Estrogen receptor negative status [ER-]: Secondary | ICD-10-CM | POA: Diagnosis not present

## 2014-12-15 DIAGNOSIS — Z90710 Acquired absence of both cervix and uterus: Secondary | ICD-10-CM | POA: Diagnosis not present

## 2014-12-15 DIAGNOSIS — Z510 Encounter for antineoplastic radiation therapy: Secondary | ICD-10-CM | POA: Diagnosis not present

## 2014-12-15 DIAGNOSIS — C50412 Malignant neoplasm of upper-outer quadrant of left female breast: Secondary | ICD-10-CM | POA: Diagnosis not present

## 2014-12-15 DIAGNOSIS — Z9012 Acquired absence of left breast and nipple: Secondary | ICD-10-CM | POA: Diagnosis not present

## 2014-12-18 ENCOUNTER — Ambulatory Visit: Payer: Medicare Other

## 2014-12-18 ENCOUNTER — Ambulatory Visit
Admission: RE | Admit: 2014-12-18 | Discharge: 2014-12-18 | Disposition: A | Discharge status: Home / Self Care | Payer: Medicare Other | Source: Ambulatory Visit | Attending: Radiation Oncology | Admitting: Radiation Oncology

## 2014-12-18 DIAGNOSIS — C50412 Malignant neoplasm of upper-outer quadrant of left female breast: Secondary | ICD-10-CM | POA: Diagnosis not present

## 2014-12-18 DIAGNOSIS — Z510 Encounter for antineoplastic radiation therapy: Secondary | ICD-10-CM | POA: Diagnosis not present

## 2014-12-18 DIAGNOSIS — Z9012 Acquired absence of left breast and nipple: Secondary | ICD-10-CM | POA: Diagnosis not present

## 2014-12-18 DIAGNOSIS — Z171 Estrogen receptor negative status [ER-]: Secondary | ICD-10-CM | POA: Diagnosis not present

## 2014-12-18 DIAGNOSIS — Z90710 Acquired absence of both cervix and uterus: Secondary | ICD-10-CM | POA: Diagnosis not present

## 2014-12-19 ENCOUNTER — Ambulatory Visit: Payer: Medicare Other

## 2014-12-19 ENCOUNTER — Ambulatory Visit
Admission: RE | Admit: 2014-12-19 | Discharge: 2014-12-19 | Disposition: A | Discharge status: Home / Self Care | Payer: Medicare Other | Source: Ambulatory Visit | Attending: Radiation Oncology | Admitting: Radiation Oncology

## 2014-12-19 ENCOUNTER — Encounter: Payer: Self-pay | Admitting: Radiation Oncology

## 2014-12-19 VITALS — BP 138/92 | HR 79 | Temp 98.0°F | Resp 20 | Wt 232.0 lb

## 2014-12-19 DIAGNOSIS — Z90710 Acquired absence of both cervix and uterus: Secondary | ICD-10-CM | POA: Diagnosis not present

## 2014-12-19 DIAGNOSIS — Z510 Encounter for antineoplastic radiation therapy: Secondary | ICD-10-CM | POA: Diagnosis not present

## 2014-12-19 DIAGNOSIS — Z171 Estrogen receptor negative status [ER-]: Secondary | ICD-10-CM | POA: Diagnosis not present

## 2014-12-19 DIAGNOSIS — C50412 Malignant neoplasm of upper-outer quadrant of left female breast: Secondary | ICD-10-CM | POA: Diagnosis not present

## 2014-12-19 DIAGNOSIS — Z9012 Acquired absence of left breast and nipple: Secondary | ICD-10-CM | POA: Diagnosis not present

## 2014-12-19 NOTE — Progress Notes (Addendum)
Weekly rad txs 24/33  Left breast, hyperpigmentation  On breast and axilla, skin intact except under inframmary fold  A few scant skin abrasions, appetite good, nipple area tender and using radiaplex gel bid , energy level pretty good,  1:09 PM

## 2014-12-19 NOTE — Progress Notes (Signed)
This encounter was created in error - please disregard.

## 2014-12-19 NOTE — Progress Notes (Signed)
  Radiation Oncology         [phone] ________________________________  Name: [name] MRN: [id number]  Date: 12/19/2014  DOB: July 10, 1951  Weekly Radiation Therapy Management  DIAGNOSIS: Clinical stage III, high-grade triple negative left breast cancer (ypT0, ypN0)  Current Dose: 43.2 Gy     Planned Dose:  61 Gy  Narrative . . . . . . . . The patient presents for routine under treatment assessment.                                   The patient is without complaint except for some soreness and discomfort in the treatment area. Her appetite is good. She denies any chills or fever.                                 Set-up films were reviewed.                                 The chart was checked. Physical Findings. . .  weight is 232 lb (105.235 kg). Her oral temperature is 98 F (36.7 C). Her blood pressure is 138/92 and her pulse is 79. Her respiration is 20. . The lungs are clear. The heart has a regular rhythm and rate. The left breast area shows hyperpigmentation changes and dry desquamation but no moist desquamation. Impression . . . . . . . The patient is tolerating radiation. Plan . . . . . . . . . . . . Continue treatment as planned.  ________________________________   Blair Promise, PhD, MD

## 2014-12-20 ENCOUNTER — Ambulatory Visit: Payer: Medicare Other

## 2014-12-20 ENCOUNTER — Ambulatory Visit
Admission: RE | Admit: 2014-12-20 | Discharge: 2014-12-20 | Disposition: A | Discharge status: Home / Self Care | Payer: Medicare Other | Source: Ambulatory Visit | Attending: Radiation Oncology | Admitting: Radiation Oncology

## 2014-12-20 DIAGNOSIS — C50412 Malignant neoplasm of upper-outer quadrant of left female breast: Secondary | ICD-10-CM | POA: Diagnosis not present

## 2014-12-20 DIAGNOSIS — Z9012 Acquired absence of left breast and nipple: Secondary | ICD-10-CM | POA: Diagnosis not present

## 2014-12-20 DIAGNOSIS — Z90710 Acquired absence of both cervix and uterus: Secondary | ICD-10-CM | POA: Diagnosis not present

## 2014-12-20 DIAGNOSIS — Z510 Encounter for antineoplastic radiation therapy: Secondary | ICD-10-CM | POA: Diagnosis not present

## 2014-12-20 DIAGNOSIS — Z171 Estrogen receptor negative status [ER-]: Secondary | ICD-10-CM | POA: Diagnosis not present

## 2014-12-21 ENCOUNTER — Ambulatory Visit: Payer: Medicare Other

## 2014-12-21 ENCOUNTER — Ambulatory Visit
Admission: RE | Admit: 2014-12-21 | Discharge: 2014-12-21 | Disposition: A | Discharge status: Home / Self Care | Payer: Medicare Other | Source: Ambulatory Visit | Attending: Radiation Oncology | Admitting: Radiation Oncology

## 2014-12-21 DIAGNOSIS — Z9012 Acquired absence of left breast and nipple: Secondary | ICD-10-CM | POA: Diagnosis not present

## 2014-12-21 DIAGNOSIS — Z90710 Acquired absence of both cervix and uterus: Secondary | ICD-10-CM | POA: Diagnosis not present

## 2014-12-21 DIAGNOSIS — Z510 Encounter for antineoplastic radiation therapy: Secondary | ICD-10-CM | POA: Diagnosis not present

## 2014-12-21 DIAGNOSIS — C50412 Malignant neoplasm of upper-outer quadrant of left female breast: Secondary | ICD-10-CM | POA: Diagnosis not present

## 2014-12-21 DIAGNOSIS — Z171 Estrogen receptor negative status [ER-]: Secondary | ICD-10-CM | POA: Diagnosis not present

## 2014-12-22 ENCOUNTER — Ambulatory Visit: Payer: Medicare Other

## 2014-12-22 ENCOUNTER — Ambulatory Visit
Admission: RE | Admit: 2014-12-22 | Discharge: 2014-12-22 | Disposition: A | Discharge status: Home / Self Care | Payer: Medicare Other | Source: Ambulatory Visit | Attending: Radiation Oncology | Admitting: Radiation Oncology

## 2014-12-22 DIAGNOSIS — Z9012 Acquired absence of left breast and nipple: Secondary | ICD-10-CM | POA: Diagnosis not present

## 2014-12-22 DIAGNOSIS — C50412 Malignant neoplasm of upper-outer quadrant of left female breast: Secondary | ICD-10-CM | POA: Diagnosis not present

## 2014-12-22 DIAGNOSIS — Z90710 Acquired absence of both cervix and uterus: Secondary | ICD-10-CM | POA: Diagnosis not present

## 2014-12-22 DIAGNOSIS — Z510 Encounter for antineoplastic radiation therapy: Secondary | ICD-10-CM | POA: Diagnosis not present

## 2014-12-22 DIAGNOSIS — Z171 Estrogen receptor negative status [ER-]: Secondary | ICD-10-CM | POA: Diagnosis not present

## 2014-12-25 ENCOUNTER — Ambulatory Visit: Payer: Medicare Other

## 2014-12-25 ENCOUNTER — Ambulatory Visit
Admission: RE | Admit: 2014-12-25 | Discharge: 2014-12-25 | Disposition: A | Discharge status: Home / Self Care | Payer: Medicare Other | Source: Ambulatory Visit | Attending: Radiation Oncology | Admitting: Radiation Oncology

## 2014-12-25 DIAGNOSIS — Z171 Estrogen receptor negative status [ER-]: Secondary | ICD-10-CM | POA: Diagnosis not present

## 2014-12-25 DIAGNOSIS — Z90710 Acquired absence of both cervix and uterus: Secondary | ICD-10-CM | POA: Diagnosis not present

## 2014-12-25 DIAGNOSIS — Z9012 Acquired absence of left breast and nipple: Secondary | ICD-10-CM | POA: Diagnosis not present

## 2014-12-25 DIAGNOSIS — C50412 Malignant neoplasm of upper-outer quadrant of left female breast: Secondary | ICD-10-CM | POA: Diagnosis not present

## 2014-12-25 DIAGNOSIS — Z510 Encounter for antineoplastic radiation therapy: Secondary | ICD-10-CM | POA: Diagnosis not present

## 2014-12-26 ENCOUNTER — Ambulatory Visit: Payer: Medicare Other

## 2014-12-26 ENCOUNTER — Ambulatory Visit: Payer: Medicare Other | Admitting: Radiation Oncology

## 2014-12-27 ENCOUNTER — Ambulatory Visit: Payer: Medicare Other

## 2014-12-27 ENCOUNTER — Encounter: Payer: Self-pay | Admitting: Radiation Oncology

## 2014-12-27 ENCOUNTER — Ambulatory Visit
Admission: RE | Admit: 2014-12-27 | Discharge: 2014-12-27 | Disposition: A | Discharge status: Home / Self Care | Payer: Medicare Other | Source: Ambulatory Visit | Attending: Radiation Oncology | Admitting: Radiation Oncology

## 2014-12-27 VITALS — BP 122/79 | HR 95 | Temp 98.1°F | Resp 16 | Ht 61.0 in | Wt 231.4 lb

## 2014-12-27 DIAGNOSIS — Z510 Encounter for antineoplastic radiation therapy: Secondary | ICD-10-CM | POA: Diagnosis not present

## 2014-12-27 DIAGNOSIS — C50412 Malignant neoplasm of upper-outer quadrant of left female breast: Secondary | ICD-10-CM

## 2014-12-27 DIAGNOSIS — Z171 Estrogen receptor negative status [ER-]: Secondary | ICD-10-CM | POA: Diagnosis not present

## 2014-12-27 DIAGNOSIS — Z9012 Acquired absence of left breast and nipple: Secondary | ICD-10-CM | POA: Diagnosis not present

## 2014-12-27 DIAGNOSIS — Z90710 Acquired absence of both cervix and uterus: Secondary | ICD-10-CM | POA: Diagnosis not present

## 2014-12-27 MED ORDER — RADIAPLEXRX EX GEL
Freq: Once | CUTANEOUS | Status: AC
  Administered 2014-12-27: 18:00:00 via TOPICAL

## 2014-12-27 NOTE — Progress Notes (Signed)
[name] has completed 29 fractions to her left breast.  She denies pain but states that her left nipple area is very sore.  She reports occasional fatigue.  The skin on her left breast has hyperpigmentation. Also, her left subclavian area has hyperpigmentation with a small area that looks like a blister.  She is using radiaplex gel and needs a refill.  Another tube has been given.  BP 122/79 mmHg  Pulse 95  Temp(Src) 98.1 F (36.7 C) (Oral)  Resp 16  Ht 5\' 1"  (1.549 m)  Wt 231 lb 6.4 oz (104.962 kg)  BMI 43.75 kg/m2

## 2014-12-27 NOTE — Progress Notes (Signed)
  Radiation Oncology         [phone] ________________________________  Name: [name] MRN: [id number]  Date: 12/27/2014  DOB: March 16, 1951  Weekly Radiation Therapy Management   DIAGNOSIS: Clinical stage III, high-grade triple negative left breast cancer (ypT0, ypN0)  Current Dose: 53 Gy     Planned Dose:  61 Gy  Narrative . . . . . . . . The patient presents for routine under treatment assessment.                                   The patient is without complaint. She does notice some occasional fatigue. She does have some soreness around the nipple area.                                 Set-up films were reviewed.                                 The chart was checked. Physical Findings. . .  height is 5\' 1"  (1.549 m) and weight is 231 lb 6.4 oz (104.962 kg). Her oral temperature is 98.1 F (36.7 C). Her blood pressure is 122/79 and her pulse is 95. Her respiration is 16. . Weight essentially stable. Hyperpigmentation changes throughout the left breast and some dry desquamation but no moist desquamation. Impression . . . . . . . The patient is tolerating radiation. Plan . . . . . . . . . . . . Continue treatment as planned.  ________________________________   Blair Promise, PhD, MD

## 2014-12-28 ENCOUNTER — Ambulatory Visit
Admission: RE | Admit: 2014-12-28 | Discharge: 2014-12-28 | Disposition: A | Discharge status: Home / Self Care | Payer: Medicare Other | Source: Ambulatory Visit | Attending: Radiation Oncology | Admitting: Radiation Oncology

## 2014-12-28 ENCOUNTER — Ambulatory Visit: Payer: Medicare Other

## 2014-12-28 DIAGNOSIS — Z9012 Acquired absence of left breast and nipple: Secondary | ICD-10-CM | POA: Diagnosis not present

## 2014-12-28 DIAGNOSIS — Z171 Estrogen receptor negative status [ER-]: Secondary | ICD-10-CM | POA: Diagnosis not present

## 2014-12-28 DIAGNOSIS — C50412 Malignant neoplasm of upper-outer quadrant of left female breast: Secondary | ICD-10-CM | POA: Diagnosis not present

## 2014-12-28 DIAGNOSIS — Z510 Encounter for antineoplastic radiation therapy: Secondary | ICD-10-CM | POA: Diagnosis not present

## 2014-12-28 DIAGNOSIS — Z90710 Acquired absence of both cervix and uterus: Secondary | ICD-10-CM | POA: Diagnosis not present

## 2014-12-29 ENCOUNTER — Ambulatory Visit: Payer: Medicare Other

## 2014-12-29 ENCOUNTER — Ambulatory Visit
Admission: RE | Admit: 2014-12-29 | Discharge: 2014-12-29 | Disposition: A | Discharge status: Home / Self Care | Payer: Medicare Other | Source: Ambulatory Visit | Attending: Radiation Oncology | Admitting: Radiation Oncology

## 2014-12-29 DIAGNOSIS — C50412 Malignant neoplasm of upper-outer quadrant of left female breast: Secondary | ICD-10-CM | POA: Diagnosis not present

## 2014-12-29 DIAGNOSIS — Z90710 Acquired absence of both cervix and uterus: Secondary | ICD-10-CM | POA: Diagnosis not present

## 2014-12-29 DIAGNOSIS — Z171 Estrogen receptor negative status [ER-]: Secondary | ICD-10-CM | POA: Diagnosis not present

## 2014-12-29 DIAGNOSIS — Z510 Encounter for antineoplastic radiation therapy: Secondary | ICD-10-CM | POA: Diagnosis not present

## 2014-12-29 DIAGNOSIS — Z9012 Acquired absence of left breast and nipple: Secondary | ICD-10-CM | POA: Diagnosis not present

## 2015-01-01 ENCOUNTER — Ambulatory Visit
Admission: RE | Admit: 2015-01-01 | Discharge: 2015-01-01 | Disposition: A | Discharge status: Home / Self Care | Payer: Medicare Other | Source: Ambulatory Visit | Attending: Radiation Oncology | Admitting: Radiation Oncology

## 2015-01-01 ENCOUNTER — Ambulatory Visit: Payer: Medicare Other

## 2015-01-01 DIAGNOSIS — Z171 Estrogen receptor negative status [ER-]: Secondary | ICD-10-CM | POA: Diagnosis not present

## 2015-01-01 DIAGNOSIS — Z510 Encounter for antineoplastic radiation therapy: Secondary | ICD-10-CM | POA: Diagnosis not present

## 2015-01-01 DIAGNOSIS — Z9012 Acquired absence of left breast and nipple: Secondary | ICD-10-CM | POA: Diagnosis not present

## 2015-01-01 DIAGNOSIS — C50412 Malignant neoplasm of upper-outer quadrant of left female breast: Secondary | ICD-10-CM | POA: Diagnosis not present

## 2015-01-01 DIAGNOSIS — Z90710 Acquired absence of both cervix and uterus: Secondary | ICD-10-CM | POA: Diagnosis not present

## 2015-01-02 ENCOUNTER — Ambulatory Visit
Admission: RE | Admit: 2015-01-02 | Discharge: 2015-01-02 | Disposition: A | Discharge status: Home / Self Care | Payer: Medicare Other | Source: Ambulatory Visit | Attending: Radiation Oncology | Admitting: Radiation Oncology

## 2015-01-02 ENCOUNTER — Encounter: Payer: Self-pay | Admitting: Radiation Oncology

## 2015-01-02 ENCOUNTER — Ambulatory Visit: Payer: Medicare Other

## 2015-01-02 VITALS — BP 121/79 | HR 101 | Temp 98.6°F | Resp 12 | Ht 61.0 in | Wt 232.6 lb

## 2015-01-02 DIAGNOSIS — C50412 Malignant neoplasm of upper-outer quadrant of left female breast: Secondary | ICD-10-CM | POA: Diagnosis not present

## 2015-01-02 NOTE — Progress Notes (Signed)
[name] has completed treatment with 33 fractions to her left breast.  She reports fatigue.  She also reports discomfort behind her left nipple area.  She has been given a one month follow up appointment card.  The skin on her left subclavian and breast has hyperpigmentation.  She has peeling areas under her left breast and arm and on her left subclavian area.  She is using radiaplex gel.  BP 121/79 mmHg  Pulse 101  Temp(Src) 98.6 F (37 C) (Oral)  Resp 12  Ht 5\' 1"  (1.549 m)  Wt 232 lb 9.6 oz (105.507 kg)  BMI 43.97 kg/m2

## 2015-01-02 NOTE — Progress Notes (Signed)
  Radiation Oncology         [phone] ________________________________  Name: [name] MRN: [id number]  Date: 01/02/2015  DOB: 1951/02/17  Weekly Radiation Therapy Management  DIAGNOSIS: Clinical stage III, high-grade triple negative left breast cancer (ypT0, ypN0)  Current Dose: 61 Gy     Planned Dose:  61 Gy  Narrative . . . . . . . . The patient presents for routine under treatment assessment.                                   The patient happy to complete her radiation therapy. She does have some soreness after clearing of the nipple area.                                 Set-up films were reviewed.                                 The chart was checked. Physical Findings. . .  height is 5\' 1"  (1.549 m) and weight is 232 lb 9.6 oz (105.507 kg). Her oral temperature is 98.6 F (37 C). Her blood pressure is 121/79 and her pulse is 101. Her respiration is 12. . Weight essentially stable. The left breast area shows hyperpigmentation changes and dry desquamation but no moist desquamation Impression . . . . . . . The patient is tolerating radiation. Plan . . . . . . . . . . . . Routine follow-up in one month  ________________________________   Blair Promise, PhD, MD

## 2015-01-03 ENCOUNTER — Ambulatory Visit: Payer: Medicare Other

## 2015-01-05 ENCOUNTER — Ambulatory Visit (HOSPITAL_BASED_OUTPATIENT_CLINIC_OR_DEPARTMENT_OTHER): Payer: Medicare Other

## 2015-01-05 VITALS — BP 144/87 | HR 91 | Temp 98.5°F

## 2015-01-05 DIAGNOSIS — Z452 Encounter for adjustment and management of vascular access device: Secondary | ICD-10-CM | POA: Diagnosis not present

## 2015-01-05 DIAGNOSIS — C50812 Malignant neoplasm of overlapping sites of left female breast: Secondary | ICD-10-CM

## 2015-01-05 DIAGNOSIS — Z95828 Presence of other vascular implants and grafts: Secondary | ICD-10-CM

## 2015-01-05 MED ORDER — HEPARIN SOD (PORK) LOCK FLUSH 100 UNIT/ML IV SOLN
500.0000 [IU] | Freq: Once | INTRAVENOUS | Status: AC
  Administered 2015-01-05: 500 [IU] via INTRAVENOUS
  Filled 2015-01-05: qty 5

## 2015-01-05 MED ORDER — SODIUM CHLORIDE 0.9 % IJ SOLN
10.0000 mL | INTRAMUSCULAR | Status: DC | PRN
  Administered 2015-01-05: 10 mL via INTRAVENOUS
  Filled 2015-01-05: qty 10

## 2015-01-05 NOTE — Patient Instructions (Signed)

## 2015-01-05 NOTE — Progress Notes (Incomplete)
°  Radiation Oncology         [phone] ________________________________  Name: [name] MRN: [id number]  Date: 01/02/2015  DOB: 10-Jun-1951  End of Treatment Note   ICD-9-CM ICD-10-CM    1. Breast cancer of upper-outer quadrant of left female breast 174.4 C50.412     DIAGNOSIS: Clinical stage III, high-grade triple negative left breast cancer (ypT0, ypN0)     Indication for treatment:  ***       Radiation treatment dates:   11/15/2014-01/02/2015  Site/dose:   ***  Beams/energy:   ***  Narrative: The patient tolerated radiation treatment relatively well.   ***  Plan: The patient has completed radiation treatment. The patient will return to radiation oncology clinic for routine followup in one month. I advised them to call or return sooner if they have any questions or concerns related to their recovery or treatment.  -----------------------------------  Blair Promise, PhD, MD

## 2015-01-08 ENCOUNTER — Encounter: Payer: Self-pay | Admitting: Radiation Oncology

## 2015-01-08 NOTE — Progress Notes (Signed)
  Radiation Oncology         [phone] ________________________________  Name: [name] MRN: [id number]  Date: 01/08/2015  DOB: 02-Jul-1951  End of Treatment Note  Diagnosis:   Clinical stage III, high-grade triple negative left breast cancer (ypT0, ypN0)   Indication for treatment:  Breast conservation therapy and elective coverage of the axillary region      Radiation treatment dates:   March 2 through April 19  Site/dose:  Left breast, axillary and supraclavicular region 45 gray in 25 fractions; lumpectomy cavity boost 16 gray in 8 fractions, cumulative dose to the lumpectomy cavity of 61 gray  Beams/energy:   3-D conformal for initial set up, 18 MeV electrons for lumpectomy cavity boost  Narrative: The patient tolerated radiation treatment relatively well.   Patient did experience some itching and discomfort within the breast area but no significant problems with moist desquamation  Plan: The patient has completed radiation treatment. The patient will return to radiation oncology clinic for routine followup in one month. I advised them to call or return sooner if they have any questions or concerns related to their recovery or treatment.  -----------------------------------  Blair Promise, PhD, MD

## 2015-01-08 NOTE — Progress Notes (Signed)
  Radiation Oncology         [phone] ________________________________  Name: [name] MRN: [id number]  Date: 12/08/14  DOB: 1951/04/13  Electron beam simulation Note  Diagnosis:   Left breast cancer          Narrative: On 12/07/2013 the patient underwent additional planning for radiation therapy directed at the left breast area the patient's treatment planning CT scan was reviewed and the patient had set up of a custom electron cutout field directed at the lumpectomy cavity. The patient will be treated with 18 MeV electrons. Boost dose will be 16 gray in 8 fractions.  a special port plan is requested for treatment -----------------------------------  Blair Promise, PhD, MD

## 2015-01-31 ENCOUNTER — Ambulatory Visit (HOSPITAL_BASED_OUTPATIENT_CLINIC_OR_DEPARTMENT_OTHER): Payer: Medicare Other

## 2015-01-31 ENCOUNTER — Other Ambulatory Visit (HOSPITAL_BASED_OUTPATIENT_CLINIC_OR_DEPARTMENT_OTHER): Payer: Medicare Other

## 2015-01-31 VITALS — BP 107/84 | HR 87 | Temp 98.5°F

## 2015-01-31 DIAGNOSIS — C50812 Malignant neoplasm of overlapping sites of left female breast: Secondary | ICD-10-CM | POA: Diagnosis not present

## 2015-01-31 DIAGNOSIS — C50412 Malignant neoplasm of upper-outer quadrant of left female breast: Secondary | ICD-10-CM

## 2015-01-31 DIAGNOSIS — Z95828 Presence of other vascular implants and grafts: Secondary | ICD-10-CM

## 2015-01-31 LAB — CBC WITH DIFFERENTIAL/PLATELET
BASO%: 1 % (ref 0.0–2.0)
Basophils Absolute: 0 10*3/uL (ref 0.0–0.1)
EOS%: 3.6 % (ref 0.0–7.0)
Eosinophils Absolute: 0.1 10*3/uL (ref 0.0–0.5)
HEMATOCRIT: 36.5 % (ref 34.8–46.6)
HGB: 12 g/dL (ref 11.6–15.9)
LYMPH%: 20.9 % (ref 14.0–49.7)
MCH: 29 pg (ref 25.1–34.0)
MCHC: 32.9 g/dL (ref 31.5–36.0)
MCV: 88.3 fL (ref 79.5–101.0)
MONO#: 0.3 10*3/uL (ref 0.1–0.9)
MONO%: 11.1 % (ref 0.0–14.0)
NEUT#: 1.8 10*3/uL (ref 1.5–6.5)
NEUT%: 63.4 % (ref 38.4–76.8)
Platelets: 233 10*3/uL (ref 145–400)
RBC: 4.14 10*6/uL (ref 3.70–5.45)
RDW: 14.7 % — AB (ref 11.2–14.5)
WBC: 2.9 10*3/uL — AB (ref 3.9–10.3)
lymph#: 0.6 10*3/uL — ABNORMAL LOW (ref 0.9–3.3)

## 2015-01-31 LAB — COMPREHENSIVE METABOLIC PANEL (CC13)
ALT: 98 U/L — AB (ref 0–55)
AST: 58 U/L — ABNORMAL HIGH (ref 5–34)
Albumin: 3.3 g/dL — ABNORMAL LOW (ref 3.5–5.0)
Alkaline Phosphatase: 243 U/L — ABNORMAL HIGH (ref 40–150)
Anion Gap: 10 mEq/L (ref 3–11)
BILIRUBIN TOTAL: 1.32 mg/dL — AB (ref 0.20–1.20)
BUN: 9.7 mg/dL (ref 7.0–26.0)
CALCIUM: 9.6 mg/dL (ref 8.4–10.4)
CO2: 24 mEq/L (ref 22–29)
CREATININE: 0.8 mg/dL (ref 0.6–1.1)
Chloride: 106 mEq/L (ref 98–109)
Glucose: 127 mg/dl (ref 70–140)
Potassium: 3.9 mEq/L (ref 3.5–5.1)
SODIUM: 140 meq/L (ref 136–145)
TOTAL PROTEIN: 7.2 g/dL (ref 6.4–8.3)

## 2015-01-31 MED ORDER — HEPARIN SOD (PORK) LOCK FLUSH 100 UNIT/ML IV SOLN
500.0000 [IU] | Freq: Once | INTRAVENOUS | Status: AC
  Administered 2015-01-31: 500 [IU] via INTRAVENOUS
  Filled 2015-01-31: qty 5

## 2015-01-31 MED ORDER — SODIUM CHLORIDE 0.9 % IJ SOLN
10.0000 mL | INTRAMUSCULAR | Status: DC | PRN
  Administered 2015-01-31: 10 mL via INTRAVENOUS
  Filled 2015-01-31: qty 10

## 2015-01-31 NOTE — Patient Instructions (Signed)

## 2015-02-01 ENCOUNTER — Other Ambulatory Visit: Payer: Self-pay | Admitting: Nurse Practitioner

## 2015-02-01 DIAGNOSIS — R945 Abnormal results of liver function studies: Principal | ICD-10-CM

## 2015-02-01 DIAGNOSIS — C50412 Malignant neoplasm of upper-outer quadrant of left female breast: Secondary | ICD-10-CM

## 2015-02-01 DIAGNOSIS — R7989 Other specified abnormal findings of blood chemistry: Secondary | ICD-10-CM

## 2015-02-02 ENCOUNTER — Telehealth: Payer: Self-pay | Admitting: Nurse Practitioner

## 2015-02-02 NOTE — Telephone Encounter (Signed)
pof noted with mri order and central scheduling will call the patient

## 2015-02-05 ENCOUNTER — Telehealth: Payer: Self-pay | Admitting: Oncology

## 2015-02-05 ENCOUNTER — Other Ambulatory Visit: Payer: Self-pay | Admitting: *Deleted

## 2015-02-05 ENCOUNTER — Other Ambulatory Visit: Payer: Self-pay | Admitting: Oncology

## 2015-02-05 ENCOUNTER — Telehealth: Payer: Self-pay | Admitting: *Deleted

## 2015-02-05 NOTE — Telephone Encounter (Signed)
s.w. pt and advised on June appt....pt ok and aware °

## 2015-02-05 NOTE — Telephone Encounter (Signed)
Call back to pt with bchange in appt. Date/time. Identified correct VM and message left

## 2015-02-05 NOTE — Telephone Encounter (Signed)
VM message from patient stating that is unable to get her MRI until 02/13/15 but has appt with Dr. Jana Hakim on 02/07/15.  She would like to know if her appt with Dr. Jana Hakim needs to be re-scheduled to after her MRI.

## 2015-02-05 NOTE — Telephone Encounter (Signed)
Ideally we would move appt back but I'm booked through June-- so will cancel her appt and schedule w heather my extender for mid June  POF entered-- please let pt know

## 2015-02-05 NOTE — Telephone Encounter (Signed)
Returned Advertising account executive. Left message to confirmed if needed to reschedule appointment.

## 2015-02-05 NOTE — Telephone Encounter (Signed)
POF sent with [facility] to reschedule follow up post MRI of liver.

## 2015-02-07 ENCOUNTER — Encounter: Payer: Self-pay | Admitting: Oncology

## 2015-02-07 ENCOUNTER — Ambulatory Visit: Payer: Medicare Other | Admitting: Oncology

## 2015-02-08 ENCOUNTER — Encounter: Payer: Self-pay | Admitting: Radiation Oncology

## 2015-02-08 ENCOUNTER — Ambulatory Visit
Admission: RE | Admit: 2015-02-08 | Discharge: 2015-02-08 | Disposition: A | Discharge status: Home / Self Care | Payer: Medicare Other | Source: Ambulatory Visit | Attending: Radiation Oncology | Admitting: Radiation Oncology

## 2015-02-08 VITALS — BP 125/81 | HR 91 | Temp 98.3°F | Resp 20 | Ht 61.0 in | Wt 229.2 lb

## 2015-02-08 DIAGNOSIS — C50412 Malignant neoplasm of upper-outer quadrant of left female breast: Secondary | ICD-10-CM

## 2015-02-08 NOTE — Progress Notes (Signed)
[name] here for follow up.  She denies pain except for occasional sharp pains in her left breast.  She reports fatigue.  She has lost 3 lbs and is trying to loose weight.  The skin on her left breast has hyperpigmentation.  She continues to use radiaplex. She reports she is going to have an MRI next Tuesday.  She says her liver function was elevated on her labs from 01/31/15.  BP 125/81 mmHg  Pulse 91  Temp(Src) 98.3 F (36.8 C) (Oral)  Resp 20  Ht 5\' 1"  (1.549 m)  Wt 229 lb 3.2 oz (103.964 kg)  BMI 43.33 kg/m2

## 2015-02-08 NOTE — Progress Notes (Signed)
Radiation Oncology         [phone] ________________________________  Name: [name] MRN: [id number]  Date: 02/08/2015  DOB: 02-Nov-1950  Follow-Up Visit Note  CC: Maximino Greenland, MD  Rolm Bookbinder, MD  Diagnosis:  Clinical stage III, high-grade triple negative (ypT0, ypN0)   Narrative:   Tresa Res here for follow up.One month out from tx. She denies pain except for occasional sharp pains in her left breast. She reports fatigue. She has lost 3 lbs and is trying to loose weight. The skin on her left breast has hyperpigmentation. She continues to use radiaplex. She reports she is going to have an MRI next Tuesday. She says her liver function was elevated on her labs from 01/31/15.  ALLERGIES:  has No Known Allergies.  Meds: Current Outpatient Prescriptions  Medication Sig Dispense Refill  . gabapentin (NEURONTIN) 300 MG capsule Take 1 capsule (300 mg total) by mouth at bedtime. 90 capsule 4  . loteprednol (LOTEMAX) 0.5 % ophthalmic suspension Place 1 drop into both eyes 3 (three) times daily.    . potassium chloride (K-DUR) 10 MEQ tablet Take 1 tablet (10 mEq total) by mouth 2 (two) times daily. 60 tablet 4  . acetaminophen (TYLENOL) 500 MG tablet Take 1,000 mg by mouth every 6 (six) hours as needed for mild pain or moderate pain.    Marland Kitchen dexamethasone (DECADRON) 4 MG tablet Take 2 tablets by mouth once a day on the day after chemotherapy and then take 2 tablets two times a day for 2 days. Take with food. (Patient not taking: Reported on 10/04/2014) 30 tablet 1  . hyaluronate sodium (RADIAPLEXRX) GEL Apply 1 application topically once.    . lidocaine-prilocaine (EMLA) cream Apply 1 application topically once. Apply to port 1-2 hours prior to access. (Patient not taking: Reported on 11/21/2014) 30 g 1  . non-metallic deodorant (ALRA) MISC Apply 1 application topically daily as needed.    . ondansetron (ZOFRAN) 8 MG tablet Take 1 tablet (8 mg total) by mouth 2 (two)  times daily as needed. Start on the third day after chemotherapy. (Patient not taking: Reported on 10/04/2014) 30 tablet 0  . oxyCODONE-acetaminophen (PERCOCET) 10-325 MG per tablet Take 1 tablet by mouth every 6 (six) hours as needed for pain. (Patient not taking: Reported on 11/21/2014) 20 tablet 0  . prochlorperazine (COMPAZINE) 10 MG tablet Take 1 tablet (10 mg total) by mouth every 6 (six) hours as needed for nausea or vomiting. (Patient not taking: Reported on 08/14/2014) 30 tablet 0  . tobramycin-dexamethasone (TOBRADEX) ophthalmic solution Place 1 drop into both eyes every 4 (four) hours while awake. (Patient not taking: Reported on 12/19/2014) 5 mL 0   No current facility-administered medications for this encounter.    Physical Findings: The patient is in no acute distress. Patient is alert and oriented.  Lungs are clear. Heart has regular rate and rhythm. No palpable cervical, supraclavicular, or axillary adenopathy.  BP 125/81 mmHg  Pulse 91  Temp(Src) 98.3 F (36.8 C) (Oral)  Resp 20  Ht 5\' 1"  (1.549 m)  Wt 229 lb 3.2 oz (103.964 kg)  BMI 43.33 kg/m2. The left breast area shows hyperpigmentation changes. No skin breakdown. No dominant mass within the left breast.  Lab Findings: Lab Results  Component Value Date   WBC 2.9* 01/31/2015   HGB 12.0 01/31/2015   HCT 36.5 01/31/2015   MCV 88.3 01/31/2015   PLT 233 01/31/2015    Radiographic Findings: No results found.  Impression:  No reoccurrence of disease. Patient is doing well.    Plan:   Continue Close follow up with medical oncology. Follow up with radiation oncology PRN.   This document serves as a record of services personally performed by Gery Pray, MD. It was created on his behalf by Jeralene Peters, a trained medical scribe. The creation of this record is based on the scribe's personal observations and the provider's statements to them. This document has been checked and approved by the attending provider.       ____________________________________ Blair Promise, PhD., MD

## 2015-02-13 ENCOUNTER — Ambulatory Visit (HOSPITAL_COMMUNITY)
Admission: RE | Admit: 2015-02-13 | Discharge: 2015-02-13 | Disposition: A | Discharge status: Home / Self Care | Payer: Medicare Other | Source: Ambulatory Visit | Attending: Nurse Practitioner | Admitting: Nurse Practitioner

## 2015-02-13 ENCOUNTER — Other Ambulatory Visit: Payer: Self-pay | Admitting: Nurse Practitioner

## 2015-02-13 DIAGNOSIS — R7989 Other specified abnormal findings of blood chemistry: Secondary | ICD-10-CM

## 2015-02-13 DIAGNOSIS — C50412 Malignant neoplasm of upper-outer quadrant of left female breast: Secondary | ICD-10-CM

## 2015-02-13 DIAGNOSIS — R945 Abnormal results of liver function studies: Principal | ICD-10-CM

## 2015-02-14 ENCOUNTER — Other Ambulatory Visit: Payer: Self-pay | Admitting: Nurse Practitioner

## 2015-02-15 ENCOUNTER — Other Ambulatory Visit: Payer: Self-pay

## 2015-02-15 DIAGNOSIS — C50412 Malignant neoplasm of upper-outer quadrant of left female breast: Secondary | ICD-10-CM

## 2015-02-15 MED ORDER — LORAZEPAM 2 MG PO TABS: 1.0000 mg | ORAL_TABLET | ORAL | Status: DC

## 2015-02-15 NOTE — Progress Notes (Signed)
Spoke with patient 02/15/15 15:55.  Let her know to expect central scheduling to call with new MRI d/t.  Let her know Ativan called in to [facility], she will need to pick it up in time to take it 30 min prior to MRI.  Let pt know she would need a drive.  Pt voiced understanding.    Inbasket sent to [facility] Goad re: need for authorization.

## 2015-02-16 ENCOUNTER — Telehealth: Payer: Self-pay | Admitting: Oncology

## 2015-02-16 NOTE — Telephone Encounter (Signed)
pof mri order noted and patient will get a call from central scheduling

## 2015-02-19 ENCOUNTER — Ambulatory Visit: Payer: Medicare Other | Admitting: Oncology

## 2015-02-23 ENCOUNTER — Other Ambulatory Visit: Payer: Self-pay | Admitting: Nurse Practitioner

## 2015-03-01 ENCOUNTER — Ambulatory Visit (HOSPITAL_COMMUNITY)
Admission: RE | Admit: 2015-03-01 | Discharge: 2015-03-01 | Disposition: A | Discharge status: Home / Self Care | Payer: Medicare Other | Source: Ambulatory Visit | Attending: Nurse Practitioner | Admitting: Nurse Practitioner

## 2015-03-01 DIAGNOSIS — R945 Abnormal results of liver function studies: Secondary | ICD-10-CM | POA: Diagnosis not present

## 2015-03-01 DIAGNOSIS — Z171 Estrogen receptor negative status [ER-]: Secondary | ICD-10-CM | POA: Diagnosis not present

## 2015-03-01 DIAGNOSIS — C50412 Malignant neoplasm of upper-outer quadrant of left female breast: Secondary | ICD-10-CM

## 2015-03-01 DIAGNOSIS — R7989 Other specified abnormal findings of blood chemistry: Secondary | ICD-10-CM | POA: Insufficient documentation

## 2015-03-01 DIAGNOSIS — C50919 Malignant neoplasm of unspecified site of unspecified female breast: Secondary | ICD-10-CM | POA: Diagnosis not present

## 2015-03-01 MED ORDER — GADOBENATE DIMEGLUMINE 529 MG/ML IV SOLN
20.0000 mL | Freq: Once | INTRAVENOUS | Status: AC | PRN
  Administered 2015-03-01: 20 mL via INTRAVENOUS

## 2015-03-08 ENCOUNTER — Ambulatory Visit (HOSPITAL_BASED_OUTPATIENT_CLINIC_OR_DEPARTMENT_OTHER): Payer: Medicare Other | Admitting: Nurse Practitioner

## 2015-03-08 ENCOUNTER — Ambulatory Visit (HOSPITAL_BASED_OUTPATIENT_CLINIC_OR_DEPARTMENT_OTHER): Payer: Medicare Other

## 2015-03-08 ENCOUNTER — Other Ambulatory Visit (HOSPITAL_BASED_OUTPATIENT_CLINIC_OR_DEPARTMENT_OTHER): Payer: Medicare Other

## 2015-03-08 ENCOUNTER — Telehealth: Payer: Self-pay | Admitting: Oncology

## 2015-03-08 ENCOUNTER — Other Ambulatory Visit: Payer: Self-pay | Admitting: Nurse Practitioner

## 2015-03-08 ENCOUNTER — Encounter: Payer: Self-pay | Admitting: Nurse Practitioner

## 2015-03-08 VITALS — BP 145/74 | HR 89 | Temp 98.0°F | Resp 18 | Ht 62.0 in | Wt 221.7 lb

## 2015-03-08 DIAGNOSIS — C773 Secondary and unspecified malignant neoplasm of axilla and upper limb lymph nodes: Secondary | ICD-10-CM

## 2015-03-08 DIAGNOSIS — Z95828 Presence of other vascular implants and grafts: Secondary | ICD-10-CM

## 2015-03-08 DIAGNOSIS — C50412 Malignant neoplasm of upper-outer quadrant of left female breast: Secondary | ICD-10-CM | POA: Diagnosis not present

## 2015-03-08 DIAGNOSIS — Z171 Estrogen receptor negative status [ER-]: Secondary | ICD-10-CM | POA: Diagnosis not present

## 2015-03-08 DIAGNOSIS — R945 Abnormal results of liver function studies: Secondary | ICD-10-CM

## 2015-03-08 DIAGNOSIS — R7989 Other specified abnormal findings of blood chemistry: Secondary | ICD-10-CM | POA: Diagnosis not present

## 2015-03-08 DIAGNOSIS — Z853 Personal history of malignant neoplasm of breast: Secondary | ICD-10-CM

## 2015-03-08 LAB — COMPREHENSIVE METABOLIC PANEL (CC13)
ALBUMIN: 3.4 g/dL — AB (ref 3.5–5.0)
ALT: 52 U/L (ref 0–55)
AST: 36 U/L — ABNORMAL HIGH (ref 5–34)
Alkaline Phosphatase: 238 U/L — ABNORMAL HIGH (ref 40–150)
Anion Gap: 6 mEq/L (ref 3–11)
BILIRUBIN TOTAL: 1.02 mg/dL (ref 0.20–1.20)
BUN: 11.7 mg/dL (ref 7.0–26.0)
CO2: 25 mEq/L (ref 22–29)
Calcium: 9.8 mg/dL (ref 8.4–10.4)
Chloride: 109 mEq/L (ref 98–109)
Creatinine: 0.9 mg/dL (ref 0.6–1.1)
EGFR: 78 mL/min/{1.73_m2} — AB (ref >90)
Glucose: 101 mg/dl (ref 70–140)
POTASSIUM: 4 meq/L (ref 3.5–5.1)
Sodium: 141 mEq/L (ref 136–145)
TOTAL PROTEIN: 7.5 g/dL (ref 6.4–8.3)

## 2015-03-08 LAB — CBC WITH DIFFERENTIAL/PLATELET
BASO%: 0.5 % (ref 0.0–2.0)
Basophils Absolute: 0 10*3/uL (ref 0.0–0.1)
EOS ABS: 0.2 10*3/uL (ref 0.0–0.5)
EOS%: 6.5 % (ref 0.0–7.0)
HCT: 37 % (ref 34.8–46.6)
HGB: 12.3 g/dL (ref 11.6–15.9)
LYMPH%: 30.1 % (ref 14.0–49.7)
MCH: 29.9 pg (ref 25.1–34.0)
MCHC: 33.2 g/dL (ref 31.5–36.0)
MCV: 90 fL (ref 79.5–101.0)
MONO#: 0.3 10*3/uL (ref 0.1–0.9)
MONO%: 8.9 % (ref 0.0–14.0)
NEUT#: 2 10*3/uL (ref 1.5–6.5)
NEUT%: 54 % (ref 38.4–76.8)
PLATELETS: 220 10*3/uL (ref 145–400)
RBC: 4.11 10*6/uL (ref 3.70–5.45)
RDW: 13.8 % (ref 11.2–14.5)
WBC: 3.7 10*3/uL — ABNORMAL LOW (ref 3.9–10.3)
lymph#: 1.1 10*3/uL (ref 0.9–3.3)

## 2015-03-08 MED ORDER — HEPARIN SOD (PORK) LOCK FLUSH 100 UNIT/ML IV SOLN
500.0000 [IU] | Freq: Once | INTRAVENOUS | Status: AC
  Administered 2015-03-08: 500 [IU] via INTRAVENOUS
  Filled 2015-03-08: qty 5

## 2015-03-08 MED ORDER — SODIUM CHLORIDE 0.9 % IJ SOLN
10.0000 mL | INTRAMUSCULAR | Status: DC | PRN
  Administered 2015-03-08: 10 mL via INTRAVENOUS
  Filled 2015-03-08: qty 10

## 2015-03-08 NOTE — Telephone Encounter (Signed)
Gave patient avs report and appointments for August and September. Patient also given mammo for January 2017. Per Lonzo Candy at [facility] - patient last mammo was December 2015 and she doesn't see a reason for [facility] June 2016. Checked with HF and per HF January ok - patient was dx in June, however she must have had another mammo in December 2015 for some other reason. Patient aware January 2017 ok per HF. Spoke with Tiffany in IR re port removal and per Tiffany she will contact patient with appointment - patient aware.

## 2015-03-08 NOTE — Progress Notes (Addendum)
[place]  [phone] [phone]     ID: MARGRET MOAT DOB: September 10, 1951  MR#: [id number]  DXA#:[id number]  PCP: Maximino Greenland, MD GYN: SU: Rolm Bookbinder OTHER MD: Gery Pray  CHIEF COMPLAINT: Stage III breast cancer, triple negative   CURRENT TREATMENT: awaiting adjuvant radiation  BREAST CANCER HISTORY: From the original intake note:  Kasaundra herself palpated a mass in her left breast, and immediately brought it to the attention of her primary care physician, Dr. Baird Cancer, who confirmed the finding and setup the patient for bilateral diagnostic mammography at the [facility] Center 02/20/2014. This showed a high density mass in the outer left breast measuring up to 3.7 cm. It corresponded to the site of palpable concern. In addition the normal 4 logically abnormal lymph nodes in the left axilla. The mass was palpable by exam, and by ultrasonography measured 2.6 cm. There were enlarged and morphologically abnormal lymph nodes in the left axilla, largest measuring 3 cm.  Biopsy of the left breast mass in question and 1 of the abnormal axillary lymph nodes 02/22/2014 showed ([id number]) biopsies to be positive for an invasive ductal carcinoma, grade 3, triple negative, with an MIB-1 of 90%. Specifically the HER-2 signals ratio was 1.05, and the number per cell 2.00.  MRI of the breasts 03/01/2014 showed a 3.8 cm enhancing mass with areas of apparent necrosis in the left breast, as well as the biopsy tract extending laterally, the combined measure 5.3 cm. There were no other masses or areas of abnormal enhancement. There were multiple abnormally enlarged left axillary lymph nodes with loss of the normal fatty hilum. These included level I and retropectoral lymph nodes.  The patient's subsequent history is as detailed below   INTERVAL HISTORY: Deshawn returns today for follow up of her breast cancer. Since her last visit, she has completed radiation. She  had labs drawn prior to her next office visit with US demonstrated abnormal values in all of her liver function tests. Given her history of triple negative breast cancer, we believed an MRI of the liver was warranted.   REVIEW OF SYSTEMS: Xandrea denies fevers, chills, nausea, vomiting, or change sin bowel or bladder habits. She denies abdominal pain or discomfort. She has no evidence of jaundice. She is eating and drinking well. She still has some fatigue from radiation. 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 She denies headaches, dizziness, weakness, or vision changes. She has shortness of breath with exertion, but denies cough, palpitations, or chest pain. She has been active at the gym weekly. A detailed review of systems is otherwise stable.  PAST MEDICAL HISTORY: Past Medical History  Diagnosis Date  . Hx of rotator cuff surgery   . Colon cancer   . left breast cancer   . Radiation 11/15/14-01/02/15    left breast, axillary and supraclavicular region 45 gray, lumpectomy cavity boosted to 16 gray    PAST SURGICAL HISTORY: Past Surgical History  Procedure Laterality Date  . Abdominal hysterectomy  1988  . Colon surgery  1988    colectomy/colostomy-ca  . Colon surgery  1988    colostomy takedown-reversal  . Colonoscopy    . Total shoulder arthroplasty  2009    right  . Radioactive seed guided mastectomy with axillary sentinel lymph node biopsy Left 09/20/2014    Procedure: RADIOACTIVE SEED GUIDED LEFT BREAST LUMPECTOMY WITH AXILLARY SENTINEL LYMPH NODE BIOPSY;  Surgeon: Rolm Bookbinder, MD;  Location: [facility];  Service: General;  Laterality: Left;    FAMILY HISTORY Family History  Problem Relation Age of Onset  . Cancer Mother 63    breast  . Cancer Maternal Aunt     breast cancer at unknown age   the patient's father died at the age of 66 following a stroke in the setting of diabetes; the patient's mother is living at  4. The patient has 3 brothers and 2 sisters. [name], [name], was diagnosed with breast cancer in her early 77s. There is no other breast or ovarian cancer in the family. The patient herself was diagnosed with watts by history he is an incidentally found stage I colon carcinoma noted at the time of her hysterectomy. She had a partial colectomy but no adjuvant treatment. We have no records regarding that procedure  GYNECOLOGIC HISTORY:  No LMP recorded. Patient has had a hysterectomy. Menarche age 71, first live birth age 35, the patient underwent total abdominal hysterectomy with bilateral salpingo-oophorectomy in her 36s. She did not take hormone replacement. She did not take oral contraceptives at any point.  SOCIAL HISTORY:  Demarie work for [employer] for about 40 years, retiring in 2008. She is divorced and lives alone, with no pets. Her daughter Maizey Menendez works for [employer] in Press photographer. The second daughter, Nimra Puccinelli, works as a Research scientist (physical sciences) for [employer]. The patient has 6 grandchildren and one great-grandchild. She attends a [employer].    ADVANCED DIRECTIVES: Not in place. The patient intends to name her daughter [name]. her healthcare power of attorney. [name] is work number is [id number]. On the patient's 03/01/2014 visit she was given a copy of the appropriate documents to complete and notarize at her discretion   HEALTH MAINTENANCE: History  Substance Use Topics  . Smoking status: Never Smoker   . Smokeless tobacco: Not on file  . Alcohol Use: Yes     Comment: occ-rare     Colonoscopy: 2000  PAP: May 2015  Bone density: 05/23/2004, at [facility]; normal  Lipid panel:  No Known Allergies  Current Outpatient Prescriptions  Medication Sig Dispense Refill  . gabapentin (NEURONTIN) 300 MG capsule Take 1 capsule (300 mg total) by mouth at bedtime. 90 capsule 4  . loteprednol (LOTEMAX) 0.5 % ophthalmic suspension Place 1 drop into both  eyes 3 (three) times daily.    . potassium chloride (K-DUR) 10 MEQ tablet Take 1 tablet (10 mEq total) by mouth 2 (two) times daily. (Patient taking differently: Take 10 mEq by mouth 3 (three) times daily. ) 60 tablet 4  . acetaminophen (TYLENOL) 500 MG tablet Take 1,000 mg by mouth every 6 (six) hours as needed for mild pain or moderate pain.    Marland Kitchen lidocaine-prilocaine (EMLA) cream Apply 1 application topically once. Apply to port 1-2 hours prior to access. (Patient not taking: Reported on 11/21/2014) 30 g 1  . LORazepam (ATIVAN) 2 MG tablet Take 0.5 tablets (1 mg total) by mouth as directed. Take 1/2 tablet (1 mg) 30 minutes prior to MRI scan.  May take an additional 1/2 tablet (69m) at time of study if needed. (Patient not taking: Reported on 03/08/2015) 1 tablet 0   No current facility-administered medications for this visit.   Facility-Administered Medications Ordered in Other Visits  Medication Dose Route Frequency Provider Last Rate Last Dose  . sodium chloride 0.9 % injection 10 mL  10 mL Intravenous PRN HLaurie Panda NP   10 mL at 03/08/15 1339  OBJECTIVE: Middle-aged [place] American woman in no acute distress Filed Vitals:   03/08/15 1357  BP: 145/74  Pulse: 89  Temp: 98 F (36.7 C)  Resp: 18     Body mass index is 40.54 kg/(m^2).    ECOG FS:1 - Symptomatic but completely ambulatory  Skin: warm, dry  HEENT: sclerae anicteric, conjunctivae pink, oropharynx clear. No thrush or mucositis.  Lymph Nodes: No cervical or supraclavicular lymphadenopathy  Lungs: clear to auscultation bilaterally, no rales, wheezes, or rhonci  Heart: regular rate and rhythm  Abdomen: round, soft, non tender, positive bowel sounds  Musculoskeletal: No focal spinal tenderness, no peripheral edema  Neuro: non focal, well oriented, positive affect  Breast: left breast status post lumpectomy and radiation. Scar tissue along incision line. Residual hyperpigmentation from radiation. Otherwise no evidence  of recurrent diease. Left axilla benign. Right breast unremarkable.  LAB RESULTS:  No results found for: SPEP  Lab Results  Component Value Date   WBC 3.7* 03/08/2015   NEUTROABS 2.0 03/08/2015   HGB 12.3 03/08/2015   HCT 37.0 03/08/2015   MCV 90.0 03/08/2015   PLT 220 03/08/2015      Chemistry      Component Value Date/Time   NA 141 03/08/2015 1319   NA 140 09/19/2014 1325   K 4.0 03/08/2015 1319   K 3.7 09/19/2014 1325   CL 107 09/19/2014 1325   CO2 25 03/08/2015 1319   CO2 24 09/19/2014 1325   BUN 11.7 03/08/2015 1319   BUN <5* 09/19/2014 1325   CREATININE 0.9 03/08/2015 1319   CREATININE 0.81 09/19/2014 1325      Component Value Date/Time   CALCIUM 9.8 03/08/2015 1319   CALCIUM 9.7 09/19/2014 1325   ALKPHOS 238* 03/08/2015 1319   ALKPHOS 121* 04/24/2014 0958   AST 36* 03/08/2015 1319   AST 13 04/24/2014 0958   ALT 52 03/08/2015 1319   ALT 25 04/24/2014 0958   BILITOT 1.02 03/08/2015 1319   BILITOT 0.3 04/24/2014 0958       No results found for: LABCA2  No components found for: [id number]  No results for input(s): INR in the last 168 hours.  Urinalysis    Component Value Date/Time   COLORURINE YELLOW 04/05/2008 1120   APPEARANCEUR CLEAR 04/05/2008 1120   LABSPEC 1.021 04/05/2008 1120   PHURINE 6.0 04/05/2008 1120   GLUCOSEU NEGATIVE 04/05/2008 1120   HGBUR NEGATIVE 04/05/2008 1120   BILIRUBINUR NEGATIVE 04/05/2008 1120   KETONESUR NEGATIVE 04/05/2008 1120   PROTEINUR NEGATIVE 04/05/2008 1120   UROBILINOGEN 0.2 04/05/2008 1120   NITRITE NEGATIVE 04/05/2008 1120   LEUKOCYTESUR  04/05/2008 1120    NEGATIVE MICROSCOPIC NOT DONE ON URINES WITH NEGATIVE PROTEIN, BLOOD, LEUKOCYTES, NITRITE, OR GLUCOSE <1000 mg/dL.    STUDIES: Mr Liver W Wo Contrast  03-08-2015   CLINICAL DATA:  Elevated liver function studies. Triple negative breast cancer diagnosed earlier this year. Evaluate for possible metastatic disease. Initial encounter.  EXAM: MRI ABDOMEN  WITHOUT AND WITH CONTRAST  TECHNIQUE: Multiplanar multisequence MR imaging of the abdomen was performed both before and after the administration of intravenous contrast.  CONTRAST:  24m MULTIHANCE GADOBENATE DIMEGLUMINE 529 MG/ML IV SOLN  COMPARISON:  Chest CT and PET CT 03/06/2014.  FINDINGS: Examination is mildly motion degraded. Patient developed nausea and vomiting and medially after contrast infusion. The initial LAVA sequence is motion degraded. It was repeated twice.  Lower chest:  Unremarkable.  Hepatobiliary: The liver demonstrates no focal lesion or abnormal enhancement. The hepatic vascular  structures enhance normally. No evidence of gallstones, gallbladder wall thickening or biliary dilatation.  Pancreas: Unremarkable. No pancreatic ductal dilatation or surrounding inflammatory changes.  Spleen: Normal in size without focal abnormality.  Adrenals/Urinary Tract: Both adrenal glands appear normal.Probable tiny cyst in the interpolar region of the left kidney. The right kidney appears normal. No hydronephrosis.  Stomach/Bowel: No evidence of bowel wall thickening, distention or surrounding inflammatory change.  Vascular/Lymphatic: There are no enlarged abdominal lymph nodes. No significant vascular findings are present.  Other: None.  Musculoskeletal: No acute or significant osseous findings.  IMPRESSION: 1. No evidence of metastatic disease within the liver or elsewhere in the visualized abdomen. 2. No evidence of biliary or hepatic parenchymal disease.   Electronically Signed   By: Richardean Sale M.D.   On: 03/01/2015 13:32    ASSESSMENT: 64 y.o. BRCA negative [place] woman s/p breast and left axillary lymph node biopsy 02/22/2014, both positive for a clinical T2 N2, stage IIIA invasive ductal carcinoma, grade 3, triple negative, with an MIB-1 of 90%  (1) neoadjuvant chemotherapy started 03/13/2014, with cyclophosphamide and doxorubicin in dose dense fashion x4, with Neulasta support on day 2,  completed 04/24/2014, to be followed by weekly carboplatin and paclitaxel x12, paclitaxel reduced during cycle 5 and cycle 7 because of elevated bilirubin levels  (2) d left lumpectomy and sentinel lymph node sampling 09/20/2014 showed a complete pathologic response.  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  (b) PALB2 mutations are known to increase the risk of breast and pancreatic cancer, possibly other cancers, but the data is preliminary  (6) staging studies showed right-sided lung nodules, too small to characterize, and a dominant right-sided thyroid nodule unchanged in size as compared to 2011; to be followed  PLAN: I reviewed the results of her liver MRI, and it was entirely negative. The labs were reviewed in detail and while her bilirubin and ALT have normalized, her AST and alkaline phosphatase are still elevated, but improved. The patient denies tylenol, statin, or alcohol use. She is asymptomatic at this time.   Otherwise, the patient is doing well and the rest of her labs were normal. I have placed orders for her port to finally be removed.  She will return in 3 months for labs and a follow up visit with Dr. Jana Hakim. She understands and agrees with this plan. She has been encouraged to call with any issues that might arise before her next visit here.   Laurie Panda, NP   03/08/2015 2:31 PM

## 2015-03-08 NOTE — Patient Instructions (Signed)

## 2015-03-11 ENCOUNTER — Other Ambulatory Visit: Payer: Self-pay | Admitting: Radiology

## 2015-03-12 ENCOUNTER — Other Ambulatory Visit: Payer: Self-pay | Admitting: Radiology

## 2015-03-13 ENCOUNTER — Encounter (HOSPITAL_COMMUNITY): Payer: Self-pay

## 2015-03-13 ENCOUNTER — Ambulatory Visit (HOSPITAL_COMMUNITY)
Admission: RE | Admit: 2015-03-13 | Discharge: 2015-03-13 | Disposition: A | Discharge status: Home / Self Care | Payer: Medicare Other | Source: Ambulatory Visit | Attending: Nurse Practitioner | Admitting: Nurse Practitioner

## 2015-03-13 DIAGNOSIS — Z452 Encounter for adjustment and management of vascular access device: Secondary | ICD-10-CM | POA: Insufficient documentation

## 2015-03-13 DIAGNOSIS — Z853 Personal history of malignant neoplasm of breast: Secondary | ICD-10-CM | POA: Diagnosis not present

## 2015-03-13 DIAGNOSIS — Z85038 Personal history of other malignant neoplasm of large intestine: Secondary | ICD-10-CM | POA: Insufficient documentation

## 2015-03-13 DIAGNOSIS — C50412 Malignant neoplasm of upper-outer quadrant of left female breast: Secondary | ICD-10-CM

## 2015-03-13 LAB — BASIC METABOLIC PANEL
Anion gap: 8 (ref 5–15)
BUN: 12 mg/dL (ref 6–20)
CHLORIDE: 107 mmol/L (ref 101–111)
CO2: 25 mmol/L (ref 22–32)
Calcium: 9.5 mg/dL (ref 8.9–10.3)
Creatinine, Ser: 0.91 mg/dL (ref 0.44–1.00)
GFR calc non Af Amer: >60 mL/min (ref >60)
GLUCOSE: 102 mg/dL — AB (ref 65–99)
POTASSIUM: 4.1 mmol/L (ref 3.5–5.1)
SODIUM: 140 mmol/L (ref 135–145)

## 2015-03-13 LAB — CBC
HCT: 36.2 % (ref 36.0–46.0)
HEMOGLOBIN: 12 g/dL (ref 12.0–15.0)
MCH: 29.9 pg (ref 26.0–34.0)
MCHC: 33.1 g/dL (ref 30.0–36.0)
MCV: 90.3 fL (ref 78.0–100.0)
Platelets: 226 10*3/uL (ref 150–400)
RBC: 4.01 MIL/uL (ref 3.87–5.11)
RDW: 13.5 % (ref 11.5–15.5)
WBC: 3.2 10*3/uL — AB (ref 4.0–10.5)

## 2015-03-13 LAB — APTT: APTT: 32 s (ref 24–37)

## 2015-03-13 LAB — PROTIME-INR
INR: 1.07 (ref 0.00–1.49)
PROTHROMBIN TIME: 14.1 s (ref 11.6–15.2)

## 2015-03-13 MED ORDER — FENTANYL CITRATE (PF) 100 MCG/2ML IJ SOLN
INTRAMUSCULAR | Status: AC
  Filled 2015-03-13: qty 2

## 2015-03-13 MED ORDER — HYDROCODONE-ACETAMINOPHEN 5-325 MG PO TABS: 1.0000 | ORAL_TABLET | ORAL | Status: DC | PRN

## 2015-03-13 MED ORDER — LIDOCAINE HCL 1 % IJ SOLN
INTRAMUSCULAR | Status: AC
  Filled 2015-03-13: qty 20

## 2015-03-13 MED ORDER — CEFAZOLIN SODIUM-DEXTROSE 2-3 GM-% IV SOLR
INTRAVENOUS | Status: AC
  Filled 2015-03-13: qty 50

## 2015-03-13 MED ORDER — CEFAZOLIN SODIUM-DEXTROSE 2-3 GM-% IV SOLR
2.0000 g | Freq: Once | INTRAVENOUS | Status: AC
  Administered 2015-03-13: 2 g via INTRAVENOUS

## 2015-03-13 MED ORDER — FENTANYL CITRATE (PF) 100 MCG/2ML IJ SOLN
INTRAMUSCULAR | Status: AC | PRN
  Administered 2015-03-13: 50 ug via INTRAVENOUS

## 2015-03-13 MED ORDER — SODIUM CHLORIDE 0.9 % IV SOLN
Freq: Once | INTRAVENOUS | Status: AC
  Administered 2015-03-13: 08:00:00 via INTRAVENOUS

## 2015-03-13 MED ORDER — MIDAZOLAM HCL 2 MG/2ML IJ SOLN
INTRAMUSCULAR | Status: AC | PRN
  Administered 2015-03-13: 0.5 mg via INTRAVENOUS
  Administered 2015-03-13: 1 mg via INTRAVENOUS

## 2015-03-13 MED ORDER — MIDAZOLAM HCL 2 MG/2ML IJ SOLN
INTRAMUSCULAR | Status: AC
  Filled 2015-03-13: qty 4

## 2015-03-13 NOTE — Procedures (Signed)
Successful removal of right chest port.  No immediate complication.  Minimal blood loss. 

## 2015-03-13 NOTE — H&P (Signed)
Chief Complaint: "I'm getting my port out"  Referring Physician(s): Boelter,Heather F  History of Present Illness: [name] is a 64 y.o. female with history of left breast cancer and prior lumpectomy who has completed chemoradiation. She presents today for port a cath removal.   Past Medical History  Diagnosis Date  . Hx of rotator cuff surgery   . Colon cancer   . left breast cancer   . Radiation 11/15/14-01/02/15    left breast, axillary and supraclavicular region 45 gray, lumpectomy cavity boosted to 16 gray    Past Surgical History  Procedure Laterality Date  . Abdominal hysterectomy  1988  . Colon surgery  1988    colectomy/colostomy-ca  . Colon surgery  1988    colostomy takedown-reversal  . Colonoscopy    . Total shoulder arthroplasty  2009    right  . Radioactive seed guided mastectomy with axillary sentinel lymph node biopsy Left 09/20/2014    Procedure: RADIOACTIVE SEED GUIDED LEFT BREAST LUMPECTOMY WITH AXILLARY SENTINEL LYMPH NODE BIOPSY;  Surgeon: Rolm Bookbinder, MD;  Location: [facility];  Service: General;  Laterality: Left;    Allergies: Review of patient's allergies indicates no known allergies.  Medications: Prior to Admission medications   Medication Sig Start Date End Date Taking? Authorizing Provider  acetaminophen (TYLENOL) 500 MG tablet Take 1,000 mg by mouth every 6 (six) hours as needed for mild pain or moderate pain.    Historical Provider, MD  gabapentin (NEURONTIN) 300 MG capsule Take 1 capsule (300 mg total) by mouth at bedtime. 10/27/14   Chauncey Cruel, MD  lidocaine-prilocaine (EMLA) cream Apply 1 application topically once. Apply to port 1-2 hours prior to access. Patient not taking: Reported on 11/21/2014 03/10/14   Gordy Levan, MD  LORazepam (ATIVAN) 2 MG tablet Take 0.5 tablets (1 mg total) by mouth as directed. Take 1/2 tablet (1 mg) 30 minutes prior to MRI scan.  May take an additional 1/2 tablet (1mg ) at  time of study if needed. Patient not taking: Reported on 03/08/2015 02/15/15   Laurie Panda, NP  loteprednol (LOTEMAX) 0.5 % ophthalmic suspension Place 1 drop into both eyes 3 (three) times daily.    Historical Provider, MD  potassium chloride (K-DUR) 10 MEQ tablet Take 1 tablet (10 mEq total) by mouth 2 (two) times daily. Patient taking differently: Take 10 mEq by mouth 3 (three) times daily.  05/16/14   Chauncey Cruel, MD     Family History  Problem Relation Age of Onset  . Cancer Mother 75    breast  . Cancer Maternal Aunt     breast cancer at unknown age    History   Social History  . Marital Status: Divorced    Spouse Name: N/A  . Number of Children: 2  . Years of Education: N/A   Social History Main Topics  . Smoking status: Never Smoker   . Smokeless tobacco: Not on file  . Alcohol Use: Yes     Comment: occ-rare  . Drug Use: No  . Sexual Activity: Not on file   Other Topics Concern  . None   Social History Narrative      Review of Systems  Constitutional: Negative for fever and chills.  Respiratory: Negative for cough.        Some dyspnea with exertion  Cardiovascular: Negative for chest pain.  Gastrointestinal: Negative for nausea, vomiting, abdominal pain and blood in stool.  Genitourinary: Negative for  dysuria and hematuria.  Musculoskeletal: Negative for back pain.  Neurological: Negative for headaches.    Vital Signs: BP 135/86 mmHg  Pulse 88  Temp(Src) 97.9 F (36.6 C) (Oral)  Resp 18  Ht 5\' 2"  (1.575 m)  Wt 221 lb 11.2 oz (100.562 kg)  BMI 40.54 kg/m2  SpO2 100%  Physical Exam  Constitutional: She is oriented to person, place, and time. She appears well-developed and well-nourished.  Cardiovascular: Normal rate and regular rhythm.   Clean, intact rt chest wall PAC  Pulmonary/Chest: Effort normal and breath sounds normal.  Abdominal: Soft. Bowel sounds are normal.  obese  Musculoskeletal: Normal range of motion.  Neurological: She  is alert and oriented to person, place, and time.    Mallampati Score:     Imaging: Mr Liver W Wo Contrast  03/01/2015   CLINICAL DATA:  Elevated liver function studies. Triple negative breast cancer diagnosed earlier this year. Evaluate for possible metastatic disease. Initial encounter.  EXAM: MRI ABDOMEN WITHOUT AND WITH CONTRAST  TECHNIQUE: Multiplanar multisequence MR imaging of the abdomen was performed both before and after the administration of intravenous contrast.  CONTRAST:  79mL MULTIHANCE GADOBENATE DIMEGLUMINE 529 MG/ML IV SOLN  COMPARISON:  Chest CT and PET CT 03/06/2014.  FINDINGS: Examination is mildly motion degraded. Patient developed nausea and vomiting and medially after contrast infusion. The initial LAVA sequence is motion degraded. It was repeated twice.  Lower chest:  Unremarkable.  Hepatobiliary: The liver demonstrates no focal lesion or abnormal enhancement. The hepatic vascular structures enhance normally. No evidence of gallstones, gallbladder wall thickening or biliary dilatation.  Pancreas: Unremarkable. No pancreatic ductal dilatation or surrounding inflammatory changes.  Spleen: Normal in size without focal abnormality.  Adrenals/Urinary Tract: Both adrenal glands appear normal.Probable tiny cyst in the interpolar region of the left kidney. The right kidney appears normal. No hydronephrosis.  Stomach/Bowel: No evidence of bowel wall thickening, distention or surrounding inflammatory change.  Vascular/Lymphatic: There are no enlarged abdominal lymph nodes. No significant vascular findings are present.  Other: None.  Musculoskeletal: No acute or significant osseous findings.  IMPRESSION: 1. No evidence of metastatic disease within the liver or elsewhere in the visualized abdomen. 2. No evidence of biliary or hepatic parenchymal disease.   Electronically Signed   By: Richardean Sale M.D.   On: 03/01/2015 13:32    Labs:  CBC:  Recent Labs  12/01/14 1155 01/31/15 1110  03/08/15 1319 03/13/15 0810  WBC 3.7* 2.9* 3.7* 3.2*  HGB 11.3* 12.0 12.3 12.0  HCT 34.8 36.5 37.0 36.2  PLT 230 233 220 226    COAGS:  Recent Labs  03/13/15 0810  INR 1.07  APTT 32    BMP:  Recent Labs  04/24/14 0958  09/19/14 1325 10/27/14 1134 12/01/14 1155 01/31/15 1110 03/08/15 1319  NA 143  < > 140 142 141 140 141  K 3.5  < > 3.7 3.7 3.7 3.9 4.0  CL 105  --  107  --   --   --   --   CO2 25  < > 24 23 23 24 25   GLUCOSE 95  < > 93 96 100 127 101  BUN 12  < > <5* 10.8 10.1 9.7 11.7  CALCIUM 9.3  < > 9.7 9.6 9.1 9.6 9.8  CREATININE 0.93  < > 0.81 0.9 0.8 0.8 0.9  GFRNONAA  --   --  41*  --   --   --   --   GFRAA  --   --  88*  --   --   --   --   < > = values in this interval not displayed.  LIVER FUNCTION TESTS:  Recent Labs  10/27/14 1134 12/01/14 1155 01/31/15 1110 03/08/15 1319  BILITOT 1.28* 0.75 1.32* 1.02  AST 19 15 58* 36*  ALT 21 14 98* 52  ALKPHOS 133 123 243* 238*  PROT 6.8 6.6 7.2 7.5  ALBUMIN 3.5 3.3* 3.3* 3.4*    TUMOR MARKERS: No results for input(s): AFPTM, CEA, CA199, CHROMGRNA in the last 8760 hours.  Assessment and Plan: MICHI HERRMANN is a 64 y.o. female with history of left breast cancer and prior lumpectomy who has completed chemoradiation. She presents today for port a cath removal. Details/risks of procedure, including but not limited to internal bleeding/vascular injury, infection, d/w pt with her understanding and consent.     Signed: D. Rowe Robert 03/13/2015, 8:35 AM   I spent a total of 20 minutes in face to face in clinical consultation, greater than 50% of which was counseling/coordinating care for port a cath removal

## 2015-03-13 NOTE — Discharge Instructions (Signed)
Incision Care °An incision is when a surgeon cuts into your body tissues. After surgery, the incision needs to be cared for properly to prevent infection.  °HOME CARE INSTRUCTIONS  °· Take all medicine as directed by your caregiver. Only take over-the-counter or prescription medicines for pain, discomfort, or fever as directed by your caregiver. °· Do not remove your bandage (dressing) or get your incision wet until your surgeon gives you permission. In the event that your dressing becomes wet, dirty, or starts to smell, change the dressing and call your surgeon for instructions as soon as possible. °· Take showers. Do not take tub baths, swim, or do anything that may soak the wound until it is healed. °· Resume your normal diet and activities as directed or allowed. °· Avoid lifting any weight until you are instructed otherwise. °· Use anti-itch antihistamine medicine as directed by your caregiver. The wound may itch when it is healing. Do not pick or scratch at the wound. °· Follow up with your caregiver for stitch (suture) or staple removal as directed. °· Drink enough fluids to keep your urine clear or pale yellow. °SEEK MEDICAL CARE IF:  °· You have redness, swelling, or increasing pain in the wound that is not controlled with medicine. °· You have drainage, blood, or pus coming from the wound that lasts longer than 1 day. °· You develop muscle aches, chills, or a general ill feeling. °· You notice a bad smell coming from the wound or dressing. °· Your wound edges separate after the sutures, staples, or skin adhesive strips have been removed. °· You develop persistent nausea or vomiting. °SEEK IMMEDIATE MEDICAL CARE IF:  °· You have a fever. °· You develop a rash. °· You develop dizzy episodes or faint while standing. °· You have difficulty breathing. °· You develop any reaction or side effects to medicine given. °MAKE SURE YOU:  °· Understand these instructions. °· Will watch your condition. °· Will get help  right away if you are not doing well or get worse. °Document Released: 03/21/2005 Document Revised: 11/24/2011 Document Reviewed: 10/26/2013 °ExitCare® Patient Information ©2015 [employer]. This information is not intended to replace advice given to you by your health care provider. Make sure you discuss any questions you have with your health care provider. °Conscious Sedation °Sedation is the use of medicines to promote relaxation and relieve discomfort and anxiety. Conscious sedation is a type of sedation. Under conscious sedation you are less alert than normal but are still able to respond to instructions or stimulation. Conscious sedation is used during short medical and dental procedures. It is milder than deep sedation or general anesthesia and allows you to return to your regular activities sooner.  °LET YOUR HEALTH CARE PROVIDER KNOW ABOUT:  °· Any allergies you have. °· All medicines you are taking, including vitamins, herbs, eye drops, creams, and over-the-counter medicines. °· Use of steroids (by mouth or creams). °· Previous problems you or members of your family have had with the use of anesthetics. °· Any blood disorders you have. °· Previous surgeries you have had. °· Medical conditions you have. °· Possibility of pregnancy, if this applies. °· Use of cigarettes, alcohol, or illegal drugs. °RISKS AND COMPLICATIONS °Generally, this is a safe procedure. However, as with any procedure, problems can occur. Possible problems include: °· Oversedation. °· Trouble breathing on your own. You may need to have a breathing tube until you are awake and breathing on your own. °· Allergic reaction to any of   the medicines used for the procedure. BEFORE THE PROCEDURE  You may have blood tests done. These tests can help show how well your kidneys and liver are working. They can also show how well your blood clots.  A physical exam will be done.  Only take medicines as directed by your health care provider.  You may need to stop taking medicines (such as blood thinners, aspirin, or nonsteroidal anti-inflammatory drugs) before the procedure.   Do not eat or drink at least 6 hours before the procedure or as directed by your health care provider.  Arrange for a responsible adult, family member, or friend to take you home after the procedure. He or she should stay with you for at least 24 hours after the procedure, until the medicine has worn off. PROCEDURE   An intravenous (IV) catheter will be inserted into one of your veins. Medicine will be able to flow directly into your body through this catheter. You may be given medicine through this tube to help prevent pain and help you relax.  The medical or dental procedure will be done. AFTER THE PROCEDURE  You will stay in a recovery area until the medicine has worn off. Your blood pressure and pulse will be checked.   Depending on the procedure you had, you may be allowed to go home when you can tolerate liquids and your pain is under control. Document Released: 05/27/2001 Document Revised: 09/06/2013 Document Reviewed: 05/09/2013 [facility] Patient Information 2015 [place], [place]. This information is not intended to replace advice given to you by your health care provider. Make sure you discuss any questions you have with your health care provider. Conscious Sedation, Adult, Care After Refer to this sheet in the next few weeks. These instructions provide you with information on caring for yourself after your procedure. Your health care provider may also give you more specific instructions. Your treatment has been planned according to current medical practices, but problems sometimes occur. Call your health care provider if you have any problems or questions after your procedure. WHAT TO EXPECT AFTER THE PROCEDURE  After your procedure:  You may feel sleepy, clumsy, and have poor balance for several hours.  Vomiting may occur if you eat too soon  after the procedure. HOME CARE INSTRUCTIONS  Do not participate in any activities where you could become injured for at least 24 hours. Do not:  Drive.  Swim.  Ride a bicycle.  Operate heavy machinery.  Cook.  Use power tools.  Climb ladders.  Work from a high place.  Do not make important decisions or sign legal documents until you are improved.  If you vomit, drink water, juice, or soup when you can drink without vomiting. Make sure you have little or no nausea before eating solid foods.  Only take over-the-counter or prescription medicines for pain, discomfort, or fever as directed by your health care provider.  Make sure you and your family fully understand everything about the medicines given to you, including what side effects may occur.  You should not drink alcohol, take sleeping pills, or take medicines that cause drowsiness for at least 24 hours.  If you smoke, do not smoke without supervision.  If you are feeling better, you may resume normal activities 24 hours after you were sedated.  Keep all appointments with your health care provider. SEEK MEDICAL CARE IF:  Your skin is pale or bluish in color.  You continue to feel nauseous or vomit.  Your pain is getting worse  and is not helped by medicine.  You have bleeding or swelling.  You are still sleepy or feeling clumsy after 24 hours. SEEK IMMEDIATE MEDICAL CARE IF:  You develop a rash.  You have difficulty breathing.  You develop any type of allergic problem.  You have a fever. MAKE SURE YOU:  Understand these instructions.  Will watch your condition.  Will get help right away if you are not doing well or get worse. Document Released: 06/22/2013 Document Reviewed: 06/22/2013 [facility] Patient Information 2015 [place], [place]. This information is not intended to replace advice given to you by your health care provider. Make sure you discuss any questions you have with your health care  provider.

## 2015-05-24 ENCOUNTER — Encounter: Payer: Self-pay | Admitting: Genetic Counselor

## 2015-05-24 DIAGNOSIS — Z1379 Encounter for other screening for genetic and chromosomal anomalies: Secondary | ICD-10-CM | POA: Insufficient documentation

## 2015-06-07 ENCOUNTER — Other Ambulatory Visit (HOSPITAL_BASED_OUTPATIENT_CLINIC_OR_DEPARTMENT_OTHER): Payer: Medicare Other

## 2015-06-07 ENCOUNTER — Telehealth: Payer: Self-pay | Admitting: Nurse Practitioner

## 2015-06-07 ENCOUNTER — Other Ambulatory Visit: Payer: Medicare Other

## 2015-06-07 ENCOUNTER — Encounter: Payer: Self-pay | Admitting: Nurse Practitioner

## 2015-06-07 ENCOUNTER — Ambulatory Visit (HOSPITAL_BASED_OUTPATIENT_CLINIC_OR_DEPARTMENT_OTHER): Payer: Medicare Other | Admitting: Nurse Practitioner

## 2015-06-07 VITALS — BP 128/85 | HR 90 | Temp 98.6°F | Resp 20 | Ht 62.0 in | Wt 227.1 lb

## 2015-06-07 DIAGNOSIS — C50412 Malignant neoplasm of upper-outer quadrant of left female breast: Secondary | ICD-10-CM

## 2015-06-07 DIAGNOSIS — R911 Solitary pulmonary nodule: Secondary | ICD-10-CM

## 2015-06-07 LAB — COMPREHENSIVE METABOLIC PANEL (CC13)
ALBUMIN: 3.3 g/dL — AB (ref 3.5–5.0)
ALK PHOS: 139 U/L (ref 40–150)
ALT: 13 U/L (ref 0–55)
AST: 12 U/L (ref 5–34)
Anion Gap: 6 mEq/L (ref 3–11)
BUN: 8.8 mg/dL (ref 7.0–26.0)
CALCIUM: 9.6 mg/dL (ref 8.4–10.4)
CO2: 26 mEq/L (ref 22–29)
Chloride: 109 mEq/L (ref 98–109)
Creatinine: 0.8 mg/dL (ref 0.6–1.1)
EGFR: 86 mL/min/{1.73_m2} — AB (ref >90)
Glucose: 94 mg/dl (ref 70–140)
POTASSIUM: 3.8 meq/L (ref 3.5–5.1)
Sodium: 141 mEq/L (ref 136–145)
Total Bilirubin: 1.19 mg/dL (ref 0.20–1.20)
Total Protein: 7 g/dL (ref 6.4–8.3)

## 2015-06-07 LAB — CBC WITH DIFFERENTIAL/PLATELET
BASO%: 0.5 % (ref 0.0–2.0)
BASOS ABS: 0 10*3/uL (ref 0.0–0.1)
EOS ABS: 0.1 10*3/uL (ref 0.0–0.5)
EOS%: 1.9 % (ref 0.0–7.0)
HCT: 36.5 % (ref 34.8–46.6)
HGB: 12.1 g/dL (ref 11.6–15.9)
LYMPH#: 1 10*3/uL (ref 0.9–3.3)
LYMPH%: 21.6 % (ref 14.0–49.7)
MCH: 30 pg (ref 25.1–34.0)
MCHC: 33.3 g/dL (ref 31.5–36.0)
MCV: 90.1 fL (ref 79.5–101.0)
MONO#: 0.4 10*3/uL (ref 0.1–0.9)
MONO%: 8.9 % (ref 0.0–14.0)
NEUT#: 3.1 10*3/uL (ref 1.5–6.5)
NEUT%: 67.1 % (ref 38.4–76.8)
Platelets: 249 10*3/uL (ref 145–400)
RBC: 4.05 10*6/uL (ref 3.70–5.45)
RDW: 13.6 % (ref 11.2–14.5)
WBC: 4.7 10*3/uL (ref 3.9–10.3)

## 2015-06-07 NOTE — Progress Notes (Signed)
[place]  Telephone:(336) [phone] Fax:(336) [phone]     ID: [name] DOB: 1951-04-01  MR#: [id number]  QTM#:[id number]  PCP: Ruth Greenland, MD GYN: SU: [name] OTHER MD: [name]  CHIEF COMPLAINT: Stage III breast cancer, triple negative   CURRENT TREATMENT: awaiting adjuvant radiation  BREAST CANCER HISTORY: From the original intake note:  [name] herself palpated a mass in her left breast, and immediately brought it to the attention of her primary care physician, Dr. Baird Cancer, who confirmed the finding and setup the patient for bilateral diagnostic mammography at the breast Center 02/20/2014. This showed a high density mass in the outer left breast measuring up to 3.7 cm. It corresponded to the site of palpable concern. In addition the normal 4 logically abnormal lymph nodes in the left axilla. The mass was palpable by exam, and by ultrasonography measured 2.6 cm. There were enlarged and morphologically abnormal lymph nodes in the left axilla, largest measuring 3 cm.  Biopsy of the left breast mass in question and 1 of the abnormal axillary lymph nodes 02/22/2014 showed ([id number]) biopsies to be positive for an invasive ductal carcinoma, grade 3, triple negative, with an MIB-1 of 90%. Specifically the HER-2 signals ratio was 1.05, and the number per cell 2.00.  MRI of the breasts 03/01/2014 showed a 3.8 cm enhancing mass with areas of apparent necrosis in the left breast, as well as the biopsy tract extending laterally, the combined measure 5.3 cm. There were no other masses or areas of abnormal enhancement. There were multiple abnormally enlarged left axillary lymph nodes with loss of the normal fatty hilum. These included level I and retropectoral lymph nodes.  The patient's subsequent history is as detailed below   INTERVAL HISTORY: [name] returns today for follow up of her breast cancer.  She is doing well since her last visit here and has  returned to work. 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 Unfortunately this woman is not always kind in her dementia. She is also up frequently during the night, ambulating about the house, so [name] gets very little sleep on the nights that she is on duty.  REVIEW OF SYSTEMS: [name] denies fevers, chills, nausea, vomiting, or change sin bowel or bladder habits. 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 She denies headaches, dizziness, weakness, or vision changes. She has shortness of breath with exertion, but denies cough, palpitations, or chest pain. She has been active at the gym weekly. A detailed review of systems is otherwise stable.  PAST MEDICAL HISTORY: Past Medical History  Diagnosis Date  . Hx of rotator cuff surgery   . Colon cancer   . left breast cancer   . Radiation 11/15/14-01/02/15    left breast, axillary and supraclavicular region 45 gray, lumpectomy cavity boosted to 16 gray    PAST SURGICAL HISTORY: Past Surgical History  Procedure Laterality Date  . Abdominal hysterectomy  1988  . Colon surgery  1988    colectomy/colostomy-ca  . Colon surgery  1988    colostomy takedown-reversal  . Colonoscopy    . Total shoulder arthroplasty  2009    right  . Radioactive seed guided mastectomy with axillary sentinel lymph node biopsy Left 09/20/2014    Procedure: RADIOACTIVE SEED GUIDED LEFT BREAST LUMPECTOMY WITH AXILLARY SENTINEL LYMPH NODE BIOPSY;  Surgeon: Ruth Bookbinder, MD;  Location: [place] [place];  Service: General;  Laterality: Left;    FAMILY HISTORY  Family History  Problem Relation Age of Onset  . Cancer Mother 6    breast  . Cancer Maternal Aunt     breast cancer at unknown age   the patient's father died at the age of 53 following a stroke in the setting of diabetes; the patient's mother is living at 79. The patient has 3 brothers and 2 sisters. [name], [name], was  diagnosed with breast cancer in her early 34s. There is no other breast or ovarian cancer in the family. The patient herself was diagnosed with watts by history he is an incidentally found stage I colon carcinoma noted at the time of her hysterectomy. She had a partial colectomy but no adjuvant treatment. We have no records regarding that procedure  GYNECOLOGIC HISTORY:  No LMP recorded. Patient has had a hysterectomy. Menarche age 30, first live birth age 79, the patient underwent total abdominal hysterectomy with bilateral salpingo-oophorectomy in her 53s. She did not take hormone replacement. She did not take oral contraceptives at any point.  SOCIAL HISTORY:  [employer] work for [employer] for about 40 years, retiring in 2008. She is divorced and lives alone, with no pets. Her daughter [name] works for [employer] in [employer]. The second daughter, [name], works as a Research scientist (physical sciences) for [employer]. The patient has 6 grandchildren and one great-grandchild. She attends a [employer].    ADVANCED DIRECTIVES: Not in place. The patient intends to name her daughter [name]. [name] is work number is [id number]. On the patient's 03/01/2014 visit she was given a copy of the appropriate documents to complete and notarize at her discretion   HEALTH MAINTENANCE: Social History  Substance Use Topics  . Smoking status: Never Smoker   . Smokeless tobacco: Not on file  . Alcohol Use: Yes     Comment: occ-rare     Colonoscopy: 2000  PAP: May 2015  Bone density: 05/23/2004, at [facility] hospital; normal  Lipid panel:  No Known Allergies  Current Outpatient Prescriptions  Medication Sig Dispense Refill  . gabapentin (NEURONTIN) 300 MG capsule Take 1 capsule (300 mg total) by mouth at bedtime. 90 capsule 4  . loteprednol (LOTEMAX) 0.5 % ophthalmic suspension Place 1 drop into both eyes 3 (three) times daily. Pt use this medication as needed.    . potassium  chloride (K-DUR) 10 MEQ tablet Take 1 tablet (10 mEq total) by mouth 2 (two) times daily. (Patient taking differently: Take 10 mEq by mouth 3 (three) times daily. ) 60 tablet 4  . acetaminophen (TYLENOL) 500 MG tablet Take 1,000 mg by mouth every 6 (six) hours as needed for mild pain or moderate pain.    Marland Kitchen LORazepam (ATIVAN) 2 MG tablet Take 0.5 tablets (1 mg total) by mouth as directed. Take 1/2 tablet (1 mg) 30 minutes prior to MRI scan.  May take an additional 1/2 tablet (53m) at time of study if needed. (Patient not taking: Reported on 03/08/2015) 1 tablet 0   No current facility-administered medications for this visit.    OBJECTIVE: Middle-aged [place] woman in no acute distress Filed Vitals:   06/07/15 1328  BP: 128/85  Pulse: 90  Temp: 98.6 F (37 C)  Resp: 20     Body mass index is 41.53 kg/(m^2).    ECOG FS:1 - Symptomatic but completely ambulatory  Skin: warm, dry  HEENT: sclerae anicteric, conjunctivae pink, oropharynx clear. No thrush or mucositis.  Lymph Nodes: No cervical or  supraclavicular lymphadenopathy  Lungs: clear to auscultation bilaterally, no rales, wheezes, or rhonci  Heart: regular rate and rhythm  Abdomen: round, soft, non tender, positive bowel sounds  Musculoskeletal: No focal spinal tenderness, no peripheral edema  Neuro: non focal, well oriented, positive affect  Breast: left breast status post lumpectomy and radiation. Scar tissue along incision line. Residual hyperpigmentation from radiation. Otherwise no evidence of recurrent diease. Left axilla benign. Right breast unremarkable.  LAB RESULTS:  No results found for: SPEP  Lab Results  Component Value Date   WBC 4.7 06/07/2015   NEUTROABS 3.1 06/07/2015   HGB 12.1 06/07/2015   HCT 36.5 06/07/2015   MCV 90.1 06/07/2015   PLT 249 06/07/2015      Chemistry      Component Value Date/Time   NA 141 06/07/2015 1313   NA 140 03/13/2015 0810   K 3.8 06/07/2015 1313   K 4.1 03/13/2015 0810     CL 107 03/13/2015 0810   CO2 26 06/07/2015 1313   CO2 25 03/13/2015 0810   BUN 8.8 06/07/2015 1313   BUN 12 03/13/2015 0810   CREATININE 0.8 06/07/2015 1313   CREATININE 0.91 03/13/2015 0810      Component Value Date/Time   CALCIUM 9.6 06/07/2015 1313   CALCIUM 9.5 03/13/2015 0810   ALKPHOS 139 06/07/2015 1313   ALKPHOS 121* 04/24/2014 0958   AST 12 06/07/2015 1313   AST 13 04/24/2014 0958   ALT 13 06/07/2015 1313   ALT 25 04/24/2014 0958   BILITOT 1.19 06/07/2015 1313   BILITOT 0.3 04/24/2014 0958       No results found for: LABCA2  No components found for: LABCA125  No results for input(s): INR in the last 168 hours.  Urinalysis    Component Value Date/Time   COLORURINE YELLOW 04/05/2008 1120   APPEARANCEUR CLEAR 04/05/2008 1120   LABSPEC 1.021 04/05/2008 1120   PHURINE 6.0 04/05/2008 1120   GLUCOSEU NEGATIVE 04/05/2008 1120   HGBUR NEGATIVE 04/05/2008 1120   BILIRUBINUR NEGATIVE 04/05/2008 1120   KETONESUR NEGATIVE 04/05/2008 1120   PROTEINUR NEGATIVE 04/05/2008 1120   UROBILINOGEN 0.2 04/05/2008 1120   NITRITE NEGATIVE 04/05/2008 1120   LEUKOCYTESUR  04/05/2008 1120    NEGATIVE MICROSCOPIC NOT DONE ON URINES WITH NEGATIVE PROTEIN, BLOOD, LEUKOCYTES, NITRITE, OR GLUCOSE <1000 mg/dL.    STUDIES: No results found.  ASSESSMENT: 64 y.o. BRCA negative [place] woman s/p breast and left axillary lymph node biopsy 02/22/2014, both positive for a clinical T2 N2, stage IIIA invasive ductal carcinoma, grade 3, triple negative, with an MIB-1 of 90%  (1) neoadjuvant chemotherapy started 03/13/2014, with cyclophosphamide and doxorubicin in dose dense fashion x4, with Neulasta support on day 2, completed 04/24/2014, to be followed by weekly carboplatin and paclitaxel x12, paclitaxel reduced during cycle 5 and cycle 7 because of elevated bilirubin levels  (2) d left lumpectomy and sentinel lymph node sampling 09/20/2014 showed a complete pathologic response.  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  (b) PALB2 mutations are known to increase the risk of breast and pancreatic cancer, possibly other cancers, but the data is preliminary  (6) staging studies showed right-sided lung nodules, too small to characterize, and a dominant right-sided thyroid  nodule unchanged in size as compared to 2011; to be followed  PLAN: Karlyn is performing remarkably well since her last visit here. The labs were reviewed in detail and were stable. All of her liver function tests were back to normal.   She is due for a final CT of the chest to follow up on lung nodules and a dominant right sided thyroid nodule that have been stable since 2011. I have placed order for that to be performed in the next coming weeks.  [name] is due for a repeat mammogram in January. She will return for a follow up visit shortly afterwards.  She understands and agrees with this plan. She has been encouraged to call with any issues that might arise before her next visit here.   Ruth Panda, NP   06/07/2015 3:15 PM

## 2015-06-07 NOTE — Telephone Encounter (Signed)
Appointments made and avs printed for patient °

## 2015-06-14 DIAGNOSIS — Z6841 Body Mass Index (BMI) 40.0 and over, adult: Secondary | ICD-10-CM | POA: Diagnosis not present

## 2015-06-14 DIAGNOSIS — Z1211 Encounter for screening for malignant neoplasm of colon: Secondary | ICD-10-CM | POA: Diagnosis not present

## 2015-06-14 DIAGNOSIS — E669 Obesity, unspecified: Secondary | ICD-10-CM | POA: Diagnosis not present

## 2015-06-14 DIAGNOSIS — Z0000 Encounter for general adult medical examination without abnormal findings: Secondary | ICD-10-CM | POA: Diagnosis not present

## 2015-06-14 DIAGNOSIS — C50911 Malignant neoplasm of unspecified site of right female breast: Secondary | ICD-10-CM | POA: Diagnosis not present

## 2015-06-22 ENCOUNTER — Ambulatory Visit (HOSPITAL_COMMUNITY)
Admission: RE | Admit: 2015-06-22 | Discharge: 2015-06-22 | Disposition: A | Discharge status: Home / Self Care | Payer: Medicare Other | Source: Ambulatory Visit | Attending: Nurse Practitioner | Admitting: Nurse Practitioner

## 2015-06-22 ENCOUNTER — Encounter (HOSPITAL_COMMUNITY): Payer: Self-pay

## 2015-06-22 DIAGNOSIS — R918 Other nonspecific abnormal finding of lung field: Secondary | ICD-10-CM | POA: Insufficient documentation

## 2015-06-22 DIAGNOSIS — E041 Nontoxic single thyroid nodule: Secondary | ICD-10-CM | POA: Diagnosis not present

## 2015-06-22 DIAGNOSIS — C50412 Malignant neoplasm of upper-outer quadrant of left female breast: Secondary | ICD-10-CM | POA: Insufficient documentation

## 2015-06-22 DIAGNOSIS — R911 Solitary pulmonary nodule: Secondary | ICD-10-CM

## 2015-06-22 MED ORDER — IOHEXOL 300 MG/ML  SOLN
75.0000 mL | Freq: Once | INTRAMUSCULAR | Status: AC | PRN
  Administered 2015-06-22: 75 mL via INTRAVENOUS

## 2015-06-28 DIAGNOSIS — K743 Primary biliary cirrhosis: Secondary | ICD-10-CM | POA: Diagnosis not present

## 2015-06-28 DIAGNOSIS — Z1211 Encounter for screening for malignant neoplasm of colon: Secondary | ICD-10-CM | POA: Diagnosis not present

## 2015-06-28 DIAGNOSIS — Z86010 Personal history of colonic polyps: Secondary | ICD-10-CM | POA: Diagnosis not present

## 2015-07-23 DIAGNOSIS — Z1212 Encounter for screening for malignant neoplasm of rectum: Secondary | ICD-10-CM | POA: Diagnosis not present

## 2015-07-23 DIAGNOSIS — Z1211 Encounter for screening for malignant neoplasm of colon: Secondary | ICD-10-CM | POA: Diagnosis not present

## 2015-08-03 DIAGNOSIS — C50912 Malignant neoplasm of unspecified site of left female breast: Secondary | ICD-10-CM | POA: Diagnosis not present

## 2015-09-20 MED FILL — GABAPENTIN 300 MG CAPSULE: 300 | 90 days supply | Qty: 90 | Fill #3

## 2015-09-25 ENCOUNTER — Ambulatory Visit: Payer: Medicare Other | Admitting: Oncology

## 2015-10-11 ENCOUNTER — Ambulatory Visit (HOSPITAL_BASED_OUTPATIENT_CLINIC_OR_DEPARTMENT_OTHER): Payer: Medicare Other | Admitting: Oncology

## 2015-10-11 ENCOUNTER — Telehealth: Payer: Self-pay | Admitting: Oncology

## 2015-10-11 VITALS — BP 142/83 | HR 88 | Temp 98.8°F | Resp 18 | Ht 62.0 in | Wt 235.6 lb

## 2015-10-11 DIAGNOSIS — Z1589 Genetic susceptibility to other disease: Secondary | ICD-10-CM

## 2015-10-11 DIAGNOSIS — Z1502 Genetic susceptibility to malignant neoplasm of ovary: Secondary | ICD-10-CM

## 2015-10-11 DIAGNOSIS — R918 Other nonspecific abnormal finding of lung field: Secondary | ICD-10-CM | POA: Diagnosis not present

## 2015-10-11 DIAGNOSIS — Z853 Personal history of malignant neoplasm of breast: Secondary | ICD-10-CM

## 2015-10-11 DIAGNOSIS — Z1509 Genetic susceptibility to other malignant neoplasm: Secondary | ICD-10-CM

## 2015-10-11 DIAGNOSIS — C50919 Malignant neoplasm of unspecified site of unspecified female breast: Secondary | ICD-10-CM

## 2015-10-11 DIAGNOSIS — C50412 Malignant neoplasm of upper-outer quadrant of left female breast: Secondary | ICD-10-CM

## 2015-10-11 NOTE — Telephone Encounter (Signed)
Appointments made and avs printed for patient,patient states that she would like to call for her mammo    [name]

## 2015-10-11 NOTE — Progress Notes (Signed)
[facility]  Telephone:(336) [phone] Fax:(336) [phone]     ID: [name] DOB: 06/20/1951  MR#: [id number]  TKZ#:[id number]  PCP: Ruth Greenland, MD GYN: SU: [name] OTHER MD: [name]  CHIEF COMPLAINT: Stage III breast Lopez, triple negative   CURRENT TREATMENT:observation  BREAST Lopez HISTORY: From the original intake note:  [name] herself palpated a mass in her left breast, and immediately brought it to the attention of her primary care physician, Dr. Baird Lopez, who confirmed the finding and setup the patient for bilateral diagnostic mammography at the breast Center 02/20/2014. This showed a high density mass in the outer left breast measuring up to 3.7 cm. It corresponded to the site of palpable concern. In addition the normal 4 logically abnormal lymph nodes in the left axilla. The mass was palpable by exam, and by ultrasonography measured 2.6 cm. There were enlarged and morphologically abnormal lymph nodes in the left axilla, largest measuring 3 cm.  Biopsy of the left breast mass in question and 1 of the abnormal axillary lymph nodes 02/22/2014 showed ([id number]) biopsies to be positive for an invasive ductal carcinoma, grade 3, triple negative, with an MIB-1 of 90%. Specifically the HER-2 signals ratio was 1.05, and the number per cell 2.00.  MRI of the breasts 03/01/2014 showed a 3.8 cm enhancing mass with areas of apparent necrosis in the left breast, as well as the biopsy tract extending laterally, the combined measure 5.3 cm. There were no other masses or areas of abnormal enhancement. There were multiple abnormally enlarged left axillary lymph nodes with loss of the normal fatty hilum. These included level I and retropectoral lymph nodes.  The patient's subsequent history is as detailed below   INTERVAL HISTORY: [name] returns today for follow up of her triple negative breast Lopez. She is doing "great". She enjoyed the holidays. 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 The situation is comfortable for her, gets her out of the house and helps her income. Family is doing fine  REVIEW OF SYSTEMS: Asked work her worse problem is [name] says it's her appetite. She is eating a lot and she is not exercising regularly. She says she is just lazy. There have been no unusual headaches, cough, phlegm production, pleurisy, or change in bowel or bladder habits. She denies rash, bleeding, or fevers. A detailed review of systems today was otherwise stable  PAST MEDICAL HISTORY: Past Medical History  Diagnosis Date  . Hx of rotator cuff surgery   . Colon Lopez ([place])   . left breast Lopez   . Radiation 11/15/14-01/02/15    left breast, axillary and supraclavicular region 45 gray, lumpectomy cavity boosted to 16 gray    PAST SURGICAL HISTORY: Past Surgical History  Procedure Laterality Date  . Abdominal hysterectomy  1988  . Colon surgery  1988    colectomy/colostomy-ca  . Colon surgery  1988    colostomy takedown-reversal  . Colonoscopy    . Total shoulder arthroplasty  2009    right  . Radioactive seed guided mastectomy with axillary sentinel lymph node biopsy Left 09/20/2014    Procedure: RADIOACTIVE SEED GUIDED LEFT BREAST LUMPECTOMY WITH AXILLARY SENTINEL LYMPH NODE BIOPSY;  Surgeon: Ruth Bookbinder, MD;  Location: [facility];  Service: General;  Laterality: Left;    FAMILY HISTORY Family History  Problem Relation Age of Onset  . Lopez Mother 73    breast  . Lopez Maternal Aunt  breast Lopez at unknown age   the patient's father died at the age of 84 following a stroke in the setting of diabetes; the patient's mother is living at 56. The patient has 3 brothers and 2 sisters. [name], [name], was diagnosed with breast Lopez in her early 78s. There is no other breast or ovarian Lopez in the family. The patient herself was diagnosed with [name] by history he is  an incidentally found stage I colon carcinoma noted at the time of her hysterectomy. She had a partial colectomy but no adjuvant treatment. We have no records regarding that procedure  GYNECOLOGIC HISTORY:  No LMP recorded. Patient has had a hysterectomy. Menarche age 57, first live birth age 27, the patient underwent total abdominal hysterectomy with bilateral salpingo-oophorectomy in her 35s. She did not take hormone replacement. She did not take oral contraceptives at any point.  SOCIAL HISTORY:  [name] work for [employer] for about 40 years, retiring in 2008. She is divorced and lives alone, with no pets. Her daughter [name] works for [employer] in [employer]. The second daughter, [name], works as a Research scientist (physical sciences) for [employer]. The patient has 6 grandchildren and one great-grandchild. She attends a [name].    ADVANCED DIRECTIVES: Not in place. The patient intends to name her daughter [name]. [name] is work number is [id number]. On the patient's 03/01/2014 visit she was given a copy of the appropriate documents to complete and notarize at her discretion   HEALTH MAINTENANCE: Social History  Substance Use Topics  . Smoking status: Never Smoker   . Smokeless tobacco: Not on file  . Alcohol Use: Yes     Comment: occ-rare     Colonoscopy: 2000  PAP: May 2015  Bone density: 05/23/2004, at [facility] hospital; normal  Lipid panel:  No Known Allergies  Current Outpatient Prescriptions  Medication Sig Dispense Refill  . acetaminophen (TYLENOL) 500 MG tablet Take 1,000 mg by mouth every 6 (six) hours as needed for mild pain or moderate pain.    Marland Kitchen gabapentin (NEURONTIN) 300 MG capsule Take 1 capsule (300 mg total) by mouth at bedtime. 90 capsule 4  . loteprednol (LOTEMAX) 0.5 % ophthalmic suspension Place 1 drop into both eyes 3 (three) times daily. Pt use this medication as needed.    . potassium chloride (K-DUR) 10 MEQ tablet Take 1  tablet (10 mEq total) by mouth 2 (two) times daily. (Patient taking differently: Take 10 mEq by mouth 3 (three) times daily. ) 60 tablet 4   No current facility-administered medications for this visit.    OBJECTIVE: Middle-aged [place] American woman who appears stated age 65 Vitals:   10/11/15 1152  BP: 142/83  Pulse: 88  Temp: 98.8 F (37.1 C)  Resp: 18     Body mass index is 43.08 kg/(m^2).    ECOG FS:0 - Asymptomatic  Sclerae unicteric, pupils round and equal Oropharynx clear and moist-- no thrush or other lesions No cervical or supraclavicular adenopathy Lungs no rales or rhonchi Heart regular rate and rhythm Abd soft, obese, nontender, positive bowel sounds MSK no focal spinal tenderness, no upper extremity lymphedema Neuro: nonfocal, well oriented, appropriate affect Breasts: the right breast is unremarkable. The left breast is status post lumpectomy and radiation. The cosmetic result is good. There is no evidence of local recurrence. The left axilla is benign.   LAB RESULTS:  No results found for: SPEP  Lab Results  Component Value Date  WBC 4.7 06/07/2015   NEUTROABS 3.1 06/07/2015   HGB 12.1 06/07/2015   HCT 36.5 06/07/2015   MCV 90.1 06/07/2015   PLT 249 06/07/2015      Chemistry      Component Value Date/Time   NA 141 06/07/2015 1313   NA 140 03/13/2015 0810   K 3.8 06/07/2015 1313   K 4.1 03/13/2015 0810   CL 107 03/13/2015 0810   CO2 26 06/07/2015 1313   CO2 25 03/13/2015 0810   BUN 8.8 06/07/2015 1313   BUN 12 03/13/2015 0810   CREATININE 0.8 06/07/2015 1313   CREATININE 0.91 03/13/2015 0810      Component Value Date/Time   CALCIUM 9.6 06/07/2015 1313   CALCIUM 9.5 03/13/2015 0810   ALKPHOS 139 06/07/2015 1313   ALKPHOS 121* 04/24/2014 0958   AST 12 06/07/2015 1313   AST 13 04/24/2014 0958   ALT 13 06/07/2015 1313   ALT 25 04/24/2014 0958   BILITOT 1.19 06/07/2015 1313   BILITOT 0.3 04/24/2014 0958       No results found for:  LABCA2  No components found for: LABCA125  No results for input(s): INR in the last 168 hours.  Urinalysis    Component Value Date/Time   COLORURINE YELLOW 04/05/2008 1120   APPEARANCEUR CLEAR 04/05/2008 1120   LABSPEC 1.021 04/05/2008 1120   PHURINE 6.0 04/05/2008 1120   GLUCOSEU NEGATIVE 04/05/2008 1120   HGBUR NEGATIVE 04/05/2008 1120   BILIRUBINUR NEGATIVE 04/05/2008 1120   KETONESUR NEGATIVE 04/05/2008 1120   PROTEINUR NEGATIVE 04/05/2008 1120   UROBILINOGEN 0.2 04/05/2008 1120   NITRITE NEGATIVE 04/05/2008 1120   LEUKOCYTESUR  04/05/2008 1120    NEGATIVE MICROSCOPIC NOT DONE ON URINES WITH NEGATIVE PROTEIN, BLOOD, LEUKOCYTES, NITRITE, OR GLUCOSE <1000 mg/dL.    STUDIES: No results found.  ASSESSMENT: 65 y.o. BRCA negative [place] woman s/p breast and left axillary lymph node biopsy 02/22/2014, both positive for a clinical T2 N2, stage IIIA invasive ductal carcinoma, grade 3, triple negative, with an MIB-1 of 90%  (1) neoadjuvant chemotherapy started 03/13/2014, with cyclophosphamide and doxorubicin in dose dense fashion x4, with Neulasta support on day 2, completed 04/24/2014, to be followed by weekly carboplatin and paclitaxel x12, paclitaxel reduced during cycle 5 and cycle 7 because of elevated bilirubin levels  (2) left lumpectomy and sentinel lymph node sampling 09/20/2014 showed a complete pathologic response.  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  (b) PALB2 mutations are known to increase the risk of breast and pancreatic Lopez, possibly other cancers, but the data is preliminary  (6) staging studies showed right-sided lung nodules, too small to  characterize, and a dominant right-sided thyroid nodule unchanged in size as compared to 2011; to be followed  PLAN: Miya is now a year out from her definitive surgery with no evidence of disease recurrence. This is very favorable  We discussed her obesity problem at length. She is interested in nutrition consult. I also gave her a copy of the [employer] pamphlet and urged her to call and participate. We also discussed bariatric surgery. At this time she is not interested in pursuing that option.  She is going to see [place] again in August. Before that visit we are  going to repeat a CT scan of the chest to make sure the small lung nodules we noted incidentally at baseline have not changed.  She knows to call for any problems that may develop before her next visit here.   Chauncey Cruel, MD   10/11/2015 11:59 AM

## 2015-10-18 ENCOUNTER — Encounter: Payer: Medicare Other | Admitting: Nutrition

## 2015-10-23 ENCOUNTER — Other Ambulatory Visit: Payer: Self-pay | Admitting: Oncology

## 2015-10-23 DIAGNOSIS — Z1231 Encounter for screening mammogram for malignant neoplasm of breast: Secondary | ICD-10-CM

## 2015-10-23 DIAGNOSIS — C50912 Malignant neoplasm of unspecified site of left female breast: Secondary | ICD-10-CM

## 2015-10-23 DIAGNOSIS — Z853 Personal history of malignant neoplasm of breast: Secondary | ICD-10-CM

## 2015-11-01 ENCOUNTER — Ambulatory Visit
Admission: RE | Admit: 2015-11-01 | Discharge: 2015-11-01 | Disposition: A | Discharge status: Home / Self Care | Payer: Medicare Other | Source: Ambulatory Visit | Attending: Nurse Practitioner | Admitting: Nurse Practitioner

## 2015-11-01 DIAGNOSIS — R928 Other abnormal and inconclusive findings on diagnostic imaging of breast: Secondary | ICD-10-CM | POA: Diagnosis not present

## 2015-11-01 DIAGNOSIS — Z853 Personal history of malignant neoplasm of breast: Secondary | ICD-10-CM

## 2015-11-23 ENCOUNTER — Ambulatory Visit: Payer: Self-pay | Admitting: Genetic Counselor

## 2015-11-23 DIAGNOSIS — Z1379 Encounter for other screening for genetic and chromosomal anomalies: Secondary | ICD-10-CM

## 2015-11-23 NOTE — Progress Notes (Signed)
Spoke to the patient that we received an amended copy of her genetic test results that was performed in May 04, 2014.  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  As we discussed over the phone, this variant's classification has been changed to "variant, Likely benign".  This means that we feel that this variant is most likely a non-disease causing change.  If you have any questions, please call me at [phone].

## 2015-11-30 ENCOUNTER — Other Ambulatory Visit: Payer: Self-pay | Admitting: *Deleted

## 2015-11-30 MED ORDER — POTASSIUM CHLORIDE ER 10 MEQ PO TBCR: 10.0000 meq | EXTENDED_RELEASE_TABLET | Freq: Two times a day (BID) | ORAL | Status: DC

## 2015-11-30 MED ORDER — GABAPENTIN 300 MG PO CAPS: 300.0000 mg | ORAL_CAPSULE | Freq: Every day | ORAL | Status: DC

## 2016-04-17 ENCOUNTER — Other Ambulatory Visit (HOSPITAL_BASED_OUTPATIENT_CLINIC_OR_DEPARTMENT_OTHER): Payer: Medicare Other

## 2016-04-17 DIAGNOSIS — C50919 Malignant neoplasm of unspecified site of unspecified female breast: Secondary | ICD-10-CM

## 2016-04-17 DIAGNOSIS — Z853 Personal history of malignant neoplasm of breast: Secondary | ICD-10-CM | POA: Diagnosis present

## 2016-04-17 DIAGNOSIS — Z1509 Genetic susceptibility to other malignant neoplasm: Secondary | ICD-10-CM

## 2016-04-17 DIAGNOSIS — Z1502 Genetic susceptibility to malignant neoplasm of ovary: Secondary | ICD-10-CM

## 2016-04-17 DIAGNOSIS — Z1589 Genetic susceptibility to other disease: Secondary | ICD-10-CM

## 2016-04-17 DIAGNOSIS — C50412 Malignant neoplasm of upper-outer quadrant of left female breast: Secondary | ICD-10-CM

## 2016-04-17 LAB — CBC WITH DIFFERENTIAL/PLATELET
BASO%: 0.2 % (ref 0.0–2.0)
Basophils Absolute: 0 10*3/uL (ref 0.0–0.1)
EOS%: 1.9 % (ref 0.0–7.0)
Eosinophils Absolute: 0.1 10*3/uL (ref 0.0–0.5)
HEMATOCRIT: 37.8 % (ref 34.8–46.6)
HEMOGLOBIN: 12.5 g/dL (ref 11.6–15.9)
LYMPH#: 1.2 10*3/uL (ref 0.9–3.3)
LYMPH%: 20.5 % (ref 14.0–49.7)
MCH: 29.6 pg (ref 25.1–34.0)
MCHC: 33.1 g/dL (ref 31.5–36.0)
MCV: 89.4 fL (ref 79.5–101.0)
MONO#: 0.4 10*3/uL (ref 0.1–0.9)
MONO%: 7.1 % (ref 0.0–14.0)
NEUT%: 70.3 % (ref 38.4–76.8)
NEUTROS ABS: 4.2 10*3/uL (ref 1.5–6.5)
Platelets: 216 10*3/uL (ref 145–400)
RBC: 4.23 10*6/uL (ref 3.70–5.45)
RDW: 13 % (ref 11.2–14.5)
WBC: 5.9 10*3/uL (ref 3.9–10.3)

## 2016-04-17 LAB — COMPREHENSIVE METABOLIC PANEL
ALBUMIN: 3.4 g/dL — AB (ref 3.5–5.0)
ALK PHOS: 146 U/L (ref 40–150)
ALT: 21 U/L (ref 0–55)
AST: 15 U/L (ref 5–34)
Anion Gap: 10 mEq/L (ref 3–11)
BILIRUBIN TOTAL: 1.37 mg/dL — AB (ref 0.20–1.20)
BUN: 8.9 mg/dL (ref 7.0–26.0)
CO2: 25 mEq/L (ref 22–29)
Calcium: 9.9 mg/dL (ref 8.4–10.4)
Chloride: 107 mEq/L (ref 98–109)
Creatinine: 1.1 mg/dL (ref 0.6–1.1)
EGFR: 60 mL/min/{1.73_m2} — ABNORMAL LOW (ref >90)
Glucose: 127 mg/dl (ref 70–140)
Potassium: 3.5 mEq/L (ref 3.5–5.1)
SODIUM: 142 meq/L (ref 136–145)
TOTAL PROTEIN: 7.5 g/dL (ref 6.4–8.3)

## 2016-04-24 ENCOUNTER — Other Ambulatory Visit: Payer: Self-pay | Admitting: Oncology

## 2016-04-24 ENCOUNTER — Ambulatory Visit (HOSPITAL_BASED_OUTPATIENT_CLINIC_OR_DEPARTMENT_OTHER): Payer: Medicare Other | Admitting: Oncology

## 2016-04-24 ENCOUNTER — Encounter (HOSPITAL_COMMUNITY): Payer: Self-pay

## 2016-04-24 ENCOUNTER — Ambulatory Visit (HOSPITAL_COMMUNITY)
Admission: RE | Admit: 2016-04-24 | Discharge: 2016-04-24 | Disposition: A | Discharge status: Home / Self Care | Payer: Medicare Other | Source: Ambulatory Visit | Attending: Oncology | Admitting: Oncology

## 2016-04-24 ENCOUNTER — Telehealth: Payer: Self-pay | Admitting: Oncology

## 2016-04-24 VITALS — BP 129/98 | HR 87 | Temp 97.9°F | Resp 18 | Ht 62.0 in | Wt 242.7 lb

## 2016-04-24 DIAGNOSIS — Z1502 Genetic susceptibility to malignant neoplasm of ovary: Principal | ICD-10-CM

## 2016-04-24 DIAGNOSIS — C50412 Malignant neoplasm of upper-outer quadrant of left female breast: Secondary | ICD-10-CM

## 2016-04-24 DIAGNOSIS — Z1589 Genetic susceptibility to other disease: Secondary | ICD-10-CM | POA: Diagnosis not present

## 2016-04-24 DIAGNOSIS — Z853 Personal history of malignant neoplasm of breast: Secondary | ICD-10-CM | POA: Diagnosis not present

## 2016-04-24 DIAGNOSIS — R918 Other nonspecific abnormal finding of lung field: Secondary | ICD-10-CM

## 2016-04-24 DIAGNOSIS — Z1509 Genetic susceptibility to other malignant neoplasm: Principal | ICD-10-CM

## 2016-04-24 DIAGNOSIS — E041 Nontoxic single thyroid nodule: Secondary | ICD-10-CM

## 2016-04-24 DIAGNOSIS — C50919 Malignant neoplasm of unspecified site of unspecified female breast: Secondary | ICD-10-CM

## 2016-04-24 DIAGNOSIS — C189 Malignant neoplasm of colon, unspecified: Secondary | ICD-10-CM

## 2016-04-24 MED ORDER — IOPAMIDOL (ISOVUE-300) INJECTION 61%
75.0000 mL | Freq: Once | INTRAVENOUS | Status: AC | PRN
  Administered 2016-04-24: 75 mL via INTRAVENOUS

## 2016-04-24 NOTE — Telephone Encounter (Signed)
appt made and avs printed °

## 2016-04-24 NOTE — Progress Notes (Signed)
[name]  Telephone:(336) [phone] Fax:(336) [phone]     ID: [name] DOB: [birth year]  MR#: [id number]  JYN#:[id number]  PCP: Maximino Greenland, MD GYN: SU: Rolm Bookbinder OTHER MD: Gery Pray  CHIEF COMPLAINT: Stage III breast cancer, triple negative   CURRENT TREATMENT:observation  BREAST CANCER HISTORY: From the original intake note:  [name] herself palpated a mass in her left breast, and immediately brought it to the attention of her primary care physician, Dr. Baird Cancer, who confirmed the finding and setup the patient for bilateral diagnostic mammography at the breast Center 02/20/2014. This showed a high density mass in the outer left breast measuring up to 3.7 cm. It corresponded to the site of palpable concern. In addition the normal 4 logically abnormal lymph nodes in the left axilla. The mass was palpable by exam, and by ultrasonography measured 2.6 cm. There were enlarged and morphologically abnormal lymph nodes in the left axilla, largest measuring 3 cm.  Biopsy of the left breast mass in question and 1 of the abnormal axillary lymph nodes 02/22/2014 showed ([id number]) biopsies to be positive for an invasive ductal carcinoma, grade 3, triple negative, with an MIB-1 of 90%. Specifically the HER-2 signals ratio was 1.05, and the number per cell 2.00.  MRI of the breasts 03/01/2014 showed a 3.8 cm enhancing mass with areas of apparent necrosis in the left breast, as well as the biopsy tract extending laterally, the combined measure 5.3 cm. There were no other masses or areas of abnormal enhancement. There were multiple abnormally enlarged left axillary lymph nodes with loss of the normal fatty hilum. These included level I and retropectoral lymph nodes.  The patient's subsequent history is as detailed below   INTERVAL HISTORY: [name] returns today for follow up of her PALB2 associated breast cancer. She is as always very positive and is "doing great and  feeling great". She had mammography February and it showed no suspicious finding.  She tells me both her daughters have not been tested for her genetic mutation and they are not carriers.  She was supposed to have had a scan before this visit to evaluate her lung nodules, but for some reason this is being done later today. We will have to call her with those results.  REVIEW OF SYSTEMS: She does do her usual activities without any limitations. 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 This dates back to her chemotherapy. It has not resolved but it has also not become any worse. She is still not exercising although she has bought a treadmill and she is "thinking about it". A detailed review of systems today was otherwise stable  PAST MEDICAL HISTORY: Past Medical History:  Diagnosis Date  . Colon cancer ([place])   . Hx of rotator cuff surgery   . left breast cancer   . Radiation 11/15/14-01/02/15   left breast, axillary and supraclavicular region 45 gray, lumpectomy cavity boosted to 16 gray    PAST SURGICAL HISTORY: Past Surgical History:  Procedure Laterality Date  . ABDOMINAL HYSTERECTOMY  1988  . COLON SURGERY  1988   colectomy/colostomy-ca  . COLON SURGERY  1988   colostomy takedown-reversal  . COLONOSCOPY    . RADIOACTIVE SEED GUIDED MASTECTOMY WITH AXILLARY SENTINEL LYMPH NODE BIOPSY Left 09/20/2014   Procedure: RADIOACTIVE SEED GUIDED LEFT BREAST LUMPECTOMY WITH AXILLARY SENTINEL LYMPH NODE BIOPSY;  Surgeon: Rolm Bookbinder, MD;  Location: [facility];  Service: General;  Laterality: Left;  .  TOTAL SHOULDER ARTHROPLASTY  2009   right    FAMILY HISTORY Family History  Problem Relation Age of Onset  . Cancer Mother 83    breast  . Cancer Maternal Aunt     breast cancer at unknown age   the patient's father died at the age of 69 following a stroke in the setting of diabetes; the patient's mother is living at 59. The  patient has 3 brothers and 2 sisters. [name], [name], was diagnosed with breast cancer in her early 70s. There is no other breast or ovarian cancer in the family. The patient herself was diagnosed with watts by history he is an incidentally found stage I colon carcinoma noted at the time of her hysterectomy. She had a partial colectomy but no adjuvant treatment. We have no records regarding that procedure  GYNECOLOGIC HISTORY:  No LMP recorded. Patient has had a hysterectomy. Menarche age 65, first live birth age 46, the patient underwent total abdominal hysterectomy with bilateral salpingo-oophorectomy in her 62s. She did not take hormone replacement. She did not take oral contraceptives at any point.  SOCIAL HISTORY:  Madie work for [employer] for about 40 years, retiring in 2008. She is divorced and lives alone, with no pets. Her daughter [name] works for [employer] in [employer] [place]. The second daughter, [name], works as a Research scientist (physical sciences) for [employer]. The patient has 6 grandchildren and one great-grandchild. She attends a [employer].    ADVANCED DIRECTIVES: Not in place. The patient intends to name her daughter [name]. her healthcare power of attorney. [name] is work number is [id number]. On the patient's 03/01/2014 visit she was given a copy of the appropriate documents to complete and notarize at her discretion   HEALTH MAINTENANCE: Social History  Substance Use Topics  . Smoking status: Never Smoker  . Smokeless tobacco: Not on file  . Alcohol use Yes     Comment: occ-rare     Colonoscopy: 2000  PAP: May 2015  Bone density: 05/23/2004, at [facility] hospital; normal  Lipid panel:  No Known Allergies  Current Outpatient Prescriptions  Medication Sig Dispense Refill  . acetaminophen (TYLENOL) 500 MG tablet Take 1,000 mg by mouth every 6 (six) hours as needed for mild pain or moderate pain.    Marland Kitchen gabapentin (NEURONTIN) 300 MG capsule Take 1 capsule  (300 mg total) by mouth at bedtime. 90 capsule 4  . loteprednol (LOTEMAX) 0.5 % ophthalmic suspension Place 1 drop into both eyes 3 (three) times daily. Pt use this medication as needed.    . potassium chloride (K-DUR) 10 MEQ tablet Take 1 tablet (10 mEq total) by mouth 2 (two) times daily. 60 tablet 4   No current facility-administered medications for this visit.     OBJECTIVE: Middle-aged [place] American woman In no acute distress  Vitals:   04/24/16 1017  BP: (!) 129/98  Pulse: 87  Resp: 18  Temp: 97.9 F (36.6 C)     Body mass index is 44.39 kg/m.    ECOG FS:0 - Asymptomatic  Sclerae unicteric, EOMs intact Oropharynx clear and moist No cervical or supraclavicular adenopathy Lungs no rales or rhonchi Heart regular rate and rhythm Abd soft, obese, nontender, positive bowel sounds MSK no focal spinal tenderness, no upper extremity lymphedema Neuro: nonfocal, well oriented, appropriate affect Breasts: The right breast is unremarkable. The left breast status post lumpectomy and radiation. There is induration related to the scar, as expected. The left axilla is benign.  LAB RESULTS:  No results found for: SPEP  Lab Results  Component Value Date   WBC 5.9 04/17/2016   NEUTROABS 4.2 04/17/2016   HGB 12.5 04/17/2016   HCT 37.8 04/17/2016   MCV 89.4 04/17/2016   PLT 216 04/17/2016      Chemistry      Component Value Date/Time   NA 142 04/17/2016 1546   K 3.5 04/17/2016 1546   CL 107 03/13/2015 0810   CO2 25 04/17/2016 1546   BUN 8.9 04/17/2016 1546   CREATININE 1.1 04/17/2016 1546      Component Value Date/Time   CALCIUM 9.9 04/17/2016 1546   ALKPHOS 146 04/17/2016 1546   AST 15 04/17/2016 1546   ALT 21 04/17/2016 1546   BILITOT 1.37 (H) 04/17/2016 1546       No results found for: LABCA2  No components found for: LABCA125  No results for input(s): INR in the last 168 hours.  Urinalysis    Component Value Date/Time   COLORURINE YELLOW 04/05/2008 1120    APPEARANCEUR CLEAR 04/05/2008 1120   LABSPEC 1.021 04/05/2008 1120   PHURINE 6.0 04/05/2008 1120   GLUCOSEU NEGATIVE 04/05/2008 1120   HGBUR NEGATIVE 04/05/2008 1120   BILIRUBINUR NEGATIVE 04/05/2008 1120   KETONESUR NEGATIVE 04/05/2008 1120   PROTEINUR NEGATIVE 04/05/2008 1120   UROBILINOGEN 0.2 04/05/2008 1120   NITRITE NEGATIVE 04/05/2008 1120   LEUKOCYTESUR  04/05/2008 1120    NEGATIVE MICROSCOPIC NOT DONE ON URINES WITH NEGATIVE PROTEIN, BLOOD, LEUKOCYTES, NITRITE, OR GLUCOSE <1000 mg/dL.    STUDIES: CLINICAL DATA:  History of left breast cancer in late 2015 status post lumpectomy in early 2016.  EXAM: DIGITAL DIAGNOSTIC LEFT MAMMOGRAM WITH 3D TOMOSYNTHESIS AND CAD  COMPARISON:  Previous exam(s).  ACR Breast Density Category b: There are scattered areas of fibroglandular density.  FINDINGS: There are expected postsurgical changes within the left breast.  There are no new dominant masses, suspicious calcifications or secondary signs of malignancy within either breast.  Mammographic images were processed with CAD.  IMPRESSION: No evidence of malignancy within either breast. Expected postsurgical changes in the left breast.  RECOMMENDATION: Bilateral diagnostic mammogram in 1 year.  I have discussed the findings and recommendations with the patient. Results were also provided in writing at the conclusion of the visit. If applicable, a reminder letter will be sent to the patient regarding the next appointment.  BI-RADS CATEGORY  2: Benign.   Electronically Signed   By: Franki Cabot M.D.   On: 11/01/2015 16:02  ASSESSMENT: 65 y.o. BRCA negative [place] woman s/p Left breast upper outer quadrant and left axillary lymph node biopsy 02/22/2014, both positive for a clinical T2 N2, stage IIIA invasive ductal carcinoma, grade 3, triple negative, with an MIB-1 of 90%  (1) neoadjuvant chemotherapy started 03/13/2014, with cyclophosphamide and  doxorubicin in dose dense fashion x4, with Neulasta support on day 2, completed 04/24/2014, to be followed by weekly carboplatin and paclitaxel x12, paclitaxel reduced during cycle 5 and cycle 7 because of elevated bilirubin levels  (2) left lumpectomy and sentinel lymph node sampling 09/20/2014 showed a complete pathologic response.  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  (b) PALB2 mutations are known to increase the risk of  breast and pancreatic cancer, possibly other cancers, but the data is preliminary  (c) the patient's 2 daughters have been tested and did not carry the gene  (d) Mckaela's MSH2 VUS has been reclassified as "likely benign"  (6) staging studies showed right-sided lung nodules, too small to characterize, and a dominant right-sided thyroid nodule unchanged in size as compared to 2011; to be followed  PLAN: Shatira is now a year and a half out from definitive surgery for her triple negative breast cancer with no evidence of disease recurrence. This is very favorable.  We reviewed her genetics situation, and breast MRI is recommended yearly. However she was unable to tolerate the procedure. For now were going to live with the mammography and mammography every year.  She is going to have her next mammogram in February. She will see me next March. At that point I will start seeing her on a once a year basis until she completes her 5 years of follow-up.     Chauncey Cruel, MD   04/24/2016 10:25 AM

## 2016-04-27 ENCOUNTER — Encounter: Payer: Self-pay | Admitting: Oncology

## 2016-04-27 ENCOUNTER — Other Ambulatory Visit: Payer: Self-pay | Admitting: Oncology

## 2016-09-11 ENCOUNTER — Other Ambulatory Visit: Payer: Self-pay | Admitting: Nurse Practitioner

## 2016-10-06 DIAGNOSIS — Z1211 Encounter for screening for malignant neoplasm of colon: Secondary | ICD-10-CM | POA: Diagnosis not present

## 2016-10-06 DIAGNOSIS — Z124 Encounter for screening for malignant neoplasm of cervix: Secondary | ICD-10-CM | POA: Diagnosis not present

## 2016-10-06 DIAGNOSIS — C50911 Malignant neoplasm of unspecified site of right female breast: Secondary | ICD-10-CM | POA: Diagnosis not present

## 2016-10-06 DIAGNOSIS — E669 Obesity, unspecified: Secondary | ICD-10-CM | POA: Diagnosis not present

## 2016-10-06 DIAGNOSIS — N760 Acute vaginitis: Secondary | ICD-10-CM | POA: Diagnosis not present

## 2016-10-06 DIAGNOSIS — Z0000 Encounter for general adult medical examination without abnormal findings: Secondary | ICD-10-CM | POA: Diagnosis not present

## 2016-10-06 DIAGNOSIS — Z792 Long term (current) use of antibiotics: Secondary | ICD-10-CM | POA: Diagnosis not present

## 2016-10-06 DIAGNOSIS — Z1231 Encounter for screening mammogram for malignant neoplasm of breast: Secondary | ICD-10-CM | POA: Diagnosis not present

## 2016-10-22 ENCOUNTER — Other Ambulatory Visit: Payer: Self-pay | Admitting: General Surgery

## 2016-10-22 DIAGNOSIS — Z853 Personal history of malignant neoplasm of breast: Secondary | ICD-10-CM

## 2016-11-03 ENCOUNTER — Ambulatory Visit
Admission: RE | Admit: 2016-11-03 | Discharge: 2016-11-03 | Disposition: A | Discharge status: Home / Self Care | Payer: Medicare Other | Source: Ambulatory Visit | Attending: General Surgery | Admitting: General Surgery

## 2016-11-03 DIAGNOSIS — Z853 Personal history of malignant neoplasm of breast: Secondary | ICD-10-CM

## 2016-11-03 DIAGNOSIS — R928 Other abnormal and inconclusive findings on diagnostic imaging of breast: Secondary | ICD-10-CM | POA: Diagnosis not present

## 2016-11-03 HISTORY — DX: Personal history of irradiation: Z92.3

## 2016-11-03 HISTORY — DX: Personal history of antineoplastic chemotherapy: Z92.21

## 2016-11-07 ENCOUNTER — Telehealth: Payer: Self-pay | Admitting: Oncology

## 2016-11-07 NOTE — Telephone Encounter (Signed)
Spoke with patient and confirmed appointment changes

## 2016-11-13 ENCOUNTER — Other Ambulatory Visit (HOSPITAL_BASED_OUTPATIENT_CLINIC_OR_DEPARTMENT_OTHER): Payer: Medicare Other

## 2016-11-13 DIAGNOSIS — Z1509 Genetic susceptibility to other malignant neoplasm: Secondary | ICD-10-CM

## 2016-11-13 DIAGNOSIS — Z853 Personal history of malignant neoplasm of breast: Secondary | ICD-10-CM | POA: Diagnosis present

## 2016-11-13 DIAGNOSIS — Z1502 Genetic susceptibility to malignant neoplasm of ovary: Secondary | ICD-10-CM

## 2016-11-13 DIAGNOSIS — C50412 Malignant neoplasm of upper-outer quadrant of left female breast: Secondary | ICD-10-CM

## 2016-11-13 DIAGNOSIS — C50919 Malignant neoplasm of unspecified site of unspecified female breast: Secondary | ICD-10-CM

## 2016-11-13 DIAGNOSIS — Z1589 Genetic susceptibility to other disease: Secondary | ICD-10-CM

## 2016-11-13 LAB — CBC WITH DIFFERENTIAL/PLATELET
BASO%: 0.4 % (ref 0.0–2.0)
Basophils Absolute: 0 10*3/uL (ref 0.0–0.1)
EOS%: 3.2 % (ref 0.0–7.0)
Eosinophils Absolute: 0.1 10*3/uL (ref 0.0–0.5)
HCT: 37.5 % (ref 34.8–46.6)
HEMOGLOBIN: 12.6 g/dL (ref 11.6–15.9)
LYMPH%: 35.4 % (ref 14.0–49.7)
MCH: 30.3 pg (ref 25.1–34.0)
MCHC: 33.5 g/dL (ref 31.5–36.0)
MCV: 90.2 fL (ref 79.5–101.0)
MONO#: 0.4 10*3/uL (ref 0.1–0.9)
MONO%: 7.8 % (ref 0.0–14.0)
NEUT%: 53.2 % (ref 38.4–76.8)
NEUTROS ABS: 2.4 10*3/uL (ref 1.5–6.5)
Platelets: 258 10*3/uL (ref 145–400)
RBC: 4.15 10*6/uL (ref 3.70–5.45)
RDW: 14 % (ref 11.2–14.5)
WBC: 4.6 10*3/uL (ref 3.9–10.3)
lymph#: 1.6 10*3/uL (ref 0.9–3.3)

## 2016-11-13 LAB — COMPREHENSIVE METABOLIC PANEL
ALBUMIN: 3.5 g/dL (ref 3.5–5.0)
ALK PHOS: 140 U/L (ref 40–150)
ALT: 15 U/L (ref 0–55)
AST: 13 U/L (ref 5–34)
Anion Gap: 8 mEq/L (ref 3–11)
BUN: 11.5 mg/dL (ref 7.0–26.0)
CO2: 27 meq/L (ref 22–29)
Calcium: 9.8 mg/dL (ref 8.4–10.4)
Chloride: 107 mEq/L (ref 98–109)
Creatinine: 1 mg/dL (ref 0.6–1.1)
EGFR: 68 mL/min/{1.73_m2} — ABNORMAL LOW (ref >90)
GLUCOSE: 122 mg/dL (ref 70–140)
POTASSIUM: 4.1 meq/L (ref 3.5–5.1)
Sodium: 142 mEq/L (ref 136–145)
Total Bilirubin: 0.86 mg/dL (ref 0.20–1.20)
Total Protein: 7.4 g/dL (ref 6.4–8.3)

## 2016-11-20 ENCOUNTER — Ambulatory Visit: Payer: Medicare Other | Admitting: Oncology

## 2016-11-28 ENCOUNTER — Ambulatory Visit (HOSPITAL_BASED_OUTPATIENT_CLINIC_OR_DEPARTMENT_OTHER): Payer: Medicare Other | Admitting: Oncology

## 2016-11-28 ENCOUNTER — Telehealth: Payer: Self-pay | Admitting: Oncology

## 2016-11-28 VITALS — BP 143/92 | HR 82 | Temp 98.0°F | Resp 18 | Ht 62.0 in | Wt 250.5 lb

## 2016-11-28 DIAGNOSIS — Z1509 Genetic susceptibility to other malignant neoplasm: Secondary | ICD-10-CM

## 2016-11-28 DIAGNOSIS — Z171 Estrogen receptor negative status [ER-]: Secondary | ICD-10-CM | POA: Diagnosis not present

## 2016-11-28 DIAGNOSIS — Z1502 Genetic susceptibility to malignant neoplasm of ovary: Secondary | ICD-10-CM

## 2016-11-28 DIAGNOSIS — C773 Secondary and unspecified malignant neoplasm of axilla and upper limb lymph nodes: Secondary | ICD-10-CM

## 2016-11-28 DIAGNOSIS — C189 Malignant neoplasm of colon, unspecified: Secondary | ICD-10-CM

## 2016-11-28 DIAGNOSIS — C50412 Malignant neoplasm of upper-outer quadrant of left female breast: Secondary | ICD-10-CM | POA: Diagnosis not present

## 2016-11-28 DIAGNOSIS — Z1589 Genetic susceptibility to other disease: Secondary | ICD-10-CM

## 2016-11-28 DIAGNOSIS — C50919 Malignant neoplasm of unspecified site of unspecified female breast: Secondary | ICD-10-CM

## 2016-11-28 NOTE — Telephone Encounter (Signed)
Gave patient AVS and calender per 3/16 los. Per Dr.Magrinat To see patient in one year.

## 2016-11-28 NOTE — Progress Notes (Signed)
[name]  [phone] [phone]     ID: [name] DOB: [birth year]  MR#: [id number]  QIW#:[id number]  PCP: Maximino Greenland, MD GYN: SU: Rolm Bookbinder OTHER MD: Gery Pray  CHIEF COMPLAINT: Stage III breast cancer, triple negative   CURRENT TREATMENT:observation  BREAST CANCER HISTORY: From the original intake note:  Tane herself palpated a mass in her left breast, and immediately brought it to the attention of her primary care physician, Dr. Baird Cancer, who confirmed the finding and setup the patient for bilateral diagnostic mammography at the [facility] Center 02/20/2014. This showed a high density mass in the outer left breast measuring up to 3.7 cm. It corresponded to the site of palpable concern. In addition the normal 4 logically abnormal lymph nodes in the left axilla. The mass was palpable by exam, and by ultrasonography measured 2.6 cm. There were enlarged and morphologically abnormal lymph nodes in the left axilla, largest measuring 3 cm.  Biopsy of the left breast mass in question and 1 of the abnormal axillary lymph nodes 02/22/2014 showed ([id number]) biopsies to be positive for an invasive ductal carcinoma, grade 3, triple negative, with an MIB-1 of 90%. Specifically the HER-2 signals ratio was 1.05, and the number per cell 2.00.  MRI of the breasts 03/01/2014 showed a 3.8 cm enhancing mass with areas of apparent necrosis in the left breast, as well as the biopsy tract extending laterally, the combined measure 5.3 cm. There were no other masses or areas of abnormal enhancement. There were multiple abnormally enlarged left axillary lymph nodes with loss of the normal fatty hilum. These included level I and retropectoral lymph nodes.  The patient's subsequent history is as detailed below   INTERVAL HISTORY: [name] returns today for follow-up of her estrogen receptor positive breast cancer. She also carries a PALB2 mutation, Which puts  her at risk of further breast cancer developing as well as other cancers that we have no screening options for  She is tolerating tamoxifen well, with no unusual side effects. Hot flashes or vaginal wetness are not major concerns. She obtains a drug at a good price.  REVIEW OF SYSTEMS: She tells me she is doing "great". 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 She spends time with her family and friends and works at her church. She still has neuropathy symptoms and she feels taking the gabapentin at bedtime really helps. A detailed review of systems today was otherwise noncontributory  PAST MEDICAL HISTORY: Past Medical History:  Diagnosis Date  . Colon cancer ([place])   . Hx of rotator cuff surgery   . left breast cancer   . Personal history of chemotherapy 2016   left  . Personal history of radiation therapy 2016   left breast   . Radiation 11/15/14-01/02/15   left breast, axillary and supraclavicular region 45 gray, lumpectomy cavity boosted to 16 gray    PAST SURGICAL HISTORY: Past Surgical History:  Procedure Laterality Date  . ABDOMINAL HYSTERECTOMY  1988  . COLON SURGERY  1988   colectomy/colostomy-ca  . COLON SURGERY  1988   colostomy takedown-reversal  . COLONOSCOPY    . EXCISION / BIOPSY BREAST / NIPPLE / DUCT Left 09/20/2014  . RADIOACTIVE SEED GUIDED MASTECTOMY WITH AXILLARY SENTINEL LYMPH NODE BIOPSY Left 09/20/2014   Procedure: RADIOACTIVE SEED GUIDED LEFT BREAST LUMPECTOMY WITH AXILLARY SENTINEL LYMPH NODE BIOPSY;  Surgeon: Rolm Bookbinder, MD;  Location: [facility];  Service: General;  Laterality: Left;  . TOTAL SHOULDER ARTHROPLASTY  2009   right    FAMILY HISTORY Family History  Problem Relation Age of Onset  . Cancer Mother 86    breast  . Cancer Maternal Aunt     breast cancer at unknown age   the patient's father died at the age of 65 following a stroke in the setting of diabetes; the patient's mother is living at 66. The  patient has 3 brothers and 2 sisters. [name], [name], was diagnosed with breast cancer in her early 55s. There is no other breast or ovarian cancer in the family. The patient herself was diagnosed with watts by history he is an incidentally found stage I colon carcinoma noted at the time of her hysterectomy. She had a partial colectomy but no adjuvant treatment. We have no records regarding that procedure  GYNECOLOGIC HISTORY:  No LMP recorded. Patient has had a hysterectomy. Menarche age 52, first live birth age 55, the patient underwent total abdominal hysterectomy with bilateral salpingo-oophorectomy in her 32s. She did not take hormone replacement. She did not take oral contraceptives at any point.  SOCIAL HISTORY:  [place] work for [employer] for about 40 years, retiring in 2008. She is divorced and lives alone, with no pets. Her daughter Andrian Urbach works for [employer] in Press photographer. The second daughter, Reita Shindler, works as a Research scientist (physical sciences) for [employer]. The patient has 6 grandchildren and one great-grandchild. She attends a [employer].    ADVANCED DIRECTIVES: Not in place. The patient intends to name her daughter [name]. her healthcare power of attorney. [name] is work number is [id number]. On the patient's 03/01/2014 visit she was given a copy of the appropriate documents to complete and notarize at her discretion   HEALTH MAINTENANCE: Social History  Substance Use Topics  . Smoking status: Never Smoker  . Smokeless tobacco: Not on file  . Alcohol use Yes     Comment: occ-rare     Colonoscopy: 2000  PAP: May 2015  Bone density: 05/23/2004, at [facility] hospital; normal  Lipid panel:  No Known Allergies  Current Outpatient Prescriptions  Medication Sig Dispense Refill  . acetaminophen (TYLENOL) 500 MG tablet Take 1,000 mg by mouth every 6 (six) hours as needed for mild pain or moderate pain.    Marland Kitchen gabapentin (NEURONTIN) 300 MG capsule Take 1 capsule  (300 mg total) by mouth at bedtime. 90 capsule 4  . loteprednol (LOTEMAX) 0.5 % ophthalmic suspension Place 1 drop into both eyes 3 (three) times daily. Pt use this medication as needed.    . potassium chloride (K-DUR) 10 MEQ tablet Take 1 tablet (10 mEq total) by mouth 2 (two) times daily. 60 tablet 4   No current facility-administered medications for this visit.     OBJECTIVE: Morbidly obese African American woman who appears stated age 52:   11/28/16 1450  BP: (!) 143/92  Pulse: 82  Resp: 18  Temp: 98 F (36.7 C)     Body mass index is 45.82 kg/m.    ECOG FS:0 - Asymptomatic  Sclerae unicteric, pupils round and equal Oropharynx clear and moist-- no thrush or other lesions No cervical or supraclavicular adenopathy Lungs no rales or rhonchi Heart regular rate and rhythm Abd soft, obese, nontender, positive bowel sounds MSK no focal spinal tenderness, no upper extremity lymphedema Neuro: nonfocal, well oriented, appropriate affect Breasts: The right breast is unremarkable. The left breast shows some induration associated with the area of the  right or incision. This is expected. There is no evidence of local recurrence. Both axillae are benign.  LAB RESULTS:  No results found for: SPEP  Lab Results  Component Value Date   WBC 4.6 11/13/2016   NEUTROABS 2.4 11/13/2016   HGB 12.6 11/13/2016   HCT 37.5 11/13/2016   MCV 90.2 11/13/2016   PLT 258 11/13/2016      Chemistry      Component Value Date/Time   NA 142 11/13/2016 1050   K 4.1 11/13/2016 1050   CL 107 03/13/2015 0810   CO2 27 11/13/2016 1050   BUN 11.5 11/13/2016 1050   CREATININE 1.0 11/13/2016 1050      Component Value Date/Time   CALCIUM 9.8 11/13/2016 1050   ALKPHOS 140 11/13/2016 1050   AST 13 11/13/2016 1050   ALT 15 11/13/2016 1050   BILITOT 0.86 11/13/2016 1050       No results found for: LABCA2  No components found for: LABCA125  No results for input(s): INR in the last 168  hours.  Urinalysis    Component Value Date/Time   COLORURINE YELLOW 04/05/2008 1120   APPEARANCEUR CLEAR 04/05/2008 1120   LABSPEC 1.021 04/05/2008 1120   PHURINE 6.0 04/05/2008 1120   GLUCOSEU NEGATIVE 04/05/2008 1120   HGBUR NEGATIVE 04/05/2008 1120   BILIRUBINUR NEGATIVE 04/05/2008 1120   KETONESUR NEGATIVE 04/05/2008 1120   PROTEINUR NEGATIVE 04/05/2008 1120   UROBILINOGEN 0.2 04/05/2008 1120   NITRITE NEGATIVE 04/05/2008 1120   LEUKOCYTESUR  04/05/2008 1120    NEGATIVE MICROSCOPIC NOT DONE ON URINES WITH NEGATIVE PROTEIN, BLOOD, LEUKOCYTES, NITRITE, OR GLUCOSE <1000 mg/dL.    STUDIES: CLINICAL DATA:  Annual examination. History of left breast cancer diagnosed in 2015. She has undergone neoadjuvant chemotherapy, lumpectomy, and axillary dissection.She is asymptomatic.  EXAM: 2D DIGITAL DIAGNOSTIC BILATERAL MAMMOGRAM WITH CAD AND ADJUNCT TOMO  COMPARISON:  Previous exam(s).  ACR Breast Density Category b: There are scattered areas of fibroglandular density.  FINDINGS: There are lumpectomy changes in the upper central left breast and there are surgical clips in left axilla. No mass, nonsurgical distortion, or suspicious microcalcification is identified in either breast to suggest malignancy.  Mammographic images were processed with CAD.  IMPRESSION: No evidence of malignancy in either breast. Lumpectomy changes on the left.  RECOMMENDATION: Diagnostic mammogram is suggested in 1 year. (Code:[id number]-B-01Y)  I have discussed the findings and recommendations with the patient. Results were also provided in writing at the conclusion of the visit. If applicable, a reminder letter will be sent to the patient regarding the next appointment.  BI-RADS CATEGORY  2: Benign.   Electronically Signed   By: Curlene Dolphin M.D.   On: 11/03/2016 11:07   ASSESSMENT: 66 y.o. BRCA negative [place] woman s/p Left breast upper outer quadrant and left axillary lymph  node biopsy 02/22/2014, both positive for a clinical T2 N2, stage IIIA invasive ductal carcinoma, grade 3, triple negative, with an MIB-1 of 90%  (1) neoadjuvant chemotherapy started 03/13/2014, with cyclophosphamide and doxorubicin in dose dense fashion x4, with Neulasta support on day 2, completed 04/24/2014, to be followed by weekly carboplatin and paclitaxel x12, paclitaxel reduced during cycle 5 and cycle 7 because of elevated bilirubin levels  (2) left lumpectomy and sentinel lymph node sampling 09/20/2014 showed a complete pathologic response.  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  (b) PALB2 mutations are known to increase the risk of breast and pancreatic cancer, possibly other cancers, but the data is preliminary  (c) the patient's 2 daughters have been tested and did not carry the gene  (d) Estrella's MSH2 VUS has been reclassified as "likely benign"  (e) she is status post remote TAH/BSO  (f) patient is unable to tolerate breast MRI  (6) staging studies showed right-sided lung nodules, too small to characterize, and a dominant right-sided thyroid nodule unchanged in size as compared to 2011; to be followed  PLAN: Marlynn is now a little over 2 years out from definitive surgery for her breast cancer with no evidence of disease recurrence. This is very favorable.  She continues on tamoxifen with good tolerance. The plan is to continue that for a total of 5 years.  As far as her PALB2 mutation is concerned, She understands we would prefer to do a breast MRI in addition to yearly mammography, but she was simply not able to tolerate that procedure. We are going to continue with  yearly mammography and she will see me again in one year, after her next mammogram  She knows to call for any other problems develop before her next visit    Lamari Youngers C, MD   11/28/2016 3:03 PM

## 2016-12-01 DIAGNOSIS — C50912 Malignant neoplasm of unspecified site of left female breast: Secondary | ICD-10-CM | POA: Diagnosis not present

## 2016-12-15 ENCOUNTER — Other Ambulatory Visit: Payer: Self-pay | Admitting: Oncology

## 2017-04-06 ENCOUNTER — Other Ambulatory Visit: Payer: Self-pay

## 2017-07-28 DIAGNOSIS — Z23 Encounter for immunization: Secondary | ICD-10-CM | POA: Diagnosis not present

## 2017-07-28 DIAGNOSIS — C50912 Malignant neoplasm of unspecified site of left female breast: Secondary | ICD-10-CM | POA: Diagnosis not present

## 2017-07-31 ENCOUNTER — Other Ambulatory Visit: Payer: Self-pay | Admitting: General Surgery

## 2017-07-31 DIAGNOSIS — N644 Mastodynia: Secondary | ICD-10-CM

## 2017-08-10 ENCOUNTER — Other Ambulatory Visit: Payer: Self-pay | Admitting: Oncology

## 2017-08-12 ENCOUNTER — Ambulatory Visit
Admission: RE | Admit: 2017-08-12 | Discharge: 2017-08-12 | Disposition: A | Discharge status: Home / Self Care | Payer: Medicare Other | Source: Ambulatory Visit | Attending: General Surgery | Admitting: General Surgery

## 2017-08-12 DIAGNOSIS — N644 Mastodynia: Secondary | ICD-10-CM

## 2017-08-12 DIAGNOSIS — R928 Other abnormal and inconclusive findings on diagnostic imaging of breast: Secondary | ICD-10-CM | POA: Diagnosis not present

## 2017-08-12 DIAGNOSIS — N6489 Other specified disorders of breast: Secondary | ICD-10-CM | POA: Diagnosis not present

## 2017-10-26 ENCOUNTER — Other Ambulatory Visit: Payer: Self-pay | Admitting: General Surgery

## 2017-10-26 DIAGNOSIS — Z853 Personal history of malignant neoplasm of breast: Secondary | ICD-10-CM

## 2017-10-30 DIAGNOSIS — Z0000 Encounter for general adult medical examination without abnormal findings: Secondary | ICD-10-CM | POA: Diagnosis not present

## 2017-10-30 DIAGNOSIS — C50912 Malignant neoplasm of unspecified site of left female breast: Secondary | ICD-10-CM | POA: Diagnosis not present

## 2017-10-30 DIAGNOSIS — C50911 Malignant neoplasm of unspecified site of right female breast: Secondary | ICD-10-CM | POA: Diagnosis not present

## 2017-10-30 DIAGNOSIS — Z1211 Encounter for screening for malignant neoplasm of colon: Secondary | ICD-10-CM | POA: Diagnosis not present

## 2017-10-30 DIAGNOSIS — R799 Abnormal finding of blood chemistry, unspecified: Secondary | ICD-10-CM | POA: Diagnosis not present

## 2017-10-30 DIAGNOSIS — E2839 Other primary ovarian failure: Secondary | ICD-10-CM | POA: Diagnosis not present

## 2017-10-30 DIAGNOSIS — Z1231 Encounter for screening mammogram for malignant neoplasm of breast: Secondary | ICD-10-CM | POA: Diagnosis not present

## 2017-10-30 DIAGNOSIS — Z124 Encounter for screening for malignant neoplasm of cervix: Secondary | ICD-10-CM | POA: Diagnosis not present

## 2017-11-05 ENCOUNTER — Ambulatory Visit
Admission: RE | Admit: 2017-11-05 | Discharge: 2017-11-05 | Disposition: A | Discharge status: Home / Self Care | Payer: Medicare Other | Source: Ambulatory Visit | Attending: General Surgery | Admitting: General Surgery

## 2017-11-05 ENCOUNTER — Other Ambulatory Visit: Payer: Self-pay | Admitting: General Surgery

## 2017-11-05 DIAGNOSIS — Z853 Personal history of malignant neoplasm of breast: Secondary | ICD-10-CM

## 2017-11-05 DIAGNOSIS — N6001 Solitary cyst of right breast: Secondary | ICD-10-CM | POA: Diagnosis not present

## 2017-11-05 DIAGNOSIS — N6310 Unspecified lump in the right breast, unspecified quadrant: Secondary | ICD-10-CM

## 2017-11-05 DIAGNOSIS — R928 Other abnormal and inconclusive findings on diagnostic imaging of breast: Secondary | ICD-10-CM | POA: Diagnosis not present

## 2017-11-06 ENCOUNTER — Ambulatory Visit
Admission: RE | Admit: 2017-11-06 | Discharge: 2017-11-06 | Disposition: A | Discharge status: Home / Self Care | Payer: Medicare Other | Source: Ambulatory Visit | Attending: General Surgery | Admitting: General Surgery

## 2017-11-06 ENCOUNTER — Other Ambulatory Visit: Payer: Self-pay | Admitting: Internal Medicine

## 2017-11-06 DIAGNOSIS — N6311 Unspecified lump in the right breast, upper outer quadrant: Secondary | ICD-10-CM | POA: Diagnosis not present

## 2017-11-06 DIAGNOSIS — N6310 Unspecified lump in the right breast, unspecified quadrant: Secondary | ICD-10-CM

## 2017-11-06 DIAGNOSIS — N6313 Unspecified lump in the right breast, lower outer quadrant: Secondary | ICD-10-CM | POA: Diagnosis not present

## 2017-11-06 DIAGNOSIS — E2839 Other primary ovarian failure: Secondary | ICD-10-CM

## 2017-11-06 DIAGNOSIS — D241 Benign neoplasm of right breast: Secondary | ICD-10-CM | POA: Diagnosis not present

## 2017-11-24 NOTE — Progress Notes (Signed)
[name]  [phone] Fax:([phone]) [phone]     ID: [name] DOB: [birth year]  MR#: [id number]  VWP#:[id number]  Patient Care Team: Glendale Chard, MD as PCP - General (Internal Medicine) Krissie Merrick, Virgie Dad, MD as Consulting Physician (Oncology) Rolm Bookbinder, MD as Consulting Physician (General Surgery) Gery Pray, MD as Consulting Physician (Radiation Oncology)   CHIEF COMPLAINT: Stage III breast cancer, triple negative, in patient with high risk mutation  CURRENT TREATMENT: Intensified screening  BREAST CANCER HISTORY: From the original intake note:  [name] herself palpated a mass in her left breast, and immediately brought it to the attention of her primary care physician, Dr. Baird Cancer, who confirmed the finding and setup the patient for bilateral diagnostic mammography at the [facility] Center 02/20/2014. This showed a high density mass in the outer left breast measuring up to 3.7 cm. It corresponded to the site of palpable concern. In addition the normal 4 logically abnormal lymph nodes in the left axilla. The mass was palpable by exam, and by ultrasonography measured 2.6 cm. There were enlarged and morphologically abnormal lymph nodes in the left axilla, largest measuring 3 cm.  Biopsy of the left breast mass in question and 1 of the abnormal axillary lymph nodes 02/22/2014 showed ([id number]) biopsies to be positive for an invasive ductal carcinoma, grade 3, triple negative, with an MIB-1 of 90%. Specifically the HER-2 signals ratio was 1.05, and the number per cell 2.00.  MRI of the breasts 03/01/2014 showed a 3.8 cm enhancing mass with areas of apparent necrosis in the left breast, as well as the biopsy tract extending laterally, the combined measure 5.3 cm. There were no other masses or areas of abnormal enhancement. There were multiple abnormally enlarged left axillary lymph nodes with loss of the normal fatty hilum. These included level I and  retropectoral lymph nodes.  The patient's subsequent history is as detailed below   INTERVAL HISTORY: [name] returns today for follow-up of her estrogen receptor negative breast cancer. She also carries a PALB2 mutation, which puts her at risk of further breast cancer developing as well as other cancers that we have no screening options for. She continues on observation. She is worried about the papilloma that was found on her recent mammogram.   Since her last visit, she underwent diagnostic bilateral mammography with CAD and tomography and right breast ultrasonography on 11/05/2017 at [place] showing: breast density category B. There is a solid mass in the lateral right breast at 9 o'clock. There was no axillary adenopathy. 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 [name] 12/01/2017 for consideration of excision.   REVIEW OF SYSTEMS: [name] reports that she is doing well. Her family is doing well also. She is able to exercise, but she has not been exercising regularly. She has a treadmill at home. She notes that she still has some neuropathy in her fingers and toes, but the Neurontin helps. She denies unusual headaches, visual changes, nausea, vomiting, or dizziness. There has been no unusual cough, phlegm production, or pleurisy. This been no change in bowel or bladder habits. She denies unexplained fatigue or unexplained weight loss, bleeding, rash, or fever. A detailed review of systems was otherwise stable.    PAST MEDICAL HISTORY: Past Medical History:  Diagnosis Date  . Colon cancer ([place])   . Hx of rotator cuff surgery   . left breast cancer   .  Personal history of chemotherapy 2016   left  . Personal history of radiation therapy 2016   left breast   . Radiation 11/15/14-01/02/15   left breast, axillary and supraclavicular region 45 gray, lumpectomy cavity boosted to 16 gray    PAST  SURGICAL HISTORY: Past Surgical History:  Procedure Laterality Date  . ABDOMINAL HYSTERECTOMY  1988  . BREAST LUMPECTOMY Left 2016  . COLON SURGERY  1988   colectomy/colostomy-ca  . COLON SURGERY  1988   colostomy takedown-reversal  . COLONOSCOPY    . EXCISION / BIOPSY BREAST / NIPPLE / DUCT Left 09/20/2014  . RADIOACTIVE SEED GUIDED PARTIAL MASTECTOMY WITH AXILLARY SENTINEL LYMPH NODE BIOPSY Left 09/20/2014   Procedure: RADIOACTIVE SEED GUIDED LEFT BREAST LUMPECTOMY WITH AXILLARY SENTINEL LYMPH NODE BIOPSY;  Surgeon: Rolm Bookbinder, MD;  Location: [facility];  Service: General;  Laterality: Left;  . TOTAL SHOULDER ARTHROPLASTY  2009   right    FAMILY HISTORY Family History  Problem Relation Age of Onset  . Cancer Mother 23       breast  . Breast cancer Mother   . Cancer Maternal Aunt        breast cancer at unknown age  . Breast cancer Maternal Aunt    the patient's father died at the age of 20 following a stroke in the setting of diabetes; the patient's mother is living at 59. The patient has 3 brothers and 2 sisters. [name], [name], was diagnosed with breast cancer in her early 69s. There is no other breast or ovarian cancer in the family. The patient herself was diagnosed with watts by history he is an incidentally found stage I colon carcinoma noted at the time of her hysterectomy. She had a partial colectomy but no adjuvant treatment. We have no records regarding that procedure  GYNECOLOGIC HISTORY:  No LMP recorded. Patient has had a hysterectomy. Menarche age 44, first live birth age 17, the patient underwent total abdominal hysterectomy with bilateral salpingo-oophorectomy in her 57s. She did not take hormone replacement. She did not take oral contraceptives at any point.  SOCIAL HISTORY:  [name] worked for [employer] for about 40 years, retiring in 2008. She is divorced and lives alone, with no pets. Her daughter [name] works for  [employer] in Press [employer]. The second daughter, [name], works as a Research scientist (physical sciences) for [employer]. The patient has 6 grandchildren and one great-grandchild. She attends a [employer].    ADVANCED DIRECTIVES: Not in place. The patient intends to name her daughter [name]. [name] is work number is [id number]. On the patient's 03/01/2014 visit she was given a copy of the appropriate documents to complete and notarize at her discretion   HEALTH MAINTENANCE: Social History   Tobacco Use  . Smoking status: Never Smoker  Substance Use Topics  . Alcohol use: Yes    Comment: occ-rare  . Drug use: No     Colonoscopy: 2000  PAP: May 2015  Bone density: 05/23/2004, at [facility] hospital; normal  Lipid panel:  No Known Allergies  Current Outpatient Medications  Medication Sig Dispense Refill  . acetaminophen (TYLENOL) 500 MG tablet Take 1,000 mg by mouth every 6 (six) hours as needed for mild pain or moderate pain.    Marland Kitchen gabapentin (NEURONTIN) 300 MG capsule TAKE 1 CAPSULE BY MOUTH AT BEDTIME 90 capsule 0  . LORazepam (ATIVAN) 1 MG tablet Take 15 minutes before MRI; may repeat once 5 tablet  0  . loteprednol (LOTEMAX) 0.5 % ophthalmic suspension Place 1 drop into both eyes 3 (three) times daily. Pt use this medication as needed.    . potassium chloride (K-DUR) 10 MEQ tablet Take 1 tablet (10 mEq total) by mouth 2 (two) times daily. 60 tablet 4   No current facility-administered medications for this visit.     OBJECTIVE: Morbidly obese African American woman who appears older than stated age  60:   11/26/17 1550  BP: 126/76  Pulse: 96  Resp: 18  Temp: 98.7 F (37.1 C)  SpO2: 100%     Body mass index is 43.53 kg/m.    ECOG FS:1 - Symptomatic but completely ambulatory  Sclerae unicteric, EOMs intact Oropharynx clear and moist No cervical or supraclavicular adenopathy Lungs no rales or rhonchi Heart regular rate and rhythm Abd soft,  nontender, positive bowel sounds MSK no focal spinal tenderness, no upper extremity lymphedema Neuro: nonfocal, well oriented, appropriate affect Breasts: Right breast is unremarkable.  The left breast is status post prior lumpectomy and radiation.  There is no evidence of local recurrence.  Both axillae are benign.  LAB RESULTS:  No results found for: SPEP  Lab Results  Component Value Date   WBC 5.3 11/26/2017   NEUTROABS 3.0 11/26/2017   HGB 12.4 11/26/2017   HCT 38.4 11/26/2017   MCV 91.9 11/26/2017   PLT 273 11/26/2017      Chemistry      Component Value Date/Time   NA 141 11/26/2017 1502   NA 142 11/13/2016 1050   K 4.3 11/26/2017 1502   K 4.1 11/13/2016 1050   CL 106 11/26/2017 1502   CO2 28 11/26/2017 1502   CO2 27 11/13/2016 1050   BUN 12 11/26/2017 1502   BUN 11.5 11/13/2016 1050   CREATININE 0.98 11/26/2017 1502   CREATININE 1.0 11/13/2016 1050      Component Value Date/Time   CALCIUM 9.7 11/26/2017 1502   CALCIUM 9.8 11/13/2016 1050   ALKPHOS 131 11/26/2017 1502   ALKPHOS 140 11/13/2016 1050   AST 11 11/26/2017 1502   AST 13 11/13/2016 1050   ALT 14 11/26/2017 1502   ALT 15 11/13/2016 1050   BILITOT 1.3 (H) 11/26/2017 1502   BILITOT 0.86 11/13/2016 1050       No results found for: LABCA2  No components found for: LABCA125  No results for input(s): INR in the last 168 hours.  Urinalysis    Component Value Date/Time   COLORURINE YELLOW 04/05/2008 1120   APPEARANCEUR CLEAR 04/05/2008 1120   LABSPEC 1.021 04/05/2008 1120   PHURINE 6.0 04/05/2008 1120   GLUCOSEU NEGATIVE 04/05/2008 1120   HGBUR NEGATIVE 04/05/2008 1120   BILIRUBINUR NEGATIVE 04/05/2008 1120   KETONESUR NEGATIVE 04/05/2008 1120   PROTEINUR NEGATIVE 04/05/2008 1120   UROBILINOGEN 0.2 04/05/2008 1120   NITRITE NEGATIVE 04/05/2008 1120   LEUKOCYTESUR  04/05/2008 1120    NEGATIVE MICROSCOPIC NOT DONE ON URINES WITH NEGATIVE PROTEIN, BLOOD, LEUKOCYTES, NITRITE, OR GLUCOSE <1000  mg/dL.    STUDIES: US Breast Ltd Uni Right Inc Axilla  Result Date: 11/05/2017 CLINICAL DATA:  Left lumpectomy.  Annual mammography. EXAM: DIGITAL DIAGNOSTIC BILATERAL MAMMOGRAM WITH CAD AND TOMO ULTRASOUND RIGHT BREAST COMPARISON:  Previous exam(s). ACR Breast Density Category b: There are scattered areas of fibroglandular density. FINDINGS: The left lumpectomy site is stable. Nodular asymmetries in the lateral central right breast are identified. Many of these asymmetries resolve on additional imaging but not all. As a result, the  patient was taken to ultrasound. Mammographic images were processed with CAD. On physical exam, no suspicious lumps are identified. Targeted ultrasound is performed, showing a cyst in the right breast at 9 o'clock correlating with 1 of the asymmetries. In the adjacent tissue at 9 o'clock, 1 cm from the nipple there is a solid mass measuring 8 x 3 by 9 mm with internal blood flow. No axillary adenopathy. IMPRESSION: There is a solid mass in the lateral right breast at 9 o'clock correlating with mammographic findings. RECOMMENDATION: Recommend ultrasound-guided biopsy of the right breast solid mass at 9 o'clock. I have discussed the findings and recommendations with the patient. Results were also provided in writing at the conclusion of the visit. If applicable, a reminder letter will be sent to the patient regarding the next appointment. BI-RADS CATEGORY  4: Suspicious. Electronically Signed   By: Dorise Bullion III M.D   On: 11/05/2017 10:36   Mm Diag Breast Tomo Bilateral  Result Date: 11/05/2017 CLINICAL DATA:  Left lumpectomy.  Annual mammography. EXAM: DIGITAL DIAGNOSTIC BILATERAL MAMMOGRAM WITH CAD AND TOMO ULTRASOUND RIGHT BREAST COMPARISON:  Previous exam(s). ACR Breast Density Category b: There are scattered areas of fibroglandular density. FINDINGS: The left lumpectomy site is stable. Nodular asymmetries in the lateral central right breast are identified. Many of  these asymmetries resolve on additional imaging but not all. As a result, the patient was taken to ultrasound. Mammographic images were processed with CAD. On physical exam, no suspicious lumps are identified. Targeted ultrasound is performed, showing a cyst in the right breast at 9 o'clock correlating with 1 of the asymmetries. In the adjacent tissue at 9 o'clock, 1 cm from the nipple there is a solid mass measuring 8 x 3 by 9 mm with internal blood flow. No axillary adenopathy. IMPRESSION: There is a solid mass in the lateral right breast at 9 o'clock correlating with mammographic findings. RECOMMENDATION: Recommend ultrasound-guided biopsy of the right breast solid mass at 9 o'clock. I have discussed the findings and recommendations with the patient. Results were also provided in writing at the conclusion of the visit. If applicable, a reminder letter will be sent to the patient regarding the next appointment. BI-RADS CATEGORY  4: Suspicious. Electronically Signed   By: Dorise Bullion III M.D   On: 11/05/2017 10:36   Mm Clip Placement Right  Result Date: 11/06/2017 CLINICAL DATA:  Evaluate clip placement following ultrasound-guided RIGHT breast biopsy. EXAM: DIAGNOSTIC RIGHT MAMMOGRAM POST ULTRASOUND BIOPSY COMPARISON:  Previous exam(s). FINDINGS: Mammographic images were obtained following ultrasound guided biopsy of 9 mm mass at the 9 o'clock position of the RIGHT breast. The RIBBON shaped clip is in satisfactory position. Minimal post biopsy hemorrhage is noted. IMPRESSION: Satisfactory RIBBON clip placement following ultrasound-guided RIGHT breast biopsy. Final Assessment: Post Procedure Mammograms for Marker Placement Electronically Signed   By: Margarette Canada M.D.   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  This was found to be concordant by Dr. Hassan Rowan, with excision recommended. Pathology results were discussed with the patient by telephone. The patient reported doing well after the biopsy with tenderness at the site. Post biopsy instructions and care were reviewed and questions were answered. The patient was encouraged to call [facility] for any additional concerns. Surgical consultation has  been arranged with Dr. Rolm Bookbinder at [facility] on December 01, 2017. Pathology results reported by Roselind Messier, RN on 11/09/2017. Electronically Signed   By: Margarette Canada M.D.   On: 11/09/2017 11:48   Result Date: 11/11/2017 CLINICAL DATA:  67 year old female for tissue sampling of OUTER RIGHT breast mass. History of LEFT breast cancer. EXAM: ULTRASOUND GUIDED RIGHT BREAST CORE NEEDLE BIOPSY COMPARISON:  Previous exam(s). FINDINGS: I met with the patient and we discussed the procedure of ultrasound-guided biopsy, including benefits and alternatives. We discussed the high likelihood of a successful procedure. We discussed the risks of the procedure, including infection, bleeding, tissue injury, clip migration, and inadequate sampling. Informed written consent was given. The usual time-out protocol was performed immediately prior to the procedure. Using sterile technique and 1% Lidocaine as local anesthetic, under direct ultrasound visualization, a 12 gauge spring-loaded device was used to perform biopsy of the 9 mm hypoechoic mass at the 9 o'clock position of the RIGHT breast 1 cm from the nipple using a LATERAL approach. At the conclusion of the procedure a RIBBON shaped tissue marker clip was deployed into the biopsy cavity. Follow up 2 view mammogram was performed and dictated separately. IMPRESSION: Ultrasound guided biopsy of 9 mm OUTER RIGHT breast mass. No apparent complications. Electronically Signed: By: Margarette Canada M.D. On: 11/06/2017 10:14    ASSESSMENT: 67 y.o. BRCA  negative [place] woman s/p Left breast upper outer quadrant and left axillary lymph node biopsy 02/22/2014, both positive for a clinical T2 N2, stage IIIA invasive ductal carcinoma, grade 3, triple negative, with an MIB-1 of 90%  (1) neoadjuvant chemotherapy started 03/13/2014, with cyclophosphamide and doxorubicin in dose dense fashion x4, with Neulasta support on day 2, completed 04/24/2014, to be followed by weekly carboplatin and paclitaxel x12, paclitaxel reduced during cycle 5 and cycle 7 because of elevated bilirubin levels  (2) left lumpectomy and sentinel lymph node sampling 09/20/2014 showed a complete pathologic response.  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  (b) PALB2 mutations are known to increase the risk of breast and pancreatic cancer, possibly other cancers, but the data is preliminary  (c) the patient's 2 daughters have been tested and did not carry the gene  (d) Ayza's MSH2 VUS has been reclassified as "likely benign"  (e) she is status post remote TAH/BSO  (f) patient initially stated she was unable to tolerate breast MRI, we considered after March 2019 visit  (6) staging studies showed right-sided lung nodules, too small to characterize, and a dominant right-sided thyroid nodule unchanged in size as compared to 2011; to be followed  (a) chest CT 04/24/2016 showed the small pulmonary nodules to be completely stable, consistent with benign findings.  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  This is very favorable.  She is appropriately concerned regarding the new development in her breast.  She understands papillomas are benign, but nevertheless they require intervention.    In the past she has told me she would not be able to tolerate any more MRIs but at this point she  is willing to undergo them. Accordingly I set her up for breast MRI in September, roughly 6 months after her mammogram, and she will see me in October.  She then can have her mammography again next February or March.  We will continue intensified screening in that form indefinitely.  At some point she will need follow-up of the thyroid nodule previously noted  She knows to call for any problems that may develop before her next visit here.      Sherby Moncayo, Virgie Dad, MD  11/26/17 4:24 PM Medical Oncology and Hematology [facility] [address] [place], [place] [address] Tel. [phone]    Fax. [phone]  This document serves as a record of services personally performed by Lurline Del, MD. It was created on his behalf by Sheron Nightingale, a trained medical scribe. The creation of this record is based on the scribe's personal observations and the provider's statements to them.   I have reviewed the above documentation for accuracy and completeness, and I agree with the above.

## 2017-11-26 ENCOUNTER — Inpatient Hospital Stay: Payer: Medicare Other

## 2017-11-26 ENCOUNTER — Inpatient Hospital Stay: Payer: Medicare Other | Attending: Oncology | Admitting: Oncology

## 2017-11-26 VITALS — BP 126/76 | HR 96 | Temp 98.7°F | Resp 18 | Ht 62.0 in | Wt 238.0 lb

## 2017-11-26 DIAGNOSIS — Z1509 Genetic susceptibility to other malignant neoplasm: Secondary | ICD-10-CM

## 2017-11-26 DIAGNOSIS — C773 Secondary and unspecified malignant neoplasm of axilla and upper limb lymph nodes: Secondary | ICD-10-CM | POA: Diagnosis not present

## 2017-11-26 DIAGNOSIS — Z1589 Genetic susceptibility to other disease: Secondary | ICD-10-CM

## 2017-11-26 DIAGNOSIS — Z1502 Genetic susceptibility to malignant neoplasm of ovary: Secondary | ICD-10-CM

## 2017-11-26 DIAGNOSIS — C50919 Malignant neoplasm of unspecified site of unspecified female breast: Secondary | ICD-10-CM

## 2017-11-26 DIAGNOSIS — C50412 Malignant neoplasm of upper-outer quadrant of left female breast: Secondary | ICD-10-CM | POA: Diagnosis not present

## 2017-11-26 DIAGNOSIS — Z171 Estrogen receptor negative status [ER-]: Secondary | ICD-10-CM

## 2017-11-26 DIAGNOSIS — C189 Malignant neoplasm of colon, unspecified: Secondary | ICD-10-CM

## 2017-11-26 LAB — CBC WITH DIFFERENTIAL/PLATELET
Basophils Absolute: 0 10*3/uL (ref 0.0–0.1)
Basophils Relative: 0 %
EOS ABS: 0.1 10*3/uL (ref 0.0–0.5)
EOS PCT: 3 %
HEMATOCRIT: 38.4 % (ref 34.8–46.6)
Hemoglobin: 12.4 g/dL (ref 11.6–15.9)
LYMPHS ABS: 1.7 10*3/uL (ref 0.9–3.3)
Lymphocytes Relative: 33 %
MCH: 29.7 pg (ref 25.1–34.0)
MCHC: 32.3 g/dL (ref 31.5–36.0)
MCV: 91.9 fL (ref 79.5–101.0)
Monocytes Absolute: 0.4 10*3/uL (ref 0.1–0.9)
Monocytes Relative: 7 %
NEUTROS PCT: 57 %
Neutro Abs: 3 10*3/uL (ref 1.5–6.5)
Platelets: 273 10*3/uL (ref 145–400)
RBC: 4.18 MIL/uL (ref 3.70–5.45)
RDW: 13 % (ref 11.2–14.5)
WBC: 5.3 10*3/uL (ref 3.9–10.3)

## 2017-11-26 LAB — COMPREHENSIVE METABOLIC PANEL
ALK PHOS: 131 U/L (ref 40–150)
ALT: 14 U/L (ref 0–55)
AST: 11 U/L (ref 5–34)
Albumin: 3.3 g/dL — ABNORMAL LOW (ref 3.5–5.0)
Anion gap: 7 (ref 3–11)
BUN: 12 mg/dL (ref 7–26)
CALCIUM: 9.7 mg/dL (ref 8.4–10.4)
CHLORIDE: 106 mmol/L (ref 98–109)
CO2: 28 mmol/L (ref 22–29)
CREATININE: 0.98 mg/dL (ref 0.60–1.10)
GFR, EST NON AFRICAN AMERICAN: 59 mL/min — AB (ref >60)
Glucose, Bld: 110 mg/dL (ref 70–140)
Potassium: 4.3 mmol/L (ref 3.5–5.1)
Sodium: 141 mmol/L (ref 136–145)
TOTAL PROTEIN: 7.3 g/dL (ref 6.4–8.3)
Total Bilirubin: 1.3 mg/dL — ABNORMAL HIGH (ref 0.2–1.2)

## 2017-11-26 MED ORDER — LORAZEPAM 1 MG PO TABS: ORAL_TABLET | ORAL | 0 refills | Status: DC

## 2017-12-01 DIAGNOSIS — D241 Benign neoplasm of right breast: Secondary | ICD-10-CM | POA: Diagnosis not present

## 2017-12-02 ENCOUNTER — Other Ambulatory Visit: Payer: Self-pay | Admitting: General Surgery

## 2017-12-02 DIAGNOSIS — N6310 Unspecified lump in the right breast, unspecified quadrant: Secondary | ICD-10-CM

## 2017-12-02 DIAGNOSIS — D241 Benign neoplasm of right breast: Secondary | ICD-10-CM | POA: Diagnosis not present

## 2017-12-04 ENCOUNTER — Other Ambulatory Visit: Payer: Self-pay | Admitting: General Surgery

## 2017-12-04 DIAGNOSIS — N6310 Unspecified lump in the right breast, unspecified quadrant: Secondary | ICD-10-CM

## 2017-12-15 ENCOUNTER — Encounter (HOSPITAL_BASED_OUTPATIENT_CLINIC_OR_DEPARTMENT_OTHER): Payer: Self-pay | Admitting: *Deleted

## 2017-12-18 ENCOUNTER — Ambulatory Visit
Admission: RE | Admit: 2017-12-18 | Discharge: 2017-12-18 | Disposition: A | Discharge status: Home / Self Care | Payer: Medicare Other | Source: Ambulatory Visit | Attending: General Surgery | Admitting: General Surgery

## 2017-12-18 DIAGNOSIS — N6310 Unspecified lump in the right breast, unspecified quadrant: Secondary | ICD-10-CM

## 2017-12-18 NOTE — Progress Notes (Signed)
Ensure pre surgery drink given with instructions to complete by 0515 dos, pt verbalized understanding. 

## 2017-12-21 ENCOUNTER — Ambulatory Visit (HOSPITAL_BASED_OUTPATIENT_CLINIC_OR_DEPARTMENT_OTHER): Payer: Medicare Other | Admitting: Anesthesiology

## 2017-12-21 ENCOUNTER — Encounter (HOSPITAL_BASED_OUTPATIENT_CLINIC_OR_DEPARTMENT_OTHER)
Admission: RE | Disposition: A | Discharge status: Home / Self Care | Payer: Self-pay | Source: Ambulatory Visit | Attending: General Surgery

## 2017-12-21 ENCOUNTER — Ambulatory Visit
Admission: RE | Admit: 2017-12-21 | Discharge: 2017-12-21 | Disposition: A | Discharge status: Home / Self Care | Payer: Medicare Other | Source: Ambulatory Visit | Attending: General Surgery | Admitting: General Surgery

## 2017-12-21 ENCOUNTER — Encounter (HOSPITAL_BASED_OUTPATIENT_CLINIC_OR_DEPARTMENT_OTHER): Payer: Self-pay | Admitting: Anesthesiology

## 2017-12-21 ENCOUNTER — Ambulatory Visit (HOSPITAL_BASED_OUTPATIENT_CLINIC_OR_DEPARTMENT_OTHER)
Admission: RE | Admit: 2017-12-21 | Discharge: 2017-12-21 | Disposition: A | Discharge status: Home / Self Care | Payer: Medicare Other | Source: Ambulatory Visit | Attending: General Surgery | Admitting: General Surgery

## 2017-12-21 ENCOUNTER — Other Ambulatory Visit: Payer: Self-pay

## 2017-12-21 DIAGNOSIS — D241 Benign neoplasm of right breast: Secondary | ICD-10-CM | POA: Diagnosis not present

## 2017-12-21 DIAGNOSIS — N6489 Other specified disorders of breast: Secondary | ICD-10-CM | POA: Diagnosis not present

## 2017-12-21 DIAGNOSIS — Z85038 Personal history of other malignant neoplasm of large intestine: Secondary | ICD-10-CM | POA: Insufficient documentation

## 2017-12-21 DIAGNOSIS — Z9049 Acquired absence of other specified parts of digestive tract: Secondary | ICD-10-CM | POA: Diagnosis not present

## 2017-12-21 DIAGNOSIS — N6310 Unspecified lump in the right breast, unspecified quadrant: Secondary | ICD-10-CM | POA: Diagnosis present

## 2017-12-21 DIAGNOSIS — N6091 Unspecified benign mammary dysplasia of right breast: Secondary | ICD-10-CM | POA: Diagnosis not present

## 2017-12-21 DIAGNOSIS — Z6841 Body Mass Index (BMI) 40.0 and over, adult: Secondary | ICD-10-CM | POA: Diagnosis not present

## 2017-12-21 DIAGNOSIS — Z923 Personal history of irradiation: Secondary | ICD-10-CM | POA: Diagnosis not present

## 2017-12-21 DIAGNOSIS — N63 Unspecified lump in unspecified breast: Secondary | ICD-10-CM | POA: Diagnosis not present

## 2017-12-21 DIAGNOSIS — Z9221 Personal history of antineoplastic chemotherapy: Secondary | ICD-10-CM | POA: Insufficient documentation

## 2017-12-21 DIAGNOSIS — Z803 Family history of malignant neoplasm of breast: Secondary | ICD-10-CM | POA: Diagnosis not present

## 2017-12-21 HISTORY — PX: BREAST EXCISIONAL BIOPSY: SUR124

## 2017-12-21 HISTORY — PX: RADIOACTIVE SEED GUIDED EXCISIONAL BREAST BIOPSY: SHX6490

## 2017-12-21 SURGERY — RADIOACTIVE SEED GUIDED BREAST BIOPSY
Anesthesia: General | Site: Breast | Laterality: Right

## 2017-12-21 MED ORDER — PROPOFOL 10 MG/ML IV BOLUS
INTRAVENOUS | Status: AC
  Filled 2017-12-21: qty 20

## 2017-12-21 MED ORDER — FENTANYL CITRATE (PF) 100 MCG/2ML IJ SOLN: 50.0000 ug | INTRAMUSCULAR | Status: DC | PRN

## 2017-12-21 MED ORDER — LIDOCAINE HCL (CARDIAC) 20 MG/ML IV SOLN
INTRAVENOUS | Status: DC | PRN
  Administered 2017-12-21: 80 mg via INTRAVENOUS

## 2017-12-21 MED ORDER — MIDAZOLAM HCL 5 MG/5ML IJ SOLN
INTRAMUSCULAR | Status: DC | PRN
  Administered 2017-12-21: 2 mg via INTRAVENOUS

## 2017-12-21 MED ORDER — CEFAZOLIN SODIUM-DEXTROSE 2-4 GM/100ML-% IV SOLN
2.0000 g | INTRAVENOUS | Status: AC
  Administered 2017-12-21: 2 g via INTRAVENOUS

## 2017-12-21 MED ORDER — ONDANSETRON HCL 4 MG/2ML IJ SOLN
INTRAMUSCULAR | Status: AC
  Filled 2017-12-21: qty 2

## 2017-12-21 MED ORDER — EPHEDRINE 5 MG/ML INJ
INTRAVENOUS | Status: AC
  Filled 2017-12-21: qty 10

## 2017-12-21 MED ORDER — ACETAMINOPHEN 500 MG PO TABS
ORAL_TABLET | ORAL | Status: AC
  Filled 2017-12-21: qty 2

## 2017-12-21 MED ORDER — MIDAZOLAM HCL 2 MG/2ML IJ SOLN
INTRAMUSCULAR | Status: AC
  Filled 2017-12-21: qty 2

## 2017-12-21 MED ORDER — TRAMADOL HCL 50 MG PO TABS: 50.0000 mg | ORAL_TABLET | Freq: Four times a day (QID) | ORAL | 0 refills | Status: DC | PRN

## 2017-12-21 MED ORDER — ENSURE PRE-SURGERY PO LIQD: 296.0000 mL | Freq: Once | ORAL | Status: DC

## 2017-12-21 MED ORDER — CEFAZOLIN SODIUM-DEXTROSE 2-4 GM/100ML-% IV SOLN
INTRAVENOUS | Status: AC
  Filled 2017-12-21: qty 100

## 2017-12-21 MED ORDER — BUPIVACAINE HCL (PF) 0.25 % IJ SOLN
INTRAMUSCULAR | Status: AC
  Filled 2017-12-21: qty 30

## 2017-12-21 MED ORDER — FENTANYL CITRATE (PF) 100 MCG/2ML IJ SOLN
INTRAMUSCULAR | Status: AC
  Filled 2017-12-21: qty 2

## 2017-12-21 MED ORDER — MIDAZOLAM HCL 2 MG/2ML IJ SOLN: 1.0000 mg | INTRAMUSCULAR | Status: DC | PRN

## 2017-12-21 MED ORDER — GABAPENTIN 300 MG PO CAPS
ORAL_CAPSULE | ORAL | Status: AC
  Filled 2017-12-21: qty 1

## 2017-12-21 MED ORDER — GABAPENTIN 300 MG PO CAPS
300.0000 mg | ORAL_CAPSULE | ORAL | Status: AC
  Administered 2017-12-21: 300 mg via ORAL

## 2017-12-21 MED ORDER — PHENYLEPHRINE 40 MCG/ML (10ML) SYRINGE FOR IV PUSH (FOR BLOOD PRESSURE SUPPORT)
PREFILLED_SYRINGE | INTRAVENOUS | Status: AC
  Filled 2017-12-21: qty 10

## 2017-12-21 MED ORDER — DEXAMETHASONE SODIUM PHOSPHATE 10 MG/ML IJ SOLN
INTRAMUSCULAR | Status: AC
Start: 2017-12-21 — End: ?
  Filled 2017-12-21: qty 1

## 2017-12-21 MED ORDER — LACTATED RINGERS IV SOLN
INTRAVENOUS | Status: DC | PRN
  Administered 2017-12-21: 09:00:00 via INTRAVENOUS

## 2017-12-21 MED ORDER — LIDOCAINE HCL (CARDIAC) 20 MG/ML IV SOLN
INTRAVENOUS | Status: AC
  Filled 2017-12-21: qty 5

## 2017-12-21 MED ORDER — FENTANYL CITRATE (PF) 100 MCG/2ML IJ SOLN
INTRAMUSCULAR | Status: DC | PRN
  Administered 2017-12-21: 100 ug via INTRAVENOUS

## 2017-12-21 MED ORDER — FENTANYL CITRATE (PF) 100 MCG/2ML IJ SOLN: 25.0000 ug | INTRAMUSCULAR | Status: DC | PRN

## 2017-12-21 MED ORDER — ONDANSETRON HCL 4 MG/2ML IJ SOLN
INTRAMUSCULAR | Status: DC | PRN
  Administered 2017-12-21: 4 mg via INTRAVENOUS

## 2017-12-21 MED ORDER — SCOPOLAMINE 1 MG/3DAYS TD PT72: 1.0000 | MEDICATED_PATCH | Freq: Once | TRANSDERMAL | Status: DC | PRN

## 2017-12-21 MED ORDER — LACTATED RINGERS IV SOLN: INTRAVENOUS | Status: DC

## 2017-12-21 MED ORDER — BUPIVACAINE HCL (PF) 0.25 % IJ SOLN
INTRAMUSCULAR | Status: DC | PRN
  Administered 2017-12-21: 10 mL

## 2017-12-21 MED ORDER — ACETAMINOPHEN 500 MG PO TABS
1000.0000 mg | ORAL_TABLET | ORAL | Status: AC
  Administered 2017-12-21: 1000 mg via ORAL

## 2017-12-21 MED ORDER — SODIUM CHLORIDE 0.9 % IV SOLN
INTRAVENOUS | Status: DC | PRN
  Administered 2017-12-21: 80 ug via INTRAVENOUS

## 2017-12-21 MED ORDER — ONDANSETRON HCL 4 MG/2ML IJ SOLN: 4.0000 mg | Freq: Once | INTRAMUSCULAR | Status: DC | PRN

## 2017-12-21 MED ORDER — DEXAMETHASONE SODIUM PHOSPHATE 4 MG/ML IJ SOLN
INTRAMUSCULAR | Status: DC | PRN
  Administered 2017-12-21: 10 mg via INTRAVENOUS

## 2017-12-21 MED ORDER — PROPOFOL 10 MG/ML IV BOLUS
INTRAVENOUS | Status: DC | PRN
  Administered 2017-12-21: 150 mg via INTRAVENOUS

## 2017-12-21 MED FILL — traMADol HCL 50 MG TABS: 50 | 2 days supply | Qty: 10 | Fill #0

## 2017-12-21 SURGICAL SUPPLY — 61 items
ADH SKN CLS APL DERMABOND .7 (GAUZE/BANDAGES/DRESSINGS) ×1
APPLIER CLIP 9.375 MED OPEN (MISCELLANEOUS)
APR CLP MED 9.3 20 MLT OPN (MISCELLANEOUS)
BINDER BREAST LRG (GAUZE/BANDAGES/DRESSINGS) IMPLANT
BINDER BREAST MEDIUM (GAUZE/BANDAGES/DRESSINGS) IMPLANT
BINDER BREAST XLRG (GAUZE/BANDAGES/DRESSINGS) IMPLANT
BINDER BREAST XXLRG (GAUZE/BANDAGES/DRESSINGS) ×2 IMPLANT
BLADE SURG 15 STRL LF DISP TIS (BLADE) ×1 IMPLANT
BLADE SURG 15 STRL SS (BLADE) ×3
CANISTER SUC SOCK COL 7IN (MISCELLANEOUS) IMPLANT
CANISTER SUCT 1200ML W/VALVE (MISCELLANEOUS) IMPLANT
CHLORAPREP W/TINT 26ML (MISCELLANEOUS) ×3 IMPLANT
CLIP APPLIE 9.375 MED OPEN (MISCELLANEOUS) IMPLANT
CLIP VESOCCLUDE SM WIDE 6/CT (CLIP) IMPLANT
CLOSURE WOUND 1/2 X4 (GAUZE/BANDAGES/DRESSINGS) ×1
COVER BACK TABLE 60X90IN (DRAPES) ×3 IMPLANT
COVER MAYO STAND STRL (DRAPES) ×3 IMPLANT
COVER PROBE W GEL 5X96 (DRAPES) ×3 IMPLANT
DECANTER SPIKE VIAL GLASS SM (MISCELLANEOUS) IMPLANT
DERMABOND ADVANCED (GAUZE/BANDAGES/DRESSINGS) ×2
DERMABOND ADVANCED .7 DNX12 (GAUZE/BANDAGES/DRESSINGS) ×1 IMPLANT
DEVICE DUBIN W/COMP PLATE 8390 (MISCELLANEOUS) ×3 IMPLANT
DRAPE LAPAROSCOPIC ABDOMINAL (DRAPES) ×3 IMPLANT
DRAPE UTILITY XL STRL (DRAPES) ×3 IMPLANT
DRSG TEGADERM 4X4.75 (GAUZE/BANDAGES/DRESSINGS) IMPLANT
ELECT COATED BLADE 2.86 ST (ELECTRODE) ×3 IMPLANT
ELECT REM PT RETURN 9FT ADLT (ELECTROSURGICAL) ×3
ELECTRODE REM PT RTRN 9FT ADLT (ELECTROSURGICAL) ×1 IMPLANT
GAUZE SPONGE 4X4 12PLY STRL LF (GAUZE/BANDAGES/DRESSINGS) IMPLANT
GLOVE BIO SURGEON STRL SZ7 (GLOVE) ×8 IMPLANT
GLOVE BIOGEL PI IND STRL 7.0 (GLOVE) IMPLANT
GLOVE BIOGEL PI IND STRL 7.5 (GLOVE) ×1 IMPLANT
GLOVE BIOGEL PI INDICATOR 7.0 (GLOVE) ×2
GLOVE BIOGEL PI INDICATOR 7.5 (GLOVE) ×4
GOWN STRL REUS W/ TWL LRG LVL3 (GOWN DISPOSABLE) ×2 IMPLANT
GOWN STRL REUS W/TWL LRG LVL3 (GOWN DISPOSABLE) ×6
HEMOSTAT ARISTA ABSORB 3G PWDR (MISCELLANEOUS) IMPLANT
ILLUMINATOR WAVEGUIDE N/F (MISCELLANEOUS) IMPLANT
KIT MARKER MARGIN INK (KITS) ×3 IMPLANT
LIGHT WAVEGUIDE WIDE FLAT (MISCELLANEOUS) IMPLANT
NDL HYPO 25X1 1.5 SAFETY (NEEDLE) ×1 IMPLANT
NEEDLE HYPO 25X1 1.5 SAFETY (NEEDLE) ×3 IMPLANT
NS IRRIG 1000ML POUR BTL (IV SOLUTION) ×2 IMPLANT
PACK BASIN DAY SURGERY FS (CUSTOM PROCEDURE TRAY) ×3 IMPLANT
PENCIL BUTTON HOLSTER BLD 10FT (ELECTRODE) ×3 IMPLANT
SLEEVE SCD COMPRESS KNEE MED (MISCELLANEOUS) ×3 IMPLANT
SPONGE LAP 4X18 X RAY DECT (DISPOSABLE) ×3 IMPLANT
STRIP CLOSURE SKIN 1/2X4 (GAUZE/BANDAGES/DRESSINGS) ×2 IMPLANT
SUT MNCRL AB 4-0 PS2 18 (SUTURE) IMPLANT
SUT MON AB 5-0 PS2 18 (SUTURE) ×2 IMPLANT
SUT SILK 2 0 SH (SUTURE) IMPLANT
SUT VIC AB 2-0 SH 27 (SUTURE) ×3
SUT VIC AB 2-0 SH 27XBRD (SUTURE) ×1 IMPLANT
SUT VIC AB 3-0 SH 27 (SUTURE) ×3
SUT VIC AB 3-0 SH 27X BRD (SUTURE) ×1 IMPLANT
SYR CONTROL 10ML LL (SYRINGE) ×3 IMPLANT
TOWEL OR 17X24 6PK STRL BLUE (TOWEL DISPOSABLE) ×3 IMPLANT
TOWEL OR NON WOVEN STRL DISP B (DISPOSABLE) ×3 IMPLANT
TUBE CONNECTING 20'X1/4 (TUBING)
TUBE CONNECTING 20X1/4 (TUBING) IMPLANT
YANKAUER SUCT BULB TIP NO VENT (SUCTIONS) IMPLANT

## 2017-12-21 NOTE — Anesthesia Preprocedure Evaluation (Addendum)
Anesthesia Evaluation  Patient identified by MRN, date of birth, ID band Patient awake    Reviewed: Allergy & Precautions, NPO status , Patient's Chart, lab work & pertinent test results  Airway Mallampati: II  TM Distance: >3 FB Neck ROM: Full    Dental  (+) Teeth Intact, Dental Advisory Given   Pulmonary neg pulmonary ROS,    Pulmonary exam normal breath sounds clear to auscultation       Cardiovascular Exercise Tolerance: Good negative cardio ROS Normal cardiovascular exam Rhythm:Regular Rate:Normal     Neuro/Psych negative neurological ROS     GI/Hepatic negative GI ROS, Neg liver ROS, Colon cancer   Endo/Other  Morbid obesity  Renal/GU negative Renal ROS     Musculoskeletal negative musculoskeletal ROS (+)   Abdominal   Peds  Hematology negative hematology ROS (+)   Anesthesia Other Findings Day of surgery medications reviewed with the patient.  Left breast cancer s/p chemo/radiation  Reproductive/Obstetrics                            Anesthesia Physical Anesthesia Plan  ASA: III  Anesthesia Plan: General   Post-op Pain Management:    Induction: Intravenous  PONV Risk Score and Plan: 3 and Midazolam, Dexamethasone and Ondansetron  Airway Management Planned: LMA  Additional Equipment:   Intra-op Plan:   Post-operative Plan: Extubation in OR  Informed Consent: I have reviewed the patients History and Physical, chart, labs and discussed the procedure including the risks, benefits and alternatives for the proposed anesthesia with the patient or authorized representative who has indicated his/her understanding and acceptance.   Dental advisory given  Plan Discussed with: CRNA  Anesthesia Plan Comments:         Anesthesia Quick Evaluation

## 2017-12-21 NOTE — Anesthesia Procedure Notes (Signed)
Procedure Name: LMA Insertion Date/Time: 12/21/2017 8:41 AM Performed by: Marrianne Mood, CRNA Pre-anesthesia Checklist: Patient identified, Emergency Drugs available, Suction available, Patient being monitored and Timeout performed Patient Re-evaluated:Patient Re-evaluated prior to induction Oxygen Delivery Method: Circle system utilized Preoxygenation: Pre-oxygenation with 100% oxygen Induction Type: IV induction Ventilation: Mask ventilation without difficulty LMA: LMA inserted LMA Size: 4.0 Number of attempts: 1 Airway Equipment and Method: Bite block Placement Confirmation: positive ETCO2 Tube secured with: Tape Dental Injury: Teeth and Oropharynx as per pre-operative assessment

## 2017-12-21 NOTE — Transfer of Care (Signed)
Immediate Anesthesia Transfer of Care Note  Patient: [name]  Procedure(s) Performed: RIGHT RADIOACTIVE SEED GUIDED EXCISIONAL BREAST BIOPSY ERAS PATHWAY (Right Breast)  Patient Location: PACU  Anesthesia Type:General  Level of Consciousness: awake  Airway & Oxygen Therapy: Patient Spontanous Breathing and Patient connected to face mask oxygen  Post-op Assessment: Report given to RN and Post -op Vital signs reviewed and stable  Post vital signs: Reviewed and stable  Last Vitals:  Vitals Value Taken Time  BP 140/89 12/21/2017  9:18 AM  Temp    Pulse 89 12/21/2017  9:20 AM  Resp 10 12/21/2017  9:20 AM  SpO2 100 % 12/21/2017  9:20 AM  Vitals shown include unvalidated device data.  Last Pain:  Vitals:   12/21/17 4734  TempSrc: Oral  PainSc: 0-No pain      Patients Stated Pain Goal: 0 (03/70/96 4383)  Complications: No apparent anesthesia complications

## 2017-12-21 NOTE — Interval H&P Note (Signed)
History and Physical Interval Note:  12/21/2017 8:09 AM  [name]  has presented today for surgery, with the diagnosis of right breast mass  The various methods of treatment have been discussed with the patient and family. After consideration of risks, benefits and other options for treatment, the patient has consented to  Procedure(s): RIGHT RADIOACTIVE SEED GUIDED EXCISIONAL BREAST BIOPSY ERAS PATHWAY (Right) as a surgical intervention .  The patient's history has been reviewed, patient examined, no change in status, stable for surgery.  I have reviewed the patient's chart and labs.  Questions were answered to the patient's satisfaction.     Rolm Bookbinder

## 2017-12-21 NOTE — Discharge Instructions (Signed)
[facility] °Office Phone Number [phone] ° °BREAST BIOPSY/ PARTIAL MASTECTOMY: POST OP INSTRUCTIONS ° °Always review your discharge instruction sheet given to you by the facility where your surgery was performed. ° °IF YOU HAVE DISABILITY OR FAMILY LEAVE FORMS, YOU MUST BRING THEM TO THE OFFICE FOR PROCESSING.  DO NOT GIVE THEM TO YOUR DOCTOR. ° °1. A prescription for pain medication may be given to you upon discharge.  Take your pain medication as prescribed, if needed.  If narcotic pain medicine is not needed, then you may take acetaminophen (Tylenol), naprosyn (Alleve) or ibuprofen (Advil) as needed. °2. Take your usually prescribed medications unless otherwise directed °3. If you need a refill on your pain medication, please contact your pharmacy.  They will contact our office to request authorization.  Prescriptions will not be filled after 5pm or on week-ends. °4. You should eat very light the first 24 hours after surgery, such as soup, crackers, pudding, etc.  Resume your normal diet the day after surgery. °5. Most patients will experience some swelling and bruising in the breast.  Ice packs and a good support bra will help.  Wear the breast binder provided or a sports bra for 72 hours day and night.  After that wear a sports bra during the day until you return to the office. Swelling and bruising can take several days to resolve.  °6. It is common to experience some constipation if taking pain medication after surgery.  Increasing fluid intake and taking a stool softener will usually help or prevent this problem from occurring.  A mild laxative (Milk of Magnesia or Miralax) should be taken according to package directions if there are no bowel movements after 48 hours. °7. Unless discharge instructions indicate otherwise, you may remove your bandages 48 hours after surgery and you may shower at that time.  You may have steri-strips (small skin tapes) in place directly over the incision.   These strips should be left on the skin for 7-10 days and will come off on their own.  If your surgeon used skin glue on the incision, you may shower in 24 hours.  The glue will flake off over the next 2-3 weeks.  Any sutures or staples will be removed at the office during your follow-up visit. °8. ACTIVITIES:  You may resume regular daily activities (gradually increasing) beginning the next day.  Wearing a good support bra or sports bra minimizes pain and swelling.  You may have sexual intercourse when it is comfortable. °a. You may drive when you no longer are taking prescription pain medication, you can comfortably wear a seatbelt, and you can safely maneuver your car and apply brakes. °b. RETURN TO WORK:  ______________________________________________________________________________________ °9. You should see your doctor in the office for a follow-up appointment approximately two weeks after your surgery.  Your doctor’s nurse will typically make your follow-up appointment when she calls you with your pathology report.  Expect your pathology report 3-4 business days after your surgery.  You may call to check if you do not hear from us after three days. °10. OTHER INSTRUCTIONS: _______________________________________________________________________________________________ _____________________________________________________________________________________________________________________________________ °_____________________________________________________________________________________________________________________________________ °_____________________________________________________________________________________________________________________________________ ° °WHEN TO CALL DR WAKEFIELD: °1. Fever over 101.0 °2. Nausea and/or vomiting. °3. Extreme swelling or bruising. °4. Continued bleeding from incision. °5. Increased pain, redness, or drainage from the incision. ° °The clinic staff is available to  answer your questions during regular business hours.  Please don’t hesitate to call and ask to speak to one of the nurses for   clinical concerns.  If you have a medical emergency, go to the nearest emergency room or call 911.  A surgeon from [facility] is always on call at the hospital.  For further questions, please visit [email or web address]        Post Anesthesia Home Care Instructions  Activity: Get plenty of rest for the remainder of the day. A responsible individual must stay with you for 24 hours following the procedure.  For the next 24 hours, DO NOT: -Drive a car -Paediatric nurse -Drink alcoholic beverages -Take any medication unless instructed by your physician -Make any legal decisions or sign important papers.  Meals: Start with liquid foods such as gelatin or soup. Progress to regular foods as tolerated. Avoid greasy, spicy, heavy foods. If nausea and/or vomiting occur, drink only clear liquids until the nausea and/or vomiting subsides. Call your physician if vomiting continues.  Special Instructions/Symptoms: Your throat may feel dry or sore from the anesthesia or the breathing tube placed in your throat during surgery. If this causes discomfort, gargle with warm salt water. The discomfort should disappear within 24 hours.  If you had a scopolamine patch placed behind your ear for the management of post- operative nausea and/or vomiting:  1. The medication in the patch is effective for 72 hours, after which it should be removed.  Wrap patch in a tissue and discard in the trash. Wash hands thoroughly with soap and water. 2. You may remove the patch earlier than 72 hours if you experience unpleasant side effects which may include dry mouth, dizziness or visual disturbances. 3. Avoid touching the patch. Wash your hands with soap and water after contact with the patch.

## 2017-12-21 NOTE — H&P (Signed)
[name] is an 67 y.o. female.   Chief Complaint: breast lesion HPI:  18 yof who I know from prior left breast cancer. she had a tnbc in 2015 treated with primary chemotherapy followed by left lump/sn with complete path response. she then underwent adjuvant radiotherapy. she has a palb2 mutation. she has done well since then with few effects from treatment. she underwent screening mm this year. she had no mass or dc. her density is b. on the left she has stable surgical changes. the right side has nodular asymmetries. US shows a right breast cyst and then a 8x3x9 mm mass 1 cm from nipple at 9 oclock. no axillary adenopathy. 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 she is here to discuss options  Past Medical History:  Diagnosis Date  . Colon cancer ([place])   . Hx of rotator cuff surgery   . left breast cancer   . Personal history of chemotherapy 2016   left  . Personal history of radiation therapy 2016   left breast   . Radiation 11/15/14-01/02/15   left breast, axillary and supraclavicular region 45 gray, lumpectomy cavity boosted to 16 gray    Past Surgical History:  Procedure Laterality Date  . ABDOMINAL HYSTERECTOMY  1988  . BREAST LUMPECTOMY Left 2016  . COLON SURGERY  1988   colectomy/colostomy-ca  . COLON SURGERY  1988   colostomy takedown-reversal  . COLONOSCOPY    . EXCISION / BIOPSY BREAST / NIPPLE / DUCT Left 09/20/2014  . RADIOACTIVE SEED GUIDED PARTIAL MASTECTOMY WITH AXILLARY SENTINEL LYMPH NODE BIOPSY Left 09/20/2014   Procedure: RADIOACTIVE SEED GUIDED LEFT BREAST LUMPECTOMY WITH AXILLARY SENTINEL LYMPH NODE BIOPSY;  Surgeon: Rolm Bookbinder, MD;  Location: [place];  Service: General;  Laterality: Left;  . TOTAL SHOULDER ARTHROPLASTY  2009   right    Family History  Problem Relation Age of Onset  . Cancer Mother 73       breast  . Breast cancer Mother   . Cancer Maternal Aunt        breast cancer at unknown age  .  Breast cancer Maternal Aunt    Social History:  reports that she has never smoked. She has never used smokeless tobacco. She reports that she drinks alcohol. She reports that she does not use drugs.  Allergies: No Known Allergies  Medications Prior to Admission  Medication Sig Dispense Refill  . acetaminophen (TYLENOL) 500 MG tablet Take 1,000 mg by mouth every 6 (six) hours as needed for mild pain or moderate pain.    Marland Kitchen gabapentin (NEURONTIN) 300 MG capsule TAKE 1 CAPSULE BY MOUTH AT BEDTIME 90 capsule 0  . loteprednol (LOTEMAX) 0.5 % ophthalmic suspension Place 1 drop into both eyes 3 (three) times daily. Pt use this medication as needed.    Marland Kitchen LORazepam (ATIVAN) 1 MG tablet Take 15 minutes before MRI; may repeat once 5 tablet 0  . potassium chloride (K-DUR) 10 MEQ tablet Take 1 tablet (10 mEq total) by mouth 2 (two) times daily. 60 tablet 4    No results found for this or any previous visit (from the past 48 hour(s)). No results found.  Review of Systems  All other systems reviewed and are negative.   Blood pressure 123/81, pulse 86, temperature 98.3 F (36.8 C), temperature source Oral, resp. rate 18, height 5' 2"  (1.575 m), weight 109.8 kg (242 lb), SpO2 100 %. Physical Exam   Vitals ([name] CMA; 12/01/2017 10:04 AM)  12/01/2017 10:03 AM Weight: 236 lb Height: 62in Body Surface Area: 2.05 m Body Mass Index: 43.16 kg/m  Pulse: 90 (Regular)  BP: 124/82 (Sitting, Left Arm, Standard) Physical Exam Rolm Bookbinder MD; 12/01/2017 10:29 AM) General Mental Status-Alert. Orientation-Oriented X3. Breast Nipples-No Discharge. Breast Lump-No Palpable Breast Mass. Lymphatic Head & Neck General Head & Neck Lymphatics: Bilateral - Description - Normal. Axillary General Axillary Region: Bilateral - Description - Normal. Note: no [name] adenopathy  Assessment/Plan PAPILLOMA OF RIGHT BREAST (D24.1) Story: Right breast seed guided excisional biopsy we  discussed path results. with palb2 mutation and her personal history I think best to excise this likely benign lesion. we discussed seed placement, surgery and recovery. will proceed asap we also discussed following her with mm and mri which she has scheduled in the future.     Rolm Bookbinder, MD 12/21/2017, 8:08 AM

## 2017-12-21 NOTE — Op Note (Signed)
Preoperative diagnoses:right breast mass with core biopsy c/w papilloma Postoperative diagnosis: Same as above Procedure: Rightbreastseed guided excisional biopsy Surgeon: Dr. Serita Grammes Anesthesia: Gen. Estimated blood loss: minimal Complications: None Drains: None Specimens:rightbreast tissue marked with paint Sponge and needle count correct at completion Disposition to recovery stable  Indications: This is a61 yof with history of left breast cancer and PALB2 mutation who has a right breast mass that on core biopsy is a papilloma.  We discussed excision with seed guidance.   Procedure: After informed consent was obtained she was then taken to the operating room. She was given antibiotics.Sequential compression devices were on her legs. She was placed under general anesthesia without complication. Her chestwas then prepped and draped in the standard sterile surgical fashion. A surgical timeout was then performed.   The seed was in the lower outer quadrant of the right breastI infiltrated marcaine throughout the area.I then made a periareolar incision to hide the scarI then used the neoprobe to guide excision of the seed and the surrounding tissue.  This was then all sent to pathology. Hemostasis was observed. I closed the breast tissue with a 2-0 Vicryl. The dermis was closed with 3-0 Vicryl and the skin with 5-0 Monocryl.Dermabond and steristrips were placed on the incision. A breast binder was placed. She was transferred to recovery stable

## 2017-12-21 NOTE — Anesthesia Postprocedure Evaluation (Signed)
Anesthesia Post Note  Patient: [name]  Procedure(s) Performed: RIGHT RADIOACTIVE SEED GUIDED EXCISIONAL BREAST BIOPSY ERAS PATHWAY (Right Breast)     Patient location during evaluation: PACU Anesthesia Type: General Level of consciousness: awake and alert Pain management: pain level controlled Vital Signs Assessment: post-procedure vital signs reviewed and stable Respiratory status: spontaneous breathing, nonlabored ventilation and respiratory function stable Cardiovascular status: blood pressure returned to baseline and stable Postop Assessment: no apparent nausea or vomiting Anesthetic complications: no    Last Vitals:  Vitals:   12/21/17 1001 12/21/17 1020  BP:  (!) 144/77  Pulse: 70 71  Resp: 13 16  Temp:  36.4 C  SpO2: 100% 100%    Last Pain:  Vitals:   12/21/17 1020  TempSrc:   PainSc: 0-No pain                 Catalina Gravel

## 2017-12-22 ENCOUNTER — Encounter (HOSPITAL_BASED_OUTPATIENT_CLINIC_OR_DEPARTMENT_OTHER): Payer: Self-pay | Admitting: General Surgery

## 2017-12-31 ENCOUNTER — Ambulatory Visit
Admission: RE | Admit: 2017-12-31 | Discharge: 2017-12-31 | Disposition: A | Discharge status: Home / Self Care | Payer: Medicare Other | Source: Ambulatory Visit | Attending: Internal Medicine | Admitting: Internal Medicine

## 2017-12-31 DIAGNOSIS — Z780 Asymptomatic menopausal state: Secondary | ICD-10-CM | POA: Diagnosis not present

## 2017-12-31 DIAGNOSIS — E2839 Other primary ovarian failure: Secondary | ICD-10-CM

## 2017-12-31 DIAGNOSIS — Z13820 Encounter for screening for osteoporosis: Secondary | ICD-10-CM | POA: Diagnosis not present

## 2017-12-31 MED FILL — LORazepam 1 MG TABS: 1 | 2 days supply | Qty: 5 | Fill #0

## 2018-01-05 ENCOUNTER — Ambulatory Visit
Admission: RE | Admit: 2018-01-05 | Discharge: 2018-01-05 | Disposition: A | Discharge status: Home / Self Care | Payer: Medicare Other | Source: Ambulatory Visit | Attending: Oncology | Admitting: Oncology

## 2018-01-05 ENCOUNTER — Encounter: Payer: Self-pay | Admitting: Oncology

## 2018-01-05 DIAGNOSIS — C50412 Malignant neoplasm of upper-outer quadrant of left female breast: Secondary | ICD-10-CM

## 2018-01-05 DIAGNOSIS — Z853 Personal history of malignant neoplasm of breast: Secondary | ICD-10-CM | POA: Diagnosis not present

## 2018-01-05 DIAGNOSIS — Z1589 Genetic susceptibility to other disease: Secondary | ICD-10-CM

## 2018-01-05 DIAGNOSIS — Z1502 Genetic susceptibility to malignant neoplasm of ovary: Secondary | ICD-10-CM

## 2018-01-05 DIAGNOSIS — Z1509 Genetic susceptibility to other malignant neoplasm: Secondary | ICD-10-CM

## 2018-01-05 DIAGNOSIS — C50919 Malignant neoplasm of unspecified site of unspecified female breast: Secondary | ICD-10-CM

## 2018-01-05 DIAGNOSIS — C189 Malignant neoplasm of colon, unspecified: Secondary | ICD-10-CM

## 2018-01-05 DIAGNOSIS — Z171 Estrogen receptor negative status [ER-]: Principal | ICD-10-CM

## 2018-01-05 MED ORDER — GADOBENATE DIMEGLUMINE 529 MG/ML IV SOLN
20.0000 mL | Freq: Once | INTRAVENOUS | Status: AC | PRN
  Administered 2018-01-05: 20 mL via INTRAVENOUS

## 2018-05-18 DIAGNOSIS — K760 Fatty (change of) liver, not elsewhere classified: Secondary | ICD-10-CM | POA: Diagnosis not present

## 2018-05-18 DIAGNOSIS — R635 Abnormal weight gain: Secondary | ICD-10-CM | POA: Diagnosis not present

## 2018-05-18 DIAGNOSIS — Z1211 Encounter for screening for malignant neoplasm of colon: Secondary | ICD-10-CM | POA: Diagnosis not present

## 2018-05-25 DIAGNOSIS — K760 Fatty (change of) liver, not elsewhere classified: Secondary | ICD-10-CM | POA: Diagnosis not present

## 2018-07-05 ENCOUNTER — Inpatient Hospital Stay: Payer: Medicare Other | Attending: Oncology

## 2018-07-05 DIAGNOSIS — Z1501 Genetic susceptibility to malignant neoplasm of breast: Secondary | ICD-10-CM | POA: Diagnosis not present

## 2018-07-05 DIAGNOSIS — Z171 Estrogen receptor negative status [ER-]: Secondary | ICD-10-CM | POA: Insufficient documentation

## 2018-07-05 DIAGNOSIS — C50919 Malignant neoplasm of unspecified site of unspecified female breast: Secondary | ICD-10-CM

## 2018-07-05 DIAGNOSIS — E041 Nontoxic single thyroid nodule: Secondary | ICD-10-CM | POA: Diagnosis not present

## 2018-07-05 DIAGNOSIS — Z1502 Genetic susceptibility to malignant neoplasm of ovary: Secondary | ICD-10-CM

## 2018-07-05 DIAGNOSIS — Z1509 Genetic susceptibility to other malignant neoplasm: Secondary | ICD-10-CM | POA: Insufficient documentation

## 2018-07-05 DIAGNOSIS — Z9221 Personal history of antineoplastic chemotherapy: Secondary | ICD-10-CM | POA: Diagnosis not present

## 2018-07-05 DIAGNOSIS — Z923 Personal history of irradiation: Secondary | ICD-10-CM | POA: Insufficient documentation

## 2018-07-05 DIAGNOSIS — C50412 Malignant neoplasm of upper-outer quadrant of left female breast: Secondary | ICD-10-CM | POA: Diagnosis not present

## 2018-07-05 DIAGNOSIS — Z85038 Personal history of other malignant neoplasm of large intestine: Secondary | ICD-10-CM | POA: Diagnosis not present

## 2018-07-05 DIAGNOSIS — C773 Secondary and unspecified malignant neoplasm of axilla and upper limb lymph nodes: Secondary | ICD-10-CM | POA: Insufficient documentation

## 2018-07-05 DIAGNOSIS — Z1589 Genetic susceptibility to other disease: Secondary | ICD-10-CM

## 2018-07-05 LAB — CBC WITH DIFFERENTIAL/PLATELET
ABS IMMATURE GRANULOCYTES: 0.01 10*3/uL (ref 0.00–0.07)
BASOS PCT: 0 %
Basophils Absolute: 0 10*3/uL (ref 0.0–0.1)
Eosinophils Absolute: 0.1 10*3/uL (ref 0.0–0.5)
Eosinophils Relative: 2 %
HCT: 40.9 % (ref 36.0–46.0)
Hemoglobin: 13.2 g/dL (ref 12.0–15.0)
Immature Granulocytes: 0 %
LYMPHS ABS: 1.6 10*3/uL (ref 0.7–4.0)
Lymphocytes Relative: 34 %
MCH: 29.6 pg (ref 26.0–34.0)
MCHC: 32.3 g/dL (ref 30.0–36.0)
MCV: 91.7 fL (ref 80.0–100.0)
MONOS PCT: 8 %
Monocytes Absolute: 0.4 10*3/uL (ref 0.1–1.0)
NEUTROS ABS: 2.6 10*3/uL (ref 1.7–7.7)
NEUTROS PCT: 56 %
PLATELETS: 258 10*3/uL (ref 150–400)
RBC: 4.46 MIL/uL (ref 3.87–5.11)
RDW: 12.5 % (ref 11.5–15.5)
WBC: 4.7 10*3/uL (ref 4.0–10.5)
nRBC: 0 % (ref 0.0–0.2)

## 2018-07-05 LAB — COMPREHENSIVE METABOLIC PANEL
ALBUMIN: 3.8 g/dL (ref 3.5–5.0)
ALT: 15 U/L (ref 0–44)
ANION GAP: 11 (ref 5–15)
AST: 13 U/L — ABNORMAL LOW (ref 15–41)
Alkaline Phosphatase: 128 U/L — ABNORMAL HIGH (ref 38–126)
BUN: 12 mg/dL (ref 8–23)
CHLORIDE: 105 mmol/L (ref 98–111)
CO2: 24 mmol/L (ref 22–32)
Calcium: 10.1 mg/dL (ref 8.9–10.3)
Creatinine, Ser: 1.07 mg/dL — ABNORMAL HIGH (ref 0.44–1.00)
GFR calc Af Amer: >60 mL/min (ref >60)
GFR calc non Af Amer: 52 mL/min — ABNORMAL LOW (ref >60)
GLUCOSE: 93 mg/dL (ref 70–99)
POTASSIUM: 4 mmol/L (ref 3.5–5.1)
SODIUM: 140 mmol/L (ref 135–145)
TOTAL PROTEIN: 8 g/dL (ref 6.5–8.1)
Total Bilirubin: 1.9 mg/dL — ABNORMAL HIGH (ref 0.3–1.2)

## 2018-07-08 ENCOUNTER — Telehealth: Payer: Self-pay | Admitting: Oncology

## 2018-07-08 ENCOUNTER — Encounter: Payer: Self-pay | Admitting: Oncology

## 2018-07-08 ENCOUNTER — Inpatient Hospital Stay (HOSPITAL_BASED_OUTPATIENT_CLINIC_OR_DEPARTMENT_OTHER): Payer: Medicare Other | Admitting: Oncology

## 2018-07-08 VITALS — BP 131/86 | HR 95 | Temp 98.9°F | Resp 18 | Ht 62.0 in | Wt 243.0 lb

## 2018-07-08 DIAGNOSIS — Z85038 Personal history of other malignant neoplasm of large intestine: Secondary | ICD-10-CM

## 2018-07-08 DIAGNOSIS — Z1509 Genetic susceptibility to other malignant neoplasm: Secondary | ICD-10-CM

## 2018-07-08 DIAGNOSIS — Z9221 Personal history of antineoplastic chemotherapy: Secondary | ICD-10-CM | POA: Diagnosis not present

## 2018-07-08 DIAGNOSIS — C189 Malignant neoplasm of colon, unspecified: Secondary | ICD-10-CM

## 2018-07-08 DIAGNOSIS — C50412 Malignant neoplasm of upper-outer quadrant of left female breast: Secondary | ICD-10-CM

## 2018-07-08 DIAGNOSIS — Z171 Estrogen receptor negative status [ER-]: Secondary | ICD-10-CM

## 2018-07-08 DIAGNOSIS — Z923 Personal history of irradiation: Secondary | ICD-10-CM

## 2018-07-08 DIAGNOSIS — E041 Nontoxic single thyroid nodule: Secondary | ICD-10-CM

## 2018-07-08 DIAGNOSIS — C773 Secondary and unspecified malignant neoplasm of axilla and upper limb lymph nodes: Secondary | ICD-10-CM | POA: Diagnosis not present

## 2018-07-08 DIAGNOSIS — C50919 Malignant neoplasm of unspecified site of unspecified female breast: Secondary | ICD-10-CM

## 2018-07-08 DIAGNOSIS — Z1501 Genetic susceptibility to malignant neoplasm of breast: Secondary | ICD-10-CM | POA: Diagnosis not present

## 2018-07-08 DIAGNOSIS — Z1502 Genetic susceptibility to malignant neoplasm of ovary: Principal | ICD-10-CM

## 2018-07-08 DIAGNOSIS — Z1589 Genetic susceptibility to other disease: Principal | ICD-10-CM

## 2018-07-08 MED ORDER — ANASTROZOLE 1 MG PO TABS: 1.0000 mg | ORAL_TABLET | Freq: Every day | ORAL | 4 refills | Status: DC

## 2018-07-08 NOTE — Telephone Encounter (Signed)
Gave avs and calendar ° °

## 2018-07-08 NOTE — Progress Notes (Signed)
[name]  [phone] [phone]     ID: [name] DOB: 1951-03-21  MR#: [id number]  MOQ#:[id number]  Patient Care Team: Glendale Chard, MD as PCP - General (Internal Medicine) Grove Defina, Virgie Dad, MD as Consulting Physician (Oncology) Rolm Bookbinder, MD as Consulting Physician (General Surgery) Gery Pray, MD as Consulting Physician (Radiation Oncology)   CHIEF COMPLAINT: Stage III breast cancer, triple negative, in patient with high risk mutation  CURRENT TREATMENT: Intensified screening, anastrozole  BREAST CANCER HISTORY: From the original intake note:  [name] herself palpated a mass in her left breast, and immediately brought it to the attention of her primary care physician, Dr. Baird Cancer, who confirmed the finding and setup the patient for bilateral diagnostic mammography at the breast Center 02/20/2014. This showed a high density mass in the outer left breast measuring up to 3.7 cm. It corresponded to the site of palpable concern. In addition the normal 4 logically abnormal lymph nodes in the left axilla. The mass was palpable by exam, and by ultrasonography measured 2.6 cm. There were enlarged and morphologically abnormal lymph nodes in the left axilla, largest measuring 3 cm.  Biopsy of the left breast mass in question and 1 of the abnormal axillary lymph nodes 02/22/2014 showed ([id number]) biopsies to be positive for an invasive ductal carcinoma, grade 3, triple negative, with an MIB-1 of 90%. Specifically the HER-2 signals ratio was 1.05, and the number per cell 2.00.  MRI of the breasts 03/01/2014 showed a 3.8 cm enhancing mass with areas of apparent necrosis in the left breast, as well as the biopsy tract extending laterally, the combined measure 5.3 cm. There were no other masses or areas of abnormal enhancement. There were multiple abnormally enlarged left axillary lymph nodes with loss of the normal fatty hilum. These included  level I and retropectoral lymph nodes.  The patient's subsequent history is as detailed below   INTERVAL HISTORY: [name] returns today for follow-up of her estrogen receptor negative breast cancer. She also carries a PALB2 mutation, which puts her at risk of further breast cancer developing as well as other cancers that we have no screening options for. She continues on observation.   Since her last visit, she underwent right breast lumpectomy on 12/21/2017 under Dr. [name]. This showed ([id number]): ductal papilloma, fibrocystic changes with focal usual ductal hyperplasia and calcification, and no evidence of malignancy. She reports the procedure went well with no complications.  She also underwent breast MRI on 01/05/2018, following her biopsy. This showed no evidence to suggest malignancy within either breast.  She also underwent bone density testing on 12/31/2017, showing a T-score of -0.6.   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 The patient denies unusual headaches, visual changes, nausea, vomiting, stiff neck, dizziness, or gait imbalance. There has been no cough, phlegm production, or pleurisy, no chest pain or pressure, and no change in bowel or bladder habits. The patient denies fever, rash, bleeding, unexplained fatigue or unexplained weight loss. A detailed review of systems was otherwise entirely negative.    PAST MEDICAL HISTORY: Past Medical History:  Diagnosis Date  . Colon cancer ([place])   . Hx of rotator cuff surgery   . left breast cancer   . Personal history of chemotherapy 2016   left  . Personal history of radiation therapy 2016   left breast   . Radiation 11/15/14-01/02/15   left breast,  axillary and supraclavicular region 45 gray, lumpectomy cavity boosted to 16 gray    PAST SURGICAL HISTORY: Past Surgical History:  Procedure Laterality Date  . ABDOMINAL HYSTERECTOMY  1988  .  BREAST LUMPECTOMY Left 2016  . COLON SURGERY  1988   colectomy/colostomy-ca  . COLON SURGERY  1988   colostomy takedown-reversal  . COLONOSCOPY    . EXCISION / BIOPSY BREAST / NIPPLE / DUCT Left 09/20/2014  . RADIOACTIVE SEED GUIDED EXCISIONAL BREAST BIOPSY Right 12/21/2017   Procedure: RIGHT RADIOACTIVE SEED GUIDED EXCISIONAL BREAST BIOPSY ERAS PATHWAY;  Surgeon: Rolm Bookbinder, MD;  Location: [facility];  Service: General;  Laterality: Right;  . RADIOACTIVE SEED GUIDED PARTIAL MASTECTOMY WITH AXILLARY SENTINEL LYMPH NODE BIOPSY Left 09/20/2014   Procedure: RADIOACTIVE SEED GUIDED LEFT BREAST LUMPECTOMY WITH AXILLARY SENTINEL LYMPH NODE BIOPSY;  Surgeon: Rolm Bookbinder, MD;  Location: [facility];  Service: General;  Laterality: Left;  . TOTAL SHOULDER ARTHROPLASTY  2009   right    FAMILY HISTORY Family History  Problem Relation Age of Onset  . Cancer Mother 65       breast  . Breast cancer Mother        early 82s  . Stroke Father 78       cause of death  . Diabetes Father   . Cancer Maternal Aunt        breast cancer at unknown age  . Breast cancer Maternal Aunt   . Ovarian cancer Neg Hx    the patient's father died at the age of 80 following a stroke in the setting of diabetes; the patient's mother is living at 61. The patient has 3 brothers and 2 sisters. [name], [name], was diagnosed with breast cancer in her early 43s. There is no other breast or ovarian cancer in the family. The patient herself was diagnosed with watts by history he is an incidentally found stage I colon carcinoma noted at the time of her hysterectomy. She had a partial colectomy but no adjuvant treatment. We have no records regarding that procedure  GYNECOLOGIC HISTORY:  No LMP recorded. Patient has had a hysterectomy. Menarche age 85, first live birth age 25, the patient underwent total abdominal hysterectomy with bilateral salpingo-oophorectomy in her 73s.  She did not take hormone replacement. She did not take oral contraceptives at any point.  SOCIAL HISTORY:  [name] worked for [employer] for about 40 years, retiring in 2008. She is divorced and lives alone, with no pets. Her daughter [name] works for [employer] in Press photographer. The second daughter, [name], works as a Research scientist (physical sciences) for [employer]. The patient has 6 grandchildren and one great-grandchild. She attends a [employer].    ADVANCED DIRECTIVES: Not in place. The patient intends to name her daughter [name]. [name] is work number is [id number]. On the patient's 03/01/2014 visit she was given a copy of the appropriate documents to complete and notarize at her discretion   HEALTH MAINTENANCE: Social History   Tobacco Use  . Smoking status: Never Smoker  . Smokeless tobacco: Never Used  Substance Use Topics  . Alcohol use: Yes    Comment: occ-rare  . Drug use: No     Colonoscopy: 2000  PAP: May 2015  Bone density: 12/21/2017, T-score of -0.6  Lipid panel: 02/03/2014  No Known Allergies  Current Outpatient Medications  Medication Sig Dispense Refill  . acetaminophen (TYLENOL) 500 MG tablet Take 1,000 mg  by mouth every 6 (six) hours as needed for mild pain or moderate pain.    Marland Kitchen anastrozole (ARIMIDEX) 1 MG tablet Take 1 tablet (1 mg total) by mouth daily. 90 tablet 4  . gabapentin (NEURONTIN) 300 MG capsule TAKE 1 CAPSULE BY MOUTH AT BEDTIME 90 capsule 0  . LORazepam (ATIVAN) 1 MG tablet Take 15 minutes before MRI; may repeat once 5 tablet 0  . loteprednol (LOTEMAX) 0.5 % ophthalmic suspension Place 1 drop into both eyes 3 (three) times daily. Pt use this medication as needed.    . potassium chloride (K-DUR) 10 MEQ tablet Take 1 tablet (10 mEq total) by mouth 2 (two) times daily. 60 tablet 4  . traMADol (ULTRAM) 50 MG tablet Take 1 tablet (50 mg total) by mouth every 6 (six) hours as needed. 10 tablet 0   No current  facility-administered medications for this visit.     OBJECTIVE: Morbidly obese African American woman in no acute distress  There were no vitals filed for this visit.   There is no height or weight on file to calculate BMI.    ECOG FS:1 - Symptomatic but completely ambulatory  Sclerae unicteric, pupils round and equal Oropharynx clear and moist No cervical or supraclavicular adenopathy Lungs no rales or rhonchi Heart regular rate and rhythm Abd soft, nontender, positive bowel sounds MSK no focal spinal tenderness, no upper extremity lymphedema Neuro: nonfocal, well oriented, appropriate affect Breasts: The right breast is post lumpectomy.  It is otherwise unremarkable..  The left breast is status post lumpectomy and radiation.  There is no evidence of disease recurrence.  Both axillae are benign.  LAB RESULTS:  No results found for: SPEP  Lab Results  Component Value Date   WBC 4.7 07/05/2018   NEUTROABS 2.6 07/05/2018   HGB 13.2 07/05/2018   HCT 40.9 07/05/2018   MCV 91.7 07/05/2018   PLT 258 07/05/2018      Chemistry      Component Value Date/Time   NA 140 07/05/2018 1102   NA 142 11/13/2016 1050   K 4.0 07/05/2018 1102   K 4.1 11/13/2016 1050   CL 105 07/05/2018 1102   CO2 24 07/05/2018 1102   CO2 27 11/13/2016 1050   BUN 12 07/05/2018 1102   BUN 11.5 11/13/2016 1050   CREATININE 1.07 (H) 07/05/2018 1102   CREATININE 1.0 11/13/2016 1050      Component Value Date/Time   CALCIUM 10.1 07/05/2018 1102   CALCIUM 9.8 11/13/2016 1050   ALKPHOS 128 (H) 07/05/2018 1102   ALKPHOS 140 11/13/2016 1050   AST 13 (L) 07/05/2018 1102   AST 13 11/13/2016 1050   ALT 15 07/05/2018 1102   ALT 15 11/13/2016 1050   BILITOT 1.9 (H) 07/05/2018 1102   BILITOT 0.86 11/13/2016 1050       No results found for: LABCA2  No components found for: [id number]  No results for input(s): INR in the last 168 hours.  Urinalysis    Component Value Date/Time   COLORURINE YELLOW  04/05/2008 1120   APPEARANCEUR CLEAR 04/05/2008 1120   LABSPEC 1.021 04/05/2008 1120   PHURINE 6.0 04/05/2008 1120   GLUCOSEU NEGATIVE 04/05/2008 1120   HGBUR NEGATIVE 04/05/2008 1120   BILIRUBINUR NEGATIVE 04/05/2008 1120   KETONESUR NEGATIVE 04/05/2008 1120   PROTEINUR NEGATIVE 04/05/2008 1120   UROBILINOGEN 0.2 04/05/2008 1120   NITRITE NEGATIVE 04/05/2008 1120   LEUKOCYTESUR  04/05/2008 1120    NEGATIVE MICROSCOPIC NOT DONE ON URINES WITH NEGATIVE PROTEIN,  BLOOD, LEUKOCYTES, NITRITE, OR GLUCOSE <1000 mg/dL.    STUDIES: Recent pathology results reviewed with the patient  ASSESSMENT: 67 y.o. BRCA negative [place] woman s/p Left breast upper outer quadrant and left axillary lymph node biopsy 02/22/2014, both positive for a clinical T2 N2, stage IIIA invasive ductal carcinoma, grade 3, triple negative, with an MIB-1 of 90%  (1) neoadjuvant chemotherapy started 03/13/2014, with cyclophosphamide and doxorubicin in dose dense fashion x4, with Neulasta support on day 2, completed 04/24/2014, followed by weekly carboplatin and paclitaxel x12, paclitaxel reduced during cycle 5 and cycle 7 because of elevated bilirubin levels  (2) left lumpectomy and sentinel lymph node sampling 09/20/2014 showed a complete pathologic response.  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  (b) PALB2 mutations are known to increase the risk of breast and pancreatic cancer, possibly other cancers, but the data is preliminary  (c) the patient's 2 daughters have been tested and did not carry the gene  (d) Irean's MSH2 VUS has been reclassified as "likely benign"  (e) she is status post remote TAH/BSO  (f)  breast MRI 01/05/2018 unremarkable  (6) staging studies showed right-sided lung nodules, too small to characterize, and a dominant right-sided thyroid nodule unchanged in size as compared to 2011; to be followed  (a) chest CT 04/24/2016 showed the small pulmonary nodules to be completely stable, consistent with benign findings.  (b) consider  thyroid ultrasound   (7) intraductal papilloma biopsied right breast 11/06/2017  (a) status post right lumpectomy 12/21/2017 showing only ductal papilloma, no evidence of malignancy.  (8) anastrozole started 07/08/2018 for breast cancer prevention  (a) bone density scan 01/01/2018 normal, T score -0.6  PLAN: Konnie is now just about 4 years out from definitive surgery for her breast cancer with no evidence of disease recurrence.  This is very favorable.  She is at high risk of developing another breast cancer and she was able to undergo a breast MRI for intensified screening.  Fortunately that was benign.  We also reviewed the results of her surgery for the papilloma and that also showed no malignancy.  Today we discussed antiestrogens for breast cancer prophylaxis.  She has a good understanding of the possible toxicity side effects and complications particularly of the aromatase inhibitors and she will start anastrozole today.  She will let us know if she has any side effects from that medication.  She understands in general it will reduce her breast cancer risk by one half.  She is due for a thyroid ultrasound to serve as baseline for her thyroid nodule which was incidentally noted during a CT scan.  This is being ordered.  She will have mammography in December.  She will see [place] again in January.  She will then have a repeat breast MRI in July and see me in August.  She knows to call for any other issues that may develop before the next visit.     Tayten Heber, Virgie Dad, MD  07/08/18 2:29 PM Medical Oncology and Hematology [facility] [address] [place], [place] [address] Tel. [phone]    Fax. [phone]  This document serves as a record of services personally performed by Lurline Del, MD. It was created on  his behalf by Wilburn Mylar, a trained medical scribe. The creation of this record is based on the scribe's personal observations and the provider's statements to them.   I, Lurline Del MD, have reviewed the above documentation for accuracy and completeness, and I agree with the above.

## 2018-07-22 ENCOUNTER — Ambulatory Visit (HOSPITAL_COMMUNITY)
Admission: RE | Admit: 2018-07-22 | Discharge: 2018-07-22 | Disposition: A | Discharge status: Home / Self Care | Payer: Medicare Other | Source: Ambulatory Visit | Attending: Oncology | Admitting: Oncology

## 2018-07-22 DIAGNOSIS — C50412 Malignant neoplasm of upper-outer quadrant of left female breast: Secondary | ICD-10-CM

## 2018-07-22 DIAGNOSIS — E042 Nontoxic multinodular goiter: Secondary | ICD-10-CM | POA: Insufficient documentation

## 2018-07-22 DIAGNOSIS — Z1509 Genetic susceptibility to other malignant neoplasm: Secondary | ICD-10-CM | POA: Insufficient documentation

## 2018-07-22 DIAGNOSIS — E049 Nontoxic goiter, unspecified: Secondary | ICD-10-CM | POA: Diagnosis not present

## 2018-07-22 DIAGNOSIS — Z1589 Genetic susceptibility to other disease: Secondary | ICD-10-CM | POA: Diagnosis not present

## 2018-07-22 DIAGNOSIS — Z1502 Genetic susceptibility to malignant neoplasm of ovary: Secondary | ICD-10-CM | POA: Diagnosis not present

## 2018-07-22 DIAGNOSIS — C189 Malignant neoplasm of colon, unspecified: Secondary | ICD-10-CM

## 2018-07-22 DIAGNOSIS — Z171 Estrogen receptor negative status [ER-]: Secondary | ICD-10-CM | POA: Diagnosis not present

## 2018-07-22 DIAGNOSIS — E041 Nontoxic single thyroid nodule: Secondary | ICD-10-CM | POA: Diagnosis present

## 2018-07-22 DIAGNOSIS — C50919 Malignant neoplasm of unspecified site of unspecified female breast: Secondary | ICD-10-CM | POA: Diagnosis not present

## 2018-07-22 MED FILL — ANASTROZOLE 1 MG TABLET: 1 | 90 days supply | Qty: 90 | Fill #0

## 2018-08-02 DIAGNOSIS — Z1212 Encounter for screening for malignant neoplasm of rectum: Secondary | ICD-10-CM | POA: Diagnosis not present

## 2018-08-02 DIAGNOSIS — Z1211 Encounter for screening for malignant neoplasm of colon: Secondary | ICD-10-CM | POA: Diagnosis not present

## 2018-08-05 ENCOUNTER — Other Ambulatory Visit: Payer: Self-pay

## 2018-08-07 LAB — COLOGUARD: Cologuard: NEGATIVE

## 2018-08-16 ENCOUNTER — Other Ambulatory Visit: Payer: Self-pay | Admitting: *Deleted

## 2018-08-16 MED ORDER — GABAPENTIN 300 MG PO CAPS: 300.0000 mg | ORAL_CAPSULE | Freq: Every day | ORAL | 0 refills | Status: DC

## 2018-08-17 ENCOUNTER — Encounter: Payer: Self-pay | Admitting: Nurse Practitioner

## 2018-09-28 ENCOUNTER — Encounter: Payer: Self-pay | Admitting: Adult Health

## 2018-09-28 ENCOUNTER — Inpatient Hospital Stay: Payer: PPO | Attending: Oncology

## 2018-09-28 ENCOUNTER — Inpatient Hospital Stay (HOSPITAL_BASED_OUTPATIENT_CLINIC_OR_DEPARTMENT_OTHER): Payer: PPO | Admitting: Adult Health

## 2018-09-28 VITALS — BP 137/85 | HR 84 | Temp 98.2°F | Resp 18 | Ht 62.0 in | Wt 241.1 lb

## 2018-09-28 DIAGNOSIS — C773 Secondary and unspecified malignant neoplasm of axilla and upper limb lymph nodes: Secondary | ICD-10-CM

## 2018-09-28 DIAGNOSIS — Z171 Estrogen receptor negative status [ER-]: Principal | ICD-10-CM

## 2018-09-28 DIAGNOSIS — Z9049 Acquired absence of other specified parts of digestive tract: Secondary | ICD-10-CM | POA: Insufficient documentation

## 2018-09-28 DIAGNOSIS — Z85038 Personal history of other malignant neoplasm of large intestine: Secondary | ICD-10-CM

## 2018-09-28 DIAGNOSIS — R918 Other nonspecific abnormal finding of lung field: Secondary | ICD-10-CM | POA: Insufficient documentation

## 2018-09-28 DIAGNOSIS — Z79899 Other long term (current) drug therapy: Secondary | ICD-10-CM | POA: Insufficient documentation

## 2018-09-28 DIAGNOSIS — Z90710 Acquired absence of both cervix and uterus: Secondary | ICD-10-CM | POA: Diagnosis not present

## 2018-09-28 DIAGNOSIS — Z79811 Long term (current) use of aromatase inhibitors: Secondary | ICD-10-CM | POA: Diagnosis not present

## 2018-09-28 DIAGNOSIS — Z923 Personal history of irradiation: Secondary | ICD-10-CM | POA: Diagnosis not present

## 2018-09-28 DIAGNOSIS — Z170 Estrogen receptor positive status [ER+]: Secondary | ICD-10-CM

## 2018-09-28 DIAGNOSIS — C50412 Malignant neoplasm of upper-outer quadrant of left female breast: Secondary | ICD-10-CM | POA: Diagnosis not present

## 2018-09-28 DIAGNOSIS — E041 Nontoxic single thyroid nodule: Secondary | ICD-10-CM

## 2018-09-28 DIAGNOSIS — C50919 Malignant neoplasm of unspecified site of unspecified female breast: Secondary | ICD-10-CM

## 2018-09-28 DIAGNOSIS — Z1589 Genetic susceptibility to other disease: Secondary | ICD-10-CM

## 2018-09-28 DIAGNOSIS — Z1502 Genetic susceptibility to malignant neoplasm of ovary: Secondary | ICD-10-CM

## 2018-09-28 DIAGNOSIS — Z1509 Genetic susceptibility to other malignant neoplasm: Secondary | ICD-10-CM

## 2018-09-28 DIAGNOSIS — Z9221 Personal history of antineoplastic chemotherapy: Secondary | ICD-10-CM

## 2018-09-28 LAB — CBC WITH DIFFERENTIAL/PLATELET
Abs Immature Granulocytes: 0 10*3/uL (ref 0.00–0.07)
BASOS ABS: 0 10*3/uL (ref 0.0–0.1)
Basophils Relative: 1 %
EOS ABS: 0.2 10*3/uL (ref 0.0–0.5)
EOS PCT: 4 %
HCT: 37.5 % (ref 36.0–46.0)
HEMOGLOBIN: 12.1 g/dL (ref 12.0–15.0)
Immature Granulocytes: 0 %
LYMPHS ABS: 1.6 10*3/uL (ref 0.7–4.0)
LYMPHS PCT: 40 %
MCH: 29.2 pg (ref 26.0–34.0)
MCHC: 32.3 g/dL (ref 30.0–36.0)
MCV: 90.6 fL (ref 80.0–100.0)
MONOS PCT: 7 %
Monocytes Absolute: 0.3 10*3/uL (ref 0.1–1.0)
NEUTROS ABS: 2 10*3/uL (ref 1.7–7.7)
NEUTROS PCT: 48 %
Platelets: 273 10*3/uL (ref 150–400)
RBC: 4.14 MIL/uL (ref 3.87–5.11)
RDW: 12.5 % (ref 11.5–15.5)
WBC: 4.1 10*3/uL (ref 4.0–10.5)
nRBC: 0 % (ref 0.0–0.2)

## 2018-09-28 LAB — COMPREHENSIVE METABOLIC PANEL
ALBUMIN: 3.5 g/dL (ref 3.5–5.0)
ALK PHOS: 122 U/L (ref 38–126)
ALT: 15 U/L (ref 0–44)
ANION GAP: 8 (ref 5–15)
AST: 13 U/L — ABNORMAL LOW (ref 15–41)
BILIRUBIN TOTAL: 1 mg/dL (ref 0.3–1.2)
BUN: 11 mg/dL (ref 8–23)
CO2: 26 mmol/L (ref 22–32)
Calcium: 9.6 mg/dL (ref 8.9–10.3)
Chloride: 106 mmol/L (ref 98–111)
Creatinine, Ser: 0.98 mg/dL (ref 0.44–1.00)
GFR calc non Af Amer: 60 mL/min — ABNORMAL LOW (ref >60)
Glucose, Bld: 100 mg/dL — ABNORMAL HIGH (ref 70–99)
POTASSIUM: 4.1 mmol/L (ref 3.5–5.1)
Sodium: 140 mmol/L (ref 135–145)
TOTAL PROTEIN: 7.4 g/dL (ref 6.5–8.1)

## 2018-09-28 NOTE — Progress Notes (Signed)
[facility]  [phone] [phone]     ID: JASIAH BUNTIN DOB: 21-Sep-1950  MR#: [id number]  JYN#:[id number]  Patient Care Team: Glendale Chard, MD as PCP - General (Internal Medicine) Magrinat, Virgie Dad, MD as Consulting Physician (Oncology) Rolm Bookbinder, MD as Consulting Physician (General Surgery) Gery Pray, MD as Consulting Physician (Radiation Oncology)   CHIEF COMPLAINT: Stage III breast cancer, triple negative, in patient with high risk mutation  CURRENT TREATMENT: Intensified screening, anastrozole  BREAST CANCER HISTORY: From the original intake note:  Kennedey herself palpated a mass in her left breast, and immediately brought it to the attention of her primary care physician, Dr. Baird Cancer, who confirmed the finding and setup the patient for bilateral diagnostic mammography at the [facility] Center 02/20/2014. This showed a high density mass in the outer left breast measuring up to 3.7 cm. It corresponded to the site of palpable concern. In addition the normal 4 logically abnormal lymph nodes in the left axilla. The mass was palpable by exam, and by ultrasonography measured 2.6 cm. There were enlarged and morphologically abnormal lymph nodes in the left axilla, largest measuring 3 cm.  Biopsy of the left breast mass in question and 1 of the abnormal axillary lymph nodes 02/22/2014 showed ([id number]) biopsies to be positive for an invasive ductal carcinoma, grade 3, triple negative, with an MIB-1 of 90%. Specifically the HER-2 signals ratio was 1.05, and the number per cell 2.00.  MRI of the breasts 03/01/2014 showed a 3.8 cm enhancing mass with areas of apparent necrosis in the left breast, as well as the biopsy tract extending laterally, the combined measure 5.3 cm. There were no other masses or areas of abnormal enhancement. There were multiple abnormally enlarged left axillary lymph nodes with loss of the normal fatty hilum. These included  level I and retropectoral lymph nodes.  The patient's subsequent history is as detailed below   INTERVAL HISTORY: Aralynn returns today for follow-up of her estrogen receptor negative breast cancer. She also carries a PALB2 mutation, which puts her at risk of further breast cancer developing as well as other cancers that we have no screening options for. She continues on observation.   She was started on Anastrozole in 06/2018.  She underwent bone density testing on 12/31/2017, showing a T-score of -0.6. She is tolerating the Anastrozole well.  She denies any hot flashes, vaginal dryness, or arthralgias.    She is due for mammogram.  She tells me that she just underwent colonoscopy a couple of months ago.   REVIEW OF SYSTEMS: Thula is feeling well today.  She denies any unusual headaches or vision changes.  She hasn't noted any breast changes.  She is without chest pain, palpitations, cough, or shortness of breath.  She denies bowel/bladder changes, nausea, vomiting.  She is feeling well and a detailed ROS was otherwise non contributory.     PAST MEDICAL HISTORY: Past Medical History:  Diagnosis Date  . Colon cancer ([facility])   . Hx of rotator cuff surgery   . left breast cancer   . Personal history of chemotherapy 2016   left  . Personal history of radiation therapy 2016   left breast   . Radiation 11/15/14-01/02/15   left breast, axillary and supraclavicular region 45 gray, lumpectomy cavity boosted to 16 gray    PAST SURGICAL HISTORY: Past Surgical History:  Procedure Laterality Date  . ABDOMINAL HYSTERECTOMY  1988  . BREAST LUMPECTOMY Left 2016  . COLON SURGERY  1988   colectomy/colostomy-ca  . COLON SURGERY  1988   colostomy takedown-reversal  . COLONOSCOPY    . EXCISION / BIOPSY BREAST / NIPPLE / DUCT Left 09/20/2014  . RADIOACTIVE SEED GUIDED EXCISIONAL BREAST BIOPSY Right 12/21/2017   Procedure: RIGHT RADIOACTIVE SEED GUIDED EXCISIONAL BREAST BIOPSY ERAS PATHWAY;  Surgeon:  Rolm Bookbinder, MD;  Location: [facility];  Service: General;  Laterality: Right;  . RADIOACTIVE SEED GUIDED PARTIAL MASTECTOMY WITH AXILLARY SENTINEL LYMPH NODE BIOPSY Left 09/20/2014   Procedure: RADIOACTIVE SEED GUIDED LEFT BREAST LUMPECTOMY WITH AXILLARY SENTINEL LYMPH NODE BIOPSY;  Surgeon: Rolm Bookbinder, MD;  Location: [facility];  Service: General;  Laterality: Left;  . TOTAL SHOULDER ARTHROPLASTY  2009   right    FAMILY HISTORY Family History  Problem Relation Age of Onset  . Cancer Mother 88       breast  . Breast cancer Mother        early 16s  . Stroke Father 16       cause of death  . Diabetes Father   . Cancer Maternal Aunt        breast cancer at unknown age  . Breast cancer Maternal Aunt   . Ovarian cancer Neg Hx    the patient's father died at the age of 19 following a stroke in the setting of diabetes; the patient's mother is living at 64. The patient has 3 brothers and 2 sisters. [name], [name], was diagnosed with breast cancer in her early 57s. There is no other breast or ovarian cancer in the family. The patient herself was diagnosed with watts by history he is an incidentally found stage I colon carcinoma noted at the time of her hysterectomy. She had a partial colectomy but no adjuvant treatment. We have no records regarding that procedure  GYNECOLOGIC HISTORY:  No LMP recorded. Patient has had a hysterectomy. Menarche age 89, first live birth age 76, the patient underwent total abdominal hysterectomy with bilateral salpingo-oophorectomy in her 61s. She did not take hormone replacement. She did not take oral contraceptives at any point.  SOCIAL HISTORY:  Chamaine worked for [employer] for about 40 years, retiring in 2008. She is divorced and lives alone, with no pets. Her daughter Misako Roeder works for [employer] in Press photographer. The second daughter, Kallan Bischoff, works as a Scientist, water quality at [employer].  The  patient has 6 grandchildren and three great-grandchildren. She attends a [employer]. (updated 09/28/2018)    ADVANCED DIRECTIVES: Not in place. The patient intends to name her daughter Marcello Fennel. her healthcare power of attorney. [name] is work number is [phone]. On the patient's 03/01/2014 visit she was given a copy of the appropriate documents to complete and notarize at her discretion.     HEALTH MAINTENANCE: Social History   Tobacco Use  . Smoking status: Never Smoker  . Smokeless tobacco: Never Used  Substance Use Topics  . Alcohol use: Yes    Comment: occ-rare  . Drug use: No     Colonoscopy: 2000  PAP: May 2015  Bone density: 12/21/2017, T-score of -0.6  Lipid panel: 02/03/2014  No Known Allergies  Current Outpatient Medications  Medication Sig Dispense Refill  . acetaminophen (TYLENOL) 500 MG tablet Take 1,000 mg by mouth every 6 (six) hours as needed for mild pain or moderate pain.    Marland Kitchen anastrozole (ARIMIDEX) 1 MG tablet Take 1 tablet (1 mg total) by mouth daily. 90 tablet 4  .  gabapentin (NEURONTIN) 300 MG capsule Take 1 capsule (300 mg total) by mouth at bedtime. 90 capsule 0  . loteprednol (LOTEMAX) 0.5 % ophthalmic suspension Place 1 drop into both eyes 3 (three) times daily. Pt use this medication as needed.    . potassium chloride (K-DUR) 10 MEQ tablet Take 1 tablet (10 mEq total) by mouth 2 (two) times daily. 60 tablet 4  . ursodiol (ACTIGALL) 500 MG tablet Take 500 mg by mouth 3 (three) times daily.     No current facility-administered medications for this visit.     OBJECTIVE:   Vitals:   09/28/18 0921  BP: 137/85  Pulse: 84  Resp: 18  Temp: 98.2 F (36.8 C)  SpO2: 100%     Body mass index is 44.09 kg/m.    ECOG FS:1 - Symptomatic but completely ambulatory GENERAL: Patient is a well appearing female in no acute distress HEENT:  Sclerae anicteric.  Oropharynx clear and moist. No ulcerations or evidence of oropharyngeal candidiasis. Neck is supple.    NODES:  No cervical, supraclavicular, or axillary lymphadenopathy palpated.  BREAST EXAM:  Right breast s/p lumpectomy, benign, left breast s/p lumpectomy and radiation, no sign of local recurrence LUNGS:  Clear to auscultation bilaterally.  No wheezes or rhonchi. HEART:  Regular rate and rhythm. No murmur appreciated. ABDOMEN:  Soft, nontender.  Positive, normoactive bowel sounds. No organomegaly palpated. MSK:  No focal spinal tenderness to palpation. Full range of motion bilaterally in the upper extremities. EXTREMITIES:  No peripheral edema.   SKIN:  Clear with no obvious rashes or skin changes. No nail dyscrasia. NEURO:  Nonfocal. Well oriented.  Appropriate affect.    LAB RESULTS:  No results found for: SPEP  Lab Results  Component Value Date   WBC 4.1 09/28/2018   NEUTROABS 2.0 09/28/2018   HGB 12.1 09/28/2018   HCT 37.5 09/28/2018   MCV 90.6 09/28/2018   PLT 273 09/28/2018      Chemistry      Component Value Date/Time   NA 140 09/28/2018 0851   NA 142 11/13/2016 1050   K 4.1 09/28/2018 0851   K 4.1 11/13/2016 1050   CL 106 09/28/2018 0851   CO2 26 09/28/2018 0851   CO2 27 11/13/2016 1050   BUN 11 09/28/2018 0851   BUN 11.5 11/13/2016 1050   CREATININE 0.98 09/28/2018 0851   CREATININE 1.0 11/13/2016 1050      Component Value Date/Time   CALCIUM 9.6 09/28/2018 0851   CALCIUM 9.8 11/13/2016 1050   ALKPHOS 122 09/28/2018 0851   ALKPHOS 140 11/13/2016 1050   AST 13 (L) 09/28/2018 0851   AST 13 11/13/2016 1050   ALT 15 09/28/2018 0851   ALT 15 11/13/2016 1050   BILITOT 1.0 09/28/2018 0851   BILITOT 0.86 11/13/2016 1050       No results found for: LABCA2  No components found for: LABCA125  No results for input(s): INR in the last 168 hours.  Urinalysis    Component Value Date/Time   COLORURINE YELLOW 04/05/2008 1120   APPEARANCEUR CLEAR 04/05/2008 1120   LABSPEC 1.021 04/05/2008 1120   PHURINE 6.0 04/05/2008 1120   GLUCOSEU NEGATIVE 04/05/2008  1120   HGBUR NEGATIVE 04/05/2008 1120   BILIRUBINUR NEGATIVE 04/05/2008 1120   KETONESUR NEGATIVE 04/05/2008 1120   PROTEINUR NEGATIVE 04/05/2008 1120   UROBILINOGEN 0.2 04/05/2008 1120   NITRITE NEGATIVE 04/05/2008 1120   LEUKOCYTESUR  04/05/2008 1120    NEGATIVE MICROSCOPIC NOT DONE ON URINES WITH NEGATIVE  PROTEIN, BLOOD, LEUKOCYTES, NITRITE, OR GLUCOSE <1000 mg/dL.    STUDIES:    ASSESSMENT: 68 y.o. BRCA negative [place] woman s/p Left breast upper outer quadrant and left axillary lymph node biopsy 02/22/2014, both positive for a clinical T2 N2, stage IIIA invasive ductal carcinoma, grade 3, triple negative, with an MIB-1 of 90%  (1) neoadjuvant chemotherapy started 03/13/2014, with cyclophosphamide and doxorubicin in dose dense fashion x4, with Neulasta support on day 2, completed 04/24/2014, followed by weekly carboplatin and paclitaxel x12, paclitaxel reduced during cycle 5 and cycle 7 because of elevated bilirubin levels  (2) left lumpectomy and sentinel lymph node sampling 09/20/2014 showed a complete pathologic response.  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  (b) PALB2 mutations are known to increase the risk of breast and pancreatic cancer, possibly other cancers, but the data is preliminary  (c) the patient's 2 daughters have been tested and did not carry the gene  (d) Mella's MSH2 VUS has been reclassified as "likely benign"  (e) she is status post remote TAH/BSO  (f) breast MRI 01/05/2018 unremarkable  (6) staging studies showed right-sided lung nodules, too small to characterize, and a dominant right-sided thyroid nodule unchanged in size as compared to 2011;  to be followed  (a) chest CT 04/24/2016 showed the small pulmonary nodules to be completely stable, consistent with benign findings.  (b) consider  thyroid ultrasound--done on 07/22/2018 and showed enlarged heterogeneous lobular and multinodular thyroid gland, TI-RADS category 3 nodules, not meeting criteria for further biopsy or dedicated imaging f/u.    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  (8) anastrozole started 07/08/2018 for breast cancer prevention  (a) bone density scan 01/01/2018 normal, T score -0.6  PLAN: Kyerra is doing well today.  She has no clinical or radiographic sign of recurrence today.  She is taking Anastrozole daily and tolerating it well.  She will continue this.  She was given bone health recommendations today.    She is overdue for her mammogram.  I ordered this.  She will alternate her mammogram with breast MRI every 6 months.    I reviewed recommendations for healthy diet and exercise today.    Adilenne will see Dr. Jana Hakim in 04/2019 for labs and f/u.  She knows to call for any questions or concerns prior to her next appt with [place].    A total of (30) minutes of face-to-face time was spent with this patient with greater than 50% of that time in counseling and care-coordination.     Wilber Bihari, NP  09/28/18 9:38 AM Medical Oncology and Hematology [facility] [address] [place], [place] [address] Tel. [phone]    Fax. [phone]

## 2018-09-29 ENCOUNTER — Telehealth: Payer: Self-pay | Admitting: Adult Health

## 2018-09-29 NOTE — Telephone Encounter (Signed)
Per 1/14 no los °

## 2018-10-07 DIAGNOSIS — K743 Primary biliary cirrhosis: Secondary | ICD-10-CM | POA: Diagnosis not present

## 2018-10-07 DIAGNOSIS — K760 Fatty (change of) liver, not elsewhere classified: Secondary | ICD-10-CM | POA: Diagnosis not present

## 2018-10-25 MED FILL — ANASTROZOLE 1 MG TABLET: 1 | 90 days supply | Qty: 90 | Fill #1

## 2018-11-07 ENCOUNTER — Other Ambulatory Visit: Payer: Self-pay | Admitting: Oncology

## 2018-11-09 ENCOUNTER — Ambulatory Visit
Admission: RE | Admit: 2018-11-09 | Discharge: 2018-11-09 | Disposition: A | Discharge status: Home / Self Care | Payer: PPO | Source: Ambulatory Visit | Attending: Adult Health | Admitting: Adult Health

## 2018-11-09 DIAGNOSIS — C50412 Malignant neoplasm of upper-outer quadrant of left female breast: Secondary | ICD-10-CM

## 2018-11-09 DIAGNOSIS — R928 Other abnormal and inconclusive findings on diagnostic imaging of breast: Secondary | ICD-10-CM | POA: Diagnosis not present

## 2018-11-09 DIAGNOSIS — Z171 Estrogen receptor negative status [ER-]: Principal | ICD-10-CM

## 2018-11-11 ENCOUNTER — Ambulatory Visit (INDEPENDENT_AMBULATORY_CARE_PROVIDER_SITE_OTHER): Payer: PPO

## 2018-11-11 ENCOUNTER — Ambulatory Visit (INDEPENDENT_AMBULATORY_CARE_PROVIDER_SITE_OTHER): Payer: PPO | Admitting: Nurse Practitioner

## 2018-11-11 ENCOUNTER — Encounter: Payer: Self-pay | Admitting: Nurse Practitioner

## 2018-11-11 VITALS — BP 122/82 | HR 75 | Temp 98.1°F | Wt 244.8 lb

## 2018-11-11 VITALS — BP 122/82 | HR 62 | Temp 98.1°F | Ht 62.0 in | Wt 244.0 lb

## 2018-11-11 DIAGNOSIS — Z0000 Encounter for general adult medical examination without abnormal findings: Secondary | ICD-10-CM | POA: Diagnosis not present

## 2018-11-11 DIAGNOSIS — Z171 Estrogen receptor negative status [ER-]: Secondary | ICD-10-CM

## 2018-11-11 DIAGNOSIS — C50412 Malignant neoplasm of upper-outer quadrant of left female breast: Secondary | ICD-10-CM | POA: Diagnosis not present

## 2018-11-11 DIAGNOSIS — Z1159 Encounter for screening for other viral diseases: Secondary | ICD-10-CM

## 2018-11-11 DIAGNOSIS — M79604 Pain in right leg: Secondary | ICD-10-CM

## 2018-11-11 DIAGNOSIS — R0600 Dyspnea, unspecified: Secondary | ICD-10-CM | POA: Insufficient documentation

## 2018-11-11 DIAGNOSIS — R0609 Other forms of dyspnea: Secondary | ICD-10-CM | POA: Insufficient documentation

## 2018-11-11 NOTE — Patient Instructions (Signed)
[name] , Thank you for taking time to come for your Medicare Wellness Visit. I appreciate your ongoing commitment to your health goals. Please review the following plan we discussed and let me know if I can assist you in the future.   Screening recommendations/referrals: Colonoscopy: 09/2014 Mammogram: 10/2018 Bone Density: 12/2017 Recommended yearly ophthalmology/optometry visit for glaucoma screening and checkup Recommended yearly dental visit for hygiene and checkup  Vaccinations: Influenza vaccine: decline Pneumococcal vaccine: decline Tdap vaccine: decline Shingles vaccine: decline    Advanced directives: Advance directive discussed with you today. Even though you declined this today please call our office should you change your mind and we can give you the proper paperwork for you to fill out.   Conditions/risks identified: Obesity  Next appointment: 05/12/2019 at 10:15   Preventive Care 65 Years and Older, Female Preventive care refers to lifestyle choices and visits with your health care provider that can promote health and wellness. What does preventive care include?  A yearly physical exam. This is also called an annual well check.  Dental exams once or twice a year.  Routine eye exams. Ask your health care provider how often you should have your eyes checked.  Personal lifestyle choices, including:  Daily care of your teeth and gums.  Regular physical activity.  Eating a healthy diet.  Avoiding tobacco and drug use.  Limiting alcohol use.  Practicing safe sex.  Taking low-dose aspirin every day.  Taking vitamin and mineral supplements as recommended by your health care provider. What happens during an annual well check? The services and screenings done by your health care provider during your annual well check will depend on your age, overall health, lifestyle risk factors, and family history of disease. Counseling  Your health care provider may ask  you questions about your:  Alcohol use.  Tobacco use.  Drug use.  Emotional well-being.  Home and relationship well-being.  Sexual activity.  Eating habits.  History of falls.  Memory and ability to understand (cognition).  Work and work Statistician.  Reproductive health. Screening  You may have the following tests or measurements:  Height, weight, and BMI.  Blood pressure.  Lipid and cholesterol levels. These may be checked every 5 years, or more frequently if you are over 47 years old.  Skin check.  Lung cancer screening. You may have this screening every year starting at age 36 if you have a 30-pack-year history of smoking and currently smoke or have quit within the past 15 years.  Fecal occult blood test (FOBT) of the stool. You may have this test every year starting at age 20.  Flexible sigmoidoscopy or colonoscopy. You may have a sigmoidoscopy every 5 years or a colonoscopy every 10 years starting at age 46.  Hepatitis C blood test.  Hepatitis B blood test.  Sexually transmitted disease (STD) testing.  Diabetes screening. This is done by checking your blood sugar (glucose) after you have not eaten for a while (fasting). You may have this done every 1-3 years.  Bone density scan. This is done to screen for osteoporosis. You may have this done starting at age 36.  Mammogram. This may be done every 1-2 years. Talk to your health care provider about how often you should have regular mammograms. Talk with your health care provider about your test results, treatment options, and if necessary, the need for more tests. Vaccines  Your health care provider may recommend certain vaccines, such as:  Influenza vaccine. This is recommended every  year.  Tetanus, diphtheria, and acellular pertussis (Tdap, Td) vaccine. You may need a Td booster every 10 years.  Zoster vaccine. You may need this after age 37.  Pneumococcal 13-valent conjugate (PCV13) vaccine. One dose  is recommended after age 67.  Pneumococcal polysaccharide (PPSV23) vaccine. One dose is recommended after age 71. Talk to your health care provider about which screenings and vaccines you need and how often you need them. This information is not intended to replace advice given to you by your health care provider. Make sure you discuss any questions you have with your health care provider. Document Released: 09/28/2015 Document Revised: 05/21/2016 Document Reviewed: 07/03/2015 Elsevier Interactive Patient Education  2017 [employer] Prevention in the Home Falls can cause injuries. They can happen to people of all ages. There are many things you can do to make your home safe and to help prevent falls. What can I do on the outside of my home?  Regularly fix the edges of walkways and driveways and fix any cracks.  Remove anything that might make you trip as you walk through a door, such as a raised step or threshold.  Trim any bushes or trees on the path to your home.  Use bright outdoor lighting.  Clear any walking paths of anything that might make someone trip, such as rocks or tools.  Regularly check to see if handrails are loose or broken. Make sure that both sides of any steps have handrails.  Any raised decks and porches should have guardrails on the edges.  Have any leaves, snow, or ice cleared regularly.  Use sand or salt on walking paths during winter.  Clean up any spills in your garage right away. This includes oil or grease spills. What can I do in the bathroom?  Use night lights.  Install grab bars by the toilet and in the tub and shower. Do not use towel bars as grab bars.  Use non-skid mats or decals in the tub or shower.  If you need to sit down in the shower, use a plastic, non-slip stool.  Keep the floor dry. Clean up any water that spills on the floor as soon as it happens.  Remove soap buildup in the tub or shower regularly.  Attach bath mats  securely with double-sided non-slip rug tape.  Do not have throw rugs and other things on the floor that can make you trip. What can I do in the bedroom?  Use night lights.  Make sure that you have a light by your bed that is easy to reach.  Do not use any sheets or blankets that are too big for your bed. They should not hang down onto the floor.  Have a firm chair that has side arms. You can use this for support while you get dressed.  Do not have throw rugs and other things on the floor that can make you trip. What can I do in the kitchen?  Clean up any spills right away.  Avoid walking on wet floors.  Keep items that you use a lot in easy-to-reach places.  If you need to reach something above you, use a strong step stool that has a grab bar.  Keep electrical cords out of the way.  Do not use floor polish or wax that makes floors slippery. If you must use wax, use non-skid floor wax.  Do not have throw rugs and other things on the floor that can make you trip. What can  I do with my stairs?  Do not leave any items on the stairs.  Make sure that there are handrails on both sides of the stairs and use them. Fix handrails that are broken or loose. Make sure that handrails are as long as the stairways.  Check any carpeting to make sure that it is firmly attached to the stairs. Fix any carpet that is loose or worn.  Avoid having throw rugs at the top or bottom of the stairs. If you do have throw rugs, attach them to the floor with carpet tape.  Make sure that you have a light switch at the top of the stairs and the bottom of the stairs. If you do not have them, ask someone to add them for you. What else can I do to help prevent falls?  Wear shoes that:  Do not have high heels.  Have rubber bottoms.  Are comfortable and fit you well.  Are closed at the toe. Do not wear sandals.  If you use a stepladder:  Make sure that it is fully opened. Do not climb a closed  stepladder.  Make sure that both sides of the stepladder are locked into place.  Ask someone to hold it for you, if possible.  Clearly mark and make sure that you can see:  Any grab bars or handrails.  First and last steps.  Where the edge of each step is.  Use tools that help you move around (mobility aids) if they are needed. These include:  Canes.  Walkers.  Scooters.  Crutches.  Turn on the lights when you go into a dark area. Replace any light bulbs as soon as they burn out.  Set up your furniture so you have a clear path. Avoid moving your furniture around.  If any of your floors are uneven, fix them.  If there are any pets around you, be aware of where they are.  Review your medicines with your doctor. Some medicines can make you feel dizzy. This can increase your chance of falling. Ask your doctor what other things that you can do to help prevent falls. This information is not intended to replace advice given to you by your health care provider. Make sure you discuss any questions you have with your health care provider. Document Released: 06/28/2009 Document Revised: 02/07/2016 Document Reviewed: 10/06/2014 Elsevier Interactive Patient Education  2017 [employer].

## 2018-11-11 NOTE — Progress Notes (Addendum)
Subjective:     [name] , female    DOB: 1951-02-11 , 68 y.o.   MRN: [id number]   Chief Complaint  Patient presents with  . other    leg pain in righ leg/ sob     HPI  The patient states she uses status post hysterectomy for birth control. Last LMP was No LMP recorded. Patient has had a hysterectomy.. Negative for Dysmenorrhea and Negative for Menorrhagia Mammogram last done 11/09/2018 Negative for: breast discharge, breast lump(s), breast pain and breast self exam.  Pertinent negatives include abnormal bleeding (hematology), anxiety, decreased libido, depression, difficulty falling sleep, dyspareunia, history of infertility, nocturia, sexual dysfunction, sleep disturbances, urinary incontinence, urinary urgency, vaginal discharge and vaginal itching. Diet regular.The patient states her exercise level is  minimal   . The patient's tobacco use is:  Social History   Tobacco Use  Smoking Status Never Smoker  Smokeless Tobacco Never Used  . She has been exposed to passive smoke. The patient's alcohol use is:  Social History   Substance and Sexual Activity  Alcohol Use Yes   Comment: occ-rare   Additional information:   She is being treated for breast cancer by Dr. Curlene Labrum, seen him last in October 2019.  She is having fingers and toes numbness.    Leg Pain   The incident occurred more than 1 week ago. There was no injury mechanism. The pain is present in the right leg. The pain is mild. She has tried acetaminophen for the symptoms. The treatment provided mild relief.     Past Medical History:  Diagnosis Date  . Colon cancer ([place])   . Hx of rotator cuff surgery   . left breast cancer   . Personal history of chemotherapy 2016   left  . Personal history of radiation therapy 2016   left breast   . Radiation 11/15/14-01/02/15   left breast, axillary and supraclavicular region 45 gray, lumpectomy cavity boosted to 16 gray     Family History  Problem  Relation Age of Onset  . Cancer Mother 70       breast  . Breast cancer Mother        early 42s  . Stroke Father 72       cause of death  . Diabetes Father   . Cancer Maternal Aunt        breast cancer at unknown age  . Breast cancer Maternal Aunt   . Ovarian cancer Neg Hx      Current Outpatient Medications:  .  acetaminophen (TYLENOL) 500 MG tablet, Take 1,000 mg by mouth every 6 (six) hours as needed for mild pain or moderate pain., Disp: , Rfl:  .  anastrozole (ARIMIDEX) 1 MG tablet, Take 1 tablet (1 mg total) by mouth daily., Disp: 90 tablet, Rfl: 4 .  gabapentin (NEURONTIN) 300 MG capsule, TAKE 1 CAPSULE BY MOUTH EVERYDAY AT BEDTIME, Disp: 90 capsule, Rfl: 0 .  potassium chloride (K-DUR) 10 MEQ tablet, Take 1 tablet (10 mEq total) by mouth 2 (two) times daily., Disp: 60 tablet, Rfl: 4 .  ursodiol (ACTIGALL) 500 MG tablet, Take 500 mg by mouth 3 (three) times daily., Disp: , Rfl:  .  loteprednol (LOTEMAX) 0.5 % ophthalmic suspension, Place 1 drop into both eyes 3 (three) times daily. Pt use this medication as needed., Disp: , Rfl:    No Known Allergies   Review of Systems  Constitutional: Negative.  Negative for fatigue.  HENT: Negative.  Eyes: Negative.   Respiratory: Negative.  Negative for cough.   Cardiovascular: Negative.  Negative for chest pain, palpitations and leg swelling.  Gastrointestinal: Negative.   Endocrine: Negative.  Negative for polydipsia, polyphagia and polyuria.  Genitourinary: Negative.   Musculoskeletal:       Bilateral lower extremity pain  Skin: Negative.   Allergic/Immunologic: Negative.   Neurological: Negative.  Negative for dizziness and headaches.  Hematological: Negative.   Psychiatric/Behavioral: Negative.      Today's Vitals   11/11/18 1140  BP: 122/82  Pulse: 75  Temp: 98.1 F (36.7 C)  SpO2: 98%  Weight: 244 lb 12.8 oz (111 kg)   Body mass index is 44.77 kg/m.   Objective:  Physical Exam Constitutional:       Appearance: Normal appearance.  HENT:     Head: Normocephalic and atraumatic.     Right Ear: Tympanic membrane, ear canal and external ear normal. There is no impacted cerumen.     Left Ear: Tympanic membrane, ear canal and external ear normal. There is no impacted cerumen.     Nose: Nose normal.     Mouth/Throat:     Mouth: Mucous membranes are moist.  Eyes:     Conjunctiva/sclera: Conjunctivae normal.     Pupils: Pupils are equal, round, and reactive to light.  Cardiovascular:     Rate and Rhythm: Normal rate and regular rhythm.     Pulses: Normal pulses.     Heart sounds: Normal heart sounds. No murmur.  Pulmonary:     Effort: Pulmonary effort is normal. No respiratory distress.     Breath sounds: Normal breath sounds. No wheezing or rales.  Abdominal:     General: Bowel sounds are normal.     Palpations: Abdomen is soft.  Skin:    General: Skin is warm and dry.     Capillary Refill: Capillary refill takes less than 2 seconds.  Neurological:     General: No focal deficit present.     Mental Status: She is alert and oriented to person, place, and time.  Psychiatric:        Mood and Affect: Mood normal.        Behavior: Behavior normal.        Thought Content: Thought content normal.        Judgment: Judgment normal.         Assessment And Plan:     1. Malignant neoplasm of upper-outer quadrant of left breast in female, estrogen receptor negative ([place])  Being followed by Dr. Curlene Labrum  Currently taking oral chemo for the next 5 years  2. Dyspnea on exertion  No abnormal breath sounds  Likely related to deconditioning vs radiation  Encouraged to increase her activity slowly if worsens she is to return call to office  Offered to refer to PT however she would like to wait  3. Right leg pain  Intermittent right leg pain however tolerable at this time  4. Encounter for hepatitis C screening test for low risk patient  Will check for Hepatitis C screening due to  being born between the years 60-1965 - Hepatitis C antibody  5. Health maintenance examination . Behavior modifications discussed and diet history reviewed.   . Pt will continue to exercise regularly and modify diet with low GI, plant based foods and decrease intake of processed foods.  . Recommend intake of daily multivitamin, Vitamin D, and calcium.  . Recommend mammogram (up to date) for preventive screenings, as well as  recommend immunizations that include influenza (declined), TDAP, and Shingles (declined). She is no longer having PAPs done       Minette Brine, FNP

## 2018-11-11 NOTE — Progress Notes (Signed)
Subjective:   [name] is a 68 y.o. female who presents for Medicare Annual (Subsequent) preventive examination.  Review of Systems:  n/a Cardiac Risk Factors include: advanced age (>31men, >18 women);obesity (BMI >30kg/m2);sedentary lifestyle     Objective:     Vitals: BP 122/82 (Patient Position: Sitting)   Pulse 62   Temp 98.1 F (36.7 C) (Oral)   Ht 5\' 2"  (1.575 m)   Wt 244 lb (110.7 kg)   BMI 44.63 kg/m   Body mass index is 44.63 kg/m.  Advanced Directives 11/11/2018 12/21/2017 12/15/2017 04/24/2016 10/11/2015 10/29/2014 10/13/2014  Does Patient Have a Medical Advance Directive? No Yes No No No No Yes  Type of Advance Directive - Living will - - - - -  Does patient want to make changes to medical advance directive? - No - Patient declined - - - - -  Copy of [place] in Chart? - - - - - - -  Would patient like information on creating a medical advance directive? No - Patient declined - - - - - -    Tobacco Social History   Tobacco Use  Smoking Status Never Smoker  Smokeless Tobacco Never Used     Counseling given: Not Answered   Clinical Intake:  Pre-visit preparation completed: Yes  Pain : No/denies pain Pain Score: 0-No pain     Nutritional Status: BMI > 30  Obese Nutritional Risks: None Diabetes: No  How often do you need to have someone help you when you read instructions, pamphlets, or other written materials from your doctor or pharmacy?: 1 - Never What is the last grade level you completed in school?: 12th grade  Interpreter Needed?: No  Information entered by :: NAllen LPN  Past Medical History:  Diagnosis Date  . Colon cancer ([place])   . Hx of rotator cuff surgery   . left breast cancer   . Personal history of chemotherapy 2016   left  . Personal history of radiation therapy 2016   left breast   . Radiation 11/15/14-01/02/15   left breast, axillary and supraclavicular region 45 gray, lumpectomy cavity boosted to  16 gray   Past Surgical History:  Procedure Laterality Date  . ABDOMINAL HYSTERECTOMY  1988  . BREAST EXCISIONAL BIOPSY Right 12/21/2017  . BREAST LUMPECTOMY Left 2016  . COLON SURGERY  1988   colectomy/colostomy-ca  . COLON SURGERY  1988   colostomy takedown-reversal  . COLONOSCOPY    . EXCISION / BIOPSY BREAST / NIPPLE / DUCT Left 09/20/2014  . RADIOACTIVE SEED GUIDED EXCISIONAL BREAST BIOPSY Right 12/21/2017   Procedure: RIGHT RADIOACTIVE SEED GUIDED EXCISIONAL BREAST BIOPSY ERAS PATHWAY;  Surgeon: Rolm Bookbinder, MD;  Location: [facility];  Service: General;  Laterality: Right;  . RADIOACTIVE SEED GUIDED PARTIAL MASTECTOMY WITH AXILLARY SENTINEL LYMPH NODE BIOPSY Left 09/20/2014   Procedure: RADIOACTIVE SEED GUIDED LEFT BREAST LUMPECTOMY WITH AXILLARY SENTINEL LYMPH NODE BIOPSY;  Surgeon: Rolm Bookbinder, MD;  Location: [facility];  Service: General;  Laterality: Left;  . TOTAL SHOULDER ARTHROPLASTY  2009   right   Family History  Problem Relation Age of Onset  . Cancer Mother 25       breast  . Breast cancer Mother        early 41s  . Stroke Father 31       cause of death  . Diabetes Father   . Cancer Maternal Aunt        breast  cancer at unknown age  . Breast cancer Maternal Aunt   . Ovarian cancer Neg Hx    Social History   Socioeconomic History  . Marital status: Divorced    Spouse name: Not on file  . Number of children: 2  . Years of education: Not on file  . Highest education level: Not on file  Occupational History    Employer: [employer]    Comment: retired in 2008  Social Needs  . Financial resource strain: Not hard at all  . Food insecurity:    Worry: Never true    Inability: Never true  . Transportation needs:    Medical: No    Non-medical: No  Tobacco Use  . Smoking status: Never Smoker  . Smokeless tobacco: Never Used  Substance and Sexual Activity  . Alcohol use: Yes    Comment: occ-rare  . Drug use: No  .  Sexual activity: Not Currently  Lifestyle  . Physical activity:    Days per week: 0 days    Minutes per session: 0 min  . Stress: Not on file  Relationships  . Social connections:    Talks on phone: Not on file    Gets together: Not on file    Attends religious service: Not on file    Active member of club or organization: Not on file    Attends meetings of clubs or organizations: Not on file    Relationship status: Not on file  Other Topics Concern  . Not on file  Social History Narrative  . Not on file    Outpatient Encounter Medications as of 11/11/2018  Medication Sig  . acetaminophen (TYLENOL) 500 MG tablet Take 1,000 mg by mouth every 6 (six) hours as needed for mild pain or moderate pain.  Marland Kitchen anastrozole (ARIMIDEX) 1 MG tablet Take 1 tablet (1 mg total) by mouth daily.  Marland Kitchen gabapentin (NEURONTIN) 300 MG capsule TAKE 1 CAPSULE BY MOUTH EVERYDAY AT BEDTIME  . loteprednol (LOTEMAX) 0.5 % ophthalmic suspension Place 1 drop into both eyes 3 (three) times daily. Pt use this medication as needed.  . potassium chloride (K-DUR) 10 MEQ tablet Take 1 tablet (10 mEq total) by mouth 2 (two) times daily.  . ursodiol (ACTIGALL) 500 MG tablet Take 500 mg by mouth 3 (three) times daily.   No facility-administered encounter medications on file as of 11/11/2018.     Activities of Daily Living In your present state of health, do you have any difficulty performing the following activities: 11/11/2018 12/21/2017  Hearing? N N  Vision? N N  Difficulty concentrating or making decisions? N N  Walking or climbing stairs? N N  Dressing or bathing? N N  Doing errands, shopping? N -  Preparing Food and eating ? N -  Using the Toilet? N -  In the past six months, have you accidently leaked urine? N -  Do you have problems with loss of bowel control? N -  Managing your Medications? N -  Managing your Finances? N -  Housekeeping or managing your Housekeeping? N -  Some recent data might be hidden     Patient Care Team: Minette Brine, FNP as PCP - General (General Practice) Magrinat, Virgie Dad, MD as Consulting Physician (Oncology) Rolm Bookbinder, MD as Consulting Physician (General Surgery) Gery Pray, MD as Consulting Physician (Radiation Oncology) [name], MD as Consulting Physician (Ophthalmology)    Assessment:   This is a routine wellness examination for [place].  Exercise Activities and  Dietary recommendations Current Exercise Habits: The patient does not participate in regular exercise at present, Exercise limited by: None identified  Goals    . Patient Stated (pt-stated)     No goals       Fall Risk Fall Risk  11/11/2018 08/05/2018 04/06/2017 10/04/2014 07/10/2014  Falls in the past year? 0 0 No No No  Comment - Emmi Telephone Survey: data to providers prior to load Emmi Telephone Survey: data to providers prior to load - -  Risk for fall due to : Medication side effect - - - -  Follow up Education provided;Falls prevention discussed - - - -   Is the patient's home free of loose throw rugs in walkways, pet beds, electrical cords, etc?   yes      Grab bars in the bathroom? yes      Handrails on the stairs?   yes      Adequate lighting?   yes  Timed Get Up and Go performed: n/a  Depression Screen PHQ 2/9 Scores 11/11/2018 10/04/2014 07/10/2014  PHQ - 2 Score 0 0 0  PHQ- 9 Score 3 - -     Cognitive Function     6CIT Screen 11/11/2018  What Year? 0 points  What month? 0 points  What time? 0 points  Count back from 20 0 points  Months in reverse 0 points  Repeat phrase 0 points  Total Score 0     There is no immunization history on file for this patient.  Qualifies for Shingles Vaccine? yes  Screening Tests Health Maintenance  Topic Date Due  . Hepatitis C Screening  1951/03/12  . INFLUENZA VACCINE  06/15/2019 (Originally 04/15/2018)  . PNA vac Low Risk Adult (1 of 2 - PCV13) 11/12/2019 (Originally 01/08/2016)  . MAMMOGRAM  11/09/2020  .  COLONOSCOPY  08/13/2028  . TETANUS/TDAP  11/11/2028  . DEXA SCAN  Completed    Cancer Screenings: Lung: Low Dose CT Chest recommended if Age 74-80 years, 30 pack-year currently smoking OR have quit w/in 15years. Patient does not qualify. Breast:  Up to date on Mammogram? Yes   Up to date of Bone Density/Dexa? Yes Colorectal: up to date  Additional Screenings: : Hepatitis C Screening: ordered     Plan:   Declines vaccinations  I have personally reviewed and noted the following in the patient's chart:   . Medical and social history . Use of alcohol, tobacco or illicit drugs  . Current medications and supplements . Functional ability and status . Nutritional status . Physical activity . Advanced directives . List of other physicians . Hospitalizations, surgeries, and ER visits in previous 12 months . Vitals . Screenings to include cognitive, depression, and falls . Referrals and appointments  In addition, I have reviewed and discussed with patient certain preventive protocols, quality metrics, and best practice recommendations. A written personalized care plan for preventive services as well as general preventive health recommendations were provided to patient.     Kellie Simmering, LPN  [id number]

## 2018-11-12 LAB — HEPATITIS C ANTIBODY: Hep C Virus Ab: <0.1 s/co ratio (ref 0.0–0.9)

## 2018-11-23 DIAGNOSIS — R748 Abnormal levels of other serum enzymes: Secondary | ICD-10-CM | POA: Diagnosis not present

## 2018-11-23 DIAGNOSIS — K760 Fatty (change of) liver, not elsewhere classified: Secondary | ICD-10-CM | POA: Diagnosis not present

## 2019-01-27 MED FILL — ANASTROZOLE 1 MG TABLET: 1 | 90 days supply | Qty: 90 | Fill #2

## 2019-02-12 ENCOUNTER — Other Ambulatory Visit: Payer: Self-pay | Admitting: Oncology

## 2019-03-29 ENCOUNTER — Ambulatory Visit
Admission: RE | Admit: 2019-03-29 | Discharge: 2019-03-29 | Disposition: A | Discharge status: Home / Self Care | Payer: PPO | Source: Ambulatory Visit | Attending: Oncology | Admitting: Oncology

## 2019-03-29 ENCOUNTER — Other Ambulatory Visit: Payer: Self-pay | Admitting: Oncology

## 2019-03-29 ENCOUNTER — Other Ambulatory Visit: Payer: Self-pay

## 2019-03-29 DIAGNOSIS — C50412 Malignant neoplasm of upper-outer quadrant of left female breast: Secondary | ICD-10-CM

## 2019-03-29 DIAGNOSIS — C50919 Malignant neoplasm of unspecified site of unspecified female breast: Secondary | ICD-10-CM

## 2019-03-29 DIAGNOSIS — Z171 Estrogen receptor negative status [ER-]: Secondary | ICD-10-CM

## 2019-03-29 DIAGNOSIS — N6489 Other specified disorders of breast: Secondary | ICD-10-CM | POA: Diagnosis not present

## 2019-03-29 DIAGNOSIS — Z1589 Genetic susceptibility to other disease: Secondary | ICD-10-CM

## 2019-03-29 DIAGNOSIS — C189 Malignant neoplasm of colon, unspecified: Secondary | ICD-10-CM

## 2019-03-29 DIAGNOSIS — Z9012 Acquired absence of left breast and nipple: Secondary | ICD-10-CM | POA: Diagnosis not present

## 2019-03-29 MED ORDER — GADOBUTROL 1 MMOL/ML IV SOLN
10.0000 mL | Freq: Once | INTRAVENOUS | Status: AC | PRN
  Administered 2019-03-29: 10 mL via INTRAVENOUS

## 2019-03-29 NOTE — Progress Notes (Signed)
I called the patient with the results of today's MRI and I set [name] up for the studies suggested by Dr. Rosana Hoes.

## 2019-03-30 ENCOUNTER — Other Ambulatory Visit: Payer: Self-pay | Admitting: Oncology

## 2019-04-04 ENCOUNTER — Other Ambulatory Visit: Payer: Self-pay | Admitting: Oncology

## 2019-04-04 DIAGNOSIS — C50919 Malignant neoplasm of unspecified site of unspecified female breast: Secondary | ICD-10-CM

## 2019-04-04 DIAGNOSIS — Z1589 Genetic susceptibility to other disease: Secondary | ICD-10-CM

## 2019-04-04 DIAGNOSIS — C50412 Malignant neoplasm of upper-outer quadrant of left female breast: Secondary | ICD-10-CM

## 2019-04-04 DIAGNOSIS — Z1502 Genetic susceptibility to malignant neoplasm of ovary: Secondary | ICD-10-CM

## 2019-04-07 ENCOUNTER — Ambulatory Visit
Admission: RE | Admit: 2019-04-07 | Discharge: 2019-04-07 | Disposition: A | Discharge status: Home / Self Care | Payer: PPO | Source: Ambulatory Visit | Attending: Oncology | Admitting: Oncology

## 2019-04-07 ENCOUNTER — Other Ambulatory Visit: Payer: Self-pay

## 2019-04-07 DIAGNOSIS — Z1509 Genetic susceptibility to other malignant neoplasm: Secondary | ICD-10-CM

## 2019-04-07 DIAGNOSIS — C50412 Malignant neoplasm of upper-outer quadrant of left female breast: Secondary | ICD-10-CM

## 2019-04-07 DIAGNOSIS — Z1589 Genetic susceptibility to other disease: Secondary | ICD-10-CM

## 2019-04-07 DIAGNOSIS — C50919 Malignant neoplasm of unspecified site of unspecified female breast: Secondary | ICD-10-CM

## 2019-04-07 DIAGNOSIS — N6489 Other specified disorders of breast: Secondary | ICD-10-CM | POA: Diagnosis not present

## 2019-04-07 DIAGNOSIS — R928 Other abnormal and inconclusive findings on diagnostic imaging of breast: Secondary | ICD-10-CM | POA: Diagnosis not present

## 2019-04-07 DIAGNOSIS — N6320 Unspecified lump in the left breast, unspecified quadrant: Secondary | ICD-10-CM | POA: Diagnosis not present

## 2019-04-07 MED ORDER — GADOBUTROL 1 MMOL/ML IV SOLN
10.0000 mL | Freq: Once | INTRAVENOUS | Status: AC | PRN
  Administered 2019-04-07: 10 mL via INTRAVENOUS

## 2019-04-12 ENCOUNTER — Other Ambulatory Visit: Payer: Self-pay | Admitting: Oncology

## 2019-04-14 ENCOUNTER — Other Ambulatory Visit: Payer: PPO

## 2019-05-09 ENCOUNTER — Other Ambulatory Visit: Payer: Self-pay | Admitting: Oncology

## 2019-05-09 NOTE — Progress Notes (Signed)
[place]  Telephone:([phone]) [phone] Fax:(336) [phone]     ID: [name] DOB: Feb 28, 1951  MR#: [id number]  JYN#:[id number]  Patient Care Team: Glendale Chard, MD as PCP - General (Internal Medicine) Magrinat, Virgie Dad, MD as Consulting Physician (Oncology) Rolm Bookbinder, MD as Consulting Physician (General Surgery) Gery Pray, MD as Consulting Physician (Radiation Oncology) [name], MD as Consulting Physician (Ophthalmology)   CHIEF COMPLAINT: Stage III breast cancer, triple negative, in patient with high risk mutation  CURRENT TREATMENT: Intensified screening, anastrozole   BREAST CANCER HISTORY: From the original intake note:  [name] herself palpated a mass in her left breast, and immediately brought it to the attention of her primary care physician, Dr. Baird Cancer, who confirmed the finding and setup the patient for bilateral diagnostic mammography at the [facility] Center 02/20/2014. This showed a high density mass in the outer left breast measuring up to 3.7 cm. It corresponded to the site of palpable concern. In addition the normal 4 logically abnormal lymph nodes in the left axilla. The mass was palpable by exam, and by ultrasonography measured 2.6 cm. There were enlarged and morphologically abnormal lymph nodes in the left axilla, largest measuring 3 cm.  Biopsy of the left breast mass in question and 1 of the abnormal axillary lymph nodes 02/22/2014 showed ([id number]) biopsies to be positive for an invasive ductal carcinoma, grade 3, triple negative, with an MIB-1 of 90%. Specifically the HER-2 signals ratio was 1.05, and the number per cell 2.00.  MRI of the breasts 03/01/2014 showed a 3.8 cm enhancing mass with areas of apparent necrosis in the left breast, as well as the biopsy tract extending laterally, the combined measure 5.3 cm. There were no other masses or areas of abnormal enhancement. There were multiple abnormally enlarged left axillary  lymph nodes with loss of the normal fatty hilum. These included level I and retropectoral lymph nodes.  The patient's subsequent history is as detailed below    INTERVAL HISTORY: [name] returns today for follow-up and treatment of her estrogen receptor negative breast cancer. She also carries a PALB2 mutation, which puts her at risk of further breast cancer developing as well as other cancers that we have no screening options for. She was last seen here on 09/28/2018.   She continues on anastrozole.  She tolerates this well, with no unusual side effects.  Hot flashes and vaginal dryness are not major issues.  [name]'s last bone density screening on 12/31/2017, showed a T-score of -0.6, which is considered normal.    Since her last visit here, she underwent a digital diagnostic bilateral mammogram with tomography on 11/09/2018 showing: Breast Density Category B. There is no mammographic evidence of malignancy.   She also underwent a breast MRI on 03/29/2019 showing: Suspicious 1.8 cm area of linear non mass enhancement within the left breast. Probable mildly thickened low right axillary lymph node.  She then underwent a right axilla ultrasound on 04/07/2019 showing: No lymphadenopathy is identified in the inferior right axilla.  Finally, she underwent a left breast biopsy on 04/11/2019. The pathology from this procedure showed ([id number]): Breast, left, needle core biopsy, superior - sclerotic fibroadenomatoid nodule with calcifications - no malignancy identified   - see comment: Smm, p63 and calponin supports the presence of a myoepithelial layer. The cells are positive for e-cadherin supporting Ductal origin with some degenerative changes. D2-40 immunohistochemistry does not support the presence of Lymphovascular space invasion.    REVIEW OF SYSTEMS: [name] tells me her legs  are "not so good in the morning" but she says she is doing some walking for exercise.  She says she checks her blood  pressure at home and it is "okay".  She does not recall a number however.  Blood pressure was high here today.  She tells me she is not particularly stressed.  She did well with her recent biopsy.  She has had no unusual headaches visual changes cough phlegm production pleurisy or shortness of breath or change in bowel or bladder habits.  A detailed review of systems today was otherwise stable.   PAST MEDICAL HISTORY: Past Medical History:  Diagnosis Date  . Colon cancer ([place])   . Hx of rotator cuff surgery   . left breast cancer   . Personal history of chemotherapy 2016   left  . Personal history of radiation therapy 2016   left breast   . Radiation 11/15/14-01/02/15   left breast, axillary and supraclavicular region 45 gray, lumpectomy cavity boosted to 16 gray    PAST SURGICAL HISTORY: Past Surgical History:  Procedure Laterality Date  . ABDOMINAL HYSTERECTOMY  1988  . BREAST EXCISIONAL BIOPSY Right 12/21/2017  . BREAST LUMPECTOMY Left 2016  . COLON SURGERY  1988   colectomy/colostomy-ca  . COLON SURGERY  1988   colostomy takedown-reversal  . COLONOSCOPY    . EXCISION / BIOPSY BREAST / NIPPLE / DUCT Left 09/20/2014  . RADIOACTIVE SEED GUIDED EXCISIONAL BREAST BIOPSY Right 12/21/2017   Procedure: RIGHT RADIOACTIVE SEED GUIDED EXCISIONAL BREAST BIOPSY ERAS PATHWAY;  Surgeon: Rolm Bookbinder, MD;  Location: [place] [facility];  Service: General;  Laterality: Right;  . RADIOACTIVE SEED GUIDED PARTIAL MASTECTOMY WITH AXILLARY SENTINEL LYMPH NODE BIOPSY Left 09/20/2014   Procedure: RADIOACTIVE SEED GUIDED LEFT BREAST LUMPECTOMY WITH AXILLARY SENTINEL LYMPH NODE BIOPSY;  Surgeon: Rolm Bookbinder, MD;  Location: [facility];  Service: General;  Laterality: Left;  . TOTAL SHOULDER ARTHROPLASTY  2009   right    FAMILY HISTORY Family History  Problem Relation Age of Onset  . Cancer Mother 12       breast  . Breast cancer Mother        early 30s  . Stroke Father  45       cause of death  . Diabetes Father   . Cancer Maternal Aunt        breast cancer at unknown age  . Breast cancer Maternal Aunt   . Ovarian cancer Neg Hx    the patient's father died at the age of 20 following a stroke in the setting of diabetes; the patient's mother is living at 68. The patient has 3 brothers and 2 sisters. [name], [name], was diagnosed with breast cancer in her early 30s. There is no other breast or ovarian cancer in the family. The patient herself was diagnosed with watts by history he is an incidentally found stage I colon carcinoma noted at the time of her hysterectomy. She had a partial colectomy but no adjuvant treatment. We have no records regarding that procedure  GYNECOLOGIC HISTORY:  No LMP recorded. Patient has had a hysterectomy. Menarche age 52, first live birth age 50, the patient underwent total abdominal hysterectomy with bilateral salpingo-oophorectomy in her 62s. She did not take hormone replacement. She did not take oral contraceptives at any point.  SOCIAL HISTORY:  [name] worked for [employer] for about 40 years, retiring in 2008. She is divorced and lives alone, with no pets. Her daughter  [name] works for [employer] in Press [employer]. The second daughter, [name], works as a Scientist, water quality at [employer]  The patient has 6 grandchildren and three great-grandchildren. She attends a [employer]. (updated 09/28/2018)    ADVANCED DIRECTIVES: Not in place. The patient intends to name her daughter [name]. her healthcare power of attorney. [name] is work number is [phone]. On the patient's 03/01/2014 visit she was given a copy of the appropriate documents to complete and notarize at her discretion.     HEALTH MAINTENANCE: Social History   Tobacco Use  . Smoking status: Never Smoker  . Smokeless tobacco: Never Used  Substance Use Topics  . Alcohol use: Yes    Comment: occ-rare  . Drug use: No     Colonoscopy:  2000  PAP: May 2015  Bone density: 12/21/2017, T-score of -0.6  Lipid panel: 02/03/2014  No Known Allergies  Current Outpatient Medications  Medication Sig Dispense Refill  . acetaminophen (TYLENOL) 500 MG tablet Take 1,000 mg by mouth every 6 (six) hours as needed for mild pain or moderate pain.    Marland Kitchen anastrozole (ARIMIDEX) 1 MG tablet Take 1 tablet (1 mg total) by mouth daily. 90 tablet 4  . gabapentin (NEURONTIN) 300 MG capsule TAKE 1 CAPSULE BY MOUTH EVERYDAY AT BEDTIME 90 capsule 0  . lisinopril-hydrochlorothiazide (ZESTORETIC) 20-12.5 MG tablet Take 1 tablet by mouth daily. 30 tablet 6  . loteprednol (LOTEMAX) 0.5 % ophthalmic suspension Place 1 drop into both eyes 3 (three) times daily. Pt use this medication as needed.    . potassium chloride (K-DUR) 10 MEQ tablet Take 1 tablet (10 mEq total) by mouth 2 (two) times daily. 60 tablet 4  . ursodiol (ACTIGALL) 500 MG tablet Take 500 mg by mouth 3 (three) times daily.     No current facility-administered medications for this visit.     OBJECTIVE: Morbidly obese African-American woman who appears older than stated age  Vitals:   05/10/19 0933  BP: (!) 159/103  Pulse: 84  Resp: 18  Temp: 97.8 F (36.6 C)  SpO2: 100%    Body mass index is 42.5 kg/m.     ECOG FS:1 - Symptomatic but completely ambulatory  Sclerae unicteric, EOMs intact Wearing a mask No cervical or supraclavicular adenopathy Lungs no rales or rhonchi Heart regular rate and rhythm Abd soft, nontender, positive bowel sounds MSK no focal spinal tenderness, no upper extremity lymphedema Neuro: nonfocal, well oriented, appropriate affect Breasts: Status post right lumpectomy.  There is no evidence of local recurrence.  Left breast is benign.  Both axillae are benign.  LAB RESULTS:  No results found for: SPEP  Lab Results  Component Value Date   WBC 5.3 05/10/2019   NEUTROABS 3.0 05/10/2019   HGB 12.2 05/10/2019   HCT 37.6 05/10/2019   MCV 91.0 05/10/2019    PLT 280 05/10/2019      Chemistry      Component Value Date/Time   NA 143 05/10/2019 0908   NA 142 11/13/2016 1050   K 3.7 05/10/2019 0908   K 4.1 11/13/2016 1050   CL 105 05/10/2019 0908   CO2 28 05/10/2019 0908   CO2 27 11/13/2016 1050   BUN 7 (L) 05/10/2019 0908   BUN 11.5 11/13/2016 1050   CREATININE 0.83 05/10/2019 0908   CREATININE 1.0 11/13/2016 1050      Component Value Date/Time   CALCIUM 9.8 05/10/2019 0908   CALCIUM 9.8 11/13/2016 1050   ALKPHOS 117 05/10/2019 0908  ALKPHOS 140 11/13/2016 1050   AST 12 (L) 05/10/2019 0908   AST 13 11/13/2016 1050   ALT 12 05/10/2019 0908   ALT 15 11/13/2016 1050   BILITOT 1.4 (H) 05/10/2019 0908   BILITOT 0.86 11/13/2016 1050       No results found for: LABCA2  No components found for: [id number]  No results for input(s): INR in the last 168 hours.  Urinalysis    Component Value Date/Time   COLORURINE YELLOW 04/05/2008 1120   APPEARANCEUR CLEAR 04/05/2008 1120   LABSPEC 1.021 04/05/2008 1120   PHURINE 6.0 04/05/2008 1120   GLUCOSEU NEGATIVE 04/05/2008 1120   HGBUR NEGATIVE 04/05/2008 1120   BILIRUBINUR NEGATIVE 04/05/2008 1120   KETONESUR NEGATIVE 04/05/2008 1120   PROTEINUR NEGATIVE 04/05/2008 1120   UROBILINOGEN 0.2 04/05/2008 1120   NITRITE NEGATIVE 04/05/2008 1120   LEUKOCYTESUR  04/05/2008 1120    NEGATIVE MICROSCOPIC NOT DONE ON URINES WITH NEGATIVE PROTEIN, BLOOD, LEUKOCYTES, NITRITE, OR GLUCOSE <1000 mg/dL.    STUDIES: No results found.    ASSESSMENT: 68 y.o. BRCA negative [place] woman s/p Left breast upper outer quadrant and left axillary lymph node biopsy 02/22/2014, both positive for a clinical T2 N2, stage IIIA invasive ductal carcinoma, grade 3, triple negative, with an MIB-1 of 90%  (1) neoadjuvant chemotherapy started 03/13/2014, with cyclophosphamide and doxorubicin in dose dense fashion x4, with Neulasta support on day 2, completed 04/24/2014, followed by weekly carboplatin and paclitaxel  x12, paclitaxel reduced during cycle 5 and cycle 7 because of elevated bilirubin levels  (2) left lumpectomy and sentinel lymph node sampling 09/20/2014 showed a complete pathologic response.  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  (b) PALB2 mutations are known to increase the risk of breast and pancreatic cancer, possibly other cancers, but the data is preliminary  (c) the patient's 2 daughters have been tested and did not carry the gene  (d) Maylynn's MSH2 VUS has been reclassified as "likely benign"  (e) she is status post remote TAH/BSO  (f) intensified screening, with mammography in February and breast MRI in August planned  (6) staging studies showed right-sided lung nodules, too small to characterize, and a dominant right-sided thyroid nodule unchanged in size as compared to 2011; to be followed  (a) chest CT 04/24/2016 showed the small pulmonary nodules to be completely stable, consistent with benign findings.  (b) consider  thyroid ultrasound--done on 07/22/2018 and showed enlarged heterogeneous lobular and multinodular thyroid gland, TI-RADS category 3 nodules, not meeting criteria for further biopsy or dedicated imaging f/u.    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  (8) anastrozole started 07/08/2018 for breast cancer prevention  (a) bone density scan 01/01/2018 normal, T score -0.6  PLAN: Aquilla did very well with the recent biopsy which showed no evidence of cancer.  Unfortunately the intensified screening strategy does result in false positives but  this tends to decrease as we continue.  She will have mammography in February and she will see me again in March.  She will be set up for repeat MRI next August.  Her blood pressure today was elevated again on repeat.  I  am starting her on Zestoretic.  She will check her blood pressure at home and she will follow-up with Dr. Baird Cancer (she already has an appointment later this week).  Kiona knows to call for any other issue that may develop before the next visit.    Magrinat, Virgie Dad, MD  05/10/19 10:09 AM Medical Oncology and Hematology [facility] [address] [place], [place] [address] Tel. [phone]    Fax. [phone]  I, Jacqualyn Posey am acting as a Education administrator for Chauncey Cruel, MD.    I, Lurline Del MD, have reviewed the above documentation for accuracy and completeness, and I agree with the above.

## 2019-05-10 ENCOUNTER — Inpatient Hospital Stay (HOSPITAL_BASED_OUTPATIENT_CLINIC_OR_DEPARTMENT_OTHER): Payer: PPO | Admitting: Oncology

## 2019-05-10 ENCOUNTER — Inpatient Hospital Stay: Payer: PPO | Attending: Oncology

## 2019-05-10 ENCOUNTER — Other Ambulatory Visit: Payer: Self-pay

## 2019-05-10 VITALS — BP 159/103 | HR 84 | Temp 97.8°F | Resp 18 | Ht 62.0 in | Wt 232.4 lb

## 2019-05-10 DIAGNOSIS — Z79811 Long term (current) use of aromatase inhibitors: Secondary | ICD-10-CM | POA: Insufficient documentation

## 2019-05-10 DIAGNOSIS — Z803 Family history of malignant neoplasm of breast: Secondary | ICD-10-CM | POA: Insufficient documentation

## 2019-05-10 DIAGNOSIS — Z1589 Genetic susceptibility to other disease: Secondary | ICD-10-CM | POA: Diagnosis not present

## 2019-05-10 DIAGNOSIS — Z923 Personal history of irradiation: Secondary | ICD-10-CM | POA: Insufficient documentation

## 2019-05-10 DIAGNOSIS — Z171 Estrogen receptor negative status [ER-]: Secondary | ICD-10-CM

## 2019-05-10 DIAGNOSIS — C50412 Malignant neoplasm of upper-outer quadrant of left female breast: Secondary | ICD-10-CM

## 2019-05-10 DIAGNOSIS — Z79899 Other long term (current) drug therapy: Secondary | ICD-10-CM | POA: Diagnosis not present

## 2019-05-10 DIAGNOSIS — C50919 Malignant neoplasm of unspecified site of unspecified female breast: Secondary | ICD-10-CM | POA: Diagnosis not present

## 2019-05-10 DIAGNOSIS — Z90710 Acquired absence of both cervix and uterus: Secondary | ICD-10-CM | POA: Diagnosis not present

## 2019-05-10 DIAGNOSIS — Z85038 Personal history of other malignant neoplasm of large intestine: Secondary | ICD-10-CM | POA: Diagnosis not present

## 2019-05-10 DIAGNOSIS — R918 Other nonspecific abnormal finding of lung field: Secondary | ICD-10-CM | POA: Insufficient documentation

## 2019-05-10 DIAGNOSIS — C189 Malignant neoplasm of colon, unspecified: Secondary | ICD-10-CM | POA: Diagnosis not present

## 2019-05-10 DIAGNOSIS — Z1509 Genetic susceptibility to other malignant neoplasm: Secondary | ICD-10-CM

## 2019-05-10 DIAGNOSIS — Z9049 Acquired absence of other specified parts of digestive tract: Secondary | ICD-10-CM | POA: Diagnosis not present

## 2019-05-10 DIAGNOSIS — Z1502 Genetic susceptibility to malignant neoplasm of ovary: Secondary | ICD-10-CM | POA: Diagnosis not present

## 2019-05-10 LAB — COMPREHENSIVE METABOLIC PANEL
ALT: 12 U/L (ref 0–44)
AST: 12 U/L — ABNORMAL LOW (ref 15–41)
Albumin: 3.8 g/dL (ref 3.5–5.0)
Alkaline Phosphatase: 117 U/L (ref 38–126)
Anion gap: 10 (ref 5–15)
BUN: 7 mg/dL — ABNORMAL LOW (ref 8–23)
CO2: 28 mmol/L (ref 22–32)
Calcium: 9.8 mg/dL (ref 8.9–10.3)
Chloride: 105 mmol/L (ref 98–111)
Creatinine, Ser: 0.83 mg/dL (ref 0.44–1.00)
GFR calc Af Amer: >60 mL/min (ref >60)
GFR calc non Af Amer: >60 mL/min (ref >60)
Glucose, Bld: 99 mg/dL (ref 70–99)
Potassium: 3.7 mmol/L (ref 3.5–5.1)
Sodium: 143 mmol/L (ref 135–145)
Total Bilirubin: 1.4 mg/dL — ABNORMAL HIGH (ref 0.3–1.2)
Total Protein: 7.6 g/dL (ref 6.5–8.1)

## 2019-05-10 LAB — CBC WITH DIFFERENTIAL/PLATELET
Abs Immature Granulocytes: 0 10*3/uL (ref 0.00–0.07)
Basophils Absolute: 0 10*3/uL (ref 0.0–0.1)
Basophils Relative: 1 %
Eosinophils Absolute: 0.1 10*3/uL (ref 0.0–0.5)
Eosinophils Relative: 3 %
HCT: 37.6 % (ref 36.0–46.0)
Hemoglobin: 12.2 g/dL (ref 12.0–15.0)
Immature Granulocytes: 0 %
Lymphocytes Relative: 33 %
Lymphs Abs: 1.7 10*3/uL (ref 0.7–4.0)
MCH: 29.5 pg (ref 26.0–34.0)
MCHC: 32.4 g/dL (ref 30.0–36.0)
MCV: 91 fL (ref 80.0–100.0)
Monocytes Absolute: 0.4 10*3/uL (ref 0.1–1.0)
Monocytes Relative: 8 %
Neutro Abs: 3 10*3/uL (ref 1.7–7.7)
Neutrophils Relative %: 55 %
Platelets: 280 10*3/uL (ref 150–400)
RBC: 4.13 MIL/uL (ref 3.87–5.11)
RDW: 12.7 % (ref 11.5–15.5)
WBC: 5.3 10*3/uL (ref 4.0–10.5)
nRBC: 0 % (ref 0.0–0.2)

## 2019-05-10 MED ORDER — LISINOPRIL-HYDROCHLOROTHIAZIDE 20-12.5 MG PO TABS: 1.0000 | ORAL_TABLET | Freq: Every day | ORAL | 6 refills | Status: DC

## 2019-05-10 MED ORDER — ANASTROZOLE 1 MG PO TABS: 1.0000 mg | ORAL_TABLET | Freq: Every day | ORAL | 4 refills | Status: DC

## 2019-05-10 MED FILL — ANASTROZOLE 1 MG TABLET: 1 | 90 days supply | Qty: 90 | Fill #0

## 2019-05-10 MED FILL — LISINOPRIL-HCTZ 20-12.5 MG: 20-12.5 | 30 days supply | Qty: 30 | Fill #0

## 2019-05-11 ENCOUNTER — Telehealth: Payer: Self-pay | Admitting: Oncology

## 2019-05-11 NOTE — Telephone Encounter (Signed)
I talk with patient regarding schedule  

## 2019-05-12 ENCOUNTER — Ambulatory Visit (INDEPENDENT_AMBULATORY_CARE_PROVIDER_SITE_OTHER): Payer: PPO | Admitting: Nurse Practitioner

## 2019-05-12 ENCOUNTER — Encounter: Payer: Self-pay | Admitting: Nurse Practitioner

## 2019-05-12 ENCOUNTER — Other Ambulatory Visit: Payer: Self-pay

## 2019-05-12 VITALS — BP 112/86 | HR 93 | Temp 98.3°F | Ht 61.4 in | Wt 231.6 lb

## 2019-05-12 DIAGNOSIS — Z23 Encounter for immunization: Secondary | ICD-10-CM | POA: Diagnosis not present

## 2019-05-12 DIAGNOSIS — R0609 Other forms of dyspnea: Secondary | ICD-10-CM

## 2019-05-12 DIAGNOSIS — I10 Essential (primary) hypertension: Secondary | ICD-10-CM

## 2019-05-12 DIAGNOSIS — R0600 Dyspnea, unspecified: Secondary | ICD-10-CM

## 2019-05-12 MED ORDER — LISINOPRIL-HYDROCHLOROTHIAZIDE 20-12.5 MG PO TABS: ORAL_TABLET | ORAL | 6 refills | Status: DC

## 2019-05-12 NOTE — Patient Instructions (Signed)
Influenza Virus Vaccine (Flucelvax) What is this medicine? INFLUENZA VIRUS VACCINE (in floo EN zuh VAHY ruhs vak SEEN) helps to reduce the risk of getting influenza also known as the flu. The vaccine only helps protect you against some strains of the flu. This medicine may be used for other purposes; ask your health care provider or pharmacist if you have questions. COMMON BRAND NAME(S): FLUCELVAX What should I tell my health care provider before I take this medicine? They need to know if you have any of these conditions:  bleeding disorder like hemophilia  fever or infection  Guillain-Barre syndrome or other neurological problems  immune system problems  infection with the human immunodeficiency virus (HIV) or AIDS  low blood platelet counts  multiple sclerosis  an unusual or allergic reaction to influenza virus vaccine, other medicines, foods, dyes or preservatives  pregnant or trying to get pregnant  breast-feeding How should I use this medicine? This vaccine is for injection into a muscle. It is given by a health care professional. A copy of Vaccine Information Statements will be given before each vaccination. Read this sheet carefully each time. The sheet may change frequently. Talk to your pediatrician regarding the use of this medicine in children. Special care may be needed. Overdosage: If you think you've taken too much of this medicine contact a poison control center or emergency room at once. Overdosage: If you think you have taken too much of this medicine contact a poison control center or emergency room at once. NOTE: This medicine is only for you. Do not share this medicine with others. What if I miss a dose? This does not apply. What may interact with this medicine?  chemotherapy or radiation therapy  medicines that lower your immune system like etanercept, anakinra, infliximab, and adalimumab  medicines that treat or prevent blood clots like  warfarin  phenytoin  steroid medicines like prednisone or cortisone  theophylline  vaccines This list may not describe all possible interactions. Give your health care provider a list of all the medicines, herbs, non-prescription drugs, or dietary supplements you use. Also tell them if you smoke, drink alcohol, or use illegal drugs. Some items may interact with your medicine. What should I watch for while using this medicine? Report any side effects that do not go away within 3 days to your doctor or health care professional. Call your health care provider if any unusual symptoms occur within 6 weeks of receiving this vaccine. You may still catch the flu, but the illness is not usually as bad. You cannot get the flu from the vaccine. The vaccine will not protect against colds or other illnesses that may cause fever. The vaccine is needed every year. What side effects may I notice from receiving this medicine? Side effects that you should report to your doctor or health care professional as soon as possible:  allergic reactions like skin rash, itching or hives, swelling of the face, lips, or tongue Side effects that usually do not require medical attention (Report these to your doctor or health care professional if they continue or are bothersome.):  fever  headache  muscle aches and pains  pain, tenderness, redness, or swelling at the injection site  tiredness This list may not describe all possible side effects. Call your doctor for medical advice about side effects. You may report side effects to FDA at 1-800-FDA-1088. Where should I keep my medicine? The vaccine will be given by a health care professional in a clinic, pharmacy, doctor's   office, or other health care setting. You will not be given vaccine doses to store at home. NOTE: This sheet is a summary. It may not cover all possible information. If you have questions about this medicine, talk to your doctor, pharmacist, or  health care provider.  2020 Elsevier/Gold Standard (2011-08-13 14:06:47)  

## 2019-05-12 NOTE — Progress Notes (Signed)
Subjective:     [name] , female    DOB: 05/10/1951 , 68 y.o.   MRN: [id number]   Chief Complaint  Patient presents with  . Shortness of Breath    patient presents today for a 6 month f/u   . Hypertension    patient's blood pressure was high on tuesday and Dr.Magrinaut prescribed patient blood pressure medicine and told pt to let us know when she came in today for her appointment    HPI  Wt Readings from Last 3 Encounters: 05/12/19 : 231 lb 9.6 oz (105.1 kg) 05/10/19 : 232 lb 6 oz (105.4 kg) 11/11/18 : 244 lb (110.7 kg)  She has been walking a little more and monitoring her food  Shortness of Breath This is a chronic problem. The current episode started more than 1 month ago. Pertinent negatives include no abdominal pain or headaches.  Hypertension Pertinent negatives include no headaches or shortness of breath (none currently, she feels she has improved).     Past Medical History:  Diagnosis Date  . Colon cancer ([place])   . Hx of rotator cuff surgery   . left breast cancer   . Personal history of chemotherapy 2016   left  . Personal history of radiation therapy 2016   left breast   . Radiation 11/15/14-01/02/15   left breast, axillary and supraclavicular region 45 gray, lumpectomy cavity boosted to 16 gray     Family History  Problem Relation Age of Onset  . Cancer Mother 65       breast  . Breast cancer Mother        early 27s  . Stroke Father 58       cause of death  . Diabetes Father   . Cancer Maternal Aunt        breast cancer at unknown age  . Breast cancer Maternal Aunt   . Ovarian cancer Neg Hx      Current Outpatient Medications:  .  acetaminophen (TYLENOL) 500 MG tablet, Take 1,000 mg by mouth every 6 (six) hours as needed for mild pain or moderate pain., Disp: , Rfl:  .  anastrozole (ARIMIDEX) 1 MG tablet, Take 1 tablet (1 mg total) by mouth daily., Disp: 90 tablet, Rfl: 4 .  gabapentin (NEURONTIN) 300 MG capsule, TAKE 1  CAPSULE BY MOUTH EVERYDAY AT BEDTIME, Disp: 90 capsule, Rfl: 0 .  lisinopril-hydrochlorothiazide (ZESTORETIC) 20-12.5 MG tablet, Take 1 tablet by mouth daily., Disp: 30 tablet, Rfl: 6 .  potassium chloride (K-DUR) 10 MEQ tablet, Take 1 tablet (10 mEq total) by mouth 2 (two) times daily., Disp: 60 tablet, Rfl: 4 .  ursodiol (ACTIGALL) 500 MG tablet, Take 500 mg by mouth 3 (three) times daily., Disp: , Rfl:    No Known Allergies   Review of Systems  Constitutional: Negative.   Respiratory: Negative for cough and shortness of breath (none currently, she feels she has improved).   Gastrointestinal: Negative for abdominal pain.  Musculoskeletal: Negative.   Skin: Negative.   Neurological: Negative for dizziness and headaches.  Psychiatric/Behavioral: Negative.      Today's Vitals   05/12/19 1021  BP: 112/86  Pulse: 93  Temp: 98.3 F (36.8 C)  TempSrc: Oral  Weight: 231 lb 9.6 oz (105.1 kg)  Height: 5' 1.4" (1.56 m)  PainSc: 0-No pain   Body mass index is 43.19 kg/m.   Objective:  Physical Exam Constitutional:      General: She is not in acute  distress.    Appearance: She is well-developed. She is obese.  Cardiovascular:     Rate and Rhythm: Normal rate and regular rhythm.  Pulmonary:     Effort: Pulmonary effort is normal.     Breath sounds: Normal breath sounds. No decreased breath sounds, wheezing or rhonchi.  Chest:     Chest wall: No mass.  Skin:    General: Skin is warm and dry.     Capillary Refill: Capillary refill takes less than 2 seconds.  Neurological:     General: No focal deficit present.     Mental Status: She is alert and oriented to person, place, and time.  Psychiatric:        Mood and Affect: Mood normal. Mood is not anxious.        Behavior: Behavior normal.         Assessment And Plan:     1. Dyspnea on exertion  She no longer has shortness of breath  She was congratulated on her 13 lb weight loss which may have helped her with the  shortness of breath - lisinopril-hydrochlorothiazide (ZESTORETIC) 20-12.5 MG tablet; Take 1/2 tablet daily by mouth  Dispense: 30 tablet; Refill: 6  2. Essential hypertension  Chronic, excellent control  Will decrease her dose of lisinopril/HCTC since she is doing well.   She is to check her blood pressure daily if greater than 150/90 return call to office - lisinopril-hydrochlorothiazide (ZESTORETIC) 20-12.5 MG tablet; Take 1/2 tablet daily by mouth  Dispense: 30 tablet; Refill: 6  3. Encounter for immunization  Influenza vaccine given in office  Advised to take Tylenol as needed for muscle aches or fever - Flu vaccine HIGH DOSE PF (Fluzone High dose)    Minette Brine, FNP    THE PATIENT IS ENCOURAGED TO PRACTICE SOCIAL DISTANCING DUE TO THE COVID-19 PANDEMIC.

## 2019-06-28 MED FILL — LISINOPRIL-HCTZ 20-12.5 MG: 20-12.5 | 30 days supply | Qty: 30 | Fill #1

## 2019-08-18 ENCOUNTER — Ambulatory Visit: Payer: PPO | Admitting: Nurse Practitioner

## 2019-08-23 MED FILL — ANASTROZOLE 1 MG TABLET: 1 | 90 days supply | Qty: 90 | Fill #1

## 2019-08-23 MED FILL — LISINOPRIL-HCTZ 20-12.5 MG: 20-12.5 | 30 days supply | Qty: 30 | Fill #2

## 2019-09-14 ENCOUNTER — Telehealth: Payer: Self-pay | Admitting: *Deleted

## 2019-09-14 ENCOUNTER — Other Ambulatory Visit: Payer: Self-pay | Admitting: *Deleted

## 2019-09-14 NOTE — Telephone Encounter (Signed)
This RN returned VM to pt per her inquiry if she was to have an MRI before the end of the year.  Per last visit - MD notes pt is due for mammogram in February and MRI in August of 2021.  Obtained identified VM for patient- message left per above.

## 2019-09-16 DIAGNOSIS — C50919 Malignant neoplasm of unspecified site of unspecified female breast: Secondary | ICD-10-CM

## 2019-09-16 HISTORY — DX: Malignant neoplasm of unspecified site of unspecified female breast: C50.919

## 2019-10-30 ENCOUNTER — Other Ambulatory Visit: Payer: Self-pay

## 2019-10-30 ENCOUNTER — Ambulatory Visit: Payer: PPO | Attending: Internal Medicine

## 2019-10-30 DIAGNOSIS — Z23 Encounter for immunization: Secondary | ICD-10-CM | POA: Insufficient documentation

## 2019-10-30 NOTE — Progress Notes (Signed)
   Covid-19 Vaccination Clinic  Name:  Axel Hackett    MRN: [id number] DOB: 1951/06/17  10/30/2019  Ms. Bry was observed post Covid-19 immunization for 15 minutes without incidence. She was provided with Vaccine Information Sheet and instruction to access the V-Safe system.   Ms. Roome was instructed to call 911 with any severe reactions post vaccine: Marland Kitchen Difficulty breathing  . Swelling of your face and throat  . A fast heartbeat  . A bad rash all over your body  . Dizziness and weakness    Immunizations Administered    Name Date Dose VIS Date Route   Moderna COVID-19 Vaccine 10/30/2019  2:12 PM 0.5 mL 08/16/2019 Intramuscular   Manufacturer: Moderna   Lot: [id number]   [id number]

## 2019-11-15 ENCOUNTER — Ambulatory Visit
Admission: RE | Admit: 2019-11-15 | Discharge: 2019-11-15 | Disposition: A | Discharge status: Home / Self Care | Payer: PPO | Source: Ambulatory Visit | Attending: Oncology | Admitting: Oncology

## 2019-11-15 ENCOUNTER — Other Ambulatory Visit: Payer: Self-pay

## 2019-11-15 DIAGNOSIS — Z171 Estrogen receptor negative status [ER-]: Secondary | ICD-10-CM

## 2019-11-15 DIAGNOSIS — Z853 Personal history of malignant neoplasm of breast: Secondary | ICD-10-CM | POA: Diagnosis not present

## 2019-11-15 DIAGNOSIS — C50412 Malignant neoplasm of upper-outer quadrant of left female breast: Secondary | ICD-10-CM

## 2019-11-15 DIAGNOSIS — C50919 Malignant neoplasm of unspecified site of unspecified female breast: Secondary | ICD-10-CM

## 2019-11-15 DIAGNOSIS — C189 Malignant neoplasm of colon, unspecified: Secondary | ICD-10-CM

## 2019-11-15 DIAGNOSIS — Z1589 Genetic susceptibility to other disease: Secondary | ICD-10-CM

## 2019-11-15 DIAGNOSIS — R928 Other abnormal and inconclusive findings on diagnostic imaging of breast: Secondary | ICD-10-CM | POA: Diagnosis not present

## 2019-11-17 ENCOUNTER — Encounter: Payer: PPO | Admitting: Nurse Practitioner

## 2019-11-17 ENCOUNTER — Ambulatory Visit: Payer: PPO

## 2019-11-27 ENCOUNTER — Ambulatory Visit: Payer: PPO | Attending: Internal Medicine

## 2019-11-27 DIAGNOSIS — Z23 Encounter for immunization: Secondary | ICD-10-CM

## 2019-11-27 NOTE — Progress Notes (Signed)
   Covid-19 Vaccination Clinic  Name:  [name]    MRN: [id number] DOB: [birth year]  11/27/2019  [name] was observed post Covid-19 immunization for 15 minutes without incident. She was provided with Vaccine Information Sheet and instruction to access the V-Safe system.   [name] was instructed to call 911 with any severe reactions post vaccine: Marland Kitchen Difficulty breathing  . Swelling of face and throat  . A fast heartbeat  . A bad rash all over body  . Dizziness and weakness   Immunizations Administered    Name Date Dose VIS Date Route   Moderna COVID-19 Vaccine 11/27/2019  1:08 PM 0.5 mL 08/16/2019 Intramuscular   Manufacturer: Moderna   Lot: [id number]   [place][id number]

## 2019-11-28 ENCOUNTER — Other Ambulatory Visit: Payer: PPO

## 2019-11-28 ENCOUNTER — Ambulatory Visit: Payer: PPO | Admitting: Oncology

## 2019-12-06 NOTE — Progress Notes (Signed)
[facility]  [phone] [phone]     ID: [name] DOB: 1951-05-20  MR#: [id number]  KNL#:[id number]  Patient Care Team: Minette Brine, FNP as PCP - General (General Practice) Hernandez Losasso, Virgie Dad, MD as Consulting Physician (Oncology) Rolm Bookbinder, MD as Consulting Physician (General Surgery) Gery Pray, MD as Consulting Physician (Radiation Oncology) [name], MD as Consulting Physician (Ophthalmology)   CHIEF COMPLAINT: triple negative breast cancer; PALB2 positive  CURRENT TREATMENT: Intensified screening, anastrozole    INTERVAL HISTORY: [name] returns today for follow-up and treatment of her 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   She continues on anastrozole.  She is not having any hot flashes or vaginal dryness problems from this medication.  She obtains it under good price.  [name]'s last bone density screening on 12/31/2017, showed a T-score of -0.6, which is considered normal.    Since her last visit, she underwent bilateral diagnostic mammography with tomography at [facility] on 11/15/2019 showing: breast density category B; no evidence of malignancy in either breast.   She has received both doses of the Covid-19 vaccine, on 10/30/2019 and 11/27/2019.   REVIEW OF SYSTEMS: [name] feels "great".  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  She does some walking for exercise.  She did well with her vaccine doses with no unusual side effects.  A detailed review of systems today was otherwise entirely benign.   BREAST CANCER HISTORY: From the original intake note:  [name] herself palpated a mass in her left breast, and immediately brought it to the attention of her primary care physician, Dr. Baird Cancer, who confirmed the finding and setup the patient for bilateral diagnostic mammography at the breast Center 02/20/2014. This showed a high density mass in the outer left breast measuring up to  3.7 cm. It corresponded to the site of palpable concern. In addition the normal 4 logically abnormal lymph nodes in the left axilla. The mass was palpable by exam, and by ultrasonography measured 2.6 cm. There were enlarged and morphologically abnormal lymph nodes in the left axilla, largest measuring 3 cm.  Biopsy of the left breast mass in question and 1 of the abnormal axillary lymph nodes 02/22/2014 showed ([id number]) biopsies to be positive for an invasive ductal carcinoma, grade 3, triple negative, with an MIB-1 of 90%. Specifically the HER-2 signals ratio was 1.05, and the number per cell 2.00.  MRI of the breasts 03/01/2014 showed a 3.8 cm enhancing mass with areas of apparent necrosis in the left breast, as well as the biopsy tract extending laterally, the combined measure 5.3 cm. There were no other masses or areas of abnormal enhancement. There were multiple abnormally enlarged left axillary lymph nodes with loss of the normal fatty hilum. These included level I and retropectoral lymph nodes.  The patient's subsequent history is as detailed below   PAST MEDICAL HISTORY: Past Medical History:  Diagnosis Date   Colon cancer ([place])    Hx of rotator cuff surgery    left breast cancer    Personal history of chemotherapy 2016   left   Personal history of radiation therapy 2016   left breast    Radiation 11/15/14-01/02/15   left breast, axillary and supraclavicular region 45 gray, lumpectomy cavity boosted to 16 gray    PAST SURGICAL HISTORY: Past Surgical History:  Procedure Laterality Date   ABDOMINAL HYSTERECTOMY  1988   BREAST EXCISIONAL BIOPSY Right 12/21/2017   BREAST LUMPECTOMY Left 2016  COLON SURGERY  1988   colectomy/colostomy-ca   COLON SURGERY  1988   colostomy takedown-reversal   COLONOSCOPY     EXCISION / BIOPSY BREAST / NIPPLE / DUCT Left 09/20/2014   RADIOACTIVE SEED GUIDED EXCISIONAL BREAST BIOPSY Right 12/21/2017   Procedure: RIGHT RADIOACTIVE SEED  GUIDED EXCISIONAL BREAST BIOPSY ERAS PATHWAY;  Surgeon: Rolm Bookbinder, MD;  Location: [facility];  Service: General;  Laterality: Right;   RADIOACTIVE SEED GUIDED PARTIAL MASTECTOMY WITH AXILLARY SENTINEL LYMPH NODE BIOPSY Left 09/20/2014   Procedure: RADIOACTIVE SEED GUIDED LEFT BREAST LUMPECTOMY WITH AXILLARY SENTINEL LYMPH NODE BIOPSY;  Surgeon: Rolm Bookbinder, MD;  Location: [facility];  Service: General;  Laterality: Left;   TOTAL SHOULDER ARTHROPLASTY  2009   right    FAMILY HISTORY Family History  Problem Relation Age of Onset   Cancer Mother 34       breast   Breast cancer Mother        early 21s   Stroke Father 38       cause of death   Diabetes Father    Cancer Maternal Aunt        breast cancer at unknown age   Breast cancer Maternal Aunt    Ovarian cancer Neg Hx    the patient's father died at the age of 15 following a stroke in the setting of diabetes; the patient's mother is living at 1. The patient has 3 brothers and 2 sisters. [name], [name], was diagnosed with breast cancer in her early 44s. There is no other breast or ovarian cancer in the family. The patient herself was diagnosed with watts by history he is an incidentally found stage I colon carcinoma noted at the time of her hysterectomy. She had a partial colectomy but no adjuvant treatment. We have no records regarding that procedure   GYNECOLOGIC HISTORY:  No LMP recorded. Patient has had a hysterectomy. Menarche age 11, first live birth age 49, the patient underwent total abdominal hysterectomy with bilateral salpingo-oophorectomy in her 54s. She did not take hormone replacement. She did not take oral contraceptives at any point.   SOCIAL HISTORY:  [name] worked for [employer] for about 40 years, retiring in 2008. She is divorced and lives alone, with no pets. Her daughter [name] works for [employer] in [employer]. The second daughter,  [name], works as a Scientist, water quality at [employer]  The patient has 6 grandchildren and three great-grandchildren. She attends a [employer]. (updated 09/28/2018)    ADVANCED DIRECTIVES: Not in place. The patient intends to name her daughter [name]. her healthcare power of attorney. [name] is work number is [phone]. On the patient's 03/01/2014 visit she was given a copy of the appropriate documents to complete and notarize at her discretion.     HEALTH MAINTENANCE: Social History   Tobacco Use   Smoking status: Never Smoker   Smokeless tobacco: Never Used  Substance Use Topics   Alcohol use: Not Currently    Comment: occ-rare   Drug use: No     Colonoscopy: 2000  PAP: May 2015  Bone density: 12/21/2017, T-score of -0.6  Lipid panel: 02/03/2014  No Known Allergies  Current Outpatient Medications  Medication Sig Dispense Refill   acetaminophen (TYLENOL) 500 MG tablet Take 1,000 mg by mouth every 6 (six) hours as needed for mild pain or moderate pain.     anastrozole (ARIMIDEX) 1 MG tablet Take 1 tablet (1 mg total) by  mouth daily. 90 tablet 4   gabapentin (NEURONTIN) 300 MG capsule TAKE 1 CAPSULE BY MOUTH EVERYDAY AT BEDTIME 90 capsule 0   lisinopril-hydrochlorothiazide (ZESTORETIC) 20-12.5 MG tablet Take 1/2 tablet daily by mouth 30 tablet 6   potassium chloride (K-DUR) 10 MEQ tablet Take 1 tablet (10 mEq total) by mouth 2 (two) times daily. (Patient not taking: Reported on 12/07/2019) 60 tablet 4   ursodiol (ACTIGALL) 500 MG tablet Take 500 mg by mouth 3 (three) times daily.     No current facility-administered medications for this visit.    OBJECTIVE: African-American woman w in no acute distress  Vitals:   12/07/19 1245  BP: (!) 142/90  Pulse: 91  Resp: 18  Temp: 98.5 F (36.9 C)  SpO2: 100%    Body mass index is 43.19 kg/m.     ECOG FS:1 - Symptomatic but completely ambulatory  Sclerae unicteric, EOMs intact Wearing a mask No cervical or  supraclavicular adenopathy Lungs no rales or rhonchi Heart regular rate and rhythm Abd soft, obese, nontender, positive bowel sounds MSK no focal spinal tenderness, no upper extremity lymphedema, broad-based gait as previously noted Neuro: nonfocal, well oriented, appropriate affect Breasts: The right breast is status post lumpectomy with no evidence of local recurrence.  The left breast is benign.  Both axillae are benign.   LAB RESULTS:  No results found for: SPEP  Lab Results  Component Value Date   WBC 5.3 05/10/2019   NEUTROABS 3.0 05/10/2019   HGB 12.2 05/10/2019   HCT 37.6 05/10/2019   MCV 91.0 05/10/2019   PLT 280 05/10/2019      Chemistry      Component Value Date/Time   NA 143 05/10/2019 0908   NA 142 11/13/2016 1050   K 3.7 05/10/2019 0908   K 4.1 11/13/2016 1050   CL 105 05/10/2019 0908   CO2 28 05/10/2019 0908   CO2 27 11/13/2016 1050   BUN 7 (L) 05/10/2019 0908   BUN 11.5 11/13/2016 1050   CREATININE 0.83 05/10/2019 0908   CREATININE 1.0 11/13/2016 1050      Component Value Date/Time   CALCIUM 9.8 05/10/2019 0908   CALCIUM 9.8 11/13/2016 1050   ALKPHOS 117 05/10/2019 0908   ALKPHOS 140 11/13/2016 1050   AST 12 (L) 05/10/2019 0908   AST 13 11/13/2016 1050   ALT 12 05/10/2019 0908   ALT 15 11/13/2016 1050   BILITOT 1.4 (H) 05/10/2019 0908   BILITOT 0.86 11/13/2016 1050       No results found for: LABCA2  No components found for: LABCA125  No results for input(s): INR in the last 168 hours.  Urinalysis    Component Value Date/Time   COLORURINE YELLOW 04/05/2008 1120   APPEARANCEUR CLEAR 04/05/2008 1120   LABSPEC 1.021 04/05/2008 1120   PHURINE 6.0 04/05/2008 1120   GLUCOSEU NEGATIVE 04/05/2008 1120   HGBUR NEGATIVE 04/05/2008 1120   BILIRUBINUR negative 12/07/2019 1212   KETONESUR NEGATIVE 04/05/2008 1120   PROTEINUR Negative 12/07/2019 1212   PROTEINUR NEGATIVE 04/05/2008 1120   UROBILINOGEN 1.0 12/07/2019 1212   UROBILINOGEN 0.2  04/05/2008 1120   NITRITE negative 12/07/2019 1212   NITRITE NEGATIVE 04/05/2008 1120   LEUKOCYTESUR Negative 12/07/2019 1212    STUDIES: MM DIAG BREAST TOMO BILATERAL  Result Date: 11/15/2019 CLINICAL DATA:  69 year old female with history of left breast cancer post lumpectomy 2016. MRI guided biopsy of a linear area of enhancement in the superior left breast 04/12/2019 revealed sclerotic fibroadenomatoid nodule with calcifications  which was considered concordant. Patient also a history of right breast excision 12/21/2017 for an intraductal papilloma. EXAM: DIGITAL DIAGNOSTIC BILATERAL MAMMOGRAM WITH CAD AND TOMO COMPARISON:  Previous exam(s). ACR Breast Density Category b: There are scattered areas of fibroglandular density. FINDINGS: No suspicious masses or calcifications are seen in either breast. Postsurgical changes are present within the outer right breast related to prior benign excision. Lumpectomy changes are again seen with the central upper left breast. Spot compression magnification CC view of the left breast lumpectomy site was performed. Benign coarse dystrophic type calcifications are identified at the lumpectomy site. There are no findings of locally recurrent malignancy. Mammographic images were processed with CAD. IMPRESSION: No mammographic evidence of malignancy in either breast. RECOMMENDATION: Screening mammogram in one year.(Code:SM-[id number]) I have discussed the findings and recommendations with the patient. If applicable, a reminder letter will be sent to the patient regarding the next appointment. BI-RADS CATEGORY  2: Benign. Electronically Signed   By: Everlean Alstrom M.D.   On: 11/15/2019 10:50      ASSESSMENT: 69 y.o. BRCA negative [place] woman s/p Left breast upper outer quadrant and left axillary lymph node biopsy 02/22/2014, both positive for a clinical T2 N2, stage IIIA invasive ductal carcinoma, grade 3, triple negative, with an MIB-1 of 90%  (1) neoadjuvant  chemotherapy started 03/13/2014, with cyclophosphamide and doxorubicin in dose dense fashion x4, with Neulasta support on day 2, completed 04/24/2014, followed by weekly carboplatin and paclitaxel x12, paclitaxel reduced during cycle 5 and cycle 7 because of elevated bilirubin levels  (2) left lumpectomy and sentinel lymph node sampling 09/20/2014 showed a complete pathologic response.  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  (b) PALB2 mutations are known to increase the risk of breast and pancreatic cancer, possibly other cancers, but the data is preliminary  (c) the patient's 2 daughters have been tested and did not carry the gene  (d) Zarra's MSH2 VUS has been reclassified as "likely benign"  (e) she is status post remote TAH/BSO  (f) intensified screening, with mammography in February and breast MRI in August planned  (6) staging studies showed right-sided lung nodules, too small to characterize, and a dominant right-sided thyroid nodule unchanged in size as compared to 2011; to be followed  (a) chest CT 04/24/2016 showed the small pulmonary nodules to be completely stable, consistent with benign findings.  (b) consider  thyroid ultrasound--done on 07/22/2018 and showed enlarged heterogeneous lobular and multinodular thyroid gland, TI-RADS category 3 nodules, not meeting criteria for further biopsy or dedicated imaging f/u.    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  (8) anastrozole started 07/08/2018 for breast cancer prevention  (a) bone density scan 01/01/2018  normal, T score -0.6   PLAN: Donnie is now a year and a half out from the start of her anastrozole treatment with good tolerance.  She will take this for 5 years chiefly for prevention.  She will have her breast MRI in September and see my nurse practitioner in October.  She will then have her next mammography in March and see me in April 2022.  With that mammogram she also will  have a bone density.  All those orders have been entered  She continues to benefit from gabapentin and that order was renewed.  Total encounter time 20 minutes.*    Mickey Esguerra, Virgie Dad, MD  12/07/19 12:53 PM Medical Oncology and Hematology [facility] [address], [place] [address] Tel. [phone]    Fax. [phone]   I, Wilburn Mylar, am acting as scribe for Dr. Virgie Dad. Elisha Mcgruder.  I, Lurline Del MD, have reviewed the above documentation for accuracy and completeness, and I agree with the above.    *Total Encounter Time as defined by the Centers for Medicare and Medicaid Services includes, in addition to the face-to-face time of a patient visit (documented in the note above) non-face-to-face time: obtaining and reviewing outside history, ordering and reviewing medications, tests or procedures, care coordination (communications with other health care professionals or caregivers) and documentation in the medical record.

## 2019-12-07 ENCOUNTER — Other Ambulatory Visit: Payer: Self-pay

## 2019-12-07 ENCOUNTER — Inpatient Hospital Stay: Payer: PPO | Attending: Oncology

## 2019-12-07 ENCOUNTER — Encounter: Payer: Self-pay | Admitting: Nurse Practitioner

## 2019-12-07 ENCOUNTER — Inpatient Hospital Stay (HOSPITAL_BASED_OUTPATIENT_CLINIC_OR_DEPARTMENT_OTHER): Payer: PPO | Admitting: Oncology

## 2019-12-07 ENCOUNTER — Ambulatory Visit (INDEPENDENT_AMBULATORY_CARE_PROVIDER_SITE_OTHER): Payer: PPO

## 2019-12-07 ENCOUNTER — Ambulatory Visit (INDEPENDENT_AMBULATORY_CARE_PROVIDER_SITE_OTHER): Payer: PPO | Admitting: Nurse Practitioner

## 2019-12-07 VITALS — BP 124/82 | HR 97 | Temp 98.5°F | Ht 61.6 in | Wt 233.7 lb

## 2019-12-07 VITALS — BP 124/82 | HR 97 | Temp 98.5°F | Ht 61.6 in | Wt 233.6 lb

## 2019-12-07 VITALS — BP 142/90 | HR 91 | Temp 98.5°F | Resp 18 | Ht 61.6 in | Wt 233.1 lb

## 2019-12-07 DIAGNOSIS — Z0000 Encounter for general adult medical examination without abnormal findings: Secondary | ICD-10-CM | POA: Diagnosis not present

## 2019-12-07 DIAGNOSIS — E041 Nontoxic single thyroid nodule: Secondary | ICD-10-CM | POA: Insufficient documentation

## 2019-12-07 DIAGNOSIS — Z79899 Other long term (current) drug therapy: Secondary | ICD-10-CM | POA: Insufficient documentation

## 2019-12-07 DIAGNOSIS — Z171 Estrogen receptor negative status [ER-]: Secondary | ICD-10-CM

## 2019-12-07 DIAGNOSIS — Z79811 Long term (current) use of aromatase inhibitors: Secondary | ICD-10-CM | POA: Diagnosis not present

## 2019-12-07 DIAGNOSIS — Z90722 Acquired absence of ovaries, bilateral: Secondary | ICD-10-CM | POA: Insufficient documentation

## 2019-12-07 DIAGNOSIS — C189 Malignant neoplasm of colon, unspecified: Secondary | ICD-10-CM

## 2019-12-07 DIAGNOSIS — I10 Essential (primary) hypertension: Secondary | ICD-10-CM

## 2019-12-07 DIAGNOSIS — C50412 Malignant neoplasm of upper-outer quadrant of left female breast: Secondary | ICD-10-CM

## 2019-12-07 DIAGNOSIS — Z9221 Personal history of antineoplastic chemotherapy: Secondary | ICD-10-CM | POA: Insufficient documentation

## 2019-12-07 DIAGNOSIS — Z6841 Body Mass Index (BMI) 40.0 and over, adult: Secondary | ICD-10-CM

## 2019-12-07 DIAGNOSIS — Z923 Personal history of irradiation: Secondary | ICD-10-CM | POA: Insufficient documentation

## 2019-12-07 DIAGNOSIS — Z85038 Personal history of other malignant neoplasm of large intestine: Secondary | ICD-10-CM | POA: Insufficient documentation

## 2019-12-07 DIAGNOSIS — C770 Secondary and unspecified malignant neoplasm of lymph nodes of head, face and neck: Secondary | ICD-10-CM | POA: Insufficient documentation

## 2019-12-07 DIAGNOSIS — Z9049 Acquired absence of other specified parts of digestive tract: Secondary | ICD-10-CM | POA: Diagnosis not present

## 2019-12-07 DIAGNOSIS — R918 Other nonspecific abnormal finding of lung field: Secondary | ICD-10-CM | POA: Diagnosis not present

## 2019-12-07 DIAGNOSIS — C50912 Malignant neoplasm of unspecified site of left female breast: Secondary | ICD-10-CM | POA: Insufficient documentation

## 2019-12-07 LAB — POCT URINALYSIS DIPSTICK
Bilirubin, UA: NEGATIVE
Blood, UA: NEGATIVE
Glucose, UA: NEGATIVE
Ketones, UA: NEGATIVE
Leukocytes, UA: NEGATIVE
Nitrite, UA: NEGATIVE
Protein, UA: NEGATIVE
Spec Grav, UA: >=1.03 — AB (ref 1.010–1.025)
Urobilinogen, UA: 1 E.U./dL
pH, UA: 5 (ref 5.0–8.0)

## 2019-12-07 LAB — POCT UA - MICROALBUMIN
Albumin/Creatinine Ratio, Urine, POC: 30
Creatinine, POC: 300 mg/dL
Microalbumin Ur, POC: 10 mg/L

## 2019-12-07 MED ORDER — ANASTROZOLE 1 MG PO TABS: 1.0000 mg | ORAL_TABLET | Freq: Every day | ORAL | 4 refills | Status: DC

## 2019-12-07 MED ORDER — GABAPENTIN 300 MG PO CAPS: 300.0000 mg | ORAL_CAPSULE | Freq: Every day | ORAL | 4 refills | Status: DC

## 2019-12-07 NOTE — Patient Instructions (Signed)
[name] , Thank you for taking time to come for your Medicare Wellness Visit. I appreciate your ongoing commitment to your health goals. Please review the following plan we discussed and let me know if I can assist you in the future.   Screening recommendations/referrals: Colonoscopy: 07/2018 Mammogram: 11/2019 Bone Density: 12/2017 Recommended yearly ophthalmology/optometry visit for glaucoma screening and checkup Recommended yearly dental visit for hygiene and checkup  Vaccinations: Influenza vaccine: 04/2019 Pneumococcal vaccine: declined (just had covid vaccines) Tdap vaccine: 10/2018 Shingles vaccine: discussed    Advanced directives: Advance directive discussed with you today. Even though you declined this today please call our office should you change your mind and we can give you the proper paperwork for you to fill out.   Conditions/risks identified: obesity  Next appointment: 06/11/2020 at 10:30   Preventive Care 65 Years and Older, Female Preventive care refers to lifestyle choices and visits with your health care provider that can promote health and wellness. What does preventive care include?  A yearly physical exam. This is also called an annual well check.  Dental exams once or twice a year.  Routine eye exams. Ask your health care provider how often you should have your eyes checked.  Personal lifestyle choices, including:  Daily care of your teeth and gums.  Regular physical activity.  Eating a healthy diet.  Avoiding tobacco and drug use.  Limiting alcohol use.  Practicing safe sex.  Taking low-dose aspirin every day.  Taking vitamin and mineral supplements as recommended by your health care provider. What happens during an annual well check? The services and screenings done by your health care provider during your annual well check will depend on your age, overall health, lifestyle risk factors, and family history of disease. Counseling  Your  health care provider may ask you questions about your:  Alcohol use.  Tobacco use.  Drug use.  Emotional well-being.  Home and relationship well-being.  Sexual activity.  Eating habits.  History of falls.  Memory and ability to understand (cognition).  Work and work Statistician.  Reproductive health. Screening  You may have the following tests or measurements:  Height, weight, and BMI.  Blood pressure.  Lipid and cholesterol levels. These may be checked every 5 years, or more frequently if you are over 64 years old.  Skin check.  Lung cancer screening. You may have this screening every year starting at age 81 if you have a 30-pack-year history of smoking and currently smoke or have quit within the past 15 years.  Fecal occult blood test (FOBT) of the stool. You may have this test every year starting at age 39.  Flexible sigmoidoscopy or colonoscopy. You may have a sigmoidoscopy every 5 years or a colonoscopy every 10 years starting at age 10.  Hepatitis C blood test.  Hepatitis B blood test.  Sexually transmitted disease (STD) testing.  Diabetes screening. This is done by checking your blood sugar (glucose) after you have not eaten for a while (fasting). You may have this done every 1-3 years.  Bone density scan. This is done to screen for osteoporosis. You may have this done starting at age 45.  Mammogram. This may be done every 1-2 years. Talk to your health care provider about how often you should have regular mammograms. Talk with your health care provider about your test results, treatment options, and if necessary, the need for more tests. Vaccines  Your health care provider may recommend certain vaccines, such as:  Influenza vaccine.  This is recommended every year.  Tetanus, diphtheria, and acellular pertussis (Tdap, Td) vaccine. You may need a Td booster every 10 years.  Zoster vaccine. You may need this after age 58.  Pneumococcal 13-valent  conjugate (PCV13) vaccine. One dose is recommended after age 4.  Pneumococcal polysaccharide (PPSV23) vaccine. One dose is recommended after age 62. Talk to your health care provider about which screenings and vaccines you need and how often you need them. This information is not intended to replace advice given to you by your health care provider. Make sure you discuss any questions you have with your health care provider. Document Released: 09/28/2015 Document Revised: 05/21/2016 Document Reviewed: 07/03/2015 Elsevier Interactive Patient Education  2017 [place] Prevention in the Home Falls can cause injuries. They can happen to people of all ages. There are many things you can do to make your home safe and to help prevent falls. What can I do on the outside of my home?  Regularly fix the edges of walkways and driveways and fix any cracks.  Remove anything that might make you trip as you walk through a door, such as a raised step or threshold.  Trim any bushes or trees on the path to your home.  Use bright outdoor lighting.  Clear any walking paths of anything that might make someone trip, such as rocks or tools.  Regularly check to see if handrails are loose or broken. Make sure that both sides of any steps have handrails.  Any raised decks and porches should have guardrails on the edges.  Have any leaves, snow, or ice cleared regularly.  Use sand or salt on walking paths during winter.  Clean up any spills in your garage right away. This includes oil or grease spills. What can I do in the bathroom?  Use night lights.  Install grab bars by the toilet and in the tub and shower. Do not use towel bars as grab bars.  Use non-skid mats or decals in the tub or shower.  If you need to sit down in the shower, use a plastic, non-slip stool.  Keep the floor dry. Clean up any water that spills on the floor as soon as it happens.  Remove soap buildup in the tub or  shower regularly.  Attach bath mats securely with double-sided non-slip rug tape.  Do not have throw rugs and other things on the floor that can make you trip. What can I do in the bedroom?  Use night lights.  Make sure that you have a light by your bed that is easy to reach.  Do not use any sheets or blankets that are too big for your bed. They should not hang down onto the floor.  Have a firm chair that has side arms. You can use this for support while you get dressed.  Do not have throw rugs and other things on the floor that can make you trip. What can I do in the kitchen?  Clean up any spills right away.  Avoid walking on wet floors.  Keep items that you use a lot in easy-to-reach places.  If you need to reach something above you, use a strong step stool that has a grab bar.  Keep electrical cords out of the way.  Do not use floor polish or wax that makes floors slippery. If you must use wax, use non-skid floor wax.  Do not have throw rugs and other things on the floor that can make  you trip. What can I do with my stairs?  Do not leave any items on the stairs.  Make sure that there are handrails on both sides of the stairs and use them. Fix handrails that are broken or loose. Make sure that handrails are as long as the stairways.  Check any carpeting to make sure that it is firmly attached to the stairs. Fix any carpet that is loose or worn.  Avoid having throw rugs at the top or bottom of the stairs. If you do have throw rugs, attach them to the floor with carpet tape.  Make sure that you have a light switch at the top of the stairs and the bottom of the stairs. If you do not have them, ask someone to add them for you. What else can I do to help prevent falls?  Wear shoes that:  Do not have high heels.  Have rubber bottoms.  Are comfortable and fit you well.  Are closed at the toe. Do not wear sandals.  If you use a stepladder:  Make sure that it is fully  opened. Do not climb a closed stepladder.  Make sure that both sides of the stepladder are locked into place.  Ask someone to hold it for you, if possible.  Clearly mark and make sure that you can see:  Any grab bars or handrails.  First and last steps.  Where the edge of each step is.  Use tools that help you move around (mobility aids) if they are needed. These include:  Canes.  Walkers.  Scooters.  Crutches.  Turn on the lights when you go into a dark area. Replace any light bulbs as soon as they burn out.  Set up your furniture so you have a clear path. Avoid moving your furniture around.  If any of your floors are uneven, fix them.  If there are any pets around you, be aware of where they are.  Review your medicines with your doctor. Some medicines can make you feel dizzy. This can increase your chance of falling. Ask your doctor what other things that you can do to help prevent falls. This information is not intended to replace advice given to you by your health care provider. Make sure you discuss any questions you have with your health care provider. Document Released: 06/28/2009 Document Revised: 02/07/2016 Document Reviewed: 10/06/2014 Elsevier Interactive Patient Education  2017 [employer].

## 2019-12-07 NOTE — Progress Notes (Signed)
This visit occurred during the SARS-CoV-2 public health emergency.  Safety protocols were in place, including screening questions prior to the visit, additional usage of staff PPE, and extensive cleaning of exam room while observing appropriate contact time as indicated for disinfecting solutions.  Subjective:     [name] , female    DOB: 27-Apr-1951 , 69 y.o.   MRN: [id number]   Chief Complaint  Patient presents with  . Annual Exam    HPI  Here for HM  She had covid vaccine and did well.   Wt Readings from Last 3 Encounters: 12/07/19 : 233 lb 11 oz (106 kg) 12/07/19 : 233 lb 9.6 oz (106 kg) 05/12/19 : 231 lb 9.6 oz (105.1 kg)   Hypertension This is a chronic problem. The current episode started more than 1 year ago. The problem is unchanged. The problem is controlled. Pertinent negatives include no anxiety, chest pain or palpitations. There are no associated agents to hypertension. Risk factors for coronary artery disease include sedentary lifestyle. Past treatments include diuretics and ACE inhibitors. Compliance problems include exercise.  There is no history of angina. There is no history of chronic renal disease.    The patient states she uses status post hysterectomy for birth control.  Negative for Dysmenorrhea and Negative for Menorrhagia Mammogram last done 11/15/2019. Negative for: breast discharge, breast lump(s), breast pain and breast self exam.  Pertinent negatives include abnormal bleeding (hematology), anxiety, decreased libido, depression, difficulty falling sleep, dyspareunia, history of infertility, nocturia, sexual dysfunction, sleep disturbances, urinary incontinence, urinary urgency, vaginal discharge and vaginal itching. Diet regular. The patient states her exercise level is minimal.  She is walking since it is getting warm. She does do chair exercises.  The patient's tobacco use is:  Social History   Tobacco Use  Smoking Status Never  Smoker  Smokeless Tobacco Never Used   She has been exposed to passive smoke. The patient's alcohol use is:  Social History   Substance and Sexual Activity  Alcohol Use Not Currently   Comment: occ-rare   Additional information: Last pap hysterectomy  Past Medical History:  Diagnosis Date  . Colon cancer ([place])   . Hx of rotator cuff surgery   . left breast cancer   . Personal history of chemotherapy 2016   left  . Personal history of radiation therapy 2016   left breast   . Radiation 11/15/14-01/02/15   left breast, axillary and supraclavicular region 45 gray, lumpectomy cavity boosted to 16 gray     Family History  Problem Relation Age of Onset  . Cancer Mother 50       breast  . Breast cancer Mother        early 24s  . Stroke Father 48       cause of death  . Diabetes Father   . Cancer Maternal Aunt        breast cancer at unknown age  . Breast cancer Maternal Aunt   . Ovarian cancer Neg Hx      Current Outpatient Medications:  .  acetaminophen (TYLENOL) 500 MG tablet, Take 1,000 mg by mouth every 6 (six) hours as needed for mild pain or moderate pain., Disp: , Rfl:  .  anastrozole (ARIMIDEX) 1 MG tablet, Take 1 tablet (1 mg total) by mouth daily., Disp: 90 tablet, Rfl: 4 .  gabapentin (NEURONTIN) 300 MG capsule, TAKE 1 CAPSULE BY MOUTH EVERYDAY AT BEDTIME, Disp: 90 capsule, Rfl: 0 .  lisinopril-hydrochlorothiazide (ZESTORETIC)  20-12.5 MG tablet, Take 1/2 tablet daily by mouth, Disp: 30 tablet, Rfl: 6 .  potassium chloride (K-DUR) 10 MEQ tablet, Take 1 tablet (10 mEq total) by mouth 2 (two) times daily. (Patient not taking: Reported on 12/07/2019), Disp: 60 tablet, Rfl: 4 .  ursodiol (ACTIGALL) 500 MG tablet, Take 500 mg by mouth 3 (three) times daily., Disp: , Rfl:    No Known Allergies   Review of Systems  Constitutional: Negative.   HENT: Negative.   Eyes: Negative.   Respiratory: Negative.   Cardiovascular: Negative.  Negative for chest pain, palpitations and  leg swelling.  Gastrointestinal: Negative.   Endocrine: Negative.   Genitourinary: Negative.   Musculoskeletal: Negative.   Skin: Negative.   Allergic/Immunologic: Negative.   Neurological: Negative.   Hematological: Negative.   Psychiatric/Behavioral: Negative.      Today's Vitals   12/07/19 1044  BP: 124/82  Pulse: 97  Temp: 98.5 F (36.9 C)  TempSrc: Oral  Weight: 233 lb 11 oz (106 kg)  Height: 5' 1.6" (1.565 m)  PainSc: 0-No pain   Body mass index is 43.3 kg/m.   Objective:  Physical Exam Constitutional:      Appearance: Normal appearance. She is well-developed.  HENT:     Head: Normocephalic and atraumatic.     Right Ear: Hearing, tympanic membrane, ear canal and external ear normal.     Left Ear: Hearing, tympanic membrane, ear canal and external ear normal.     Nose:     Comments: Deferred - masked    Mouth/Throat:     Comments: Deferred - masked   Eyes:     General: Lids are normal.     Conjunctiva/sclera: Conjunctivae normal.     Pupils: Pupils are equal, round, and reactive to light.     Funduscopic exam:    Right eye: No papilledema.        Left eye: No papilledema.  Neck:     Thyroid: No thyroid mass.     Vascular: No carotid bruit.  Cardiovascular:     Rate and Rhythm: Normal rate and regular rhythm.     Pulses: Normal pulses.     Heart sounds: Normal heart sounds. No murmur.  Pulmonary:     Effort: Pulmonary effort is normal.     Breath sounds: Normal breath sounds.  Abdominal:     General: Abdomen is flat. Bowel sounds are normal. There is no distension.     Palpations: Abdomen is soft.  Musculoskeletal:        General: No swelling. Normal range of motion.     Cervical back: Full passive range of motion without pain, normal range of motion and neck supple.     Right lower leg: No edema.     Left lower leg: No edema.  Skin:    General: Skin is warm and dry.     Capillary Refill: Capillary refill takes less than 2 seconds.  Neurological:      General: No focal deficit present.     Mental Status: She is alert and oriented to person, place, and time.     Cranial Nerves: No cranial nerve deficit.     Sensory: No sensory deficit.  Psychiatric:        Mood and Affect: Mood normal.        Behavior: Behavior normal.        Thought Content: Thought content normal.        Judgment: Judgment normal.  Assessment And Plan:     1. Health maintenance examination Behavior modifications discussed and diet history reviewed.   Pt will continue to exercise regularly and modify diet with low GI, plant based foods and decrease intake of processed foods.  Recommend intake of daily multivitamin, Vitamin D, and calcium.  Recommend mammogram and colonoscopy for preventive screenings, as well as recommend immunizations that include influenza, TDAP - CBC - Lipid panel - Hemoglobin A1c  2. Essential hypertension  Chronic,  Good control  Continue with current medications  EKG done with NSR HR 96 - EKG 12-Lead - POCT Urinalysis Dipstick (81002) - POCT UA - Microalbumin - CMP14+EGFR  3. Obesity, morbid, BMI 40.0-49.9 (HCC)  Chronic, encouraged to increase her physical activity as tolerated.  Eat healthy diet low in carbohydrates and sugar  4. Malignant neoplasm of upper-outer quadrant of left breast in female, estrogen receptor negative ([place])  She is being followed by Dr. Jana Hakim,   Currently taking anastrozole  Minette Brine, FNP    THE PATIENT IS ENCOURAGED TO PRACTICE SOCIAL DISTANCING DUE TO THE COVID-19 PANDEMIC.

## 2019-12-07 NOTE — Progress Notes (Signed)
This visit occurred during the SARS-CoV-2 public health emergency.  Safety protocols were in place, including screening questions prior to the visit, additional usage of staff PPE, and extensive cleaning of exam room while observing appropriate contact time as indicated for disinfecting solutions.  Subjective:   [name] is a 69 y.o. female who presents for Medicare Annual (Subsequent) preventive examination.  Review of Systems:  n/a Cardiac Risk Factors include: advanced age (>38men, >72 women);obesity (BMI >30kg/m2);sedentary lifestyle     Objective:     Vitals: BP 124/82 (BP Location: Right Arm, Patient Position: Sitting, Cuff Size: Large)   Pulse 97   Temp 98.5 F (36.9 C) (Oral)   Ht 5' 1.6" (1.565 m)   Wt 233 lb 9.6 oz (106 kg)   SpO2 99%   BMI 43.28 kg/m   Body mass index is 43.28 kg/m.  Advanced Directives 12/07/2019 11/11/2018 12/21/2017 12/15/2017 04/24/2016 10/11/2015 10/29/2014  Does Patient Have a Medical Advance Directive? No No Yes No No No No  Type of Advance Directive - - Living will - - - -  Does patient want to make changes to medical advance directive? - - No - Patient declined - - - -  Copy of [place] in [place]  Would patient like information on creating a medical advance directive? No - Patient declined No - Patient declined - - - - -    Tobacco Social History   Tobacco Use  Smoking Status Never Smoker  Smokeless Tobacco Never Used     Counseling given: Not Answered   Clinical Intake:  Pre-visit preparation completed: Yes  Pain : No/denies pain     Nutritional Status: BMI > 30  Obese Nutritional Risks: None Diabetes: No  How often do you need to have someone help you when you read instructions, pamphlets, or other written materials from your doctor or pharmacy?: 1 - Never What is the last grade level you completed in school?: 12th grade  Interpreter Needed?: No  Information entered by :: NAllen  LPN  Past Medical History:  Diagnosis Date  . Colon cancer ([place])   . Hx of rotator cuff surgery   . left breast cancer   . Personal history of chemotherapy 2016   left  . Personal history of radiation therapy 2016   left breast   . Radiation 11/15/14-01/02/15   left breast, axillary and supraclavicular region 45 gray, lumpectomy cavity boosted to 16 gray   Past Surgical History:  Procedure Laterality Date  . ABDOMINAL HYSTERECTOMY  1988  . BREAST EXCISIONAL BIOPSY Right 12/21/2017  . BREAST LUMPECTOMY Left 2016  . COLON SURGERY  1988   colectomy/colostomy-ca  . COLON SURGERY  1988   colostomy takedown-reversal  . COLONOSCOPY    . EXCISION / BIOPSY BREAST / NIPPLE / DUCT Left 09/20/2014  . RADIOACTIVE SEED GUIDED EXCISIONAL BREAST BIOPSY Right 12/21/2017   Procedure: RIGHT RADIOACTIVE SEED GUIDED EXCISIONAL BREAST BIOPSY ERAS PATHWAY;  Surgeon: Rolm Bookbinder, MD;  Location: [facility];  Service: General;  Laterality: Right;  . RADIOACTIVE SEED GUIDED PARTIAL MASTECTOMY WITH AXILLARY SENTINEL LYMPH NODE BIOPSY Left 09/20/2014   Procedure: RADIOACTIVE SEED GUIDED LEFT BREAST LUMPECTOMY WITH AXILLARY SENTINEL LYMPH NODE BIOPSY;  Surgeon: Rolm Bookbinder, MD;  Location: [facility];  Service: General;  Laterality: Left;  . TOTAL SHOULDER ARTHROPLASTY  2009   right   Family History  Problem Relation Age of Onset  . Cancer  Mother 48       breast  . Breast cancer Mother        early 79s  . Stroke Father 12       cause of death  . Diabetes Father   . Cancer Maternal Aunt        breast cancer at unknown age  . Breast cancer Maternal Aunt   . Ovarian cancer Neg Hx    Social History   Socioeconomic History  . Marital status: Divorced    Spouse name: Not on file  . Number of children: 2  . Years of education: Not on file  . Highest education level: Not on file  Occupational History    Employer: [employer]    Comment: retired in 2008  Tobacco  Use  . Smoking status: Never Smoker  . Smokeless tobacco: Never Used  Substance and Sexual Activity  . Alcohol use: Not Currently    Comment: occ-rare  . Drug use: No  . Sexual activity: Not Currently  Other Topics Concern  . Not on file  Social History Narrative  . Not on file   Social Determinants of Health   Financial Resource Strain: Low Risk   . Difficulty of Paying Living Expenses: Not hard at all  Food Insecurity: No Food Insecurity  . Worried About Charity fundraiser in the Last Year: Never true  . Ran Out of Food in the Last Year: Never true  Transportation Needs: No Transportation Needs  . Lack of Transportation (Medical): No  . Lack of Transportation (Non-Medical): No  Physical Activity: Insufficiently Active  . Days of Exercise per Week: 2 days  . Minutes of Exercise per Session: 30 min  Stress: No Stress Concern Present  . Feeling of Stress : Not at all  Social Connections:   . Frequency of Communication with Friends and Family:   . Frequency of Social Gatherings with Friends and Family:   . Attends Religious Services:   . Active Member of Clubs or Organizations:   . Attends Archivist Meetings:   Marland Kitchen Marital Status:     Outpatient Encounter Medications as of 12/07/2019  Medication Sig  . acetaminophen (TYLENOL) 500 MG tablet Take 1,000 mg by mouth every 6 (six) hours as needed for mild pain or moderate pain.  Marland Kitchen anastrozole (ARIMIDEX) 1 MG tablet Take 1 tablet (1 mg total) by mouth daily.  Marland Kitchen gabapentin (NEURONTIN) 300 MG capsule TAKE 1 CAPSULE BY MOUTH EVERYDAY AT BEDTIME  . lisinopril-hydrochlorothiazide (ZESTORETIC) 20-12.5 MG tablet Take 1/2 tablet daily by mouth  . ursodiol (ACTIGALL) 500 MG tablet Take 500 mg by mouth 3 (three) times daily.  . potassium chloride (K-DUR) 10 MEQ tablet Take 1 tablet (10 mEq total) by mouth 2 (two) times daily. (Patient not taking: Reported on 12/07/2019)   No facility-administered encounter medications on file as  of 12/07/2019.    Activities of Daily Living In your present state of health, do you have any difficulty performing the following activities: 12/07/2019  Hearing? N  Vision? N  Difficulty concentrating or making decisions? N  Walking or climbing stairs? N  Dressing or bathing? N  Doing errands, shopping? N  Preparing Food and eating ? N  Using the Toilet? N  In the past six months, have you accidently leaked urine? N  Do you have problems with loss of bowel control? N  Managing your Medications? N  Managing your Finances? N  Housekeeping or managing your Housekeeping? N  Some recent data might be hidden    Patient Care Team: Minette Brine, FNP as PCP - General (General Practice) Magrinat, Virgie Dad, MD as Consulting Physician (Oncology) Rolm Bookbinder, MD as Consulting Physician (General Surgery) Gery Pray, MD as Consulting Physician (Radiation Oncology) [name], MD as Consulting Physician (Ophthalmology)    Assessment:   This is a routine wellness examination for [place].  Exercise Activities and Dietary recommendations Current Exercise Habits: Home exercise routine, Type of exercise: walking, Time (Minutes): 30, Frequency (Times/Week): 2, Weekly Exercise (Minutes/Week): 60  Goals    . Patient Stated (pt-stated)     No goals    . Patient Stated     12/07/2019, no goals       Fall Risk Fall Risk  12/07/2019 05/12/2019 11/11/2018 08/05/2018 04/06/2017  Falls in the past year? 0 0 0 0 No  Comment - - - Emmi Telephone Survey: data to providers prior to load Emmi Telephone Survey: data to providers prior to load  Risk for fall due to : Medication side effect - Medication side effect - -  Follow up Falls evaluation completed;Education provided;Falls prevention discussed - Education provided;Falls prevention discussed - -   Is the patient's home free of loose throw rugs in walkways, pet beds, electrical cords, etc?   yes      Grab bars in the bathroom? yes       Handrails on the stairs? yes      Adequate lighting?   yes  Timed Get Up and Go performed: n/a  Depression Screen PHQ 2/9 Scores 12/07/2019 05/12/2019 11/11/2018 10/04/2014  PHQ - 2 Score 0 0 0 0  PHQ- 9 Score 3 - 3 -     Cognitive Function     6CIT Screen 12/07/2019 11/11/2018  What Year? 0 points 0 points  What month? 0 points 0 points  What time? 0 points 0 points  Count back from 20 0 points 0 points  Months in reverse 0 points 0 points  Repeat phrase 2 points 0 points  Total Score 2 0    Immunization History  Administered Date(s) Administered  . Influenza, High Dose Seasonal PF 05/12/2019  . Moderna SARS-COVID-2 Vaccination 10/30/2019, 11/27/2019    Qualifies for Shingles Vaccine? yes  Screening Tests Health Maintenance  Topic Date Due  . PNA vac Low Risk Adult (1 of 2 - PCV13) 12/06/2020 (Originally 01/08/2016)  . MAMMOGRAM  11/14/2021  . COLONOSCOPY  08/13/2028  . TETANUS/TDAP  11/11/2028  . INFLUENZA VACCINE  Completed  . DEXA SCAN  Completed  . Hepatitis C Screening  Completed    Cancer Screenings: Lung: Low Dose CT Chest recommended if Age 62-80 years, 30 pack-year currently smoking OR have quit w/in 15years. Patient does not qualify. Breast:  Up to date on Mammogram? Yes   Up to date of Bone Density/Dexa? Yes Colorectal: up to date  Additional Screenings: : Hepatitis C Screening: 11/11/2018     Plan:    Patient has no goals set at this time.   I have personally reviewed and noted the following in the patient's chart:   . Medical and social history . Use of alcohol, tobacco or illicit drugs  . Current medications and supplements . Functional ability and status . Nutritional status . Physical activity . Advanced directives . List of other physicians . Hospitalizations, surgeries, and ER visits in previous 12 months . Vitals . Screenings to include cognitive, depression, and falls . Referrals and appointments  In addition, I have  reviewed and  discussed with patient certain preventive protocols, quality metrics, and best practice recommendations. A written personalized care plan for preventive services as well as general preventive health recommendations were provided to patient.     Kellie Simmering, LPN  [phone]

## 2019-12-08 ENCOUNTER — Telehealth: Payer: Self-pay | Admitting: Oncology

## 2019-12-08 LAB — CMP14+EGFR
ALT: 16 IU/L (ref 0–32)
AST: 12 IU/L (ref 0–40)
Albumin/Globulin Ratio: 1.3 (ref 1.2–2.2)
Albumin: 4.1 g/dL (ref 3.8–4.8)
Alkaline Phosphatase: 149 IU/L — ABNORMAL HIGH (ref 39–117)
BUN/Creatinine Ratio: 12 (ref 12–28)
BUN: 13 mg/dL (ref 8–27)
Bilirubin Total: 0.7 mg/dL (ref 0.0–1.2)
CO2: 22 mmol/L (ref 20–29)
Calcium: 10 mg/dL (ref 8.7–10.3)
Chloride: 103 mmol/L (ref 96–106)
Creatinine, Ser: 1.1 mg/dL — ABNORMAL HIGH (ref 0.57–1.00)
GFR calc Af Amer: 60 mL/min/{1.73_m2} (ref >59)
GFR calc non Af Amer: 52 mL/min/{1.73_m2} — ABNORMAL LOW (ref >59)
Globulin, Total: 3.1 g/dL (ref 1.5–4.5)
Glucose: 103 mg/dL — ABNORMAL HIGH (ref 65–99)
Potassium: 4.1 mmol/L (ref 3.5–5.2)
Sodium: 143 mmol/L (ref 134–144)
Total Protein: 7.2 g/dL (ref 6.0–8.5)

## 2019-12-08 LAB — CBC
Hematocrit: 39.3 % (ref 34.0–46.6)
Hemoglobin: 13.1 g/dL (ref 11.1–15.9)
MCH: 29.8 pg (ref 26.6–33.0)
MCHC: 33.3 g/dL (ref 31.5–35.7)
MCV: 89 fL (ref 79–97)
Platelets: 321 10*3/uL (ref 150–450)
RBC: 4.4 x10E6/uL (ref 3.77–5.28)
RDW: 12.7 % (ref 11.7–15.4)
WBC: 4.4 10*3/uL (ref 3.4–10.8)

## 2019-12-08 LAB — LIPID PANEL
Chol/HDL Ratio: 2.2 ratio (ref 0.0–4.4)
Cholesterol, Total: 167 mg/dL (ref 100–199)
HDL: 75 mg/dL (ref >39)
LDL Chol Calc (NIH): 74 mg/dL (ref 0–99)
Triglycerides: 98 mg/dL (ref 0–149)
VLDL Cholesterol Cal: 18 mg/dL (ref 5–40)

## 2019-12-08 LAB — HEMOGLOBIN A1C
Est. average glucose Bld gHb Est-mCnc: 120 mg/dL
Hgb A1c MFr Bld: 5.8 % — ABNORMAL HIGH (ref 4.8–5.6)

## 2019-12-08 NOTE — Telephone Encounter (Signed)
Scheduled appts per 3/24 los. Left a voicemail with new appt details. Mailed reminder letter and appt calendar.

## 2020-01-02 ENCOUNTER — Ambulatory Visit
Admission: RE | Admit: 2020-01-02 | Discharge: 2020-01-02 | Disposition: A | Discharge status: Home / Self Care | Payer: PPO | Source: Ambulatory Visit | Attending: Oncology | Admitting: Oncology

## 2020-01-02 DIAGNOSIS — Z853 Personal history of malignant neoplasm of breast: Secondary | ICD-10-CM | POA: Diagnosis not present

## 2020-01-02 DIAGNOSIS — C189 Malignant neoplasm of colon, unspecified: Secondary | ICD-10-CM

## 2020-01-02 DIAGNOSIS — C50412 Malignant neoplasm of upper-outer quadrant of left female breast: Secondary | ICD-10-CM

## 2020-01-02 MED ORDER — GADOBUTROL 1 MMOL/ML IV SOLN
10.0000 mL | Freq: Once | INTRAVENOUS | Status: AC | PRN
  Administered 2020-01-02: 10 mL via INTRAVENOUS

## 2020-01-04 ENCOUNTER — Other Ambulatory Visit: Payer: Self-pay | Admitting: Oncology

## 2020-01-04 DIAGNOSIS — C50919 Malignant neoplasm of unspecified site of unspecified female breast: Secondary | ICD-10-CM

## 2020-01-04 DIAGNOSIS — Z1589 Genetic susceptibility to other disease: Secondary | ICD-10-CM

## 2020-01-08 ENCOUNTER — Other Ambulatory Visit: Payer: Self-pay | Admitting: Oncology

## 2020-01-17 ENCOUNTER — Ambulatory Visit
Admission: RE | Admit: 2020-01-17 | Discharge: 2020-01-17 | Disposition: A | Discharge status: Home / Self Care | Payer: PPO | Source: Ambulatory Visit | Attending: Oncology | Admitting: Oncology

## 2020-01-17 ENCOUNTER — Other Ambulatory Visit: Payer: Self-pay

## 2020-01-17 ENCOUNTER — Other Ambulatory Visit: Payer: Self-pay | Admitting: Oncology

## 2020-01-17 DIAGNOSIS — Z1502 Genetic susceptibility to malignant neoplasm of ovary: Secondary | ICD-10-CM

## 2020-01-17 DIAGNOSIS — R928 Other abnormal and inconclusive findings on diagnostic imaging of breast: Secondary | ICD-10-CM | POA: Diagnosis not present

## 2020-01-17 DIAGNOSIS — Z853 Personal history of malignant neoplasm of breast: Secondary | ICD-10-CM | POA: Diagnosis not present

## 2020-01-17 DIAGNOSIS — C50919 Malignant neoplasm of unspecified site of unspecified female breast: Secondary | ICD-10-CM

## 2020-01-17 DIAGNOSIS — Z1589 Genetic susceptibility to other disease: Secondary | ICD-10-CM

## 2020-01-17 DIAGNOSIS — N6489 Other specified disorders of breast: Secondary | ICD-10-CM | POA: Diagnosis not present

## 2020-01-17 DIAGNOSIS — Z1509 Genetic susceptibility to other malignant neoplasm: Secondary | ICD-10-CM

## 2020-01-20 ENCOUNTER — Other Ambulatory Visit (HOSPITAL_COMMUNITY): Payer: Self-pay | Admitting: Diagnostic Radiology

## 2020-01-20 ENCOUNTER — Ambulatory Visit
Admission: RE | Admit: 2020-01-20 | Discharge: 2020-01-20 | Disposition: A | Discharge status: Home / Self Care | Payer: PPO | Source: Ambulatory Visit | Attending: Oncology | Admitting: Oncology

## 2020-01-20 ENCOUNTER — Other Ambulatory Visit: Payer: Self-pay

## 2020-01-20 DIAGNOSIS — C50919 Malignant neoplasm of unspecified site of unspecified female breast: Secondary | ICD-10-CM

## 2020-01-20 DIAGNOSIS — Z1589 Genetic susceptibility to other disease: Secondary | ICD-10-CM

## 2020-01-20 DIAGNOSIS — Z1502 Genetic susceptibility to malignant neoplasm of ovary: Secondary | ICD-10-CM

## 2020-01-20 DIAGNOSIS — N6325 Unspecified lump in the left breast, overlapping quadrants: Secondary | ICD-10-CM | POA: Diagnosis not present

## 2020-01-20 DIAGNOSIS — C50812 Malignant neoplasm of overlapping sites of left female breast: Secondary | ICD-10-CM | POA: Diagnosis not present

## 2020-01-24 ENCOUNTER — Other Ambulatory Visit: Payer: Self-pay | Admitting: Oncology

## 2020-01-24 DIAGNOSIS — C50912 Malignant neoplasm of unspecified site of left female breast: Secondary | ICD-10-CM | POA: Diagnosis not present

## 2020-01-25 ENCOUNTER — Telehealth: Payer: Self-pay | Admitting: *Deleted

## 2020-01-25 DIAGNOSIS — C50412 Malignant neoplasm of upper-outer quadrant of left female breast: Secondary | ICD-10-CM

## 2020-01-25 NOTE — Telephone Encounter (Signed)
Spoke with patient to confirm appointment for Dr. Jana Hakim for 01/30/20.  Lab at 930 and see him at 10am.  Patient aware.  She may be a few minutes late due to work.

## 2020-01-25 NOTE — Telephone Encounter (Signed)
Ordered oncotype (core) per Dr. Magrinat.  Faxed requisition to pathology. 

## 2020-01-29 NOTE — Progress Notes (Signed)
[name]  [phone] [phone]     ID: [name] DOB: [birth year]  MR#: [id number]  UTM#:[id number]  Patient Care Team: Glendale Chard, MD as PCP - General (Internal Medicine) Amarya Kuehl, Virgie Dad, MD as Consulting Physician (Oncology) Rolm Bookbinder, MD as Consulting Physician (General Surgery) Gery Pray, MD as Consulting Physician (Radiation Oncology) [name], MD as Consulting Physician (Ophthalmology)   CHIEF COMPLAINT: triple negative breast cancer; PALB2 positive  CURRENT TREATMENT: Definitive surgery pending, to be followed by fulvestrant and palbociclib    INTERVAL HISTORY: [name] returns today for evaluation of her now-recurrent left breast cancer.  Since her last visit, she underwent breast MRI on 01/02/2020 showing: breast composition B; indeterminate 2.2 cm left breast mass; no evidence of malignancy on right or in lymph nodes. Of note, mammogram on 11/15/2019 was negative.  She then underwent left diagnostic mammogram and left breast ultrasound on 01/17/2020 showing: breast density category B; suspicious 2 cm mass in the left breast at 12 o'clock; no evidence of lymphadenopathy.  She proceeded to biopsy of the left breast area in question. Performed on 01/20/2020, pathology from the procedure ([id number]) showed: invasive ductal carcinoma, at least grade 2. Prognostic panel significant for: estrogen receptor 70% positive with moderate staining intensity; progesterone receptor 0% negative. Proliferation marker Ki67 of 80%. Her2 negative by immunohistochemistry (0).  She had been on anastrozole.  She tolerated that well.  We are stopping that now that we have evidence of disease recurrence.  [name]'s last bone density screening on 12/31/2017, showed a T-score of -0.6, which is considered normal.    REVIEW OF SYSTEMS: [name] tells me she is doing fine but is overwhelmed by the news of her breast cancer recurrence.  She is  limited in what she can do but mostly stays around the house and visits with family.  A detailed review of systems today was otherwise stable   BREAST CANCER HISTORY: From the original intake note:  Le herself palpated a mass in her left breast, and immediately brought it to the attention of her primary care physician, Dr. Baird Cancer, who confirmed the finding and setup the patient for bilateral diagnostic mammography at the [facility] Center 02/20/2014. This showed a high density mass in the outer left breast measuring up to 3.7 cm. It corresponded to the site of palpable concern. In addition the normal 4 logically abnormal lymph nodes in the left axilla. The mass was palpable by exam, and by ultrasonography measured 2.6 cm. There were enlarged and morphologically abnormal lymph nodes in the left axilla, largest measuring 3 cm.  Biopsy of the left breast mass in question and 1 of the abnormal axillary lymph nodes 02/22/2014 showed ([id number]) biopsies to be positive for an invasive ductal carcinoma, grade 3, triple negative, with an MIB-1 of 90%. Specifically the HER-2 signals ratio was 1.05, and the number per cell 2.00.  MRI of the breasts 03/01/2014 showed a 3.8 cm enhancing mass with areas of apparent necrosis in the left breast, as well as the biopsy tract extending laterally, the combined measure 5.3 cm. There were no other masses or areas of abnormal enhancement. There were multiple abnormally enlarged left axillary lymph nodes with loss of the normal fatty hilum. These included level I and retropectoral lymph nodes.  The patient's subsequent history is as detailed below   PAST MEDICAL HISTORY: Past Medical History:  Diagnosis Date  . Colon cancer ([place])   . Hx of rotator cuff surgery   . left  breast cancer   . Personal history of chemotherapy 2016   left  . Personal history of radiation therapy 2016   left breast   . Radiation 11/15/14-01/02/15   left breast, axillary and supraclavicular  region 45 gray, lumpectomy cavity boosted to 16 gray    PAST SURGICAL HISTORY: Past Surgical History:  Procedure Laterality Date  . ABDOMINAL HYSTERECTOMY  1988  . BREAST EXCISIONAL BIOPSY Right 12/21/2017  . BREAST LUMPECTOMY Left 2016  . COLON SURGERY  1988   colectomy/colostomy-ca  . COLON SURGERY  1988   colostomy takedown-reversal  . COLONOSCOPY    . EXCISION / BIOPSY BREAST / NIPPLE / DUCT Left 09/20/2014  . RADIOACTIVE SEED GUIDED EXCISIONAL BREAST BIOPSY Right 12/21/2017   Procedure: RIGHT RADIOACTIVE SEED GUIDED EXCISIONAL BREAST BIOPSY ERAS PATHWAY;  Surgeon: Rolm Bookbinder, MD;  Location: [facility];  Service: General;  Laterality: Right;  . RADIOACTIVE SEED GUIDED PARTIAL MASTECTOMY WITH AXILLARY SENTINEL LYMPH NODE BIOPSY Left 09/20/2014   Procedure: RADIOACTIVE SEED GUIDED LEFT BREAST LUMPECTOMY WITH AXILLARY SENTINEL LYMPH NODE BIOPSY;  Surgeon: Rolm Bookbinder, MD;  Location: [facility];  Service: General;  Laterality: Left;  . TOTAL SHOULDER ARTHROPLASTY  2009   right    FAMILY HISTORY Family History  Problem Relation Age of Onset  . Cancer Mother 2       breast  . Breast cancer Mother        early 69s  . Stroke Father 22       cause of death  . Diabetes Father   . Cancer Maternal Aunt        breast cancer at unknown age  . Breast cancer Maternal Aunt   . Ovarian cancer Neg Hx    the patient's father died at the age of 2 following a stroke in the setting of diabetes; the patient's mother is living at 36. The patient has 3 brothers and 2 sisters. [name], [name], was diagnosed with breast cancer in her early 61s. There is no other breast or ovarian cancer in the family. The patient herself was diagnosed with watts by history he is an incidentally found stage I colon carcinoma noted at the time of her hysterectomy. She had a partial colectomy but no adjuvant treatment. We have no records regarding that  procedure   GYNECOLOGIC HISTORY:  No LMP recorded. Patient has had a hysterectomy. Menarche age 76, first live birth age 36, the patient underwent total abdominal hysterectomy with bilateral salpingo-oophorectomy in her 27s. She did not take hormone replacement. She did not take oral contraceptives at any point.   SOCIAL HISTORY: (Updated May 2021 Payzlee worked for [employer] for about 40 years, retiring in 2008. She is divorced and lives alone, with no pets.  Her grandson who is a Conservator, museum/gallery frequently stays with her.  Her daughter Sequita Wise works for [employer] in Press [employer]. The second daughter, Coral Timme, works as a Scientist, water quality at [employer].  The patient has 6 grandchildren and three great-grandchildren. She attends a [employer]. (updated 09/28/2018)    ADVANCED DIRECTIVES: Not in place. The patient intends to name her daughter Marcello Fennel. her healthcare power of attorney. [name] is work number is [phone]. On the patient's 03/01/2014 visit she was given a copy of the appropriate documents to complete and notarize at her discretion.     HEALTH MAINTENANCE: Social History   Tobacco Use  . Smoking status: Never Smoker  . Smokeless tobacco: Never  Used  Substance Use Topics  . Alcohol use: Not Currently    Comment: occ-rare  . Drug use: No     Colonoscopy: 2000  PAP: May 2015  Bone density: 12/21/2017, T-score of -0.6  Lipid panel: 02/03/2014  No Known Allergies  Current Outpatient Medications  Medication Sig Dispense Refill  . acetaminophen (TYLENOL) 500 MG tablet Take 1,000 mg by mouth every 6 (six) hours as needed for mild pain or moderate pain.    Marland Kitchen anastrozole (ARIMIDEX) 1 MG tablet Take 1 tablet (1 mg total) by mouth daily. 90 tablet 4  . gabapentin (NEURONTIN) 300 MG capsule Take 1 capsule (300 mg total) by mouth at bedtime. 90 capsule 4  . lisinopril-hydrochlorothiazide (ZESTORETIC) 20-12.5 MG tablet Take 1/2 tablet daily by mouth 30 tablet 6   . palbociclib (IBRANCE) 125 MG capsule Take 1 capsule (125 mg total) by mouth daily with breakfast. Take whole with food. Take for 21 days on, 7 days off, repeat every 28 days. Start February 14, 2020 21 capsule 6  . potassium chloride (K-DUR) 10 MEQ tablet Take 1 tablet (10 mEq total) by mouth 2 (two) times daily. (Patient not taking: Reported on 12/07/2019) 60 tablet 4  . ursodiol (ACTIGALL) 500 MG tablet Take 500 mg by mouth 3 (three) times daily.     No current facility-administered medications for this visit.    OBJECTIVE: African-American woman who appears older than stated age  75:   01/30/20 1003  BP: (!) 161/95  Pulse: 96  Resp: 18  Temp: 97.8 F (36.6 C)  SpO2: 100%    Body mass index is 42.95 kg/m.     ECOG FS:1 - Symptomatic but completely ambulatory  Sclerae unicteric, EOMs intact Wearing a mask No cervical or supraclavicular adenopathy Lungs no rales or rhonchi Heart regular rate and rhythm Abd soft, obese, nontender, positive bowel sounds MSK no focal spinal tenderness, no upper extremity lymphedema Neuro: nonfocal, well oriented, appropriate affect Breasts: The right breast is unremarkable.  The left breast is status post prior lumpectomy.  There is induration in the periareolar area, which is movable, and not erythematous.  Left axilla is benign.   LAB RESULTS:  No results found for: SPEP  Lab Results  Component Value Date   WBC 4.8 01/30/2020   NEUTROABS 2.9 01/30/2020   HGB 13.1 01/30/2020   HCT 39.9 01/30/2020   MCV 91.1 01/30/2020   PLT 281 01/30/2020      Chemistry      Component Value Date/Time   NA 143 12/07/2019 1742   NA 142 11/13/2016 1050   K 4.1 12/07/2019 1742   K 4.1 11/13/2016 1050   CL 103 12/07/2019 1742   CO2 22 12/07/2019 1742   CO2 27 11/13/2016 1050   BUN 13 12/07/2019 1742   BUN 11.5 11/13/2016 1050   CREATININE 1.10 (H) 12/07/2019 1742   CREATININE 1.0 11/13/2016 1050      Component Value Date/Time   CALCIUM 10.0  12/07/2019 1742   CALCIUM 9.8 11/13/2016 1050   ALKPHOS 149 (H) 12/07/2019 1742   ALKPHOS 140 11/13/2016 1050   AST 12 12/07/2019 1742   AST 13 11/13/2016 1050   ALT 16 12/07/2019 1742   ALT 15 11/13/2016 1050   BILITOT 0.7 12/07/2019 1742   BILITOT 0.86 11/13/2016 1050       No results found for: LABCA2  No components found for: LABCA125  No results for input(s): INR in the last 168 hours.  Urinalysis  Component Value Date/Time   COLORURINE YELLOW 04/05/2008 1120   APPEARANCEUR CLEAR 04/05/2008 1120   LABSPEC 1.021 04/05/2008 1120   PHURINE 6.0 04/05/2008 1120   GLUCOSEU NEGATIVE 04/05/2008 1120   HGBUR NEGATIVE 04/05/2008 1120   BILIRUBINUR negative 12/07/2019 1212   KETONESUR NEGATIVE 04/05/2008 1120   PROTEINUR Negative 12/07/2019 1212   PROTEINUR NEGATIVE 04/05/2008 1120   UROBILINOGEN 1.0 12/07/2019 1212   UROBILINOGEN 0.2 04/05/2008 1120   NITRITE negative 12/07/2019 1212   NITRITE NEGATIVE 04/05/2008 1120   LEUKOCYTESUR Negative 12/07/2019 1212    STUDIES: MR BREAST BILATERAL W WO CONTRAST INC CAD  Result Date: 01/03/2020 CLINICAL DATA:  69 year old female presenting for high risk screening MRI. Patient has history of treated left breast cancer status post lumpectomy in 2016. MRI guided biopsy of linear enhancement in the left breast in 2020 revealed sclerotic fibroadenomatoid nodule. 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 LABS:  None performed on site. EXAM: BILATERAL BREAST MRI WITH AND WITHOUT CONTRAST TECHNIQUE: Multiplanar, multisequence MR images of both breasts were obtained prior to and following the intravenous administration of 10 ml of Gadavist. Three-dimensional MR images were rendered by post-processing of the original MR data on an independent workstation. The three-dimensional MR images were interpreted, and findings are reported in the following complete MRI report for this study. Three  dimensional images were evaluated at the independent DynaCad workstation COMPARISON:  Previous exam(s). FINDINGS: Breast composition: b. Scattered fibroglandular tissue. Background parenchymal enhancement: Mild. Right breast: No suspicious mass or abnormal enhancement. Left breast: Post lumpectomy changes are noted in the central left breast. Susceptibility artifact from post biopsy clip is noted in the subareolar aspect at middle depth. This is seen in association with a 2.2 x 1.9 x 1.9 cm circumscribed mass (series 9, image 80/160). It demonstrates mild T2 hyperintensity with delayed, heterogeneous enhancement. No other mass or abnormal enhancement in the remainder of the left breast. Lymph nodes: No abnormal appearing lymph nodes. Ancillary findings:  None. IMPRESSION: 1. Indeterminate left breast mass (series 9, image 80). 2. No MRI evidence of malignancy on the right. 3. No suspicious lymphadenopathy. RECOMMENDATION: Recommendation is that the patient return for focal diagnostic evaluation of the left breast to include spot mammographic views and ultrasound for further evaluation of of a 2.2 cm enhancing mass. If no other imaging correlate can be identified, MRI guided biopsy is recommended. BI-RADS CATEGORY  4: Suspicious. Electronically Signed   By: Kristopher Oppenheim M.D.   On: 01/03/2020 15:23   US BREAST LTD UNI LEFT INC AXILLA  Result Date: 01/17/2020 CLINICAL DATA:  69 year old female with history of treated left breast cancer status post lumpectomy in 2016. She had MRI guided biopsy of linear enhancement in the left breast in 2020 with pathology indicating sclerotic fibroadenomatoid nodule. She had a follow-up high risk MRI 01/02/2020 which demonstrated an enhancing mass at the biopsy site. The exam today is for evaluation of this new mass. EXAM: DIGITAL DIAGNOSTIC UNILATERAL LEFT MAMMOGRAM WITH CAD AND TOMO LEFT BREAST ULTRASOUND COMPARISON:  Previous exam(s). ACR Breast Density Category b: There are  scattered areas of fibroglandular density. FINDINGS: Tomosynthesis images of the left breast demonstrates a developing mass at the biopsy site just anterior to the patient's lumpectomy site in the upper-outer quadrant. Mammographic images were processed with CAD. Ultrasound targeted to the left breast at 12 o'clock, 2 cm from the nipple demonstrates an oval hypoechoic mass with indistinct margins measuring 2.0 x 1.2 x  1.7 cm. Blood flow is seen within the mass on color Doppler imaging. Ultrasound of the left axilla demonstrates multiple normal-appearing lymph nodes. IMPRESSION: 1.  There is a suspicious mass in the left breast at 12 o'clock. 2.  No evidence of left axillary lymphadenopathy. RECOMMENDATION: Ultrasound guided biopsy is recommended for the left breast mass. This has been scheduled for 01/20/2020 at 12:45 p.m. I have discussed the findings and recommendations with the patient. If applicable, a reminder letter will be sent to the patient regarding the next appointment. BI-RADS CATEGORY  5: Highly suggestive of malignancy. Electronically Signed   By: Ammie Ferrier M.D.   On: 01/17/2020 09:42   MM DIAG BREAST TOMO UNI LEFT  Result Date: 01/17/2020 CLINICAL DATA:  69 year old female with history of treated left breast cancer status post lumpectomy in 2016. She had MRI guided biopsy of linear enhancement in the left breast in 2020 with pathology indicating sclerotic fibroadenomatoid nodule. She had a follow-up high risk MRI 01/02/2020 which demonstrated an enhancing mass at the biopsy site. The exam today is for evaluation of this new mass. EXAM: DIGITAL DIAGNOSTIC UNILATERAL LEFT MAMMOGRAM WITH CAD AND TOMO LEFT BREAST ULTRASOUND COMPARISON:  Previous exam(s). ACR Breast Density Category b: There are scattered areas of fibroglandular density. FINDINGS: Tomosynthesis images of the left breast demonstrates a developing mass at the biopsy site just anterior to the patient's lumpectomy site in the  upper-outer quadrant. Mammographic images were processed with CAD. Ultrasound targeted to the left breast at 12 o'clock, 2 cm from the nipple demonstrates an oval hypoechoic mass with indistinct margins measuring 2.0 x 1.2 x 1.7 cm. Blood flow is seen within the mass on color Doppler imaging. Ultrasound of the left axilla demonstrates multiple normal-appearing lymph nodes. IMPRESSION: 1.  There is a suspicious mass in the left breast at 12 o'clock. 2.  No evidence of left axillary lymphadenopathy. RECOMMENDATION: Ultrasound guided biopsy is recommended for the left breast mass. This has been scheduled for 01/20/2020 at 12:45 p.m. I have discussed the findings and recommendations with the patient. If applicable, a reminder letter will be sent to the patient regarding the next appointment. BI-RADS CATEGORY  5: Highly suggestive of malignancy. Electronically Signed   By: Ammie Ferrier M.D.   On: 01/17/2020 09:42   MM CLIP PLACEMENT LEFT  Result Date: 01/20/2020 CLINICAL DATA:  Status post ultrasound-guided core biopsy of mass in the 12 o'clock location of the LEFT breast. EXAM: DIAGNOSTIC LEFT MAMMOGRAM POST ULTRASOUND BIOPSY COMPARISON:  Previous exam(s). FINDINGS: Mammographic images were obtained following ultrasound guided biopsy of mass in the 12 o'clock location of the LEFT breast and placement of a ribbon shaped clip. The biopsy marking clip is in expected position at the site of biopsy. IMPRESSION: Appropriate positioning of the ribbon shaped biopsy marking clip at the site of biopsy in the mass at the 12 o'clock location of the LEFT breast. Final Assessment: Post Procedure Mammograms for Marker Placement Electronically Signed   By: Nolon Nations M.D.   On: 01/20/2020 13:50   [place] LT BREAST BX W LOC DEV 1ST LESION IMG BX SPEC US GUIDE  Addendum Date: 01/23/2020   ADDENDUM REPORT: 01/23/2020 14:47 ADDENDUM: Pathology revealed GRADE II INVASIVE DUCTAL CARCINOMA of the LEFT breast, 12 o'clock, ribbon  clip in mass. This was found to be concordant by Dr. Nolon Nations. Pathology results were discussed with the patient by telephone. The patient reported doing well after the biopsy with tenderness at the site. Post biopsy instructions  and care were reviewed and questions were answered. The patient was encouraged to call [facility] for any additional concerns. The patient is scheduled to see Dr. Rolm Bookbinder at [facility] on Jan 24, 2020. Pathology results reported by Stacie Acres RN on 01/23/2020. Electronically Signed   By: Nolon Nations M.D.   On: 01/23/2020 14:47   Result Date: 01/23/2020 CLINICAL DATA:  Patient presents for ultrasound-guided core biopsy of mass in the LEFT breast. EXAM: ULTRASOUND GUIDED LEFT BREAST CORE NEEDLE BIOPSY COMPARISON:  Previous exam(s). PROCEDURE: I met with the patient and we discussed the procedure of ultrasound-guided biopsy, including benefits and alternatives. We discussed the high likelihood of a successful procedure. We discussed the risks of the procedure, including infection, bleeding, tissue injury, clip migration, and inadequate sampling. Informed written consent was given. The usual time-out protocol was performed immediately prior to the procedure. Lesion quadrant: 12 o'clock LEFT breast Using sterile technique and 1% Lidocaine as local anesthetic, under direct ultrasound visualization, a 12 gauge spring-loaded device was used to perform biopsy of mass in the 12 o'clock location of the LEFT breast 2 centimeters from the nipple using a LATERAL to MEDIAL approach. At the conclusion of the procedure ribbon shaped tissue marker clip was deployed into the biopsy cavity. Follow up 2 view mammogram was performed and dictated separately. IMPRESSION: Ultrasound guided biopsy of LEFT breast mass. No apparent complications. Electronically Signed: By: Nolon Nations M.D. On: 01/20/2020 13:51      ASSESSMENT: 69 y.o. BRCA  negative [place] woman s/p left breast upper outer quadrant and left axillary lymph node biopsy 02/22/2014, both positive for a clinical T2 N2, stage IIIA invasive ductal carcinoma, grade 3, triple negative, with an MIB-1 of 90%  (1) neoadjuvant chemotherapy started 03/13/2014, with cyclophosphamide and doxorubicin in dose dense fashion x4, with Neulasta support on day 2, completed 04/24/2014, followed by weekly carboplatin and paclitaxel x12, paclitaxel reduced during cycle 5 and cycle 7 because of elevated bilirubin levels  (2) left lumpectomy and sentinel lymph node sampling 09/20/2014 showed a complete pathologic response.  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  (b) PALB2 mutations are known to increase the risk of breast and pancreatic cancer, possibly other cancers, but the data is preliminary  (c) the patient's 2 daughters have been tested and did not carry the gene  (d) Elise's MSH2 VUS has been reclassified as "likely benign"  (e) she is status post remote TAH/BSO  (f) intensified screening, with mammography in February and breast MRI in August planned  (6) staging studies showed right-sided lung nodules, too small to characterize, and a dominant right-sided thyroid nodule unchanged in size as compared to 2011; to be followed  (a) chest CT 04/24/2016 showed the small pulmonary nodules to be completely stable, consistent with benign findings.  (b) thyroid ultrasound--done on 07/22/2018 showed enlarged heterogeneous lobular and multinodular thyroid gland, TI-RADS category 3 nodules, not meeting criteria for further biopsy or dedicated imaging f/u.    (7) intraductal papilloma  biopsied right breast 11/06/2017  (a) status post right lumpectomy 12/21/2017 showing only ductal papilloma, no evidence of malignancy.  (8) anastrozole started 07/08/2018 for breast cancer prevention, discontinued 01/30/2020 with disease recurrence  (  a) bone density scan 01/01/2018 normal, T score -0.6  (9) RECURRENT DISEASE May 2021  (a) left breast overlapping biopsy 01/20/2020 shows a clinical  T2 N0, stage IIB invasive ductal carcinoma, grade 2 or 3, with moderate estrogen receptor positivity, progesterone receptor negative, HER-2 not amplified, MIB-1 of 80%  (b) definitive surgery pending  (c) CT of the chest and bone scan  (10) to start fulvestrant and palbociclib 02/14/2020   PLAN: Aicia has recurrent disease in her left breast.  This is a little different phenotypically from the earlier version and that we do have moderate estrogen positivity even though the progesterone receptor is negative.  We certainly would consider chemotherapy in this case (the tumor has a rapid proliferation fraction and is high-grade) but she has already had the best first-line treatments and we cannot repeat the doxorubicin.  Second line chemotherapy would not be expected to be as effective.  Instead I think it would make more sense to make use of the estrogen receptor positivity even though this occurred right through anastrozole.  We are going to go with fulvestrant, which essentially destroys the estrogen receptor, and we will add palbociclib for this recurrent disease.  Today we discussed the possible toxicities side effects and complications of these agents and I have entered the orders.  Hopefully we can get started by June 1 but of course that depends on the date of her surgery and how quickly she recovers from it.    She understands that given her PALB 2 mutation she is at risk of also developing breast cancer in the contralateral breast at some point but she very much does not want to proceed with  bilateral mastectomies  We will see her on June 1.  If she has not completely recovered by then we will move that visit to June 8 but at any rate she will start the palbociclib and fulvestrant on the same day so that we can most easily follow labs with each cycle  She is also being scheduled for CT of the chest and a bone scan given that we are dealing with recurrent disease  At sometime in the future we may consider a PARP inhibitor in her case and I am a pending the relevant data below  Total encounter time 40 minutes.*  , Virgie Dad, MD  01/30/20 10:35 AM Medical Oncology and Hematology [facility] [place], [place] [address] Tel. [phone]    Fax. [phone]   I, Wilburn Mylar, am acting as scribe for Dr. Virgie Dad. .  I, Lurline Del MD, have reviewed the above documentation for accuracy and completeness, and I agree with the above.   *Total Encounter Time as defined by the Centers for Medicare and Medicaid Services includes, in addition to the face-to-face time of a patient visit (documented in the note above) non-face-to-face time: obtaining and reviewing outside history, ordering and reviewing medications, tests or procedures, care coordination (communications with other health care professionals or caregivers) and documentation in the medical record.   As reported in [facility]: [id number] PMID: [id number] ASCO 2020: highlights in breast cancer [place]  [facility] 810 Targeting homologous recombination deficiency (HRD) is a promising treatment approach and the poly (ADP-ribose) polymerase (PAPR) inhibitors olaparib and talazoparib have been approved meanwhile for the treatment of metastatic breast cancer (mBC) patients harbouring BRCA1/2 germ-line mutations. Other genomic alterations, however, may also result in homologous recombination deficiency (HRD) but little is known about the potential activity of PARPi  in these tumours.  [facility] 048 evaluated the activity of olaparib in mBC patients with germline mutations associated with HRD other than BRCA (cohort 1) and somatic mutations including somatic (s) BRCA1/2 mutations (cohort 2) [4]. In total, 54 pretreated patients (75% ER-positive/HER2-negative; 19% TNBC) were accrued; the most common genomic alterations were mutations of ATM, CHEK2, PALB2 and somatic mutations of BRCA1/2. In cohort 1, a response rate of 33% was reported; all responses were seen in patients with germline PALB2 mutations and the response rate in this subgroup was 82%. In cohort 2, response rate was 31% with all confirmed responses observed in sBRCA mutant tumours; response rate in this subgroup was 50%.  While these data are clearly preliminary, results or [facility] 048 suggest that PAPR-inhibitors may have a role beyond germline BRCA mutation in breast cancer.

## 2020-01-30 ENCOUNTER — Telehealth: Payer: Self-pay | Admitting: Pharmacist

## 2020-01-30 ENCOUNTER — Inpatient Hospital Stay: Payer: PPO | Attending: Oncology

## 2020-01-30 ENCOUNTER — Telehealth: Payer: Self-pay

## 2020-01-30 ENCOUNTER — Other Ambulatory Visit: Payer: Self-pay

## 2020-01-30 ENCOUNTER — Inpatient Hospital Stay (HOSPITAL_BASED_OUTPATIENT_CLINIC_OR_DEPARTMENT_OTHER): Payer: PPO | Admitting: Oncology

## 2020-01-30 VITALS — BP 161/95 | HR 96 | Temp 97.8°F | Resp 18 | Ht 61.6 in | Wt 231.8 lb

## 2020-01-30 DIAGNOSIS — Z9079 Acquired absence of other genital organ(s): Secondary | ICD-10-CM | POA: Insufficient documentation

## 2020-01-30 DIAGNOSIS — Z170 Estrogen receptor positive status [ER+]: Secondary | ICD-10-CM | POA: Insufficient documentation

## 2020-01-30 DIAGNOSIS — Z9049 Acquired absence of other specified parts of digestive tract: Secondary | ICD-10-CM | POA: Diagnosis not present

## 2020-01-30 DIAGNOSIS — Z803 Family history of malignant neoplasm of breast: Secondary | ICD-10-CM | POA: Insufficient documentation

## 2020-01-30 DIAGNOSIS — C50919 Malignant neoplasm of unspecified site of unspecified female breast: Secondary | ICD-10-CM | POA: Diagnosis not present

## 2020-01-30 DIAGNOSIS — C50812 Malignant neoplasm of overlapping sites of left female breast: Secondary | ICD-10-CM | POA: Insufficient documentation

## 2020-01-30 DIAGNOSIS — Z1509 Genetic susceptibility to other malignant neoplasm: Secondary | ICD-10-CM

## 2020-01-30 DIAGNOSIS — C50412 Malignant neoplasm of upper-outer quadrant of left female breast: Secondary | ICD-10-CM

## 2020-01-30 DIAGNOSIS — Z85038 Personal history of other malignant neoplasm of large intestine: Secondary | ICD-10-CM | POA: Insufficient documentation

## 2020-01-30 DIAGNOSIS — Z171 Estrogen receptor negative status [ER-]: Secondary | ICD-10-CM | POA: Diagnosis not present

## 2020-01-30 DIAGNOSIS — Z1502 Genetic susceptibility to malignant neoplasm of ovary: Secondary | ICD-10-CM

## 2020-01-30 DIAGNOSIS — C50912 Malignant neoplasm of unspecified site of left female breast: Secondary | ICD-10-CM

## 2020-01-30 DIAGNOSIS — Z90722 Acquired absence of ovaries, bilateral: Secondary | ICD-10-CM | POA: Insufficient documentation

## 2020-01-30 DIAGNOSIS — C189 Malignant neoplasm of colon, unspecified: Secondary | ICD-10-CM | POA: Diagnosis not present

## 2020-01-30 DIAGNOSIS — Z90710 Acquired absence of both cervix and uterus: Secondary | ICD-10-CM | POA: Insufficient documentation

## 2020-01-30 DIAGNOSIS — Z1589 Genetic susceptibility to other disease: Secondary | ICD-10-CM | POA: Diagnosis not present

## 2020-01-30 LAB — CMP (CANCER CENTER ONLY)
ALT: 14 U/L (ref 0–44)
AST: 12 U/L — ABNORMAL LOW (ref 15–41)
Albumin: 3.8 g/dL (ref 3.5–5.0)
Alkaline Phosphatase: 135 U/L — ABNORMAL HIGH (ref 38–126)
Anion gap: 11 (ref 5–15)
BUN: 12 mg/dL (ref 8–23)
CO2: 26 mmol/L (ref 22–32)
Calcium: 10.3 mg/dL (ref 8.9–10.3)
Chloride: 105 mmol/L (ref 98–111)
Creatinine: 0.86 mg/dL (ref 0.44–1.00)
GFR, Est AFR Am: >60 mL/min (ref >60)
GFR, Estimated: >60 mL/min (ref >60)
Glucose, Bld: 94 mg/dL (ref 70–99)
Potassium: 4.1 mmol/L (ref 3.5–5.1)
Sodium: 142 mmol/L (ref 135–145)
Total Bilirubin: 1.3 mg/dL — ABNORMAL HIGH (ref 0.3–1.2)
Total Protein: 8 g/dL (ref 6.5–8.1)

## 2020-01-30 LAB — CBC WITH DIFFERENTIAL (CANCER CENTER ONLY)
Abs Immature Granulocytes: 0.01 10*3/uL (ref 0.00–0.07)
Basophils Absolute: 0 10*3/uL (ref 0.0–0.1)
Basophils Relative: 1 %
Eosinophils Absolute: 0.1 10*3/uL (ref 0.0–0.5)
Eosinophils Relative: 3 %
HCT: 39.9 % (ref 36.0–46.0)
Hemoglobin: 13.1 g/dL (ref 12.0–15.0)
Immature Granulocytes: 0 %
Lymphocytes Relative: 30 %
Lymphs Abs: 1.4 10*3/uL (ref 0.7–4.0)
MCH: 29.9 pg (ref 26.0–34.0)
MCHC: 32.8 g/dL (ref 30.0–36.0)
MCV: 91.1 fL (ref 80.0–100.0)
Monocytes Absolute: 0.3 10*3/uL (ref 0.1–1.0)
Monocytes Relative: 6 %
Neutro Abs: 2.9 10*3/uL (ref 1.7–7.7)
Neutrophils Relative %: 60 %
Platelet Count: 281 10*3/uL (ref 150–400)
RBC: 4.38 MIL/uL (ref 3.87–5.11)
RDW: 12.7 % (ref 11.5–15.5)
WBC Count: 4.8 10*3/uL (ref 4.0–10.5)
nRBC: 0 % (ref 0.0–0.2)

## 2020-01-30 MED ORDER — PALBOCICLIB 125 MG PO CAPS: 125.0000 mg | ORAL_CAPSULE | Freq: Every day | ORAL | 6 refills | Status: DC

## 2020-01-30 MED ORDER — PALBOCICLIB 125 MG PO TABS: 125.0000 mg | ORAL_TABLET | Freq: Every day | ORAL | 6 refills | Status: DC

## 2020-01-30 NOTE — Telephone Encounter (Signed)
Oral Oncology Patient Advocate Encounter  Received notification from [employer] that prior authorization for [name] is required.  PA submitted on CoverMyMeds Key [id number]   Status is pending  Oral Oncology Clinic will continue to follow.  [place] Patient [name] Phone [phone] Fax [phone] 01/30/2020 1:18 PM

## 2020-01-30 NOTE — Telephone Encounter (Signed)
Oral Oncology Pharmacist Encounter  Received new prescription for Ibrance (palbociclib) for the treatment of recurrent breast cancer in conjunction with fulvestrant, planned duration until disease progression or unacceptable drug toxicity. Plan to start on 02/14/20 along with fulvestrant injection appt.  CMP from 01/30/20 assessed, no relevant lab abnormalities. Prescription dose and frequency assessed.   Current medication list in Epic reviewed, no DDIs with palbociclib identified.  Prescription has been e-scribed to the [facility] for benefits analysis and approval.  Oral Oncology Clinic will continue to follow for insurance authorization, copayment issues, initial counseling and start date.  Darl Pikes, PharmD, BCPS, BCOP, CPP Hematology/Oncology Clinical Pharmacist Practitioner [facility] [place] [facility] [phone]  01/30/2020 11:27 AM

## 2020-01-31 ENCOUNTER — Telehealth: Payer: Self-pay | Admitting: Oncology

## 2020-01-31 LAB — CANCER ANTIGEN 27.29: CA 27.29: 27.5 U/mL (ref 0.0–38.6)

## 2020-01-31 NOTE — Telephone Encounter (Signed)
Scheduled appts per 5/17 los. Called pt to review appt details. Pt stated that she was having surger on 6/1 and wanted me to cancell appts made on 6/2. Pt request for me to send msg to GM asking when to reschedule 6/2 appts. Staff msg was sent to GM.

## 2020-02-01 ENCOUNTER — Other Ambulatory Visit: Payer: Self-pay | Admitting: General Surgery

## 2020-02-01 DIAGNOSIS — C50412 Malignant neoplasm of upper-outer quadrant of left female breast: Secondary | ICD-10-CM

## 2020-02-01 DIAGNOSIS — Z171 Estrogen receptor negative status [ER-]: Secondary | ICD-10-CM

## 2020-02-02 NOTE — Telephone Encounter (Addendum)
Oral Chemotherapy Pharmacist Encounter  [name] will pick up her [name] from [name] using a one time free voucher. She will then stop by the cancer center to sign the manufacturer application. She knows the plan is to hold on starting the [name] until the day she comes in for her Faslodex injection.  Patient Education I spoke with patient for overview of new oral chemotherapy medication: [name] (palbociclib) for the treatment of recurrent breast cancer in conjunction with fulvestrant, planned duration until disease progression or unacceptable drug toxicity. Plan to start along with fulvestrant injection appt.   Pt is doing well. Counseled patient on administration, dosing, side effects, monitoring, drug-food interactions, safe handling, storage, and disposal. Patient will take 1 tablet (125 mg total) by mouth daily. Take for 21 days on, 7 days off, repeat every 28 days.  Side effects include but not limited to: N/V, fatigue, decreased wbc, hair loss.    Reviewed with patient importance of keeping a medication schedule and plan for any missed doses.  [name] voiced understanding and appreciation. All questions answered. Medication handout placed in the mail.  Provided patient with [facility] phone number. Patient knows to call the office with questions or concerns. Oral Chemotherapy [facility] will continue to follow.  Darl Pikes, PharmD, BCPS, BCOP, CPP Hematology/Oncology Clinical Pharmacist Practitioner [facility] [phone]  02/02/2020 3:10 PM

## 2020-02-03 MED FILL — IBRANCE 125 MG TABS: 125 | 28 days supply | Qty: 21 | Fill #0

## 2020-02-06 NOTE — Pre-Procedure Instructions (Signed)
No Pharmacies Listed     Your procedure is scheduled on Tuesday June 1st.  Report to [facility] Main Entrance "A" at 5:30 A.M., and check in at the Admitting office.  Call this number if you have problems the morning of surgery:  [phone]  Call [phone] if you have any questions prior to your surgery date Monday-Friday 8am-4pm    Remember:  Do not eat after midnight the night before your surgery  You may drink clear liquids until 4:30 the morning of your surgery.   Clear liquids allowed are: Water, Non-Citrus Juices (without pulp), Carbonated Beverages, Clear Tea, Black Coffee Only, and Gatorade Patient Instructions  . The night before surgery:  o No food after midnight. ONLY clear liquids after midnight  . The day of surgery (if you do NOT have diabetes):  o Drink ONE (1) Pre-Surgery Clear Ensure as directed.   o This drink was given to you during your hospital  pre-op appointment visit. o The pre-op nurse will instruct you on the time to drink the  Pre-Surgery Ensure depending on your surgery time. o Finish the drink at the designated time by the pre-op nurse- 4:30 AM  o Nothing else to drink after completing the  Pre-Surgery Clear Ensure.         If you have questions, please contact your surgeon's office.     Take these medicines the morning of surgery with A SIP OF WATER  hydroxypropyl methylcellulose / hypromellose (ISOPTO TEARS / GONIOVISC) palbociclib (IBRANCE) ursodiol (ACTIGALL)  acetaminophen (TYLENOL) if needed  As of today, STOP taking any Aspirin (unless otherwise instructed by your surgeon) and Aspirin containing products, Aleve, Naproxen, Ibuprofen, Motrin, Advil, Goody's, BC's, all herbal medications, fish oil, and all vitamins.                      Do not wear jewelry, make up, or nail polish            Do not wear lotions, powders, perfumes/colognes, or deodorant.            Do not shave 48 hours prior to surgery.  Men may shave face and neck.             Do not bring valuables to the hospital.            [facility] is not responsible for any belongings or valuables.  Do NOT Smoke (Tobacco/Vapping) or drink Alcohol 24 hours prior to your procedure If you use a CPAP at night, you may bring all equipment for your overnight stay.   Contacts, glasses, dentures or bridgework may not be worn into surgery.      For patients admitted to the hospital, discharge time will be determined by your treatment team.   Patients discharged the day of surgery will not be allowed to drive home, and someone needs to stay with them for 24 hours.    Special instructions:   [place]- Preparing For Surgery  Before surgery, you can play an important role. Because skin is not sterile, your skin needs to be as free of germs as possible. You can reduce the number of germs on your skin by washing with CHG (chlorahexidine gluconate) Soap before surgery.  CHG is an antiseptic cleaner which kills germs and bonds with the skin to continue killing germs even after washing.    Oral Hygiene is also important to reduce your risk of infection.  Remember - BRUSH YOUR TEETH THE  MORNING OF SURGERY WITH YOUR REGULAR TOOTHPASTE  Please do not use if you have an allergy to CHG or antibacterial soaps. If your skin becomes reddened/irritated stop using the CHG.  Do not shave (including legs and underarms) for at least 48 hours prior to first CHG shower. It is OK to shave your face.  Please follow these instructions carefully.   1. Shower the NIGHT BEFORE SURGERY and the MORNING OF SURGERY with CHG Soap.   2. If you chose to wash your hair, wash your hair first as usual with your normal shampoo.  3. After you shampoo, rinse your hair and body thoroughly to remove the shampoo.  4. Use CHG as you would any other liquid soap. You can apply CHG directly to the skin and wash gently with a scrungie or a clean washcloth.   5. Apply the CHG Soap to your body ONLY FROM THE  NECK DOWN.  Do not use on open wounds or open sores. Avoid contact with your eyes, ears, mouth and genitals (private parts). Wash Face and genitals (private parts)  with your normal soap.   6. Wash thoroughly, paying special attention to the area where your surgery will be performed.  7. Thoroughly rinse your body with warm water from the neck down.  8. DO NOT shower/wash with your normal soap after using and rinsing off the CHG Soap.  9. Pat yourself dry with a CLEAN TOWEL.  10. Wear CLEAN PAJAMAS to bed the night before surgery, wear comfortable clothes the morning of surgery  11. Place CLEAN SHEETS on your bed the night of your first shower and DO NOT SLEEP WITH PETS.   Day of Surgery:   Do not apply any deodorants/lotions.  Please wear clean clothes to the hospital/surgery center.   Remember to brush your teeth WITH YOUR REGULAR TOOTHPASTE.   Please read over the following fact sheets that you were given.

## 2020-02-07 ENCOUNTER — Other Ambulatory Visit: Payer: Self-pay

## 2020-02-07 ENCOUNTER — Encounter (HOSPITAL_COMMUNITY)
Admission: RE | Admit: 2020-02-07 | Discharge: 2020-02-07 | Disposition: A | Discharge status: Home / Self Care | Payer: PPO | Source: Ambulatory Visit | Attending: General Surgery | Admitting: General Surgery

## 2020-02-07 ENCOUNTER — Encounter (HOSPITAL_COMMUNITY): Payer: Self-pay

## 2020-02-07 DIAGNOSIS — Z01812 Encounter for preprocedural laboratory examination: Secondary | ICD-10-CM | POA: Insufficient documentation

## 2020-02-07 LAB — BASIC METABOLIC PANEL
Anion gap: 11 (ref 5–15)
BUN: 9 mg/dL (ref 8–23)
CO2: 26 mmol/L (ref 22–32)
Calcium: 9.6 mg/dL (ref 8.9–10.3)
Chloride: 104 mmol/L (ref 98–111)
Creatinine, Ser: 0.82 mg/dL (ref 0.44–1.00)
GFR calc Af Amer: >60 mL/min (ref >60)
GFR calc non Af Amer: >60 mL/min (ref >60)
Glucose, Bld: 94 mg/dL (ref 70–99)
Potassium: 3.6 mmol/L (ref 3.5–5.1)
Sodium: 141 mmol/L (ref 135–145)

## 2020-02-07 LAB — CBC
HCT: 39.1 % (ref 36.0–46.0)
Hemoglobin: 12.3 g/dL (ref 12.0–15.0)
MCH: 29.6 pg (ref 26.0–34.0)
MCHC: 31.5 g/dL (ref 30.0–36.0)
MCV: 94.2 fL (ref 80.0–100.0)
Platelets: 297 10*3/uL (ref 150–400)
RBC: 4.15 MIL/uL (ref 3.87–5.11)
RDW: 12.7 % (ref 11.5–15.5)
WBC: 3.9 10*3/uL — ABNORMAL LOW (ref 4.0–10.5)
nRBC: 0 % (ref 0.0–0.2)

## 2020-02-07 NOTE — Progress Notes (Signed)
PCP [facility] Cancer, MD Cardiologist - pt denies Oncologist - Magrinat, MD  PPM/ICD - n/a   Chest x-ray - n/a EKG - 12/07/19 Stress Test - pt denies ECHO - 03/09/14 Cardiac Cath - pt denies    Blood Thinner Instructions: n/a Aspirin Instructions:n/a  ERAS Protcol - yes PRE-SURGERY Ensure or G2- ensure  COVID TEST- 02/10/20 @ 1030  Coronavirus Screening  Have you experienced the following symptoms:  Cough yes/no: No Fever (>100.46F)  yes/no: No Runny nose yes/no: No Sore throat yes/no: No Difficulty breathing/shortness of breath  yes/no: No  Have you or a family member traveled in the last 14 days and where? yes/no: No   If the patient indicates "YES" to the above questions, their PAT will be rescheduled to limit the exposure to others and, the surgeon will be notified. THE PATIENT WILL NEED TO BE ASYMPTOMATIC FOR 14 DAYS.   If the patient is not experiencing any of these symptoms, the PAT nurse will instruct them to NOT bring anyone with them to their appointment since they may have these symptoms or traveled as well.   Please remind your patients and families that hospital visitation restrictions are in effect and the importance of the restrictions.     Anesthesia review: n/a  Patient denies shortness of breath, fever, cough and chest pain at PAT appointment   All instructions explained to the patient, with a verbal understanding of the material. Patient agrees to go over the instructions while at home for a better understanding. Patient also instructed to self quarantine after being tested for COVID-19. The opportunity to ask questions was provided.

## 2020-02-08 ENCOUNTER — Ambulatory Visit (HOSPITAL_COMMUNITY)
Admission: RE | Admit: 2020-02-08 | Discharge: 2020-02-08 | Disposition: A | Discharge status: Home / Self Care | Payer: PPO | Source: Ambulatory Visit | Attending: Oncology | Admitting: Oncology

## 2020-02-08 DIAGNOSIS — Z171 Estrogen receptor negative status [ER-]: Secondary | ICD-10-CM

## 2020-02-08 DIAGNOSIS — Z1509 Genetic susceptibility to other malignant neoplasm: Secondary | ICD-10-CM | POA: Insufficient documentation

## 2020-02-08 DIAGNOSIS — C189 Malignant neoplasm of colon, unspecified: Secondary | ICD-10-CM | POA: Insufficient documentation

## 2020-02-08 DIAGNOSIS — Z1502 Genetic susceptibility to malignant neoplasm of ovary: Secondary | ICD-10-CM | POA: Diagnosis not present

## 2020-02-08 DIAGNOSIS — R937 Abnormal findings on diagnostic imaging of other parts of musculoskeletal system: Secondary | ICD-10-CM | POA: Diagnosis not present

## 2020-02-08 DIAGNOSIS — C50912 Malignant neoplasm of unspecified site of left female breast: Secondary | ICD-10-CM | POA: Diagnosis not present

## 2020-02-08 DIAGNOSIS — C50412 Malignant neoplasm of upper-outer quadrant of left female breast: Secondary | ICD-10-CM | POA: Diagnosis not present

## 2020-02-08 DIAGNOSIS — Z1589 Genetic susceptibility to other disease: Secondary | ICD-10-CM | POA: Diagnosis not present

## 2020-02-08 DIAGNOSIS — C50919 Malignant neoplasm of unspecified site of unspecified female breast: Secondary | ICD-10-CM | POA: Diagnosis not present

## 2020-02-08 DIAGNOSIS — R918 Other nonspecific abnormal finding of lung field: Secondary | ICD-10-CM | POA: Diagnosis not present

## 2020-02-08 MED ORDER — IOHEXOL 300 MG/ML  SOLN
100.0000 mL | Freq: Once | INTRAMUSCULAR | Status: AC | PRN
  Administered 2020-02-08: 100 mL via INTRAVENOUS

## 2020-02-08 MED ORDER — TECHNETIUM TC 99M MEDRONATE IV KIT: 20.0000 | PACK | Freq: Once | INTRAVENOUS | Status: DC | PRN

## 2020-02-09 DIAGNOSIS — C50412 Malignant neoplasm of upper-outer quadrant of left female breast: Secondary | ICD-10-CM | POA: Diagnosis not present

## 2020-02-09 DIAGNOSIS — Z170 Estrogen receptor positive status [ER+]: Secondary | ICD-10-CM | POA: Diagnosis not present

## 2020-02-10 ENCOUNTER — Other Ambulatory Visit (HOSPITAL_COMMUNITY)
Admission: RE | Admit: 2020-02-10 | Discharge: 2020-02-10 | Disposition: A | Discharge status: Home / Self Care | Payer: PPO | Source: Ambulatory Visit | Attending: General Surgery | Admitting: General Surgery

## 2020-02-10 ENCOUNTER — Other Ambulatory Visit: Payer: Self-pay | Admitting: Oncology

## 2020-02-10 ENCOUNTER — Telehealth: Payer: Self-pay | Admitting: *Deleted

## 2020-02-10 DIAGNOSIS — Z01812 Encounter for preprocedural laboratory examination: Secondary | ICD-10-CM | POA: Insufficient documentation

## 2020-02-10 DIAGNOSIS — Z20822 Contact with and (suspected) exposure to covid-19: Secondary | ICD-10-CM | POA: Diagnosis not present

## 2020-02-10 NOTE — Telephone Encounter (Signed)
Received oncotype results of 60 and triple negative per oncotype.  Team notified.

## 2020-02-10 NOTE — Progress Notes (Signed)
I called [name] and let her know both her scans show no evidence of metastatic disease.

## 2020-02-11 LAB — SARS CORONAVIRUS 2 (TAT 6-24 HRS): SARS Coronavirus 2: NEGATIVE

## 2020-02-13 NOTE — Anesthesia Preprocedure Evaluation (Addendum)
Anesthesia Evaluation  Patient identified by MRN, date of birth, ID band Patient awake    Reviewed: Allergy & Precautions, NPO status , Patient's Chart, lab work & pertinent test results  Airway Mallampati: II  TM Distance: >3 FB Neck ROM: Full    Dental no notable dental hx.    Pulmonary neg pulmonary ROS,    Pulmonary exam normal breath sounds clear to auscultation  stable     Cardiovascular hypertension, Pt. on medications Normal cardiovascular exam Rhythm:Regular Rate:Normal     Neuro/Psych negative neurological ROS  negative psych ROS   GI/Hepatic negative GI ROS, Neg liver ROS,   Endo/Other  Morbid obesity  Renal/GU negative Renal ROS     Musculoskeletal negative musculoskeletal ROS (+)   Abdominal (+) + obese,   Peds  Hematology negative hematology ROS (+)   Anesthesia Other Findings RECURRENT BREAST CANCER  Reproductive/Obstetrics                            Anesthesia Physical Anesthesia Plan  ASA: III  Anesthesia Plan: General and Regional   Post-op Pain Management: GA combined w/ Regional for post-op pain   Induction: Intravenous  PONV Risk Score and Plan: 3 and Ondansetron, Dexamethasone, Midazolam and Treatment may vary due to age or medical condition  Airway Management Planned:   Additional Equipment:   Intra-op Plan:   Post-operative Plan: Extubation in OR  Informed Consent: I have reviewed the patients History and Physical, chart, labs and discussed the procedure including the risks, benefits and alternatives for the proposed anesthesia with the patient or authorized representative who has indicated his/her understanding and acceptance.     Dental advisory given  Plan Discussed with: CRNA  Anesthesia Plan Comments:       Anesthesia Quick Evaluation

## 2020-02-14 ENCOUNTER — Observation Stay (HOSPITAL_COMMUNITY)
Admission: RE | Admit: 2020-02-14 | Discharge: 2020-02-15 | Disposition: A | Discharge status: Home / Self Care | Payer: PPO | Attending: General Surgery | Admitting: General Surgery

## 2020-02-14 ENCOUNTER — Other Ambulatory Visit: Payer: Self-pay

## 2020-02-14 ENCOUNTER — Encounter (HOSPITAL_COMMUNITY)
Admission: RE | Disposition: A | Discharge status: Home / Self Care | Payer: Self-pay | Source: Home / Self Care | Attending: General Surgery

## 2020-02-14 ENCOUNTER — Other Ambulatory Visit: Payer: Self-pay | Admitting: Oncology

## 2020-02-14 ENCOUNTER — Ambulatory Visit (HOSPITAL_COMMUNITY): Payer: PPO | Admitting: Anesthesiology

## 2020-02-14 ENCOUNTER — Ambulatory Visit (HOSPITAL_COMMUNITY)
Admission: RE | Admit: 2020-02-14 | Discharge: 2020-02-14 | Disposition: A | Discharge status: Home / Self Care | Payer: PPO | Source: Ambulatory Visit | Attending: General Surgery | Admitting: General Surgery

## 2020-02-14 ENCOUNTER — Encounter (HOSPITAL_COMMUNITY): Payer: Self-pay | Admitting: General Surgery

## 2020-02-14 DIAGNOSIS — Z923 Personal history of irradiation: Secondary | ICD-10-CM | POA: Insufficient documentation

## 2020-02-14 DIAGNOSIS — I10 Essential (primary) hypertension: Secondary | ICD-10-CM | POA: Insufficient documentation

## 2020-02-14 DIAGNOSIS — Z85038 Personal history of other malignant neoplasm of large intestine: Secondary | ICD-10-CM | POA: Insufficient documentation

## 2020-02-14 DIAGNOSIS — Z803 Family history of malignant neoplasm of breast: Secondary | ICD-10-CM | POA: Diagnosis not present

## 2020-02-14 DIAGNOSIS — Z171 Estrogen receptor negative status [ER-]: Secondary | ICD-10-CM | POA: Diagnosis not present

## 2020-02-14 DIAGNOSIS — Z9221 Personal history of antineoplastic chemotherapy: Secondary | ICD-10-CM | POA: Insufficient documentation

## 2020-02-14 DIAGNOSIS — Z79899 Other long term (current) drug therapy: Secondary | ICD-10-CM | POA: Diagnosis not present

## 2020-02-14 DIAGNOSIS — C50912 Malignant neoplasm of unspecified site of left female breast: Principal | ICD-10-CM | POA: Diagnosis present

## 2020-02-14 DIAGNOSIS — R2681 Unsteadiness on feet: Secondary | ICD-10-CM | POA: Insufficient documentation

## 2020-02-14 DIAGNOSIS — C50412 Malignant neoplasm of upper-outer quadrant of left female breast: Secondary | ICD-10-CM | POA: Diagnosis not present

## 2020-02-14 DIAGNOSIS — G8918 Other acute postprocedural pain: Secondary | ICD-10-CM | POA: Diagnosis not present

## 2020-02-14 DIAGNOSIS — Z6841 Body Mass Index (BMI) 40.0 and over, adult: Secondary | ICD-10-CM | POA: Diagnosis not present

## 2020-02-14 HISTORY — PX: MASTECTOMY: SHX3

## 2020-02-14 HISTORY — DX: Essential (primary) hypertension: I10

## 2020-02-14 HISTORY — PX: MASTECTOMY W/ SENTINEL NODE BIOPSY: SHX2001

## 2020-02-14 SURGERY — MASTECTOMY WITH SENTINEL LYMPH NODE BIOPSY
Anesthesia: Regional | Site: Breast | Laterality: Left

## 2020-02-14 MED ORDER — OXYCODONE HCL 5 MG PO TABS
5.0000 mg | ORAL_TABLET | ORAL | Status: DC | PRN
  Administered 2020-02-14 – 2020-02-15 (×3): 10 mg via ORAL
  Filled 2020-02-14 (×3): qty 2

## 2020-02-14 MED ORDER — CHLORHEXIDINE GLUCONATE 0.12 % MT SOLN
15.0000 mL | Freq: Once | OROMUCOSAL | Status: AC
  Administered 2020-02-14: 15 mL via OROMUCOSAL
  Filled 2020-02-14: qty 15

## 2020-02-14 MED ORDER — ONDANSETRON HCL 4 MG/2ML IJ SOLN
INTRAMUSCULAR | Status: DC | PRN
  Administered 2020-02-14: 4 mg via INTRAVENOUS

## 2020-02-14 MED ORDER — ROPIVACAINE HCL 5 MG/ML IJ SOLN
INTRAMUSCULAR | Status: DC | PRN
  Administered 2020-02-14: 30 mL via PERINEURAL

## 2020-02-14 MED ORDER — ACETAMINOPHEN 500 MG PO TABS
1000.0000 mg | ORAL_TABLET | ORAL | Status: AC
  Administered 2020-02-14: 1000 mg via ORAL
  Filled 2020-02-14: qty 2

## 2020-02-14 MED ORDER — FENTANYL CITRATE (PF) 100 MCG/2ML IJ SOLN: 25.0000 ug | INTRAMUSCULAR | Status: DC | PRN

## 2020-02-14 MED ORDER — LACTATED RINGERS IV SOLN: INTRAVENOUS | Status: DC | PRN

## 2020-02-14 MED ORDER — MIDAZOLAM HCL 2 MG/2ML IJ SOLN
INTRAMUSCULAR | Status: AC
  Filled 2020-02-14: qty 2

## 2020-02-14 MED ORDER — PHENYLEPHRINE 40 MCG/ML (10ML) SYRINGE FOR IV PUSH (FOR BLOOD PRESSURE SUPPORT)
PREFILLED_SYRINGE | INTRAVENOUS | Status: DC | PRN
  Administered 2020-02-14 (×3): 80 ug via INTRAVENOUS

## 2020-02-14 MED ORDER — CLONIDINE HCL (ANALGESIA) 100 MCG/ML EP SOLN
EPIDURAL | Status: DC | PRN
  Administered 2020-02-14: 100 ug

## 2020-02-14 MED ORDER — FENTANYL CITRATE (PF) 250 MCG/5ML IJ SOLN
INTRAMUSCULAR | Status: AC
  Filled 2020-02-14: qty 5

## 2020-02-14 MED ORDER — DEXAMETHASONE SODIUM PHOSPHATE 10 MG/ML IJ SOLN
INTRAMUSCULAR | Status: AC
  Filled 2020-02-14: qty 1

## 2020-02-14 MED ORDER — ONDANSETRON HCL 4 MG/2ML IJ SOLN: 4.0000 mg | Freq: Four times a day (QID) | INTRAMUSCULAR | Status: DC | PRN

## 2020-02-14 MED ORDER — SIMETHICONE 80 MG PO CHEW: 40.0000 mg | CHEWABLE_TABLET | Freq: Four times a day (QID) | ORAL | Status: DC | PRN

## 2020-02-14 MED ORDER — PROPOFOL 10 MG/ML IV BOLUS
INTRAVENOUS | Status: DC | PRN
  Administered 2020-02-14: 200 mg via INTRAVENOUS

## 2020-02-14 MED ORDER — URSODIOL 300 MG PO CAPS
300.0000 mg | ORAL_CAPSULE | Freq: Three times a day (TID) | ORAL | Status: DC
  Administered 2020-02-14 – 2020-02-15 (×3): 300 mg via ORAL
  Filled 2020-02-14 (×7): qty 1

## 2020-02-14 MED ORDER — HYDROCHLOROTHIAZIDE 10 MG/ML ORAL SUSPENSION
6.2500 mg | ORAL | Status: DC
  Administered 2020-02-15: 6.25 mg via ORAL
  Filled 2020-02-14: qty 1.25

## 2020-02-14 MED ORDER — ONDANSETRON HCL 4 MG/2ML IJ SOLN
INTRAMUSCULAR | Status: AC
  Filled 2020-02-14: qty 2

## 2020-02-14 MED ORDER — METHYLENE BLUE 0.5 % INJ SOLN
INTRAVENOUS | Status: AC
  Filled 2020-02-14: qty 10

## 2020-02-14 MED ORDER — TECHNETIUM TC 99M SULFUR COLLOID FILTERED
1.0000 | Freq: Once | INTRAVENOUS | Status: AC | PRN
  Administered 2020-02-14: 1 via INTRADERMAL

## 2020-02-14 MED ORDER — LISINOPRIL 10 MG PO TABS
10.0000 mg | ORAL_TABLET | ORAL | Status: DC
  Administered 2020-02-15: 10 mg via ORAL
  Filled 2020-02-14: qty 1

## 2020-02-14 MED ORDER — GABAPENTIN 300 MG PO CAPS
300.0000 mg | ORAL_CAPSULE | Freq: Every day | ORAL | Status: DC
  Administered 2020-02-14: 300 mg via ORAL
  Filled 2020-02-14: qty 1

## 2020-02-14 MED ORDER — ORAL CARE MOUTH RINSE: 15.0000 mL | Freq: Once | OROMUCOSAL | Status: AC

## 2020-02-14 MED ORDER — LISINOPRIL-HYDROCHLOROTHIAZIDE 20-12.5 MG PO TABS: 0.5000 | ORAL_TABLET | ORAL | Status: DC

## 2020-02-14 MED ORDER — ENOXAPARIN SODIUM 40 MG/0.4ML ~~LOC~~ SOLN: 40.0000 mg | SUBCUTANEOUS | Status: DC

## 2020-02-14 MED ORDER — LIDOCAINE 2% (20 MG/ML) 5 ML SYRINGE
INTRAMUSCULAR | Status: DC | PRN
  Administered 2020-02-14: 40 mg via INTRAVENOUS

## 2020-02-14 MED ORDER — KETOROLAC TROMETHAMINE 15 MG/ML IJ SOLN: 15.0000 mg | Freq: Once | INTRAMUSCULAR | Status: DC | PRN

## 2020-02-14 MED ORDER — FENTANYL CITRATE (PF) 100 MCG/2ML IJ SOLN
INTRAMUSCULAR | Status: DC | PRN
  Administered 2020-02-14: 50 ug via INTRAVENOUS
  Administered 2020-02-14 (×5): 25 ug via INTRAVENOUS

## 2020-02-14 MED ORDER — BUPIVACAINE HCL (PF) 0.25 % IJ SOLN
INTRAMUSCULAR | Status: AC
  Filled 2020-02-14: qty 30

## 2020-02-14 MED ORDER — MORPHINE SULFATE (PF) 2 MG/ML IV SOLN: 1.0000 mg | INTRAVENOUS | Status: DC | PRN

## 2020-02-14 MED ORDER — METOPROLOL TARTRATE 5 MG/5ML IV SOLN: 5.0000 mg | Freq: Four times a day (QID) | INTRAVENOUS | Status: DC | PRN

## 2020-02-14 MED ORDER — SODIUM CHLORIDE (PF) 0.9 % IJ SOLN
INTRAMUSCULAR | Status: AC
  Filled 2020-02-14: qty 10

## 2020-02-14 MED ORDER — CEFAZOLIN SODIUM-DEXTROSE 2-4 GM/100ML-% IV SOLN
2.0000 g | INTRAVENOUS | Status: AC
  Administered 2020-02-14: 2 g via INTRAVENOUS
  Filled 2020-02-14: qty 100

## 2020-02-14 MED ORDER — SODIUM CHLORIDE 0.9 % IV SOLN: INTRAVENOUS | Status: DC

## 2020-02-14 MED ORDER — 0.9 % SODIUM CHLORIDE (POUR BTL) OPTIME
TOPICAL | Status: DC | PRN
  Administered 2020-02-14: 1000 mL

## 2020-02-14 MED ORDER — ONDANSETRON 4 MG PO TBDP: 4.0000 mg | ORAL_TABLET | Freq: Four times a day (QID) | ORAL | Status: DC | PRN

## 2020-02-14 MED ORDER — POLYVINYL ALCOHOL 1.4 % OP SOLN
1.0000 [drp] | OPHTHALMIC | Status: DC | PRN
  Filled 2020-02-14 (×2): qty 15

## 2020-02-14 MED ORDER — HYPROMELLOSE (GONIOSCOPIC) 2.5 % OP SOLN
1.0000 [drp] | Freq: Every day | OPHTHALMIC | Status: DC | PRN
  Filled 2020-02-14: qty 15

## 2020-02-14 MED ORDER — SODIUM CHLORIDE (PF) 0.9 % IJ SOLN
INTRAVENOUS | Status: DC | PRN
  Administered 2020-02-14: 5 mL via INTRAMUSCULAR

## 2020-02-14 MED ORDER — PROPOFOL 10 MG/ML IV BOLUS
INTRAVENOUS | Status: AC
  Filled 2020-02-14: qty 20

## 2020-02-14 MED ORDER — CEFAZOLIN SODIUM-DEXTROSE 2-4 GM/100ML-% IV SOLN
2.0000 g | Freq: Three times a day (TID) | INTRAVENOUS | Status: AC
  Administered 2020-02-14 – 2020-02-15 (×3): 2 g via INTRAVENOUS
  Filled 2020-02-14 (×3): qty 100

## 2020-02-14 MED ORDER — ENSURE PRE-SURGERY PO LIQD: 296.0000 mL | Freq: Once | ORAL | Status: DC

## 2020-02-14 MED ORDER — METHOCARBAMOL 500 MG PO TABS: 500.0000 mg | ORAL_TABLET | Freq: Four times a day (QID) | ORAL | Status: DC | PRN

## 2020-02-14 MED ORDER — ACETAMINOPHEN 500 MG PO TABS
1000.0000 mg | ORAL_TABLET | Freq: Four times a day (QID) | ORAL | Status: DC
  Administered 2020-02-14 – 2020-02-15 (×4): 1000 mg via ORAL
  Filled 2020-02-14 (×4): qty 2

## 2020-02-14 MED ORDER — GABAPENTIN 100 MG PO CAPS
100.0000 mg | ORAL_CAPSULE | ORAL | Status: AC
  Administered 2020-02-14: 100 mg via ORAL
  Filled 2020-02-14: qty 1

## 2020-02-14 MED ORDER — LIDOCAINE 2% (20 MG/ML) 5 ML SYRINGE
INTRAMUSCULAR | Status: AC
  Filled 2020-02-14: qty 5

## 2020-02-14 MED ORDER — DEXAMETHASONE SODIUM PHOSPHATE 10 MG/ML IJ SOLN
INTRAMUSCULAR | Status: DC | PRN
  Administered 2020-02-14: 5 mg via INTRAVENOUS

## 2020-02-14 MED ORDER — PHENYLEPHRINE HCL-NACL 10-0.9 MG/250ML-% IV SOLN
INTRAVENOUS | Status: DC | PRN
  Administered 2020-02-14: 40 ug/min via INTRAVENOUS

## 2020-02-14 MED ORDER — ONDANSETRON HCL 4 MG/2ML IJ SOLN: 4.0000 mg | Freq: Once | INTRAMUSCULAR | Status: DC | PRN

## 2020-02-14 MED ORDER — MIDAZOLAM HCL 5 MG/5ML IJ SOLN
INTRAMUSCULAR | Status: DC | PRN
  Administered 2020-02-14: 1 mg via INTRAVENOUS

## 2020-02-14 SURGICAL SUPPLY — 62 items
ADH SKN CLS APL DERMABOND .7 (GAUZE/BANDAGES/DRESSINGS) ×1
APL PRP STRL LF DISP 70% ISPRP (MISCELLANEOUS) ×1
APPLIER CLIP 9.375 MED OPEN (MISCELLANEOUS) ×2
APR CLP MED 9.3 20 MLT OPN (MISCELLANEOUS) ×1
BINDER BREAST XLRG (GAUZE/BANDAGES/DRESSINGS) ×1 IMPLANT
BINDER BREAST XXLRG (GAUZE/BANDAGES/DRESSINGS) ×1 IMPLANT
BIOPATCH RED 1 DISK 7.0 (GAUZE/BANDAGES/DRESSINGS) ×2 IMPLANT
CANISTER SUCT 3000ML PPV (MISCELLANEOUS) ×2 IMPLANT
CHLORAPREP W/TINT 26 (MISCELLANEOUS) ×2 IMPLANT
CLIP APPLIE 9.375 MED OPEN (MISCELLANEOUS) IMPLANT
CNTNR URN SCR LID CUP LEK RST (MISCELLANEOUS) ×1 IMPLANT
CONT SPEC 4OZ STRL OR WHT (MISCELLANEOUS) ×2
COVER PROBE W GEL 5X96 (DRAPES) ×2 IMPLANT
COVER SURGICAL LIGHT HANDLE (MISCELLANEOUS) ×2 IMPLANT
COVER WAND RF STERILE (DRAPES) ×1 IMPLANT
DERMABOND ADVANCED (GAUZE/BANDAGES/DRESSINGS) ×1
DERMABOND ADVANCED .7 DNX12 (GAUZE/BANDAGES/DRESSINGS) IMPLANT
DRAIN CHANNEL 19F RND (DRAIN) ×2 IMPLANT
DRAPE LAPAROSCOPIC ABDOMINAL (DRAPES) ×2 IMPLANT
DRSG PAD ABDOMINAL 8X10 ST (GAUZE/BANDAGES/DRESSINGS) ×1 IMPLANT
DRSG TEGADERM 4X4.75 (GAUZE/BANDAGES/DRESSINGS) ×2 IMPLANT
ELECT BLADE 4.0 EZ CLEAN MEGAD (MISCELLANEOUS)
ELECT CAUTERY BLADE 6.4 (BLADE) ×1 IMPLANT
ELECT REM PT RETURN 9FT ADLT (ELECTROSURGICAL) ×2
ELECTRODE BLDE 4.0 EZ CLN MEGD (MISCELLANEOUS) ×1 IMPLANT
ELECTRODE REM PT RTRN 9FT ADLT (ELECTROSURGICAL) ×1 IMPLANT
EVACUATOR SILICONE 100CC (DRAIN) ×2 IMPLANT
GLOVE BIO SURGEON STRL SZ7 (GLOVE) ×2 IMPLANT
GLOVE BIOGEL PI IND STRL 7.5 (GLOVE) ×1 IMPLANT
GLOVE BIOGEL PI INDICATOR 7.5 (GLOVE) ×1
GOWN STRL REUS W/ TWL LRG LVL3 (GOWN DISPOSABLE) ×3 IMPLANT
GOWN STRL REUS W/TWL LRG LVL3 (GOWN DISPOSABLE) ×6
KIT BASIN OR (CUSTOM PROCEDURE TRAY) ×2 IMPLANT
KIT TURNOVER KIT B (KITS) ×2 IMPLANT
MARKER SKIN DUAL TIP RULER LAB (MISCELLANEOUS) ×2 IMPLANT
NDL 18GX1X1/2 (RX/OR ONLY) (NEEDLE) IMPLANT
NDL FILTER BLUNT 18X1 1/2 (NEEDLE) IMPLANT
NDL HYPO 25GX1X1/2 BEV (NEEDLE) IMPLANT
NEEDLE 18GX1X1/2 (RX/OR ONLY) (NEEDLE) ×2 IMPLANT
NEEDLE FILTER BLUNT 18X 1/2SAF (NEEDLE) ×1
NEEDLE FILTER BLUNT 18X1 1/2 (NEEDLE) ×1 IMPLANT
NEEDLE HYPO 25GX1X1/2 BEV (NEEDLE) IMPLANT
NS IRRIG 1000ML POUR BTL (IV SOLUTION) ×2 IMPLANT
PACK GENERAL/GYN (CUSTOM PROCEDURE TRAY) ×2 IMPLANT
PAD ARMBOARD 7.5X6 YLW CONV (MISCELLANEOUS) ×3 IMPLANT
PENCIL SMOKE EVACUATOR (MISCELLANEOUS) ×2 IMPLANT
PIN SAFETY STERILE (MISCELLANEOUS) ×2 IMPLANT
SPECIMEN JAR X LARGE (MISCELLANEOUS) ×2 IMPLANT
STAPLER VISISTAT 35W (STAPLE) ×1 IMPLANT
STRIP CLOSURE SKIN 1/2X4 (GAUZE/BANDAGES/DRESSINGS) ×2 IMPLANT
SUT ETHILON 2 0 FS 18 (SUTURE) ×3 IMPLANT
SUT MNCRL AB 4-0 PS2 18 (SUTURE) ×1 IMPLANT
SUT MON AB 4-0 PC3 18 (SUTURE) ×1 IMPLANT
SUT SILK 2 0 SH (SUTURE) ×1 IMPLANT
SUT VIC AB 2-0 SH 18 (SUTURE) ×1 IMPLANT
SUT VIC AB 3-0 54X BRD REEL (SUTURE) ×1 IMPLANT
SUT VIC AB 3-0 BRD 54 (SUTURE) ×2
SUT VIC AB 3-0 SH 18 (SUTURE) ×4 IMPLANT
SUT VIC AB 3-0 SH 8-18 (SUTURE) ×1 IMPLANT
SYR CONTROL 10ML LL (SYRINGE) ×1 IMPLANT
TOWEL GREEN STERILE (TOWEL DISPOSABLE) ×2 IMPLANT
TOWEL GREEN STERILE FF (TOWEL DISPOSABLE) ×2 IMPLANT

## 2020-02-14 NOTE — Interval H&P Note (Signed)
History and Physical Interval Note:  02/14/2020 7:11 AM  [name]  has presented today for surgery, with the diagnosis of RECURRENT BREAST CANCER.  The various methods of treatment have been discussed with the patient and family. After consideration of risks, benefits and other options for treatment, the patient has consented to  Procedure(s) with comments: LEFT MASTECTOMY WITH LEFT AXILLARY SENTINEL LYMPH NODE BIOPSY, BLUE DYE INJECTION (Left) - PEC BLOCK as a surgical intervention.  The patient's history has been reviewed, patient examined, no change in status, stable for surgery.  I have reviewed the patient's chart and labs.  Questions were answered to the patient's satisfaction.     Rolm Bookbinder

## 2020-02-14 NOTE — H&P (Signed)
[name] is an 69 y.o. female.   Chief Complaint: breast cancer HPI:  51 yof who had primary chemotherapy for stage III tn left breast cancer in 2015 followed by left lump/sn and radiotherapy. she had complete path response in breast and all nodes negative. she also had a right sided papilloma biopsied. she has mm 3/21 that is negative. no mass or dc. 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 she has no lue issues. she had mri for high risk due to mutation and had a negative right breast, no nodes abnl, indeterminate left breast mass- 2.2x1.9x1.9 cm. 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 she is here today to discuss options   Past Medical History:  Diagnosis Date  . Colon cancer ([place])   . Hx of rotator cuff surgery   . left breast cancer   . Personal history of chemotherapy 2016   left  . Personal history of radiation therapy 2016   left breast   . Radiation 11/15/14-01/02/15   left breast, axillary and supraclavicular region 45 gray, lumpectomy cavity boosted to 16 gray    Past Surgical History:  Procedure Laterality Date  . ABDOMINAL HYSTERECTOMY  1988  . BREAST EXCISIONAL BIOPSY Right 12/21/2017  . BREAST LUMPECTOMY Left 2016  . COLON SURGERY  1988   colectomy/colostomy-ca  . COLON SURGERY  1988   colostomy takedown-reversal  . COLONOSCOPY    . EXCISION / BIOPSY BREAST / NIPPLE / DUCT Left 09/20/2014  . RADIOACTIVE SEED GUIDED EXCISIONAL BREAST BIOPSY Right 12/21/2017   Procedure: RIGHT RADIOACTIVE SEED GUIDED EXCISIONAL BREAST BIOPSY ERAS PATHWAY;  Surgeon: Rolm Bookbinder, MD;  Location: [facility];  Service: General;  Laterality: Right;  . RADIOACTIVE SEED GUIDED PARTIAL MASTECTOMY WITH AXILLARY SENTINEL LYMPH NODE BIOPSY Left 09/20/2014   Procedure: RADIOACTIVE SEED GUIDED LEFT BREAST LUMPECTOMY WITH AXILLARY SENTINEL LYMPH NODE BIOPSY;  Surgeon: Rolm Bookbinder,  MD;  Location: [facility];  Service: General;  Laterality: Left;  . TOTAL SHOULDER ARTHROPLASTY  2009   right    Family History  Problem Relation Age of Onset  . Cancer Mother 69       breast  . Breast cancer Mother        early 47s  . Stroke Father 38       cause of death  . Diabetes Father   . Cancer Maternal Aunt        breast cancer at unknown age  . Breast cancer Maternal Aunt   . Ovarian cancer Neg Hx    Social History:  reports that she has never smoked. She has never used smokeless tobacco. She reports previous alcohol use. She reports that she does not use drugs.  Allergies: No Known Allergies  Medications Prior to Admission  Medication Sig Dispense Refill  . acetaminophen (TYLENOL) 500 MG tablet Take 1,000 mg by mouth every 6 (six) hours as needed for mild pain or moderate pain.    Marland Kitchen gabapentin (NEURONTIN) 300 MG capsule Take 1 capsule (300 mg total) by mouth at bedtime. 90 capsule 4  . hydroxypropyl methylcellulose / hypromellose (ISOPTO TEARS / GONIOVISC) 2.5 % ophthalmic solution Place 1 drop into both eyes daily as needed for dry eyes.    . Liniments (SALONPAS ARTHRITIS PAIN RELIEF EX) Apply 1 application topically daily as needed (knee pain).    Marland Kitchen lisinopril-hydrochlorothiazide (  ZESTORETIC) 20-12.5 MG tablet Take 1/2 tablet daily by mouth (Patient taking differently: Take 0.5 tablets by mouth 3 (three) times a week. ) 30 tablet 6  . Multiple Vitamins-Minerals (AIRBORNE PO) Take 1 tablet by mouth 2 (two) times a week.    . ursodiol (ACTIGALL) 500 MG tablet Take 500 mg by mouth 3 (three) times daily.    Marland Kitchen anastrozole (ARIMIDEX) 1 MG tablet Take 1 tablet (1 mg total) by mouth daily. (Patient not taking: Reported on 02/06/2020) 90 tablet 4  . palbociclib (IBRANCE) 125 MG tablet Take 1 tablet (125 mg total) by mouth daily. Take for 21 days on, 7 days off, repeat every 28 days. 21 tablet 6  . potassium chloride (K-DUR) 10 MEQ tablet Take 1 tablet (10 mEq  total) by mouth 2 (two) times daily. (Patient not taking: Reported on 12/07/2019) 60 tablet 4    No results found for this or any previous visit (from the past 48 hour(s)). No results found.  Review of Systems  General Present- Fatigue. Not Present- Appetite Loss, Chills, Fever, Night Sweats, Weight Gain and Weight Loss. Skin Not Present- Change in Wart/Mole, Dryness, Hives, Jaundice, New Lesions, Non-Healing Wounds, Rash and Ulcer. HEENT Not Present- Earache, Hearing Loss, Hoarseness, Nose Bleed, Oral Ulcers, Ringing in the Ears, Seasonal Allergies, Sinus Pain, Sore Throat, Visual Disturbances, Wears glasses/contact lenses and Yellow Eyes. Respiratory Not Present- Bloody sputum, Chronic Cough, Difficulty Breathing, Snoring and Wheezing. Breast Not Present- Breast Mass, Breast Pain, Nipple Discharge and Skin Changes. Cardiovascular Present- Shortness of Breath. Not Present- Chest Pain, Difficulty Breathing Lying Down, Leg Cramps, Palpitations, Rapid Heart Rate and Swelling of Extremities. Gastrointestinal Not Present- Abdominal Pain, Bloating, Bloody Stool, Change in Bowel Habits, Chronic diarrhea, Constipation, Difficulty Swallowing, Excessive gas, Gets full quickly at meals, Hemorrhoids, Indigestion, Nausea, Rectal Pain and Vomiting. Female Genitourinary Not Present- Frequency, Nocturia, Painful Urination, Pelvic Pain and Urgency. Musculoskeletal Present- Joint Stiffness. Not Present- Back Pain, Joint Pain, Muscle Pain, Muscle Weakness and Swelling of Extremities. Neurological Present- Trouble walking. Not Present- Decreased Memory, Fainting, Headaches, Numbness, Seizures, Tingling, Tremor and Weakness. Psychiatric Not Present- Anxiety, Bipolar, Change in Sleep Pattern, Depression, Fearful and Frequent crying. Endocrine Not Present- Cold Intolerance, Excessive Hunger, Hair Changes, Heat Intolerance, Hot flashes and New Diabetes. Hematology Not Present- Blood Thinners, Easy Bruising, Excessive  bleeding, Gland problems, HIV and Persistent Infections.  Blood pressure (!) 165/83, pulse 85, temperature 98.5 F (36.9 C), temperature source Oral, resp. rate 18, height 5' 3" (1.6 m), weight 104.8 kg, SpO2 100 %. Physical Exam  General Mental Status-Alert. Orientation-Oriented X3. Breast Nipples-No Discharge. No mass cv rr Lungs clear   Assessment/Plan RECURRENT BREAST CANCER, LEFT (C50.912) Story: Left mastectomy, left ax sn biopsy We discussed the staging and pathophysiology of breast cancer. We discussed all of the different options for treatment for breast cancer including surgery, chemotherapy, radiation therapy, Herceptin, and antiestrogen therapy. We discussed a sentinel lymph node biopsy as she does not appear to having lymph node involvement right now. We discussed the performance of that with injection of radioactive tracer. We discussed that there is a chance of having a positive node with a sentinel lymph node biopsy and we will await the permanent pathology to make any other first further decisions in terms of her treatment. We discussed up to a 5% risk lifetime of chronic shoulder pain as well as lymphedema associated with a sentinel lymph node biopsy. I told her 80% or so chance of repeat sn biopsy, if unable will  not do anything more with nodes. will use dual tracer. I think that she just needs mastectomy now with recurrence after bct and her mutation. she does not desire reconstruction. I dont think at her age needs bilateral and she wishes to avoid that as well. discussed surgery risks and recovery. I will plan to schedule. will discuss with oncology if she will need a port.   Rolm Bookbinder, MD 02/14/2020, 7:09 AM

## 2020-02-14 NOTE — Anesthesia Procedure Notes (Signed)
Procedure Name: LMA Insertion Date/Time: 02/14/2020 7:33 AM Performed by: Imagene Riches, CRNA Pre-anesthesia Checklist: Patient identified, Emergency Drugs available, Suction available and Patient being monitored Patient Re-evaluated:Patient Re-evaluated prior to induction Oxygen Delivery Method: Circle System Utilized Preoxygenation: Pre-oxygenation with 100% oxygen Induction Type: IV induction Ventilation: Mask ventilation without difficulty LMA: LMA inserted LMA Size: 4.0 Number of attempts: 1 Airway Equipment and Method: Bite block Placement Confirmation: positive ETCO2 Tube secured with: Tape Dental Injury: Teeth and Oropharynx as per pre-operative assessment

## 2020-02-14 NOTE — Anesthesia Procedure Notes (Signed)
Anesthesia Regional Block: Pectoralis block   Pre-Anesthetic Checklist: ,, timeout performed, Correct Patient, Correct Site, Correct Laterality, Correct Procedure,, site marked, risks and benefits discussed, Surgical consent,  Pre-op evaluation,  At surgeon's request and post-op pain management  Laterality: Left  Prep: chloraprep       Needles:  Injection technique: Single-shot  Needle Type: Echogenic Stimulator Needle     Needle Length: 10cm  Needle Gauge: 20     Additional Needles:   Procedures:,,,, ultrasound used (permanent image in chart),,,,  Narrative:  Start time: 02/14/2020 6:50 AM End time: 02/14/2020 7:00 AM Injection made incrementally with aspirations every 5 mL.  Performed by: Personally  Anesthesiologist: Murvin Natal, MD  Additional Notes: Functioning IV was confirmed and monitors were applied.  A 171mm 20ga BBraun echogenic stimulator needle was used. Sterile prep and drape,hand hygiene and sterile gloves were used.  Negative aspiration and negative test dose prior to incremental administration of local anesthetic. The patient tolerated the procedure well.

## 2020-02-14 NOTE — Discharge Instructions (Signed)
[facility] [place] °[phone] ° °MASTECTOMY: POST OP INSTRUCTIONS °Take 400 mg of ibuprofen every 8 hours or 650 mg tylenol every 6 hours for next 72 hours then as needed. Use ice several times daily also. °Always review your discharge instruction sheet given to you by the facility where your surgery was performed. ° °IF YOU HAVE DISABILITY OR FAMILY LEAVE FORMS, YOU MUST BRING THEM TO THE OFFICE FOR PROCESSING.   °DO NOT GIVE THEM TO YOUR DOCTOR. °A prescription for pain medication may be given to you upon discharge.  Take your pain medication as prescribed, if needed.  If narcotic pain medicine is not needed, then you may take acetaminophen (Tylenol), naprosyn (Alleve) or ibuprofen (Advil) as needed. °1. Take your usually prescribed medications unless otherwise directed. °2. If you need a refill on your pain medication, please contact your pharmacy.  They will contact our office to request authorization.  Prescriptions will not be filled after 5pm or on week-ends. °3. You should follow a light diet the first few days after arrival home, such as soup and crackers, etc.  Resume your normal diet the day after surgery. °4. Most patients will experience some swelling and bruising on the chest and underarm.  Ice packs will help.  Swelling and bruising can take several days to resolve. Wear the binder day and night until you return to the office.  °5. It is common to experience some constipation if taking pain medication after surgery.  Increasing fluid intake and taking a stool softener (such as Colace) will usually help or prevent this problem from occurring.  A mild laxative (Milk of Magnesia or Miralax) should be taken according to package instructions if there are no bowel movements after 48 hours. °6. Unless discharge instructions indicate otherwise, leave your bandage dry and in place until your next appointment in 3-5 days.  You may take a limited sponge bath.  No tube baths or showers until the  drains are removed.  You may have steri-strips (small skin tapes) in place directly over the incision.  These strips should be left on the skin for 7-10 days. If you have glue it will come off in next couple week.  Any sutures will be removed at an office visit °7. DRAINS:  If you have drains in place, it is important to keep a list of the amount of drainage produced each day in your drains.  Before leaving the hospital, you should be instructed on drain care.  Call our office if you have any questions about your drains. I will remove your drains when they put out less than 30 cc or ml for 2 consecutive days. °8. ACTIVITIES:  You may resume regular (light) daily activities beginning the next day--such as daily self-care, walking, climbing stairs--gradually increasing activities as tolerated.  You may have sexual intercourse when it is comfortable.  Refrain from any heavy lifting or straining until approved by your doctor. °a. You may drive when you are no longer taking prescription pain medication, you can comfortably wear a seatbelt, and you can safely maneuver your car and apply brakes. °b. RETURN TO WORK:  __________________________________________________________ °9. You should see your doctor in the office for a follow-up appointment approximately 3-5 days after your surgery.  Your doctor’s nurse will typically make your follow-up appointment when she calls you with your pathology report.  Expect your pathology report 3-4business days after surgery. °10. OTHER INSTRUCTIONS: ______________________________________________________________________________________________ ____________________________________________________________________________________________ °WHEN TO CALL YOUR DR Trista Ciocca: °1. Fever over 101.0 °  2. Nausea and/or vomiting °3. Extreme swelling or bruising °4. Continued bleeding from incision. °5. Increased pain, redness, or drainage from the incision. °The clinic staff is available to answer your  questions during regular business hours.  Please don’t hesitate to call and ask to speak to one of the nurses for clinical concerns.  If you have a medical emergency, go to the nearest emergency room or call 911.  A surgeon from [facility] is always on call at the hospital. °[address], [address], [place], [place]  [address] ? [address] [address], [place], [place]   [address] °[phone] ? [phone] ? FAX [phone] °Web site: [email or web address] ° °

## 2020-02-14 NOTE — Plan of Care (Signed)

## 2020-02-14 NOTE — Plan of Care (Signed)
  Problem: Education: Goal: Knowledge of General Education information will improve Description Including pain rating scale, medication(s)/side effects and non-pharmacologic comfort measures Outcome: Progressing   

## 2020-02-14 NOTE — Op Note (Signed)
Preoperative diagnosis: Recurrent left breast cancer Postoperative diagnosis: Same as above Procedure: 1.  Left total mastectomy 2.  Injection of blue dye for sentinel node identification 3.  Attempted left axillary sentinel lymph node biopsy Surgeon: Dr. Serita Grammes Anesthesia: General with pectoral block Estimated blood loss: 20 cc Specimens: Left breast tissue marked short superior, long lateral Complications: None Drains: 2 19 French Blake drains Sponge needle count was correct at completion Disposition to recovery in stable condition  Indications:69 yof who had primary chemotherapy for stage III tn left breast cancer in 2015 followed by left lump/sn and radiotherapy. she had complete path response in breast and all nodes negative. she also had a right sided papilloma biopsied. she has mm 3/21 that is negative. no mass or dc. 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 she has no lue issues. she had mri for high risk due to mutation and had a negative right breast, no nodes abnl, indeterminate left breast mass- 2.2x1.9x1.9 cm. 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  We discussed all the options and elected to proceed with mastectomy and attempt another sentinel lymph node biopsy.  She is not going to do chemotherapy per oncology  Procedure: After informed consent was obtained the patient was first injected with technetium in the standard periareolar fashion.  She was given a pectoral block.  She was given antibiotics.  SCDs were in place.  She was placed under general anesthesia without complication.  She was prepped and draped in the standard sterile surgical fashion.  Surgical timeout was then performed.  I first injected a mixture of methylene blue dye and saline around the nac and massaged it.  I made a large elliptical incision to encompass her old incision as well as the nipple areolar complex.   I then created flaps to the clavicle, parasternal region, inframammary fold, and latissimus laterally.  With some difficulty due to her prior radiation the breast and pectoralis fascia were then removed from the muscle.  There were no sentinel lymph nodes identified either with the neoprobe or with the blue dye.  Her axilla was very scarred in due to prior radiation as well as the sentinel node procedure.  Due to the fact that her lymph nodes were negative clinically and radiologically preoperatively I elected not to do anything more to her axilla.  This is as we discussed preoperatively.  I then excised some extra skin.  She had a lot of excess tissue laterally due to her body habitus.  I tried to sew this up to her chest wall with 2-0 Vicryl.  I then placed [place] drains.  I then closed the incision with 3-0 Vicryl and 4-0 Monocryl.  Steri-Strips and dressings were placed.  A binder was placed.  She tolerated this well was extubated and transferred to the recovery room in stable condition.

## 2020-02-14 NOTE — Transfer of Care (Signed)
Immediate Anesthesia Transfer of Care Note  Patient: [name]  Procedure(s) Performed: LEFT MASTECTOMY WITH BLUE DYE INJECTION (Left Breast)  Patient Location: PACU  Anesthesia Type:General  Level of Consciousness: drowsy  Airway & Oxygen Therapy: Patient Spontanous Breathing and Patient connected to nasal cannula oxygen  Post-op Assessment: Report given to RN and Post -op Vital signs reviewed and stable  Post vital signs: Reviewed and stable  Last Vitals:  Vitals Value Taken Time  BP 167/100 02/14/20 0930  Temp 36.4 C 02/14/20 0930  Pulse 72 02/14/20 0937  Resp 9 02/14/20 0937  SpO2 99 % 02/14/20 0937  Vitals shown include unvalidated device data.  Last Pain:  Vitals:   02/14/20 0930  TempSrc:   PainSc: 0-No pain         Complications: No apparent anesthesia complications

## 2020-02-14 NOTE — Anesthesia Postprocedure Evaluation (Signed)
Anesthesia Post Note  Patient: Elyn Eades  Procedure(s) Performed: LEFT MASTECTOMY WITH BLUE DYE INJECTION (Left Breast)     Patient location during evaluation: PACU Anesthesia Type: Regional and General Level of consciousness: awake and alert, oriented and patient cooperative Pain management: pain level controlled Vital Signs Assessment: post-procedure vital signs reviewed and stable Respiratory status: spontaneous breathing, nonlabored ventilation and respiratory function stable Cardiovascular status: blood pressure returned to baseline and stable Postop Assessment: no apparent nausea or vomiting Anesthetic complications: no Comments: Hypertensive, but missing AM BP meds. Will administer missed dose now    Last Vitals:  Vitals:   02/14/20 0930 02/14/20 0945  BP: (!) 167/100 117/73  Pulse: 75 72  Resp: 12 14  Temp: (!) 36.4 C   SpO2:      Last Pain:  Vitals:   02/14/20 0945  TempSrc:   PainSc: 0-No pain                 Pervis Hocking

## 2020-02-15 ENCOUNTER — Other Ambulatory Visit: Payer: Self-pay | Admitting: Oncology

## 2020-02-15 ENCOUNTER — Other Ambulatory Visit: Payer: PPO

## 2020-02-15 ENCOUNTER — Ambulatory Visit: Payer: PPO | Admitting: Adult Health

## 2020-02-15 ENCOUNTER — Ambulatory Visit: Payer: PPO

## 2020-02-15 DIAGNOSIS — C50912 Malignant neoplasm of unspecified site of left female breast: Secondary | ICD-10-CM | POA: Diagnosis not present

## 2020-02-15 DIAGNOSIS — C50919 Malignant neoplasm of unspecified site of unspecified female breast: Secondary | ICD-10-CM | POA: Diagnosis not present

## 2020-02-15 MED ORDER — OXYCODONE HCL 5 MG PO TABS: 5.0000 mg | ORAL_TABLET | ORAL | 0 refills | Status: DC | PRN

## 2020-02-15 NOTE — TOC Initial Note (Signed)
Transition of Care [facility]) - Initial/Assessment Note    Patient Details  Name: [name] MRN: [id number] Date of Birth: [birth year]  Transition [facility] CM/SW Contact:    Marilu Favre, RN Phone Number: 02/15/2020, 10:57 AM  Clinical Narrative:                  Received message from PT , patient needs walker and 3 in 1( same ordered) , also requesting home health RN.   Spoke with patient at bedside. Explained HHRN does not make daily visits and her and family would need to learn JP care. Patient stated she felt comfortable with drain care, and her daughter recorded nurse teaching drain to show her other daughter.  Expected Discharge Plan: Home/Self Care Barriers to Discharge: No Barriers Identified   Patient Goals and CMS Choice Patient states their goals for this hospitalization and ongoing recovery are:: to return to home CMS Medicare.gov Compare Post Acute Care list provided to:: Patient Choice offered to / list presented to : Patient  Expected Discharge Plan and Services Expected Discharge Plan: Home/Self Care   Discharge Planning Services: CM Consult Post Acute Care Choice: Durable Medical Equipment Living arrangements for the past 2 months: Single Family Home Expected Discharge Date: 02/15/20               DME Arranged: Berta Minor rolling DME Agency: [employer] Date DME Agency Contacted: 02/15/20 Time DME Agency Contacted: [phone] Representative spoke with at DME Agency: Zack            Prior Living Arrangements/Services Living arrangements for the past 2 months: [place] with:: Adult Children Patient language and need for interpreter reviewed:: Yes Do you feel safe going back to the place where you live?: Yes      Need for Family Participation in Patient Care: Yes (Comment) Care giver support system in place?: Yes (comment)   Criminal Activity/Legal Involvement Pertinent to Current Situation/Hospitalization: No - Comment as  needed  Activities of Daily Living Home Assistive Devices/Equipment: Eyeglasses ADL Screening (condition at time of admission) Patient's cognitive ability adequate to safely complete daily activities?: Yes Is the patient deaf or have difficulty hearing?: No Does the patient have difficulty seeing, even when wearing glasses/contacts?: No Does the patient have difficulty concentrating, remembering, or making decisions?: No Patient able to express need for assistance with ADLs?: Yes Does the patient have difficulty dressing or bathing?: No Independently performs ADLs?: Yes (appropriate for developmental age) Does the patient have difficulty walking or climbing stairs?: No Weakness of Legs: None Weakness of Arms/Hands: None  Permission Sought/Granted   Permission granted to share information with : No              Emotional Assessment Appearance:: Appears stated age Attitude/Demeanor/Rapport: Engaged Affect (typically observed): Accepting Orientation: : Oriented to Self, Oriented to Place, Oriented to  Time, Oriented to Situation Alcohol / Substance Use: Not Applicable Psych Involvement: No (comment)  Admission diagnosis:  Recurrent cancer of left breast [facility]) [C50.912] Patient Active Problem List   Diagnosis Date Noted   Recurrent cancer of left breast ([place]) 02/14/2020   Recurrent breast adenocarcinoma ([place]) 01/30/2020   Essential hypertension 12/07/2019   Dyspnea on exertion 11/11/2018   Right leg pain 11/11/2018   Obesity, morbid, BMI 40.0-49.9 ([place]) 10/11/2015   PALB2-related breast cancer ([place]) 10/11/2015   Genetic testing 05/24/2015   Elevated LFTs 03/08/2015   Anemia in neoplastic disease 06/12/2014   Hypokalemia 05/29/2014   Watery  eyes 05/09/2014   Malignant neoplasm of colon ([place]) 04/03/2014   Family history of malignant neoplasm of breast 04/03/2014   Malignant neoplasm of upper-outer quadrant of left breast in female, estrogen receptor negative  ([place]) 03/01/2014   PCP:  Glendale Chard, MD Pharmacy:  No Pharmacies Listed    Social Determinants of Health (SDOH) Interventions    Readmission Risk Interventions No flowsheet data found.

## 2020-02-15 NOTE — Discharge Summary (Signed)
Physician Discharge Summary  [name] MRN: [id number] DOB/AGE: Jan 23, 1951 69 y.o.  Admit date: 02/14/2020 Discharge date: 02/15/2020  Admission Diagnoses: Recurrent left breast cancer palb2 mutation htn  Discharge Diagnoses:  Active Problems:   Recurrent cancer of left breast [facility])   Discharged Condition: good  Hospital Course: 15 yof s/p left mastectomy for recurrent cancer. Doing well following am and to be discharged home  Consults: None  Significant Diagnostic Studies: none  Treatments: surgery: left mastectomy  Discharge Exam: Blood pressure 135/75, pulse 80, temperature 98.7 F (37.1 C), resp. rate 15, height 5' 3"  (1.6 m), weight 104.8 kg, SpO2 100 %. incision clean no hematoma drains as expected  Disposition: Discharge disposition: 01-Home or Self Care        Allergies as of 02/15/2020   No Known Allergies     Medication List    TAKE these medications   acetaminophen 500 MG tablet Commonly known as: TYLENOL Take 1,000 mg by mouth every 6 (six) hours as needed for mild pain or moderate pain.   AIRBORNE PO Take 1 tablet by mouth 2 (two) times a week.   anastrozole 1 MG tablet Commonly known as: ARIMIDEX Take 1 tablet (1 mg total) by mouth daily.   gabapentin 300 MG capsule Commonly known as: NEURONTIN Take 1 capsule (300 mg total) by mouth at bedtime.   hydroxypropyl methylcellulose / hypromellose 2.5 % ophthalmic solution Commonly known as: ISOPTO TEARS / GONIOVISC Place 1 drop into both eyes daily as needed for dry eyes.   lisinopril-hydrochlorothiazide 20-12.5 MG tablet Commonly known as: Zestoretic Take 1/2 tablet daily by mouth What changed:   how much to take  how to take this  when to take this  additional instructions   oxyCODONE 5 MG immediate release tablet Commonly known as: Oxy IR/ROXICODONE Take 1 tablet (5 mg total) by mouth every 4 (four) hours as needed for moderate pain.   palbociclib 125 MG  tablet Commonly known as: Ibrance Take 1 tablet (125 mg total) by mouth daily. Take for 21 days on, 7 days off, repeat every 28 days.   potassium chloride 10 MEQ tablet Commonly known as: KLOR-CON Take 1 tablet (10 mEq total) by mouth 2 (two) times daily.   SALONPAS ARTHRITIS PAIN RELIEF EX Apply 1 application topically daily as needed (knee pain).   ursodiol 500 MG tablet Commonly known as: ACTIGALL Take 500 mg by mouth 3 (three) times daily.      Follow-up Information    Rolm Bookbinder, MD Follow up in 1 week(s).   Specialty: General Surgery Contact information: [place] [address] [phone]           Signed: Rolm Bookbinder 02/15/2020, 8:15 AM

## 2020-02-15 NOTE — Evaluation (Signed)
Physical Therapy Evaluation Patient Details Name: [name] MRN: [id number] DOB: 06/05/1951 Today's Date: 02/15/2020   History of Present Illness  Pt is a 69 y.o. F with significant PMH of left breast cancer, right total shoulder arthoplasty. MRI negative right breast and indeterminate left breast mass. Biopsy was done that shows a grade II IDC. Now s/p left total mastectomy 02/14/2020.  Clinical Impression  Prior to admission, pt ambulatory with a cane and independent with ADL's. On PT evaluation, presents with decreased functional mobility secondary to decreased left shoulder ROM, pain, weakness, and balance impairments. Ambulating 90 feet with a walker and supervision. Pt reports improved gait speed and stability with use of walker vs cane. Instructed pt on initiation of postural re-education and gentle LUE ROM exercises. Provided written handout.     Follow Up Recommendations Outpatient PT (when appropriate per surgeon)    Equipment Recommendations  Rolling walker with 5" wheels;3in1 (PT)    Recommendations for Other Services       Precautions / Restrictions Precautions Precautions: Fall;Other (comment) Precaution Comments: x2 JP drains Restrictions Weight Bearing Restrictions: No      Mobility  Bed Mobility               General bed mobility comments: OOB in chair  Transfers Overall transfer level: Needs assistance Equipment used: Rolling walker (2 wheeled) Transfers: Sit to/from Stand Sit to Stand: Supervision            Ambulation/Gait Ambulation/Gait assistance: Supervision Gait Distance (Feet): 90 Feet Assistive device: Rolling walker (2 wheeled) Gait Pattern/deviations: Step-through pattern     General Gait Details: Steady pace, cues for walker proximity  Stairs            Wheelchair Mobility    Modified Rankin (Stroke Patients Only)       Balance Overall balance assessment: Mild deficits observed, not formally tested                                            Pertinent Vitals/Pain Pain Assessment: Faces Faces Pain Scale: Hurts a little bit Pain Location: surgical site, left arm Pain Descriptors / Indicators: Burning Pain Intervention(s): Monitored during session    Home Living Family/patient expects to be discharged to:: Private residence Living Arrangements: Children Available Help at Discharge: Family;Available PRN/intermittently Type of Home: House Home Access: Stairs to enter   [employer] of Steps: 3 Home Layout: One level Home Equipment: Cane - single point      Prior Function Level of Independence: Independent with assistive device(s)         Comments: Uses cane     Hand Dominance        Extremity/Trunk Assessment   Upper Extremity Assessment Upper Extremity Assessment: LUE deficits/detail LUE Deficits / Details: s/p left total mastectomy. Shoulder flexion AROM to 90 degrees, limited by pain. [facility] WFL    Lower Extremity Assessment Lower Extremity Assessment: Overall WFL for tasks assessed       Communication   Communication: No difficulties  Cognition Arousal/Alertness: Awake/alert Behavior During Therapy: WFL for tasks assessed/performed Overall Cognitive Status: Within Functional Limits for tasks assessed                                        General Comments  Exercises Other Exercises Other Exercises: Seated: left elbow flexion x 10, scapular squeezes x 5, left shoulder flexion to 90 x 10   Assessment/Plan    PT Assessment Patient needs continued PT services  PT Problem List Decreased strength;Decreased range of motion;Decreased balance;Decreased activity tolerance;Decreased mobility;Pain       PT Treatment Interventions DME instruction;Gait training;Functional mobility training;Stair training;Therapeutic activities;Therapeutic exercise;Balance training;Patient/family education    PT Goals (Current goals can  be found in the Care Plan section)  Acute Rehab PT Goals Patient Stated Goal: be independent PT Goal Formulation: With patient Time For Goal Achievement: 02/29/20 Potential to Achieve Goals: Good    Frequency Min 3X/week   Barriers to discharge        Co-evaluation               AM-PAC PT "6 Clicks" Mobility  Outcome Measure Help needed turning from your back to your side while in a flat bed without using bedrails?: None Help needed moving from lying on your back to sitting on the side of a flat bed without using bedrails?: None Help needed moving to and from a bed to a chair (including a wheelchair)?: None Help needed standing up from a chair using your arms (e.g., wheelchair or bedside chair)?: None Help needed to walk in hospital room?: None Help needed climbing 3-5 steps with a railing? : A Little 6 Click Score: 23    End of Session   Activity Tolerance: Patient tolerated treatment well Patient left: in chair;with call bell/phone within reach;with family/visitor present Nurse Communication: Mobility status PT Visit Diagnosis: Pain;Unsteadiness on feet (R26.81) Pain - Right/Left: Left Pain - part of body: Arm    Time: [id number] PT Time Calculation (min) (ACUTE ONLY): 27 min   Charges:   PT Evaluation $PT Eval Moderate Complexity: 1 Mod PT Treatments $Gait Training: 8-22 mins          Wyona Almas, PT, DPT Acute Rehabilitation Services Pager [phone] Office [phone]   Deno Etienne 02/15/2020, 1:12 PM

## 2020-02-16 ENCOUNTER — Telehealth: Payer: Self-pay

## 2020-02-16 LAB — SURGICAL PATHOLOGY

## 2020-02-16 NOTE — Telephone Encounter (Signed)
Oral Oncology Patient Advocate Encounter  Met patient in [facility] Lobby to complete application for [employer] in an effort to reduce patient's out of pocket expense for Ibrance to $0.    Application completed and faxed to [phone].   [employer] patient assistance phone number for follow up is [phone].   This encounter will be updated until final determination.     [facility] Patient Advocate [facility] Phone [phone] Fax [phone] 02/16/2020 8:39 AM  

## 2020-02-16 NOTE — Telephone Encounter (Signed)
Patient is approved for Ibrance at no cost through [employer] 02/15/20-09/14/20  [employer] number for follow up [place] Patient [place] Phone [phone] Fax [phone] 02/16/2020 8:40 AM

## 2020-02-22 ENCOUNTER — Telehealth: Payer: Self-pay

## 2020-02-22 NOTE — Telephone Encounter (Signed)
Transition Care Management Follow-up Telephone Call  Date of discharge and from where: 02/15/2020  How have you been since you were released from the hospital? Not to bad  Any questions or concerns? No  Items Reviewed:  Did the pt receive and understand the discharge instructions provided? Yes was explained everything  Medications obtained and verified? Yes oxcodone  Any new allergies since your discharge? No  Dietary orders reviewed? Yes   Do you have support at home? Yes  Other (ie: DME, Home Health, etc) No  Functional Questionnaire: (I = Independent and D = Dependent) ADL's: D daughter assists  Bathing/Dressing- D daughter assist w/bathing and dressing   Meal Prep- D daughter has been doing it  Eating- I  Maintaining continence- I  Transferring/Ambulation- I  uses a Technical brewer Meds- I   Follow up appointments reviewed:    PCP Hospital f/u appt confirmed? Yes  Specialist Hospital f/u appt confirmed?No  Are transportation arrangements needed?No  If their condition worsens, is the pt aware to call  their PCP or go to the ED? Yes  Was the patient provided with contact information for the PCP's office or ED?Yes  Was the pt encouraged to call back with questions or concerns? Yes

## 2020-02-22 NOTE — Telephone Encounter (Signed)
Pt consented to [facility] visit

## 2020-02-24 ENCOUNTER — Inpatient Hospital Stay: Payer: PPO

## 2020-02-24 ENCOUNTER — Telehealth: Payer: Self-pay | Admitting: Adult Health

## 2020-02-24 ENCOUNTER — Other Ambulatory Visit: Payer: Self-pay

## 2020-02-24 ENCOUNTER — Encounter: Payer: Self-pay | Admitting: Adult Health

## 2020-02-24 ENCOUNTER — Inpatient Hospital Stay: Payer: PPO | Admitting: Adult Health

## 2020-02-24 ENCOUNTER — Inpatient Hospital Stay: Payer: PPO | Attending: Oncology

## 2020-02-24 VITALS — BP 128/78 | HR 100 | Temp 98.2°F | Resp 18 | Ht 63.0 in | Wt 226.8 lb

## 2020-02-24 DIAGNOSIS — Z1502 Genetic susceptibility to malignant neoplasm of ovary: Secondary | ICD-10-CM

## 2020-02-24 DIAGNOSIS — Z5111 Encounter for antineoplastic chemotherapy: Secondary | ICD-10-CM | POA: Diagnosis present

## 2020-02-24 DIAGNOSIS — C50912 Malignant neoplasm of unspecified site of left female breast: Secondary | ICD-10-CM

## 2020-02-24 DIAGNOSIS — Z1509 Genetic susceptibility to other malignant neoplasm: Secondary | ICD-10-CM | POA: Diagnosis not present

## 2020-02-24 DIAGNOSIS — Z5112 Encounter for antineoplastic immunotherapy: Secondary | ICD-10-CM | POA: Diagnosis not present

## 2020-02-24 DIAGNOSIS — C189 Malignant neoplasm of colon, unspecified: Secondary | ICD-10-CM

## 2020-02-24 DIAGNOSIS — C50919 Malignant neoplasm of unspecified site of unspecified female breast: Secondary | ICD-10-CM

## 2020-02-24 DIAGNOSIS — C50412 Malignant neoplasm of upper-outer quadrant of left female breast: Secondary | ICD-10-CM

## 2020-02-24 DIAGNOSIS — C50812 Malignant neoplasm of overlapping sites of left female breast: Secondary | ICD-10-CM | POA: Insufficient documentation

## 2020-02-24 DIAGNOSIS — Z171 Estrogen receptor negative status [ER-]: Secondary | ICD-10-CM

## 2020-02-24 DIAGNOSIS — Z1589 Genetic susceptibility to other disease: Secondary | ICD-10-CM | POA: Diagnosis not present

## 2020-02-24 LAB — CBC WITH DIFFERENTIAL/PLATELET
Abs Immature Granulocytes: 0.01 10*3/uL (ref 0.00–0.07)
Basophils Absolute: 0 10*3/uL (ref 0.0–0.1)
Basophils Relative: 1 %
Eosinophils Absolute: 0.2 10*3/uL (ref 0.0–0.5)
Eosinophils Relative: 3 %
HCT: 38.3 % (ref 36.0–46.0)
Hemoglobin: 12.4 g/dL (ref 12.0–15.0)
Immature Granulocytes: 0 %
Lymphocytes Relative: 37 %
Lymphs Abs: 2.1 10*3/uL (ref 0.7–4.0)
MCH: 30.4 pg (ref 26.0–34.0)
MCHC: 32.4 g/dL (ref 30.0–36.0)
MCV: 93.9 fL (ref 80.0–100.0)
Monocytes Absolute: 0.5 10*3/uL (ref 0.1–1.0)
Monocytes Relative: 8 %
Neutro Abs: 2.9 10*3/uL (ref 1.7–7.7)
Neutrophils Relative %: 51 %
Platelets: 328 10*3/uL (ref 150–400)
RBC: 4.08 MIL/uL (ref 3.87–5.11)
RDW: 12.5 % (ref 11.5–15.5)
WBC: 5.6 10*3/uL (ref 4.0–10.5)
nRBC: 0 % (ref 0.0–0.2)

## 2020-02-24 LAB — COMPREHENSIVE METABOLIC PANEL
ALT: 84 U/L — ABNORMAL HIGH (ref 0–44)
AST: 85 U/L — ABNORMAL HIGH (ref 15–41)
Albumin: 3.6 g/dL (ref 3.5–5.0)
Alkaline Phosphatase: 215 U/L — ABNORMAL HIGH (ref 38–126)
Anion gap: 13 (ref 5–15)
BUN: 25 mg/dL — ABNORMAL HIGH (ref 8–23)
CO2: 26 mmol/L (ref 22–32)
Calcium: 10.1 mg/dL (ref 8.9–10.3)
Chloride: 103 mmol/L (ref 98–111)
Creatinine, Ser: 1.16 mg/dL — ABNORMAL HIGH (ref 0.44–1.00)
GFR calc Af Amer: 56 mL/min — ABNORMAL LOW (ref >60)
GFR calc non Af Amer: 48 mL/min — ABNORMAL LOW (ref >60)
Glucose, Bld: 108 mg/dL — ABNORMAL HIGH (ref 70–99)
Potassium: 4.7 mmol/L (ref 3.5–5.1)
Sodium: 142 mmol/L (ref 135–145)
Total Bilirubin: 0.9 mg/dL (ref 0.3–1.2)
Total Protein: 7.9 g/dL (ref 6.5–8.1)

## 2020-02-24 MED ORDER — FULVESTRANT 250 MG/5ML IM SOLN
INTRAMUSCULAR | Status: AC
  Filled 2020-02-24: qty 10

## 2020-02-24 MED ORDER — FULVESTRANT 250 MG/5ML IM SOLN
500.0000 mg | Freq: Once | INTRAMUSCULAR | Status: AC
  Administered 2020-02-24: 500 mg via INTRAMUSCULAR

## 2020-02-24 NOTE — Progress Notes (Signed)
[name]  Telephone:([phone]) [phone] Fax:(336) [phone]     ID: [name] DOB: 20-Feb-1951  MR#: [id number]  VXB#:[id number]  Patient Care Team: Glendale Chard, MD as PCP - General (Internal Medicine) Magrinat, Virgie Dad, MD as Consulting Physician (Oncology) Rolm Bookbinder, MD as Consulting Physician (General Surgery) Gery Pray, MD as Consulting Physician (Radiation Oncology) [name], MD as Consulting Physician (Ophthalmology)   CHIEF COMPLAINT: triple negative breast cancer; PALB2 positive  CURRENT TREATMENT:Fulvestrant; Palbociclib to start once surgical drains are removed    INTERVAL HISTORY: [name] returns today for evaluation of her now-recurrent left breast cancer.  She underwent a left mastectomy on 02/14/2020 and is recovering from her surgery.    Her mastectomy showed a 2.3 cm invasive ductal carcinoma grade 3, no lymph nodes were biopsied.  She is taking Oxycodone for pain.  She is taking these daily.  She has one more pill left, she received 10.  She says that she has residual soreness at her surgery site.    She is seeing Dr. [name] today to check her drains.  She is still draining some in her remaining drain, so she believes it is unlikely she will have this removed today.     REVIEW OF SYSTEMS: [name] has been active by walking around through her house with a walker.  She has family nearby and grandsons who come in and out of her home.  Her appetite is good.  Her weight remains stable.    Texanna says that her feet and hands get cold more easily since undergoing treatment for her first breast cancer.  She denies any fever or chills.  She denies cough, shortness of breath, chest pain, or palpitations, she is having normal bowel movements, she has no bladder concerns.  She is without nausea, or vomiting.  A detailed ROS was otherwise non contributory.   BREAST CANCER HISTORY: From the original intake note:  [name] herself palpated a  mass in her left breast, and immediately brought it to the attention of her primary care physician, Dr. Baird Cancer, who confirmed the finding and setup the patient for bilateral diagnostic mammography at the [facility] Center 02/20/2014. This showed a high density mass in the outer left breast measuring up to 3.7 cm. It corresponded to the site of palpable concern. In addition the normal 4 logically abnormal lymph nodes in the left axilla. The mass was palpable by exam, and by ultrasonography measured 2.6 cm. There were enlarged and morphologically abnormal lymph nodes in the left axilla, largest measuring 3 cm.  Biopsy of the left breast mass in question and 1 of the abnormal axillary lymph nodes 02/22/2014 showed ([id number]) biopsies to be positive for an invasive ductal carcinoma, grade 3, triple negative, with an MIB-1 of 90%. Specifically the HER-2 signals ratio was 1.05, and the number per cell 2.00.  MRI of the breasts 03/01/2014 showed a 3.8 cm enhancing mass with areas of apparent necrosis in the left breast, as well as the biopsy tract extending laterally, the combined measure 5.3 cm. There were no other masses or areas of abnormal enhancement. There were multiple abnormally enlarged left axillary lymph nodes with loss of the normal fatty hilum. These included level I and retropectoral lymph nodes.  The patient's subsequent history is as detailed below   PAST MEDICAL HISTORY: Past Medical History:  Diagnosis Date  . Colon cancer ([place])   . Hx of rotator cuff surgery   . Hypertension   . left breast cancer   .  Personal history of chemotherapy 2016   left  . Personal history of radiation therapy 2016   left breast   . Radiation 11/15/14-01/02/15   left breast, axillary and supraclavicular region 45 gray, lumpectomy cavity boosted to 16 gray    PAST SURGICAL HISTORY: Past Surgical History:  Procedure Laterality Date  . ABDOMINAL HYSTERECTOMY  1988  . BREAST EXCISIONAL BIOPSY Right  12/21/2017  . BREAST LUMPECTOMY Left 2016  . COLON SURGERY  1988   colectomy/colostomy-ca  . COLON SURGERY  1988   colostomy takedown-reversal  . COLONOSCOPY    . EXCISION / BIOPSY BREAST / NIPPLE / DUCT Left 09/20/2014  . MASTECTOMY Left 02/14/2020  . MASTECTOMY W/ SENTINEL NODE BIOPSY Left 02/14/2020   Procedure: LEFT MASTECTOMY WITH BLUE DYE INJECTION;  Surgeon: Rolm Bookbinder, MD;  Location: [facility];  Service: General;  Laterality: Left;  PEC BLOCK  . RADIOACTIVE SEED GUIDED EXCISIONAL BREAST BIOPSY Right 12/21/2017   Procedure: RIGHT RADIOACTIVE SEED GUIDED EXCISIONAL BREAST BIOPSY ERAS PATHWAY;  Surgeon: Rolm Bookbinder, MD;  Location: [facility];  Service: General;  Laterality: Right;  . RADIOACTIVE SEED GUIDED PARTIAL MASTECTOMY WITH AXILLARY SENTINEL LYMPH NODE BIOPSY Left 09/20/2014   Procedure: RADIOACTIVE SEED GUIDED LEFT BREAST LUMPECTOMY WITH AXILLARY SENTINEL LYMPH NODE BIOPSY;  Surgeon: Rolm Bookbinder, MD;  Location: [facility];  Service: General;  Laterality: Left;  . TOTAL SHOULDER ARTHROPLASTY  2009   right    FAMILY HISTORY Family History  Problem Relation Age of Onset  . Cancer Mother 76       breast  . Breast cancer Mother        early 50s  . Stroke Father 36       cause of death  . Diabetes Father   . Cancer Maternal Aunt        breast cancer at unknown age  . Breast cancer Maternal Aunt   . Ovarian cancer Neg Hx    the patient's father died at the age of 67 following a stroke in the setting of diabetes; the patient's mother is living at 10. The patient has 3 brothers and 2 sisters. [name], [name], was diagnosed with breast cancer in her early 58s. There is no other breast or ovarian cancer in the family. The patient herself was diagnosed with watts by history he is an incidentally found stage I colon carcinoma noted at the time of her hysterectomy. She had a partial colectomy but no adjuvant treatment. We have  no records regarding that procedure   GYNECOLOGIC HISTORY:  No LMP recorded. Patient has had a hysterectomy. Menarche age 37, first live birth age 79, the patient underwent total abdominal hysterectomy with bilateral salpingo-oophorectomy in her 33s. She did not take hormone replacement. She did not take oral contraceptives at any point.   SOCIAL HISTORY: (Updated May 2021 Adonis worked for [employer] for about 40 years, retiring in 2008. She is divorced and lives alone, with no pets.  Her grandson who is a Conservator, museum/gallery frequently stays with her.  Her daughter Zamiyah Resendes works for [employer] in Press photographer. The second daughter, Saige Canton, works as a Scientist, water quality at [employer].  The patient has 6 grandchildren and three great-grandchildren. She attends a [employer]. (updated 09/28/2018)    ADVANCED DIRECTIVES: Not in place. The patient intends to name her daughter Marcello Fennel. her healthcare power of attorney. [name] is work number is [phone]. On the patient's 03/01/2014 visit she was given  a copy of the appropriate documents to complete and notarize at her discretion.     HEALTH MAINTENANCE: Social History   Tobacco Use  . Smoking status: Never Smoker  . Smokeless tobacco: Never Used  Vaping Use  . Vaping Use: Never used  Substance Use Topics  . Alcohol use: Not Currently    Comment: occ-rare  . Drug use: No     Colonoscopy: 2000  PAP: May 2015  Bone density: 12/21/2017, T-score of -0.6  Lipid panel: 02/03/2014  No Known Allergies  Current Outpatient Medications  Medication Sig Dispense Refill  . acetaminophen (TYLENOL) 500 MG tablet Take 1,000 mg by mouth every 6 (six) hours as needed for mild pain or moderate pain.    Marland Kitchen gabapentin (NEURONTIN) 300 MG capsule Take 1 capsule (300 mg total) by mouth at bedtime. 90 capsule 4  . hydroxypropyl methylcellulose / hypromellose (ISOPTO TEARS / GONIOVISC) 2.5 % ophthalmic solution Place 1 drop into both eyes daily as  needed for dry eyes.    . Liniments (SALONPAS ARTHRITIS PAIN RELIEF EX) Apply 1 application topically daily as needed (knee pain).    Marland Kitchen lisinopril-hydrochlorothiazide (ZESTORETIC) 20-12.5 MG tablet Take 1/2 tablet daily by mouth (Patient taking differently: Take 0.5 tablets by mouth 3 (three) times a week. ) 30 tablet 6  . Multiple Vitamins-Minerals (AIRBORNE PO) Take 1 tablet by mouth 2 (two) times a week.    Marland Kitchen oxyCODONE (OXY IR/ROXICODONE) 5 MG immediate release tablet Take 1 tablet (5 mg total) by mouth every 4 (four) hours as needed for moderate pain. 10 tablet 0  . palbociclib (IBRANCE) 125 MG tablet Take 1 tablet (125 mg total) by mouth daily. Take for 21 days on, 7 days off, repeat every 28 days. 21 tablet 6  . ursodiol (ACTIGALL) 500 MG tablet Take 500 mg by mouth 3 (three) times daily.    . potassium chloride (K-DUR) 10 MEQ tablet Take 1 tablet (10 mEq total) by mouth 2 (two) times daily. (Patient not taking: Reported on 12/07/2019) 60 tablet 4   No current facility-administered medications for this visit.    OBJECTIVE:   Vitals:   02/24/20 0818  BP: 128/78  Pulse: 100  Resp: 18  Temp: 98.2 F (36.8 C)  SpO2: 100%    Body mass index is 40.18 kg/m.     ECOG FS:1 - Symptomatic but completely ambulatory GENERAL: Patient is a well appearing female in no acute distress HEENT:  Sclerae anicteric.  Oropharynx clear and moist. No ulcerations or evidence of oropharyngeal candidiasis. Neck is supple.  NODES:  No cervical, supraclavicular, or axillary lymphadenopathy palpated.  BREAST EXAM:  Left breast surgically absent, binder in place, steri strips covering, healing well, no sign of infection, drain present with SS drainage in bulb, no purulence noted LUNGS:  Clear to auscultation bilaterally.  No wheezes or rhonchi. HEART:  Regular rate and rhythm. No murmur appreciated. ABDOMEN:  Soft, nontender.  Positive, normoactive bowel sounds. No organomegaly palpated. MSK:  No focal spinal  tenderness to palpation. Full range of motion bilaterally in the upper extremities. EXTREMITIES:  No peripheral edema.   SKIN:  Clear with no obvious rashes or skin changes. No nail dyscrasia. NEURO:  Nonfocal. Well oriented.  Appropriate affect.     LAB RESULTS:  No results found for: SPEP  Lab Results  Component Value Date   WBC 5.6 02/24/2020   NEUTROABS 2.9 02/24/2020   HGB 12.4 02/24/2020   HCT 38.3 02/24/2020   MCV 93.9 02/24/2020  PLT 328 02/24/2020      Chemistry      Component Value Date/Time   NA 142 02/24/2020 0806   NA 143 12/07/2019 1742   NA 142 11/13/2016 1050   K 4.7 02/24/2020 0806   K 4.1 11/13/2016 1050   CL 103 02/24/2020 0806   CO2 26 02/24/2020 0806   CO2 27 11/13/2016 1050   BUN 25 (H) 02/24/2020 0806   BUN 13 12/07/2019 1742   BUN 11.5 11/13/2016 1050   CREATININE 1.16 (H) 02/24/2020 0806   CREATININE 0.86 01/30/2020 0946   CREATININE 1.0 11/13/2016 1050      Component Value Date/Time   CALCIUM 10.1 02/24/2020 0806   CALCIUM 9.8 11/13/2016 1050   ALKPHOS 215 (H) 02/24/2020 0806   ALKPHOS 140 11/13/2016 1050   AST 85 (H) 02/24/2020 0806   AST 12 (L) 01/30/2020 0946   AST 13 11/13/2016 1050   ALT 84 (H) 02/24/2020 0806   ALT 14 01/30/2020 0946   ALT 15 11/13/2016 1050   BILITOT 0.9 02/24/2020 0806   BILITOT 1.3 (H) 01/30/2020 0946   BILITOT 0.86 11/13/2016 1050       No results found for: LABCA2  No components found for: OIBBC488  No results for input(s): INR in the last 168 hours.  Urinalysis    Component Value Date/Time   COLORURINE YELLOW 04/05/2008 1120   APPEARANCEUR CLEAR 04/05/2008 1120   LABSPEC 1.021 04/05/2008 1120   PHURINE 6.0 04/05/2008 1120   GLUCOSEU NEGATIVE 04/05/2008 1120   HGBUR NEGATIVE 04/05/2008 1120   BILIRUBINUR negative 12/07/2019 1212   KETONESUR NEGATIVE 04/05/2008 1120   PROTEINUR Negative 12/07/2019 1212   PROTEINUR NEGATIVE 04/05/2008 1120   UROBILINOGEN 1.0 12/07/2019 1212    UROBILINOGEN 0.2 04/05/2008 1120   NITRITE negative 12/07/2019 1212   NITRITE NEGATIVE 04/05/2008 1120   LEUKOCYTESUR Negative 12/07/2019 1212    STUDIES: CT Chest W Contrast  Result Date: 02/08/2020 CLINICAL DATA:  Breast cancer. Restaging. History of previous breast cancer with neoadjuvant therapy followed by lumpectomy and adjuvant chemo radiation. New diagnosis in the LEFT breast. EXAM: CT CHEST WITH CONTRAST TECHNIQUE: Multidetector CT imaging of the chest was performed during intravenous contrast administration. CONTRAST:  186m OMNIPAQUE IOHEXOL 300 MG/ML  SOLN COMPARISON:  04/24/2016 FINDINGS: Cardiovascular: Aortic caliber and contour is normal. No pericardial effusion. Normal appearance of central pulmonary vasculature. Limited assessment on venous phase imaging. Heart size is normal. Mediastinum/Nodes: Enlarged heterogeneous thyroid similar to prior exams previously evaluated with ultrasound. No axillary lymphadenopathy with signs of prior LEFT axillary dissection/nodal biopsy. Lymph node in the RIGHT axilla retains a fatty hilum measuring approximately 9 mm. Scattered RIGHT subpectoral lymph nodes are similar to the prior study. No internal mammary nodal enlargement. No mediastinal adenopathy or hilar adenopathy. Lungs/Pleura: Tiny RIGHT lung nodule in the RIGHT upper lobe (image 42, series 5) 4 mm unchanged from 2017. Subtle nodularity along the subpleural lung in the LEFT chest with similar appearance from prior radiotherapy, unchanged. Small RIGHT basilar nodule (image 111, series 5) 6 mm unchanged since 2016. Airways are patent. No consolidation. No pleural effusion. Upper Abdomen: Incidental imaging of upper abdominal contents with lobular hepatic contours and suggestion of hepatic steatosis. Liver is incompletely imaged. Adrenal glands are normal. Spleen is normal size. Musculoskeletal: Signs of LEFT breast lumpectomy. More lobular appearance of this area and increased soft tissue since  previous imaging best exhibited on image 54 series 2, of uncertain significance on CT but in an area reportedly positive  for neoplasm. No acute or destructive bone process. Marked glenohumeral degenerative changes on the LEFT and changes of RIGHT shoulder arthroplasty. IMPRESSION: 1. Increasing soft tissue in the area of prior lumpectomy likely reflects tumor associated with this area, now biopsy-proven. 2. Stable small pulmonary nodules. 3. Hepatic steatosis. 4. Enlarged heterogeneous thyroid similar to prior exams previously evaluated with ultrasound. Electronically Signed   By: Zetta Bills M.D.   On: 02/08/2020 14:07   NM Bone Scan Whole Body  Result Date: 02/08/2020 CLINICAL DATA:  Breast cancer, staging EXAM: NUCLEAR MEDICINE WHOLE BODY BONE SCAN TECHNIQUE: Whole body anterior and posterior images were obtained approximately 3 hours after intravenous injection of radiopharmaceutical. RADIOPHARMACEUTICALS:  21 mCi Technetium-19mMDP IV COMPARISON:  None FINDINGS: Uptake at shoulders, sternoclavicular joints, wrists, knees, and feet, typically degenerative. Uptake at BILATERAL aspect of lower lumbar spine at L5-S1, likely degenerative. No definite worrisome sites of tracer uptake identified to suggest osseous metastatic disease. Asymmetric uptake of tracer at RIGHT breast, nonspecific. Otherwise expected urinary tract and soft tissue distribution of tracer. IMPRESSION: Scattered degenerative type uptake. No definite scintigraphic evidence of osseous metastatic disease. Electronically Signed   By: MLavonia DanaM.D.   On: 02/08/2020 17:38   NM Sentinel Node Inj-No Rpt (Breast)  Result Date: 02/14/2020 Sulfur colloid was injected by the nuclear medicine technologist for melanoma sentinel node.      ASSESSMENT: 69y.o. BRCA negative [place] woman s/p left breast upper outer quadrant and left axillary lymph node biopsy 02/22/2014, both positive for a clinical T2 N2, stage IIIA invasive ductal  carcinoma, grade 3, triple negative, with an MIB-1 of 90%  (1) neoadjuvant chemotherapy started 03/13/2014, with cyclophosphamide and doxorubicin in dose dense fashion x4, with Neulasta support on day 2, completed 04/24/2014, followed by weekly carboplatin and paclitaxel x12, paclitaxel reduced during cycle 5 and cycle 7 because of elevated bilirubin levels  (2) left lumpectomy and sentinel lymph node sampling 09/20/2014 showed a complete pathologic response.  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  (b) PALB2 mutations are known to increase the risk of breast and pancreatic cancer, possibly other cancers, but the data is preliminary  (c) the patient's 2 daughters have been tested and did not carry the gene  (d) Cira's MSH2 VUS has been reclassified as "likely benign"  (e) she is status post remote TAH/BSO  (f) intensified screening, with mammography in February and breast MRI in August planned  (6) staging studies showed right-sided lung nodules, too small to characterize, and a dominant right-sided thyroid nodule unchanged in size as compared to 2011; to be followed  (a) chest CT 04/24/2016 showed the small pulmonary nodules to be completely stable, consistent with benign findings.  (b) thyroid ultrasound--done on 07/22/2018 showed enlarged heterogeneous lobular and multinodular thyroid gland, TI-RADS category 3 nodules, not meeting criteria for further biopsy or dedicated imaging f/u.    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  (8) anastrozole started 07/08/2018  for breast cancer prevention, discontinued 01/30/2020 with disease recurrence  (a) bone density scan 01/01/2018 normal, T score -0.6  (9) RECURRENT DISEASE May 2021  (a) left breast overlapping biopsy  01/20/2020 shows a clinical  T2 N0, stage IIB invasive ductal carcinoma, grade 2 or 3, with moderate estrogen receptor positivity, progesterone receptor negative, HER-2 not amplified, MIB-1 of 80%  (b) Left Mastectomy on 02/14/2020: IDC, grade 3, 2.3 cm, margins negative, T2, NX.   (c) CT of the chest and bone scan on 02/08/2020 shows no distant metastases  (10) to start fulvestrant and palbociclib 02/24/2020   PLAN: Almee is here today following her surgery of her recurrent estrogen positive breast cancer.  She is going to start treatment with Fulvestrant today.    Vaughan Basta and I reviewed her pathology results and the fact that her margins are negative, meaning that Dr. [name] removed all of the cancer in her breast.  We reviewed her scans that show no evidence of distant metastases.  I printed her a copy of these results and gave them to her today.  We reviewed her plan of care, and Ahlana has a good understanding.  She knows that she is on the Fulvestrant and the Palbociclib to help reduce her risk of recurrence of her cancer, as she could very well have microscopic disease in her body that we cannot see.  She understands this.    Since Amity still has the drain in place from her surgery, she will not start treatment with Palbociclib.  Instead, we will wait two weeks and see her back prior to her next injection dose.    Sherlin will return as noted above.  She has a good understanding of the above.    Total encounter time 30 minutes.Wilber Bihari, NP 02/24/20 8:50 AM Medical Oncology and Hematology [facility] [place], [place] [address] Tel. [phone]    Fax. [phone]    *Total Encounter Time as defined by the Centers for Medicare and Medicaid  Services includes, in addition to the face-to-face time of a patient visit (documented in the note above) non-face-to-face time: obtaining and reviewing outside history, ordering and reviewing medications, tests or procedures, care coordination (communications with other health care professionals or caregivers) and documentation in the medical record.

## 2020-02-24 NOTE — Telephone Encounter (Signed)
Scheduled appts per 6/11 los. Gave pt a print out of AVS.

## 2020-02-28 DIAGNOSIS — C50912 Malignant neoplasm of unspecified site of left female breast: Secondary | ICD-10-CM | POA: Diagnosis not present

## 2020-02-28 MED FILL — LISINOPRIL-HCTZ 20-12.5 MG: 20-12.5 | 30 days supply | Qty: 30 | Fill #3

## 2020-03-05 ENCOUNTER — Ambulatory Visit: Payer: PPO | Attending: General Surgery

## 2020-03-05 ENCOUNTER — Other Ambulatory Visit: Payer: Self-pay

## 2020-03-05 DIAGNOSIS — M25612 Stiffness of left shoulder, not elsewhere classified: Secondary | ICD-10-CM | POA: Insufficient documentation

## 2020-03-05 DIAGNOSIS — M79622 Pain in left upper arm: Secondary | ICD-10-CM | POA: Diagnosis not present

## 2020-03-05 DIAGNOSIS — Z483 Aftercare following surgery for neoplasm: Secondary | ICD-10-CM | POA: Diagnosis not present

## 2020-03-05 DIAGNOSIS — C50912 Malignant neoplasm of unspecified site of left female breast: Secondary | ICD-10-CM | POA: Diagnosis not present

## 2020-03-05 DIAGNOSIS — M25512 Pain in left shoulder: Secondary | ICD-10-CM

## 2020-03-05 DIAGNOSIS — M25611 Stiffness of right shoulder, not elsewhere classified: Secondary | ICD-10-CM

## 2020-03-05 DIAGNOSIS — R600 Localized edema: Secondary | ICD-10-CM | POA: Diagnosis not present

## 2020-03-05 NOTE — Patient Instructions (Signed)
Access Code: [email or web address] URL: [email or web address] Date: 03/05/2020 Prepared by: Tomma Rakers  Exercises Seated Shoulder flexion Assisted ROM - 1 x daily - 7 x weekly - 1 sets - 10 reps Shoulder External Rotation and Scapular Retraction - 1 x daily - 7 x weekly - 1 sets - 10 reps - you can do this sitting down hold Shoulder Flexion Wall Slide with Towel - 1 x daily - 7 x weekly - 1 sets - 10 reps Standing Shoulder Abduction Slides at Wall - 1 x daily - 7 x weekly - 1 sets - 10 reps Standing Shoulder External Rotation Stretch in Doorway - 1 x daily - 7 x weekly - 1 sets - 3 reps - 10 seconds hold

## 2020-03-05 NOTE — Therapy (Signed)
[place], [place], [address] Phone: [phone]   Fax:  [phone]  Physical Therapy Evaluation  Patient Details  Name: [name] MRN: [id number] Date of Birth: 04-16-1951 Referring Provider (PT): Rolm Bookbinder MD   Encounter Date: 03/05/2020   PT End of Session - 03/05/20 1016    Visit Number 1    Number of Visits 11    Date for PT Re-Evaluation 04/16/20    PT Start Time 0905    PT Stop Time 1000    PT Time Calculation (min) 55 min    Activity Tolerance Patient tolerated treatment well    Behavior During Therapy [facility] for tasks assessed/performed           Past Medical History:  Diagnosis Date  . Colon cancer ([place])   . Hx of rotator cuff surgery   . Hypertension   . left breast cancer   . Personal history of chemotherapy 2016   left  . Personal history of radiation therapy 2016   left breast   . Radiation 11/15/14-01/02/15   left breast, axillary and supraclavicular region 45 gray, lumpectomy cavity boosted to 16 gray    Past Surgical History:  Procedure Laterality Date  . ABDOMINAL HYSTERECTOMY  1988  . BREAST EXCISIONAL BIOPSY Right 12/21/2017  . BREAST LUMPECTOMY Left 2016  . COLON SURGERY  1988   colectomy/colostomy-ca  . COLON SURGERY  1988   colostomy takedown-reversal  . COLONOSCOPY    . EXCISION / BIOPSY BREAST / NIPPLE / DUCT Left 09/20/2014  . MASTECTOMY Left 02/14/2020  . MASTECTOMY W/ SENTINEL NODE BIOPSY Left 02/14/2020   Procedure: LEFT MASTECTOMY WITH BLUE DYE INJECTION;  Surgeon: Rolm Bookbinder, MD;  Location: [place];  Service: General;  Laterality: Left;  PEC BLOCK  . RADIOACTIVE SEED GUIDED EXCISIONAL BREAST BIOPSY Right 12/21/2017   Procedure: RIGHT RADIOACTIVE SEED GUIDED EXCISIONAL BREAST BIOPSY ERAS PATHWAY;  Surgeon: Rolm Bookbinder, MD;  Location: [place];  Service: General;  Laterality: Right;  . RADIOACTIVE SEED GUIDED  PARTIAL MASTECTOMY WITH AXILLARY SENTINEL LYMPH NODE BIOPSY Left 09/20/2014   Procedure: RADIOACTIVE SEED GUIDED LEFT BREAST LUMPECTOMY WITH AXILLARY SENTINEL LYMPH NODE BIOPSY;  Surgeon: Rolm Bookbinder, MD;  Location: [place];  Service: General;  Laterality: Left;  . TOTAL SHOULDER ARTHROPLASTY  2009   right    There were no vitals filed for this visit.    Subjective Assessment - 03/05/20 0908    Subjective Pt states that she has been trying to move her arm but it has been stiff since surgery. She states that previous to that she occasionally would get headaches from her surgery in 2016 but had no difficulty moving her arm. Pt states that she has finished the oxycodone and has just been taking Tylenol but she does not get a lot of relief. She states that at night when she has pain she just tries to go to sleep. Her children have been fixing some meals for her and cleaning her house but she is able to shower, fix meals and get dressed independently.    Pertinent History Partial masectomy in the L breast in 2016 with SLNB, chemotherapy and radiation. Recent L masectomy with no lymph node removal 02/14/2020    Patient Stated Goals I want to get my arm working again and get the pain better.    Currently in Pain? Yes    Pain Score 3     Pain Location  Chest    Pain Orientation Left    Pain Descriptors / Indicators Aching;Sharp    Pain Type Surgical pain    Pain Radiating Towards to the axilla    Pain Onset 1 to 4 weeks ago    Pain Frequency Intermittent    Aggravating Factors  Lifting her arm    Pain Relieving Factors rest, medication    Effect of Pain on Daily Activities Pt has pain and difficulty moving but is able to do the things she needs to do.              [facility] PT Assessment - 03/05/20 0001      Assessment   Medical Diagnosis L breast cancer     Referring Provider (PT) Rolm Bookbinder MD    Onset Date/Surgical Date 02/14/20    Hand Dominance Right    Prior  Therapy Before her surgery she had physical therapy visits      Precautions   Precautions Other (comment)    Precaution Comments Breast cancer/masectomy       Balance Screen   Has the patient fallen in the past 6 months No    Has the patient had a decrease in activity level because of a fear of falling?  Yes    Is the patient reluctant to leave their home because of a fear of falling?  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       Posture/Postural Control   Posture/Postural Control Postural limitations    Postural Limitations Rounded Shoulders;Forward head      ROM / Strength   AROM / PROM / Strength AROM      AROM   AROM Assessment Site Shoulder    Right/Left Shoulder Right;Left    Right Shoulder Flexion 132 Degrees    Right Shoulder ABduction 108 Degrees    Right Shoulder Internal Rotation 16 Degrees    Right Shoulder External Rotation 75 Degrees    Left Shoulder Flexion 86 Degrees    Left Shoulder ABduction 76 Degrees             LYMPHEDEMA/ONCOLOGY  QUESTIONNAIRE - 03/05/20 0001      Type   Cancer Type L breast cancer      Surgeries   Mastectomy Date 02/14/20    Sentinel Lymph Node Biopsy Date --   2016   Number Lymph Nodes Removed 3  Treatment   Active Chemotherapy Treatment Yes    Past Chemotherapy Treatment Yes    Active Radiation Treatment No    Past Radiation Treatment Yes    Body Site L breast, axilla    Current Hormone Treatment Yes    Past Hormone Therapy Yes    Drug Name Anastrozole      What other symptoms do you have   Are you Having Heaviness or Tightness Yes    Are you having Pain Yes    Are you having pitting edema Yes    Body Site L upper arm     Is it Hard or Difficult finding clothes that fit No    Do you have infections No    Is there Decreased scar mobility Yes      Lymphedema Assessments   Lymphedema Assessments Upper extremities      Right Upper Extremity Lymphedema   15 cm Proximal to Olecranon Process 45.6 cm    10 cm Proximal to Olecranon Process 42.4 cm    Olecranon Process 27 cm    15 cm Proximal to Ulnar Styloid Process 27.6 cm    10 cm Proximal to Ulnar Styloid Process 24.5 cm    Just Proximal to Ulnar Styloid Process 18 cm    Across Hand at [employer] 19.8 cm    At [place] of 2nd Digit 6.4 cm      Left Upper Extremity Lymphedema   15 cm Proximal to Olecranon Process 44 cm    10 cm Proximal to Olecranon Process 41 cm    Olecranon Process 27 cm    15 cm Proximal to Ulnar Styloid Process 27.8 cm    10 cm Proximal to Ulnar Styloid Process 23.7 cm    Just Proximal to Ulnar Styloid Process 18.5 cm    Across Hand at [employer] 19.7 cm    At [place] of 2nd Digit 6.8 cm                 Quick Dash - 03/05/20 0001    Open a tight or new jar Severe difficulty    Do heavy household chores (wash walls, wash floors) Moderate difficulty    Carry a shopping bag or briefcase Moderate difficulty    Wash your back Severe difficulty    Use a knife to cut food Severe difficulty     Recreational activities in which you take some force or impact through your arm, shoulder, or hand (golf, hammering, tennis) Severe difficulty    During the past week, to what extent has your arm, shoulder or hand problem interfered with your normal social activities with family, friends, neighbors, or groups? Modererately    During the past week, to what extent has your arm, shoulder or hand problem limited your work or other regular daily activities Slightly    Arm, shoulder, or hand pain. Moderate    Tingling (pins and needles) in your arm, shoulder, or hand Moderate    Difficulty Sleeping No difficulty    DASH Score 52.27 %            Objective measurements completed on examination: See above findings.               PT Education - 03/05/20 0953    Education Details Access Code: [email or web address], Pt and daughter were educated on plan over care and importance prevent compensation to get actual movement at the LUE. DIscussed briefly pt risk for lymphedema due to previous radiation  therapy and now masectomy. Tried multiple variations of external rotation in order to make sure that patient is getting adequate mobility at the LUE due to compensation with trunk rotation elbow flexion when trying to perform external rotation activities.    Person(s) Educated Patient;Child(ren)    Methods Explanation;Demonstration;Verbal cues;Handout;Tactile cues    Comprehension Verbalized understanding;Returned demonstration            PT Short Term Goals - 03/05/20 1036      PT SHORT TERM GOAL #1   Title Pt will be independent with HEP and self MLD within 2 weeks in order to promote autonomy of care.    Baseline Pt was provided with HEP today    Time 2    Period Weeks    Status New    Target Date 03/26/20             PT Long Term Goals - 03/05/20 1039      PT LONG TERM GOAL #1   Title Pt will improve R shoulder abduction, L shoulder flexion and abduction to 120 degrees within 5 weeks in  order to demonstrate improved functional ROM.    Baseline R shoulder flexion: 132 abduction: 108 L shoulder flexion: 86 abduction: 76    Time 5    Period Weeks    Status New    Target Date 04/16/20      PT LONG TERM GOAL #2   Title Pt be measured for and receive appropriate compression garment to wear on a daily basis to manage edema at home.    Baseline currently pt has just regular bras with masectomy prosthesis in it.    Time 5    Period Weeks    Status New    Target Date 04/16/20      PT LONG TERM GOAL #3   Title Pt will improve DASH Score to 30% or less disaibility in order to demonstrate a functional improvement in the LUE.    Baseline 52.27                  Plan - 03/05/20 1017    Clinical Impression Statement Pt presents to physical therapy after L masectomy with no lymph node removal. She has had a previous partial L masectomy with 3 lymph node removal with radiation and chemotherapy. She is getting just chemotherapy after her masectomy this time. Pt demonstrates significant decrease in BIl shoulder ROM with L>R and has significant compensations with L external rotation including trunk rotation, elbow flexion/extension that is difficult for her to over come. Tried P/ROM external rotation in supine, sitting and at the door frame. Pt was able to get best mobility at the door frame. On palpation pt demonstration and observation pt demonstrates flulid in the posterior L upper quadrant as demonstrated by larger fold than on the R with soft non-fibrotic changes. Pt will benefit from skilled phyical therapy services in order to address the above limitations 2x/week for 5 weeks to decrease risk for immobility and to improve independence.    Personal Factors and Comorbidities Comorbidity 2;Fitness    Comorbidities 02/14/2020 L masectomy, 2016 partial L masectomy with 3 lymph node removal and radiation, lumpectomy of the R breast papilloma    Stability/Clinical Decision Making  Stable/Uncomplicated    Clinical Decision Making Low    Rehab Potential Good    PT Frequency 2x / week    PT Duration --   5 weeks   PT Treatment/Interventions Therapeutic exercise;Therapeutic activities;Neuromuscular re-education;Manual techniques  PT Next Visit Plan P/ROM, easy myofascial release STM as needed, teach self MLD    PT Home Exercise Plan Access Code: [email or web address]    Consulted and Agree with Plan of Care Patient           Patient will benefit from skilled therapeutic intervention in order to improve the following deficits and impairments:  Increased edema, Increased fascial restricitons, Decreased range of motion, Decreased knowledge of precautions, Pain  Visit Diagnosis: Recurrent cancer of left breast (HCC)  Acute pain of left shoulder  Stiffness of left shoulder, not elsewhere classified  Stiffness of right shoulder, not elsewhere classified  Localized edema  Left axillary pain     Problem List Patient Active Problem List   Diagnosis Date Noted  . Recurrent cancer of left breast ([place]) 02/14/2020  . Recurrent breast adenocarcinoma ([place]) 01/30/2020  . Essential hypertension 12/07/2019  . Dyspnea on exertion 11/11/2018  . Right leg pain 11/11/2018  . Obesity, morbid, BMI 40.0-49.9 ([place]) 10/11/2015  . 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 Genetic testing 05/24/2015  . Elevated LFTs 03/08/2015  . Anemia in neoplastic disease 06/12/2014  . Hypokalemia 05/29/2014  . Watery eyes 05/09/2014  . Malignant neoplasm of colon ([place]) 04/03/2014  . Family history of malignant neoplasm of breast 04/03/2014  . Malignant neoplasm of upper-outer quadrant of left breast in female, estrogen receptor negative ([place]) 03/01/2014    Ander Purpura, PT 03/05/2020, 11:06 AM  [facility], [place], [address] Phone: [phone]   Fax:  [phone]  Name: [name] MRN:  [id number] Date of Birth: [birth year]

## 2020-03-07 ENCOUNTER — Ambulatory Visit: Payer: PPO

## 2020-03-07 ENCOUNTER — Other Ambulatory Visit: Payer: Self-pay

## 2020-03-07 ENCOUNTER — Telehealth (INDEPENDENT_AMBULATORY_CARE_PROVIDER_SITE_OTHER): Payer: PPO | Admitting: Nurse Practitioner

## 2020-03-07 DIAGNOSIS — M25612 Stiffness of left shoulder, not elsewhere classified: Secondary | ICD-10-CM

## 2020-03-07 DIAGNOSIS — M79622 Pain in left upper arm: Secondary | ICD-10-CM

## 2020-03-07 DIAGNOSIS — C50912 Malignant neoplasm of unspecified site of left female breast: Secondary | ICD-10-CM | POA: Diagnosis not present

## 2020-03-07 DIAGNOSIS — Z9012 Acquired absence of left breast and nipple: Secondary | ICD-10-CM | POA: Diagnosis not present

## 2020-03-07 DIAGNOSIS — M25611 Stiffness of right shoulder, not elsewhere classified: Secondary | ICD-10-CM

## 2020-03-07 DIAGNOSIS — R600 Localized edema: Secondary | ICD-10-CM

## 2020-03-07 DIAGNOSIS — M25512 Pain in left shoulder: Secondary | ICD-10-CM

## 2020-03-07 NOTE — Progress Notes (Signed)
Virtual Visit via MyChart    This visit type was conducted due to national recommendations for restrictions regarding the COVID-19 Pandemic (e.g. social distancing) in an effort to limit this patient's exposure and mitigate transmission in our community.  Due to her co-morbid illnesses, this patient is at least at moderate risk for complications without adequate follow up.  This format is felt to be most appropriate for this patient at this time.  All issues noted in this document were discussed and addressed.  A limited physical exam was performed with this format.    This visit type was conducted due to national recommendations for restrictions regarding the COVID-19 Pandemic (e.g. social distancing) in an effort to limit this patient's exposure and mitigate transmission in our community.  Patients identity confirmed using two different identifiers.  This format is felt to be most appropriate for this patient at this time.  All issues noted in this document were discussed and addressed.  No physical exam was performed (except for noted visual exam findings with Video Visits).    Date:  04/01/2020   ID:  [name], DOB Sep 20, 1950, MRN [id number]  Patient Location:  Home - spoke with Tresa Res and Lowry Ram  Provider location:   Office    Chief Complaint:  Hospital follow up from left mastectomy  History of Present Illness:    [name] is a 69 y.o. female who presents via video conferencing for a telehealth visit today.    The patient does not have symptoms concerning for COVID-19 infection (fever, chills, cough, or new shortness of breath).   Telephone visit today due to hospital admission follow up after having a Left mastectomy due to recurring breast cancer (Palb2 mutation). She was admitted from 6/1-6/2 without any issues.  She is to follow up with the Surgeon on June 29th.  She is scheduled to see the Oncologist later this week. Her daughter has  been assisting her with bathing.  She has been able to do everything else herself.   She is taking Tylenol for her pain, will take the Oxycodone when she has severe pain.      Past Medical History:  Diagnosis Date  . Colon cancer ([place])   . Hx of rotator cuff surgery   . Hypertension   . left breast cancer   . Personal history of chemotherapy 2016   left  . Personal history of radiation therapy 2016   left breast   . Radiation 11/15/14-01/02/15   left breast, axillary and supraclavicular region 45 gray, lumpectomy cavity boosted to 16 gray   Past Surgical History:  Procedure Laterality Date  . ABDOMINAL HYSTERECTOMY  1988  . BREAST EXCISIONAL BIOPSY Right 12/21/2017  . BREAST LUMPECTOMY Left 2016  . COLON SURGERY  1988   colectomy/colostomy-ca  . COLON SURGERY  1988   colostomy takedown-reversal  . COLONOSCOPY    . EXCISION / BIOPSY BREAST / NIPPLE / DUCT Left 09/20/2014  . IR IMAGING GUIDED PORT INSERTION  03/22/2020  . MASTECTOMY Left 02/14/2020  . MASTECTOMY W/ SENTINEL NODE BIOPSY Left 02/14/2020   Procedure: LEFT MASTECTOMY WITH BLUE DYE INJECTION;  Surgeon: Rolm Bookbinder, MD;  Location: [place];  Service: General;  Laterality: Left;  PEC BLOCK  . RADIOACTIVE SEED GUIDED EXCISIONAL BREAST BIOPSY Right 12/21/2017   Procedure: RIGHT RADIOACTIVE SEED GUIDED EXCISIONAL BREAST BIOPSY ERAS PATHWAY;  Surgeon: Rolm Bookbinder, MD;  Location: [place];  Service: General;  Laterality: Right;  . RADIOACTIVE  SEED GUIDED PARTIAL MASTECTOMY WITH AXILLARY SENTINEL LYMPH NODE BIOPSY Left 09/20/2014   Procedure: RADIOACTIVE SEED GUIDED LEFT BREAST LUMPECTOMY WITH AXILLARY SENTINEL LYMPH NODE BIOPSY;  Surgeon: Rolm Bookbinder, MD;  Location: [place];  Service: General;  Laterality: Left;  . TOTAL SHOULDER ARTHROPLASTY  2009   right     Current Meds  Medication Sig  . acetaminophen (TYLENOL) 500 MG tablet Take 1,000 mg by mouth every 6 (six) hours as  needed for mild pain or moderate pain.  Marland Kitchen gabapentin (NEURONTIN) 300 MG capsule Take 1 capsule (300 mg total) by mouth at bedtime.  . hydroxypropyl methylcellulose / hypromellose (ISOPTO TEARS / GONIOVISC) 2.5 % ophthalmic solution Place 1 drop into both eyes daily as needed for dry eyes.  . Liniments (SALONPAS ARTHRITIS PAIN RELIEF EX) Apply 1 application topically daily as needed (knee pain).  Marland Kitchen lisinopril-hydrochlorothiazide (ZESTORETIC) 20-12.5 MG tablet Take 1/2 tablet daily by mouth (Patient taking differently: Take 1 tablet by mouth daily. )  . Multiple Vitamins-Minerals (AIRBORNE PO) Take 1 tablet by mouth 2 (two) times a week.  . ursodiol (ACTIGALL) 500 MG tablet Take 500 mg by mouth 3 (three) times daily.      Allergies:   Patient has no known allergies.   Social History   Tobacco Use  . Smoking status: Never Smoker  . Smokeless tobacco: Never Used  Vaping Use  . Vaping Use: Never used  Substance Use Topics  . Alcohol use: Not Currently    Comment: occ-rare  . Drug use: No     Family Hx: The patient's family history includes Breast cancer in her maternal aunt and mother; Cancer in her maternal aunt; Cancer (age of onset: 57) in her mother; Diabetes in her father; Stroke (age of onset: 41) in her father. There is no history of Ovarian cancer.  ROS:   Please see the history of present illness.    Review of Systems  Constitutional: Negative.  Negative for malaise/fatigue.  Respiratory: Negative.   Cardiovascular: Negative.   Musculoskeletal: Negative.   Neurological: Negative.   Psychiatric/Behavioral: Negative.     All other systems reviewed and are negative.   Labs/Other Tests and Data Reviewed:    Recent Labs: 03/27/2020: ALT 18; BUN 15; Creatinine, Ser 0.92; Hemoglobin 11.0; Platelets 243; Potassium 4.0; Sodium 141   Recent Lipid Panel Lab Results  Component Value Date/Time   CHOL 167 12/07/2019 05:42 PM   TRIG 98 12/07/2019 05:42 PM   HDL 75 12/07/2019  05:42 PM   CHOLHDL 2.2 12/07/2019 05:42 PM   CHOLHDL 2.7 04/13/2010 04:03 AM   LDLCALC 74 12/07/2019 05:42 PM    Wt Readings from Last 3 Encounters:  03/27/20 230 lb 6.4 oz (104.5 kg)  03/22/20 226 lb (102.5 kg)  03/08/20 228 lb 12.8 oz (103.8 kg)     Exam:    Vital Signs:  There were no vitals taken for this visit.    Physical Exam Constitutional:      General: She is not in acute distress.    Appearance: Normal appearance.  Pulmonary:     Effort: Pulmonary effort is normal. No respiratory distress (none that I could hear in her voice).  Neurological:     Mental Status: She is alert.     ASSESSMENT & PLAN:    1. Recurrent cancer of left breast [facility])  She is doing well since being home from her left mastectomy. Continues to be followed by Oncology  No vital signs done this  visit due to telephone visit and does not have access to blood pressure cuff.  No new medications or changes in medications  2. S/P mastectomy, left TCM Performed. A member of the clinical team spoke with the patient upon dischare. Discharge summary was reviewed in full detail during the visit. Meds reconciled and compared to discharge meds. Medication list is updated and reviewed with the patient.  Greater than 50% face to face time was spent in counseling an coordination of care.  All questions were answered to the satisfaction of the patient.   Telephone visit due to patient and family having difficulty with getting online so I was unable to see the bandage of her mastectomy    COVID-19 Education: The signs and symptoms of COVID-19 were discussed with the patient and how to seek care for testing (follow up with PCP or arrange E-visit).  The importance of social distancing was discussed today.  Patient Risk:   After full review of this patients clinical status, I feel that they are at least moderate risk at this time.  Time:   Today, I have spent 15 minutes/ seconds with the patient with  telehealth technology discussing above diagnoses.     Medication Adjustments/Labs and Tests Ordered: Current medicines are reviewed at length with the patient today.  Concerns regarding medicines are outlined above.   Tests Ordered: No orders of the defined types were placed in this encounter.   Medication Changes: No orders of the defined types were placed in this encounter.   Disposition:  Follow up prn  Signed, Minette Brine, FNP

## 2020-03-07 NOTE — Therapy (Signed)
[place], [place], [address] Phone: [phone]   Fax:  [phone]  Physical Therapy Treatment  Patient Details  Name: [name] MRN: [id number] Date of Birth: [birth year] Referring Provider (PT): Rolm Bookbinder MD   Encounter Date: 03/07/2020   PT End of Session - 03/07/20 0911    Visit Number 2    Number of Visits 11    Date for PT Re-Evaluation 04/16/20    PT Start Time 0907    PT Stop Time 0950    PT Time Calculation (min) 43 min    Activity Tolerance Patient tolerated treatment well    Behavior During Therapy [facility] for tasks assessed/performed           Past Medical History:  Diagnosis Date  . Colon cancer ([place])   . Hx of rotator cuff surgery   . Hypertension   . left breast cancer   . Personal history of chemotherapy 2016   left  . Personal history of radiation therapy 2016   left breast   . Radiation 11/15/14-01/02/15   left breast, axillary and supraclavicular region 45 gray, lumpectomy cavity boosted to 16 gray    Past Surgical History:  Procedure Laterality Date  . ABDOMINAL HYSTERECTOMY  1988  . BREAST EXCISIONAL BIOPSY Right 12/21/2017  . BREAST LUMPECTOMY Left 2016  . COLON SURGERY  1988   colectomy/colostomy-ca  . COLON SURGERY  1988   colostomy takedown-reversal  . COLONOSCOPY    . EXCISION / BIOPSY BREAST / NIPPLE / DUCT Left 09/20/2014  . MASTECTOMY Left 02/14/2020  . MASTECTOMY W/ SENTINEL NODE BIOPSY Left 02/14/2020   Procedure: LEFT MASTECTOMY WITH BLUE DYE INJECTION;  Surgeon: Rolm Bookbinder, MD;  Location: [place];  Service: General;  Laterality: Left;  PEC BLOCK  . RADIOACTIVE SEED GUIDED EXCISIONAL BREAST BIOPSY Right 12/21/2017   Procedure: RIGHT RADIOACTIVE SEED GUIDED EXCISIONAL BREAST BIOPSY ERAS PATHWAY;  Surgeon: Rolm Bookbinder, MD;  Location: [place];  Service: General;  Laterality: Right;  . RADIOACTIVE SEED GUIDED  PARTIAL MASTECTOMY WITH AXILLARY SENTINEL LYMPH NODE BIOPSY Left 09/20/2014   Procedure: RADIOACTIVE SEED GUIDED LEFT BREAST LUMPECTOMY WITH AXILLARY SENTINEL LYMPH NODE BIOPSY;  Surgeon: Rolm Bookbinder, MD;  Location: [place];  Service: General;  Laterality: Left;  . TOTAL SHOULDER ARTHROPLASTY  2009   right    There were no vitals filed for this visit.   Subjective Assessment - 03/07/20 0911    Subjective Pt reports thta where the took the drain out and it is open is bothering her the most. She reports that her shoulder is still tight and she has the most difficulty with abduction.    Pertinent History Partial masectomy in the L breast in 2016 with SLNB, chemotherapy and radiation. Recent L masectomy with no lymph node removal 02/14/2020    Patient Stated Goals I want to get my arm working again and get the pain better.    Pain Score 3     Pain Location Abdomen    Pain Orientation Left;Upper    Pain Descriptors / Indicators Aching    Pain Type Surgical pain    Pain Onset 1 to 4 weeks ago    Pain Frequency Intermittent    Aggravating Factors  stretching    Pain Relieving Factors covering it up  [facility] Adult PT Treatment/Exercise - 03/07/20 0001      Manual Therapy   Manual Therapy Soft tissue mobilization;Myofascial release;Passive ROM    Soft tissue mobilization Extra time spent today on easy light STM to the L pectoralis major with pt in neutral and in abduction as well as petrissage along cording noted in L axilla with increased ROM into abduction from lateral L breast to antecubital fossa.     Myofascial Release easy longitudinal myofascial release along the medial/superior L breast and in the medial brachium.     Passive ROM P/ROM multiple times into flexion/abduction and external rotation with oscillations and positioning in order to decreas guarding with intermittent STM to the L pectoralis major and petrissage 10x  over cording in the L axilla during abduction.                   PT Education - 03/07/20 1002    Education Details Pt will continue working on HEP. She was educated that she may have slight increased soreness tomorrow following STM and P/ROM especially due to increased guarding.    Person(s) Educated Patient    Methods Explanation    Comprehension Verbalized understanding            PT Short Term Goals - 03/05/20 1036      PT SHORT TERM GOAL #1   Title Pt will be independent with HEP and self MLD within 2 weeks in order to promote autonomy of care.    Baseline Pt was provided with HEP today    Time 2    Period Weeks    Status New    Target Date 03/26/20             PT Long Term Goals - 03/05/20 1039      PT LONG TERM GOAL #1   Title Pt will improve R shoulder abduction, L shoulder flexion and abduction to 120 degrees within 5 weeks in order to demonstrate improved functional ROM.    Baseline R shoulder flexion: 132 abduction: 108 L shoulder flexion: 86 abduction: 76    Time 5    Period Weeks    Status New    Target Date 04/16/20      PT LONG TERM GOAL #2   Title Pt be measured for and receive appropriate compression garment to wear on a daily basis to manage edema at home.    Baseline currently pt has just regular bras with masectomy prosthesis in it.    Time 5    Period Weeks    Status New    Target Date 04/16/20      PT LONG TERM GOAL #3   Title Pt will improve DASH Score to 30% or less disaibility in order to demonstrate a functional improvement in the LUE.    Baseline 52.27                 Plan - 03/07/20 0911    Clinical Impression Statement Palpable tightness/tenderness noted at the L pectoralis major; easy STM over this area to prevent significant soreness but to help decrease guarding of the LUE to improve ROM. P/ROM performed into flexion/abduction and external rotation; as pt gained ROM into abduction cording was noted from the L lateral  breast to the antecubital fossa medially. Petrissage with P/ROM was performed to help decrease scar tissue adhesions with good results but pt continues with guarding of the L pectoralis limiting ROM. Discussed performing easy stretching on the L UE  without aggravating insicsion areas or drainage openings. Pt will benefit from continued POC at this time.    Personal Factors and Comorbidities Comorbidity 2;Fitness    Comorbidities 02/14/2020 L masectomy, 2016 partial L masectomy with 3 lymph node removal and radiation, lumpectomy of the R breast papilloma    Rehab Potential Good    PT Frequency 2x / week    PT Treatment/Interventions Therapeutic exercise;Therapeutic activities;Neuromuscular re-education;Manual techniques    PT Next Visit Plan P/ROM, easy myofascial release STM as needed, teach self MLD    PT Home Exercise Plan Access Code: [email or web address]    Consulted and Agree with Plan of Care Patient           Patient will benefit from skilled therapeutic intervention in order to improve the following deficits and impairments:  Increased edema, Increased fascial restricitons, Decreased range of motion, Decreased knowledge of precautions, Pain  Visit Diagnosis: Recurrent cancer of left breast (HCC)  Acute pain of left shoulder  Stiffness of left shoulder, not elsewhere classified  Stiffness of right shoulder, not elsewhere classified  Localized edema  Left axillary pain     Problem List Patient Active Problem List   Diagnosis Date Noted  . Recurrent cancer of left breast ([place]) 02/14/2020  . Recurrent breast adenocarcinoma ([place]) 01/30/2020  . Essential hypertension 12/07/2019  . Dyspnea on exertion 11/11/2018  . Right leg pain 11/11/2018  . Obesity, morbid, BMI 40.0-49.9 ([place]) 10/11/2015  . 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 Genetic testing 05/24/2015  . Elevated LFTs 03/08/2015  . Anemia in neoplastic disease 06/12/2014  . Hypokalemia 05/29/2014  . Watery eyes  05/09/2014  . Malignant neoplasm of colon ([place]) 04/03/2014  . Family history of malignant neoplasm of breast 04/03/2014  . Malignant neoplasm of upper-outer quadrant of left breast in female, estrogen receptor negative ([place]) 03/01/2014    Ander Purpura, PT 03/07/2020, 10:06 AM  [place], [place], [address] Phone: [phone]   Fax:  [phone]  Name: [name] MRN: [id number] Date of Birth: 25-May-1951

## 2020-03-08 ENCOUNTER — Inpatient Hospital Stay: Payer: PPO

## 2020-03-08 ENCOUNTER — Encounter: Payer: Self-pay | Admitting: Adult Health

## 2020-03-08 ENCOUNTER — Inpatient Hospital Stay: Payer: PPO | Admitting: Adult Health

## 2020-03-08 ENCOUNTER — Other Ambulatory Visit: Payer: Self-pay

## 2020-03-08 VITALS — BP 121/46 | HR 102 | Temp 98.5°F | Resp 16 | Ht 63.0 in | Wt 228.8 lb

## 2020-03-08 DIAGNOSIS — C50912 Malignant neoplasm of unspecified site of left female breast: Secondary | ICD-10-CM

## 2020-03-08 DIAGNOSIS — Z1509 Genetic susceptibility to other malignant neoplasm: Secondary | ICD-10-CM | POA: Diagnosis not present

## 2020-03-08 DIAGNOSIS — C50919 Malignant neoplasm of unspecified site of unspecified female breast: Secondary | ICD-10-CM

## 2020-03-08 DIAGNOSIS — Z1589 Genetic susceptibility to other disease: Secondary | ICD-10-CM

## 2020-03-08 DIAGNOSIS — Z1502 Genetic susceptibility to malignant neoplasm of ovary: Secondary | ICD-10-CM

## 2020-03-08 DIAGNOSIS — Z171 Estrogen receptor negative status [ER-]: Secondary | ICD-10-CM | POA: Diagnosis not present

## 2020-03-08 DIAGNOSIS — C50412 Malignant neoplasm of upper-outer quadrant of left female breast: Secondary | ICD-10-CM

## 2020-03-08 DIAGNOSIS — Z5112 Encounter for antineoplastic immunotherapy: Secondary | ICD-10-CM | POA: Diagnosis not present

## 2020-03-08 LAB — COMPREHENSIVE METABOLIC PANEL
ALT: 22 U/L (ref 0–44)
AST: 14 U/L — ABNORMAL LOW (ref 15–41)
Albumin: 3.7 g/dL (ref 3.5–5.0)
Alkaline Phosphatase: 169 U/L — ABNORMAL HIGH (ref 38–126)
Anion gap: 12 (ref 5–15)
BUN: 19 mg/dL (ref 8–23)
CO2: 23 mmol/L (ref 22–32)
Calcium: 10.3 mg/dL (ref 8.9–10.3)
Chloride: 108 mmol/L (ref 98–111)
Creatinine, Ser: 1.11 mg/dL — ABNORMAL HIGH (ref 0.44–1.00)
GFR calc Af Amer: 59 mL/min — ABNORMAL LOW (ref >60)
GFR calc non Af Amer: 51 mL/min — ABNORMAL LOW (ref >60)
Glucose, Bld: 104 mg/dL — ABNORMAL HIGH (ref 70–99)
Potassium: 4.6 mmol/L (ref 3.5–5.1)
Sodium: 143 mmol/L (ref 135–145)
Total Bilirubin: 1.4 mg/dL — ABNORMAL HIGH (ref 0.3–1.2)
Total Protein: 7.9 g/dL (ref 6.5–8.1)

## 2020-03-08 LAB — CBC WITH DIFFERENTIAL/PLATELET
Abs Immature Granulocytes: 0.01 10*3/uL (ref 0.00–0.07)
Basophils Absolute: 0 10*3/uL (ref 0.0–0.1)
Basophils Relative: 1 %
Eosinophils Absolute: 0.2 10*3/uL (ref 0.0–0.5)
Eosinophils Relative: 3 %
HCT: 37.6 % (ref 36.0–46.0)
Hemoglobin: 12.3 g/dL (ref 12.0–15.0)
Immature Granulocytes: 0 %
Lymphocytes Relative: 33 %
Lymphs Abs: 2 10*3/uL (ref 0.7–4.0)
MCH: 30.3 pg (ref 26.0–34.0)
MCHC: 32.7 g/dL (ref 30.0–36.0)
MCV: 92.6 fL (ref 80.0–100.0)
Monocytes Absolute: 0.4 10*3/uL (ref 0.1–1.0)
Monocytes Relative: 8 %
Neutro Abs: 3.3 10*3/uL (ref 1.7–7.7)
Neutrophils Relative %: 55 %
Platelets: 299 10*3/uL (ref 150–400)
RBC: 4.06 MIL/uL (ref 3.87–5.11)
RDW: 12.2 % (ref 11.5–15.5)
WBC: 5.8 10*3/uL (ref 4.0–10.5)
nRBC: 0 % (ref 0.0–0.2)

## 2020-03-08 MED ORDER — FULVESTRANT 250 MG/5ML IM SOLN
500.0000 mg | Freq: Once | INTRAMUSCULAR | Status: AC
  Administered 2020-03-08: 500 mg via INTRAMUSCULAR

## 2020-03-08 MED ORDER — FULVESTRANT 250 MG/5ML IM SOLN
INTRAMUSCULAR | Status: AC
  Filled 2020-03-08: qty 10

## 2020-03-08 NOTE — Progress Notes (Addendum)
[place]  Telephone:([phone] Fax:([phone]) [phone]     ID: Bryce Kimble DOB: 03-22-1951  MR#: [id number]  HOZ#:[id number]  Patient Care Team: Glendale Chard, MD as PCP - General (Internal Medicine) Magrinat, Virgie Dad, MD as Consulting Physician (Oncology) Rolm Bookbinder, MD as Consulting Physician (General Surgery) Gery Pray, MD as Consulting Physician (Radiation Oncology) [name], MD as Consulting Physician (Ophthalmology)   CHIEF COMPLAINT: triple negative breast cancer; PALB2 positive  CURRENT TREATMENT: CMF to start in July  INTERVAL HISTORY: Cumi returns today for evaluation of her now-recurrent left breast cancer.  She underwent a left mastectomy on 02/14/2020 and is recovering from her surgery.    Her mastectomy showed a 2.3 cm invasive ductal carcinoma grade 3, no lymph nodes were biopsied.  She had her drains removed and is healing well.  She notes some intermittent pain from the last drain that was removed.  She is taking Tylenol or Advil for the pain.  She has stopped taking Oxycodone.    She started Fulvestrant two weeks ago and had good tolerance other than some mild injection site soreness.    Since her last visit, we received a copy of her Oncotype results.  Her score was 60, predicting a benefit from adjuvant chemotherapy.  It also reads the tumor as triple negative!  REVIEW OF SYSTEMS: Panayiota has remained active and feeling well.  She denies any new issues.  She continues to walk through her house with a walker.  She is without fever, shills, chest pain, palpitations, cough, bowel/bladder changes, headaches, vision issues, nausea, or vomiting.  A detailed ROS was otherwise non contributory.    BREAST CANCER HISTORY: From the original intake note:  Avaline herself palpated a mass in her left breast, and immediately brought it to the attention of her primary care physician, Dr. Baird Cancer, who confirmed the finding and setup the  patient for bilateral diagnostic mammography at the [facility] Center 02/20/2014. This showed a high density mass in the outer left breast measuring up to 3.7 cm. It corresponded to the site of palpable concern. In addition the normal 4 logically abnormal lymph nodes in the left axilla. The mass was palpable by exam, and by ultrasonography measured 2.6 cm. There were enlarged and morphologically abnormal lymph nodes in the left axilla, largest measuring 3 cm.  Biopsy of the left breast mass in question and 1 of the abnormal axillary lymph nodes 02/22/2014 showed ([id number]) biopsies to be positive for an invasive ductal carcinoma, grade 3, triple negative, with an MIB-1 of 90%. Specifically the HER-2 signals ratio was 1.05, and the number per cell 2.00.  MRI of the breasts 03/01/2014 showed a 3.8 cm enhancing mass with areas of apparent necrosis in the left breast, as well as the biopsy tract extending laterally, the combined measure 5.3 cm. There were no other masses or areas of abnormal enhancement. There were multiple abnormally enlarged left axillary lymph nodes with loss of the normal fatty hilum. These included level I and retropectoral lymph nodes.  The patient's subsequent history is as detailed below   PAST MEDICAL HISTORY: Past Medical History:  Diagnosis Date  . Colon cancer ([place])   . Hx of rotator cuff surgery   . Hypertension   . left breast cancer   . Personal history of chemotherapy 2016   left  . Personal history of radiation therapy 2016   left breast   . Radiation 11/15/14-01/02/15   left breast, axillary and supraclavicular region 45 gray,  lumpectomy cavity boosted to 16 gray    PAST SURGICAL HISTORY: Past Surgical History:  Procedure Laterality Date  . ABDOMINAL HYSTERECTOMY  1988  . BREAST EXCISIONAL BIOPSY Right 12/21/2017  . BREAST LUMPECTOMY Left 2016  . COLON SURGERY  1988   colectomy/colostomy-ca  . COLON SURGERY  1988   colostomy takedown-reversal  .  COLONOSCOPY    . EXCISION / BIOPSY BREAST / NIPPLE / DUCT Left 09/20/2014  . MASTECTOMY Left 02/14/2020  . MASTECTOMY W/ SENTINEL NODE BIOPSY Left 02/14/2020   Procedure: LEFT MASTECTOMY WITH BLUE DYE INJECTION;  Surgeon: Rolm Bookbinder, MD;  Location: [facility];  Service: General;  Laterality: Left;  PEC BLOCK  . RADIOACTIVE SEED GUIDED EXCISIONAL BREAST BIOPSY Right 12/21/2017   Procedure: RIGHT RADIOACTIVE SEED GUIDED EXCISIONAL BREAST BIOPSY ERAS PATHWAY;  Surgeon: Rolm Bookbinder, MD;  Location: [facility];  Service: General;  Laterality: Right;  . RADIOACTIVE SEED GUIDED PARTIAL MASTECTOMY WITH AXILLARY SENTINEL LYMPH NODE BIOPSY Left 09/20/2014   Procedure: RADIOACTIVE SEED GUIDED LEFT BREAST LUMPECTOMY WITH AXILLARY SENTINEL LYMPH NODE BIOPSY;  Surgeon: Rolm Bookbinder, MD;  Location: [facility];  Service: General;  Laterality: Left;  . TOTAL SHOULDER ARTHROPLASTY  2009   right    FAMILY HISTORY Family History  Problem Relation Age of Onset  . Cancer Mother 41       breast  . Breast cancer Mother        early 10s  . Stroke Father 59       cause of death  . Diabetes Father   . Cancer Maternal Aunt        breast cancer at unknown age  . Breast cancer Maternal Aunt   . Ovarian cancer Neg Hx    the patient's father died at the age of 2 following a stroke in the setting of diabetes; the patient's mother is living at 69. The patient has 3 brothers and 2 sisters. [name], [name], was diagnosed with breast cancer in her early 69s. There is no other breast or ovarian cancer in the family. The patient herself was diagnosed with watts by history he is an incidentally found stage I colon carcinoma noted at the time of her hysterectomy. She had a partial colectomy but no adjuvant treatment. We have no records regarding that procedure   GYNECOLOGIC HISTORY:  No LMP recorded. Patient has had a hysterectomy. Menarche age 59, first live birth age  5, the patient underwent total abdominal hysterectomy with bilateral salpingo-oophorectomy in her 51s. She did not take hormone replacement. She did not take oral contraceptives at any point.   SOCIAL HISTORY: (Updated May 2021 Elvenia worked for [employer] for about 40 years, retiring in 2008. She is divorced and lives alone, with no pets.  Her grandson who is a Conservator, museum/gallery frequently stays with her.  Her daughter Karess Harner works for [employer] in Press photographer. The second daughter, Lillyen Schow, works as a Scientist, water quality at [employer].  The patient has 6 grandchildren and three great-grandchildren. She attends a [employer]. (updated 09/28/2018)    ADVANCED DIRECTIVES: Not in place. The patient intends to name her daughter Marcello Fennel. her healthcare power of attorney. [name] is work number is [phone]. On the patient's 03/01/2014 visit she was given a copy of the appropriate documents to complete and notarize at her discretion.     HEALTH MAINTENANCE: Social History   Tobacco Use  . Smoking status: Never Smoker  . Smokeless tobacco:  Never Used  Vaping Use  . Vaping Use: Never used  Substance Use Topics  . Alcohol use: Not Currently    Comment: occ-rare  . Drug use: No     Colonoscopy: 2000  PAP: May 2015  Bone density: 12/21/2017, T-score of -0.6  Lipid panel: 02/03/2014  No Known Allergies  Current Outpatient Medications  Medication Sig Dispense Refill  . acetaminophen (TYLENOL) 500 MG tablet Take 1,000 mg by mouth every 6 (six) hours as needed for mild pain or moderate pain.    Marland Kitchen gabapentin (NEURONTIN) 300 MG capsule Take 1 capsule (300 mg total) by mouth at bedtime. 90 capsule 4  . hydroxypropyl methylcellulose / hypromellose (ISOPTO TEARS / GONIOVISC) 2.5 % ophthalmic solution Place 1 drop into both eyes daily as needed for dry eyes.    . Liniments (SALONPAS ARTHRITIS PAIN RELIEF EX) Apply 1 application topically daily as needed (knee pain).    Marland Kitchen  lisinopril-hydrochlorothiazide (ZESTORETIC) 20-12.5 MG tablet Take 1/2 tablet daily by mouth (Patient taking differently: Take 0.5 tablets by mouth 3 (three) times a week. ) 30 tablet 6  . Multiple Vitamins-Minerals (AIRBORNE PO) Take 1 tablet by mouth 2 (two) times a week.    . ursodiol (ACTIGALL) 500 MG tablet Take 500 mg by mouth 3 (three) times daily.      No current facility-administered medications for this visit.    OBJECTIVE:   Vitals:   03/08/20 1220  BP: (!) 121/46  Pulse: (!) 102  Resp: 16  Temp: 98.5 F (36.9 C)  SpO2: 100%    Body mass index is 40.53 kg/m.     ECOG FS:1 - Symptomatic but completely ambulatory GENERAL: Patient is a well appearing female in no acute distress HEENT:  Sclerae anicteric.  Mask in place. Neck is supple.  NODES:  No cervical, supraclavicular, or axillary lymphadenopathy palpated.  BREAST EXAM:  Left breast surgically absent, binder in place, steri strips covering, healing well, no sign of infection, gauze over previous drain site, clean dry and intact LUNGS:  Clear to auscultation bilaterally.  No wheezes or rhonchi. HEART:  Regular rate and rhythm. No murmur appreciated. ABDOMEN:  Soft, nontender.  Positive, normoactive bowel sounds. No organomegaly palpated. MSK:  No focal spinal tenderness to palpation. Full range of motion bilaterally in the upper extremities. EXTREMITIES:  No peripheral edema.   SKIN:  Clear with no obvious rashes or skin changes. No nail dyscrasia. NEURO:  Nonfocal. Well oriented.  Appropriate affect.     LAB RESULTS:  No results found for: SPEP  Lab Results  Component Value Date   WBC 5.8 03/08/2020   NEUTROABS 3.3 03/08/2020   HGB 12.3 03/08/2020   HCT 37.6 03/08/2020   MCV 92.6 03/08/2020   PLT 299 03/08/2020      Chemistry      Component Value Date/Time   NA 143 03/08/2020 1201   NA 143 12/07/2019 1742   NA 142 11/13/2016 1050   K 4.6 03/08/2020 1201   K 4.1 11/13/2016 1050   CL 108 03/08/2020  1201   CO2 23 03/08/2020 1201   CO2 27 11/13/2016 1050   BUN 19 03/08/2020 1201   BUN 13 12/07/2019 1742   BUN 11.5 11/13/2016 1050   CREATININE 1.11 (H) 03/08/2020 1201   CREATININE 0.86 01/30/2020 0946   CREATININE 1.0 11/13/2016 1050      Component Value Date/Time   CALCIUM 10.3 03/08/2020 1201   CALCIUM 9.8 11/13/2016 1050   ALKPHOS 169 (H) 03/08/2020 1201  ALKPHOS 140 11/13/2016 1050   AST 14 (L) 03/08/2020 1201   AST 12 (L) 01/30/2020 0946   AST 13 11/13/2016 1050   ALT 22 03/08/2020 1201   ALT 14 01/30/2020 0946   ALT 15 11/13/2016 1050   BILITOT 1.4 (H) 03/08/2020 1201   BILITOT 1.3 (H) 01/30/2020 0946   BILITOT 0.86 11/13/2016 1050       No results found for: LABCA2  No components found for: [id number]  No results for input(s): INR in the last 168 hours.  Urinalysis    Component Value Date/Time   COLORURINE YELLOW 04/05/2008 1120   APPEARANCEUR CLEAR 04/05/2008 1120   LABSPEC 1.021 04/05/2008 1120   PHURINE 6.0 04/05/2008 1120   GLUCOSEU NEGATIVE 04/05/2008 1120   HGBUR NEGATIVE 04/05/2008 1120   BILIRUBINUR negative 12/07/2019 1212   KETONESUR NEGATIVE 04/05/2008 1120   PROTEINUR Negative 12/07/2019 1212   PROTEINUR NEGATIVE 04/05/2008 1120   UROBILINOGEN 1.0 12/07/2019 1212   UROBILINOGEN 0.2 04/05/2008 1120   NITRITE negative 12/07/2019 1212   NITRITE NEGATIVE 04/05/2008 1120   LEUKOCYTESUR Negative 12/07/2019 1212    STUDIES: CT Chest W Contrast  Result Date: 02/08/2020 CLINICAL DATA:  Breast cancer. Restaging. History of previous breast cancer with neoadjuvant therapy followed by lumpectomy and adjuvant chemo radiation. New diagnosis in the LEFT breast. EXAM: CT CHEST WITH CONTRAST TECHNIQUE: Multidetector CT imaging of the chest was performed during intravenous contrast administration. CONTRAST:  19m OMNIPAQUE IOHEXOL 300 MG/ML  SOLN COMPARISON:  04/24/2016 FINDINGS: Cardiovascular: Aortic caliber and contour is normal. No pericardial effusion.  Normal appearance of central pulmonary vasculature. Limited assessment on venous phase imaging. Heart size is normal. Mediastinum/Nodes: Enlarged heterogeneous thyroid similar to prior exams previously evaluated with ultrasound. No axillary lymphadenopathy with signs of prior LEFT axillary dissection/nodal biopsy. Lymph node in the RIGHT axilla retains a fatty hilum measuring approximately 9 mm. Scattered RIGHT subpectoral lymph nodes are similar to the prior study. No internal mammary nodal enlargement. No mediastinal adenopathy or hilar adenopathy. Lungs/Pleura: Tiny RIGHT lung nodule in the RIGHT upper lobe (image 42, series 5) 4 mm unchanged from 2017. Subtle nodularity along the subpleural lung in the LEFT chest with similar appearance from prior radiotherapy, unchanged. Small RIGHT basilar nodule (image 111, series 5) 6 mm unchanged since 2016. Airways are patent. No consolidation. No pleural effusion. Upper Abdomen: Incidental imaging of upper abdominal contents with lobular hepatic contours and suggestion of hepatic steatosis. Liver is incompletely imaged. Adrenal glands are normal. Spleen is normal size. Musculoskeletal: Signs of LEFT breast lumpectomy. More lobular appearance of this area and increased soft tissue since previous imaging best exhibited on image 54 series 2, of uncertain significance on CT but in an area reportedly positive for neoplasm. No acute or destructive bone process. Marked glenohumeral degenerative changes on the LEFT and changes of RIGHT shoulder arthroplasty. IMPRESSION: 1. Increasing soft tissue in the area of prior lumpectomy likely reflects tumor associated with this area, now biopsy-proven. 2. Stable small pulmonary nodules. 3. Hepatic steatosis. 4. Enlarged heterogeneous thyroid similar to prior exams previously evaluated with ultrasound. Electronically Signed   By: GZetta BillsM.D.   On: 02/08/2020 14:07   NM Bone Scan Whole Body  Result Date: 02/08/2020 CLINICAL  DATA:  Breast cancer, staging EXAM: NUCLEAR MEDICINE WHOLE BODY BONE SCAN TECHNIQUE: Whole body anterior and posterior images were obtained approximately 3 hours after intravenous injection of radiopharmaceutical. RADIOPHARMACEUTICALS:  21 mCi Technetium-97mDP IV COMPARISON:  None FINDINGS: Uptake at shoulders, sternoclavicular joints,  wrists, knees, and feet, typically degenerative. Uptake at BILATERAL aspect of lower lumbar spine at L5-S1, likely degenerative. No definite worrisome sites of tracer uptake identified to suggest osseous metastatic disease. Asymmetric uptake of tracer at RIGHT breast, nonspecific. Otherwise expected urinary tract and soft tissue distribution of tracer. IMPRESSION: Scattered degenerative type uptake. No definite scintigraphic evidence of osseous metastatic disease. Electronically Signed   By: Lavonia Dana M.D.   On: 02/08/2020 17:38   NM Sentinel Node Inj-No Rpt (Breast)  Result Date: 02/14/2020 Sulfur colloid was injected by the nuclear medicine technologist for melanoma sentinel node.      ASSESSMENT: 69 y.o. BRCA negative [place] woman s/p left breast upper outer quadrant and left axillary lymph node biopsy 02/22/2014, both positive for a clinical T2 N2, stage IIIA invasive ductal carcinoma, grade 3, triple negative, with an MIB-1 of 90%  (1) neoadjuvant chemotherapy started 03/13/2014, with cyclophosphamide and doxorubicin in dose dense fashion x4, with Neulasta support on day 2, completed 04/24/2014, followed by weekly carboplatin and paclitaxel x12, paclitaxel reduced during cycle 5 and cycle 7 because of elevated bilirubin levels  (2) left lumpectomy and sentinel lymph node sampling 09/20/2014 showed a complete pathologic response.  (3) radiation completed April 2016  (4) remote history of partial colectomy for early stage colon cancer incidentally found during TAH/BSO in the 1980s  (5) genetics testing since 04/03/2014, demonstrated a pathogenic mutation in  the PALB2 gene  (a) other genes tested through the [employer] gene panel, [employer], showed no deleterious mutations.  (b) PALB2 mutations are known to increase the risk of breast and pancreatic cancer, possibly other cancers, but the data is preliminary  (c) the patient's 2 daughters have been tested and did not carry the gene  (d) [place]'s MSH2 VUS has been reclassified as "likely benign"  (e) she is status post remote TAH/BSO  (f) intensified screening, with mammography in February and breast MRI in August planned  (6) staging studies showed right-sided lung nodules, too small to characterize, and a dominant right-sided thyroid nodule unchanged in size as compared to 2011; to be followed  (a) chest CT 04/24/2016 showed the small pulmonary nodules to be completely stable, consistent with benign findings.  (b) thyroid ultrasound--done on 07/22/2018 showed enlarged heterogeneous lobular and multinodular thyroid gland, TI-RADS category 3 nodules, not meeting criteria for further biopsy or dedicated imaging f/u.    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  (8) anastrozole started 07/08/2018 for breast cancer prevention, discontinued 01/30/2020 with disease recurrence  (a) bone density scan 01/01/2018 normal, T score -0.6  (9) RECURRENT DISEASE May 2021  (a) left breast overlapping biopsy 01/20/2020 shows a clinical  T2 N0, stage IIB invasive ductal carcinoma, grade 2 or 3, with moderate estrogen receptor positivity, progesterone receptor negative, HER-2 not amplified, MIB-1 of 80%  (b) Left Mastectomy on 02/14/2020: IDC, grade 3, 2.3 cm, margins negative, T2, NX.   (c) CT of the chest and bone scan on 02/08/2020 shows no distant metastases  (10) to start fulvestrant and palbociclib 02/24/2020 discontinued on 03/08/2020  (a) Oncotype sent on surgical sample shows score of 60  indicating a greater than 29% risk of recurrence on antiestrogens alone, and also predicting a significant benefit from chemotherapy.  (b) the Oncotype test found this tumor to be triple negative by single gene scores.  (c) CMF to start on 03/27/2020  PLAN:  Lorann is doing well  today.  She was started on the Fulvestrant injections and has tolerated these quite well.  Her drain has been removed and her surgical site is healing well.  Her activity has remained stable.    Yulianna met with Dr. Jana Hakim today to discuss her oncotype results which were high at 64, indicating that she needs to receive chemotherapy.  Since she has previously received chemotherapy with doxorubicin and cyclophosphamide, along with Paclitaxel and Carboplatin, she should receive a different chemotherapy.  She was recommended to start CMF chemotherapy.  Dr. Jana Hakim reviewed that this chemotherapy is given once every 3 weeks by vein.  It is composed of cyclophosphamide, methotrexate and fluorouracil.  This is given for 8 cycles and common side effects include mild nausea, mild lab changes, mild fatigue.  She will not lose her hair.    Oveta understands this and understands she will need port placement.  We have asked Dr. [name] or one of his colleagues to accomplish this for [place].  [place] will return as noted above.  She has a good understanding of the above.   She knows to call for any questions that may arise between now and her next appointment.  We are happy to see her sooner if needed.    Total encounter time 35 minutes.Wilber Bihari, NP 03/08/20 6:40 PM Medical Oncology and Hematology [facility] [place], [place] [address] Tel. [phone]    Fax. [phone]   ADDENDUM: I reviewed results of the Oncotype with Vaughan Basta.  These are surprising because they show no estrogen or progesterone receptor activity (or HER-2 activity, and the sample sent.  Given this reading I think we have to  move to chemotherapy.  We then discussed the fact that she has already received first-line agents for her initial chemo treatment.  We cannot return doxorubicin and I am not sure how well she would tolerate Taxotere.  I do think she will be able to tolerate CMF chemotherapy and we went into the possible toxicities side effects and complications of these agents.  She is agreeable to proceeding.  Target start date is March 27, 2020 assuming we can get a port in before then.  We will see her on day 1 and day 8 of her first cycle and she will also come to chemotherapy school.  I personally saw this patient and performed a substantive portion of this encounter with the listed APP documented above.   Chauncey Cruel, MD Medical Oncology and Hematology [facility] [address] [place], [place] [address] Tel. [phone]    Fax. [phone]     *Total Encounter Time as defined by the Centers for Medicare and Medicaid Services includes, in addition to the face-to-face time of a patient visit (documented in the note above) non-face-to-face time: obtaining and reviewing outside history, ordering and reviewing medications, tests or procedures, care coordination (communications with other health care professionals or caregivers) and documentation in the medical record.

## 2020-03-08 NOTE — Patient Instructions (Signed)
Fulvestrant injection What is this medicine? FULVESTRANT (ful VES trant) blocks the effects of estrogen. It is used to treat breast cancer. This medicine may be used for other purposes; ask your health care provider or pharmacist if you have questions. COMMON BRAND NAME(S): FASLODEX What should I tell my health care provider before I take this medicine? They need to know if you have any of these conditions:  bleeding disorders  liver disease  low blood counts, like low white cell, platelet, or red cell counts  an unusual or allergic reaction to fulvestrant, other medicines, foods, dyes, or preservatives  pregnant or trying to get pregnant  breast-feeding How should I use this medicine? This medicine is for injection into a muscle. It is usually given by a health care professional in a hospital or clinic setting. Talk to your pediatrician regarding the use of this medicine in children. Special care may be needed. Overdosage: If you think you have taken too much of this medicine contact a poison control center or emergency room at once. NOTE: This medicine is only for you. Do not share this medicine with others. What if I miss a dose? It is important not to miss your dose. Call your doctor or health care professional if you are unable to keep an appointment. What may interact with this medicine?  medicines that treat or prevent blood clots like warfarin, enoxaparin, dalteparin, apixaban, dabigatran, and rivaroxaban This list may not describe all possible interactions. Give your health care provider a list of all the medicines, herbs, non-prescription drugs, or dietary supplements you use. Also tell them if you smoke, drink alcohol, or use illegal drugs. Some items may interact with your medicine. What should I watch for while using this medicine? Your condition will be monitored carefully while you are receiving this medicine. You will need important blood work done while you are taking  this medicine. Do not become pregnant while taking this medicine or for at least 1 year after stopping it. Women of child-bearing potential will need to have a negative pregnancy test before starting this medicine. Women should inform their doctor if they wish to become pregnant or think they might be pregnant. There is a potential for serious side effects to an unborn child. Men should inform their doctors if they wish to father a child. This medicine may lower sperm counts. Talk to your health care professional or pharmacist for more information. Do not breast-feed an infant while taking this medicine or for 1 year after the last dose. What side effects may I notice from receiving this medicine? Side effects that you should report to your doctor or health care professional as soon as possible:  allergic reactions like skin rash, itching or hives, swelling of the face, lips, or tongue  feeling faint or lightheaded, falls  pain, tingling, numbness, or weakness in the legs  signs and symptoms of infection like fever or chills; cough; flu-like symptoms; sore throat  vaginal bleeding Side effects that usually do not require medical attention (report to your doctor or health care professional if they continue or are bothersome):  aches, pains  constipation  diarrhea  headache  hot flashes  nausea, vomiting  pain at site where injected  stomach pain This list may not describe all possible side effects. Call your doctor for medical advice about side effects. You may report side effects to FDA at 1-800-FDA-1088. Where should I keep my medicine? This drug is given in a hospital or clinic and will   not be stored at home. NOTE: This sheet is a summary. It may not cover all possible information. If you have questions about this medicine, talk to your doctor, pharmacist, or health care provider.  2020 Elsevier/Gold Standard (2017-12-10 11:34:41)  

## 2020-03-08 NOTE — Progress Notes (Signed)
START OFF PATHWAY REGIMEN - Breast   [id number]:CMF (IV cyclophosphamide) q21 days:   A cycle is every 21 days:     Cyclophosphamide      Methotrexate      Fluorouracil   **Always confirm dose/schedule in your pharmacy ordering system**  Patient Characteristics: Postoperative without Neoadjuvant Therapy (Pathologic Staging), Invasive Disease, Adjuvant Therapy, HER2 Negative/Unknown/Equivocal, ER Negative/Unknown, Node Negative, pT1a-c, N65m or pT1c or Higher, pN0 Therapeutic Status: Postoperative without Neoadjuvant Therapy (Pathologic Staging) AJCC Grade: G3 AJCC N Category: pN0 AJCC M Category: cM0 ER Status: Negative (-) AJCC 8 Stage Grouping: IIA HER2 Status: Negative (-) Oncotype Dx Recurrence Score: Not Appropriate AJCC T Category: pT2 PR Status: Negative (-) Intent of Therapy: Curative Intent, Discussed with Patient

## 2020-03-09 ENCOUNTER — Telehealth: Payer: Self-pay | Admitting: Oncology

## 2020-03-09 NOTE — Telephone Encounter (Signed)
Scheduled appts per 6/24 los. Pt to get updated appt calendar per next appt notes.

## 2020-03-12 ENCOUNTER — Other Ambulatory Visit: Payer: Self-pay

## 2020-03-12 ENCOUNTER — Ambulatory Visit: Payer: PPO | Admitting: Physical Therapy

## 2020-03-12 DIAGNOSIS — M79622 Pain in left upper arm: Secondary | ICD-10-CM

## 2020-03-12 DIAGNOSIS — R600 Localized edema: Secondary | ICD-10-CM

## 2020-03-12 DIAGNOSIS — M25512 Pain in left shoulder: Secondary | ICD-10-CM

## 2020-03-12 DIAGNOSIS — C50912 Malignant neoplasm of unspecified site of left female breast: Secondary | ICD-10-CM | POA: Diagnosis not present

## 2020-03-12 DIAGNOSIS — Z483 Aftercare following surgery for neoplasm: Secondary | ICD-10-CM

## 2020-03-12 DIAGNOSIS — M25612 Stiffness of left shoulder, not elsewhere classified: Secondary | ICD-10-CM

## 2020-03-12 NOTE — Therapy (Signed)
[place], [place], [address] Phone: [phone]   Fax:  [phone]  Physical Therapy Treatment  Patient Details  Name: [name] MRN: [id number] Date of Birth: Sep 24, 1950 Referring Provider (PT): Rolm Bookbinder MD   Encounter Date: 03/12/2020   PT End of Session - 03/12/20 1151    Visit Number 3    Number of Visits 11    PT Start Time 1100    PT Stop Time 1145    PT Time Calculation (min) 45 min    Activity Tolerance Patient tolerated treatment well    Behavior During Therapy [facility] for tasks assessed/performed           Past Medical History:  Diagnosis Date  . Colon cancer ([place])   . Hx of rotator cuff surgery   . Hypertension   . left breast cancer   . Personal history of chemotherapy 2016   left  . Personal history of radiation therapy 2016   left breast   . Radiation 11/15/14-01/02/15   left breast, axillary and supraclavicular region 45 gray, lumpectomy cavity boosted to 16 gray    Past Surgical History:  Procedure Laterality Date  . ABDOMINAL HYSTERECTOMY  1988  . BREAST EXCISIONAL BIOPSY Right 12/21/2017  . BREAST LUMPECTOMY Left 2016  . COLON SURGERY  1988   colectomy/colostomy-ca  . COLON SURGERY  1988   colostomy takedown-reversal  . COLONOSCOPY    . EXCISION / BIOPSY BREAST / NIPPLE / DUCT Left 09/20/2014  . MASTECTOMY Left 02/14/2020  . MASTECTOMY W/ SENTINEL NODE BIOPSY Left 02/14/2020   Procedure: LEFT MASTECTOMY WITH BLUE DYE INJECTION;  Surgeon: Rolm Bookbinder, MD;  Location: [place];  Service: General;  Laterality: Left;  PEC BLOCK  . RADIOACTIVE SEED GUIDED EXCISIONAL BREAST BIOPSY Right 12/21/2017   Procedure: RIGHT RADIOACTIVE SEED GUIDED EXCISIONAL BREAST BIOPSY ERAS PATHWAY;  Surgeon: Rolm Bookbinder, MD;  Location: [place];  Service: General;  Laterality: Right;  . RADIOACTIVE SEED GUIDED PARTIAL MASTECTOMY WITH AXILLARY SENTINEL  LYMPH NODE BIOPSY Left 09/20/2014   Procedure: RADIOACTIVE SEED GUIDED LEFT BREAST LUMPECTOMY WITH AXILLARY SENTINEL LYMPH NODE BIOPSY;  Surgeon: Rolm Bookbinder, MD;  Location: [place];  Service: General;  Laterality: Left;  . TOTAL SHOULDER ARTHROPLASTY  2009   right    There were no vitals filed for this visit.   Subjective Assessment - 03/12/20 1101    Subjective "I"m doing pretty" She says she tries to exercise as much as she can. She has some tightness in her left axilla    Pertinent History Partial masectomy in the L breast in 2016 with SLNB, chemotherapy and radiation. Recent L masectomy with no lymph node removal 02/14/2020    Patient Stated Goals I want to get my arm working again and get the pain better.    Currently in Pain? No/denies                             [facility] Adult PT Treatment/Exercise - 03/12/20 0001      Exercises   Exercises Shoulder      Shoulder Exercises: Supine   Other Supine Exercises with her cane for AAROM:  scapular protraction x 10 reps. attempted shoulder flexion, but pt had toomuch pain and limitation in left axilla       Shoulder Exercises: Seated   Other Seated Exercises shoulder rolls with emphasis on scapular retraction  Shoulder Exercises: Sidelying   ABduction AROM;Left;10 reps   to 90 degrees    Other Sidelying Exercises small circles with hand pointed to ceiling       Shoulder Exercises: ROM/Strengthening   Other ROM/Strengthening Exercises manual resistance isometrics with short range ROM for abduction, flexion, extension and external rotation x 10 reps each       Manual Therapy   Manual Therapy Soft tissue mobilization;Myofascial release;Manual Lymphatic Drainage (MLD);Passive ROM    Soft tissue mobilization with light massage lotion to very tight pec major and posterior axillary muscles.     Myofascial Release to trigger points at Latt/teres major and pec major     Manual Lymphatic Drainage  (MLD) brief MLD to left axillo inguinal anastamosis to full areas at lateral chest     Passive ROM PROM multiple times with shoulder in various degress along with effort to decrease tigtness in pec major and posterior axillary muscles.    in supine, sidelying and sitting position                    PT Short Term Goals - 03/05/20 1036      PT SHORT TERM GOAL #1   Title Pt will be independent with HEP and self MLD within 2 weeks in order to promote autonomy of care.    Baseline Pt was provided with HEP today    Time 2    Period Weeks    Status New    Target Date 03/26/20             PT Long Term Goals - 03/05/20 1039      PT LONG TERM GOAL #1   Title Pt will improve R shoulder abduction, L shoulder flexion and abduction to 120 degrees within 5 weeks in order to demonstrate improved functional ROM.    Baseline R shoulder flexion: 132 abduction: 108 L shoulder flexion: 86 abduction: 76    Time 5    Period Weeks    Status New    Target Date 04/16/20      PT LONG TERM GOAL #2   Title Pt be measured for and receive appropriate compression garment to wear on a daily basis to manage edema at home.    Baseline currently pt has just regular bras with masectomy prosthesis in it.    Time 5    Period Weeks    Status New    Target Date 04/16/20      PT LONG TERM GOAL #3   Title Pt will improve DASH Score to 30% or less disaibility in order to demonstrate a functional improvement in the LUE.    Baseline 52.27                 Plan - 03/12/20 1152    Clinical Impression Statement Pt continues with marked tightness in left pec major muscle with decreased ROM of left shoulder especially in external rotation.  Focus today on manual work for soft tissue mobilization, brief manua lymph drainage around lateral chest and passive ROM in various positions with faciliation and joint positioning to alllow greater joing movement.  I question if she had problems with rotator cuff  weakness and tightness in pec major prior to recent surgery. Pt says she usually does everything with her right arm so she may have. Pt did report she felt looser after session.  Encouraged her to continue to use left arm and do shoulder rolls at home    Comorbidities  02/14/2020 L masectomy, 2016 partial L masectomy with 3 lymph node removal and radiation, lumpectomy of the R breast papilloma    Rehab Potential Good    PT Frequency 2x / week    PT Treatment/Interventions Therapeutic exercise;Therapeutic activities;Neuromuscular re-education;Manual techniques    PT Next Visit Plan P/ROM, consider isometrics/ use of UE ranger for more AROM monitoring scapula for good mobility. continue easy myofascial release STM as needed, teach self MLD    Consulted and Agree with Plan of Care Patient           Patient will benefit from skilled therapeutic intervention in order to improve the following deficits and impairments:  Increased edema, Increased fascial restricitons, Decreased range of motion, Decreased knowledge of precautions, Pain  Visit Diagnosis: Acute pain of left shoulder  Stiffness of left shoulder, not elsewhere classified  Localized edema  Left axillary pain  Aftercare following surgery for neoplasm     Problem List Patient Active Problem List   Diagnosis Date Noted  . Recurrent cancer of left breast ([place]) 02/14/2020  . Recurrent breast adenocarcinoma ([place]) 01/30/2020  . Essential hypertension 12/07/2019  . Dyspnea on exertion 11/11/2018  . Right leg pain 11/11/2018  . Obesity, morbid, BMI 40.0-49.9 ([place]) 10/11/2015  . 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 Genetic testing 05/24/2015  . Elevated LFTs 03/08/2015  . Anemia in neoplastic disease 06/12/2014  . Hypokalemia 05/29/2014  . Watery eyes 05/09/2014  . Malignant neoplasm of colon ([place]) 04/03/2014  . Family history of malignant neoplasm of breast 04/03/2014  . Malignant neoplasm of upper-outer quadrant of  left breast in female, estrogen receptor negative ([place]) 03/01/2014   Donato Heinz. Owens Shark PT  Norwood Levo 03/12/2020, 11:59 AM  [place], [place], [address] Phone: [phone]   Fax:  [phone]  Name: [name] MRN: [id number] Date of Birth: [birth year]

## 2020-03-13 ENCOUNTER — Other Ambulatory Visit (HOSPITAL_COMMUNITY): Payer: Self-pay | Admitting: Interventional Radiology

## 2020-03-14 ENCOUNTER — Other Ambulatory Visit: Payer: Self-pay

## 2020-03-14 ENCOUNTER — Ambulatory Visit: Payer: PPO

## 2020-03-14 DIAGNOSIS — M25512 Pain in left shoulder: Secondary | ICD-10-CM

## 2020-03-14 DIAGNOSIS — C50912 Malignant neoplasm of unspecified site of left female breast: Secondary | ICD-10-CM | POA: Diagnosis not present

## 2020-03-14 DIAGNOSIS — M79622 Pain in left upper arm: Secondary | ICD-10-CM

## 2020-03-14 DIAGNOSIS — M25612 Stiffness of left shoulder, not elsewhere classified: Secondary | ICD-10-CM

## 2020-03-14 DIAGNOSIS — Z483 Aftercare following surgery for neoplasm: Secondary | ICD-10-CM

## 2020-03-14 DIAGNOSIS — R600 Localized edema: Secondary | ICD-10-CM

## 2020-03-14 DIAGNOSIS — M25611 Stiffness of right shoulder, not elsewhere classified: Secondary | ICD-10-CM

## 2020-03-14 NOTE — Therapy (Signed)
[place], [place], [address] Phone: [phone]   Fax:  [phone]  Physical Therapy Treatment  Patient Details  Name: [name] MRN: [id number] Date of Birth: [birth year] Referring Provider (PT): Rolm Bookbinder MD   Encounter Date: 03/14/2020   PT End of Session - 03/14/20 0909    Visit Number 4    Number of Visits 11    Date for PT Re-Evaluation 04/16/20    PT Start Time 0906    PT Stop Time 1000    PT Time Calculation (min) 54 min    Activity Tolerance Patient tolerated treatment well    Behavior During Therapy [facility] for tasks assessed/performed           Past Medical History:  Diagnosis Date  . Colon cancer ([place])   . Hx of rotator cuff surgery   . Hypertension   . left breast cancer   . Personal history of chemotherapy 2016   left  . Personal history of radiation therapy 2016   left breast   . Radiation 11/15/14-01/02/15   left breast, axillary and supraclavicular region 45 gray, lumpectomy cavity boosted to 16 gray    Past Surgical History:  Procedure Laterality Date  . ABDOMINAL HYSTERECTOMY  1988  . BREAST EXCISIONAL BIOPSY Right 12/21/2017  . BREAST LUMPECTOMY Left 2016  . COLON SURGERY  1988   colectomy/colostomy-ca  . COLON SURGERY  1988   colostomy takedown-reversal  . COLONOSCOPY    . EXCISION / BIOPSY BREAST / NIPPLE / DUCT Left 09/20/2014  . MASTECTOMY Left 02/14/2020  . MASTECTOMY W/ SENTINEL NODE BIOPSY Left 02/14/2020   Procedure: LEFT MASTECTOMY WITH BLUE DYE INJECTION;  Surgeon: Rolm Bookbinder, MD;  Location: [place];  Service: General;  Laterality: Left;  PEC BLOCK  . RADIOACTIVE SEED GUIDED EXCISIONAL BREAST BIOPSY Right 12/21/2017   Procedure: RIGHT RADIOACTIVE SEED GUIDED EXCISIONAL BREAST BIOPSY ERAS PATHWAY;  Surgeon: Rolm Bookbinder, MD;  Location: [place];  Service: General;  Laterality: Right;  . RADIOACTIVE SEED GUIDED  PARTIAL MASTECTOMY WITH AXILLARY SENTINEL LYMPH NODE BIOPSY Left 09/20/2014   Procedure: RADIOACTIVE SEED GUIDED LEFT BREAST LUMPECTOMY WITH AXILLARY SENTINEL LYMPH NODE BIOPSY;  Surgeon: Rolm Bookbinder, MD;  Location: [place];  Service: General;  Laterality: Left;  . TOTAL SHOULDER ARTHROPLASTY  2009   right    There were no vitals filed for this visit.   Subjective Assessment - 03/14/20 0909    Subjective Pt reports that she had a good work ou at her last session. She states that she saw Dr. [name] and he pulled some fluid from her surgical side.    Pertinent History Partial masectomy in the L breast in 2016 with SLNB, chemotherapy and radiation. Recent L masectomy with no lymph node removal 02/14/2020    Patient Stated Goals I want to get my arm working again and get the pain better.    Currently in Pain? No/denies    Pain Score 0-No pain                             [facility] Adult PT Treatment/Exercise - 03/14/20 0001      Shoulder Exercises: Supine   Other Supine Exercises With dowel chest press 10x followed by chest press with flexion 10x wtih VC throughout to avoid pain with good movement into limited flexion.     Other Supine Exercises Supine external rotation  abduction with hands behind head 10x with VC to prevent pain with squeeze and stretch intermittently due to pt wincing/grimiacing initially. After VC she stated okay and had no signs/symptoms of pain with activity.       Shoulder Exercises: Sidelying   External Rotation AROM;Left;10 reps    External Rotation Limitations limited ROM pt was using alot of wrist extension due to compensation and limitations    ABduction AROM;Left;10 reps   to 90 degrees    Other Sidelying Exercises small circles with hand pointed to ceiling cc then cw pt reports more tightness going cw.       Shoulder Exercises: Standing   Other Standing Exercises Wall flexion and abduction slide with significant improvement  with ROM with increasing repetitions. pt compensates into scaption for abduction due to limitations in ROM.       Manual Therapy   Manual Therapy Soft tissue mobilization;Myofascial release;Manual Lymphatic Drainage (MLD);Passive ROM    Soft tissue mobilization Easy along the lateral border of the L pectoralis major with abduction 10x, easy STM to the L upper trapezius, lateral border of L pectoralis major avoiding incision and deltoid throughout with intermittent P/ROM     Myofascial Release along anterior chest wall avoiding incision with flexion and abduction 10x each    Passive ROM PROM 10x to pt end range, then repeated following STM then repeated 5x following brief rest and STM flexion/abduction, horizontal abduction, D2 flexion pattern and External rotation. pt was able to increase flexion and abduction after external rotation mobility improved                  PT Education - 03/14/20 0954    Education Details Pt will continue working on her current HEP. Discussed the possibility of muscle soreness tomorrow and she will perform her stretches but wait a day to perform exercises. She was educated to avoid tension on incision site and significant increases in pain.    Person(s) Educated Patient    Methods Explanation    Comprehension Verbalized understanding            PT Short Term Goals - 03/05/20 1036      PT SHORT TERM GOAL #1   Title Pt will be independent with HEP and self MLD within 2 weeks in order to promote autonomy of care.    Baseline Pt was provided with HEP today    Time 2    Period Weeks    Status New    Target Date 03/26/20             PT Long Term Goals - 03/05/20 1039      PT LONG TERM GOAL #1   Title Pt will improve R shoulder abduction, L shoulder flexion and abduction to 120 degrees within 5 weeks in order to demonstrate improved functional ROM.    Baseline R shoulder flexion: 132 abduction: 108 L shoulder flexion: 86 abduction: 76    Time 5      Period Weeks    Status New    Target Date 04/16/20      PT LONG TERM GOAL #2   Title Pt be measured for and receive appropriate compression garment to wear on a daily basis to manage edema at home.    Baseline currently pt has just regular bras with masectomy prosthesis in it.    Time 5    Period Weeks    Status New    Target Date 04/16/20  PT LONG TERM GOAL #3   Title Pt will improve DASH Score to 30% or less disaibility in order to demonstrate a functional improvement in the LUE.    Baseline 52.27                 Plan - 03/14/20 0909    Clinical Impression Statement Pt continues with significant tightness/stiffness at the L shoulder joint with decreased ROM at the L shoulder. P/ROM was performed this session first into flexion/abduction with very limited ROM; following P/ROM slow/easy with end range stretch ganing mobility into external rotation pt had significant improvement in flexion/abduction P/ROM. STM and myofascial release were performed today along the lateral pectoralis major/chest wall during movement to help decrease restrictions being careful to avoid incision area. Pt was able to tolerate an increase in AA/ROM and A/ROM activities without a significant increase in pain. Standing wall slide with significant compensation with abduction into scaption; goot ROM with flexion after the initial repetitions. Easy STM to the L deltoid/upper trapezius following standing exercises to help decrease muscle soreness/tightness. Pt will benefit from continued POC at this time.    Personal Factors and Comorbidities Comorbidity 2;Fitness    Comorbidities 02/14/2020 L masectomy, 2016 partial L masectomy with 3 lymph node removal and radiation, lumpectomy of the R breast papilloma    Stability/Clinical Decision Making Stable/Uncomplicated    Rehab Potential Good    PT Frequency 2x / week    PT Treatment/Interventions Therapeutic exercise;Therapeutic activities;Neuromuscular  re-education;Manual techniques    PT Next Visit Plan P/ROM, consider isometrics/ use of UE ranger for more AROM monitoring scapula for good mobility. continue easy myofascial release STM as needed, teach self MLD    PT Home Exercise Plan Access Code: [email or web address]    Consulted and Agree with Plan of Care Patient           Patient will benefit from skilled therapeutic intervention in order to improve the following deficits and impairments:  Increased edema, Increased fascial restricitons, Decreased range of motion, Decreased knowledge of precautions, Pain  Visit Diagnosis: Acute pain of left shoulder  Stiffness of left shoulder, not elsewhere classified  Localized edema  Left axillary pain  Aftercare following surgery for neoplasm  Recurrent cancer of left breast (H[facility])  Stiffness of right shoulder, not elsewhere classified     Problem List Patient Active Problem List   Diagnosis Date Noted  . Recurrent cancer of left breast ([place]) 02/14/2020  . Recurrent breast adenocarcinoma ([place]) 01/30/2020  . Essential hypertension 12/07/2019  . Dyspnea on exertion 11/11/2018  . Right leg pain 11/11/2018  . Obesity, morbid, BMI 40.0-49.9 ([place]) 10/11/2015  . 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 Genetic testing 05/24/2015  . Elevated LFTs 03/08/2015  . Anemia in neoplastic disease 06/12/2014  . Hypokalemia 05/29/2014  . Watery eyes 05/09/2014  . Malignant neoplasm of colon ([place]) 04/03/2014  . Family history of malignant neoplasm of breast 04/03/2014  . Malignant neoplasm of upper-outer quadrant of left breast in female, estrogen receptor negative ([place]) 03/01/2014    Ander Purpura, PT 03/14/2020, 10:04 AM  [place], [place], [address] Phone: [phone]   Fax:  [phone]  Name: Detrice Cales MRN: [id number] Date of Birth: 1950-12-14

## 2020-03-15 ENCOUNTER — Telehealth: Payer: Self-pay | Admitting: *Deleted

## 2020-03-15 ENCOUNTER — Other Ambulatory Visit (HOSPITAL_COMMUNITY): Payer: Self-pay | Admitting: General Surgery

## 2020-03-15 DIAGNOSIS — K760 Fatty (change of) liver, not elsewhere classified: Secondary | ICD-10-CM | POA: Diagnosis not present

## 2020-03-15 DIAGNOSIS — C50912 Malignant neoplasm of unspecified site of left female breast: Secondary | ICD-10-CM

## 2020-03-15 DIAGNOSIS — K743 Primary biliary cirrhosis: Secondary | ICD-10-CM | POA: Diagnosis not present

## 2020-03-15 DIAGNOSIS — R748 Abnormal levels of other serum enzymes: Secondary | ICD-10-CM | POA: Diagnosis not present

## 2020-03-15 NOTE — Telephone Encounter (Signed)
It is scheduled for 7/8.

## 2020-03-15 NOTE — Telephone Encounter (Signed)
Can someone reach out to patient and update her?

## 2020-03-15 NOTE — Telephone Encounter (Signed)
Message received from [facility] with [employer] at [phone]  stating pt is "waiting to hear about port placement for chemotherapy "  Pt is to start CMF July 13.  Pt was seen in this office on 6/24 and noted "[name] understands this and understands she will need port placement.  We have asked Dr. [name] or one of his colleagues to accomplish this for [place]."  This note will be sent to Dr [name] and LC/NP per above inquiry.

## 2020-03-16 ENCOUNTER — Telehealth: Payer: Self-pay

## 2020-03-16 NOTE — Telephone Encounter (Signed)
Patient called to clarify whether or not to begin [employer] instruction given are to hold she will be in next week and will discuss treatment plans

## 2020-03-20 ENCOUNTER — Ambulatory Visit: Payer: PPO | Attending: General Surgery

## 2020-03-20 ENCOUNTER — Other Ambulatory Visit: Payer: Self-pay

## 2020-03-20 DIAGNOSIS — R600 Localized edema: Secondary | ICD-10-CM | POA: Diagnosis not present

## 2020-03-20 DIAGNOSIS — M25612 Stiffness of left shoulder, not elsewhere classified: Secondary | ICD-10-CM | POA: Insufficient documentation

## 2020-03-20 DIAGNOSIS — Z483 Aftercare following surgery for neoplasm: Secondary | ICD-10-CM | POA: Diagnosis not present

## 2020-03-20 DIAGNOSIS — M79622 Pain in left upper arm: Secondary | ICD-10-CM | POA: Diagnosis not present

## 2020-03-20 DIAGNOSIS — C50912 Malignant neoplasm of unspecified site of left female breast: Secondary | ICD-10-CM

## 2020-03-20 DIAGNOSIS — M25611 Stiffness of right shoulder, not elsewhere classified: Secondary | ICD-10-CM | POA: Diagnosis not present

## 2020-03-20 DIAGNOSIS — M25512 Pain in left shoulder: Secondary | ICD-10-CM

## 2020-03-20 NOTE — Therapy (Signed)
[place], [place], [address] Phone: [phone]   Fax:  [phone]  Physical Therapy Treatment  Patient Details  Name: [name] MRN: [id number] Date of Birth: Feb 09, 1951 Referring Provider (PT): Rolm Bookbinder MD   Encounter Date: 03/20/2020   PT End of Session - 03/20/20 0908    Visit Number 5    Number of Visits 11    Date for PT Re-Evaluation 04/16/20    PT Start Time 0907    PT Stop Time 1000    PT Time Calculation (min) 53 min    Activity Tolerance Patient tolerated treatment well    Behavior During Therapy [facility] for tasks assessed/performed           Past Medical History:  Diagnosis Date  . Colon cancer ([place])   . Hx of rotator cuff surgery   . Hypertension   . left breast cancer   . Personal history of chemotherapy 2016   left  . Personal history of radiation therapy 2016   left breast   . Radiation 11/15/14-01/02/15   left breast, axillary and supraclavicular region 45 gray, lumpectomy cavity boosted to 16 gray    Past Surgical History:  Procedure Laterality Date  . ABDOMINAL HYSTERECTOMY  1988  . BREAST EXCISIONAL BIOPSY Right 12/21/2017  . BREAST LUMPECTOMY Left 2016  . COLON SURGERY  1988   colectomy/colostomy-ca  . COLON SURGERY  1988   colostomy takedown-reversal  . COLONOSCOPY    . EXCISION / BIOPSY BREAST / NIPPLE / DUCT Left 09/20/2014  . MASTECTOMY Left 02/14/2020  . MASTECTOMY W/ SENTINEL NODE BIOPSY Left 02/14/2020   Procedure: LEFT MASTECTOMY WITH BLUE DYE INJECTION;  Surgeon: Rolm Bookbinder, MD;  Location: [place];  Service: General;  Laterality: Left;  PEC BLOCK  . RADIOACTIVE SEED GUIDED EXCISIONAL BREAST BIOPSY Right 12/21/2017   Procedure: RIGHT RADIOACTIVE SEED GUIDED EXCISIONAL BREAST BIOPSY ERAS PATHWAY;  Surgeon: Rolm Bookbinder, MD;  Location: [place];  Service: General;  Laterality: Right;  . RADIOACTIVE SEED GUIDED PARTIAL  MASTECTOMY WITH AXILLARY SENTINEL LYMPH NODE BIOPSY Left 09/20/2014   Procedure: RADIOACTIVE SEED GUIDED LEFT BREAST LUMPECTOMY WITH AXILLARY SENTINEL LYMPH NODE BIOPSY;  Surgeon: Rolm Bookbinder, MD;  Location: [place];  Service: General;  Laterality: Left;  . TOTAL SHOULDER ARTHROPLASTY  2009   right    There were no vitals filed for this visit.   Subjective Assessment - 03/20/20 0909    Subjective Pt states that she was sore for a couple of days after her last session but she is feeling pretty good today. She states that she wouldn't stretch it at home so she is glad we are stretching it in the clinic.    Pertinent History Partial masectomy in the L breast in 2016 with SLNB, chemotherapy and radiation. Recent L masectomy with no lymph node removal 02/14/2020    Patient Stated Goals I want to get my arm working again and get the pain better.    Currently in Pain? No/denies    Pain Score 0-No pain                             [facility] Adult PT Treatment/Exercise - 03/20/20 0001      Exercises   Exercises Other Exercises    Other Exercises  Seated scapular retraction 10x with tactile cueing and VC for correct movement pt has very limited mobility,  Seated thoracic rotations with arms crossed over chest to help decrease stiffness at bil shoulder blades with VC to relax bil shoulders to prevent shoulder hiking bil she reports more stretch when rotatins R on the L then the other way, Isometric seated shoulder exercises with physical therapist providing resistance 10x 5 seconds flexion, extension, external and internal rotation.       Shoulder Exercises: Seated   Other Seated Exercises Shoulder ranger on the wall flexion 10x, then abduction 10x while seated in chair with movement into scaption due to stiffness/pain in the L shoulder with abduction. With shoulder ranger on the floor flexion 10x with tetany noted but no signs/symptoms of pain. Horizontal abduction 20x  in sitting with VC and demonstration for correct movement and tactile cueing at the L shoulder to prevent shoulder hiking.     Other Seated Exercises Seated on plinth table with yellow theraball on wedge on it's side for modification due to pt has difficulty standing shoulder flexion ball roll out 10x then in scaption moving wedge to scaption position and into horizontal aduction along wedge 10x for each direction. Then seated on plinth table ball roll out into abduction with trunk posterior rotation compensation with VC to rotate trunk forward on the L for stretch with ball roll out. 10x.       Shoulder Exercises: Pulleys   Flexion 2 minutes    Flexion Limitations VC to avoid pain    Scaption 2 minutes    Scaption Limitations tried abduction but pt ended up in scaption due to limitations. VC to avoid pain.       Manual Therapy   Manual Therapy Soft tissue mobilization;Passive ROM    Soft tissue mobilization Along the L upper trapezius, thoracic paraspinals/rhomboids, teres major/minor, infra/supraspinatus, deltoid and pectoralis major muscle easy long strokes along tight muscle bellies; minimal improvement by end of session.     Passive ROM Seated flexion, horizontal abduction, abduction with myofascial blocking on the L lateral trunk, and external rotation 10x each direction.                   PT Education - 03/20/20 1003    Education Details Pt will coninue working on her current HEP at home.    Person(s) Educated Patient    Methods Explanation    Comprehension Verbalized understanding            PT Short Term Goals - 03/05/20 1036      PT SHORT TERM GOAL #1   Title Pt will be independent with HEP and self MLD within 2 weeks in order to promote autonomy of care.    Baseline Pt was provided with HEP today    Time 2    Period Weeks    Status New    Target Date 03/26/20             PT Long Term Goals - 03/05/20 1039      PT LONG TERM GOAL #1   Title Pt will improve  R shoulder abduction, L shoulder flexion and abduction to 120 degrees within 5 weeks in order to demonstrate improved functional ROM.    Baseline R shoulder flexion: 132 abduction: 108 L shoulder flexion: 86 abduction: 76    Time 5    Period Weeks    Status New    Target Date 04/16/20      PT LONG TERM GOAL #2   Title Pt be measured for and receive appropriate compression garment to wear  on a daily basis to manage edema at home.    Baseline currently pt has just regular bras with masectomy prosthesis in it.    Time 5    Period Weeks    Status New    Target Date 04/16/20      PT LONG TERM GOAL #3   Title Pt will improve DASH Score to 30% or less disaibility in order to demonstrate a functional improvement in the LUE.    Baseline 52.27                 Plan - 03/20/20 0908    Clinical Impression Statement Pt continues with significant tightness/stiffness at the L shoulder with decreased ROM that presents as a possible rotator cuff issue along with decreased ROM from surgery. Increased AA/ROM activities performed this session with a focus on preventing compensation with trunk rotation and shoulder hiking. STM to the L shoulder/cervical area and scapular area performed today in sitting with minimal improvement by end of session with intermittent P/ROM with stabilization at the shoulder blade and shoulder to prevent compensation. isometric activities performed with signiicant weakness and increased symptoms with external rotation, weakness with internal rotation/extension and slight increase in pain pt reports relief and has no significant weakness with isometric flexion. Pt reports feeling loose by end of session. Pt will benefit from continued POC at this time.    Personal Factors and Comorbidities Comorbidity 2;Fitness    Comorbidities 02/14/2020 L masectomy, 2016 partial L masectomy with 3 lymph node removal and radiation, lumpectomy of the R breast papilloma    Rehab Potential Good     PT Frequency 2x / week    PT Treatment/Interventions Therapeutic exercise;Therapeutic activities;Neuromuscular re-education;Manual techniques    PT Next Visit Plan P/ROM, consider isometrics/ use of UE ranger for more AROM monitoring scapula for good mobility. continue easy myofascial release STM as needed, teach self MLD    PT Home Exercise Plan Access Code: [email or web address]    Consulted and Agree with Plan of Care Patient           Patient will benefit from skilled therapeutic intervention in order to improve the following deficits and impairments:  Increased edema, Increased fascial restricitons, Decreased range of motion, Decreased knowledge of precautions, Pain  Visit Diagnosis: Acute pain of left shoulder  Stiffness of left shoulder, not elsewhere classified  Localized edema  Left axillary pain  Aftercare following surgery for neoplasm  Recurrent cancer of left breast (HCC)  Stiffness of right shoulder, not elsewhere classified     Problem List Patient Active Problem List   Diagnosis Date Noted  . Recurrent cancer of left breast ([place]) 02/14/2020  . Recurrent breast adenocarcinoma ([place]) 01/30/2020  . Essential hypertension 12/07/2019  . Dyspnea on exertion 11/11/2018  . Right leg pain 11/11/2018  . Obesity, morbid, BMI 40.0-49.9 ([place]) 10/11/2015  . 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 Genetic testing 05/24/2015  . Elevated LFTs 03/08/2015  . Anemia in neoplastic disease 06/12/2014  . Hypokalemia 05/29/2014  . Watery eyes 05/09/2014  . Malignant neoplasm of colon ([place]) 04/03/2014  . Family history of malignant neoplasm of breast 04/03/2014  . Malignant neoplasm of upper-outer quadrant of left breast in female, estrogen receptor negative ([place]) 03/01/2014    Ander Purpura, PT 03/20/2020, 10:57 AM  [place], [place], [address] Phone: [phone]   Fax:  [phone]  Name:  [name] MRN: [id number] Date of Birth: 05/21/1951

## 2020-03-21 ENCOUNTER — Other Ambulatory Visit: Payer: Self-pay | Admitting: Student

## 2020-03-21 NOTE — Progress Notes (Signed)
Pharmacist Chemotherapy Monitoring - Initial Assessment    Anticipated start date: 03/27/20   Regimen:  . Are orders appropriate based on the patient's diagnosis, regimen, and cycle? Yes . Does the plan date match the patient's scheduled date? Yes . Is the sequencing of drugs appropriate? Yes . Are the premedications appropriate for the patient's regimen? Yes . Prior Authorization for treatment is: Approved o If applicable, is the correct biosimilar selected based on the patient's insurance? not applicable  Organ Function and Labs: [place] Kitchen Are dose adjustments needed based on the patient's renal function, hepatic function, or hematologic function? Yes . Are appropriate labs ordered prior to the start of patient's treatment? Yes . Other organ system assessment, if indicated: N/A . The following baseline labs, if indicated, have been ordered: N/A  Dose Assessment: . Are the drug doses appropriate? Yes . Are the following correct: o Drug concentrations Yes o IV fluid compatible with drug Yes o Administration routes Yes o Timing of therapy Yes . If applicable, does the patient have documented access for treatment and/or plans for port-a-cath placement? yes . If applicable, have lifetime cumulative doses been properly documented and assessed? yes Lifetime Dose Tracking  . Doxorubicin: 241.372 mg/m2 (528 mg) = 53.64 % of the maximum lifetime dose of 450 mg/m2  . Carboplatin: 2,850 mg = 0.01 % of the maximum lifetime dose of 999,999,999 mg  o   Toxicity Monitoring/Prevention: . The patient has the following take home antiemetics prescribed: N/A - need to f/u . The patient has the following take home medications prescribed: N/A . Medication allergies and previous infusion related reactions, if applicable, have been reviewed and addressed. Yes . The patient's current medication list has been assessed for drug-drug interactions with their chemotherapy regimen. no significant drug-drug interactions  were identified on review.  Order Review: . Are the treatment plan orders signed? No . Is the patient scheduled to see a provider prior to their treatment? Yes  I verify that I have reviewed each item in the above checklist and answered each question accordingly.   Kennith Center, Pharm.D., CPP 03/21/2020@10 :45 AM

## 2020-03-22 ENCOUNTER — Other Ambulatory Visit: Payer: Self-pay | Admitting: Physician Assistant

## 2020-03-22 ENCOUNTER — Ambulatory Visit: Payer: PPO

## 2020-03-22 ENCOUNTER — Ambulatory Visit (HOSPITAL_COMMUNITY)
Admission: RE | Admit: 2020-03-22 | Discharge: 2020-03-22 | Disposition: A | Discharge status: Home / Self Care | Payer: PPO | Source: Ambulatory Visit | Attending: General Surgery | Admitting: General Surgery

## 2020-03-22 ENCOUNTER — Other Ambulatory Visit: Payer: Self-pay

## 2020-03-22 DIAGNOSIS — Z9221 Personal history of antineoplastic chemotherapy: Secondary | ICD-10-CM | POA: Insufficient documentation

## 2020-03-22 DIAGNOSIS — M25611 Stiffness of right shoulder, not elsewhere classified: Secondary | ICD-10-CM

## 2020-03-22 DIAGNOSIS — I10 Essential (primary) hypertension: Secondary | ICD-10-CM | POA: Insufficient documentation

## 2020-03-22 DIAGNOSIS — M79622 Pain in left upper arm: Secondary | ICD-10-CM

## 2020-03-22 DIAGNOSIS — R600 Localized edema: Secondary | ICD-10-CM

## 2020-03-22 DIAGNOSIS — Z923 Personal history of irradiation: Secondary | ICD-10-CM | POA: Diagnosis not present

## 2020-03-22 DIAGNOSIS — M25612 Stiffness of left shoulder, not elsewhere classified: Secondary | ICD-10-CM

## 2020-03-22 DIAGNOSIS — M25512 Pain in left shoulder: Secondary | ICD-10-CM

## 2020-03-22 DIAGNOSIS — Z5111 Encounter for antineoplastic chemotherapy: Secondary | ICD-10-CM | POA: Diagnosis not present

## 2020-03-22 DIAGNOSIS — Z79899 Other long term (current) drug therapy: Secondary | ICD-10-CM | POA: Insufficient documentation

## 2020-03-22 DIAGNOSIS — Z85038 Personal history of other malignant neoplasm of large intestine: Secondary | ICD-10-CM | POA: Diagnosis not present

## 2020-03-22 DIAGNOSIS — C50912 Malignant neoplasm of unspecified site of left female breast: Secondary | ICD-10-CM | POA: Diagnosis not present

## 2020-03-22 DIAGNOSIS — C50919 Malignant neoplasm of unspecified site of unspecified female breast: Secondary | ICD-10-CM | POA: Diagnosis not present

## 2020-03-22 DIAGNOSIS — Z483 Aftercare following surgery for neoplasm: Secondary | ICD-10-CM

## 2020-03-22 HISTORY — PX: IR IMAGING GUIDED PORT INSERTION: IMG5740

## 2020-03-22 MED ORDER — CEFAZOLIN SODIUM-DEXTROSE 2-4 GM/100ML-% IV SOLN
2.0000 g | Freq: Once | INTRAVENOUS | Status: AC
  Administered 2020-03-22: 2 g via INTRAVENOUS

## 2020-03-22 MED ORDER — FENTANYL CITRATE (PF) 100 MCG/2ML IJ SOLN
INTRAMUSCULAR | Status: AC | PRN
  Administered 2020-03-22 (×3): 25 ug via INTRAVENOUS

## 2020-03-22 MED ORDER — MIDAZOLAM HCL 2 MG/2ML IJ SOLN
INTRAMUSCULAR | Status: AC | PRN
  Administered 2020-03-22 (×3): 0.5 mg via INTRAVENOUS
  Administered 2020-03-22: 1 mg via INTRAVENOUS
  Administered 2020-03-22: 0.5 mg via INTRAVENOUS

## 2020-03-22 MED ORDER — CEFAZOLIN SODIUM-DEXTROSE 2-4 GM/100ML-% IV SOLN
INTRAVENOUS | Status: AC
  Filled 2020-03-22: qty 100

## 2020-03-22 MED ORDER — LIDOCAINE HCL 1 % IJ SOLN
INTRAMUSCULAR | Status: AC
  Filled 2020-03-22: qty 20

## 2020-03-22 MED ORDER — LIDOCAINE HCL 1 % IJ SOLN
INTRAMUSCULAR | Status: AC | PRN
  Administered 2020-03-22: 10 mL

## 2020-03-22 MED ORDER — TETRACAINE HCL 1 % IJ SOLN: 10.0000 mg | Freq: Once | INTRAMUSCULAR | Status: DC

## 2020-03-22 MED ORDER — HEPARIN SOD (PORK) LOCK FLUSH 100 UNIT/ML IV SOLN
INTRAVENOUS | Status: AC | PRN
  Administered 2020-03-22: 500 [IU] via INTRAVENOUS

## 2020-03-22 MED ORDER — MIDAZOLAM HCL 2 MG/2ML IJ SOLN
INTRAMUSCULAR | Status: AC
  Filled 2020-03-22: qty 4

## 2020-03-22 MED ORDER — FENTANYL CITRATE (PF) 100 MCG/2ML IJ SOLN
INTRAMUSCULAR | Status: AC
  Filled 2020-03-22: qty 2

## 2020-03-22 MED ORDER — SODIUM CHLORIDE 0.9 % IV SOLN: INTRAVENOUS | Status: DC

## 2020-03-22 MED ORDER — LIDOCAINE-EPINEPHRINE 1 %-1:100000 IJ SOLN
INTRAMUSCULAR | Status: AC | PRN
  Administered 2020-03-22: 10 mL

## 2020-03-22 MED ORDER — HEPARIN SOD (PORK) LOCK FLUSH 100 UNIT/ML IV SOLN
INTRAVENOUS | Status: AC
  Filled 2020-03-22: qty 5

## 2020-03-22 MED ORDER — LIDOCAINE-EPINEPHRINE 1 %-1:100000 IJ SOLN
INTRAMUSCULAR | Status: AC
  Filled 2020-03-22: qty 1

## 2020-03-22 NOTE — Progress Notes (Signed)
Patient was given discharge instructions. Patient verbalized understanding. ?

## 2020-03-22 NOTE — H&P (Signed)
Referring Physician(s): Magrinat,G  Supervising Physician: Markus Daft  Patient Status:  [facility] OP  Chief Complaint:  "I'm here for a port a cath"  Subjective: Patient familiar to IR service from random liver biopsy in 2006, Port-A-Cath placement in 2015 with removal in 2016.  She has a history of remote colon cancer as well as  left breast cancer with prior left lumpectomy and chemoradiation 2016.  She now presents with recurrent left breast cancer and is status post left mastectomy on 02/14/2020.  She has poor venous access and is scheduled today for Port-A-Cath placement for additional chemotherapy.  She currently denies fever, headache, chest pain, cough, abdominal/back pain, nausea, vomiting or bleeding.  She does have some chronic dyspnea.  Past Medical History:  Diagnosis Date  . Colon cancer ([place])   . Hx of rotator cuff surgery   . Hypertension   . left breast cancer   . Personal history of chemotherapy 2016   left  . Personal history of radiation therapy 2016   left breast   . Radiation 11/15/14-01/02/15   left breast, axillary and supraclavicular region 45 gray, lumpectomy cavity boosted to 16 gray   Past Surgical History:  Procedure Laterality Date  . ABDOMINAL HYSTERECTOMY  1988  . BREAST EXCISIONAL BIOPSY Right 12/21/2017  . BREAST LUMPECTOMY Left 2016  . COLON SURGERY  1988   colectomy/colostomy-ca  . COLON SURGERY  1988   colostomy takedown-reversal  . COLONOSCOPY    . EXCISION / BIOPSY BREAST / NIPPLE / DUCT Left 09/20/2014  . MASTECTOMY Left 02/14/2020  . MASTECTOMY W/ SENTINEL NODE BIOPSY Left 02/14/2020   Procedure: LEFT MASTECTOMY WITH BLUE DYE INJECTION;  Surgeon: Rolm Bookbinder, MD;  Location: [place];  Service: General;  Laterality: Left;  PEC BLOCK  . RADIOACTIVE SEED GUIDED EXCISIONAL BREAST BIOPSY Right 12/21/2017   Procedure: RIGHT RADIOACTIVE SEED GUIDED EXCISIONAL BREAST BIOPSY ERAS PATHWAY;  Surgeon: Rolm Bookbinder, MD;  Location: [place];  Service: General;  Laterality: Right;  . RADIOACTIVE SEED GUIDED PARTIAL MASTECTOMY WITH AXILLARY SENTINEL LYMPH NODE BIOPSY Left 09/20/2014   Procedure: RADIOACTIVE SEED GUIDED LEFT BREAST LUMPECTOMY WITH AXILLARY SENTINEL LYMPH NODE BIOPSY;  Surgeon: Rolm Bookbinder, MD;  Location: [place];  Service: General;  Laterality: Left;  . TOTAL SHOULDER ARTHROPLASTY  2009   right      Allergies: Patient has no known allergies.  Medications: Prior to Admission medications   Medication Sig Start Date End Date Taking? Authorizing Provider  acetaminophen (TYLENOL) 500 MG tablet Take 1,000 mg by mouth every 6 (six) hours as needed for mild pain or moderate pain.   Yes [provider]  gabapentin (NEURONTIN) 300 MG capsule Take 1 capsule (300 mg total) by mouth at bedtime. 12/07/19  Yes Magrinat, Virgie Dad, MD  hydroxypropyl methylcellulose / hypromellose (ISOPTO TEARS / GONIOVISC) 2.5 % ophthalmic solution Place 1 drop into both eyes daily as needed for dry eyes.   Yes [provider]  Liniments (SALONPAS ARTHRITIS PAIN RELIEF EX) Apply 1 application topically daily as needed (knee pain).   Yes [provider]  lisinopril-hydrochlorothiazide (ZESTORETIC) 20-12.5 MG tablet Take 1/2 tablet daily by mouth Patient taking differently: Take 1 tablet by mouth daily.  05/12/19  Yes Minette Brine, FNP  Multiple Vitamins-Minerals (AIRBORNE PO) Take 1 tablet by mouth 2 (two) times a week.   Yes [provider]  ursodiol (ACTIGALL) 500 MG tablet Take 500 mg by mouth 3 (three) times daily.  Yes [provider]     Vital Signs: BP 117/68   Pulse 81   Temp 97.7 F (36.5 C) (Oral)   Resp 16   Ht 5\' 3"  (1.6 m)   Wt 226 lb (102.5 kg)   SpO2 98%   BMI 40.03 kg/m   Physical Exam awake, alert.  Chest clear to auscultation bilaterally.  Heart with regular rate and rhythm.  Abdomen soft, positive bowel sounds, nontender.  Some trace  pretibial edema bilaterally.  Imaging: No results found.  Labs:  CBC: Recent Labs    01/30/20 0946 02/07/20 0834 02/24/20 0806 03/08/20 1201  WBC 4.8 3.9* 5.6 5.8  HGB 13.1 12.3 12.4 12.3  HCT 39.9 39.1 38.3 37.6  PLT 281 297 328 299    COAGS: No results for input(s): INR, APTT in the last 8760 hours.  BMP: Recent Labs    01/30/20 0946 02/07/20 0834 02/24/20 0806 03/08/20 1201  NA 142 141 142 143  K 4.1 3.6 4.7 4.6  CL 105 104 103 108  CO2 26 26 26 23   GLUCOSE 94 94 108* 104*  BUN 12 9 25* 19  CALCIUM 10.3 9.6 10.1 10.3  CREATININE 0.86 0.82 1.16* 1.11*  GFRNONAA >60 >60 48* 51*  GFRAA >60 >60 56* 59*    LIVER FUNCTION TESTS: Recent Labs    12/07/19 1742 01/30/20 0946 02/24/20 0806 03/08/20 1201  BILITOT 0.7 1.3* 0.9 1.4*  AST 12 12* 85* 14*  ALT 16 14 84* 22  ALKPHOS 149* 135* 215* 169*  PROT 7.2 8.0 7.9 7.9  ALBUMIN 4.1 3.8 3.6 3.7    Assessment and Plan: 69 yo female with history of remote colon cancer as well as  left breast cancer with prior left lumpectomy and chemoradiation in 2016.  She now presents with recurrent left breast cancer and is status post left mastectomy on 02/14/2020.  She has poor venous access and is scheduled today for Port-A-Cath placement for additional chemotherapy. Risks and benefits of image guided port-a-catheter placement was discussed with the patient including, but not limited to bleeding, infection, pneumothorax, or fibrin sheath development and need for additional procedures.  All of the patient's questions were answered, patient is agreeable to proceed. Consent signed and in chart.     Electronically Signed: D. Rowe Robert, PA-C 03/22/2020, 10:46 AM   I spent a total of 20 minutes at the the patient's bedside AND on the patient's hospital floor or unit, greater than 50% of which was counseling/coordinating care for Port-A-Cath placement

## 2020-03-22 NOTE — Discharge Instructions (Signed)

## 2020-03-22 NOTE — Therapy (Signed)
[place], [place], [address] Phone: [phone]   Fax:  [phone]  Physical Therapy Treatment  Patient Details  Name: [name] MRN: [id number] Date of Birth: 1951/05/14 Referring Provider (PT): Rolm Bookbinder MD   Encounter Date: 03/22/2020   PT End of Session - 03/22/20 0912    Visit Number 6    Number of Visits 11    Date for PT Re-Evaluation 04/16/20    PT Start Time 0907    PT Stop Time 1000    PT Time Calculation (min) 53 min    Activity Tolerance Patient tolerated treatment well    Behavior During Therapy [facility] for tasks assessed/performed           Past Medical History:  Diagnosis Date  . Colon cancer ([place])   . Hx of rotator cuff surgery   . Hypertension   . left breast cancer   . Personal history of chemotherapy 2016   left  . Personal history of radiation therapy 2016   left breast   . Radiation 11/15/14-01/02/15   left breast, axillary and supraclavicular region 45 gray, lumpectomy cavity boosted to 16 gray    Past Surgical History:  Procedure Laterality Date  . ABDOMINAL HYSTERECTOMY  1988  . BREAST EXCISIONAL BIOPSY Right 12/21/2017  . BREAST LUMPECTOMY Left 2016  . COLON SURGERY  1988   colectomy/colostomy-ca  . COLON SURGERY  1988   colostomy takedown-reversal  . COLONOSCOPY    . EXCISION / BIOPSY BREAST / NIPPLE / DUCT Left 09/20/2014  . MASTECTOMY Left 02/14/2020  . MASTECTOMY W/ SENTINEL NODE BIOPSY Left 02/14/2020   Procedure: LEFT MASTECTOMY WITH BLUE DYE INJECTION;  Surgeon: Rolm Bookbinder, MD;  Location: [place];  Service: General;  Laterality: Left;  PEC BLOCK  . RADIOACTIVE SEED GUIDED EXCISIONAL BREAST BIOPSY Right 12/21/2017   Procedure: RIGHT RADIOACTIVE SEED GUIDED EXCISIONAL BREAST BIOPSY ERAS PATHWAY;  Surgeon: Rolm Bookbinder, MD;  Location: [place];  Service: General;  Laterality: Right;  . RADIOACTIVE SEED GUIDED PARTIAL  MASTECTOMY WITH AXILLARY SENTINEL LYMPH NODE BIOPSY Left 09/20/2014   Procedure: RADIOACTIVE SEED GUIDED LEFT BREAST LUMPECTOMY WITH AXILLARY SENTINEL LYMPH NODE BIOPSY;  Surgeon: Rolm Bookbinder, MD;  Location: [place];  Service: General;  Laterality: Left;  . TOTAL SHOULDER ARTHROPLASTY  2009   right    There were no vitals filed for this visit.   Subjective Assessment - 03/22/20 0912    Subjective Pt states that she was sore for the rest of the day and the next morning but feels like a lot of the tightness is leaving her arm.    Pertinent History Partial masectomy in the L breast in 2016 with SLNB, chemotherapy and radiation. Recent L masectomy with no lymph node removal 02/14/2020    Patient Stated Goals I want to get my arm working again and get the pain better.    Currently in Pain? No/denies    Pain Score 0-No pain              [facility] PT Assessment - 03/22/20 0001      AROM   Left Shoulder Flexion 112 Degrees    Left Shoulder ABduction 93 Degrees                         [facility] Adult PT Treatment/Exercise - 03/22/20 0001      Shoulder Exercises: Seated   Flexion AAROM;Both;Left;10  reps    Flexion Limitations seated shoulder press with dowel 10x with VC and demonstration for the correct movement. [place] noticed on the L shoulder musculature demonstration weakness.     Diagonals Left;10 reps    Diagonals Limitations P/ROM up, Assist going down with D2 extension pattern using yellow theraband 10x, Seated D1 flexion pattern independently with demonstration and return demonstration 10x LUE pain on first repetition that decreased to no increase in pain by last repetition.     Other Seated Exercises Shoulder ranger on the floor into flexion 10x , horizontal abduction with VC for correct direction and to prevent shoulder hiking 10x, D1 flexion pattern with shoulder ranger on the wall 10x modified with less cross body movement, shouder flexion in sitting  with ranger 10x with VC to avoid pain. Pt required occasional cueing throughout in order to prevent compensation with R trunk lean.     Other Seated Exercises Seated on plinth table with yellow theraball on wedge on it's side for modification due to pt has difficulty standing shoulder flexion ball roll out 10x then in scaption moving wedge to scaption position and into horizontal aduction along wedge 10x for each direction. VC to avoid trunk rotation.       Shoulder Exercises: Pulleys   Flexion 2 minutes    Flexion Limitations VC to avoid pain improved ROM from last session    Scaption 2 minutes    Scaption Limitations VC to avoid pain. assisted into further abduction for last 30 seconds with more limited ROM but pt was able to get greater ROM than last session.       Manual Therapy   Manual Therapy Soft tissue mobilization;Passive ROM    Soft tissue mobilization along the L scalenes, upper trapezius, deltoid and TpR throughout the L pectoralis major; moderatey improvement by end of session good release of multiple Tp throughout the pectoralis major.     Passive ROM Seated flexion, horizontal abduction, abduction with myofascial blocking on the L lateral trunk, improvement in relaxation and mobility from last session.                   PT Education - 03/22/20 0947    Education Details Pt will continue to work on her current HEP at home.    Person(s) Educated Patient    Methods Explanation    Comprehension Verbalized understanding            PT Short Term Goals - 03/05/20 1036      PT SHORT TERM GOAL #1   Title Pt will be independent with HEP and self MLD within 2 weeks in order to promote autonomy of care.    Baseline Pt was provided with HEP today    Time 2    Period Weeks    Status New    Target Date 03/26/20             PT Long Term Goals - 03/05/20 1039      PT LONG TERM GOAL #1   Title Pt will improve R shoulder abduction, L shoulder flexion and abduction to  120 degrees within 5 weeks in order to demonstrate improved functional ROM.    Baseline R shoulder flexion: 132 abduction: 108 L shoulder flexion: 86 abduction: 76    Time 5    Period Weeks    Status New    Target Date 04/16/20      PT LONG TERM GOAL #2   Title Pt be measured for  and receive appropriate compression garment to wear on a daily basis to manage edema at home.    Baseline currently pt has just regular bras with masectomy prosthesis in it.    Time 5    Period Weeks    Status New    Target Date 04/16/20      PT LONG TERM GOAL #3   Title Pt will improve DASH Score to 30% or less disaibility in order to demonstrate a functional improvement in the LUE.    Baseline 52.27                 Plan - 03/22/20 0911    Clinical Impression Statement Pt continues with significant tightness/stiffness at the L shoulder with decreased ROM that continues to present as a rotator cuff issue secondary to decreased ROM from surgery. She demonstrate increased A/ROM from her initial evaluation this session. Continue with increased AA/ROM activities this session into functional movement patterns at todays session utilizing bands for active assistive lowering. STM to the L shoulder, cervical area with TpR in the L pectoralis major with moderate improvement by end of session. P/ROM perfomed with improved relaxation and mobility from last session. Pt reports feeling much better and less tight by end of session. Pt will benefit from continued POC at this time.    Personal Factors and Comorbidities Comorbidity 2;Fitness    Comorbidities 02/14/2020 L masectomy, 2016 partial L masectomy with 3 lymph node removal and radiation, lumpectomy of the R breast papilloma    Rehab Potential Good    PT Frequency 2x / week    PT Treatment/Interventions Therapeutic exercise;Therapeutic activities;Neuromuscular re-education;Manual techniques    PT Next Visit Plan P/ROM, consider isometrics/ use of UE ranger for more  AROM monitoring scapula for good mobility. continue easy myofascial release STM as needed, teach self MLD    PT Home Exercise Plan Access Code: [email or web address]    Consulted and Agree with Plan of Care Patient           Patient will benefit from skilled therapeutic intervention in order to improve the following deficits and impairments:  Increased edema, Increased fascial restricitons, Decreased range of motion, Decreased knowledge of precautions, Pain  Visit Diagnosis: Acute pain of left shoulder  Stiffness of left shoulder, not elsewhere classified  Localized edema  Left axillary pain  Aftercare following surgery for neoplasm  Recurrent cancer of left breast (HCC)  Stiffness of right shoulder, not elsewhere classified     Problem List Patient Active Problem List   Diagnosis Date Noted  . Recurrent cancer of left breast ([place]) 02/14/2020  . Recurrent breast adenocarcinoma ([place]) 01/30/2020  . Essential hypertension 12/07/2019  . Dyspnea on exertion 11/11/2018  . Right leg pain 11/11/2018  . Obesity, morbid, BMI 40.0-49.9 ([place]) 10/11/2015  . 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 Genetic testing 05/24/2015  . Elevated LFTs 03/08/2015  . Anemia in neoplastic disease 06/12/2014  . Hypokalemia 05/29/2014  . Watery eyes 05/09/2014  . Malignant neoplasm of colon ([place]) 04/03/2014  . Family history of malignant neoplasm of breast 04/03/2014  . Malignant neoplasm of upper-outer quadrant of left breast in female, estrogen receptor negative ([place]) 03/01/2014    Ander Purpura, PT 03/22/2020, 10:02 AM  [place], [place], [address] Phone: [phone]   Fax:  [phone]  Name: Ayianna Darnold MRN: [id number] Date of Birth: August 21, 1951

## 2020-03-22 NOTE — Sedation Documentation (Signed)
Patient is resting comfortably. 

## 2020-03-22 NOTE — Procedures (Signed)
Interventional Radiology Procedure: ° ° °Indications: Left breast cancer ° °Procedure: Port placement ° °Findings: Right jugular port, tip at SVC/RA junction ° °Complications: None °    °EBL: Minimal, less than 10 ml ° °Plan: Discharge in one hour.  Keep port site and incisions dry for at least 24 hours.   ° ° °Jamil Castillo R. Giovanne Nickolson, MD  °Pager: [phone] ° °  °

## 2020-03-26 ENCOUNTER — Ambulatory Visit: Payer: PPO | Admitting: Physical Therapy

## 2020-03-26 ENCOUNTER — Other Ambulatory Visit: Payer: Self-pay

## 2020-03-26 ENCOUNTER — Encounter: Payer: Self-pay | Admitting: Physical Therapy

## 2020-03-26 DIAGNOSIS — M25612 Stiffness of left shoulder, not elsewhere classified: Secondary | ICD-10-CM

## 2020-03-26 DIAGNOSIS — R600 Localized edema: Secondary | ICD-10-CM

## 2020-03-26 DIAGNOSIS — M25512 Pain in left shoulder: Secondary | ICD-10-CM | POA: Diagnosis not present

## 2020-03-26 DIAGNOSIS — M79622 Pain in left upper arm: Secondary | ICD-10-CM

## 2020-03-26 DIAGNOSIS — Z483 Aftercare following surgery for neoplasm: Secondary | ICD-10-CM

## 2020-03-26 NOTE — Therapy (Signed)
[place], [place], [address] Phone: [phone]   Fax:  [phone]  Physical Therapy Treatment  Patient Details  Name: [name] MRN: [id number] Date of Birth: 07/27/1951 Referring Provider (PT): Rolm Bookbinder MD   Encounter Date: 03/26/2020   PT End of Session - 03/26/20 0954    Visit Number 7    Number of Visits 11    Date for PT Re-Evaluation 04/16/20    PT Start Time 0903    PT Stop Time 0945    PT Time Calculation (min) 42 min    Activity Tolerance Patient tolerated treatment well    Behavior During Therapy [facility] for tasks assessed/performed           Past Medical History:  Diagnosis Date  . Colon cancer ([place])   . Hx of rotator cuff surgery   . Hypertension   . left breast cancer   . Personal history of chemotherapy 2016   left  . Personal history of radiation therapy 2016   left breast   . Radiation 11/15/14-01/02/15   left breast, axillary and supraclavicular region 45 gray, lumpectomy cavity boosted to 16 gray    Past Surgical History:  Procedure Laterality Date  . ABDOMINAL HYSTERECTOMY  1988  . BREAST EXCISIONAL BIOPSY Right 12/21/2017  . BREAST LUMPECTOMY Left 2016  . COLON SURGERY  1988   colectomy/colostomy-ca  . COLON SURGERY  1988   colostomy takedown-reversal  . COLONOSCOPY    . EXCISION / BIOPSY BREAST / NIPPLE / DUCT Left 09/20/2014  . IR IMAGING GUIDED PORT INSERTION  03/22/2020  . MASTECTOMY Left 02/14/2020  . MASTECTOMY W/ SENTINEL NODE BIOPSY Left 02/14/2020   Procedure: LEFT MASTECTOMY WITH BLUE DYE INJECTION;  Surgeon: Rolm Bookbinder, MD;  Location: [place];  Service: General;  Laterality: Left;  PEC BLOCK  . RADIOACTIVE SEED GUIDED EXCISIONAL BREAST BIOPSY Right 12/21/2017   Procedure: RIGHT RADIOACTIVE SEED GUIDED EXCISIONAL BREAST BIOPSY ERAS PATHWAY;  Surgeon: Rolm Bookbinder, MD;  Location: [place];  Service: General;   Laterality: Right;  . RADIOACTIVE SEED GUIDED PARTIAL MASTECTOMY WITH AXILLARY SENTINEL LYMPH NODE BIOPSY Left 09/20/2014   Procedure: RADIOACTIVE SEED GUIDED LEFT BREAST LUMPECTOMY WITH AXILLARY SENTINEL LYMPH NODE BIOPSY;  Surgeon: Rolm Bookbinder, MD;  Location: [place];  Service: General;  Laterality: Left;  . TOTAL SHOULDER ARTHROPLASTY  2009   right    There were no vitals filed for this visit.   Subjective Assessment - 03/26/20 0911    Subjective Pt states she does not have any pain in her left [place].  She said that she still has some tightness in it. She got the port in her right side and will start chemo tomorro    Pertinent History Partial masectomy in the L breast in 2016 with SLNB, chemotherapy and radiation. Recent L masectomy with no lymph node removal 02/14/2020    Patient Stated Goals I want to get my arm working again and get the pain better.    Currently in Pain? No/denies                             [facility] Adult PT Treatment/Exercise - 03/26/20 0001      Exercises   Exercises Shoulder      Shoulder Exercises: Seated   Retraction AROM;Left;10 reps    External Rotation Strengthening;Right;Left;10 reps;Theraband    Theraband Level (Shoulder External Rotation)  Level 2 (Red)    External Rotation Limitations in front of mirror with visual cues to keep shoulder down.    Other Seated Exercises bicep curls with 1# x 2x 10 reps, pt also able to do a few with 2#, but has pain and tends to substitute with shoulder muscles.  Cued to keep shoulder down and use something from pantry for 1 pound weight at home til she gets to 30 reps and then switch to 2# weight     Other Seated Exercises UE ranger for felxion/extensoin, yellow ball under left elbow for scapular depression       Shoulder Exercises: Isometric Strengthening   Flexion 5X5"    Extension 5X5"    External Rotation 5X5"    ABduction 5X5"    Other Isometric Exercises all isometric  exercise with manual resistance       Manual Therapy   Manual Therapy Soft tissue mobilization    Soft tissue mobilization in sitting and with thick massage cream to posterior shoudler and inferior axilla to tight muscullature                   PT Education - 03/26/20 0954    Education Details isometric external rotation with red theraband and bicep curls at home    Person(s) Educated Patient    Methods Explanation;Demonstration    Comprehension Verbalized understanding;Returned demonstration            PT Short Term Goals - 03/05/20 1036      PT SHORT TERM GOAL #1   Title Pt will be independent with HEP and self MLD within 2 weeks in order to promote autonomy of care.    Baseline Pt was provided with HEP today    Time 2    Period Weeks    Status New    Target Date 03/26/20             PT Long Term Goals - 03/05/20 1039      PT LONG TERM GOAL #1   Title Pt will improve R shoulder abduction, L shoulder flexion and abduction to 120 degrees within 5 weeks in order to demonstrate improved functional ROM.    Baseline R shoulder flexion: 132 abduction: 108 L shoulder flexion: 86 abduction: 76    Time 5    Period Weeks    Status New    Target Date 04/16/20      PT LONG TERM GOAL #2   Title Pt be measured for and receive appropriate compression garment to wear on a daily basis to manage edema at home.    Baseline currently pt has just regular bras with masectomy prosthesis in it.    Time 5    Period Weeks    Status New    Target Date 04/16/20      PT LONG TERM GOAL #3   Title Pt will improve DASH Score to 30% or less disaibility in order to demonstrate a functional improvement in the LUE.    Baseline 52.27                 Plan - 03/26/20 0955    Clinical Impression Statement Pt is making progress and is happy that pain in left shoulder is much reduced.  She still has weakness so HEP was upgraded today to include shoulder external rotation and biceps  strenthening with attnention to keep shoulder substition with shoulder elevation from happening. Pt plans to start chemo tomorrow and will see how  she feels with that regarding continuation of PT    Personal Factors and Comorbidities Comorbidity 2;Fitness    Comorbidities 02/14/2020 L masectomy, 2016 partial L masectomy with 3 lymph node removal and radiation, lumpectomy of the R breast papilloma    Stability/Clinical Decision Making Stable/Uncomplicated    Rehab Potential Good    PT Frequency 2x / week    PT Next Visit Plan Remeasure shoudler ROM and check goasl. P/ROM, consider isometrics/ use of UE ranger for more AROM monitoring scapula for good mobility. continue easy myofascial release STM as needed, teach self MLD    Consulted and Agree with Plan of Care Patient           Patient will benefit from skilled therapeutic intervention in order to improve the following deficits and impairments:  Increased edema, Increased fascial restricitons, Decreased range of motion, Decreased knowledge of precautions, Pain  Visit Diagnosis: Acute pain of left shoulder  Stiffness of left shoulder, not elsewhere classified  Localized edema  Left axillary pain  Aftercare following surgery for neoplasm     Problem List Patient Active Problem List   Diagnosis Date Noted  . Recurrent cancer of left breast ([place]) 02/14/2020  . Recurrent breast adenocarcinoma ([place]) 01/30/2020  . Essential hypertension 12/07/2019  . Dyspnea on exertion 11/11/2018  . Right leg pain 11/11/2018  . Obesity, morbid, BMI 40.0-49.9 ([place]) 10/11/2015  . 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 Genetic testing 05/24/2015  . Elevated LFTs 03/08/2015  . Anemia in neoplastic disease 06/12/2014  . Hypokalemia 05/29/2014  . Watery eyes 05/09/2014  . Malignant neoplasm of colon ([place]) 04/03/2014  . Family history of malignant neoplasm of breast 04/03/2014  . Malignant neoplasm of upper-outer quadrant of left breast in  female, estrogen receptor negative ([place]) 03/01/2014   Donato Heinz. Owens Shark PT  Norwood Levo 03/26/2020, 9:59 AM  [place], [place], [address] Phone: [phone]   Fax:  [phone]  Name: [name] MRN: [id number] Date of Birth: 03-23-1951

## 2020-03-27 ENCOUNTER — Inpatient Hospital Stay: Payer: PPO

## 2020-03-27 ENCOUNTER — Encounter: Payer: Self-pay | Admitting: Adult Health

## 2020-03-27 ENCOUNTER — Ambulatory Visit: Payer: PPO

## 2020-03-27 ENCOUNTER — Inpatient Hospital Stay: Payer: PPO | Attending: Oncology

## 2020-03-27 ENCOUNTER — Other Ambulatory Visit: Payer: Self-pay

## 2020-03-27 ENCOUNTER — Encounter: Payer: Self-pay | Admitting: Oncology

## 2020-03-27 ENCOUNTER — Inpatient Hospital Stay (HOSPITAL_BASED_OUTPATIENT_CLINIC_OR_DEPARTMENT_OTHER): Payer: PPO | Admitting: Adult Health

## 2020-03-27 VITALS — BP 140/79 | HR 85 | Temp 97.4°F | Resp 18 | Ht 62.0 in | Wt 230.4 lb

## 2020-03-27 DIAGNOSIS — Z171 Estrogen receptor negative status [ER-]: Secondary | ICD-10-CM

## 2020-03-27 DIAGNOSIS — Z1589 Genetic susceptibility to other disease: Secondary | ICD-10-CM

## 2020-03-27 DIAGNOSIS — C50919 Malignant neoplasm of unspecified site of unspecified female breast: Secondary | ICD-10-CM

## 2020-03-27 DIAGNOSIS — C50412 Malignant neoplasm of upper-outer quadrant of left female breast: Secondary | ICD-10-CM

## 2020-03-27 DIAGNOSIS — Z5111 Encounter for antineoplastic chemotherapy: Secondary | ICD-10-CM | POA: Insufficient documentation

## 2020-03-27 DIAGNOSIS — Z9079 Acquired absence of other genital organ(s): Secondary | ICD-10-CM | POA: Insufficient documentation

## 2020-03-27 DIAGNOSIS — C50912 Malignant neoplasm of unspecified site of left female breast: Secondary | ICD-10-CM | POA: Diagnosis not present

## 2020-03-27 DIAGNOSIS — Z9049 Acquired absence of other specified parts of digestive tract: Secondary | ICD-10-CM | POA: Insufficient documentation

## 2020-03-27 DIAGNOSIS — Z90722 Acquired absence of ovaries, bilateral: Secondary | ICD-10-CM | POA: Diagnosis not present

## 2020-03-27 DIAGNOSIS — C50812 Malignant neoplasm of overlapping sites of left female breast: Secondary | ICD-10-CM | POA: Insufficient documentation

## 2020-03-27 DIAGNOSIS — Z923 Personal history of irradiation: Secondary | ICD-10-CM | POA: Insufficient documentation

## 2020-03-27 DIAGNOSIS — Z9012 Acquired absence of left breast and nipple: Secondary | ICD-10-CM | POA: Insufficient documentation

## 2020-03-27 DIAGNOSIS — Z803 Family history of malignant neoplasm of breast: Secondary | ICD-10-CM | POA: Diagnosis not present

## 2020-03-27 DIAGNOSIS — Z85038 Personal history of other malignant neoplasm of large intestine: Secondary | ICD-10-CM | POA: Insufficient documentation

## 2020-03-27 DIAGNOSIS — Z170 Estrogen receptor positive status [ER+]: Secondary | ICD-10-CM | POA: Diagnosis not present

## 2020-03-27 DIAGNOSIS — Z90710 Acquired absence of both cervix and uterus: Secondary | ICD-10-CM | POA: Insufficient documentation

## 2020-03-27 DIAGNOSIS — Z95828 Presence of other vascular implants and grafts: Secondary | ICD-10-CM

## 2020-03-27 LAB — CBC WITH DIFFERENTIAL/PLATELET
Abs Immature Granulocytes: 0.02 10*3/uL (ref 0.00–0.07)
Basophils Absolute: 0 10*3/uL (ref 0.0–0.1)
Basophils Relative: 1 %
Eosinophils Absolute: 0.2 10*3/uL (ref 0.0–0.5)
Eosinophils Relative: 4 %
HCT: 33.6 % — ABNORMAL LOW (ref 36.0–46.0)
Hemoglobin: 11 g/dL — ABNORMAL LOW (ref 12.0–15.0)
Immature Granulocytes: 0 %
Lymphocytes Relative: 33 %
Lymphs Abs: 1.7 10*3/uL (ref 0.7–4.0)
MCH: 30.1 pg (ref 26.0–34.0)
MCHC: 32.7 g/dL (ref 30.0–36.0)
MCV: 92.1 fL (ref 80.0–100.0)
Monocytes Absolute: 0.4 10*3/uL (ref 0.1–1.0)
Monocytes Relative: 7 %
Neutro Abs: 2.9 10*3/uL (ref 1.7–7.7)
Neutrophils Relative %: 55 %
Platelets: 243 10*3/uL (ref 150–400)
RBC: 3.65 MIL/uL — ABNORMAL LOW (ref 3.87–5.11)
RDW: 12.6 % (ref 11.5–15.5)
WBC: 5.2 10*3/uL (ref 4.0–10.5)
nRBC: 0 % (ref 0.0–0.2)

## 2020-03-27 LAB — COMPREHENSIVE METABOLIC PANEL
ALT: 18 U/L (ref 0–44)
AST: 13 U/L — ABNORMAL LOW (ref 15–41)
Albumin: 3.4 g/dL — ABNORMAL LOW (ref 3.5–5.0)
Alkaline Phosphatase: 153 U/L — ABNORMAL HIGH (ref 38–126)
Anion gap: 11 (ref 5–15)
BUN: 15 mg/dL (ref 8–23)
CO2: 24 mmol/L (ref 22–32)
Calcium: 9.9 mg/dL (ref 8.9–10.3)
Chloride: 106 mmol/L (ref 98–111)
Creatinine, Ser: 0.92 mg/dL (ref 0.44–1.00)
GFR calc Af Amer: >60 mL/min (ref >60)
GFR calc non Af Amer: >60 mL/min (ref >60)
Glucose, Bld: 103 mg/dL — ABNORMAL HIGH (ref 70–99)
Potassium: 4 mmol/L (ref 3.5–5.1)
Sodium: 141 mmol/L (ref 135–145)
Total Bilirubin: 0.8 mg/dL (ref 0.3–1.2)
Total Protein: 7.4 g/dL (ref 6.5–8.1)

## 2020-03-27 MED ORDER — FLUOROURACIL CHEMO INJECTION 2.5 GM/50ML
600.0000 mg/m2 | Freq: Once | INTRAVENOUS | Status: AC
  Administered 2020-03-27: 1300 mg via INTRAVENOUS
  Filled 2020-03-27: qty 26

## 2020-03-27 MED ORDER — LIDOCAINE-PRILOCAINE 2.5-2.5 % EX CREA: TOPICAL_CREAM | CUTANEOUS | 3 refills | Status: DC

## 2020-03-27 MED ORDER — PALONOSETRON HCL INJECTION 0.25 MG/5ML
INTRAVENOUS | Status: AC
  Filled 2020-03-27: qty 5

## 2020-03-27 MED ORDER — PROCHLORPERAZINE MALEATE 10 MG PO TABS: 10.0000 mg | ORAL_TABLET | Freq: Four times a day (QID) | ORAL | 1 refills | Status: DC | PRN

## 2020-03-27 MED ORDER — HEPARIN SOD (PORK) LOCK FLUSH 100 UNIT/ML IV SOLN
500.0000 [IU] | Freq: Once | INTRAVENOUS | Status: AC | PRN
  Administered 2020-03-27: 500 [IU]
  Filled 2020-03-27: qty 5

## 2020-03-27 MED ORDER — DEXAMETHASONE 4 MG PO TABS: 4.0000 mg | ORAL_TABLET | Freq: Every day | ORAL | 1 refills | Status: DC

## 2020-03-27 MED ORDER — PALONOSETRON HCL INJECTION 0.25 MG/5ML
0.2500 mg | Freq: Once | INTRAVENOUS | Status: AC
  Administered 2020-03-27: 0.25 mg via INTRAVENOUS

## 2020-03-27 MED ORDER — SODIUM CHLORIDE 0.9% FLUSH
10.0000 mL | INTRAVENOUS | Status: DC | PRN
  Administered 2020-03-27: 10 mL
  Filled 2020-03-27: qty 10

## 2020-03-27 MED ORDER — METHOTREXATE SODIUM (PF) CHEMO INJECTION 250 MG/10ML
40.0000 mg/m2 | Freq: Once | INTRAMUSCULAR | Status: AC
  Administered 2020-03-27: 86 mg via INTRAVENOUS
  Filled 2020-03-27: qty 3.44

## 2020-03-27 MED ORDER — SODIUM CHLORIDE 0.9 % IV SOLN
600.0000 mg/m2 | Freq: Once | INTRAVENOUS | Status: AC
  Administered 2020-03-27: 1300 mg via INTRAVENOUS
  Filled 2020-03-27: qty 65

## 2020-03-27 MED ORDER — SODIUM CHLORIDE 0.9% FLUSH
10.0000 mL | Freq: Once | INTRAVENOUS | Status: AC
  Administered 2020-03-27: 10 mL
  Filled 2020-03-27: qty 10

## 2020-03-27 MED ORDER — SODIUM CHLORIDE 0.9 % IV SOLN
Freq: Once | INTRAVENOUS | Status: AC
  Filled 2020-03-27: qty 250

## 2020-03-27 MED ORDER — SODIUM CHLORIDE 0.9 % IV SOLN
10.0000 mg | Freq: Once | INTRAVENOUS | Status: AC
  Administered 2020-03-27: 10 mg via INTRAVENOUS
  Filled 2020-03-27: qty 10

## 2020-03-27 NOTE — Patient Instructions (Signed)

## 2020-03-27 NOTE — Patient Instructions (Signed)
[facility] Discharge Instructions for Patients Receiving Chemotherapy  Today you received the following chemotherapy agents: Cycclophosphamide (Cytoxan), Methotrexate, and Fluorouracil.  To help prevent nausea and vomiting after your treatment, we encourage you to take your nausea medication as directed by your MD.   If you develop nausea and vomiting that is not controlled by your nausea medication, call the clinic.   BELOW ARE SYMPTOMS THAT SHOULD BE REPORTED IMMEDIATELY:  *FEVER GREATER THAN 100.5 F  *CHILLS WITH OR WITHOUT FEVER  NAUSEA AND VOMITING THAT IS NOT CONTROLLED WITH YOUR NAUSEA MEDICATION  *UNUSUAL SHORTNESS OF BREATH  *UNUSUAL BRUISING OR BLEEDING  TENDERNESS IN MOUTH AND THROAT WITH OR WITHOUT PRESENCE OF ULCERS  *URINARY PROBLEMS  *BOWEL PROBLEMS  UNUSUAL RASH Items with * indicate a potential emergency and should be followed up as soon as possible.  Feel free to call the clinic should you have any questions or concerns. The clinic phone number is [phone].  Please show the [facility] at check-in to the Emergency Department and triage nurse.  Cyclophosphamide Injection What is this medicine? CYCLOPHOSPHAMIDE (sye kloe FOSS fa mide) is a chemotherapy drug. It slows the growth of cancer cells. This medicine is used to treat many types of cancer like lymphoma, myeloma, leukemia, breast cancer, and ovarian cancer, to name a few. This medicine may be used for other purposes; ask your health care provider or pharmacist if you have questions. COMMON BRAND NAME(S): Cytoxan, Neosar What should I tell my health care provider before I take this medicine? They need to know if you have any of these conditions:  heart disease  history of irregular heartbeat  infection  kidney disease  liver disease  low blood counts, like white cells, platelets, or red blood cells  on hemodialysis  recent or ongoing radiation therapy  scarring  or thickening of the lungs  trouble passing urine  an unusual or allergic reaction to cyclophosphamide, other medicines, foods, dyes, or preservatives  pregnant or trying to get pregnant  breast-feeding How should I use this medicine? This drug is usually given as an injection into a vein or muscle or by infusion into a vein. It is administered in a hospital or clinic by a specially trained health care professional. Talk to your pediatrician regarding the use of this medicine in children. Special care may be needed. Overdosage: If you think you have taken too much of this medicine contact a poison control center or emergency room at once. NOTE: This medicine is only for you. Do not share this medicine with others. What if I miss a dose? It is important not to miss your dose. Call your doctor or health care professional if you are unable to keep an appointment. What may interact with this medicine?  amphotericin B  azathioprine  certain antivirals for HIV or hepatitis  certain medicines for blood pressure, heart disease, irregular heart beat  certain medicines that treat or prevent blood clots like warfarin  certain other medicines for cancer  cyclosporine  etanercept  indomethacin  medicines that relax muscles for surgery  medicines to increase blood counts  metronidazole This list may not describe all possible interactions. Give your health care provider a list of all the medicines, herbs, non-prescription drugs, or dietary supplements you use. Also tell them if you smoke, drink alcohol, or use illegal drugs. Some items may interact with your medicine. What should I watch for while using this medicine? Your condition will be monitored carefully  while you are receiving this medicine. You may need blood work done while you are taking this medicine. Drink water or other fluids as directed. Urinate often, even at night. Some products may contain alcohol. Ask your health  care professional if this medicine contains alcohol. Be sure to tell all health care professionals you are taking this medicine. Certain medicines, like metronidazole and disulfiram, can cause an unpleasant reaction when taken with alcohol. The reaction includes flushing, headache, nausea, vomiting, sweating, and increased thirst. The reaction can last from 30 minutes to several hours. Do not become pregnant while taking this medicine or for 1 year after stopping it. Women should inform their health care professional if they wish to become pregnant or think they might be pregnant. Men should not father a child while taking this medicine and for 4 months after stopping it. There is potential for serious side effects to an unborn child. Talk to your health care professional for more information. Do not breast-feed an infant while taking this medicine or for 1 week after stopping it. This medicine has caused ovarian failure in some women. This medicine may make it more difficult to get pregnant. Talk to your health care professional if you are concerned about your fertility. This medicine has caused decreased sperm counts in some men. This may make it more difficult to father a child. Talk to your health care professional if you are concerned about your fertility. Call your health care professional for advice if you get a fever, chills, or sore throat, or other symptoms of a cold or flu. Do not treat yourself. This medicine decreases your body's ability to fight infections. Try to avoid being around people who are sick. Avoid taking medicines that contain aspirin, acetaminophen, ibuprofen, naproxen, or ketoprofen unless instructed by your health care professional. These medicines may hide a fever. Talk to your health care professional about your risk of cancer. You may be more at risk for certain types of cancer if you take this medicine. If you are going to need surgery or other procedure, tell your health  care professional that you are using this medicine. Be careful brushing or flossing your teeth or using a toothpick because you may get an infection or bleed more easily. If you have any dental work done, tell your dentist you are receiving this medicine. What side effects may I notice from receiving this medicine? Side effects that you should report to your doctor or health care professional as soon as possible:  allergic reactions like skin rash, itching or hives, swelling of the face, lips, or tongue  breathing problems  nausea, vomiting  signs and symptoms of bleeding such as bloody or black, tarry stools; red or dark brown urine; spitting up blood or brown material that looks like coffee grounds; red spots on the skin; unusual bruising or bleeding from the eyes, gums, or nose  signs and symptoms of heart failure like fast, irregular heartbeat, sudden weight gain; swelling of the ankles, feet, hands  signs and symptoms of infection like fever; chills; cough; sore throat; pain or trouble passing urine  signs and symptoms of kidney injury like trouble passing urine or change in the amount of urine  signs and symptoms of liver injury like dark yellow or brown urine; general ill feeling or flu-like symptoms; light-colored stools; loss of appetite; nausea; right upper belly pain; unusually weak or tired; yellowing of the eyes or skin Side effects that usually do not require medical attention (report to  your doctor or health care professional if they continue or are bothersome):  confusion  decreased hearing  diarrhea  facial flushing  hair loss  headache  loss of appetite  missed menstrual periods  signs and symptoms of low red blood cells or anemia such as unusually weak or tired; feeling faint or lightheaded; falls  skin discoloration This list may not describe all possible side effects. Call your doctor for medical advice about side effects. You may report side effects to  FDA at 1-800-FDA-1088. Where should I keep my medicine? This drug is given in a hospital or clinic and will not be stored at home. NOTE: This sheet is a summary. It may not cover all possible information. If you have questions about this medicine, talk to your doctor, pharmacist, or health care provider.  2020 Elsevier/Gold Standard (2019-06-06 09:53:29)  Methotrexate injection What is this medicine? METHOTREXATE (METH oh TREX ate) is a chemotherapy drug used to treat cancer including breast cancer, leukemia, and lymphoma. This medicine can also be used to treat psoriasis and certain kinds of arthritis. This medicine may be used for other purposes; ask your health care provider or pharmacist if you have questions. What should I tell my health care provider before I take this medicine? They need to know if you have any of these conditions:  fluid in the stomach area or lungs  if you often drink alcohol  infection or immune system problems  kidney disease  liver disease  low blood counts, like low white cell, platelet, or red cell counts  lung disease  radiation therapy  stomach ulcers  ulcerative colitis  an unusual or allergic reaction to methotrexate, other medicines, foods, dyes, or preservatives  pregnant or trying to get pregnant  breast-feeding How should I use this medicine? This medicine is for infusion into a vein or for injection into muscle or into the spinal fluid (whichever applies). It is usually given by a health care professional in a hospital or clinic setting. In rare cases, you might get this medicine at home. You will be taught how to give this medicine. Use exactly as directed. Take your medicine at regular intervals. Do not take your medicine more often than directed. If this medicine is used for arthritis or psoriasis, it should be taken weekly, NOT daily. It is important that you put your used needles and syringes in a special sharps container. Do not  put them in a trash can. If you do not have a sharps container, call your pharmacist or healthcare provider to get one. Talk to your pediatrician regarding the use of this medicine in children. While this drug may be prescribed for children as young as 2 years for selected conditions, precautions do apply. Overdosage: If you think you have taken too much of this medicine contact a poison control center or emergency room at once. NOTE: This medicine is only for you. Do not share this medicine with others. What if I miss a dose? It is important not to miss your dose. Call your doctor or health care professional if you are unable to keep an appointment. If you give yourself the medicine and you miss a dose, talk with your doctor or health care professional. Do not take double or extra doses. What may interact with this medicine? This medicine may interact with the following medications:  acitretin  aspirin or aspirin-like medicines including salicylates  azathioprine  certain antibiotics like chloramphenicol, penicillin, tetracycline  certain medicines for stomach problems like  esomeprazole, omeprazole, pantoprazole  cyclosporine  gold  hydroxychloroquine  live virus vaccines  mercaptopurine  NSAIDs, medicines for pain and inflammation, like ibuprofen or naproxen  other cytotoxic agents  penicillamine  phenylbutazone  phenytoin  probenacid  retinoids such as isotretinoin and tretinoin  steroid medicines like prednisone or cortisone  sulfonamides like sulfasalazine and trimethoprim/sulfamethoxazole  theophylline This list may not describe all possible interactions. Give your health care provider a list of all the medicines, herbs, non-prescription drugs, or dietary supplements you use. Also tell them if you smoke, drink alcohol, or use illegal drugs. Some items may interact with your medicine. What should I watch for while using this medicine? Avoid alcoholic drinks. In  some cases, you may be given additional medicines to help with side effects. Follow all directions for their use. This medicine can make you more sensitive to the sun. Keep out of the sun. If you cannot avoid being in the sun, wear protective clothing and use sunscreen. Do not use sun lamps or tanning beds/booths. You may get drowsy or dizzy. Do not drive, use machinery, or do anything that needs mental alertness until you know how this medicine affects you. Do not stand or sit up quickly, especially if you are an older patient. This reduces the risk of dizzy or fainting spells. You may need blood work done while you are taking this medicine. Call your doctor or health care professional for advice if you get a fever, chills or sore throat, or other symptoms of a cold or flu. Do not treat yourself. This drug decreases your body's ability to fight infections. Try to avoid being around people who are sick. This medicine may increase your risk to bruise or bleed. Call your doctor or health care professional if you notice any unusual bleeding. Check with your doctor or health care professional if you get an attack of severe diarrhea, nausea and vomiting, or if you sweat a lot. The loss of too much body fluid can make it dangerous for you to take this medicine. Talk to your doctor about your risk of cancer. You may be more at risk for certain types of cancers if you take this medicine. Both men and women must use effective birth control with this medicine. Do not become pregnant while taking this medicine or until at least 1 normal menstrual cycle has occurred after stopping it. Women should inform their doctor if they wish to become pregnant or think they might be pregnant. Men should not father a child while taking this medicine and for 3 months after stopping it. There is a potential for serious side effects to an unborn child. Talk to your health care professional or pharmacist for more information. Do not  breast-feed an infant while taking this medicine. What side effects may I notice from receiving this medicine? Side effects that you should report to your doctor or health care professional as soon as possible:  allergic reactions like skin rash, itching or hives, swelling of the face, lips, or tongue  back pain  breathing problems or shortness of breath  confusion  diarrhea  dry, nonproductive cough  low blood counts - this medicine may decrease the number of white blood cells, red blood cells and platelets. You may be at increased risk of infections and bleeding  mouth sores  redness, blistering, peeling or loosening of the skin, including inside the mouth  seizures  severe headaches  signs of infection - fever or chills, cough, sore throat, pain or  difficulty passing urine  signs and symptoms of bleeding such as bloody or black, tarry stools; red or dark-brown urine; spitting up blood or brown material that looks like coffee grounds; red spots on the skin; unusual bruising or bleeding from the eye, gums, or nose  signs and symptoms of kidney injury like trouble passing urine or change in the amount of urine  signs and symptoms of liver injury like dark yellow or brown urine; general ill feeling or flu-like symptoms; light-colored stools; loss of appetite; nausea; right upper belly pain; unusually weak or tired; yellowing of the eyes or skin  stiff neck  vomiting Side effects that usually do not require medical attention (report to your doctor or health care professional if they continue or are bothersome):  dizziness  hair loss  headache  stomach pain  upset stomach This list may not describe all possible side effects. Call your doctor for medical advice about side effects. You may report side effects to FDA at 1-800-FDA-1088. Where should I keep my medicine? If you are using this medicine at home, you will be instructed on how to store this medicine. Throw away  any unused medicine after the expiration date on the label. NOTE: This sheet is a summary. It may not cover all possible information. If you have questions about this medicine, talk to your doctor, pharmacist, or health care provider.  2020 Elsevier/Gold Standard (2017-04-23 13:31:42)  Fluorouracil, 5-FU injection What is this medicine? FLUOROURACIL, 5-FU (flure oh YOOR a sil) is a chemotherapy drug. It slows the growth of cancer cells. This medicine is used to treat many types of cancer like breast cancer, colon or rectal cancer, pancreatic cancer, and stomach cancer. This medicine may be used for other purposes; ask your health care provider or pharmacist if you have questions. COMMON BRAND NAME(S): Adrucil What should I tell my health care provider before I take this medicine? They need to know if you have any of these conditions:  blood disorders  dihydropyrimidine dehydrogenase (DPD) deficiency  infection (especially a virus infection such as chickenpox, cold sores, or herpes)  kidney disease  liver disease  malnourished, poor nutrition  recent or ongoing radiation therapy  an unusual or allergic reaction to fluorouracil, other chemotherapy, other medicines, foods, dyes, or preservatives  pregnant or trying to get pregnant  breast-feeding How should I use this medicine? This drug is given as an infusion or injection into a vein. It is administered in a hospital or clinic by a specially trained health care professional. Talk to your pediatrician regarding the use of this medicine in children. Special care may be needed. Overdosage: If you think you have taken too much of this medicine contact a poison control center or emergency room at once. NOTE: This medicine is only for you. Do not share this medicine with others. What if I miss a dose? It is important not to miss your dose. Call your doctor or health care professional if you are unable to keep an appointment. What may  interact with this medicine?  allopurinol  cimetidine  dapsone  digoxin  hydroxyurea  leucovorin  levamisole  medicines for seizures like ethotoin, fosphenytoin, phenytoin  medicines to increase blood counts like filgrastim, pegfilgrastim, sargramostim  medicines that treat or prevent blood clots like warfarin, enoxaparin, and dalteparin  methotrexate  metronidazole  pyrimethamine  some other chemotherapy drugs like busulfan, cisplatin, estramustine, vinblastine  trimethoprim  trimetrexate  vaccines Talk to your doctor or health care professional before taking any  of these medicines:  acetaminophen  aspirin  ibuprofen  ketoprofen  naproxen This list may not describe all possible interactions. Give your health care provider a list of all the medicines, herbs, non-prescription drugs, or dietary supplements you use. Also tell them if you smoke, drink alcohol, or use illegal drugs. Some items may interact with your medicine. What should I watch for while using this medicine? Visit your doctor for checks on your progress. This drug may make you feel generally unwell. This is not uncommon, as chemotherapy can affect healthy cells as well as cancer cells. Report any side effects. Continue your course of treatment even though you feel ill unless your doctor tells you to stop. In some cases, you may be given additional medicines to help with side effects. Follow all directions for their use. Call your doctor or health care professional for advice if you get a fever, chills or sore throat, or other symptoms of a cold or flu. Do not treat yourself. This drug decreases your body's ability to fight infections. Try to avoid being around people who are sick. This medicine may increase your risk to bruise or bleed. Call your doctor or health care professional if you notice any unusual bleeding. Be careful brushing and flossing your teeth or using a toothpick because you may get an  infection or bleed more easily. If you have any dental work done, tell your dentist you are receiving this medicine. Avoid taking products that contain aspirin, acetaminophen, ibuprofen, naproxen, or ketoprofen unless instructed by your doctor. These medicines may hide a fever. Do not become pregnant while taking this medicine. Women should inform their doctor if they wish to become pregnant or think they might be pregnant. There is a potential for serious side effects to an unborn child. Talk to your health care professional or pharmacist for more information. Do not breast-feed an infant while taking this medicine. Men should inform their doctor if they wish to father a child. This medicine may lower sperm counts. Do not treat diarrhea with over the counter products. Contact your doctor if you have diarrhea that lasts more than 2 days or if it is severe and watery. This medicine can make you more sensitive to the sun. Keep out of the sun. If you cannot avoid being in the sun, wear protective clothing and use sunscreen. Do not use sun lamps or tanning beds/booths. What side effects may I notice from receiving this medicine? Side effects that you should report to your doctor or health care professional as soon as possible:  allergic reactions like skin rash, itching or hives, swelling of the face, lips, or tongue  low blood counts - this medicine may decrease the number of white blood cells, red blood cells and platelets. You may be at increased risk for infections and bleeding.  signs of infection - fever or chills, cough, sore throat, pain or difficulty passing urine  signs of decreased platelets or bleeding - bruising, pinpoint red spots on the skin, black, tarry stools, blood in the urine  signs of decreased red blood cells - unusually weak or tired, fainting spells, lightheadedness  breathing problems  changes in vision  chest pain  mouth sores  nausea and vomiting  pain, swelling,  redness at site where injected  pain, tingling, numbness in the hands or feet  redness, swelling, or sores on hands or feet  stomach pain  unusual bleeding Side effects that usually do not require medical attention (report to your doctor  or health care professional if they continue or are bothersome):  changes in finger or toe nails  diarrhea  dry or itchy skin  hair loss  headache  loss of appetite  sensitivity of eyes to the light  stomach upset  unusually teary eyes This list may not describe all possible side effects. Call your doctor for medical advice about side effects. You may report side effects to FDA at 1-800-FDA-1088. Where should I keep my medicine? This drug is given in a hospital or clinic and will not be stored at home. NOTE: This sheet is a summary. It may not cover all possible information. If you have questions about this medicine, talk to your doctor, pharmacist, or health care provider.  2020 Elsevier/Gold Standard (2008-01-05 13:53:16)

## 2020-03-27 NOTE — Progress Notes (Signed)
[facility]  [phone] [phone]     ID: [name] DOB: 09-29-1950  MR#: [id number]  MAU#:[id number]  Patient Care Team: Minette Brine, FNP as PCP - General (General Practice) Magrinat, Virgie Dad, MD as Consulting Physician (Oncology) Rolm Bookbinder, MD as Consulting Physician (General Surgery) Gery Pray, MD as Consulting Physician (Radiation Oncology) [name], MD as Consulting Physician (Ophthalmology)   CHIEF COMPLAINT: triple negative breast cancer; PALB2 positive  CURRENT TREATMENT: CMF  INTERVAL HISTORY: [name] returns today for evaluation of her now-recurrent left breast cancer.  She has undergone mastectomy and port placement.  Her port was placed on 03/22/2020 in IR.  She is due to receive chemotherapy today adjuvantly for her elevated oncotype.  She will receive CMF given onday 1 of a 21 day cycle x 8 cycles.    REVIEW OF SYSTEMS: [name] is doing well today.  She completed PT for her ROM on 03/26/2020.  She tolerated it well and is going to see how her treatment goes, if she needs additional therapy.  She says that she is feeling well and doing the exercises at home.  She denies any new issues, and is ready to start chemotherapy today.    She has no fever, chills, chest pain, palpitations, cough, bowel/bladder changes, new pain, headaches, vision issues, appetite decrease nausea, vomiting, or any other concerns. A  Detailed ROS was otherwise non contributory.    BREAST CANCER HISTORY: From the original intake note:  [name] herself palpated a mass in her left breast, and immediately brought it to the attention of her primary care physician, Dr. Baird Cancer, who confirmed the finding and setup the patient for bilateral diagnostic mammography at the breast Center 02/20/2014. This showed a high density mass in the outer left breast measuring up to 3.7 cm. It corresponded to the site of palpable concern. In addition the normal 4  logically abnormal lymph nodes in the left axilla. The mass was palpable by exam, and by ultrasonography measured 2.6 cm. There were enlarged and morphologically abnormal lymph nodes in the left axilla, largest measuring 3 cm.  Biopsy of the left breast mass in question and 1 of the abnormal axillary lymph nodes 02/22/2014 showed ([id number]) biopsies to be positive for an invasive ductal carcinoma, grade 3, triple negative, with an MIB-1 of 90%. Specifically the HER-2 signals ratio was 1.05, and the number per cell 2.00.  MRI of the breasts 03/01/2014 showed a 3.8 cm enhancing mass with areas of apparent necrosis in the left breast, as well as the biopsy tract extending laterally, the combined measure 5.3 cm. There were no other masses or areas of abnormal enhancement. There were multiple abnormally enlarged left axillary lymph nodes with loss of the normal fatty hilum. These included level I and retropectoral lymph nodes.  The patient's subsequent history is as detailed below   PAST MEDICAL HISTORY: Past Medical History:  Diagnosis Date  . Colon cancer ([place])   . Hx of rotator cuff surgery   . Hypertension   . left breast cancer   . Personal history of chemotherapy 2016   left  . Personal history of radiation therapy 2016   left breast   . Radiation 11/15/14-01/02/15   left breast, axillary and supraclavicular region 45 gray, lumpectomy cavity boosted to 16 gray    PAST SURGICAL HISTORY: Past Surgical History:  Procedure Laterality Date  . ABDOMINAL HYSTERECTOMY  1988  . BREAST EXCISIONAL BIOPSY Right 12/21/2017  . BREAST LUMPECTOMY Left 2016  .  COLON SURGERY  1988   colectomy/colostomy-ca  . COLON SURGERY  1988   colostomy takedown-reversal  . COLONOSCOPY    . EXCISION / BIOPSY BREAST / NIPPLE / DUCT Left 09/20/2014  . IR IMAGING GUIDED PORT INSERTION  03/22/2020  . MASTECTOMY Left 02/14/2020  . MASTECTOMY W/ SENTINEL NODE BIOPSY Left 02/14/2020   Procedure: LEFT MASTECTOMY WITH  BLUE DYE INJECTION;  Surgeon: Rolm Bookbinder, MD;  Location: [facility];  Service: General;  Laterality: Left;  PEC BLOCK  . RADIOACTIVE SEED GUIDED EXCISIONAL BREAST BIOPSY Right 12/21/2017   Procedure: RIGHT RADIOACTIVE SEED GUIDED EXCISIONAL BREAST BIOPSY ERAS PATHWAY;  Surgeon: Rolm Bookbinder, MD;  Location: [facility];  Service: General;  Laterality: Right;  . RADIOACTIVE SEED GUIDED PARTIAL MASTECTOMY WITH AXILLARY SENTINEL LYMPH NODE BIOPSY Left 09/20/2014   Procedure: RADIOACTIVE SEED GUIDED LEFT BREAST LUMPECTOMY WITH AXILLARY SENTINEL LYMPH NODE BIOPSY;  Surgeon: Rolm Bookbinder, MD;  Location: [facility];  Service: General;  Laterality: Left;  . TOTAL SHOULDER ARTHROPLASTY  2009   right    FAMILY HISTORY Family History  Problem Relation Age of Onset  . Cancer Mother 89       breast  . Breast cancer Mother        early 67s  . Stroke Father 79       cause of death  . Diabetes Father   . Cancer Maternal Aunt        breast cancer at unknown age  . Breast cancer Maternal Aunt   . Ovarian cancer Neg Hx    the patient's father died at the age of 70 following a stroke in the setting of diabetes; the patient's mother is living at 18. The patient has 3 brothers and 2 sisters. [name], [name], was diagnosed with breast cancer in her early 17s. There is no other breast or ovarian cancer in the family. The patient herself was diagnosed with watts by history he is an incidentally found stage I colon carcinoma noted at the time of her hysterectomy. She had a partial colectomy but no adjuvant treatment. We have no records regarding that procedure   GYNECOLOGIC HISTORY:  No LMP recorded. Patient has had a hysterectomy. Menarche age 39, first live birth age 52, the patient underwent total abdominal hysterectomy with bilateral salpingo-oophorectomy in her 70s. She did not take hormone replacement. She did not take oral contraceptives at any  point.   SOCIAL HISTORY: (Updated May 2021 [name] worked for [employer] for about 40 years, retiring in 2008. She is divorced and lives alone, with no pets.  Her grandson who is a Conservator, museum/gallery frequently stays with her.  Her daughter [name] works for [employer] in [employer]. The second daughter, [name], works as a Scientist, water quality at [employer].  The patient has 6 grandchildren and three great-grandchildren. She attends a [employer]. (updated 09/28/2018)    ADVANCED DIRECTIVES: Not in place. The patient intends to name her daughter [name]. [name] is work number is [phone]. On the patient's 03/01/2014 visit she was given a copy of the appropriate documents to complete and notarize at her discretion.     HEALTH MAINTENANCE: Social History   Tobacco Use  . Smoking status: Never Smoker  . Smokeless tobacco: Never Used  Vaping Use  . Vaping Use: Never used  Substance Use Topics  . Alcohol use: Not Currently    Comment: occ-rare  . Drug use: No  Colonoscopy: 2000  PAP: May 2015  Bone density: 12/21/2017, T-score of -0.6  Lipid panel: 02/03/2014  No Known Allergies  Current Outpatient Medications  Medication Sig Dispense Refill  . acetaminophen (TYLENOL) 500 MG tablet Take 1,000 mg by mouth every 6 (six) hours as needed for mild pain or moderate pain.    Marland Kitchen gabapentin (NEURONTIN) 300 MG capsule Take 1 capsule (300 mg total) by mouth at bedtime. 90 capsule 4  . hydroxypropyl methylcellulose / hypromellose (ISOPTO TEARS / GONIOVISC) 2.5 % ophthalmic solution Place 1 drop into both eyes daily as needed for dry eyes.    . Liniments (SALONPAS ARTHRITIS PAIN RELIEF EX) Apply 1 application topically daily as needed (knee pain).    Marland Kitchen lisinopril-hydrochlorothiazide (ZESTORETIC) 20-12.5 MG tablet Take 1/2 tablet daily by mouth (Patient taking differently: Take 1 tablet by mouth daily. ) 30 tablet 6  . Multiple Vitamins-Minerals  (AIRBORNE PO) Take 1 tablet by mouth 2 (two) times a week.    . ursodiol (ACTIGALL) 500 MG tablet Take 500 mg by mouth 3 (three) times daily.      No current facility-administered medications for this visit.    OBJECTIVE:   Vitals:   03/27/20 0842 03/27/20 0907  BP: (!) 136/93 140/79  Pulse: 100 85  Resp: 18   Temp: (!) 97.4 F (36.3 C)   SpO2: 100% 100%    Body mass index is 42.14 kg/m.     ECOG FS:1 - Symptomatic but completely ambulatory GENERAL: Patient is a well appearing female in no acute distress HEENT:  Sclerae anicteric.  Mask in place. Neck is supple.  NODES:  No cervical, supraclavicular, or axillary lymphadenopathy palpated.  BREAST EXAM:  Left breast surgically absent, well healed.  Steri strips present, but coming off.  No sign of infection. LUNGS:  Clear to auscultation bilaterally.  No wheezes or rhonchi. HEART:  Regular rate and rhythm. No murmur appreciated. ABDOMEN:  Soft, nontender.  Positive, normoactive bowel sounds. No organomegaly palpated. MSK:  No focal spinal tenderness to palpation.  EXTREMITIES:  No peripheral edema.   SKIN:  Clear with no obvious rashes or skin changes. No nail dyscrasia. NEURO:  Nonfocal. Well oriented.  Appropriate affect.     LAB RESULTS:  No results found for: SPEP  Lab Results  Component Value Date   WBC 5.2 03/27/2020   NEUTROABS 2.9 03/27/2020   HGB 11.0 (L) 03/27/2020   HCT 33.6 (L) 03/27/2020   MCV 92.1 03/27/2020   PLT 243 03/27/2020      Chemistry      Component Value Date/Time   NA 143 03/08/2020 1201   NA 143 12/07/2019 1742   NA 142 11/13/2016 1050   K 4.6 03/08/2020 1201   K 4.1 11/13/2016 1050   CL 108 03/08/2020 1201   CO2 23 03/08/2020 1201   CO2 27 11/13/2016 1050   BUN 19 03/08/2020 1201   BUN 13 12/07/2019 1742   BUN 11.5 11/13/2016 1050   CREATININE 1.11 (H) 03/08/2020 1201   CREATININE 0.86 01/30/2020 0946   CREATININE 1.0 11/13/2016 1050      Component Value Date/Time   CALCIUM  10.3 03/08/2020 1201   CALCIUM 9.8 11/13/2016 1050   ALKPHOS 169 (H) 03/08/2020 1201   ALKPHOS 140 11/13/2016 1050   AST 14 (L) 03/08/2020 1201   AST 12 (L) 01/30/2020 0946   AST 13 11/13/2016 1050   ALT 22 03/08/2020 1201   ALT 14 01/30/2020 0946   ALT 15 11/13/2016 1050  BILITOT 1.4 (H) 03/08/2020 1201   BILITOT 1.3 (H) 01/30/2020 0946   BILITOT 0.86 11/13/2016 1050       No results found for: LABCA2  No components found for: [id number]  No results for input(s): INR in the last 168 hours.  Urinalysis    Component Value Date/Time   COLORURINE YELLOW 04/05/2008 1120   APPEARANCEUR CLEAR 04/05/2008 1120   LABSPEC 1.021 04/05/2008 1120   PHURINE 6.0 04/05/2008 1120   GLUCOSEU NEGATIVE 04/05/2008 1120   HGBUR NEGATIVE 04/05/2008 1120   BILIRUBINUR negative 12/07/2019 1212   KETONESUR NEGATIVE 04/05/2008 1120   PROTEINUR Negative 12/07/2019 1212   PROTEINUR NEGATIVE 04/05/2008 1120   UROBILINOGEN 1.0 12/07/2019 1212   UROBILINOGEN 0.2 04/05/2008 1120   NITRITE negative 12/07/2019 1212   NITRITE NEGATIVE 04/05/2008 1120   LEUKOCYTESUR Negative 12/07/2019 1212    STUDIES: IR IMAGING GUIDED PORT INSERTION  Result Date: 03/22/2020 INDICATION: 69 year old with recurrent breast cancer. Port-A-Cath needed for chemotherapy. EXAM: FLUOROSCOPIC AND ULTRASOUND GUIDED PLACEMENT OF A SUBCUTANEOUS PORT COMPARISON:  None. MEDICATIONS: Ancef 2 g; The antibiotic was administered within an appropriate time interval prior to skin puncture. ANESTHESIA/SEDATION: Versed 3.0 mg IV; Fentanyl 100 mcg IV; Moderate Sedation Time:  35 minutes The patient was continuously monitored during the procedure by the interventional radiology nurse under my direct supervision. FLUOROSCOPY TIME:  30 seconds, 2 mGy COMPLICATIONS: None immediate. PROCEDURE: The procedure, risks, benefits, and alternatives were explained to the patient. Questions regarding the procedure were encouraged and answered. The patient  understands and consents to the procedure. Patient was placed supine on the interventional table. Ultrasound confirmed a patent right internal jugular vein. Ultrasound image was saved for documentation. The right chest and neck were cleaned with a skin antiseptic and a sterile drape was placed. Maximal barrier sterile technique was utilized including caps, mask, sterile gowns, sterile gloves, sterile drape, hand hygiene and skin antiseptic. The right neck was anesthetized with 1% lidocaine. Small incision was made in the right neck with a blade. Micropuncture set was placed in the right internal jugular vein with ultrasound guidance. The micropuncture wire was used for measurement purposes. The right chest was anesthetized with 1% lidocaine with epinephrine. #15 blade was used to make an incision and a subcutaneous port pocket was formed. Marshall was assembled. Subcutaneous tunnel was formed with a stiff tunneling device. The port catheter was brought through the subcutaneous tunnel. The port was placed in the subcutaneous pocket and sutured in place. The micropuncture set was exchanged for a peel-away sheath. The catheter was placed through the peel-away sheath and the tip was positioned at the superior cavoatrial junction. Catheter placement was confirmed with fluoroscopy. The port was accessed and flushed with heparinized saline. The port pocket was closed using two layers of absorbable sutures and Dermabond. The vein skin site was closed using a single layer of absorbable suture and Dermabond. Sterile dressings were applied. Patient tolerated the procedure well without an immediate complication. Ultrasound and fluoroscopic images were taken and saved for this procedure. IMPRESSION: Placement of a subcutaneous port device. Catheter tip at the superior cavoatrial junction. Electronically Signed   By: Markus Daft M.D.   On: 03/22/2020 13:59      ASSESSMENT: 69 y.o. BRCA negative [place] woman s/p  left breast upper outer quadrant and left axillary lymph node biopsy 02/22/2014, both positive for a clinical T2 N2, stage IIIA invasive ductal carcinoma, grade 3, triple negative, with an MIB-1 of 90%  (1) neoadjuvant  chemotherapy started 03/13/2014, with cyclophosphamide and doxorubicin in dose dense fashion x4, with Neulasta support on day 2, completed 04/24/2014, followed by weekly carboplatin and paclitaxel x12, paclitaxel reduced during cycle 5 and cycle 7 because of elevated bilirubin levels  (2) left lumpectomy and sentinel lymph node sampling 09/20/2014 showed a complete pathologic response.  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  (b) PALB2 mutations are known to increase the risk of breast and pancreatic cancer, possibly other cancers, but the data is preliminary  (c) the patient's 2 daughters have been tested and did not carry the gene  (d) [name]'s MSH2 VUS has been reclassified as "likely benign"  (e) she is status post remote TAH/BSO  (f) intensified screening, with mammography in February and breast MRI in August planned  (6) staging studies showed right-sided lung nodules, too small to characterize, and a dominant right-sided thyroid nodule unchanged in size as compared to 2011; to be followed  (a) chest CT 04/24/2016 showed the small pulmonary nodules to be completely stable, consistent with benign findings.  (b) thyroid ultrasound--done on 07/22/2018 showed enlarged heterogeneous lobular and multinodular thyroid gland, TI-RADS category 3 nodules, not meeting criteria for further biopsy or dedicated imaging f/u.    (7) intraductal papilloma biopsied right breast  11/06/2017  (a) status post right lumpectomy 12/21/2017 showing only ductal papilloma, no evidence of malignancy.  (8) anastrozole started 07/08/2018 for breast cancer prevention, discontinued 01/30/2020 with disease recurrence  (a) bone density scan 01/01/2018 normal, T score -0.6  (9) RECURRENT DISEASE May 2021  (a) left breast overlapping biopsy 01/20/2020 shows a clinical  T2 N0, stage IIB invasive ductal carcinoma, grade 2 or 3, with moderate estrogen receptor positivity, progesterone receptor negative, HER-2 not amplified, MIB-1 of 80%  (b) Left Mastectomy on 02/14/2020: IDC, grade 3, 2.3 cm, margins negative, T2, NX.   (c) CT of the chest and bone scan on 02/08/2020 shows no distant metastases  (10) to start fulvestrant and palbociclib 02/24/2020 discontinued on 03/08/2020  (a) Oncotype sent on surgical sample shows score of 60 indicating a greater than 29% risk of recurrence on antiestrogens alone, and also predicting a significant benefit from chemotherapy.  (b) the Oncotype test found this tumor to be triple negative by single gene scores.  (c) CMF to start on 03/27/2020  PLAN:  [name] will begin her first of 8 cycles of adjuvant chemotherapy with CMF.  She understands her chemotherapy regimen, and risks/benefits.  She is willing to proceed with her treatment.    I reviewed with [name] that CMF is a more gentle chemotherapy than the chemotherapy she completed several years ago with her first breast cancer diagnosis.  I sent in her anti nausea medication Dexamethasone, Prochlorperazine.  She noted that she had some old anti nausea medication from 6 years ago, and I encouraged her to dispose of those.  She will take Dexamethasone dailyx 2 days starting the day after chemotherapy, and the Prochlorperazine every 6 hours as needed.  I also sent in her emla cream.    [name] understand the above.  OVer the next week she has agreed to keep a symptom journal of any side effects she may experience, so  that we can optimize her symptom  management during this time period.  She understands this.    I requested her treatment appts be scheduled through September, 2021.  We will see [name] back in 1 week for labs and f/u.  She knows to call for any questions that may arise between now and her next appointment.  We are happy to see her sooner if needed.     Total encounter time 30 minutes.Ruth Bihari, NP 03/27/20 9:12 AM Medical Oncology and Hematology [facility] [place], [place] [address] Tel. [phone]    Fax. [phone]    *Total Encounter Time as defined by the Centers for Medicare and Medicaid Services includes, in addition to the face-to-face time of a patient visit (documented in the note above) non-face-to-face time: obtaining and reviewing outside history, ordering and reviewing medications, tests or procedures, care coordination (communications with other health care professionals or caregivers) and documentation in the medical record.

## 2020-03-27 NOTE — Progress Notes (Signed)
Blood return noted before, during, and after Methotrexate IV Injection and Fluorouracil IV injection.

## 2020-03-28 ENCOUNTER — Telehealth: Payer: Self-pay

## 2020-03-28 NOTE — Telephone Encounter (Signed)
-----   Message from Lester [place], RN sent at 03/27/2020 12:59 PM EDT ----- Regarding: Dr. Jana Hakim First time Cytoxan, Methotrexate, 5 FU. Pt. Tolerated well without issues. Had chemotherapy last in 2015. Thank you!

## 2020-03-28 NOTE — Telephone Encounter (Signed)
First treatment chemo follow up call. Patient states " I fell wonderful and everything went well.". Patient instructed to call the office with any further questions or concerns. Patient verbalized understanding.

## 2020-04-01 ENCOUNTER — Encounter: Payer: Self-pay | Admitting: Nurse Practitioner

## 2020-04-04 ENCOUNTER — Inpatient Hospital Stay: Payer: PPO

## 2020-04-04 ENCOUNTER — Inpatient Hospital Stay (HOSPITAL_BASED_OUTPATIENT_CLINIC_OR_DEPARTMENT_OTHER): Payer: PPO | Admitting: Oncology

## 2020-04-04 ENCOUNTER — Other Ambulatory Visit: Payer: Self-pay

## 2020-04-04 VITALS — BP 123/85 | HR 91 | Temp 98.3°F | Resp 18 | Ht 62.0 in | Wt 229.6 lb

## 2020-04-04 DIAGNOSIS — Z171 Estrogen receptor negative status [ER-]: Secondary | ICD-10-CM | POA: Diagnosis not present

## 2020-04-04 DIAGNOSIS — C50412 Malignant neoplasm of upper-outer quadrant of left female breast: Secondary | ICD-10-CM

## 2020-04-04 DIAGNOSIS — C50919 Malignant neoplasm of unspecified site of unspecified female breast: Secondary | ICD-10-CM

## 2020-04-04 DIAGNOSIS — Z5111 Encounter for antineoplastic chemotherapy: Secondary | ICD-10-CM | POA: Diagnosis not present

## 2020-04-04 DIAGNOSIS — Z1589 Genetic susceptibility to other disease: Secondary | ICD-10-CM | POA: Diagnosis not present

## 2020-04-04 DIAGNOSIS — C189 Malignant neoplasm of colon, unspecified: Secondary | ICD-10-CM | POA: Diagnosis not present

## 2020-04-04 DIAGNOSIS — Z95828 Presence of other vascular implants and grafts: Secondary | ICD-10-CM

## 2020-04-04 DIAGNOSIS — Z1509 Genetic susceptibility to other malignant neoplasm: Secondary | ICD-10-CM | POA: Diagnosis not present

## 2020-04-04 DIAGNOSIS — C50912 Malignant neoplasm of unspecified site of left female breast: Secondary | ICD-10-CM | POA: Diagnosis not present

## 2020-04-04 DIAGNOSIS — Z1502 Genetic susceptibility to malignant neoplasm of ovary: Secondary | ICD-10-CM | POA: Diagnosis not present

## 2020-04-04 LAB — CBC WITH DIFFERENTIAL/PLATELET
Abs Immature Granulocytes: 0.03 10*3/uL (ref 0.00–0.07)
Basophils Absolute: 0 10*3/uL (ref 0.0–0.1)
Basophils Relative: 0 %
Eosinophils Absolute: 0 10*3/uL (ref 0.0–0.5)
Eosinophils Relative: 1 %
HCT: 31.9 % — ABNORMAL LOW (ref 36.0–46.0)
Hemoglobin: 10.6 g/dL — ABNORMAL LOW (ref 12.0–15.0)
Immature Granulocytes: 1 %
Lymphocytes Relative: 14 %
Lymphs Abs: 0.6 10*3/uL — ABNORMAL LOW (ref 0.7–4.0)
MCH: 29.4 pg (ref 26.0–34.0)
MCHC: 33.2 g/dL (ref 30.0–36.0)
MCV: 88.4 fL (ref 80.0–100.0)
Monocytes Absolute: 0.2 10*3/uL (ref 0.1–1.0)
Monocytes Relative: 5 %
Neutro Abs: 3.5 10*3/uL (ref 1.7–7.7)
Neutrophils Relative %: 79 %
Platelets: 276 10*3/uL (ref 150–400)
RBC: 3.61 MIL/uL — ABNORMAL LOW (ref 3.87–5.11)
RDW: 12 % (ref 11.5–15.5)
WBC: 4.4 10*3/uL (ref 4.0–10.5)
nRBC: 0 % (ref 0.0–0.2)

## 2020-04-04 LAB — COMPREHENSIVE METABOLIC PANEL
ALT: 73 U/L — ABNORMAL HIGH (ref 0–44)
AST: 47 U/L — ABNORMAL HIGH (ref 15–41)
Albumin: 3.6 g/dL (ref 3.5–5.0)
Alkaline Phosphatase: 157 U/L — ABNORMAL HIGH (ref 38–126)
Anion gap: 12 (ref 5–15)
BUN: 33 mg/dL — ABNORMAL HIGH (ref 8–23)
CO2: 22 mmol/L (ref 22–32)
Calcium: 9.9 mg/dL (ref 8.9–10.3)
Chloride: 105 mmol/L (ref 98–111)
Creatinine, Ser: 1.15 mg/dL — ABNORMAL HIGH (ref 0.44–1.00)
GFR calc Af Amer: 56 mL/min — ABNORMAL LOW (ref >60)
GFR calc non Af Amer: 49 mL/min — ABNORMAL LOW (ref >60)
Glucose, Bld: 112 mg/dL — ABNORMAL HIGH (ref 70–99)
Potassium: 3.9 mmol/L (ref 3.5–5.1)
Sodium: 139 mmol/L (ref 135–145)
Total Bilirubin: 0.8 mg/dL (ref 0.3–1.2)
Total Protein: 7.5 g/dL (ref 6.5–8.1)

## 2020-04-04 MED ORDER — HEPARIN SOD (PORK) LOCK FLUSH 100 UNIT/ML IV SOLN
500.0000 [IU] | Freq: Once | INTRAVENOUS | Status: AC
  Administered 2020-04-04: 500 [IU]
  Filled 2020-04-04: qty 5

## 2020-04-04 MED ORDER — SODIUM CHLORIDE 0.9% FLUSH
10.0000 mL | Freq: Once | INTRAVENOUS | Status: AC
  Administered 2020-04-04: 10 mL
  Filled 2020-04-04: qty 10

## 2020-04-04 NOTE — Patient Instructions (Signed)

## 2020-04-04 NOTE — Progress Notes (Signed)
[place]  [phone] [phone]     ID: [name] DOB: 1950-09-22  MR#: [id number]  MAU#:[id number]  Patient Care Team: Minette Brine, Elrosa as PCP - General (General Practice) Ashlyn Cabler, Virgie Dad, MD as Consulting Physician (Oncology) Rolm Bookbinder, MD as Consulting Physician (General Surgery) Gery Pray, MD as Consulting Physician (Radiation Oncology) [name], MD as Consulting Physician (Ophthalmology)   CHIEF COMPLAINT: triple negative breast cancer; PALB2 positive (s/p left mastectomy)  CURRENT TREATMENT: CMF   INTERVAL HISTORY: Ahilyn returns today for follow up and treatment of her now-recurrent left breast cancer.  She started on adjuvant CMF chemotherapy on 03/27/2020.  Today is day 9 cycle 1 of 8 cycles planned   REVIEW OF SYSTEMS: Kaileigh did very well with her treatments.  She is "all right" and feels "excellent".  She says the first day was fine, days 3 and 4 she had a strange feeling, which was not exactly nausea and certainly not vomiting but which she treated with Compazine and that took care of it.  She says she has more energy and she has started some B12 vitamins.  She is beginning to exercise regularly.  Her port worked well.  She has no mouth sores and no change in bowel or bladder habits although day 1 she did have a couple of extra bowel movements that evening.  A detailed review of systems was otherwise stable.   BREAST CANCER HISTORY: From the original intake note:  Dulcy herself palpated a mass in her left breast, and immediately brought it to the attention of her primary care physician, Dr. Baird Cancer, who confirmed the finding and setup the patient for bilateral diagnostic mammography at the breast Center 02/20/2014. This showed a high density mass in the outer left breast measuring up to 3.7 cm. It corresponded to the site of palpable concern. In addition the normal 4 logically abnormal lymph nodes in the  left axilla. The mass was palpable by exam, and by ultrasonography measured 2.6 cm. There were enlarged and morphologically abnormal lymph nodes in the left axilla, largest measuring 3 cm.  Biopsy of the left breast mass in question and 1 of the abnormal axillary lymph nodes 02/22/2014 showed ([id number]) biopsies to be positive for an invasive ductal carcinoma, grade 3, triple negative, with an MIB-1 of 90%. Specifically the HER-2 signals ratio was 1.05, and the number per cell 2.00.  MRI of the breasts 03/01/2014 showed a 3.8 cm enhancing mass with areas of apparent necrosis in the left breast, as well as the biopsy tract extending laterally, the combined measure 5.3 cm. There were no other masses or areas of abnormal enhancement. There were multiple abnormally enlarged left axillary lymph nodes with loss of the normal fatty hilum. These included level I and retropectoral lymph nodes.  The patient's subsequent history is as detailed below   PAST MEDICAL HISTORY: Past Medical History:  Diagnosis Date  . Colon cancer ([place])   . Hx of rotator cuff surgery   . Hypertension   . left breast cancer   . Personal history of chemotherapy 2016   left  . Personal history of radiation therapy 2016   left breast   . Radiation 11/15/14-01/02/15   left breast, axillary and supraclavicular region 45 gray, lumpectomy cavity boosted to 16 gray    PAST SURGICAL HISTORY: Past Surgical History:  Procedure Laterality Date  . ABDOMINAL HYSTERECTOMY  1988  . BREAST EXCISIONAL BIOPSY Right 12/21/2017  . BREAST LUMPECTOMY Left 2016  .  COLON SURGERY  1988   colectomy/colostomy-ca  . COLON SURGERY  1988   colostomy takedown-reversal  . COLONOSCOPY    . EXCISION / BIOPSY BREAST / NIPPLE / DUCT Left 09/20/2014  . IR IMAGING GUIDED PORT INSERTION  03/22/2020  . MASTECTOMY Left 02/14/2020  . MASTECTOMY W/ SENTINEL NODE BIOPSY Left 02/14/2020   Procedure: LEFT MASTECTOMY WITH BLUE DYE INJECTION;  Surgeon: Rolm Bookbinder, MD;  Location: [facility];  Service: General;  Laterality: Left;  PEC BLOCK  . RADIOACTIVE SEED GUIDED EXCISIONAL BREAST BIOPSY Right 12/21/2017   Procedure: RIGHT RADIOACTIVE SEED GUIDED EXCISIONAL BREAST BIOPSY ERAS PATHWAY;  Surgeon: Rolm Bookbinder, MD;  Location: [facility];  Service: General;  Laterality: Right;  . RADIOACTIVE SEED GUIDED PARTIAL MASTECTOMY WITH AXILLARY SENTINEL LYMPH NODE BIOPSY Left 09/20/2014   Procedure: RADIOACTIVE SEED GUIDED LEFT BREAST LUMPECTOMY WITH AXILLARY SENTINEL LYMPH NODE BIOPSY;  Surgeon: Rolm Bookbinder, MD;  Location: [facility];  Service: General;  Laterality: Left;  . TOTAL SHOULDER ARTHROPLASTY  2009   right    FAMILY HISTORY Family History  Problem Relation Age of Onset  . Cancer Mother 51       breast  . Breast cancer Mother        early 61s  . Stroke Father 55       cause of death  . Diabetes Father   . Cancer Maternal Aunt        breast cancer at unknown age  . Breast cancer Maternal Aunt   . Ovarian cancer Neg Hx    the patient's father died at the age of 68 following a stroke in the setting of diabetes; the patient's mother is living at 13. The patient has 3 brothers and 2 sisters. [name], [name], was diagnosed with breast cancer in her early 53s. There is no other breast or ovarian cancer in the family. The patient herself was diagnosed with watts by history he is an incidentally found stage I colon carcinoma noted at the time of her hysterectomy. She had a partial colectomy but no adjuvant treatment. We have no records regarding that procedure   GYNECOLOGIC HISTORY:  No LMP recorded. Patient has had a hysterectomy. Menarche age 59, first live birth age 70, the patient underwent total abdominal hysterectomy with bilateral salpingo-oophorectomy in her 19s. She did not take hormone replacement. She did not take oral contraceptives at any point.   SOCIAL HISTORY: (Updated May  2021 Matsue worked for [employer] for about 40 years, retiring in 2008. She is divorced and lives alone, with no pets.  Her grandson who is a Conservator, museum/gallery frequently stays with her.  Her daughter Janica Eldred works for [employer] in Press [employer]. The second daughter, Anesa Fronek, works as a Scientist, water quality at [employer].  The patient has 6 grandchildren and three great-grandchildren. She attends a [employer]. (updated 09/28/2018)    ADVANCED DIRECTIVES: Not in place. The patient intends to name her daughter Marcello Fennel. her healthcare power of attorney. [name] is work number is [phone]. On the patient's 03/01/2014 visit she was given a copy of the appropriate documents to complete and notarize at her discretion.     HEALTH MAINTENANCE: Social History   Tobacco Use  . Smoking status: Never Smoker  . Smokeless tobacco: Never Used  Vaping Use  . Vaping Use: Never used  Substance Use Topics  . Alcohol use: Not Currently    Comment: occ-rare  . Drug use: No  Colonoscopy: 2000  PAP: May 2015  Bone density: 12/21/2017, T-score of -0.6  Lipid panel: 02/03/2014  No Known Allergies  Current Outpatient Medications  Medication Sig Dispense Refill  . acetaminophen (TYLENOL) 500 MG tablet Take 1,000 mg by mouth every 6 (six) hours as needed for mild pain or moderate pain.    Marland Kitchen dexamethasone (DECADRON) 4 MG tablet Take 1 tablet (4 mg total) by mouth daily. Start the day after chemotherapy for 2 days. Take with food. 30 tablet 1  . gabapentin (NEURONTIN) 300 MG capsule Take 1 capsule (300 mg total) by mouth at bedtime. 90 capsule 4  . hydroxypropyl methylcellulose / hypromellose (ISOPTO TEARS / GONIOVISC) 2.5 % ophthalmic solution Place 1 drop into both eyes daily as needed for dry eyes.    Marland Kitchen lidocaine-prilocaine (EMLA) cream Apply to affected area once 30 g 3  . Liniments (SALONPAS ARTHRITIS PAIN RELIEF EX) Apply 1 application topically daily as needed (knee pain).    Marland Kitchen  lisinopril-hydrochlorothiazide (ZESTORETIC) 20-12.5 MG tablet Take 1/2 tablet daily by mouth (Patient taking differently: Take 1 tablet by mouth daily. ) 30 tablet 6  . Multiple Vitamins-Minerals (AIRBORNE PO) Take 1 tablet by mouth 2 (two) times a week.    . prochlorperazine (COMPAZINE) 10 MG tablet Take 1 tablet (10 mg total) by mouth every 6 (six) hours as needed (Nausea or vomiting). 30 tablet 1  . ursodiol (ACTIGALL) 500 MG tablet Take 500 mg by mouth 3 (three) times daily.      No current facility-administered medications for this visit.    OBJECTIVE: African-American woman in no acute distress  Vitals:   04/04/20 1440  BP: 123/85  Pulse: 91  Resp: 18  Temp: 98.3 F (36.8 C)  SpO2: 99%    Body mass index is 41.99 kg/m.     ECOG FS:1 - Symptomatic but completely ambulatory  Sclerae unicteric, EOMs intact Wearing a mask No cervical or supraclavicular adenopathy Lungs no rales or rhonchi Heart regular rate and rhythm Abd soft, nontender, positive bowel sounds MSK no focal spinal tenderness, no upper extremity lymphedema Neuro: nonfocal, well oriented, appropriate affect Breasts: The right breast is benign.  The left breast is status post mastectomy.  The incision is healing nicely.  Both axillae are benign.   LAB RESULTS:  No results found for: SPEP  Lab Results  Component Value Date   WBC 4.4 04/04/2020   NEUTROABS 3.5 04/04/2020   HGB 10.6 (L) 04/04/2020   HCT 31.9 (L) 04/04/2020   MCV 88.4 04/04/2020   PLT 276 04/04/2020      Chemistry      Component Value Date/Time   NA 139 04/04/2020 1400   NA 143 12/07/2019 1742   NA 142 11/13/2016 1050   K 3.9 04/04/2020 1400   K 4.1 11/13/2016 1050   CL 105 04/04/2020 1400   CO2 22 04/04/2020 1400   CO2 27 11/13/2016 1050   BUN 33 (H) 04/04/2020 1400   BUN 13 12/07/2019 1742   BUN 11.5 11/13/2016 1050   CREATININE 1.15 (H) 04/04/2020 1400   CREATININE 0.86 01/30/2020 0946   CREATININE 1.0 11/13/2016 1050       Component Value Date/Time   CALCIUM 9.9 04/04/2020 1400   CALCIUM 9.8 11/13/2016 1050   ALKPHOS 157 (H) 04/04/2020 1400   ALKPHOS 140 11/13/2016 1050   AST 47 (H) 04/04/2020 1400   AST 12 (L) 01/30/2020 0946   AST 13 11/13/2016 1050   ALT 73 (H) 04/04/2020 1400  ALT 14 01/30/2020 0946   ALT 15 11/13/2016 1050   BILITOT 0.8 04/04/2020 1400   BILITOT 1.3 (H) 01/30/2020 0946   BILITOT 0.86 11/13/2016 1050       No results found for: LABCA2  No components found for: [id number]  No results for input(s): INR in the last 168 hours.  Urinalysis    Component Value Date/Time   COLORURINE YELLOW 04/05/2008 1120   APPEARANCEUR CLEAR 04/05/2008 1120   LABSPEC 1.021 04/05/2008 1120   PHURINE 6.0 04/05/2008 1120   GLUCOSEU NEGATIVE 04/05/2008 1120   HGBUR NEGATIVE 04/05/2008 1120   BILIRUBINUR negative 12/07/2019 1212   KETONESUR NEGATIVE 04/05/2008 1120   PROTEINUR Negative 12/07/2019 1212   PROTEINUR NEGATIVE 04/05/2008 1120   UROBILINOGEN 1.0 12/07/2019 1212   UROBILINOGEN 0.2 04/05/2008 1120   NITRITE negative 12/07/2019 1212   NITRITE NEGATIVE 04/05/2008 1120   LEUKOCYTESUR Negative 12/07/2019 1212    STUDIES: IR IMAGING GUIDED PORT INSERTION  Result Date: 03/22/2020 INDICATION: 69 year old with recurrent breast cancer. Port-A-Cath needed for chemotherapy. EXAM: FLUOROSCOPIC AND ULTRASOUND GUIDED PLACEMENT OF A SUBCUTANEOUS PORT COMPARISON:  None. MEDICATIONS: Ancef 2 g; The antibiotic was administered within an appropriate time interval prior to skin puncture. ANESTHESIA/SEDATION: Versed 3.0 mg IV; Fentanyl 100 mcg IV; Moderate Sedation Time:  35 minutes The patient was continuously monitored during the procedure by the interventional radiology nurse under my direct supervision. FLUOROSCOPY TIME:  30 seconds, 2 mGy COMPLICATIONS: None immediate. PROCEDURE: The procedure, risks, benefits, and alternatives were explained to the patient. Questions regarding the procedure were  encouraged and answered. The patient understands and consents to the procedure. Patient was placed supine on the interventional table. Ultrasound confirmed a patent right internal jugular vein. Ultrasound image was saved for documentation. The right chest and neck were cleaned with a skin antiseptic and a sterile drape was placed. Maximal barrier sterile technique was utilized including caps, mask, sterile gowns, sterile gloves, sterile drape, hand hygiene and skin antiseptic. The right neck was anesthetized with 1% lidocaine. Small incision was made in the right neck with a blade. Micropuncture set was placed in the right internal jugular vein with ultrasound guidance. The micropuncture wire was used for measurement purposes. The right chest was anesthetized with 1% lidocaine with epinephrine. #15 blade was used to make an incision and a subcutaneous port pocket was formed. [place] was assembled. Subcutaneous tunnel was formed with a stiff tunneling device. The port catheter was brought through the subcutaneous tunnel. The port was placed in the subcutaneous pocket and sutured in place. The micropuncture set was exchanged for a peel-away sheath. The catheter was placed through the peel-away sheath and the tip was positioned at the superior cavoatrial junction. Catheter placement was confirmed with fluoroscopy. The port was accessed and flushed with heparinized saline. The port pocket was closed using two layers of absorbable sutures and Dermabond. The vein skin site was closed using a single layer of absorbable suture and Dermabond. Sterile dressings were applied. Patient tolerated the procedure well without an immediate complication. Ultrasound and fluoroscopic images were taken and saved for this procedure. IMPRESSION: Placement of a subcutaneous port device. Catheter tip at the superior cavoatrial junction. Electronically Signed   By: Markus Daft M.D.   On: 03/22/2020 13:59      ASSESSMENT: 69  y.o. BRCA negative [place] woman s/p left breast upper outer quadrant and left axillary lymph node biopsy 02/22/2014, both positive for a clinical T2 N2, stage IIIA invasive ductal carcinoma, grade  3, triple negative, with an MIB-1 of 90%  (1) neoadjuvant chemotherapy started 03/13/2014, with cyclophosphamide and doxorubicin in dose dense fashion x4, with Neulasta support on day 2, completed 04/24/2014, followed by weekly carboplatin and paclitaxel x12, paclitaxel reduced during cycle 5 and cycle 7 because of elevated bilirubin levels  (2) left lumpectomy and sentinel lymph node sampling 09/20/2014 showed a complete pathologic response.  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  (b) PALB2 mutations are known to increase the risk of breast and pancreatic cancer, possibly other cancers, but the data is preliminary  (c) the patient's 2 daughters have been tested and did not carry the gene  (d) Myrlene's MSH2 VUS has been reclassified as "likely benign"  (e) she is status post remote TAH/BSO  (f) intensified screening, with mammography in February and breast MRI in August planned  (6) staging studies showed right-sided lung nodules, too small to characterize, and a dominant right-sided thyroid nodule unchanged in size as compared to 2011; to be followed  (a) chest CT 04/24/2016 showed the small pulmonary nodules to be completely stable, consistent with benign findings.  (b) thyroid ultrasound--done on 07/22/2018 showed enlarged heterogeneous lobular and multinodular thyroid gland, TI-RADS category 3 nodules, not meeting criteria for further biopsy or dedicated imaging f/u.    (7) intraductal  papilloma biopsied right breast 11/06/2017  (a) status post right lumpectomy 12/21/2017 showing only ductal papilloma, no evidence of malignancy.  (8) anastrozole started 07/08/2018 for breast cancer prevention, discontinued 01/30/2020 with disease recurrence  (a) bone density scan 01/01/2018 normal, T score -0.6  (9) RECURRENT DISEASE May 2021  (a) left breast overlapping biopsy 01/20/2020 shows a clinical  T2 N0, stage IIB invasive ductal carcinoma, grade 2 or 3, with moderate estrogen receptor positivity, progesterone receptor negative, HER-2 not amplified, MIB-1 of 80%  (b) Left Mastectomy on 02/14/2020: IDC, grade 3, 2.3 cm, margins negative, T2, NX.   (c) CT of the chest and bone scan on 02/08/2020 shows no distant metastases  (10) started fulvestrant and palbociclib 02/24/2020 discontinued on 03/08/2020  (a) Oncotype sent on surgical sample shows score of 60 indicating a greater than 29% risk of recurrence on antiestrogens alone, and also predicting a significant benefit from chemotherapy.  (b) the Oncotype test found this tumor to be triple negative by single gene scores.   (11) CMF started on 03/27/2020, to be repeated every 21 days x 8   PLAN: Mykia did remarkably well with her first cycle of CMF.  She feels "fine" and has currently no symptoms related to her treatment at this point I am not making any changes in her treatment.  She will be treated every 21 days and she will be seen every 21 days, not on day 8 since she is doing so well.  I have encouraged her to call us with any questions or concerns that may develop between treatment  Total encounter time 25 minutes.*She tells me she needs some dental work.  I gave her a copy of her labs so her dentist can see what she can do and what not.  I really do not have any restrictions regarding that  I have encouraged her to call us with any questions or concerns she  may have between treatments.  Total encounter time 25 minutes.Sarajane Jews  C. Jaymar Loeber, MD 04/04/20 3:11 PM Medical Oncology and Hematology [facility] [place], [place] [address] Tel. [phone]    Fax. [phone]   I, Wilburn Mylar, am acting as scribe for Dr. Virgie Dad. Sheena Donegan.  I, Lurline Del MD, have reviewed the above documentation for accuracy and completeness, and I agree with the above.    *Total Encounter Time as defined by the Centers for Medicare and Medicaid Services includes, in addition to the face-to-face time of a patient visit (documented in the note above) non-face-to-face time: obtaining and reviewing outside history, ordering and reviewing medications, tests or procedures, care coordination (communications with other health care professionals or caregivers) and documentation in the medical record.

## 2020-04-12 DIAGNOSIS — C50912 Malignant neoplasm of unspecified site of left female breast: Secondary | ICD-10-CM | POA: Diagnosis not present

## 2020-04-18 ENCOUNTER — Inpatient Hospital Stay: Payer: PPO | Attending: Oncology

## 2020-04-18 ENCOUNTER — Encounter: Payer: Self-pay | Admitting: Adult Health

## 2020-04-18 ENCOUNTER — Inpatient Hospital Stay: Payer: PPO

## 2020-04-18 ENCOUNTER — Inpatient Hospital Stay (HOSPITAL_BASED_OUTPATIENT_CLINIC_OR_DEPARTMENT_OTHER): Payer: PPO | Admitting: Adult Health

## 2020-04-18 ENCOUNTER — Other Ambulatory Visit: Payer: Self-pay

## 2020-04-18 ENCOUNTER — Encounter: Payer: Self-pay | Admitting: Oncology

## 2020-04-18 VITALS — HR 95

## 2020-04-18 VITALS — BP 101/72 | HR 107 | Temp 97.6°F | Resp 21 | Ht 62.0 in | Wt 229.8 lb

## 2020-04-18 DIAGNOSIS — C50412 Malignant neoplasm of upper-outer quadrant of left female breast: Secondary | ICD-10-CM

## 2020-04-18 DIAGNOSIS — Z95828 Presence of other vascular implants and grafts: Secondary | ICD-10-CM

## 2020-04-18 DIAGNOSIS — C50919 Malignant neoplasm of unspecified site of unspecified female breast: Secondary | ICD-10-CM

## 2020-04-18 DIAGNOSIS — C773 Secondary and unspecified malignant neoplasm of axilla and upper limb lymph nodes: Secondary | ICD-10-CM | POA: Insufficient documentation

## 2020-04-18 DIAGNOSIS — C50912 Malignant neoplasm of unspecified site of left female breast: Secondary | ICD-10-CM | POA: Diagnosis not present

## 2020-04-18 DIAGNOSIS — Z5111 Encounter for antineoplastic chemotherapy: Secondary | ICD-10-CM | POA: Insufficient documentation

## 2020-04-18 DIAGNOSIS — Z1589 Genetic susceptibility to other disease: Secondary | ICD-10-CM

## 2020-04-18 LAB — CBC WITH DIFFERENTIAL/PLATELET
Abs Immature Granulocytes: 0.17 10*3/uL — ABNORMAL HIGH (ref 0.00–0.07)
Basophils Absolute: 0 10*3/uL (ref 0.0–0.1)
Basophils Relative: 1 %
Eosinophils Absolute: 0.1 10*3/uL (ref 0.0–0.5)
Eosinophils Relative: 1 %
HCT: 35 % — ABNORMAL LOW (ref 36.0–46.0)
Hemoglobin: 11.5 g/dL — ABNORMAL LOW (ref 12.0–15.0)
Immature Granulocytes: 2 %
Lymphocytes Relative: 21 %
Lymphs Abs: 1.6 10*3/uL (ref 0.7–4.0)
MCH: 30.4 pg (ref 26.0–34.0)
MCHC: 32.9 g/dL (ref 30.0–36.0)
MCV: 92.6 fL (ref 80.0–100.0)
Monocytes Absolute: 0.6 10*3/uL (ref 0.1–1.0)
Monocytes Relative: 8 %
Neutro Abs: 5.1 10*3/uL (ref 1.7–7.7)
Neutrophils Relative %: 67 %
Platelets: 268 10*3/uL (ref 150–400)
RBC: 3.78 MIL/uL — ABNORMAL LOW (ref 3.87–5.11)
RDW: 13.8 % (ref 11.5–15.5)
WBC: 7.7 10*3/uL (ref 4.0–10.5)
nRBC: 0 % (ref 0.0–0.2)

## 2020-04-18 LAB — COMPREHENSIVE METABOLIC PANEL
ALT: 104 U/L — ABNORMAL HIGH (ref 0–44)
AST: 16 U/L (ref 15–41)
Albumin: 3.3 g/dL — ABNORMAL LOW (ref 3.5–5.0)
Alkaline Phosphatase: 192 U/L — ABNORMAL HIGH (ref 38–126)
Anion gap: 9 (ref 5–15)
BUN: 33 mg/dL — ABNORMAL HIGH (ref 8–23)
CO2: 20 mmol/L — ABNORMAL LOW (ref 22–32)
Calcium: 10 mg/dL (ref 8.9–10.3)
Chloride: 110 mmol/L (ref 98–111)
Creatinine, Ser: 1.26 mg/dL — ABNORMAL HIGH (ref 0.44–1.00)
GFR calc Af Amer: 50 mL/min — ABNORMAL LOW (ref >60)
GFR calc non Af Amer: 43 mL/min — ABNORMAL LOW (ref >60)
Glucose, Bld: 126 mg/dL — ABNORMAL HIGH (ref 70–99)
Potassium: 4 mmol/L (ref 3.5–5.1)
Sodium: 139 mmol/L (ref 135–145)
Total Bilirubin: 0.8 mg/dL (ref 0.3–1.2)
Total Protein: 7.2 g/dL (ref 6.5–8.1)

## 2020-04-18 MED ORDER — HEPARIN SOD (PORK) LOCK FLUSH 100 UNIT/ML IV SOLN
500.0000 [IU] | Freq: Once | INTRAVENOUS | Status: AC | PRN
  Administered 2020-04-18: 500 [IU]
  Filled 2020-04-18: qty 5

## 2020-04-18 MED ORDER — SODIUM CHLORIDE 0.9% FLUSH
10.0000 mL | INTRAVENOUS | Status: DC | PRN
  Administered 2020-04-18: 10 mL
  Filled 2020-04-18: qty 10

## 2020-04-18 MED ORDER — FLUOROURACIL CHEMO INJECTION 2.5 GM/50ML
600.0000 mg/m2 | Freq: Once | INTRAVENOUS | Status: AC
  Administered 2020-04-18: 1300 mg via INTRAVENOUS
  Filled 2020-04-18: qty 26

## 2020-04-18 MED ORDER — SODIUM CHLORIDE 0.9 % IV SOLN
10.0000 mg | Freq: Once | INTRAVENOUS | Status: AC
  Administered 2020-04-18: 10 mg via INTRAVENOUS
  Filled 2020-04-18: qty 10

## 2020-04-18 MED ORDER — METHOTREXATE SODIUM (PF) CHEMO INJECTION 250 MG/10ML
40.0000 mg/m2 | Freq: Once | INTRAMUSCULAR | Status: AC
  Administered 2020-04-18: 86 mg via INTRAVENOUS
  Filled 2020-04-18: qty 3.44

## 2020-04-18 MED ORDER — PALONOSETRON HCL INJECTION 0.25 MG/5ML
INTRAVENOUS | Status: AC
  Filled 2020-04-18: qty 5

## 2020-04-18 MED ORDER — SODIUM CHLORIDE 0.9 % IV SOLN
Freq: Once | INTRAVENOUS | Status: AC
  Filled 2020-04-18: qty 250

## 2020-04-18 MED ORDER — SODIUM CHLORIDE 0.9 % IV SOLN
600.0000 mg/m2 | Freq: Once | INTRAVENOUS | Status: AC
  Administered 2020-04-18: 1300 mg via INTRAVENOUS
  Filled 2020-04-18: qty 65

## 2020-04-18 MED ORDER — PALONOSETRON HCL INJECTION 0.25 MG/5ML
0.2500 mg | Freq: Once | INTRAVENOUS | Status: AC
  Administered 2020-04-18: 0.25 mg via INTRAVENOUS

## 2020-04-18 MED ORDER — SODIUM CHLORIDE 0.9% FLUSH
10.0000 mL | Freq: Once | INTRAVENOUS | Status: AC
  Administered 2020-04-18: 10 mL
  Filled 2020-04-18: qty 10

## 2020-04-18 NOTE — Patient Instructions (Signed)
[facility] Discharge Instructions for Patients Receiving Chemotherapy  Today you received the following chemotherapy agents: Cytoxan, methotrexate, fluorouracil   To help prevent nausea and vomiting after your treatment, we encourage you to take your nausea medication as directed.    If you develop nausea and vomiting that is not controlled by your nausea medication, call the clinic.   BELOW ARE SYMPTOMS THAT SHOULD BE REPORTED IMMEDIATELY:  *FEVER GREATER THAN 100.5 F  *CHILLS WITH OR WITHOUT FEVER  NAUSEA AND VOMITING THAT IS NOT CONTROLLED WITH YOUR NAUSEA MEDICATION  *UNUSUAL SHORTNESS OF BREATH  *UNUSUAL BRUISING OR BLEEDING  TENDERNESS IN MOUTH AND THROAT WITH OR WITHOUT PRESENCE OF ULCERS  *URINARY PROBLEMS  *BOWEL PROBLEMS  UNUSUAL RASH Items with * indicate a potential emergency and should be followed up as soon as possible.  Feel free to call the clinic should you have any questions or concerns. The clinic phone number is [phone].  Please show the CHEMO ALERT CARD at check-in to the Emergency Department and triage nurse.   

## 2020-04-18 NOTE — Progress Notes (Signed)
Per Wilber Bihari, NP, OK to treat with ALT 104.

## 2020-04-18 NOTE — Progress Notes (Signed)
[place]  Telephone:([phone] Fax:(336) [phone]     ID: [name] DOB: [birth year]  MR#: [id number]  QZR#:[id number]  Patient Care Team: Minette Brine, [place] as PCP - General (General Practice) Magrinat, Virgie Dad, MD as Consulting Physician (Oncology) Rolm Bookbinder, MD as Consulting Physician (General Surgery) Gery Pray, MD as Consulting Physician (Radiation Oncology) [name], MD as Consulting Physician (Ophthalmology)   CHIEF COMPLAINT: triple negative breast cancer; PALB2 positive (s/p left mastectomy)  CURRENT TREATMENT: CMF   INTERVAL HISTORY: [name] returns today for follow up and treatment of her now-recurrent left breast cancer.  She started on adjuvant CMF chemotherapy on 03/27/2020.  Today is day 1 cycle 2 of 8 cycles planned.  She is feeling well today.  She notes yesterday she did a lot of activity, slept very well last night, and woke up this morning with mild shortness of breath.  It is only present with walking.     REVIEW OF SYSTEMS: [name] says she feels great.  She denies any discomfort, fever, chills, cough, chest pain, shortness of breath, headaches, nausea, vomiting, fatigue, bowel/bladder changes, palpitations, or any other concerns.  She was very active yesterday with cleaning and caring for her mom.  She is feeling well and a detailed ROS was otherwise non contributory.   BREAST CANCER HISTORY: From the original intake note:  [name] herself palpated a mass in her left breast, and immediately brought it to the attention of her primary care physician, Dr. Baird Cancer, who confirmed the finding and setup the patient for bilateral diagnostic mammography at the breast Center 02/20/2014. This showed a high density mass in the outer left breast measuring up to 3.7 cm. It corresponded to the site of palpable concern. In addition the normal 4 logically abnormal lymph nodes in the left axilla. The mass was palpable by exam, and by  ultrasonography measured 2.6 cm. There were enlarged and morphologically abnormal lymph nodes in the left axilla, largest measuring 3 cm.  Biopsy of the left breast mass in question and 1 of the abnormal axillary lymph nodes 02/22/2014 showed ([id number]) biopsies to be positive for an invasive ductal carcinoma, grade 3, triple negative, with an MIB-1 of 90%. Specifically the HER-2 signals ratio was 1.05, and the number per cell 2.00.  MRI of the breasts 03/01/2014 showed a 3.8 cm enhancing mass with areas of apparent necrosis in the left breast, as well as the biopsy tract extending laterally, the combined measure 5.3 cm. There were no other masses or areas of abnormal enhancement. There were multiple abnormally enlarged left axillary lymph nodes with loss of the normal fatty hilum. These included level I and retropectoral lymph nodes.  The patient's subsequent history is as detailed below   PAST MEDICAL HISTORY: Past Medical History:  Diagnosis Date  . Colon cancer ([place])   . Hx of rotator cuff surgery   . Hypertension   . left breast cancer   . Personal history of chemotherapy 2016   left  . Personal history of radiation therapy 2016   left breast   . Radiation 11/15/14-01/02/15   left breast, axillary and supraclavicular region 45 gray, lumpectomy cavity boosted to 16 gray    PAST SURGICAL HISTORY: Past Surgical History:  Procedure Laterality Date  . ABDOMINAL HYSTERECTOMY  1988  . BREAST EXCISIONAL BIOPSY Right 12/21/2017  . BREAST LUMPECTOMY Left 2016  . COLON SURGERY  1988   colectomy/colostomy-ca  . COLON SURGERY  1988   colostomy takedown-reversal  .  COLONOSCOPY    . EXCISION / BIOPSY BREAST / NIPPLE / DUCT Left 09/20/2014  . IR IMAGING GUIDED PORT INSERTION  03/22/2020  . MASTECTOMY Left 02/14/2020  . MASTECTOMY W/ SENTINEL NODE BIOPSY Left 02/14/2020   Procedure: LEFT MASTECTOMY WITH BLUE DYE INJECTION;  Surgeon: Rolm Bookbinder, MD;  Location: [facility];  Service: General;   Laterality: Left;  PEC BLOCK  . RADIOACTIVE SEED GUIDED EXCISIONAL BREAST BIOPSY Right 12/21/2017   Procedure: RIGHT RADIOACTIVE SEED GUIDED EXCISIONAL BREAST BIOPSY ERAS PATHWAY;  Surgeon: Rolm Bookbinder, MD;  Location: [facility];  Service: General;  Laterality: Right;  . RADIOACTIVE SEED GUIDED PARTIAL MASTECTOMY WITH AXILLARY SENTINEL LYMPH NODE BIOPSY Left 09/20/2014   Procedure: RADIOACTIVE SEED GUIDED LEFT BREAST LUMPECTOMY WITH AXILLARY SENTINEL LYMPH NODE BIOPSY;  Surgeon: Rolm Bookbinder, MD;  Location: [facility];  Service: General;  Laterality: Left;  . TOTAL SHOULDER ARTHROPLASTY  2009   right    FAMILY HISTORY Family History  Problem Relation Age of Onset  . Cancer Mother 88       breast  . Breast cancer Mother        early 43s  . Stroke Father 66       cause of death  . Diabetes Father   . Cancer Maternal Aunt        breast cancer at unknown age  . Breast cancer Maternal Aunt   . Ovarian cancer Neg Hx    the patient's father died at the age of 42 following a stroke in the setting of diabetes; the patient's mother is living at 30. The patient has 3 brothers and 2 sisters. [name], [name], was diagnosed with breast cancer in her early 11s. There is no other breast or ovarian cancer in the family. The patient herself was diagnosed with watts by history he is an incidentally found stage I colon carcinoma noted at the time of her hysterectomy. She had a partial colectomy but no adjuvant treatment. We have no records regarding that procedure   GYNECOLOGIC HISTORY:  No LMP recorded. Patient has had a hysterectomy. Menarche age 3, first live birth age 60, the patient underwent total abdominal hysterectomy with bilateral salpingo-oophorectomy in her 27s. She did not take hormone replacement. She did not take oral contraceptives at any point.   SOCIAL HISTORY: (Updated May 2021 [name] worked for [employer] for about 40 years,  retiring in 2008. She is divorced and lives alone, with no pets.  Her grandson who is a Conservator, museum/gallery frequently stays with her.  Her daughter [name] works for [employer] in [employer]. The second daughter, Antonella Upson, works as a Scientist, water quality at [employer].  The patient has 6 grandchildren and three great-grandchildren. She attends a [employer]. (updated 09/28/2018)    ADVANCED DIRECTIVES: Not in place. The patient intends to name her daughter Marcello Fennel. her healthcare power of attorney. [name] is work number is [phone]. On the patient's 03/01/2014 visit she was given a copy of the appropriate documents to complete and notarize at her discretion.     HEALTH MAINTENANCE: Social History   Tobacco Use  . Smoking status: Never Smoker  . Smokeless tobacco: Never Used  Vaping Use  . Vaping Use: Never used  Substance Use Topics  . Alcohol use: Not Currently    Comment: occ-rare  . Drug use: No     Colonoscopy: 2000  PAP: May 2015  Bone density: 12/21/2017, T-score of -0.6  Lipid  panel: 02/03/2014  No Known Allergies  Current Outpatient Medications  Medication Sig Dispense Refill  . acetaminophen (TYLENOL) 500 MG tablet Take 1,000 mg by mouth every 6 (six) hours as needed for mild pain or moderate pain.    Marland Kitchen dexamethasone (DECADRON) 4 MG tablet Take 1 tablet (4 mg total) by mouth daily. Start the day after chemotherapy for 2 days. Take with food. 30 tablet 1  . gabapentin (NEURONTIN) 300 MG capsule Take 1 capsule (300 mg total) by mouth at bedtime. 90 capsule 4  . hydroxypropyl methylcellulose / hypromellose (ISOPTO TEARS / GONIOVISC) 2.5 % ophthalmic solution Place 1 drop into both eyes daily as needed for dry eyes.    Marland Kitchen lidocaine-prilocaine (EMLA) cream Apply to affected area once 30 g 3  . Liniments (SALONPAS ARTHRITIS PAIN RELIEF EX) Apply 1 application topically daily as needed (knee pain).    Marland Kitchen lisinopril-hydrochlorothiazide (ZESTORETIC) 20-12.5 MG tablet  Take 1/2 tablet daily by mouth (Patient taking differently: Take 1 tablet by mouth daily. ) 30 tablet 6  . Multiple Vitamins-Minerals (AIRBORNE PO) Take 1 tablet by mouth 2 (two) times a week.    . prochlorperazine (COMPAZINE) 10 MG tablet Take 1 tablet (10 mg total) by mouth every 6 (six) hours as needed (Nausea or vomiting). 30 tablet 1  . ursodiol (ACTIGALL) 500 MG tablet Take 500 mg by mouth 3 (three) times daily.      No current facility-administered medications for this visit.   Facility-Administered Medications Ordered in Other Visits  Medication Dose Route Frequency Provider Last Rate Last Admin  . 0.9 %  sodium chloride infusion   Intravenous Once Marcellene Shivley, Charlestine Massed, NP      . cyclophosphamide (CYTOXAN) 1,300 mg in sodium chloride 0.9 % 250 mL chemo infusion  600 mg/m2 (Treatment Plan Recorded) Intravenous Once Maryhelen Lindler, Charlestine Massed, NP      . dexamethasone (DECADRON) 10 mg in sodium chloride 0.9 % 50 mL IVPB  10 mg Intravenous Once Reem Fleury, Charlestine Massed, NP      . fluorouracil (ADRUCIL) chemo injection 1,300 mg  600 mg/m2 (Treatment Plan Recorded) Intravenous Once Gardenia Phlegm, NP      . heparin lock flush 100 unit/mL  500 Units Intracatheter Once PRN Gardenia Phlegm, NP      . methotrexate (PF) chemo injection 86 mg  40 mg/m2 (Treatment Plan Recorded) Intravenous Once Gardenia Phlegm, NP      . palonosetron (ALOXI) injection 0.25 mg  0.25 mg Intravenous Once Jaselynn Tamas, Charlestine Massed, NP      . sodium chloride flush (NS) 0.9 % injection 10 mL  10 mL Intracatheter PRN Gardenia Phlegm, NP        OBJECTIVE: Vitals:   04/18/20 1331  BP: 101/72  Pulse: (!) 107  Resp: (!) 21  Temp: 97.6 F (36.4 C)  SpO2: 100%    Body mass index is 42.03 kg/m.     ECOG FS:1 - Symptomatic but completely ambulatory GENERAL: Patient is a well appearing female in no acute distress HEENT:  Sclerae anicteric.  Mask in place. Neck is supple.  NODES:   No cervical, supraclavicular, or axillary lymphadenopathy palpated.  BREAST EXAM:  Left mastectomy site healing well.  No sign of local recurrence LUNGS:  Clear to auscultation bilaterally.  No wheezes or rhonchi. HEART:  Regular rate and rhythm.  HR on recheck x 1 minute was 95. No murmur appreciated. ABDOMEN:  Soft, nontender.  Positive, normoactive bowel sounds. No organomegaly palpated. MSK:  No focal spinal tenderness to palpation. Full range of motion bilaterally in the upper extremities. EXTREMITIES:  No peripheral edema.   SKIN:  Clear with no obvious rashes or skin changes. No nail dyscrasia. NEURO:  Nonfocal. Well oriented.  Appropriate affect.    LAB RESULTS:  No results found for: SPEP  Lab Results  Component Value Date   WBC 7.7 04/18/2020   NEUTROABS 5.1 04/18/2020   HGB 11.5 (L) 04/18/2020   HCT 35.0 (L) 04/18/2020   MCV 92.6 04/18/2020   PLT 268 04/18/2020      Chemistry      Component Value Date/Time   NA 139 04/18/2020 1313   NA 143 12/07/2019 1742   NA 142 11/13/2016 1050   K 4.0 04/18/2020 1313   K 4.1 11/13/2016 1050   CL 110 04/18/2020 1313   CO2 20 (L) 04/18/2020 1313   CO2 27 11/13/2016 1050   BUN 33 (H) 04/18/2020 1313   BUN 13 12/07/2019 1742   BUN 11.5 11/13/2016 1050   CREATININE 1.26 (H) 04/18/2020 1313   CREATININE 0.86 01/30/2020 0946   CREATININE 1.0 11/13/2016 1050      Component Value Date/Time   CALCIUM 10.0 04/18/2020 1313   CALCIUM 9.8 11/13/2016 1050   ALKPHOS 192 (H) 04/18/2020 1313   ALKPHOS 140 11/13/2016 1050   AST 16 04/18/2020 1313   AST 12 (L) 01/30/2020 0946   AST 13 11/13/2016 1050   ALT 104 (H) 04/18/2020 1313   ALT 14 01/30/2020 0946   ALT 15 11/13/2016 1050   BILITOT 0.8 04/18/2020 1313   BILITOT 1.3 (H) 01/30/2020 0946   BILITOT 0.86 11/13/2016 1050       No results found for: LABCA2  No components found for: [id number]  No results for input(s): INR in the last 168 hours.  Urinalysis    Component  Value Date/Time   COLORURINE YELLOW 04/05/2008 1120   APPEARANCEUR CLEAR 04/05/2008 1120   LABSPEC 1.021 04/05/2008 1120   PHURINE 6.0 04/05/2008 1120   GLUCOSEU NEGATIVE 04/05/2008 1120   HGBUR NEGATIVE 04/05/2008 1120   BILIRUBINUR negative 12/07/2019 1212   KETONESUR NEGATIVE 04/05/2008 1120   PROTEINUR Negative 12/07/2019 1212   PROTEINUR NEGATIVE 04/05/2008 1120   UROBILINOGEN 1.0 12/07/2019 1212   UROBILINOGEN 0.2 04/05/2008 1120   NITRITE negative 12/07/2019 1212   NITRITE NEGATIVE 04/05/2008 1120   LEUKOCYTESUR Negative 12/07/2019 1212    STUDIES: IR IMAGING GUIDED PORT INSERTION  Result Date: 03/22/2020 INDICATION: 69 year old with recurrent breast cancer. Port-A-Cath needed for chemotherapy. EXAM: FLUOROSCOPIC AND ULTRASOUND GUIDED PLACEMENT OF A SUBCUTANEOUS PORT COMPARISON:  None. MEDICATIONS: Ancef 2 g; The antibiotic was administered within an appropriate time interval prior to skin puncture. ANESTHESIA/SEDATION: Versed 3.0 mg IV; Fentanyl 100 mcg IV; Moderate Sedation Time:  35 minutes The patient was continuously monitored during the procedure by the interventional radiology nurse under my direct supervision. FLUOROSCOPY TIME:  30 seconds, 2 mGy COMPLICATIONS: None immediate. PROCEDURE: The procedure, risks, benefits, and alternatives were explained to the patient. Questions regarding the procedure were encouraged and answered. The patient understands and consents to the procedure. Patient was placed supine on the interventional table. Ultrasound confirmed a patent right internal jugular vein. Ultrasound image was saved for documentation. The right chest and neck were cleaned with a skin antiseptic and a sterile drape was placed. Maximal barrier sterile technique was utilized including caps, mask, sterile gowns, sterile gloves, sterile drape, hand hygiene and skin antiseptic. The right neck was anesthetized with 1%  lidocaine. Small incision was made in the right neck with a blade.  Micropuncture set was placed in the right internal jugular vein with ultrasound guidance. The micropuncture wire was used for measurement purposes. The right chest was anesthetized with 1% lidocaine with epinephrine. #15 blade was used to make an incision and a subcutaneous port pocket was formed. [place] was assembled. Subcutaneous tunnel was formed with a stiff tunneling device. The port catheter was brought through the subcutaneous tunnel. The port was placed in the subcutaneous pocket and sutured in place. The micropuncture set was exchanged for a peel-away sheath. The catheter was placed through the peel-away sheath and the tip was positioned at the superior cavoatrial junction. Catheter placement was confirmed with fluoroscopy. The port was accessed and flushed with heparinized saline. The port pocket was closed using two layers of absorbable sutures and Dermabond. The vein skin site was closed using a single layer of absorbable suture and Dermabond. Sterile dressings were applied. Patient tolerated the procedure well without an immediate complication. Ultrasound and fluoroscopic images were taken and saved for this procedure. IMPRESSION: Placement of a subcutaneous port device. Catheter tip at the superior cavoatrial junction. Electronically Signed   By: Markus Daft M.D.   On: 03/22/2020 13:59      ASSESSMENT: 69 y.o. BRCA negative [place] woman s/p left breast upper outer quadrant and left axillary lymph node biopsy 02/22/2014, both positive for a clinical T2 N2, stage IIIA invasive ductal carcinoma, grade 3, triple negative, with an MIB-1 of 90%  (1) neoadjuvant chemotherapy started 03/13/2014, with cyclophosphamide and doxorubicin in dose dense fashion x4, with Neulasta support on day 2, completed 04/24/2014, followed by weekly carboplatin and paclitaxel x12, paclitaxel reduced during cycle 5 and cycle 7 because of elevated bilirubin levels  (2) left lumpectomy and sentinel lymph node  sampling 09/20/2014 showed a complete pathologic response.  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  (b) PALB2 mutations are known to increase the risk of breast and pancreatic cancer, possibly other cancers, but the data is preliminary  (c) the patient's 2 daughters have been tested and did not carry the gene  (d) Rande's MSH2 VUS has been reclassified as "likely benign"  (e) she is status post remote TAH/BSO  (f) intensified screening, with mammography in February and breast MRI in August planned  (6) staging studies showed right-sided lung nodules, too small to characterize, and a dominant right-sided thyroid nodule unchanged in size as compared to 2011; to be followed  (a) chest CT 04/24/2016 showed the small pulmonary nodules to be completely stable, consistent with benign findings.  (b) thyroid ultrasound--done on 07/22/2018 showed enlarged heterogeneous lobular and multinodular thyroid gland, TI-RADS category 3 nodules, not meeting criteria for further biopsy or dedicated imaging f/u.    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  (8) anastrozole started 07/08/2018 for breast cancer prevention, discontinued 01/30/2020 with disease recurrence  (a) bone density scan 01/01/2018 normal, T score -0.6  (9) RECURRENT DISEASE May 2021  (a) left breast overlapping biopsy 01/20/2020 shows a clinical  T2 N0, stage IIB invasive ductal carcinoma, grade 2 or 3, with  moderate estrogen receptor positivity, progesterone receptor negative, HER-2 not amplified, MIB-1 of 80%  (b)  Left Mastectomy on 02/14/2020: IDC, grade 3, 2.3 cm, margins negative, T2, NX.   (c) CT of the chest and bone scan on 02/08/2020 shows no distant metastases  (10) started fulvestrant and palbociclib 02/24/2020 discontinued on 03/08/2020  (a) Oncotype sent on surgical sample shows score of 60 indicating a greater than 29% risk of recurrence on antiestrogens alone, and also predicting a significant benefit from chemotherapy.  (b) the Oncotype test found this tumor to be triple negative by single gene scores.   (11) CMF started on 03/27/2020, to be repeated every 21 days x 8   PLAN: Charlayne continues on CMF every 3 weeks as adjuvant treatment for her breast cancer.  She is tolerating this well.  Her labs are stable, and she will proceed to therapy.  Adalai is having some mild shortness of breath.  This is not problematic for her.  She likely did too much yesterday.  Should this shortness of breath persist, she knows to call me and if it worsens we talked about her coming into clinic or going to the ER.  Her lung exam is normal and her oxygen saturation is normal today.  She is having no palpitations, and her HR has decreased to 95 on exam.  I let Lahoma know that being active is good, however, doing too much can actually cause increased fatigue.  She understands.    We will see her back in 3 weeks for labs, f/u, and cycle 3 of CMF.  She knows to call for any questions that may arise between now and her next appointment.  We are happy to see her sooner if needed.  Total encounter time 20 minutes.Wilber Bihari, NP 04/18/20 2:30 PM Medical Oncology and Hematology [facility] [place], [place] [address] Tel. [phone]    Fax. [phone]     *Total Encounter Time as defined by the Centers for Medicare and Medicaid Services includes, in addition to the face-to-face time of a patient visit (documented in the note above) non-face-to-face time: obtaining and reviewing  outside history, ordering and reviewing medications, tests or procedures, care coordination (communications with other health care professionals or caregivers) and documentation in the medical record.

## 2020-04-19 ENCOUNTER — Ambulatory Visit: Payer: PPO | Admitting: Internal Medicine

## 2020-04-19 ENCOUNTER — Encounter: Payer: Self-pay | Admitting: Nurse Practitioner

## 2020-04-19 ENCOUNTER — Telehealth: Payer: Self-pay | Admitting: Adult Health

## 2020-04-19 ENCOUNTER — Ambulatory Visit (INDEPENDENT_AMBULATORY_CARE_PROVIDER_SITE_OTHER): Payer: PPO | Admitting: Nurse Practitioner

## 2020-04-19 VITALS — BP 110/80 | HR 94 | Temp 98.3°F | Ht 61.6 in | Wt 233.8 lb

## 2020-04-19 DIAGNOSIS — I10 Essential (primary) hypertension: Secondary | ICD-10-CM

## 2020-04-19 DIAGNOSIS — C50912 Malignant neoplasm of unspecified site of left female breast: Secondary | ICD-10-CM | POA: Diagnosis not present

## 2020-04-19 MED FILL — LISINOPRIL-HCTZ 20-12.5 MG: 20-12.5 | 30 days supply | Qty: 30 | Fill #4

## 2020-04-19 NOTE — Telephone Encounter (Signed)
No 8/4 los, no changes made to pt schedule  

## 2020-04-19 NOTE — Progress Notes (Signed)
This visit occurred during the SARS-CoV-2 public health emergency.  Safety protocols were in place, including screening questions prior to the visit, additional usage of staff PPE, and extensive cleaning of exam room while observing appropriate contact time as indicated for disinfecting solutions.  Subjective:     [name] , female    DOB: 1951/07/22 , 69 y.o.   MRN: [id number]   Chief Complaint  Patient presents with   Hypertension    HPI  She is getting her chemo now every other week, will feel weak after the 3rd day.  Her daughter lives about 3-4 houses down from her.  She had some mild shortness of breath yesterday and advised Dr.Magrinat.   Wt Readings from Last 3 Encounters: 04/19/20 : 233 lb 12.8 oz (106.1 kg) 04/18/20 : 229 lb 12.8 oz (104.2 kg) 04/04/20 : 229 lb 9.6 oz (104.1 kg)    Hypertension This is a chronic problem. The current episode started more than 1 year ago. The problem is unchanged. The problem is controlled. Pertinent negatives include no anxiety, chest pain, headaches or palpitations. There are no associated agents to hypertension. Risk factors for coronary artery disease include sedentary lifestyle and obesity. Past treatments include diuretics and ACE inhibitors. Compliance problems include exercise.  There is no history of angina. There is no history of chronic renal disease.     Past Medical History:  Diagnosis Date   Colon cancer ([facility])    Hx of rotator cuff surgery    Hypertension    left breast cancer    Personal history of chemotherapy 2016   left   Personal history of radiation therapy 2016   left breast    Radiation 11/15/14-01/02/15   left breast, axillary and supraclavicular region 45 gray, lumpectomy cavity boosted to 16 gray     Family History  Problem Relation Age of Onset   Cancer Mother 67       breast   Breast cancer Mother        early 67s   Stroke Father 13       cause of death   Diabetes  Father    Cancer Maternal Aunt        breast cancer at unknown age   Breast cancer Maternal Aunt    Ovarian cancer Neg Hx      Current Outpatient Medications:    acetaminophen (TYLENOL) 500 MG tablet, Take 1,000 mg by mouth every 6 (six) hours as needed for mild pain or moderate pain., Disp: , Rfl:    dexamethasone (DECADRON) 4 MG tablet, Take 1 tablet (4 mg total) by mouth daily. Start the day after chemotherapy for 2 days. Take with food., Disp: 30 tablet, Rfl: 1   gabapentin (NEURONTIN) 300 MG capsule, Take 1 capsule (300 mg total) by mouth at bedtime., Disp: 90 capsule, Rfl: 4   hydroxypropyl methylcellulose / hypromellose (ISOPTO TEARS / GONIOVISC) 2.5 % ophthalmic solution, Place 1 drop into both eyes daily as needed for dry eyes., Disp: , Rfl:    lidocaine-prilocaine (EMLA) cream, Apply to affected area once, Disp: 30 g, Rfl: 3   Liniments (SALONPAS ARTHRITIS PAIN RELIEF EX), Apply 1 application topically daily as needed (knee pain)., Disp: , Rfl:    lisinopril-hydrochlorothiazide (ZESTORETIC) 20-12.5 MG tablet, Take 1/2 tablet daily by mouth (Patient taking differently: Take 1 tablet by mouth daily. ), Disp: 30 tablet, Rfl: 6   Multiple Vitamins-Minerals (AIRBORNE PO), Take 1 tablet by mouth 2 (two) times a week.,  Disp: , Rfl:    prochlorperazine (COMPAZINE) 10 MG tablet, Take 1 tablet (10 mg total) by mouth every 6 (six) hours as needed (Nausea or vomiting)., Disp: 30 tablet, Rfl: 1   ursodiol (ACTIGALL) 500 MG tablet, Take 500 mg by mouth 3 (three) times daily. , Disp: , Rfl:    No Known Allergies   Review of Systems  Constitutional: Negative.   Respiratory: Negative.   Cardiovascular: Negative.  Negative for chest pain, palpitations and leg swelling.  Gastrointestinal: Negative.   Musculoskeletal: Negative.   Skin: Negative.   Neurological: Negative for dizziness and headaches.  Psychiatric/Behavioral: Negative.      Today's Vitals   04/19/20 1413  BP:  110/80  Pulse: 94  Temp: 98.3 F (36.8 C)  TempSrc: Oral  Weight: 233 lb 12.8 oz (106.1 kg)  Height: 5' 1.6" (1.565 m)  PainSc: 0-No pain   Body mass index is 43.32 kg/m.   Objective:  Physical Exam Constitutional:      General: She is not in acute distress.    Appearance: Normal appearance. She is obese.  Eyes:     Pupils: Pupils are equal, round, and reactive to light.  Cardiovascular:     Rate and Rhythm: Normal rate and regular rhythm.     Pulses: Normal pulses.     Heart sounds: Normal heart sounds. No murmur heard.   Pulmonary:     Effort: Pulmonary effort is normal. No respiratory distress.     Breath sounds: Normal breath sounds. No wheezing.  Musculoskeletal:     Right lower leg: No edema.     Left lower leg: No edema.  Skin:    General: Skin is warm and dry.     Capillary Refill: Capillary refill takes less than 2 seconds.  Neurological:     General: No focal deficit present.     Mental Status: She is alert and oriented to person, place, and time.     Cranial Nerves: No cranial nerve deficit.  Psychiatric:        Mood and Affect: Mood normal.        Behavior: Behavior normal.        Thought Content: Thought content normal.        Judgment: Judgment normal.         Assessment And Plan:     1. Essential hypertension Comments: chronic, well controlled continue with current medications  2. 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 continue follow up with Oncology     Patient was given opportunity to ask questions. Patient verbalized understanding of the plan and was able to repeat key elements of the plan. All questions were answered to their satisfaction.  Minette Brine, FNP   I, Minette Brine, FNP, have reviewed all documentation for this visit. The documentation on 04/19/20 for the exam, diagnosis, procedures, and orders are all accurate and complete.   THE PATIENT IS  ENCOURAGED TO PRACTICE SOCIAL DISTANCING DUE TO THE COVID-19 PANDEMIC.

## 2020-05-09 ENCOUNTER — Encounter: Payer: Self-pay | Admitting: Adult Health

## 2020-05-09 ENCOUNTER — Inpatient Hospital Stay: Payer: PPO

## 2020-05-09 ENCOUNTER — Inpatient Hospital Stay: Payer: PPO | Admitting: Adult Health

## 2020-05-09 ENCOUNTER — Other Ambulatory Visit: Payer: Self-pay

## 2020-05-09 ENCOUNTER — Encounter: Payer: Self-pay | Admitting: Oncology

## 2020-05-09 VITALS — BP 120/90 | HR 109 | Temp 97.6°F | Resp 18 | Ht 61.6 in | Wt 238.9 lb

## 2020-05-09 VITALS — HR 94

## 2020-05-09 DIAGNOSIS — C50412 Malignant neoplasm of upper-outer quadrant of left female breast: Secondary | ICD-10-CM

## 2020-05-09 DIAGNOSIS — Z95828 Presence of other vascular implants and grafts: Secondary | ICD-10-CM

## 2020-05-09 DIAGNOSIS — Z1589 Genetic susceptibility to other disease: Secondary | ICD-10-CM

## 2020-05-09 DIAGNOSIS — Z5111 Encounter for antineoplastic chemotherapy: Secondary | ICD-10-CM | POA: Diagnosis not present

## 2020-05-09 DIAGNOSIS — C50912 Malignant neoplasm of unspecified site of left female breast: Secondary | ICD-10-CM

## 2020-05-09 DIAGNOSIS — C50919 Malignant neoplasm of unspecified site of unspecified female breast: Secondary | ICD-10-CM

## 2020-05-09 DIAGNOSIS — Z171 Estrogen receptor negative status [ER-]: Secondary | ICD-10-CM | POA: Diagnosis not present

## 2020-05-09 LAB — CBC WITH DIFFERENTIAL/PLATELET
Abs Immature Granulocytes: 0.06 10*3/uL (ref 0.00–0.07)
Basophils Absolute: 0 10*3/uL (ref 0.0–0.1)
Basophils Relative: 1 %
Eosinophils Absolute: 0.1 10*3/uL (ref 0.0–0.5)
Eosinophils Relative: 1 %
HCT: 32.3 % — ABNORMAL LOW (ref 36.0–46.0)
Hemoglobin: 10.5 g/dL — ABNORMAL LOW (ref 12.0–15.0)
Immature Granulocytes: 1 %
Lymphocytes Relative: 18 %
Lymphs Abs: 0.9 10*3/uL (ref 0.7–4.0)
MCH: 30.6 pg (ref 26.0–34.0)
MCHC: 32.5 g/dL (ref 30.0–36.0)
MCV: 94.2 fL (ref 80.0–100.0)
Monocytes Absolute: 0.6 10*3/uL (ref 0.1–1.0)
Monocytes Relative: 12 %
Neutro Abs: 3.1 10*3/uL (ref 1.7–7.7)
Neutrophils Relative %: 67 %
Platelets: 221 10*3/uL (ref 150–400)
RBC: 3.43 MIL/uL — ABNORMAL LOW (ref 3.87–5.11)
RDW: 14.6 % (ref 11.5–15.5)
WBC: 4.7 10*3/uL (ref 4.0–10.5)
nRBC: 0.6 % — ABNORMAL HIGH (ref 0.0–0.2)

## 2020-05-09 LAB — COMPREHENSIVE METABOLIC PANEL
ALT: 45 U/L — ABNORMAL HIGH (ref 0–44)
AST: 15 U/L (ref 15–41)
Albumin: 3 g/dL — ABNORMAL LOW (ref 3.5–5.0)
Alkaline Phosphatase: 161 U/L — ABNORMAL HIGH (ref 38–126)
Anion gap: 8 (ref 5–15)
BUN: 15 mg/dL (ref 8–23)
CO2: 26 mmol/L (ref 22–32)
Calcium: 9.8 mg/dL (ref 8.9–10.3)
Chloride: 104 mmol/L (ref 98–111)
Creatinine, Ser: 1.13 mg/dL — ABNORMAL HIGH (ref 0.44–1.00)
GFR calc Af Amer: 57 mL/min — ABNORMAL LOW (ref >60)
GFR calc non Af Amer: 50 mL/min — ABNORMAL LOW (ref >60)
Glucose, Bld: 96 mg/dL (ref 70–99)
Potassium: 4.2 mmol/L (ref 3.5–5.1)
Sodium: 138 mmol/L (ref 135–145)
Total Bilirubin: 0.8 mg/dL (ref 0.3–1.2)
Total Protein: 6.6 g/dL (ref 6.5–8.1)

## 2020-05-09 MED ORDER — HEPARIN SOD (PORK) LOCK FLUSH 100 UNIT/ML IV SOLN
500.0000 [IU] | Freq: Once | INTRAVENOUS | Status: AC | PRN
  Administered 2020-05-09: 500 [IU]
  Filled 2020-05-09: qty 5

## 2020-05-09 MED ORDER — SODIUM CHLORIDE 0.9% FLUSH
10.0000 mL | Freq: Once | INTRAVENOUS | Status: AC
  Administered 2020-05-09: 10 mL
  Filled 2020-05-09: qty 10

## 2020-05-09 MED ORDER — FLUOROURACIL CHEMO INJECTION 2.5 GM/50ML
600.0000 mg/m2 | Freq: Once | INTRAVENOUS | Status: AC
  Administered 2020-05-09: 1300 mg via INTRAVENOUS
  Filled 2020-05-09: qty 26

## 2020-05-09 MED ORDER — SODIUM CHLORIDE 0.9% FLUSH
10.0000 mL | INTRAVENOUS | Status: DC | PRN
  Administered 2020-05-09: 10 mL
  Filled 2020-05-09: qty 10

## 2020-05-09 MED ORDER — SODIUM CHLORIDE 0.9 % IV SOLN
10.0000 mg | Freq: Once | INTRAVENOUS | Status: AC
  Administered 2020-05-09: 10 mg via INTRAVENOUS
  Filled 2020-05-09: qty 10

## 2020-05-09 MED ORDER — PALONOSETRON HCL INJECTION 0.25 MG/5ML
INTRAVENOUS | Status: AC
  Filled 2020-05-09: qty 5

## 2020-05-09 MED ORDER — METHOTREXATE SODIUM (PF) CHEMO INJECTION 250 MG/10ML
40.0000 mg/m2 | Freq: Once | INTRAMUSCULAR | Status: AC
  Administered 2020-05-09: 86 mg via INTRAVENOUS
  Filled 2020-05-09: qty 3.44

## 2020-05-09 MED ORDER — SODIUM CHLORIDE 0.9 % IV SOLN
600.0000 mg/m2 | Freq: Once | INTRAVENOUS | Status: AC
  Administered 2020-05-09: 1300 mg via INTRAVENOUS
  Filled 2020-05-09: qty 65

## 2020-05-09 MED ORDER — PALONOSETRON HCL INJECTION 0.25 MG/5ML
0.2500 mg | Freq: Once | INTRAVENOUS | Status: AC
  Administered 2020-05-09: 0.25 mg via INTRAVENOUS

## 2020-05-09 MED ORDER — SODIUM CHLORIDE 0.9 % IV SOLN
Freq: Once | INTRAVENOUS | Status: AC
  Filled 2020-05-09: qty 250

## 2020-05-09 NOTE — Progress Notes (Signed)
[facility]  [phone] [phone]     ID: [name] DOB: 1951/03/24  MR#: [id number]  JYN#:[id number]  Patient Care Team: Minette Brine, Haring as PCP - General (General Practice) Magrinat, Virgie Dad, MD as Consulting Physician (Oncology) Rolm Bookbinder, MD as Consulting Physician (General Surgery) Gery Pray, MD as Consulting Physician (Radiation Oncology) [name], MD as Consulting Physician (Ophthalmology)   CHIEF COMPLAINT: triple negative breast cancer; PALB2 positive (s/p left mastectomy)  CURRENT TREATMENT: CMF   INTERVAL HISTORY: Abrie returns today for follow up and treatment of her now-recurrent left breast cancer.  She started on adjuvant CMF chemotherapy on 03/27/2020.  Today is cycle 3 of her chemotherapy.  She is tolerating this well.  She notes mild nausea the first two days after her treatment, and takes anti emetics as needed which help it to resolve.   REVIEW OF SYSTEMS: Haisley says she feels great. She is active and gets up and walks around her house a couple of times per week.  She is eating well, and has no new issues such as mucositis, fever, chills, chest pain, cough, shortness of breath, bowel/bladder changes, headaches, vision issues or any other concerns.  A detailed ROS was otherwise non contributory today.  BREAST CANCER HISTORY: From the original intake note:  Jari herself palpated a mass in her left breast, and immediately brought it to the attention of her primary care physician, Dr. Baird Cancer, who confirmed the finding and setup the patient for bilateral diagnostic mammography at the breast Center 02/20/2014. This showed a high density mass in the outer left breast measuring up to 3.7 cm. It corresponded to the site of palpable concern. In addition the normal 4 logically abnormal lymph nodes in the left axilla. The mass was palpable by exam, and by ultrasonography measured 2.6 cm. There were enlarged  and morphologically abnormal lymph nodes in the left axilla, largest measuring 3 cm.  Biopsy of the left breast mass in question and 1 of the abnormal axillary lymph nodes 02/22/2014 showed ([id number]) biopsies to be positive for an invasive ductal carcinoma, grade 3, triple negative, with an MIB-1 of 90%. Specifically the HER-2 signals ratio was 1.05, and the number per cell 2.00.  MRI of the breasts 03/01/2014 showed a 3.8 cm enhancing mass with areas of apparent necrosis in the left breast, as well as the biopsy tract extending laterally, the combined measure 5.3 cm. There were no other masses or areas of abnormal enhancement. There were multiple abnormally enlarged left axillary lymph nodes with loss of the normal fatty hilum. These included level I and retropectoral lymph nodes.  The patient's subsequent history is as detailed below   PAST MEDICAL HISTORY: Past Medical History:  Diagnosis Date  . Colon cancer ([facility])   . Hx of rotator cuff surgery   . Hypertension   . left breast cancer   . Personal history of chemotherapy 2016   left  . Personal history of radiation therapy 2016   left breast   . Radiation 11/15/14-01/02/15   left breast, axillary and supraclavicular region 45 gray, lumpectomy cavity boosted to 16 gray    PAST SURGICAL HISTORY: Past Surgical History:  Procedure Laterality Date  . ABDOMINAL HYSTERECTOMY  1988  . BREAST EXCISIONAL BIOPSY Right 12/21/2017  . BREAST LUMPECTOMY Left 2016  . COLON SURGERY  1988   colectomy/colostomy-ca  . COLON SURGERY  1988   colostomy takedown-reversal  . COLONOSCOPY    . EXCISION / BIOPSY BREAST /  NIPPLE / DUCT Left 09/20/2014  . IR IMAGING GUIDED PORT INSERTION  03/22/2020  . MASTECTOMY Left 02/14/2020  . MASTECTOMY W/ SENTINEL NODE BIOPSY Left 02/14/2020   Procedure: LEFT MASTECTOMY WITH BLUE DYE INJECTION;  Surgeon: Rolm Bookbinder, MD;  Location: [facility];  Service: General;  Laterality: Left;  PEC BLOCK  . RADIOACTIVE SEED  GUIDED EXCISIONAL BREAST BIOPSY Right 12/21/2017   Procedure: RIGHT RADIOACTIVE SEED GUIDED EXCISIONAL BREAST BIOPSY ERAS PATHWAY;  Surgeon: Rolm Bookbinder, MD;  Location: [facility];  Service: General;  Laterality: Right;  . RADIOACTIVE SEED GUIDED PARTIAL MASTECTOMY WITH AXILLARY SENTINEL LYMPH NODE BIOPSY Left 09/20/2014   Procedure: RADIOACTIVE SEED GUIDED LEFT BREAST LUMPECTOMY WITH AXILLARY SENTINEL LYMPH NODE BIOPSY;  Surgeon: Rolm Bookbinder, MD;  Location: [facility];  Service: General;  Laterality: Left;  . TOTAL SHOULDER ARTHROPLASTY  2009   right    FAMILY HISTORY Family History  Problem Relation Age of Onset  . Cancer Mother 58       breast  . Breast cancer Mother        early 55s  . Stroke Father 62       cause of death  . Diabetes Father   . Cancer Maternal Aunt        breast cancer at unknown age  . Breast cancer Maternal Aunt   . Ovarian cancer Neg Hx    the patient's father died at the age of 65 following a stroke in the setting of diabetes; the patient's mother is living at 59. The patient has 3 brothers and 2 sisters. [name], [name], was diagnosed with breast cancer in her early 33s. There is no other breast or ovarian cancer in the family. The patient herself was diagnosed with watts by history he is an incidentally found stage I colon carcinoma noted at the time of her hysterectomy. She had a partial colectomy but no adjuvant treatment. We have no records regarding that procedure   GYNECOLOGIC HISTORY:  No LMP recorded. Patient has had a hysterectomy. Menarche age 72, first live birth age 61, the patient underwent total abdominal hysterectomy with bilateral salpingo-oophorectomy in her 48s. She did not take hormone replacement. She did not take oral contraceptives at any point.   SOCIAL HISTORY: (Updated May 2021 Rashawn worked for [employer] for about 40 years, retiring in 2008. She is divorced and lives alone, with  no pets.  Her grandson who is a Conservator, museum/gallery frequently stays with her.  Her daughter Meaghann Choo works for [employer] in Press photographer. The second daughter, Yocheved Depner, works as a Scientist, water quality at [employer].  The patient has 6 grandchildren and three great-grandchildren. She attends a [employer]. (updated 09/28/2018)    ADVANCED DIRECTIVES: Not in place. The patient intends to name her daughter Marcello Fennel. her healthcare power of attorney. [name] is work number is [phone]. On the patient's 03/01/2014 visit she was given a copy of the appropriate documents to complete and notarize at her discretion.     HEALTH MAINTENANCE: Social History   Tobacco Use  . Smoking status: Never Smoker  . Smokeless tobacco: Never Used  Vaping Use  . Vaping Use: Never used  Substance Use Topics  . Alcohol use: Not Currently    Comment: occ-rare  . Drug use: No     Colonoscopy: 2000  PAP: May 2015  Bone density: 12/21/2017, T-score of -0.6  Lipid panel: 02/03/2014  No Known Allergies  Current Outpatient Medications  Medication Sig Dispense Refill  . acetaminophen (TYLENOL) 500 MG tablet Take 1,000 mg by mouth every 6 (six) hours as needed for mild pain or moderate pain.    Marland Kitchen dexamethasone (DECADRON) 4 MG tablet Take 1 tablet (4 mg total) by mouth daily. Start the day after chemotherapy for 2 days. Take with food. 30 tablet 1  . gabapentin (NEURONTIN) 300 MG capsule Take 1 capsule (300 mg total) by mouth at bedtime. 90 capsule 4  . hydroxypropyl methylcellulose / hypromellose (ISOPTO TEARS / GONIOVISC) 2.5 % ophthalmic solution Place 1 drop into both eyes daily as needed for dry eyes.    Marland Kitchen lidocaine-prilocaine (EMLA) cream Apply to affected area once 30 g 3  . Liniments (SALONPAS ARTHRITIS PAIN RELIEF EX) Apply 1 application topically daily as needed (knee pain).    Marland Kitchen lisinopril-hydrochlorothiazide (ZESTORETIC) 20-12.5 MG tablet Take 1/2 tablet daily by mouth (Patient taking  differently: Take 1 tablet by mouth daily. ) 30 tablet 6  . Multiple Vitamins-Minerals (AIRBORNE PO) Take 1 tablet by mouth 2 (two) times a week.    . prochlorperazine (COMPAZINE) 10 MG tablet Take 1 tablet (10 mg total) by mouth every 6 (six) hours as needed (Nausea or vomiting). 30 tablet 1  . ursodiol (ACTIGALL) 500 MG tablet Take 500 mg by mouth 3 (three) times daily.      No current facility-administered medications for this visit.    OBJECTIVE: Vitals:   05/09/20 1259  BP: 120/90  Pulse: (!) 109  Resp: 18  Temp: 97.6 F (36.4 C)  SpO2: 100%    Body mass index is 44.26 kg/m.     ECOG FS:1 - Symptomatic but completely ambulatory GENERAL: Patient is a well appearing female in no acute distress HEENT:  Sclerae anicteric.  Mask in place. Neck is supple.  NODES:  No cervical, supraclavicular, or axillary lymphadenopathy palpated.  BREAST EXAM:  Deferred LUNGS:  Clear to auscultation bilaterally.  No wheezes or rhonchi. HEART:  Regular rate and rhythm. No murmur appreciated. ABDOMEN:  Soft, nontender.  Positive, normoactive bowel sounds. No organomegaly palpated. MSK:  No focal spinal tenderness to palpation. Full range of motion bilaterally in the upper extremities. EXTREMITIES:  No peripheral edema.   SKIN:  Clear with no obvious rashes or skin changes. No nail dyscrasia. NEURO:  Nonfocal. Well oriented.  Appropriate affect.    LAB RESULTS:  No results found for: SPEP  Lab Results  Component Value Date   WBC 4.7 05/09/2020   NEUTROABS 3.1 05/09/2020   HGB 10.5 (L) 05/09/2020   HCT 32.3 (L) 05/09/2020   MCV 94.2 05/09/2020   PLT 221 05/09/2020      Chemistry      Component Value Date/Time   NA 139 04/18/2020 1313   NA 143 12/07/2019 1742   NA 142 11/13/2016 1050   K 4.0 04/18/2020 1313   K 4.1 11/13/2016 1050   CL 110 04/18/2020 1313   CO2 20 (L) 04/18/2020 1313   CO2 27 11/13/2016 1050   BUN 33 (H) 04/18/2020 1313   BUN 13 12/07/2019 1742   BUN 11.5  11/13/2016 1050   CREATININE 1.26 (H) 04/18/2020 1313   CREATININE 0.86 01/30/2020 0946   CREATININE 1.0 11/13/2016 1050      Component Value Date/Time   CALCIUM 10.0 04/18/2020 1313   CALCIUM 9.8 11/13/2016 1050   ALKPHOS 192 (H) 04/18/2020 1313   ALKPHOS 140 11/13/2016 1050   AST 16 04/18/2020 1313   AST 12 (L) 01/30/2020 7616  AST 13 11/13/2016 1050   ALT 104 (H) 04/18/2020 1313   ALT 14 01/30/2020 0946   ALT 15 11/13/2016 1050   BILITOT 0.8 04/18/2020 1313   BILITOT 1.3 (H) 01/30/2020 0946   BILITOT 0.86 11/13/2016 1050       No results found for: LABCA2  No components found for: [id number]  No results for input(s): INR in the last 168 hours.  Urinalysis    Component Value Date/Time   COLORURINE YELLOW 04/05/2008 1120   APPEARANCEUR CLEAR 04/05/2008 1120   LABSPEC 1.021 04/05/2008 1120   PHURINE 6.0 04/05/2008 1120   GLUCOSEU NEGATIVE 04/05/2008 1120   HGBUR NEGATIVE 04/05/2008 1120   BILIRUBINUR negative 12/07/2019 1212   KETONESUR NEGATIVE 04/05/2008 1120   PROTEINUR Negative 12/07/2019 1212   PROTEINUR NEGATIVE 04/05/2008 1120   UROBILINOGEN 1.0 12/07/2019 1212   UROBILINOGEN 0.2 04/05/2008 1120   NITRITE negative 12/07/2019 1212   NITRITE NEGATIVE 04/05/2008 1120   LEUKOCYTESUR Negative 12/07/2019 1212    STUDIES: No results found.    ASSESSMENT: 69 y.o. BRCA negative [place] woman s/p left breast upper outer quadrant and left axillary lymph node biopsy 02/22/2014, both positive for a clinical T2 N2, stage IIIA invasive ductal carcinoma, grade 3, triple negative, with an MIB-1 of 90%  (1) neoadjuvant chemotherapy started 03/13/2014, with cyclophosphamide and doxorubicin in dose dense fashion x4, with Neulasta support on day 2, completed 04/24/2014, followed by weekly carboplatin and paclitaxel x12, paclitaxel reduced during cycle 5 and cycle 7 because of elevated bilirubin levels  (2) left lumpectomy and sentinel lymph node sampling 09/20/2014 showed  a complete pathologic response.  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  (b) PALB2 mutations are known to increase the risk of breast and pancreatic cancer, possibly other cancers, but the data is preliminary  (c) the patient's 2 daughters have been tested and did not carry the gene  (d) Sparkles's MSH2 VUS has been reclassified as "likely benign"  (e) she is status post remote TAH/BSO  (f) intensified screening, with mammography in February and breast MRI in August planned  (6) staging studies showed right-sided lung nodules, too small to characterize, and a dominant right-sided thyroid nodule unchanged in size as compared to 2011; to be followed  (a) chest CT 04/24/2016 showed the small pulmonary nodules to be completely stable, consistent with benign findings.  (b) thyroid ultrasound--done on 07/22/2018 showed enlarged heterogeneous lobular and multinodular thyroid gland, TI-RADS category 3 nodules, not meeting criteria for further biopsy or dedicated imaging f/u.    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  (8) anastrozole started 07/08/2018 for breast cancer prevention, discontinued 01/30/2020 with disease recurrence  (a) bone density scan 01/01/2018 normal, T score -0.6  (9) RECURRENT DISEASE May 2021  (a) left breast overlapping biopsy 01/20/2020 shows a clinical  T2 N0, stage IIB invasive ductal carcinoma, grade 2 or 3, with moderate estrogen receptor positivity, progesterone receptor negative, HER-2 not amplified, MIB-1 of 80%  (b) Left Mastectomy on 02/14/2020:  IDC, grade 3, 2.3 cm, margins negative, T2, NX.   (c) CT of the chest and bone scan on 02/08/2020 shows no distant  metastases  (10) started fulvestrant and palbociclib 02/24/2020 discontinued on 03/08/2020  (a) Oncotype sent on surgical sample shows score of 60 indicating a greater than 29% risk of recurrence on antiestrogens alone, and also predicting a significant benefit from chemotherapy.  (b) the Oncotype test found this tumor to be triple negative by single gene scores.   (11) CMF started on 03/27/2020, to be repeated every 21 days x 8   PLAN: Braylie continues on CMF and will proceed on cycle 3 today. Her labs are stable and I reviewed these with her in detail.  She will continue on therapy every 3 weeks.  She has mild nausea that is relieved with her prochlorperazine.  She will continue this.    Queen will continue with her activity level and walking around her house intermittently for exercise.  She was encouraged to pick fresh fruits and vegetables in her meals/snacks.  We will see her back in 3 weeks for labs, f/u, and cycle 4 of CMF.  She knows to call for any questions that may arise between now and her next appointment.  We are happy to see her sooner if needed.  Total encounter time 20 minutes.Wilber Bihari, NP 05/09/20 1:24 PM Medical Oncology and Hematology [facility] [place], [place] [address] Tel. [phone]    Fax. [phone]  *Total Encounter Time as defined by the Centers for Medicare and Medicaid Services includes, in addition to the face-to-face time of a patient visit (documented in the note above) non-face-to-face time: obtaining and reviewing outside history, ordering and reviewing medications, tests or procedures, care coordination (communications with other health care professionals or caregivers) and documentation in the medical record.

## 2020-05-09 NOTE — Patient Instructions (Signed)
[facility] Discharge Instructions for Patients Receiving Chemotherapy  Today you received the following chemotherapy agents: Cytoxan, methotrexate, fluorouracil   To help prevent nausea and vomiting after your treatment, we encourage you to take your nausea medication as directed.    If you develop nausea and vomiting that is not controlled by your nausea medication, call the clinic.   BELOW ARE SYMPTOMS THAT SHOULD BE REPORTED IMMEDIATELY:  *FEVER GREATER THAN 100.5 F  *CHILLS WITH OR WITHOUT FEVER  NAUSEA AND VOMITING THAT IS NOT CONTROLLED WITH YOUR NAUSEA MEDICATION  *UNUSUAL SHORTNESS OF BREATH  *UNUSUAL BRUISING OR BLEEDING  TENDERNESS IN MOUTH AND THROAT WITH OR WITHOUT PRESENCE OF ULCERS  *URINARY PROBLEMS  *BOWEL PROBLEMS  UNUSUAL RASH Items with * indicate a potential emergency and should be followed up as soon as possible.  Feel free to call the clinic should you have any questions or concerns. The clinic phone number is [phone].  Please show the [facility] at check-in to the Emergency Department and triage nurse.

## 2020-05-10 ENCOUNTER — Telehealth: Payer: Self-pay | Admitting: Adult Health

## 2020-05-10 NOTE — Telephone Encounter (Signed)
No 8/25 los. No changes made to pt's schedule.

## 2020-05-30 ENCOUNTER — Inpatient Hospital Stay: Payer: PPO

## 2020-05-30 ENCOUNTER — Encounter: Payer: Self-pay | Admitting: Adult Health

## 2020-05-30 ENCOUNTER — Other Ambulatory Visit: Payer: Self-pay

## 2020-05-30 ENCOUNTER — Telehealth: Payer: Self-pay | Admitting: *Deleted

## 2020-05-30 ENCOUNTER — Inpatient Hospital Stay (HOSPITAL_BASED_OUTPATIENT_CLINIC_OR_DEPARTMENT_OTHER): Payer: PPO | Admitting: Adult Health

## 2020-05-30 ENCOUNTER — Other Ambulatory Visit: Payer: Self-pay | Admitting: Oncology

## 2020-05-30 ENCOUNTER — Inpatient Hospital Stay: Payer: PPO | Attending: Oncology

## 2020-05-30 ENCOUNTER — Ambulatory Visit (HOSPITAL_COMMUNITY)
Admission: RE | Admit: 2020-05-30 | Discharge: 2020-05-30 | Disposition: A | Discharge status: Home / Self Care | Payer: PPO | Source: Ambulatory Visit | Attending: Adult Health | Admitting: Adult Health

## 2020-05-30 VITALS — HR 105

## 2020-05-30 VITALS — BP 100/45 | HR 119 | Temp 97.1°F | Resp 17 | Ht 61.6 in | Wt 240.5 lb

## 2020-05-30 DIAGNOSIS — C50412 Malignant neoplasm of upper-outer quadrant of left female breast: Secondary | ICD-10-CM

## 2020-05-30 DIAGNOSIS — Z171 Estrogen receptor negative status [ER-]: Secondary | ICD-10-CM

## 2020-05-30 DIAGNOSIS — C189 Malignant neoplasm of colon, unspecified: Secondary | ICD-10-CM | POA: Diagnosis not present

## 2020-05-30 DIAGNOSIS — Z5111 Encounter for antineoplastic chemotherapy: Secondary | ICD-10-CM | POA: Diagnosis not present

## 2020-05-30 DIAGNOSIS — R0602 Shortness of breath: Secondary | ICD-10-CM | POA: Diagnosis not present

## 2020-05-30 DIAGNOSIS — Z1509 Genetic susceptibility to other malignant neoplasm: Secondary | ICD-10-CM

## 2020-05-30 DIAGNOSIS — Z95828 Presence of other vascular implants and grafts: Secondary | ICD-10-CM

## 2020-05-30 DIAGNOSIS — C50912 Malignant neoplasm of unspecified site of left female breast: Secondary | ICD-10-CM | POA: Diagnosis not present

## 2020-05-30 DIAGNOSIS — R0600 Dyspnea, unspecified: Secondary | ICD-10-CM

## 2020-05-30 DIAGNOSIS — R0609 Other forms of dyspnea: Secondary | ICD-10-CM

## 2020-05-30 DIAGNOSIS — C50919 Malignant neoplasm of unspecified site of unspecified female breast: Secondary | ICD-10-CM | POA: Diagnosis not present

## 2020-05-30 DIAGNOSIS — C773 Secondary and unspecified malignant neoplasm of axilla and upper limb lymph nodes: Secondary | ICD-10-CM | POA: Diagnosis not present

## 2020-05-30 LAB — COMPREHENSIVE METABOLIC PANEL
ALT: 22 U/L (ref 0–44)
AST: 13 U/L — ABNORMAL LOW (ref 15–41)
Albumin: 3.2 g/dL — ABNORMAL LOW (ref 3.5–5.0)
Alkaline Phosphatase: 144 U/L — ABNORMAL HIGH (ref 38–126)
Anion gap: 9 (ref 5–15)
BUN: 18 mg/dL (ref 8–23)
CO2: 24 mmol/L (ref 22–32)
Calcium: 9.8 mg/dL (ref 8.9–10.3)
Chloride: 106 mmol/L (ref 98–111)
Creatinine, Ser: 1.22 mg/dL — ABNORMAL HIGH (ref 0.44–1.00)
GFR calc Af Amer: 52 mL/min — ABNORMAL LOW (ref >60)
GFR calc non Af Amer: 45 mL/min — ABNORMAL LOW (ref >60)
Glucose, Bld: 108 mg/dL — ABNORMAL HIGH (ref 70–99)
Potassium: 3.8 mmol/L (ref 3.5–5.1)
Sodium: 139 mmol/L (ref 135–145)
Total Bilirubin: 1.1 mg/dL (ref 0.3–1.2)
Total Protein: 6.9 g/dL (ref 6.5–8.1)

## 2020-05-30 LAB — CBC WITH DIFFERENTIAL/PLATELET
Abs Immature Granulocytes: 0.24 10*3/uL — ABNORMAL HIGH (ref 0.00–0.07)
Basophils Absolute: 0 10*3/uL (ref 0.0–0.1)
Basophils Relative: 1 %
Eosinophils Absolute: 0.1 10*3/uL (ref 0.0–0.5)
Eosinophils Relative: 1 %
HCT: 30.8 % — ABNORMAL LOW (ref 36.0–46.0)
Hemoglobin: 10.2 g/dL — ABNORMAL LOW (ref 12.0–15.0)
Immature Granulocytes: 4 %
Lymphocytes Relative: 16 %
Lymphs Abs: 0.9 10*3/uL (ref 0.7–4.0)
MCH: 30.9 pg (ref 26.0–34.0)
MCHC: 33.1 g/dL (ref 30.0–36.0)
MCV: 93.3 fL (ref 80.0–100.0)
Monocytes Absolute: 0.6 10*3/uL (ref 0.1–1.0)
Monocytes Relative: 10 %
Neutro Abs: 3.8 10*3/uL (ref 1.7–7.7)
Neutrophils Relative %: 68 %
Platelets: 252 10*3/uL (ref 150–400)
RBC: 3.3 MIL/uL — ABNORMAL LOW (ref 3.87–5.11)
RDW: 15 % (ref 11.5–15.5)
WBC: 5.7 10*3/uL (ref 4.0–10.5)
nRBC: 0.9 % — ABNORMAL HIGH (ref 0.0–0.2)

## 2020-05-30 MED ORDER — SODIUM CHLORIDE 0.9 % IV SOLN
Freq: Once | INTRAVENOUS | Status: AC
  Filled 2020-05-30: qty 250

## 2020-05-30 MED ORDER — SODIUM CHLORIDE 0.9% FLUSH
10.0000 mL | INTRAVENOUS | Status: DC | PRN
  Administered 2020-05-30: 10 mL via INTRAVENOUS
  Filled 2020-05-30: qty 10

## 2020-05-30 MED ORDER — SODIUM CHLORIDE 0.9 % IV SOLN
INTRAVENOUS | Status: AC
  Filled 2020-05-30: qty 250

## 2020-05-30 MED ORDER — PALONOSETRON HCL INJECTION 0.25 MG/5ML
INTRAVENOUS | Status: AC
  Filled 2020-05-30: qty 5

## 2020-05-30 MED ORDER — SODIUM CHLORIDE 0.9% FLUSH
10.0000 mL | INTRAVENOUS | Status: DC | PRN
  Administered 2020-05-30: 10 mL
  Filled 2020-05-30: qty 10

## 2020-05-30 MED ORDER — HEPARIN SOD (PORK) LOCK FLUSH 100 UNIT/ML IV SOLN
500.0000 [IU] | Freq: Once | INTRAVENOUS | Status: AC | PRN
  Administered 2020-05-30: 500 [IU]
  Filled 2020-05-30: qty 5

## 2020-05-30 MED ORDER — METHOTREXATE SODIUM (PF) CHEMO INJECTION 250 MG/10ML
40.0000 mg/m2 | Freq: Once | INTRAMUSCULAR | Status: AC
  Administered 2020-05-30: 86 mg via INTRAVENOUS
  Filled 2020-05-30: qty 3.44

## 2020-05-30 MED ORDER — SODIUM CHLORIDE 0.9 % IV SOLN
600.0000 mg/m2 | Freq: Once | INTRAVENOUS | Status: AC
  Administered 2020-05-30: 1300 mg via INTRAVENOUS
  Filled 2020-05-30: qty 65

## 2020-05-30 MED ORDER — SODIUM CHLORIDE 0.9 % IV SOLN
10.0000 mg | Freq: Once | INTRAVENOUS | Status: AC
  Administered 2020-05-30: 10 mg via INTRAVENOUS
  Filled 2020-05-30: qty 10

## 2020-05-30 MED ORDER — FLUOROURACIL CHEMO INJECTION 2.5 GM/50ML
600.0000 mg/m2 | Freq: Once | INTRAVENOUS | Status: AC
  Administered 2020-05-30: 1300 mg via INTRAVENOUS
  Filled 2020-05-30: qty 26

## 2020-05-30 MED ORDER — PALONOSETRON HCL INJECTION 0.25 MG/5ML
0.2500 mg | Freq: Once | INTRAVENOUS | Status: AC
  Administered 2020-05-30: 0.25 mg via INTRAVENOUS

## 2020-05-30 NOTE — Progress Notes (Signed)
[place]  [phone] [phone]     ID: Taurus Willis DOB: 01/20/1951  MR#: [id number]  XLK#:[id number]  Patient Care Team: Minette Brine, [place] as PCP - General (General Practice) Magrinat, Virgie Dad, MD as Consulting Physician (Oncology) Rolm Bookbinder, MD as Consulting Physician (General Surgery) Gery Pray, MD as Consulting Physician (Radiation Oncology) [name], MD as Consulting Physician (Ophthalmology)   CHIEF COMPLAINT: triple negative breast cancer; PALB2 positive (s/p left mastectomy)  CURRENT TREATMENT: CMF   INTERVAL HISTORY: Shavell returns today for follow up and treatment of her now-recurrent left breast cancer.  She started on adjuvant CMF chemotherapy on 03/27/2020.  Today is cycle 4 of her chemotherapy.  She continues to tolerate her treatment well.  She has mild nausea initially following treatment that resolves after a couple of days.  She manages this with her anti nausea medications.  REVIEW OF SYSTEMS: Dail has no new issues today.  She continues to remain active by walking in her home.  She has no fever, chills, cough, shortness of breath, chest pain, palpitations, cough, bowel/bladder changes, headaches, vision issues or any other concerns.  A detailed ROS was otherwise non contributory.    BREAST CANCER HISTORY: From the original intake note:  Keiana herself palpated a mass in her left breast, and immediately brought it to the attention of her primary care physician, Dr. Baird Cancer, who confirmed the finding and setup the patient for bilateral diagnostic mammography at the breast Center 02/20/2014. This showed a high density mass in the outer left breast measuring up to 3.7 cm. It corresponded to the site of palpable concern. In addition the normal 4 logically abnormal lymph nodes in the left axilla. The mass was palpable by exam, and by ultrasonography measured 2.6 cm. There were enlarged and morphologically  abnormal lymph nodes in the left axilla, largest measuring 3 cm.  Biopsy of the left breast mass in question and 1 of the abnormal axillary lymph nodes 02/22/2014 showed ([id number]) biopsies to be positive for an invasive ductal carcinoma, grade 3, triple negative, with an MIB-1 of 90%. Specifically the HER-2 signals ratio was 1.05, and the number per cell 2.00.  MRI of the breasts 03/01/2014 showed a 3.8 cm enhancing mass with areas of apparent necrosis in the left breast, as well as the biopsy tract extending laterally, the combined measure 5.3 cm. There were no other masses or areas of abnormal enhancement. There were multiple abnormally enlarged left axillary lymph nodes with loss of the normal fatty hilum. These included level I and retropectoral lymph nodes.  The patient's subsequent history is as detailed below   PAST MEDICAL HISTORY: Past Medical History:  Diagnosis Date   Colon cancer ([place])    Hx of rotator cuff surgery    Hypertension    left breast cancer    Personal history of chemotherapy 2016   left   Personal history of radiation therapy 2016   left breast    Radiation 11/15/14-01/02/15   left breast, axillary and supraclavicular region 45 gray, lumpectomy cavity boosted to 16 gray    PAST SURGICAL HISTORY: Past Surgical History:  Procedure Laterality Date   ABDOMINAL HYSTERECTOMY  1988   BREAST EXCISIONAL BIOPSY Right 12/21/2017   BREAST LUMPECTOMY Left 2016   COLON SURGERY  1988   colectomy/colostomy-ca   COLON SURGERY  1988   colostomy takedown-reversal   COLONOSCOPY     EXCISION / BIOPSY BREAST / NIPPLE / DUCT Left 09/20/2014   IR  IMAGING GUIDED PORT INSERTION  03/22/2020   MASTECTOMY Left 02/14/2020   MASTECTOMY W/ SENTINEL NODE BIOPSY Left 02/14/2020   Procedure: LEFT MASTECTOMY WITH BLUE DYE INJECTION;  Surgeon: Rolm Bookbinder, MD;  Location: [facility];  Service: General;  Laterality: Left;  PEC BLOCK   RADIOACTIVE SEED GUIDED EXCISIONAL  BREAST BIOPSY Right 12/21/2017   Procedure: RIGHT RADIOACTIVE SEED GUIDED EXCISIONAL BREAST BIOPSY ERAS PATHWAY;  Surgeon: Rolm Bookbinder, MD;  Location: [facility];  Service: General;  Laterality: Right;   RADIOACTIVE SEED GUIDED PARTIAL MASTECTOMY WITH AXILLARY SENTINEL LYMPH NODE BIOPSY Left 09/20/2014   Procedure: RADIOACTIVE SEED GUIDED LEFT BREAST LUMPECTOMY WITH AXILLARY SENTINEL LYMPH NODE BIOPSY;  Surgeon: Rolm Bookbinder, MD;  Location: [facility];  Service: General;  Laterality: Left;   TOTAL SHOULDER ARTHROPLASTY  2009   right    FAMILY HISTORY Family History  Problem Relation Age of Onset   Cancer Mother 61       breast   Breast cancer Mother        early 32s   Stroke Father 74       cause of death   Diabetes Father    Cancer Maternal Aunt        breast cancer at unknown age   Breast cancer Maternal Aunt    Ovarian cancer Neg Hx    the patient's father died at the age of 41 following a stroke in the setting of diabetes; the patient's mother is living at 34. The patient has 3 brothers and 2 sisters. [name], [name], was diagnosed with breast cancer in her early 85s. There is no other breast or ovarian cancer in the family. The patient herself was diagnosed with watts by history he is an incidentally found stage I colon carcinoma noted at the time of her hysterectomy. She had a partial colectomy but no adjuvant treatment. We have no records regarding that procedure   GYNECOLOGIC HISTORY:  No LMP recorded. Patient has had a hysterectomy. Menarche age 69, first live birth age 81, the patient underwent total abdominal hysterectomy with bilateral salpingo-oophorectomy in her 23s. She did not take hormone replacement. She did not take oral contraceptives at any point.   SOCIAL HISTORY: (Updated May 2021 Mariane worked for [employer] for about 40 years, retiring in 2008. She is divorced and lives alone, with no pets.  Her  grandson who is a Conservator, museum/gallery frequently stays with her.  Her daughter Wenda Vanschaick works for [employer] in Press photographer. The second daughter, Mairyn Lenahan, works as a Scientist, water quality at [employer].  The patient has 6 grandchildren and three great-grandchildren. She attends a [employer]. (updated 09/28/2018)    ADVANCED DIRECTIVES: Not in place. The patient intends to name her daughter Marcello Fennel. her healthcare power of attorney. [name] is work number is [phone]. On the patient's 03/01/2014 visit she was given a copy of the appropriate documents to complete and notarize at her discretion.     HEALTH MAINTENANCE: Social History   Tobacco Use   Smoking status: Never Smoker   Smokeless tobacco: Never Used  Vaping Use   Vaping Use: Never used  Substance Use Topics   Alcohol use: Not Currently    Comment: occ-rare   Drug use: No     Colonoscopy: 2000  PAP: May 2015  Bone density: 12/21/2017, T-score of -0.6  Lipid panel: 02/03/2014  No Known Allergies  Current Outpatient Medications  Medication Sig Dispense Refill   acetaminophen (  TYLENOL) 500 MG tablet Take 1,000 mg by mouth every 6 (six) hours as needed for mild pain or moderate pain.     dexamethasone (DECADRON) 4 MG tablet Take 1 tablet (4 mg total) by mouth daily. Start the day after chemotherapy for 2 days. Take with food. 30 tablet 1   gabapentin (NEURONTIN) 300 MG capsule Take 1 capsule (300 mg total) by mouth at bedtime. 90 capsule 4   hydroxypropyl methylcellulose / hypromellose (ISOPTO TEARS / GONIOVISC) 2.5 % ophthalmic solution Place 1 drop into both eyes daily as needed for dry eyes.     lidocaine-prilocaine (EMLA) cream Apply to affected area once 30 g 3   Liniments (SALONPAS ARTHRITIS PAIN RELIEF EX) Apply 1 application topically daily as needed (knee pain).     lisinopril-hydrochlorothiazide (ZESTORETIC) 20-12.5 MG tablet Take 1/2 tablet daily by mouth (Patient taking differently: Take 1  tablet by mouth daily. ) 30 tablet 6   Multiple Vitamins-Minerals (AIRBORNE PO) Take 1 tablet by mouth 2 (two) times a week.     prochlorperazine (COMPAZINE) 10 MG tablet Take 1 tablet (10 mg total) by mouth every 6 (six) hours as needed (Nausea or vomiting). 30 tablet 1   ursodiol (ACTIGALL) 500 MG tablet Take 500 mg by mouth 3 (three) times daily.      No current facility-administered medications for this visit.    OBJECTIVE: Vitals:   05/30/20 1312  BP: (!) 100/45  Pulse: (!) 119  Resp: 17  Temp: (!) 97.1 F (36.2 C)  SpO2: 100%    Body mass index is 44.56 kg/m.     ECOG FS:1 - Symptomatic but completely ambulatory GENERAL: Patient is a well appearing female in no acute distress HEENT:  Sclerae anicteric.  Mask in place. Neck is supple.  NODES:  No cervical, supraclavicular, or axillary lymphadenopathy palpated.  BREAST EXAM:  Deferred LUNGS:  Clear to auscultation bilaterally.  No wheezes or rhonchi. HEART:  Regular rate and rhythm. No murmur appreciated. ABDOMEN:  Soft, nontender.  Positive, normoactive bowel sounds. No organomegaly palpated. MSK:  No focal spinal tenderness to palpation. Full range of motion bilaterally in the upper extremities. EXTREMITIES:  No peripheral edema.   SKIN:  Clear with no obvious rashes or skin changes. No nail dyscrasia. NEURO:  Nonfocal. Well oriented.  Appropriate affect.    LAB RESULTS:  No results found for: SPEP  Lab Results  Component Value Date   WBC 5.7 05/30/2020   NEUTROABS 3.8 05/30/2020   HGB 10.2 (L) 05/30/2020   HCT 30.8 (L) 05/30/2020   MCV 93.3 05/30/2020   PLT 252 05/30/2020      Chemistry      Component Value Date/Time   NA 138 05/09/2020 1247   NA 143 12/07/2019 1742   NA 142 11/13/2016 1050   K 4.2 05/09/2020 1247   K 4.1 11/13/2016 1050   CL 104 05/09/2020 1247   CO2 26 05/09/2020 1247   CO2 27 11/13/2016 1050   BUN 15 05/09/2020 1247   BUN 13 12/07/2019 1742   BUN 11.5 11/13/2016 1050    CREATININE 1.13 (H) 05/09/2020 1247   CREATININE 0.86 01/30/2020 0946   CREATININE 1.0 11/13/2016 1050      Component Value Date/Time   CALCIUM 9.8 05/09/2020 1247   CALCIUM 9.8 11/13/2016 1050   ALKPHOS 161 (H) 05/09/2020 1247   ALKPHOS 140 11/13/2016 1050   AST 15 05/09/2020 1247   AST 12 (L) 01/30/2020 0946   AST 13 11/13/2016 1050  ALT 45 (H) 05/09/2020 1247   ALT 14 01/30/2020 0946   ALT 15 11/13/2016 1050   BILITOT 0.8 05/09/2020 1247   BILITOT 1.3 (H) 01/30/2020 0946   BILITOT 0.86 11/13/2016 1050       No results found for: LABCA2  No components found for: RKYHC623  No results for input(s): INR in the last 168 hours.  Urinalysis    Component Value Date/Time   COLORURINE YELLOW 04/05/2008 1120   APPEARANCEUR CLEAR 04/05/2008 1120   LABSPEC 1.021 04/05/2008 1120   PHURINE 6.0 04/05/2008 1120   GLUCOSEU NEGATIVE 04/05/2008 1120   HGBUR NEGATIVE 04/05/2008 1120   BILIRUBINUR negative 12/07/2019 1212   KETONESUR NEGATIVE 04/05/2008 1120   PROTEINUR Negative 12/07/2019 1212   PROTEINUR NEGATIVE 04/05/2008 1120   UROBILINOGEN 1.0 12/07/2019 1212   UROBILINOGEN 0.2 04/05/2008 1120   NITRITE negative 12/07/2019 1212   NITRITE NEGATIVE 04/05/2008 1120   LEUKOCYTESUR Negative 12/07/2019 1212    STUDIES: No results found.    ASSESSMENT: 69 y.o. BRCA negative [place] woman s/p left breast upper outer quadrant and left axillary lymph node biopsy 02/22/2014, both positive for a clinical T2 N2, stage IIIA invasive ductal carcinoma, grade 3, triple negative, with an MIB-1 of 90%  (1) neoadjuvant chemotherapy started 03/13/2014, with cyclophosphamide and doxorubicin in dose dense fashion x4, with Neulasta support on day 2, completed 04/24/2014, followed by weekly carboplatin and paclitaxel x12, paclitaxel reduced during cycle 5 and cycle 7 because of elevated bilirubin levels  (2) left lumpectomy and sentinel lymph node sampling 09/20/2014 showed a complete  pathologic response.  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  (b) PALB2 mutations are known to increase the risk of breast and pancreatic cancer, possibly other cancers, but the data is preliminary  (c) the patient's 2 daughters have been tested and did not carry the gene  (d) Perlie's MSH2 VUS has been reclassified as "likely benign"  (e) she is status post remote TAH/BSO  (f) intensified screening, with mammography in February and breast MRI in August planned  (6) staging studies showed right-sided lung nodules, too small to characterize, and a dominant right-sided thyroid nodule unchanged in size as compared to 2011; to be followed  (a) chest CT 04/24/2016 showed the small pulmonary nodules to be completely stable, consistent with benign findings.  (b) thyroid ultrasound--done on 07/22/2018 showed enlarged heterogeneous lobular and multinodular thyroid gland, TI-RADS category 3 nodules, not meeting criteria for further biopsy or dedicated imaging f/u.    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  (8) anastrozole started 07/08/2018 for breast cancer prevention, discontinued 01/30/2020 with disease recurrence  (a) bone density scan 01/01/2018 normal, T score -0.6  (9) RECURRENT DISEASE May 2021  (a) left breast overlapping biopsy 01/20/2020 shows a clinical  T2 N0, stage IIB invasive ductal carcinoma, grade 2 or 3, with moderate estrogen receptor positivity, progesterone receptor negative, HER-2 not amplified, MIB-1 of 80%  (b) Left Mastectomy on 02/14/2020: IDC, grade  3, 2.3 cm, margins negative, T2, NX.   (c) CT of the chest and bone scan on 02/08/2020 shows no distant metastases  (10) started fulvestrant and  palbociclib 02/24/2020 discontinued on 03/08/2020  (a) Oncotype sent on surgical sample shows score of 60 indicating a greater than 29% risk of recurrence on antiestrogens alone, and also predicting a significant benefit from chemotherapy.  (b) the Oncotype test found this tumor to be triple negative by single gene scores.   (11) CMF started on 03/27/2020, to be repeated every 21 days x 8   PLAN: Kaiah continues on therapy with CMF.  Her labs are stable today and she is ready to proceed with her fourth cycle of CMF.  She denies the need of any anti emetic refills.    Mikalah and I talked about healthy diet and activity level.  I reviewed her labs with her in detail.   We will see her back in 3 weeks for labs, f/u, and her fifth cycle of CMF.  She was recommended to continue with the appropriate pandemic precautions. She knows to call for any questions that may arise between now and her next appointment.  We are happy to see her sooner if needed.  Total encounter time 20 minutes.Wilber Bihari, NP 05/30/20 1:19 PM Medical Oncology and Hematology [facility] [place], [place] [address] Tel. [phone]    Fax. [phone]  *Total Encounter Time as defined by the Centers for Medicare and Medicaid Services includes, in addition to the face-to-face time of a patient visit (documented in the note above) non-face-to-face time: obtaining and reviewing outside history, ordering and reviewing medications, tests or procedures, care coordination (communications with other health care professionals or caregivers) and documentation in the medical record.

## 2020-05-30 NOTE — Patient Instructions (Signed)
[facility] Discharge Instructions for Patients Receiving Chemotherapy  Today you received the following chemotherapy agents: Cytoxan, methotrexate, 5FU  To help prevent nausea and vomiting after your treatment, we encourage you to take your nausea medication as directed.    If you develop nausea and vomiting that is not controlled by your nausea medication, call the clinic.   BELOW ARE SYMPTOMS THAT SHOULD BE REPORTED IMMEDIATELY:  *FEVER GREATER THAN 100.5 F  *CHILLS WITH OR WITHOUT FEVER  NAUSEA AND VOMITING THAT IS NOT CONTROLLED WITH YOUR NAUSEA MEDICATION  *UNUSUAL SHORTNESS OF BREATH  *UNUSUAL BRUISING OR BLEEDING  TENDERNESS IN MOUTH AND THROAT WITH OR WITHOUT PRESENCE OF ULCERS  *URINARY PROBLEMS  *BOWEL PROBLEMS  UNUSUAL RASH Items with * indicate a potential emergency and should be followed up as soon as possible.  Feel free to call the clinic should you have any questions or concerns. The clinic phone number is [phone].  Please show the CHEMO ALERT CARD at check-in to the Emergency Department and triage nurse.   

## 2020-05-30 NOTE — Telephone Encounter (Signed)
Ok to treat despite pulse of 105 per MD.

## 2020-05-30 NOTE — Patient Instructions (Signed)

## 2020-05-31 ENCOUNTER — Telehealth: Payer: Self-pay | Admitting: Adult Health

## 2020-05-31 NOTE — Telephone Encounter (Signed)
No 9/16 los. No changes made to pt's schedule.

## 2020-06-07 ENCOUNTER — Other Ambulatory Visit: Payer: Self-pay | Admitting: Adult Health

## 2020-06-07 DIAGNOSIS — C50412 Malignant neoplasm of upper-outer quadrant of left female breast: Secondary | ICD-10-CM

## 2020-06-11 ENCOUNTER — Other Ambulatory Visit: Payer: Self-pay | Admitting: *Deleted

## 2020-06-11 ENCOUNTER — Ambulatory Visit: Payer: PPO | Admitting: Nurse Practitioner

## 2020-06-11 DIAGNOSIS — C50412 Malignant neoplasm of upper-outer quadrant of left female breast: Secondary | ICD-10-CM

## 2020-06-11 MED ORDER — DEXAMETHASONE 4 MG PO TABS: ORAL_TABLET | ORAL | 1 refills | Status: DC

## 2020-06-18 ENCOUNTER — Ambulatory Visit: Payer: PPO | Admitting: Adult Health

## 2020-06-18 ENCOUNTER — Other Ambulatory Visit: Payer: PPO

## 2020-06-20 ENCOUNTER — Inpatient Hospital Stay: Payer: PPO

## 2020-06-20 ENCOUNTER — Inpatient Hospital Stay: Payer: PPO | Attending: Oncology

## 2020-06-20 ENCOUNTER — Other Ambulatory Visit: Payer: Self-pay

## 2020-06-20 ENCOUNTER — Inpatient Hospital Stay (HOSPITAL_BASED_OUTPATIENT_CLINIC_OR_DEPARTMENT_OTHER): Payer: PPO | Admitting: Adult Health

## 2020-06-20 ENCOUNTER — Encounter: Payer: Self-pay | Admitting: Adult Health

## 2020-06-20 ENCOUNTER — Other Ambulatory Visit: Payer: Self-pay | Admitting: Adult Health

## 2020-06-20 VITALS — BP 135/89 | HR 99 | Temp 97.5°F | Resp 18 | Ht 61.6 in | Wt 243.4 lb

## 2020-06-20 DIAGNOSIS — Z5111 Encounter for antineoplastic chemotherapy: Secondary | ICD-10-CM | POA: Diagnosis not present

## 2020-06-20 DIAGNOSIS — C773 Secondary and unspecified malignant neoplasm of axilla and upper limb lymph nodes: Secondary | ICD-10-CM | POA: Diagnosis not present

## 2020-06-20 DIAGNOSIS — C50412 Malignant neoplasm of upper-outer quadrant of left female breast: Secondary | ICD-10-CM

## 2020-06-20 DIAGNOSIS — C50919 Malignant neoplasm of unspecified site of unspecified female breast: Secondary | ICD-10-CM

## 2020-06-20 DIAGNOSIS — Z95828 Presence of other vascular implants and grafts: Secondary | ICD-10-CM

## 2020-06-20 DIAGNOSIS — R0609 Other forms of dyspnea: Secondary | ICD-10-CM

## 2020-06-20 DIAGNOSIS — I10 Essential (primary) hypertension: Secondary | ICD-10-CM | POA: Diagnosis not present

## 2020-06-20 DIAGNOSIS — Z171 Estrogen receptor negative status [ER-]: Secondary | ICD-10-CM | POA: Diagnosis not present

## 2020-06-20 DIAGNOSIS — R0600 Dyspnea, unspecified: Secondary | ICD-10-CM

## 2020-06-20 DIAGNOSIS — Z1509 Genetic susceptibility to other malignant neoplasm: Secondary | ICD-10-CM

## 2020-06-20 DIAGNOSIS — C50912 Malignant neoplasm of unspecified site of left female breast: Secondary | ICD-10-CM | POA: Diagnosis not present

## 2020-06-20 LAB — COMPREHENSIVE METABOLIC PANEL
ALT: 57 U/L — ABNORMAL HIGH (ref 0–44)
AST: 18 U/L (ref 15–41)
Albumin: 3.1 g/dL — ABNORMAL LOW (ref 3.5–5.0)
Alkaline Phosphatase: 138 U/L — ABNORMAL HIGH (ref 38–126)
Anion gap: 10 (ref 5–15)
BUN: 11 mg/dL (ref 8–23)
CO2: 27 mmol/L (ref 22–32)
Calcium: 10 mg/dL (ref 8.9–10.3)
Chloride: 103 mmol/L (ref 98–111)
Creatinine, Ser: 0.87 mg/dL (ref 0.44–1.00)
GFR calc non Af Amer: >60 mL/min (ref >60)
Glucose, Bld: 109 mg/dL — ABNORMAL HIGH (ref 70–99)
Potassium: 4 mmol/L (ref 3.5–5.1)
Sodium: 140 mmol/L (ref 135–145)
Total Bilirubin: 1 mg/dL (ref 0.3–1.2)
Total Protein: 6.8 g/dL (ref 6.5–8.1)

## 2020-06-20 LAB — CBC WITH DIFFERENTIAL/PLATELET
Abs Immature Granulocytes: 0.35 10*3/uL — ABNORMAL HIGH (ref 0.00–0.07)
Basophils Absolute: 0.1 10*3/uL (ref 0.0–0.1)
Basophils Relative: 1 %
Eosinophils Absolute: 0 10*3/uL (ref 0.0–0.5)
Eosinophils Relative: 0 %
HCT: 30.6 % — ABNORMAL LOW (ref 36.0–46.0)
Hemoglobin: 10.1 g/dL — ABNORMAL LOW (ref 12.0–15.0)
Immature Granulocytes: 4 %
Lymphocytes Relative: 11 %
Lymphs Abs: 1 10*3/uL (ref 0.7–4.0)
MCH: 31.9 pg (ref 26.0–34.0)
MCHC: 33 g/dL (ref 30.0–36.0)
MCV: 96.5 fL (ref 80.0–100.0)
Monocytes Absolute: 1 10*3/uL (ref 0.1–1.0)
Monocytes Relative: 10 %
Neutro Abs: 7.2 10*3/uL (ref 1.7–7.7)
Neutrophils Relative %: 74 %
Platelets: 274 10*3/uL (ref 150–400)
RBC: 3.17 MIL/uL — ABNORMAL LOW (ref 3.87–5.11)
RDW: 15.1 % (ref 11.5–15.5)
WBC: 9.6 10*3/uL (ref 4.0–10.5)
nRBC: 2.2 % — ABNORMAL HIGH (ref 0.0–0.2)

## 2020-06-20 MED ORDER — LISINOPRIL-HYDROCHLOROTHIAZIDE 20-12.5 MG PO TABS: ORAL_TABLET | ORAL | 6 refills | Status: DC

## 2020-06-20 MED ORDER — SODIUM CHLORIDE 0.9% FLUSH
10.0000 mL | INTRAVENOUS | Status: DC | PRN
  Administered 2020-06-20: 10 mL
  Filled 2020-06-20: qty 10

## 2020-06-20 MED ORDER — SODIUM CHLORIDE 0.9 % IV SOLN
600.0000 mg/m2 | Freq: Once | INTRAVENOUS | Status: AC
  Administered 2020-06-20: 1300 mg via INTRAVENOUS
  Filled 2020-06-20: qty 65

## 2020-06-20 MED ORDER — SODIUM CHLORIDE 0.9% FLUSH
10.0000 mL | Freq: Once | INTRAVENOUS | Status: AC
  Administered 2020-06-20: 10 mL
  Filled 2020-06-20: qty 10

## 2020-06-20 MED ORDER — HEPARIN SOD (PORK) LOCK FLUSH 100 UNIT/ML IV SOLN
500.0000 [IU] | Freq: Once | INTRAVENOUS | Status: AC | PRN
  Administered 2020-06-20: 500 [IU]
  Filled 2020-06-20: qty 5

## 2020-06-20 MED ORDER — PALONOSETRON HCL INJECTION 0.25 MG/5ML
INTRAVENOUS | Status: AC
  Filled 2020-06-20: qty 5

## 2020-06-20 MED ORDER — METHOTREXATE SODIUM (PF) CHEMO INJECTION 250 MG/10ML
40.0000 mg/m2 | Freq: Once | INTRAMUSCULAR | Status: AC
  Administered 2020-06-20: 86 mg via INTRAVENOUS
  Filled 2020-06-20: qty 3.44

## 2020-06-20 MED ORDER — FLUOROURACIL CHEMO INJECTION 2.5 GM/50ML
600.0000 mg/m2 | Freq: Once | INTRAVENOUS | Status: AC
  Administered 2020-06-20: 1300 mg via INTRAVENOUS
  Filled 2020-06-20: qty 26

## 2020-06-20 MED ORDER — PALONOSETRON HCL INJECTION 0.25 MG/5ML
0.2500 mg | Freq: Once | INTRAVENOUS | Status: AC
  Administered 2020-06-20: 0.25 mg via INTRAVENOUS

## 2020-06-20 MED ORDER — SODIUM CHLORIDE 0.9 % IV SOLN
10.0000 mg | Freq: Once | INTRAVENOUS | Status: AC
  Administered 2020-06-20: 10 mg via INTRAVENOUS
  Filled 2020-06-20: qty 10

## 2020-06-20 MED ORDER — SODIUM CHLORIDE 0.9 % IV SOLN
Freq: Once | INTRAVENOUS | Status: AC
  Filled 2020-06-20: qty 250

## 2020-06-20 MED FILL — LISINOPRIL-HCTZ 20-12.5 MG: 20-12.5 | 90 days supply | Qty: 45 | Fill #0

## 2020-06-20 NOTE — Progress Notes (Signed)
[place]  [phone] [phone]     ID: [name] DOB: July 20, 1951  MR#: [id number]  NUU#:[id number]  Patient Care Team: Minette Brine, [place] as PCP - General (General Practice) Magrinat, Virgie Dad, MD as Consulting Physician (Oncology) Rolm Bookbinder, MD as Consulting Physician (General Surgery) Gery Pray, MD as Consulting Physician (Radiation Oncology) [name], MD as Consulting Physician (Ophthalmology)   CHIEF COMPLAINT: triple negative breast cancer; PALB2 positive (s/p left mastectomy)  CURRENT TREATMENT: CMF   INTERVAL HISTORY: [name] returns today for follow up and treatment of her now-recurrent left breast cancer.  She started on adjuvant CMF chemotherapy given on day 1 of a 21 day cycle.  She is here for her fifth cycle of treatment.  She tolerates this well with no issues.    REVIEW OF SYSTEMS: [name] has been walking around more and her shortness of breath has improved.  She denies any new issues.  She has no fever, chills, chest pain, palpitations, cough, bowel/bladder changes, nausea, vomiting, mucositis or any other concerns.  A detailed ROS Was otherwise non contributory.    BREAST CANCER HISTORY: From the original intake note:  Yesmin herself palpated a mass in her left breast, and immediately brought it to the attention of her primary care physician, Dr. Baird Cancer, who confirmed the finding and setup the patient for bilateral diagnostic mammography at the breast Center 02/20/2014. This showed a high density mass in the outer left breast measuring up to 3.7 cm. It corresponded to the site of palpable concern. In addition the normal 4 logically abnormal lymph nodes in the left axilla. The mass was palpable by exam, and by ultrasonography measured 2.6 cm. There were enlarged and morphologically abnormal lymph nodes in the left axilla, largest measuring 3 cm.  Biopsy of the left breast mass in question and 1 of the  abnormal axillary lymph nodes 02/22/2014 showed ([id number]) biopsies to be positive for an invasive ductal carcinoma, grade 3, triple negative, with an MIB-1 of 90%. Specifically the HER-2 signals ratio was 1.05, and the number per cell 2.00.  MRI of the breasts 03/01/2014 showed a 3.8 cm enhancing mass with areas of apparent necrosis in the left breast, as well as the biopsy tract extending laterally, the combined measure 5.3 cm. There were no other masses or areas of abnormal enhancement. There were multiple abnormally enlarged left axillary lymph nodes with loss of the normal fatty hilum. These included level I and retropectoral lymph nodes.  The patient's subsequent history is as detailed below   PAST MEDICAL HISTORY: Past Medical History:  Diagnosis Date  . Colon cancer ([place])   . Hx of rotator cuff surgery   . Hypertension   . left breast cancer   . Personal history of chemotherapy 2016   left  . Personal history of radiation therapy 2016   left breast   . Radiation 11/15/14-01/02/15   left breast, axillary and supraclavicular region 45 gray, lumpectomy cavity boosted to 16 gray    PAST SURGICAL HISTORY: Past Surgical History:  Procedure Laterality Date  . ABDOMINAL HYSTERECTOMY  1988  . BREAST EXCISIONAL BIOPSY Right 12/21/2017  . BREAST LUMPECTOMY Left 2016  . COLON SURGERY  1988   colectomy/colostomy-ca  . COLON SURGERY  1988   colostomy takedown-reversal  . COLONOSCOPY    . EXCISION / BIOPSY BREAST / NIPPLE / DUCT Left 09/20/2014  . IR IMAGING GUIDED PORT INSERTION  03/22/2020  . MASTECTOMY Left 02/14/2020  . MASTECTOMY W/  SENTINEL NODE BIOPSY Left 02/14/2020   Procedure: LEFT MASTECTOMY WITH BLUE DYE INJECTION;  Surgeon: Rolm Bookbinder, MD;  Location: [place];  Service: General;  Laterality: Left;  PEC BLOCK  . RADIOACTIVE SEED GUIDED EXCISIONAL BREAST BIOPSY Right 12/21/2017   Procedure: RIGHT RADIOACTIVE SEED GUIDED EXCISIONAL BREAST BIOPSY ERAS PATHWAY;  Surgeon:  Rolm Bookbinder, MD;  Location: [place];  Service: General;  Laterality: Right;  . RADIOACTIVE SEED GUIDED PARTIAL MASTECTOMY WITH AXILLARY SENTINEL LYMPH NODE BIOPSY Left 09/20/2014   Procedure: RADIOACTIVE SEED GUIDED LEFT BREAST LUMPECTOMY WITH AXILLARY SENTINEL LYMPH NODE BIOPSY;  Surgeon: Rolm Bookbinder, MD;  Location: [facility];  Service: General;  Laterality: Left;  . TOTAL SHOULDER ARTHROPLASTY  2009   right    FAMILY HISTORY Family History  Problem Relation Age of Onset  . Cancer Mother 42       breast  . Breast cancer Mother        early 73s  . Stroke Father 42       cause of death  . Diabetes Father   . Cancer Maternal Aunt        breast cancer at unknown age  . Breast cancer Maternal Aunt   . Ovarian cancer Neg Hx    the patient's father died at the age of 22 following a stroke in the setting of diabetes; the patient's mother is living at 69. The patient has 3 brothers and 2 sisters. [name], [name], was diagnosed with breast cancer in her early 69s. There is no other breast or ovarian cancer in the family. The patient herself was diagnosed with watts by history he is an incidentally found stage I colon carcinoma noted at the time of her hysterectomy. She had a partial colectomy but no adjuvant treatment. We have no records regarding that procedure   GYNECOLOGIC HISTORY:  No LMP recorded. Patient has had a hysterectomy. Menarche age 69, first live birth age 69, the patient underwent total abdominal hysterectomy with bilateral salpingo-oophorectomy in her 40s. She did not take hormone replacement. She did not take oral contraceptives at any point.   SOCIAL HISTORY: (Updated May 2021 Ligia worked for [employer] for about 40 years, retiring in 2008. She is divorced and lives alone, with no pets.  Her grandson who is a Conservator, museum/gallery frequently stays with her.  Her daughter Makeila [name] works for [employer] in  Press photographer. The second daughter, [name], works as a Scientist, water quality at [employer].  The patient has 6 grandchildren and three great-grandchildren. She attends a [employer]. (updated 09/28/2018)    ADVANCED DIRECTIVES: Not in place. The patient intends to name her daughter Marcello Fennel. her healthcare power of attorney. [name] is work number is [phone]. On the patient's 03/01/2014 visit she was given a copy of the appropriate documents to complete and notarize at her discretion.     HEALTH MAINTENANCE: Social History   Tobacco Use  . Smoking status: Never Smoker  . Smokeless tobacco: Never Used  Vaping Use  . Vaping Use: Never used  Substance Use Topics  . Alcohol use: Not Currently    Comment: occ-rare  . Drug use: No     Colonoscopy: 2000  PAP: May 2015  Bone density: 12/21/2017, T-score of -0.6  Lipid panel: 02/03/2014  No Known Allergies  Current Outpatient Medications  Medication Sig Dispense Refill  . acetaminophen (TYLENOL) 500 MG tablet Take 1,000 mg by mouth every 6 (six) hours as needed  for mild pain or moderate pain.    Marland Kitchen dexamethasone (DECADRON) 4 MG tablet Take 4 mg daily x 2 days after chemo treatment. 30 tablet 1  . gabapentin (NEURONTIN) 300 MG capsule Take 1 capsule (300 mg total) by mouth at bedtime. 90 capsule 4  . hydroxypropyl methylcellulose / hypromellose (ISOPTO TEARS / GONIOVISC) 2.5 % ophthalmic solution Place 1 drop into both eyes daily as needed for dry eyes.    Marland Kitchen lidocaine-prilocaine (EMLA) cream Apply to affected area once 30 g 3  . Liniments (SALONPAS ARTHRITIS PAIN RELIEF EX) Apply 1 application topically daily as needed (knee pain).    Marland Kitchen lisinopril-hydrochlorothiazide (ZESTORETIC) 20-12.5 MG tablet Take 1/2 tablet daily by mouth 45 tablet 6  . Multiple Vitamins-Minerals (AIRBORNE PO) Take 1 tablet by mouth 2 (two) times a week.    . prochlorperazine (COMPAZINE) 10 MG tablet Take 1 tablet (10 mg total) by mouth every 6 (six) hours as needed  (Nausea or vomiting). 30 tablet 1  . ursodiol (ACTIGALL) 500 MG tablet Take 500 mg by mouth 3 (three) times daily.      No current facility-administered medications for this visit.    OBJECTIVE: Vitals:   06/20/20 0954  BP: 135/89  Pulse: 99  Resp: 18  Temp: (!) 97.5 F (36.4 C)  SpO2: 100%    Body mass index is 45.1 kg/m.     ECOG FS:1 - Symptomatic but completely ambulatory GENERAL: Patient is a well appearing female in no acute distress HEENT:  Sclerae anicteric.  Mask in place. Neck is supple.  NODES:  No cervical, supraclavicular, or axillary lymphadenopathy palpated.  BREAST EXAM:  Deferred LUNGS:  Clear to auscultation bilaterally.  No wheezes or rhonchi. HEART:  Regular rate and rhythm. No murmur appreciated. ABDOMEN:  Soft, nontender.  Positive, normoactive bowel sounds. No organomegaly palpated. MSK:  No focal spinal tenderness to palpation. Full range of motion bilaterally in the upper extremities. EXTREMITIES:  No peripheral edema.   SKIN:  Clear with no obvious rashes or skin changes. No nail dyscrasia. NEURO:  Nonfocal. Well oriented.  Appropriate affect.    LAB RESULTS:  No results found for: SPEP  Lab Results  Component Value Date   WBC 9.6 06/20/2020   NEUTROABS 7.2 06/20/2020   HGB 10.1 (L) 06/20/2020   HCT 30.6 (L) 06/20/2020   MCV 96.5 06/20/2020   PLT 274 06/20/2020      Chemistry      Component Value Date/Time   NA 140 06/20/2020 0934   NA 143 12/07/2019 1742   NA 142 11/13/2016 1050   K 4.0 06/20/2020 0934   K 4.1 11/13/2016 1050   CL 103 06/20/2020 0934   CO2 27 06/20/2020 0934   CO2 27 11/13/2016 1050   BUN 11 06/20/2020 0934   BUN 13 12/07/2019 1742   BUN 11.5 11/13/2016 1050   CREATININE 0.87 06/20/2020 0934   CREATININE 0.86 01/30/2020 0946   CREATININE 1.0 11/13/2016 1050      Component Value Date/Time   CALCIUM 10.0 06/20/2020 0934   CALCIUM 9.8 11/13/2016 1050   ALKPHOS 138 (H) 06/20/2020 0934   ALKPHOS 140 11/13/2016  1050   AST 18 06/20/2020 0934   AST 12 (L) 01/30/2020 0946   AST 13 11/13/2016 1050   ALT 57 (H) 06/20/2020 0934   ALT 14 01/30/2020 0946   ALT 15 11/13/2016 1050   BILITOT 1.0 06/20/2020 0934   BILITOT 1.3 (H) 01/30/2020 0946   BILITOT 0.86 11/13/2016 1050  No results found for: LABCA2  No components found for: [id number]  No results for input(s): INR in the last 168 hours.  Urinalysis    Component Value Date/Time   COLORURINE YELLOW 04/05/2008 1120   APPEARANCEUR CLEAR 04/05/2008 1120   LABSPEC 1.021 04/05/2008 1120   PHURINE 6.0 04/05/2008 1120   GLUCOSEU NEGATIVE 04/05/2008 1120   HGBUR NEGATIVE 04/05/2008 1120   BILIRUBINUR negative 12/07/2019 1212   KETONESUR NEGATIVE 04/05/2008 1120   PROTEINUR Negative 12/07/2019 1212   PROTEINUR NEGATIVE 04/05/2008 1120   UROBILINOGEN 1.0 12/07/2019 1212   UROBILINOGEN 0.2 04/05/2008 1120   NITRITE negative 12/07/2019 1212   NITRITE NEGATIVE 04/05/2008 1120   LEUKOCYTESUR Negative 12/07/2019 1212    STUDIES: DG Chest 2 View  Result Date: 05/30/2020 CLINICAL DATA:  69 year old female with shortness of breath. History of cancer, most recently left breast cancer diagnosed this year. EXAM: CHEST - 2 VIEW COMPARISON:  Chest CT 02/08/2020 and earlier. FINDINGS: Right shoulder arthroplasty is new from 2009 radiographs. Right chest power is new since May. Lung volumes and mediastinal contours appear stable since May. Suspect left mastectomy since May. No pneumothorax, pulmonary edema, pleural effusion or confluent pulmonary opacity. Visualized tracheal air column is within normal limits. No acute osseous abnormality identified. Negative visible bowel gas pattern. IMPRESSION: No acute cardiopulmonary abnormality.  Left mastectomy since May. Electronically Signed   By: Genevie Ann M.D.   On: 05/30/2020 17:50      ASSESSMENT: 69 y.o. BRCA negative [place] woman s/p left breast upper outer quadrant and left axillary lymph node biopsy  02/22/2014, both positive for a clinical T2 N2, stage IIIA invasive ductal carcinoma, grade 3, triple negative, with an MIB-1 of 90%  (1) neoadjuvant chemotherapy started 03/13/2014, with cyclophosphamide and doxorubicin in dose dense fashion x4, with Neulasta support on day 2, completed 04/24/2014, followed by weekly carboplatin and paclitaxel x12, paclitaxel reduced during cycle 5 and cycle 7 because of elevated bilirubin levels  (2) left lumpectomy and sentinel lymph node sampling 09/20/2014 showed a complete pathologic response.  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  (b) PALB2 mutations are known to increase the risk of breast and pancreatic cancer, possibly other cancers, but the data is preliminary  (c) the patient's 2 daughters have been tested and did not carry the gene  (d) Kanijah's MSH2 VUS has been reclassified as "likely benign"  (e) she is status post remote TAH/BSO  (f) intensified screening, with mammography in February and breast MRI in August planned  (6) staging studies showed right-sided lung nodules, too small to characterize, and a dominant right-sided thyroid nodule unchanged in size as compared to 2011; to be followed  (a) chest CT 04/24/2016 showed the small pulmonary nodules to be completely stable, consistent with benign findings.  (b) thyroid ultrasound--done on 07/22/2018 showed enlarged heterogeneous lobular and multinodular thyroid gland, TI-RADS category 3 nodules, not meeting criteria for further biopsy or dedicated imaging f/u.    (7) intraductal papilloma biopsied right breast 11/06/2017  (a) status post right lumpectomy 12/21/2017 showing only ductal  papilloma, no evidence of malignancy.  (8) anastrozole started 07/08/2018 for breast cancer prevention, discontinued 01/30/2020 with disease recurrence  (a) bone density scan 01/01/2018 normal, T score -  0.6  (9) RECURRENT DISEASE May 2021  (a) left breast overlapping biopsy 01/20/2020 shows a clinical  T2 N0, stage IIB invasive ductal carcinoma, grade 2 or 3, with moderate estrogen receptor positivity, progesterone receptor negative, HER-2 not amplified, MIB-1 of 80%  (b) Left Mastectomy on 02/14/2020: IDC, grade 3, 2.3 cm, margins negative, T2, NX.   (c) CT of the chest and bone scan on 02/08/2020 shows no distant metastases  (10) started fulvestrant and palbociclib 02/24/2020 discontinued on 03/08/2020  (a) Oncotype sent on surgical sample shows score of 60 indicating a greater than 29% risk of recurrence on antiestrogens alone, and also predicting a significant benefit from chemotherapy.  (b) the Oncotype test found this tumor to be triple negative by single gene scores.   (11) CMF started on 03/27/2020, to be repeated every 21 days x 8   PLAN: Debby continues on treatment with CMF every 3 weeks and tolerates this quite well.  Her labs today so far are stable.  She has no signs of recurrence of her breast cancer.  She will continue on this treatment for 8 cycles.    Lisanne and I discussed her dyspnea, which is likely secondary to deconditioning.  I am happy that as she has increased her exercise, this shortness of breath has improved.    Syrai will return in 3 weeks for labs, f/u, and her next chemotherapy.  She knows to call for any questions that may arise between now and her next appointment.  We are happy to see her sooner if needed.   Total encounter time 20 minutes.Wilber Bihari, NP 06/20/20 10:21 AM Medical Oncology and Hematology [facility] [place], [place] [address] Tel. [phone]    Fax. [phone]  *Total Encounter Time as defined by the  Centers for Medicare and Medicaid Services includes, in addition to the face-to-face time of a patient visit (documented in the note above) non-face-to-face time: obtaining and reviewing outside history, ordering and reviewing medications, tests or procedures, care coordination (communications with other health care professionals or caregivers) and documentation in the medical record.

## 2020-06-20 NOTE — Patient Instructions (Signed)
[facility] Discharge Instructions for Patients Receiving Chemotherapy  Today you received the following chemotherapy agents: Cytoxan, methotrexate, 5FU  To help prevent nausea and vomiting after your treatment, we encourage you to take your nausea medication as directed.    If you develop nausea and vomiting that is not controlled by your nausea medication, call the clinic.   BELOW ARE SYMPTOMS THAT SHOULD BE REPORTED IMMEDIATELY:  *FEVER GREATER THAN 100.5 F  *CHILLS WITH OR WITHOUT FEVER  NAUSEA AND VOMITING THAT IS NOT CONTROLLED WITH YOUR NAUSEA MEDICATION  *UNUSUAL SHORTNESS OF BREATH  *UNUSUAL BRUISING OR BLEEDING  TENDERNESS IN MOUTH AND THROAT WITH OR WITHOUT PRESENCE OF ULCERS  *URINARY PROBLEMS  *BOWEL PROBLEMS  UNUSUAL RASH Items with * indicate a potential emergency and should be followed up as soon as possible.  Feel free to call the clinic should you have any questions or concerns. The clinic phone number is [phone].  Please show the CHEMO ALERT CARD at check-in to the Emergency Department and triage nurse.   

## 2020-06-20 NOTE — Patient Instructions (Signed)

## 2020-06-21 DIAGNOSIS — H401111 Primary open-angle glaucoma, right eye, mild stage: Secondary | ICD-10-CM | POA: Diagnosis not present

## 2020-06-21 DIAGNOSIS — H402223 Chronic angle-closure glaucoma, left eye, severe stage: Secondary | ICD-10-CM | POA: Diagnosis not present

## 2020-06-21 DIAGNOSIS — H524 Presbyopia: Secondary | ICD-10-CM | POA: Diagnosis not present

## 2020-06-21 DIAGNOSIS — H52223 Regular astigmatism, bilateral: Secondary | ICD-10-CM | POA: Diagnosis not present

## 2020-06-21 DIAGNOSIS — H5203 Hypermetropia, bilateral: Secondary | ICD-10-CM | POA: Diagnosis not present

## 2020-06-21 DIAGNOSIS — H25813 Combined forms of age-related cataract, bilateral: Secondary | ICD-10-CM | POA: Diagnosis not present

## 2020-06-22 ENCOUNTER — Telehealth: Payer: Self-pay | Admitting: Adult Health

## 2020-06-22 NOTE — Telephone Encounter (Signed)
Scheduled per 10/6 los. Called and spoke with pt, confirmed 10/27 appt

## 2020-06-26 DIAGNOSIS — Z86010 Personal history of colonic polyps: Secondary | ICD-10-CM | POA: Diagnosis not present

## 2020-06-26 DIAGNOSIS — K743 Primary biliary cirrhosis: Secondary | ICD-10-CM | POA: Diagnosis not present

## 2020-06-26 DIAGNOSIS — R635 Abnormal weight gain: Secondary | ICD-10-CM | POA: Diagnosis not present

## 2020-06-26 DIAGNOSIS — R748 Abnormal levels of other serum enzymes: Secondary | ICD-10-CM | POA: Diagnosis not present

## 2020-06-26 DIAGNOSIS — K760 Fatty (change of) liver, not elsewhere classified: Secondary | ICD-10-CM | POA: Diagnosis not present

## 2020-07-11 ENCOUNTER — Inpatient Hospital Stay: Payer: PPO

## 2020-07-11 ENCOUNTER — Other Ambulatory Visit: Payer: Self-pay

## 2020-07-11 ENCOUNTER — Inpatient Hospital Stay (HOSPITAL_BASED_OUTPATIENT_CLINIC_OR_DEPARTMENT_OTHER): Payer: PPO | Admitting: Medical

## 2020-07-11 VITALS — HR 86

## 2020-07-11 VITALS — BP 107/71 | HR 114 | Temp 97.9°F | Resp 20 | Ht 61.6 in

## 2020-07-11 DIAGNOSIS — R0609 Other forms of dyspnea: Secondary | ICD-10-CM

## 2020-07-11 DIAGNOSIS — C50412 Malignant neoplasm of upper-outer quadrant of left female breast: Secondary | ICD-10-CM

## 2020-07-11 DIAGNOSIS — C50919 Malignant neoplasm of unspecified site of unspecified female breast: Secondary | ICD-10-CM

## 2020-07-11 DIAGNOSIS — R0600 Dyspnea, unspecified: Secondary | ICD-10-CM | POA: Diagnosis not present

## 2020-07-11 DIAGNOSIS — Z5111 Encounter for antineoplastic chemotherapy: Secondary | ICD-10-CM | POA: Diagnosis not present

## 2020-07-11 DIAGNOSIS — Z171 Estrogen receptor negative status [ER-]: Secondary | ICD-10-CM

## 2020-07-11 DIAGNOSIS — Z1502 Genetic susceptibility to malignant neoplasm of ovary: Secondary | ICD-10-CM

## 2020-07-11 DIAGNOSIS — Z95828 Presence of other vascular implants and grafts: Secondary | ICD-10-CM

## 2020-07-11 LAB — COMPREHENSIVE METABOLIC PANEL
ALT: 53 U/L — ABNORMAL HIGH (ref 0–44)
AST: 22 U/L (ref 15–41)
Albumin: 3.4 g/dL — ABNORMAL LOW (ref 3.5–5.0)
Alkaline Phosphatase: 166 U/L — ABNORMAL HIGH (ref 38–126)
Anion gap: 12 (ref 5–15)
BUN: 24 mg/dL — ABNORMAL HIGH (ref 8–23)
CO2: 22 mmol/L (ref 22–32)
Calcium: 10.1 mg/dL (ref 8.9–10.3)
Chloride: 102 mmol/L (ref 98–111)
Creatinine, Ser: 1.05 mg/dL — ABNORMAL HIGH (ref 0.44–1.00)
GFR, Estimated: 58 mL/min — ABNORMAL LOW (ref >60)
Glucose, Bld: 97 mg/dL (ref 70–99)
Potassium: 4.4 mmol/L (ref 3.5–5.1)
Sodium: 136 mmol/L (ref 135–145)
Total Bilirubin: 1.4 mg/dL — ABNORMAL HIGH (ref 0.3–1.2)
Total Protein: 7.3 g/dL (ref 6.5–8.1)

## 2020-07-11 LAB — CBC WITH DIFFERENTIAL/PLATELET
Abs Immature Granulocytes: 0.45 10*3/uL — ABNORMAL HIGH (ref 0.00–0.07)
Basophils Absolute: 0.1 10*3/uL (ref 0.0–0.1)
Basophils Relative: 1 %
Eosinophils Absolute: 0 10*3/uL (ref 0.0–0.5)
Eosinophils Relative: 0 %
HCT: 33 % — ABNORMAL LOW (ref 36.0–46.0)
Hemoglobin: 10.9 g/dL — ABNORMAL LOW (ref 12.0–15.0)
Immature Granulocytes: 4 %
Lymphocytes Relative: 14 %
Lymphs Abs: 1.4 10*3/uL (ref 0.7–4.0)
MCH: 31.8 pg (ref 26.0–34.0)
MCHC: 33 g/dL (ref 30.0–36.0)
MCV: 96.2 fL (ref 80.0–100.0)
Monocytes Absolute: 0.8 10*3/uL (ref 0.1–1.0)
Monocytes Relative: 7 %
Neutro Abs: 7.7 10*3/uL (ref 1.7–7.7)
Neutrophils Relative %: 74 %
Platelets: 285 10*3/uL (ref 150–400)
RBC: 3.43 MIL/uL — ABNORMAL LOW (ref 3.87–5.11)
RDW: 14.6 % (ref 11.5–15.5)
WBC: 10.4 10*3/uL (ref 4.0–10.5)
nRBC: 1.1 % — ABNORMAL HIGH (ref 0.0–0.2)

## 2020-07-11 MED ORDER — PALONOSETRON HCL INJECTION 0.25 MG/5ML
INTRAVENOUS | Status: AC
  Filled 2020-07-11: qty 5

## 2020-07-11 MED ORDER — FLUOROURACIL CHEMO INJECTION 2.5 GM/50ML
600.0000 mg/m2 | Freq: Once | INTRAVENOUS | Status: AC
  Administered 2020-07-11: 1300 mg via INTRAVENOUS
  Filled 2020-07-11: qty 26

## 2020-07-11 MED ORDER — METHOTREXATE SODIUM (PF) CHEMO INJECTION 250 MG/10ML
40.0000 mg/m2 | Freq: Once | INTRAMUSCULAR | Status: AC
  Administered 2020-07-11: 86 mg via INTRAVENOUS
  Filled 2020-07-11: qty 3.44

## 2020-07-11 MED ORDER — SODIUM CHLORIDE 0.9 % IV SOLN
Freq: Once | INTRAVENOUS | Status: AC
  Filled 2020-07-11: qty 250

## 2020-07-11 MED ORDER — SODIUM CHLORIDE 0.9 % IV SOLN
10.0000 mg | Freq: Once | INTRAVENOUS | Status: AC
  Administered 2020-07-11: 10 mg via INTRAVENOUS
  Filled 2020-07-11: qty 10

## 2020-07-11 MED ORDER — SODIUM CHLORIDE 0.9% FLUSH
10.0000 mL | INTRAVENOUS | Status: DC | PRN
  Administered 2020-07-11: 10 mL
  Filled 2020-07-11: qty 10

## 2020-07-11 MED ORDER — SODIUM CHLORIDE 0.9% FLUSH
10.0000 mL | Freq: Once | INTRAVENOUS | Status: AC
  Administered 2020-07-11: 10 mL
  Filled 2020-07-11: qty 10

## 2020-07-11 MED ORDER — PALONOSETRON HCL INJECTION 0.25 MG/5ML
0.2500 mg | Freq: Once | INTRAVENOUS | Status: AC
  Administered 2020-07-11: 0.25 mg via INTRAVENOUS

## 2020-07-11 MED ORDER — HEPARIN SOD (PORK) LOCK FLUSH 100 UNIT/ML IV SOLN
500.0000 [IU] | Freq: Once | INTRAVENOUS | Status: AC | PRN
  Administered 2020-07-11: 500 [IU]
  Filled 2020-07-11: qty 5

## 2020-07-11 MED ORDER — SODIUM CHLORIDE 0.9 % IV SOLN
600.0000 mg/m2 | Freq: Once | INTRAVENOUS | Status: AC
  Administered 2020-07-11: 1300 mg via INTRAVENOUS
  Filled 2020-07-11: qty 65

## 2020-07-11 NOTE — Patient Instructions (Signed)

## 2020-07-11 NOTE — Progress Notes (Signed)
OK to treat.  Amori Colomb, MHS, PA-C Physician Assistant 

## 2020-07-11 NOTE — Patient Instructions (Signed)
[facility] Discharge Instructions for Patients Receiving Chemotherapy  Today you received the following chemotherapy agents: cytoxan, methotrexate, fluorouracil  To help prevent nausea and vomiting after your treatment, we encourage you to take your nausea medication as directed.    If you develop nausea and vomiting that is not controlled by your nausea medication, call the clinic.   BELOW ARE SYMPTOMS THAT SHOULD BE REPORTED IMMEDIATELY:  *FEVER GREATER THAN 100.5 F  *CHILLS WITH OR WITHOUT FEVER  NAUSEA AND VOMITING THAT IS NOT CONTROLLED WITH YOUR NAUSEA MEDICATION  *UNUSUAL SHORTNESS OF BREATH  *UNUSUAL BRUISING OR BLEEDING  TENDERNESS IN MOUTH AND THROAT WITH OR WITHOUT PRESENCE OF ULCERS  *URINARY PROBLEMS  *BOWEL PROBLEMS  UNUSUAL RASH Items with * indicate a potential emergency and should be followed up as soon as possible.  Feel free to call the clinic should you have any questions or concerns. The clinic phone number is [phone].  Please show the CHEMO ALERT CARD at check-in to the Emergency Department and triage nurse.   

## 2020-07-11 NOTE — Patient Instructions (Signed)

## 2020-07-11 NOTE — Progress Notes (Signed)
[facility] Progress Note   [name] [id number] [birth year] 69 y.o.  [name] is managed by Dr. Lurline Del  Actively treated with chemotherapy/immunotherapy/hormonal therapy: yes  Current therapy: CMF  Last treated: 06/20/2020 (cycle #5, day #1)  Next scheduled appointment with provider: 08/01/2020  Assessment: Plan:    Malignant neoplasm of upper-outer quadrant of left breast in female, estrogen receptor negative ([place])  Dyspnea on exertion   ER negative malignant neoplasm of the left breast: Ms. [name] presents to the clinic today for consideration of cycle 6, day 1 of CMF chemotherapy.  She continues to be followed by Dr. Jana Hakim and is scheduled to be seen next on 08/01/2020.  Dyspnea on exertion: The patient's oxygen saturation was checked at rest and with ambulation.  Her oxygen saturation at rest was 98% and range from 93 to 100% with ambulation.  It was noted that her heart rate increased significantly to 140 bpm.  I discussed with her that this could be deconditioning and suggested to her that simply losing 5 pounds could make a difference in the way that she is feeling.  She is agreeable to make an attempt at this goal.  Please see After Visit Summary for patient specific instructions.  Future Appointments  Date Time Provider [place]  07/11/2020 11:15 AM [facility] INFUSION [facility] None  08/01/2020 10:15 AM [facility] LAB [facility] None  08/01/2020 10:30 AM [facility] [place] FLUSH [facility] None  08/01/2020 11:00 AM Heilingoetter, Cassandra L, PA-C [facility] None  08/01/2020 12:00 PM [facility] INFUSION [facility] None  08/22/2020  9:15 AM [facility] LAB [facility] None  08/22/2020  9:30 AM [facility] [place] FLUSH [facility] None  08/22/2020 10:00 AM Causey, Charlestine Massed, NP [facility] None  08/22/2020 11:00 AM [facility] INFUSION [facility] None  12/12/2020  9:00 AM [facility] None  12/12/2020   9:30 AM Minette Brine, FNP [facility] None    No orders of the defined types were placed in this encounter.      Subjective:   Patient ID:  [name] is a 69 y.o. (DOB [birth year]) female.  Chief Complaint:  Chief Complaint  Patient presents with  . Follow-up    HPI [name]  is a 69 y.o. female with a diagnosis of an ER negative malignant neoplasm of the left breast.  She continues to be managed by Dr. Jana Hakim and presents to the clinic today for consideration of cycle 6, day 1 of CMF chemotherapy.  She reports that she has shortness of breath which is at her baseline.  She notes this most when she is walking.  She reports that she has had a chest x-ray and EKG which did not show any pathology.  She is otherwise doing well and has been tolerating her chemotherapy without any significant side effects.  She denies fevers, chills, nausea, vomiting, or diarrhea.  She reports that she and her sister plan to travel to [facility] in April to celebrate her completing her treatments.   Medications: I have reviewed the patient's current medications.  Allergies: No Known Allergies  Past Medical History:  Diagnosis Date  . Colon cancer ([place])   . Hx of rotator cuff surgery   . Hypertension   . left breast cancer   . Personal history of chemotherapy 2016   left  . Personal history of radiation therapy 2016   left breast   . Radiation 11/15/14-01/02/15   left breast, axillary and supraclavicular region 45 gray, lumpectomy cavity boosted to 16 gray  Past Surgical History:  Procedure Laterality Date  . ABDOMINAL HYSTERECTOMY  1988  . BREAST EXCISIONAL BIOPSY Right 12/21/2017  . BREAST LUMPECTOMY Left 2016  . COLON SURGERY  1988   colectomy/colostomy-ca  . COLON SURGERY  1988   colostomy takedown-reversal  . COLONOSCOPY    . EXCISION / BIOPSY BREAST / NIPPLE / DUCT Left 09/20/2014  . IR IMAGING GUIDED PORT INSERTION  03/22/2020  . MASTECTOMY Left 02/14/2020  .  MASTECTOMY W/ SENTINEL NODE BIOPSY Left 02/14/2020   Procedure: LEFT MASTECTOMY WITH BLUE DYE INJECTION;  Surgeon: Rolm Bookbinder, MD;  Location: [place];  Service: General;  Laterality: Left;  PEC BLOCK  . RADIOACTIVE SEED GUIDED EXCISIONAL BREAST BIOPSY Right 12/21/2017   Procedure: RIGHT RADIOACTIVE SEED GUIDED EXCISIONAL BREAST BIOPSY ERAS PATHWAY;  Surgeon: Rolm Bookbinder, MD;  Location: [place];  Service: General;  Laterality: Right;  . RADIOACTIVE SEED GUIDED PARTIAL MASTECTOMY WITH AXILLARY SENTINEL LYMPH NODE BIOPSY Left 09/20/2014   Procedure: RADIOACTIVE SEED GUIDED LEFT BREAST LUMPECTOMY WITH AXILLARY SENTINEL LYMPH NODE BIOPSY;  Surgeon: Rolm Bookbinder, MD;  Location: [facility];  Service: General;  Laterality: Left;  . TOTAL SHOULDER ARTHROPLASTY  2009   right    Family History  Problem Relation Age of Onset  . Cancer Mother 48       breast  . Breast cancer Mother        early 70s  . Stroke Father 31       cause of death  . Diabetes Father   . Cancer Maternal Aunt        breast cancer at unknown age  . Breast cancer Maternal Aunt   . Ovarian cancer Neg Hx     Social History   Socioeconomic History  . Marital status: Divorced    Spouse name: Not on file  . Number of children: 2  . Years of education: Not on file  . Highest education level: Not on file  Occupational History    Employer: [employer]    Comment: retired in 2008  Tobacco Use  . Smoking status: Never Smoker  . Smokeless tobacco: Never Used  Vaping Use  . Vaping Use: Never used  Substance and Sexual Activity  . Alcohol use: Not Currently    Comment: occ-rare  . Drug use: No  . Sexual activity: Not Currently  Other Topics Concern  . Not on file  Social History Narrative  . Not on file   Social Determinants of Health   Financial Resource Strain: Low Risk   . Difficulty of Paying Living Expenses: Not hard at all  Food Insecurity: No Food Insecurity  .  Worried About Charity fundraiser in the Last Year: Never true  . Ran Out of Food in the Last Year: Never true  Transportation Needs: No Transportation Needs  . Lack of Transportation (Medical): No  . Lack of Transportation (Non-Medical): No  Physical Activity: Insufficiently Active  . Days of Exercise per Week: 2 days  . Minutes of Exercise per Session: 30 min  Stress: No Stress Concern Present  . Feeling of Stress : Not at all  Social Connections:   . Frequency of Communication with Friends and Family: Not on file  . Frequency of Social Gatherings with Friends and Family: Not on file  . Attends Religious Services: Not on file  . Active Member of Clubs or Organizations: Not on file  . Attends Archivist Meetings: Not on file  .  Marital Status: Not on file  Intimate Partner Violence:   . Fear of Current or Ex-Partner: Not on file  . Emotionally Abused: Not on file  . Physically Abused: Not on file  . Sexually Abused: Not on file    Past Medical History, Surgical history, Social history, and Family history were reviewed and updated as appropriate.   Please see review of systems for further details on the patient's review from today.   Review of Systems:  Review of Systems  Constitutional: Negative for chills, diaphoresis and fever.  HENT: Negative for trouble swallowing and voice change.   Respiratory: Positive for shortness of breath. Negative for cough, choking, chest tightness, wheezing and stridor.   Cardiovascular: Negative for chest pain and palpitations.  Gastrointestinal: Negative for abdominal pain, constipation, diarrhea, nausea and vomiting.  Musculoskeletal: Negative for back pain and myalgias.  Neurological: Negative for dizziness, light-headedness and headaches.    Objective:   Physical Exam:  BP 107/71 (BP Location: Right Arm, Patient Position: Sitting)   Pulse (!) 114   Temp 97.9 F (36.6 C) (Tympanic)   Resp 20   Ht 5' 1.6" (1.565 m)   SpO2  100%   BMI 45.10 kg/m  ECOG: 0  Oxygen Saturation:  Room air at rest:    98 % Room air with ambulation:   93 to 100 %  Physical Exam Constitutional:      General: She is not in acute distress.    Appearance: She is not diaphoretic.  HENT:     Head: Normocephalic and atraumatic.  Eyes:     General: No scleral icterus.       Right eye: No discharge.        Left eye: No discharge.     Conjunctiva/sclera: Conjunctivae normal.  Cardiovascular:     Rate and Rhythm: Normal rate and regular rhythm.     Heart sounds: Normal heart sounds. No murmur heard.  No friction rub. No gallop.   Pulmonary:     Effort: Pulmonary effort is normal. No respiratory distress.     Breath sounds: Normal breath sounds. No wheezing or rales.  Musculoskeletal:     Right lower leg: Edema present.     Left lower leg: Edema present.     Comments: Trace bilateral pitting edema of the lower extremities is noted.  Skin:    General: Skin is warm and dry.     Findings: No erythema or rash.  Neurological:     Mental Status: She is alert.     Coordination: Coordination normal.     Gait: Gait abnormal (The patient is ambulating with the use of a cane.).  Psychiatric:        Mood and Affect: Mood normal.        Behavior: Behavior normal.        Thought Content: Thought content normal.        Judgment: Judgment normal.     Lab Review:     Component Value Date/Time   NA 136 07/11/2020 0945   NA 143 12/07/2019 1742   NA 142 11/13/2016 1050   K 4.4 07/11/2020 0945   K 4.1 11/13/2016 1050   CL 102 07/11/2020 0945   CO2 22 07/11/2020 0945   CO2 27 11/13/2016 1050   GLUCOSE 97 07/11/2020 0945   GLUCOSE 122 11/13/2016 1050   BUN 24 (H) 07/11/2020 0945   BUN 13 12/07/2019 1742   BUN 11.5 11/13/2016 1050   CREATININE 1.05 (H) 07/11/2020 0945  CREATININE 0.86 01/30/2020 0946   CREATININE 1.0 11/13/2016 1050   CALCIUM 10.1 07/11/2020 0945   CALCIUM 9.8 11/13/2016 1050   PROT 7.3 07/11/2020 0945   PROT  7.2 12/07/2019 1742   PROT 7.4 11/13/2016 1050   ALBUMIN 3.4 (L) 07/11/2020 0945   ALBUMIN 4.1 12/07/2019 1742   ALBUMIN 3.5 11/13/2016 1050   AST 22 07/11/2020 0945   AST 12 (L) 01/30/2020 0946   AST 13 11/13/2016 1050   ALT 53 (H) 07/11/2020 0945   ALT 14 01/30/2020 0946   ALT 15 11/13/2016 1050   ALKPHOS 166 (H) 07/11/2020 0945   ALKPHOS 140 11/13/2016 1050   BILITOT 1.4 (H) 07/11/2020 0945   BILITOT 1.3 (H) 01/30/2020 0946   BILITOT 0.86 11/13/2016 1050   GFRNONAA 58 (L) 07/11/2020 0945   GFRNONAA >60 01/30/2020 0946   GFRAA 52 (L) 05/30/2020 1248   GFRAA >60 01/30/2020 0946       Component Value Date/Time   WBC 10.4 07/11/2020 0945   RBC 3.43 (L) 07/11/2020 0945   HGB 10.9 (L) 07/11/2020 0945   HGB 13.1 01/30/2020 0946   HGB 13.1 12/07/2019 1742   HGB 12.6 11/13/2016 1050   HCT 33.0 (L) 07/11/2020 0945   HCT 39.3 12/07/2019 1742   HCT 37.5 11/13/2016 1050   PLT 285 07/11/2020 0945   PLT 281 01/30/2020 0946   PLT 321 12/07/2019 1742   MCV 96.2 07/11/2020 0945   MCV 89 12/07/2019 1742   MCV 90.2 11/13/2016 1050   MCH 31.8 07/11/2020 0945   MCHC 33.0 07/11/2020 0945   RDW 14.6 07/11/2020 0945   RDW 12.7 12/07/2019 1742   RDW 14.0 11/13/2016 1050   LYMPHSABS 1.4 07/11/2020 0945   LYMPHSABS 1.6 11/13/2016 1050   MONOABS 0.8 07/11/2020 0945   MONOABS 0.4 11/13/2016 1050   EOSABS 0.0 07/11/2020 0945   EOSABS 0.1 11/13/2016 1050   BASOSABS 0.1 07/11/2020 0945   BASOSABS 0.0 11/13/2016 1050   -------------------------------  Imaging from last 24 hours (if applicable):  Radiology interpretation: No results found.

## 2020-07-12 ENCOUNTER — Other Ambulatory Visit: Payer: Self-pay | Admitting: Oncology

## 2020-07-12 DIAGNOSIS — C50412 Malignant neoplasm of upper-outer quadrant of left female breast: Secondary | ICD-10-CM

## 2020-07-12 DIAGNOSIS — C50919 Malignant neoplasm of unspecified site of unspecified female breast: Secondary | ICD-10-CM

## 2020-07-12 DIAGNOSIS — Z1502 Genetic susceptibility to malignant neoplasm of ovary: Secondary | ICD-10-CM

## 2020-07-12 NOTE — Progress Notes (Signed)
[name]'s T bil is slightly up. She has known hepatic steatosis. CMF generally does not affect the liver. We will review labs before treatment 11/17 at her next visit prior to her next dose of CMF.

## 2020-07-26 DIAGNOSIS — H52223 Regular astigmatism, bilateral: Secondary | ICD-10-CM | POA: Diagnosis not present

## 2020-07-26 DIAGNOSIS — H25813 Combined forms of age-related cataract, bilateral: Secondary | ICD-10-CM | POA: Diagnosis not present

## 2020-07-26 DIAGNOSIS — H402223 Chronic angle-closure glaucoma, left eye, severe stage: Secondary | ICD-10-CM | POA: Diagnosis not present

## 2020-07-26 DIAGNOSIS — H401111 Primary open-angle glaucoma, right eye, mild stage: Secondary | ICD-10-CM | POA: Diagnosis not present

## 2020-07-26 DIAGNOSIS — H5203 Hypermetropia, bilateral: Secondary | ICD-10-CM | POA: Diagnosis not present

## 2020-07-30 NOTE — Progress Notes (Signed)
[facility] OFFICE PROGRESS NOTE  Minette Brine, [address] [place] [address]  DIAGNOSIS: Triple negative breast cancer; PALB2 positive (s/p left mastectomy)  CURRENT THERAPY: She started on adjuvant CMF chemotherapy given on day 1 of a 21 day cycle.  She is here for her 7th cycle of treatment.  She tolerates this well with no issues.    INTERVAL HISTORY: [name] 69 y.o. female returns to the clinic today for a follow up visit.  The patient is feeling well today without any concerning complaints. The patient continues to tolerate treatment with chemotherapy well without any adverse effects. She states that  Denies any fever, chills, night sweats, or weight loss. She states she does not have a good appetite but that she still eats well despite this. She gained a few pounds since her last appointment. Denies any chest pain, cough, or hemoptysis. She reports her baseline dyspnea on exertion. She ambulates with a cane. Denies any nausea, vomiting, diarrhea, or constipation. Denies any headache or visual changes. Denies any rashes or skin changes. She denies mucositis. The patient recently had an appointment with Dr. Collene Mares, her gastroenterologist, who she follows for hepatic steatosis. The patient is here today for evaluation prior to starting cycle # 7.    BREAST CANCER HISTORY: From the original intake note:  [name] herself palpated a mass in her left breast, and immediately brought it to the attention of her primary care physician, Dr. Baird Cancer, who confirmed the finding and setup the patient for bilateral diagnostic mammography at the breast Center 02/20/2014. This showed a high density mass in the outer left breast measuring up to 3.7 cm. It corresponded to the site of palpable concern. In addition the normal 4 logically abnormal lymph nodes in the left axilla. The mass was palpable by exam, and by ultrasonography measured 2.6 cm. There were enlarged  and morphologically abnormal lymph nodes in the left axilla, largest measuring 3 cm.  Biopsy of the left breast mass in question and 1 of the abnormal axillary lymph nodes 02/22/2014 showed ([id number]) biopsies to be positive for an invasive ductal carcinoma, grade 3, triple negative, with an MIB-1 of 90%. Specifically the HER-2 signals ratio was 1.05, and the number per cell 2.00.  MRI of the breasts 03/01/2014 showed a 3.8 cm enhancing mass with areas of apparent necrosis in the left breast, as well as the biopsy tract extending laterally, the combined measure 5.3 cm. There were no other masses or areas of abnormal enhancement. There were multiple abnormally enlarged left axillary lymph nodes with loss of the normal fatty hilum. These included level I and retropectoral lymph nodes.  The patient's subsequent history is as detailed below  MEDICAL HISTORY: Past Medical History:  Diagnosis Date  . Colon cancer ([place])   . Hx of rotator cuff surgery   . Hypertension   . left breast cancer   . Personal history of chemotherapy 2016   left  . Personal history of radiation therapy 2016   left breast   . Radiation 11/15/14-01/02/15   left breast, axillary and supraclavicular region 45 gray, lumpectomy cavity boosted to 16 gray    ALLERGIES:  has No Known Allergies.  MEDICATIONS:  Current Outpatient Medications  Medication Sig Dispense Refill  . acetaminophen (TYLENOL) 500 MG tablet Take 1,000 mg by mouth every 6 (six) hours as needed for mild pain or moderate pain.    Marland Kitchen dexamethasone (DECADRON) 4 MG tablet Take 4 mg  daily x 2 days after chemo treatment. 30 tablet 1  . gabapentin (NEURONTIN) 300 MG capsule Take 1 capsule (300 mg total) by mouth at bedtime. 90 capsule 4  . hydroxypropyl methylcellulose / hypromellose (ISOPTO TEARS / GONIOVISC) 2.5 % ophthalmic solution Place 1 drop into both eyes daily as needed for dry eyes.    Marland Kitchen lidocaine-prilocaine (EMLA) cream Apply to affected area once 30  g 3  . Liniments (SALONPAS ARTHRITIS PAIN RELIEF EX) Apply 1 application topically daily as needed (knee pain).    Marland Kitchen lisinopril-hydrochlorothiazide (ZESTORETIC) 20-12.5 MG tablet Take 1/2 tablet daily by mouth 45 tablet 6  . LUMIGAN 0.01 % SOLN     . Multiple Vitamins-Minerals (AIRBORNE PO) Take 1 tablet by mouth 2 (two) times a week.    . prochlorperazine (COMPAZINE) 10 MG tablet Take 1 tablet (10 mg total) by mouth every 6 (six) hours as needed (Nausea or vomiting). 30 tablet 1  . ursodiol (ACTIGALL) 500 MG tablet Take 500 mg by mouth 3 (three) times daily.      No current facility-administered medications for this visit.    SURGICAL HISTORY:  Past Surgical History:  Procedure Laterality Date  . ABDOMINAL HYSTERECTOMY  1988  . BREAST EXCISIONAL BIOPSY Right 12/21/2017  . BREAST LUMPECTOMY Left 2016  . COLON SURGERY  1988   colectomy/colostomy-ca  . COLON SURGERY  1988   colostomy takedown-reversal  . COLONOSCOPY    . EXCISION / BIOPSY BREAST / NIPPLE / DUCT Left 09/20/2014  . IR IMAGING GUIDED PORT INSERTION  03/22/2020  . MASTECTOMY Left 02/14/2020  . MASTECTOMY W/ SENTINEL NODE BIOPSY Left 02/14/2020   Procedure: LEFT MASTECTOMY WITH BLUE DYE INJECTION;  Surgeon: Rolm Bookbinder, MD;  Location: [facility];  Service: General;  Laterality: Left;  PEC BLOCK  . RADIOACTIVE SEED GUIDED EXCISIONAL BREAST BIOPSY Right 12/21/2017   Procedure: RIGHT RADIOACTIVE SEED GUIDED EXCISIONAL BREAST BIOPSY ERAS PATHWAY;  Surgeon: Rolm Bookbinder, MD;  Location: [facility];  Service: General;  Laterality: Right;  . RADIOACTIVE SEED GUIDED PARTIAL MASTECTOMY WITH AXILLARY SENTINEL LYMPH NODE BIOPSY Left 09/20/2014   Procedure: RADIOACTIVE SEED GUIDED LEFT BREAST LUMPECTOMY WITH AXILLARY SENTINEL LYMPH NODE BIOPSY;  Surgeon: Rolm Bookbinder, MD;  Location: [facility];  Service: General;  Laterality: Left;  . TOTAL SHOULDER ARTHROPLASTY  2009   right    the patient's father  died at the age of 67 following a stroke in the setting of diabetes; the patient's mother is living at 69. The patient has 3 brothers and 2 sisters. [name], [name], was diagnosed with breast cancer in her early 54s. There is no other breast or ovarian cancer in the family. The patient herself was diagnosed with watts by history he is an incidentally found stage I colon carcinoma noted at the time of her hysterectomy. She had a partial colectomy but no adjuvant treatment. We have no records regarding that procedure   GYNECOLOGIC HISTORY:  No LMP recorded. Patient has had a hysterectomy. Menarche age 22, first live birth age 2, the patient underwent total abdominal hysterectomy with bilateral salpingo-oophorectomy in her 24s. She did not take hormone replacement. She did not take oral contraceptives at any point.   SOCIAL HISTORY: (Updated May 2021 Afnan worked for [employer] for about 40 years, retiring in 2008. She is divorced and lives alone, with no pets.  Her grandson who is a Conservator, museum/gallery frequently stays with her.  Her daughter [name] works for  [employer] in accounting. The second daughter, [name], works as a Scientist, water quality at [employer].  The patient has 6 grandchildren and three great-grandchildren. She attends a [employer]. (updated 09/28/2018)                          ADVANCED DIRECTIVES: Not in place. The patient intends to name her daughter [name]. [name] is work number is [phone]. On the patient's 03/01/2014 visit she was given a copy of the appropriate documents to complete and notarize at her discretion.     REVIEW OF SYSTEMS:   Review of Systems  Constitutional: Negative for appetite change, chills, fatigue, fever and unexpected weight change.  HENT: Negative for mouth sores, nosebleeds, sore throat and trouble swallowing.   Eyes: Negative for eye problems and icterus.  Respiratory: Positive  for baseline dyspnea on exertion. Negative for cough, hemoptysis, and wheezing.   Cardiovascular: Negative for chest pain and leg swelling.  Gastrointestinal: Negative for abdominal pain, constipation, diarrhea, nausea and vomiting.  Genitourinary: Negative for bladder incontinence, difficulty urinating, dysuria, frequency and hematuria.   Musculoskeletal: Negative for back pain, gait problem, neck pain and neck stiffness.  Skin: Negative for itching and rash.  Neurological: Negative for dizziness, extremity weakness, gait problem, headaches, light-headedness and seizures.  Hematological: Negative for adenopathy. Does not bruise/bleed easily.  Psychiatric/Behavioral: Negative for confusion, depression and sleep disturbance. The patient is not nervous/anxious.     PHYSICAL EXAMINATION:  Blood pressure 104/80, pulse 100, temperature 98.1 F (36.7 C), temperature source Tympanic, resp. rate 18, height 5' 1.6" (1.565 m), weight 247 lb 14.4 oz (112.4 kg), SpO2 100 %.  ECOG PERFORMANCE STATUS: 1 - Symptomatic but completely ambulatory  Physical Exam  Constitutional: Oriented to person, place, and time and well-developed, well-nourished, and in no distress.   HENT:  Head: Normocephalic and atraumatic.  Mouth/Throat: Oropharynx is clear and moist. No oropharyngeal exudate.  Eyes: Conjunctivae are normal. Right eye exhibits no discharge. Left eye exhibits no discharge. No scleral icterus.  Neck: Normal range of motion. Neck supple.  Cardiovascular: Normal rate, regular rhythm, normal heart sounds and intact distal pulses.   Pulmonary/Chest: Effort normal and breath sounds normal. No respiratory distress. No wheezes. No rales.  Abdominal: Soft. Bowel sounds are normal. Exhibits no distension and no mass. There is no tenderness.  Musculoskeletal: Normal range of motion. Exhibits no edema.  Lymphadenopathy:    No cervical adenopathy.  Neurological: Alert and oriented to person, place, and time.  Exhibits normal muscle tone. Gait normal. Coordination normal.  Skin: Skin is warm and dry. No rash noted. Not diaphoretic. No erythema. No pallor.  Psychiatric: Mood, memory and judgment normal.  Breast: Left status post mastectomy. Right breast benign. Bilateral axilla is benign Vitals reviewed.  LABORATORY DATA: Lab Results  Component Value Date   WBC 8.8 08/01/2020   HGB 10.3 (L) 08/01/2020   HCT 31.6 (L) 08/01/2020   MCV 98.8 08/01/2020   PLT 267 08/01/2020      Chemistry      Component Value Date/Time   NA 136 07/11/2020 0945   NA 143 12/07/2019 1742   NA 142 11/13/2016 1050   K 4.4 07/11/2020 0945   K 4.1 11/13/2016 1050   CL 102 07/11/2020 0945   CO2 22 07/11/2020 0945   CO2 27 11/13/2016 1050   BUN 24 (H) 07/11/2020 0945   BUN 13 12/07/2019 1742   BUN 11.5 11/13/2016  1050   CREATININE 1.05 (H) 07/11/2020 0945   CREATININE 0.86 01/30/2020 0946   CREATININE 1.0 11/13/2016 1050      Component Value Date/Time   CALCIUM 10.1 07/11/2020 0945   CALCIUM 9.8 11/13/2016 1050   ALKPHOS 166 (H) 07/11/2020 0945   ALKPHOS 140 11/13/2016 1050   AST 22 07/11/2020 0945   AST 12 (L) 01/30/2020 0946   AST 13 11/13/2016 1050   ALT 53 (H) 07/11/2020 0945   ALT 14 01/30/2020 0946   ALT 15 11/13/2016 1050   BILITOT 1.4 (H) 07/11/2020 0945   BILITOT 1.3 (H) 01/30/2020 0946   BILITOT 0.86 11/13/2016 1050       RADIOGRAPHIC STUDIES:  No results found.   ASSESSMENT/PLAN:  69 y.o. BRCA negative [place] woman s/p left breast upper outer quadrant and left axillary lymph node biopsy 02/22/2014, both positive for a clinical T2 N2, stage IIIA invasive ductal carcinoma, grade 3, triple negative, with an MIB-1 of 90%  (1) neoadjuvant chemotherapy started 03/13/2014, with cyclophosphamide and doxorubicin in dose dense fashion x4, with Neulasta support on day 2, completed 04/24/2014, followed by weekly carboplatin and paclitaxel x12, paclitaxel reduced during cycle 5 and cycle 7  because of elevated bilirubin levels  (2) left lumpectomy and sentinel lymph node sampling 09/20/2014 showed a complete pathologic response.  (3) radiation completed April 2016  (4) remote history of partial colectomy for early stage colon cancer incidentally found during TAH/BSO in the 1980s  (5) genetics testing since 04/03/2014, demonstrated a pathogenic mutation in the PALB2 gene             (a) other genes tested through the [employer] gene panel, [employer], showed no deleterious mutations.             (b) PALB2 mutations are known to increase the risk of breast and pancreatic cancer, possibly other cancers, but the data is preliminary             (c) the patient's 2 daughters have been tested and did not carry the gene             (d) [name]'s MSH2 VUS has been reclassified as "likely benign"             (e) she is status post remote TAH/BSO             (f) intensified screening, with mammography in February and breast MRI in August planned  (6) staging studies showed right-sided lung nodules, too small to characterize, and a dominant right-sided thyroid nodule unchanged in size as compared to 2011; to be followed             (a) chest CT 04/24/2016 showed the small pulmonary nodules to be completely stable, consistent with benign findings.             (b) thyroid ultrasound--done on 07/22/2018 showed enlarged heterogeneous lobular and multinodular thyroid gland, TI-RADS category 3 nodules, not meeting criteria for further biopsy or dedicated imaging f/u.    (7) intraductal papilloma biopsied right breast 11/06/2017             (a) status post right lumpectomy 12/21/2017 showing only ductal papilloma, no evidence of malignancy.  (8) anastrozole started 07/08/2018 for breast cancer prevention, discontinued 01/30/2020 with disease recurrence             (a) bone density scan 01/01/2018 normal, T score -0.6  (9) RECURRENT DISEASE May 2021             (  a) left breast overlapping  biopsy 01/20/2020 shows a clinical  T2 N0, stage IIB invasive ductal carcinoma, grade 2 or 3, with moderate estrogen receptor positivity, progesterone receptor negative, HER-2 not amplified, MIB-1 of 80%             (b) Left Mastectomy on 02/14/2020: IDC, grade 3, 2.3 cm, margins negative, T2, NX.              (c) CT of the chest and bone scan on 02/08/2020 shows no distant metastases  (10) started fulvestrant and palbociclib 02/24/2020 discontinued on 03/08/2020             (a) Oncotype sent on surgical sample shows score of 60 indicating a greater than 29% risk of recurrence on antiestrogens alone, and also predicting a significant benefit from chemotherapy.             (b) the Oncotype test found this tumor to be triple negative by single gene scores.              (11) CMF started on 03/27/2020, to be repeated every 21 days x 8   PLAN: Maquita continues on treatment with CMF every 3 weeks and tolerates this well. Her labs today so far are stable. Labs were reviewed with the patient. Recommend that she proceed with cycle #7 today as scheduled. She will continue on this treatment for 8 cycles.     Jezreel will return in 3 weeks for labs, f/u, and her next chemotherapy.  She knows to call for any questions that may arise between now and her next appointment.  We are happy to see her sooner if needed.    No orders of the defined types were placed in this encounter.    Joseantonio Dittmar L Alese Furniss, PA-C 08/01/20

## 2020-08-01 ENCOUNTER — Other Ambulatory Visit: Payer: Self-pay | Admitting: Oncology

## 2020-08-01 ENCOUNTER — Other Ambulatory Visit: Payer: Self-pay

## 2020-08-01 ENCOUNTER — Inpatient Hospital Stay: Payer: PPO | Attending: Oncology

## 2020-08-01 ENCOUNTER — Inpatient Hospital Stay (HOSPITAL_BASED_OUTPATIENT_CLINIC_OR_DEPARTMENT_OTHER): Payer: PPO | Admitting: Physician Assistant

## 2020-08-01 ENCOUNTER — Inpatient Hospital Stay: Payer: PPO

## 2020-08-01 VITALS — BP 104/80 | HR 100 | Temp 98.1°F | Resp 18 | Ht 61.6 in | Wt 247.9 lb

## 2020-08-01 DIAGNOSIS — C773 Secondary and unspecified malignant neoplasm of axilla and upper limb lymph nodes: Secondary | ICD-10-CM | POA: Diagnosis not present

## 2020-08-01 DIAGNOSIS — Z95828 Presence of other vascular implants and grafts: Secondary | ICD-10-CM

## 2020-08-01 DIAGNOSIS — C50412 Malignant neoplasm of upper-outer quadrant of left female breast: Secondary | ICD-10-CM

## 2020-08-01 DIAGNOSIS — Z1502 Genetic susceptibility to malignant neoplasm of ovary: Secondary | ICD-10-CM

## 2020-08-01 DIAGNOSIS — Z171 Estrogen receptor negative status [ER-]: Secondary | ICD-10-CM | POA: Diagnosis not present

## 2020-08-01 DIAGNOSIS — Z5111 Encounter for antineoplastic chemotherapy: Secondary | ICD-10-CM | POA: Insufficient documentation

## 2020-08-01 DIAGNOSIS — C50919 Malignant neoplasm of unspecified site of unspecified female breast: Secondary | ICD-10-CM

## 2020-08-01 LAB — CBC WITH DIFFERENTIAL/PLATELET
Abs Immature Granulocytes: 0.19 10*3/uL — ABNORMAL HIGH (ref 0.00–0.07)
Basophils Absolute: 0.1 10*3/uL (ref 0.0–0.1)
Basophils Relative: 1 %
Eosinophils Absolute: 0.1 10*3/uL (ref 0.0–0.5)
Eosinophils Relative: 1 %
HCT: 31.6 % — ABNORMAL LOW (ref 36.0–46.0)
Hemoglobin: 10.3 g/dL — ABNORMAL LOW (ref 12.0–15.0)
Immature Granulocytes: 2 %
Lymphocytes Relative: 8 %
Lymphs Abs: 0.7 10*3/uL (ref 0.7–4.0)
MCH: 32.2 pg (ref 26.0–34.0)
MCHC: 32.6 g/dL (ref 30.0–36.0)
MCV: 98.8 fL (ref 80.0–100.0)
Monocytes Absolute: 0.7 10*3/uL (ref 0.1–1.0)
Monocytes Relative: 8 %
Neutro Abs: 7 10*3/uL (ref 1.7–7.7)
Neutrophils Relative %: 80 %
Platelets: 267 10*3/uL (ref 150–400)
RBC: 3.2 MIL/uL — ABNORMAL LOW (ref 3.87–5.11)
RDW: 14.6 % (ref 11.5–15.5)
WBC: 8.8 10*3/uL (ref 4.0–10.5)
nRBC: 0.9 % — ABNORMAL HIGH (ref 0.0–0.2)

## 2020-08-01 LAB — COMPREHENSIVE METABOLIC PANEL
ALT: 44 U/L (ref 0–44)
AST: 15 U/L (ref 15–41)
Albumin: 3.2 g/dL — ABNORMAL LOW (ref 3.5–5.0)
Alkaline Phosphatase: 122 U/L (ref 38–126)
Anion gap: 13 (ref 5–15)
BUN: 22 mg/dL (ref 8–23)
CO2: 24 mmol/L (ref 22–32)
Calcium: 10.1 mg/dL (ref 8.9–10.3)
Chloride: 102 mmol/L (ref 98–111)
Creatinine, Ser: 1.07 mg/dL — ABNORMAL HIGH (ref 0.44–1.00)
GFR, Estimated: 56 mL/min — ABNORMAL LOW (ref >60)
Glucose, Bld: 117 mg/dL — ABNORMAL HIGH (ref 70–99)
Potassium: 4.4 mmol/L (ref 3.5–5.1)
Sodium: 139 mmol/L (ref 135–145)
Total Bilirubin: 1 mg/dL (ref 0.3–1.2)
Total Protein: 6.8 g/dL (ref 6.5–8.1)

## 2020-08-01 MED ORDER — SODIUM CHLORIDE 0.9 % IV SOLN
Freq: Once | INTRAVENOUS | Status: AC
  Filled 2020-08-01: qty 250

## 2020-08-01 MED ORDER — SODIUM CHLORIDE 0.9 % IV SOLN
600.0000 mg/m2 | Freq: Once | INTRAVENOUS | Status: AC
  Administered 2020-08-01: 1300 mg via INTRAVENOUS
  Filled 2020-08-01: qty 65

## 2020-08-01 MED ORDER — PALONOSETRON HCL INJECTION 0.25 MG/5ML
INTRAVENOUS | Status: AC
  Filled 2020-08-01: qty 5

## 2020-08-01 MED ORDER — FLUOROURACIL CHEMO INJECTION 2.5 GM/50ML
600.0000 mg/m2 | Freq: Once | INTRAVENOUS | Status: AC
  Administered 2020-08-01: 1300 mg via INTRAVENOUS
  Filled 2020-08-01: qty 26

## 2020-08-01 MED ORDER — SODIUM CHLORIDE 0.9% FLUSH
10.0000 mL | Freq: Once | INTRAVENOUS | Status: AC
  Administered 2020-08-01: 10 mL
  Filled 2020-08-01: qty 10

## 2020-08-01 MED ORDER — HEPARIN SOD (PORK) LOCK FLUSH 100 UNIT/ML IV SOLN
500.0000 [IU] | Freq: Once | INTRAVENOUS | Status: AC | PRN
  Administered 2020-08-01: 500 [IU]
  Filled 2020-08-01: qty 5

## 2020-08-01 MED ORDER — METHOTREXATE SODIUM (PF) CHEMO INJECTION 250 MG/10ML
40.0000 mg/m2 | Freq: Once | INTRAMUSCULAR | Status: AC
  Administered 2020-08-01: 86 mg via INTRAVENOUS
  Filled 2020-08-01: qty 3.44

## 2020-08-01 MED ORDER — SODIUM CHLORIDE 0.9 % IV SOLN
10.0000 mg | Freq: Once | INTRAVENOUS | Status: AC
  Administered 2020-08-01: 10 mg via INTRAVENOUS
  Filled 2020-08-01: qty 10

## 2020-08-01 MED ORDER — PALONOSETRON HCL INJECTION 0.25 MG/5ML
0.2500 mg | Freq: Once | INTRAVENOUS | Status: AC
  Administered 2020-08-01: 0.25 mg via INTRAVENOUS

## 2020-08-01 MED ORDER — SODIUM CHLORIDE 0.9% FLUSH
10.0000 mL | INTRAVENOUS | Status: DC | PRN
  Administered 2020-08-01: 10 mL
  Filled 2020-08-01: qty 10

## 2020-08-01 NOTE — Patient Instructions (Signed)
[facility] Discharge Instructions for Patients Receiving Chemotherapy  Today you received the following chemotherapy agents: cytoxan, methotrexate, fluorouracil  To help prevent nausea and vomiting after your treatment, we encourage you to take your nausea medication as directed.    If you develop nausea and vomiting that is not controlled by your nausea medication, call the clinic.   BELOW ARE SYMPTOMS THAT SHOULD BE REPORTED IMMEDIATELY:  *FEVER GREATER THAN 100.5 F  *CHILLS WITH OR WITHOUT FEVER  NAUSEA AND VOMITING THAT IS NOT CONTROLLED WITH YOUR NAUSEA MEDICATION  *UNUSUAL SHORTNESS OF BREATH  *UNUSUAL BRUISING OR BLEEDING  TENDERNESS IN MOUTH AND THROAT WITH OR WITHOUT PRESENCE OF ULCERS  *URINARY PROBLEMS  *BOWEL PROBLEMS  UNUSUAL RASH Items with * indicate a potential emergency and should be followed up as soon as possible.  Feel free to call the clinic should you have any questions or concerns. The clinic phone number is [phone].  Please show the CHEMO ALERT CARD at check-in to the Emergency Department and triage nurse.   

## 2020-08-01 NOTE — Patient Instructions (Signed)

## 2020-08-11 ENCOUNTER — Other Ambulatory Visit: Payer: Self-pay | Admitting: Adult Health

## 2020-08-11 DIAGNOSIS — C50412 Malignant neoplasm of upper-outer quadrant of left female breast: Secondary | ICD-10-CM

## 2020-08-22 ENCOUNTER — Other Ambulatory Visit: Payer: Self-pay | Admitting: Oncology

## 2020-08-22 ENCOUNTER — Inpatient Hospital Stay: Payer: PPO

## 2020-08-22 ENCOUNTER — Other Ambulatory Visit: Payer: Self-pay

## 2020-08-22 ENCOUNTER — Inpatient Hospital Stay (HOSPITAL_BASED_OUTPATIENT_CLINIC_OR_DEPARTMENT_OTHER): Payer: PPO | Admitting: Adult Health

## 2020-08-22 ENCOUNTER — Inpatient Hospital Stay: Payer: PPO | Attending: Oncology

## 2020-08-22 ENCOUNTER — Encounter: Payer: Self-pay | Admitting: Adult Health

## 2020-08-22 VITALS — HR 92

## 2020-08-22 VITALS — BP 121/86 | HR 107 | Temp 98.6°F | Resp 20 | Ht 61.6 in | Wt 248.5 lb

## 2020-08-22 DIAGNOSIS — Z5111 Encounter for antineoplastic chemotherapy: Secondary | ICD-10-CM | POA: Diagnosis not present

## 2020-08-22 DIAGNOSIS — C773 Secondary and unspecified malignant neoplasm of axilla and upper limb lymph nodes: Secondary | ICD-10-CM | POA: Insufficient documentation

## 2020-08-22 DIAGNOSIS — Z1589 Genetic susceptibility to other disease: Secondary | ICD-10-CM

## 2020-08-22 DIAGNOSIS — C50412 Malignant neoplasm of upper-outer quadrant of left female breast: Secondary | ICD-10-CM | POA: Diagnosis not present

## 2020-08-22 DIAGNOSIS — Z95828 Presence of other vascular implants and grafts: Secondary | ICD-10-CM

## 2020-08-22 DIAGNOSIS — Z79899 Other long term (current) drug therapy: Secondary | ICD-10-CM | POA: Diagnosis not present

## 2020-08-22 DIAGNOSIS — Z171 Estrogen receptor negative status [ER-]: Secondary | ICD-10-CM | POA: Diagnosis not present

## 2020-08-22 DIAGNOSIS — C50919 Malignant neoplasm of unspecified site of unspecified female breast: Secondary | ICD-10-CM

## 2020-08-22 LAB — COMPREHENSIVE METABOLIC PANEL
ALT: 27 U/L (ref 0–44)
AST: 12 U/L — ABNORMAL LOW (ref 15–41)
Albumin: 3.3 g/dL — ABNORMAL LOW (ref 3.5–5.0)
Alkaline Phosphatase: 113 U/L (ref 38–126)
Anion gap: 11 (ref 5–15)
BUN: 25 mg/dL — ABNORMAL HIGH (ref 8–23)
CO2: 23 mmol/L (ref 22–32)
Calcium: 10.2 mg/dL (ref 8.9–10.3)
Chloride: 104 mmol/L (ref 98–111)
Creatinine, Ser: 1.27 mg/dL — ABNORMAL HIGH (ref 0.44–1.00)
GFR, Estimated: 46 mL/min — ABNORMAL LOW (ref >60)
Glucose, Bld: 100 mg/dL — ABNORMAL HIGH (ref 70–99)
Potassium: 4.1 mmol/L (ref 3.5–5.1)
Sodium: 138 mmol/L (ref 135–145)
Total Bilirubin: 1 mg/dL (ref 0.3–1.2)
Total Protein: 6.9 g/dL (ref 6.5–8.1)

## 2020-08-22 LAB — CBC WITH DIFFERENTIAL/PLATELET
Abs Immature Granulocytes: 0.36 10*3/uL — ABNORMAL HIGH (ref 0.00–0.07)
Basophils Absolute: 0.1 10*3/uL (ref 0.0–0.1)
Basophils Relative: 1 %
Eosinophils Absolute: 0 10*3/uL (ref 0.0–0.5)
Eosinophils Relative: 0 %
HCT: 31.5 % — ABNORMAL LOW (ref 36.0–46.0)
Hemoglobin: 10.5 g/dL — ABNORMAL LOW (ref 12.0–15.0)
Immature Granulocytes: 4 %
Lymphocytes Relative: 11 %
Lymphs Abs: 1 10*3/uL (ref 0.7–4.0)
MCH: 31.8 pg (ref 26.0–34.0)
MCHC: 33.3 g/dL (ref 30.0–36.0)
MCV: 95.5 fL (ref 80.0–100.0)
Monocytes Absolute: 0.8 10*3/uL (ref 0.1–1.0)
Monocytes Relative: 9 %
Neutro Abs: 6.8 10*3/uL (ref 1.7–7.7)
Neutrophils Relative %: 75 %
Platelets: 222 10*3/uL (ref 150–400)
RBC: 3.3 MIL/uL — ABNORMAL LOW (ref 3.87–5.11)
RDW: 14.2 % (ref 11.5–15.5)
WBC: 9 10*3/uL (ref 4.0–10.5)
nRBC: 0.9 % — ABNORMAL HIGH (ref 0.0–0.2)

## 2020-08-22 MED ORDER — SODIUM CHLORIDE 0.9% FLUSH
10.0000 mL | INTRAVENOUS | Status: DC | PRN
  Administered 2020-08-22: 10 mL
  Filled 2020-08-22: qty 10

## 2020-08-22 MED ORDER — SODIUM CHLORIDE 0.9 % IV SOLN
Freq: Once | INTRAVENOUS | Status: AC
  Filled 2020-08-22: qty 250

## 2020-08-22 MED ORDER — FLUOROURACIL CHEMO INJECTION 2.5 GM/50ML
600.0000 mg/m2 | Freq: Once | INTRAVENOUS | Status: AC
  Administered 2020-08-22: 1300 mg via INTRAVENOUS
  Filled 2020-08-22: qty 26

## 2020-08-22 MED ORDER — SODIUM CHLORIDE 0.9 % IV SOLN
600.0000 mg/m2 | Freq: Once | INTRAVENOUS | Status: AC
  Administered 2020-08-22: 1300 mg via INTRAVENOUS
  Filled 2020-08-22: qty 65

## 2020-08-22 MED ORDER — METHOTREXATE SODIUM (PF) CHEMO INJECTION 250 MG/10ML
40.0000 mg/m2 | Freq: Once | INTRAMUSCULAR | Status: AC
  Administered 2020-08-22: 86 mg via INTRAVENOUS
  Filled 2020-08-22: qty 3.44

## 2020-08-22 MED ORDER — SODIUM CHLORIDE 0.9% FLUSH
10.0000 mL | Freq: Once | INTRAVENOUS | Status: AC
  Administered 2020-08-22: 10 mL
  Filled 2020-08-22: qty 10

## 2020-08-22 MED ORDER — PALONOSETRON HCL INJECTION 0.25 MG/5ML
0.2500 mg | Freq: Once | INTRAVENOUS | Status: AC
  Administered 2020-08-22: 0.25 mg via INTRAVENOUS

## 2020-08-22 MED ORDER — SODIUM CHLORIDE 0.9 % IV SOLN
10.0000 mg | Freq: Once | INTRAVENOUS | Status: AC
  Administered 2020-08-22: 10 mg via INTRAVENOUS
  Filled 2020-08-22: qty 10

## 2020-08-22 MED ORDER — HEPARIN SOD (PORK) LOCK FLUSH 100 UNIT/ML IV SOLN
500.0000 [IU] | Freq: Once | INTRAVENOUS | Status: AC | PRN
  Administered 2020-08-22: 500 [IU]
  Filled 2020-08-22: qty 5

## 2020-08-22 MED ORDER — PALONOSETRON HCL INJECTION 0.25 MG/5ML
INTRAVENOUS | Status: AC
  Filled 2020-08-22: qty 5

## 2020-08-22 NOTE — Patient Instructions (Signed)
[facility] Discharge Instructions for Patients Receiving Chemotherapy  Today you received the following chemotherapy agents: cytoxan, methotrexate, fluorouracil  To help prevent nausea and vomiting after your treatment, we encourage you to take your nausea medication as directed.    If you develop nausea and vomiting that is not controlled by your nausea medication, call the clinic.   BELOW ARE SYMPTOMS THAT SHOULD BE REPORTED IMMEDIATELY:  *FEVER GREATER THAN 100.5 F  *CHILLS WITH OR WITHOUT FEVER  NAUSEA AND VOMITING THAT IS NOT CONTROLLED WITH YOUR NAUSEA MEDICATION  *UNUSUAL SHORTNESS OF BREATH  *UNUSUAL BRUISING OR BLEEDING  TENDERNESS IN MOUTH AND THROAT WITH OR WITHOUT PRESENCE OF ULCERS  *URINARY PROBLEMS  *BOWEL PROBLEMS  UNUSUAL RASH Items with * indicate a potential emergency and should be followed up as soon as possible.  Feel free to call the clinic should you have any questions or concerns. The clinic phone number is [phone].  Please show the CHEMO ALERT CARD at check-in to the Emergency Department and triage nurse.   

## 2020-08-22 NOTE — Progress Notes (Signed)
[facility]  [phone] Fax:(336) [phone]     ID: [name] DOB: [birth year]  MR#: [id number]  PIR#:[id number]  Patient Care Team: Minette Brine, [place] as PCP - General (General Practice) Magrinat, Virgie Dad, MD as Consulting Physician (Oncology) Rolm Bookbinder, MD as Consulting Physician (General Surgery) Gery Pray, MD as Consulting Physician (Radiation Oncology) [name], MD as Consulting Physician (Ophthalmology)   CHIEF COMPLAINT: triple negative breast cancer; PALB2 positive (s/p left mastectomy)  CURRENT TREATMENT: CMF   INTERVAL HISTORY: [name] returns today for follow up and treatment of her now-recurrent left breast cancer.  She started on adjuvant CMF chemotherapy given on day 1 of a 21 day cycle.  She is here for her 8th and final cycle of treatment.  She tolerates this well with no issues.    REVIEW OF SYSTEMS: [name] is doing quite well.  She tells me that she has no current issues.  She has mild shortness of breath with activity, and attributes that to her weight gain.  A detailed ROS was otherwise non contributory today.     BREAST CANCER HISTORY: From the original intake note:  [name] herself palpated a mass in her left breast, and immediately brought it to the attention of her primary care physician, Dr. Baird Cancer, who confirmed the finding and setup the patient for bilateral diagnostic mammography at the breast Center 02/20/2014. This showed a high density mass in the outer left breast measuring up to 3.7 cm. It corresponded to the site of palpable concern. In addition the normal 4 logically abnormal lymph nodes in the left axilla. The mass was palpable by exam, and by ultrasonography measured 2.6 cm. There were enlarged and morphologically abnormal lymph nodes in the left axilla, largest measuring 3 cm.  Biopsy of the left breast mass in question and 1 of the abnormal axillary lymph nodes 02/22/2014 showed ([id number])  biopsies to be positive for an invasive ductal carcinoma, grade 3, triple negative, with an MIB-1 of 90%. Specifically the HER-2 signals ratio was 1.05, and the number per cell 2.00.  MRI of the breasts 03/01/2014 showed a 3.8 cm enhancing mass with areas of apparent necrosis in the left breast, as well as the biopsy tract extending laterally, the combined measure 5.3 cm. There were no other masses or areas of abnormal enhancement. There were multiple abnormally enlarged left axillary lymph nodes with loss of the normal fatty hilum. These included level I and retropectoral lymph nodes.  The patient's subsequent history is as detailed below   PAST MEDICAL HISTORY: Past Medical History:  Diagnosis Date  . Colon cancer ([place])   . Hx of rotator cuff surgery   . Hypertension   . left breast cancer   . Personal history of chemotherapy 2016   left  . Personal history of radiation therapy 2016   left breast   . Radiation 11/15/14-01/02/15   left breast, axillary and supraclavicular region 45 gray, lumpectomy cavity boosted to 16 gray    PAST SURGICAL HISTORY: Past Surgical History:  Procedure Laterality Date  . ABDOMINAL HYSTERECTOMY  1988  . BREAST EXCISIONAL BIOPSY Right 12/21/2017  . BREAST LUMPECTOMY Left 2016  . COLON SURGERY  1988   colectomy/colostomy-ca  . COLON SURGERY  1988   colostomy takedown-reversal  . COLONOSCOPY    . EXCISION / BIOPSY BREAST / NIPPLE / DUCT Left 09/20/2014  . IR IMAGING GUIDED PORT INSERTION  03/22/2020  . MASTECTOMY Left 02/14/2020  . MASTECTOMY W/ SENTINEL NODE BIOPSY  Left 02/14/2020   Procedure: LEFT MASTECTOMY WITH BLUE DYE INJECTION;  Surgeon: Rolm Bookbinder, MD;  Location: [facility];  Service: General;  Laterality: Left;  PEC BLOCK  . RADIOACTIVE SEED GUIDED EXCISIONAL BREAST BIOPSY Right 12/21/2017   Procedure: RIGHT RADIOACTIVE SEED GUIDED EXCISIONAL BREAST BIOPSY ERAS PATHWAY;  Surgeon: Rolm Bookbinder, MD;  Location: [facility];   Service: General;  Laterality: Right;  . RADIOACTIVE SEED GUIDED PARTIAL MASTECTOMY WITH AXILLARY SENTINEL LYMPH NODE BIOPSY Left 09/20/2014   Procedure: RADIOACTIVE SEED GUIDED LEFT BREAST LUMPECTOMY WITH AXILLARY SENTINEL LYMPH NODE BIOPSY;  Surgeon: Rolm Bookbinder, MD;  Location: [facility];  Service: General;  Laterality: Left;  . TOTAL SHOULDER ARTHROPLASTY  2009   right    FAMILY HISTORY Family History  Problem Relation Age of Onset  . Cancer Mother 27       breast  . Breast cancer Mother        early 48s  . Stroke Father 8       cause of death  . Diabetes Father   . Cancer Maternal Aunt        breast cancer at unknown age  . Breast cancer Maternal Aunt   . Ovarian cancer Neg Hx    the patient's father died at the age of 10 following a stroke in the setting of diabetes; the patient's mother is living at 89. The patient has 3 brothers and 2 sisters. [name], [name], was diagnosed with breast cancer in her early 109s. There is no other breast or ovarian cancer in the family. The patient herself was diagnosed with watts by history he is an incidentally found stage I colon carcinoma noted at the time of her hysterectomy. She had a partial colectomy but no adjuvant treatment. We have no records regarding that procedure   GYNECOLOGIC HISTORY:  No LMP recorded. Patient has had a hysterectomy. Menarche age 23, first live birth age 89, the patient underwent total abdominal hysterectomy with bilateral salpingo-oophorectomy in her 23s. She did not take hormone replacement. She did not take oral contraceptives at any point.   SOCIAL HISTORY: (Updated May 2021 [name] worked for [employer] for about 40 years, retiring in 2008. She is divorced and lives alone, with no pets.  Her grandson who is a Conservator, museum/gallery frequently stays with her.  Her daughter [name] works for [employer] in [employer]. The second daughter, [name], works as a  Scientist, water quality at [employer].  The patient has 6 grandchildren and three great-grandchildren. She attends a [employer]. (updated 09/28/2018)    ADVANCED DIRECTIVES: Not in place. The patient intends to name her daughter [name]. her healthcare power of attorney. [name] is work number is [phone]. On the patient's 03/01/2014 visit she was given a copy of the appropriate documents to complete and notarize at her discretion.     HEALTH MAINTENANCE: Social History   Tobacco Use  . Smoking status: Never Smoker  . Smokeless tobacco: Never Used  Vaping Use  . Vaping Use: Never used  Substance Use Topics  . Alcohol use: Not Currently    Comment: occ-rare  . Drug use: No     Colonoscopy: 2000  PAP: May 2015  Bone density: 12/21/2017, T-score of -0.6  Lipid panel: 02/03/2014  No Known Allergies  Current Outpatient Medications  Medication Sig Dispense Refill  . acetaminophen (TYLENOL) 500 MG tablet Take 1,000 mg by mouth every 6 (six) hours as needed for mild pain  or moderate pain.    Marland Kitchen dexamethasone (DECADRON) 4 MG tablet TAKE 1 TABLET DAILY X 2 DAYS AFTER CHEMO TREATMENT. 30 tablet 1  . gabapentin (NEURONTIN) 300 MG capsule Take 1 capsule (300 mg total) by mouth at bedtime. 90 capsule 4  . hydroxypropyl methylcellulose / hypromellose (ISOPTO TEARS / GONIOVISC) 2.5 % ophthalmic solution Place 1 drop into both eyes daily as needed for dry eyes.    Marland Kitchen lidocaine-prilocaine (EMLA) cream Apply to affected area once 30 g 3  . Liniments (SALONPAS ARTHRITIS PAIN RELIEF EX) Apply 1 application topically daily as needed (knee pain).    Marland Kitchen lisinopril-hydrochlorothiazide (ZESTORETIC) 20-12.5 MG tablet Take 1/2 tablet daily by mouth 45 tablet 6  . LUMIGAN 0.01 % SOLN     . Multiple Vitamins-Minerals (AIRBORNE PO) Take 1 tablet by mouth 2 (two) times a week.    . prochlorperazine (COMPAZINE) 10 MG tablet Take 1 tablet (10 mg total) by mouth every 6 (six) hours as needed (Nausea or vomiting). 30 tablet 1   . ursodiol (ACTIGALL) 500 MG tablet Take 500 mg by mouth 3 (three) times daily.      No current facility-administered medications for this visit.    OBJECTIVE: Vitals:   08/22/20 0945  BP: 121/86  Pulse: (!) 107  Resp: 20  Temp: 98.6 F (37 C)  SpO2: 100%    Body mass index is 46.04 kg/m.     ECOG FS:1 - Symptomatic but completely ambulatory GENERAL: Patient is a well appearing female in no acute distress HEENT:  Sclerae anicteric.  Mask in place. Neck is supple.  NODES:  No cervical, supraclavicular, or axillary lymphadenopathy palpated.  BREAST EXAM:  Deferred LUNGS:  Clear to auscultation bilaterally.  No wheezes or rhonchi. HEART:  Regular rate and rhythm. No murmur appreciated. ABDOMEN:  Soft, nontender.  Positive, normoactive bowel sounds. No organomegaly palpated. MSK:  No focal spinal tenderness to palpation. Full range of motion bilaterally in the upper extremities. EXTREMITIES:  No peripheral edema.   SKIN:  Clear with no obvious rashes or skin changes. No nail dyscrasia. NEURO:  Nonfocal. Well oriented.  Appropriate affect.    LAB RESULTS:  No results found for: SPEP  Lab Results  Component Value Date   WBC 9.0 08/22/2020   NEUTROABS 6.8 08/22/2020   HGB 10.5 (L) 08/22/2020   HCT 31.5 (L) 08/22/2020   MCV 95.5 08/22/2020   PLT 222 08/22/2020      Chemistry      Component Value Date/Time   NA 139 08/01/2020 1040   NA 143 12/07/2019 1742   NA 142 11/13/2016 1050   K 4.4 08/01/2020 1040   K 4.1 11/13/2016 1050   CL 102 08/01/2020 1040   CO2 24 08/01/2020 1040   CO2 27 11/13/2016 1050   BUN 22 08/01/2020 1040   BUN 13 12/07/2019 1742   BUN 11.5 11/13/2016 1050   CREATININE 1.07 (H) 08/01/2020 1040   CREATININE 0.86 01/30/2020 0946   CREATININE 1.0 11/13/2016 1050      Component Value Date/Time   CALCIUM 10.1 08/01/2020 1040   CALCIUM 9.8 11/13/2016 1050   ALKPHOS 122 08/01/2020 1040   ALKPHOS 140 11/13/2016 1050   AST 15 08/01/2020 1040    AST 12 (L) 01/30/2020 0946   AST 13 11/13/2016 1050   ALT 44 08/01/2020 1040   ALT 14 01/30/2020 0946   ALT 15 11/13/2016 1050   BILITOT 1.0 08/01/2020 1040   BILITOT 1.3 (H) 01/30/2020 6222  BILITOT 0.86 11/13/2016 1050       No results found for: LABCA2  No components found for: ULAGT364  No results for input(s): INR in the last 168 hours.  Urinalysis    Component Value Date/Time   COLORURINE YELLOW 04/05/2008 1120   APPEARANCEUR CLEAR 04/05/2008 1120   LABSPEC 1.021 04/05/2008 1120   PHURINE 6.0 04/05/2008 1120   GLUCOSEU NEGATIVE 04/05/2008 1120   HGBUR NEGATIVE 04/05/2008 1120   BILIRUBINUR negative 12/07/2019 1212   KETONESUR NEGATIVE 04/05/2008 1120   PROTEINUR Negative 12/07/2019 1212   PROTEINUR NEGATIVE 04/05/2008 1120   UROBILINOGEN 1.0 12/07/2019 1212   UROBILINOGEN 0.2 04/05/2008 1120   NITRITE negative 12/07/2019 1212   NITRITE NEGATIVE 04/05/2008 1120   LEUKOCYTESUR Negative 12/07/2019 1212    STUDIES: No results found.    ASSESSMENT: 69 y.o. BRCA negative [place] woman s/p left breast upper outer quadrant and left axillary lymph node biopsy 02/22/2014, both positive for a clinical T2 N2, stage IIIA invasive ductal carcinoma, grade 3, triple negative, with an MIB-1 of 90%  (1) neoadjuvant chemotherapy started 03/13/2014, with cyclophosphamide and doxorubicin in dose dense fashion x4, with Neulasta support on day 2, completed 04/24/2014, followed by weekly carboplatin and paclitaxel x12, paclitaxel reduced during cycle 5 and cycle 7 because of elevated bilirubin levels  (2) left lumpectomy and sentinel lymph node sampling 09/20/2014 showed a complete pathologic response.  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  (b) PALB2 mutations are known to increase the risk of breast and pancreatic cancer, possibly other cancers, but the data is preliminary  (c) the patient's 2 daughters have been tested and did not carry the gene  (d) Matika's MSH2 VUS has been reclassified as "likely benign"  (e) she is status post remote TAH/BSO  (f) intensified screening, with mammography in February and breast MRI in August planned  (6) staging studies showed right-sided lung nodules, too small to characterize, and a dominant right-sided thyroid nodule unchanged in size as compared to 2011; to be followed  (a) chest CT 04/24/2016 showed the small pulmonary nodules to be completely stable, consistent with benign findings.  (b) thyroid ultrasound--done on 07/22/2018 showed enlarged heterogeneous lobular and multinodular thyroid gland, TI-RADS category 3 nodules, not meeting criteria for further biopsy or dedicated imaging f/u.    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  (8) anastrozole started 07/08/2018 for breast cancer prevention, discontinued 01/30/2020 with disease recurrence  (a) bone density scan 01/01/2018 normal, T score -0.6  (9) RECURRENT DISEASE May 2021  (a) left breast overlapping biopsy 01/20/2020 shows a clinical  T2 N0, stage IIB invasive ductal carcinoma, grade 2 or 3, with moderate estrogen receptor positivity, progesterone receptor negative, HER-2 not amplified, MIB-1 of 80%  (b) Left Mastectomy on 02/14/2020: IDC, grade 3, 2.3 cm, margins negative, T2, NX.   (c) CT of the chest and bone scan on 02/08/2020 shows no distant metastases  (10) started fulvestrant and palbociclib 02/24/2020 discontinued on 03/08/2020  (a) Oncotype sent on surgical sample shows score of 60 indicating a greater than 29% risk of recurrence on  antiestrogens alone, and also predicting a significant  benefit from chemotherapy.  (b) the Oncotype test found this tumor to be triple negative by single gene scores.   (11) CMF x 8 cycles given from 03/27/2020-08/22/2020.   PLAN: Dasani continues on treatment with CMF and will complete her final cycle today.  She was recommended to continue with being active, and healthy diet and she plans on focusing on weight loss after her christmas dinner.    She will return in 6 weeks for labs, f/u with Dr. Jana Hakim.  I talked with him, and she will resume fulvestrant in 6 weeks, however she will not resume Palbociclib at that time.    We will see her back as noted above.  She knows to call for any questions that may arise between now and her next appointment.  We are happy to see her sooner if needed.   Total encounter time 20 minutes.Wilber Bihari, NP 08/22/20 9:46 AM Medical Oncology and Hematology [facility] [place], [place] [address] Tel. [phone]    Fax. [phone]  *Total Encounter Time as defined by the Centers for Medicare and Medicaid Services includes, in addition to the face-to-face time of a patient visit (documented in the note above) non-face-to-face time: obtaining and reviewing outside history, ordering and reviewing medications, tests or procedures, care coordination (communications with other health care professionals or caregivers) and documentation in the medical record.

## 2020-08-22 NOTE — Patient Instructions (Signed)

## 2020-10-02 NOTE — Progress Notes (Signed)
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  She completed adjuvant CMF chemotherapy on 08/22/2020.  She generally tolerated that well although she says she is now more short of breath than she was before.  She is due to resume fulvestrant today.  She had some doses previously and she says she tolerated them well.   REVIEW OF SYSTEMS: [name] had a pleasant small quiet holiday at home with her family.  She is bothered by shortness of breath.  She says doing anything for 2 or 3 minutes even just washing dishes will exhaust her and then she has to sit down to catch her breath.  She denies any cough any pleurisy or any phlegm production.  There has been no intercurrent fever.  She denies any pain or nausea.  She is not exercising regularly although she tries to walk in the house with a walker a few times a day for short distances.   COVID 19 VACCINATION STATUS: Moderna x2, most recently 11/2019, no booster as of 10/03/2020   BREAST Lopez HISTORY: From the original intake note:  [name] herself palpated a mass in her left breast, and immediately brought it to the attention of her primary care physician, Dr. Baird Lopez, who confirmed the finding and setup the patient for bilateral diagnostic mammography at the breast Center 02/20/2014. This showed a high  density mass in the outer left breast measuring up to 3.7 cm. It corresponded to the site of palpable concern. In addition the normal 4 logically abnormal lymph nodes in the left axilla. The mass was palpable by exam, and by ultrasonography measured 2.6 cm. There were enlarged and morphologically abnormal lymph nodes in the left axilla, largest measuring 3 cm.  Biopsy of the left breast mass in question and 1 of the abnormal axillary lymph nodes 02/22/2014 showed ([id number]) biopsies to be positive for an invasive ductal carcinoma, grade 3, triple negative, with an MIB-1 of 90%. Specifically the HER-2 signals ratio was 1.05, and the number per cell 2.00.  MRI of the breasts 03/01/2014 showed a 3.8 cm enhancing mass with areas of apparent necrosis in the left breast, as well as the biopsy tract extending laterally, the combined measure 5.3 cm. There were no other masses or areas of abnormal enhancement. There were multiple abnormally enlarged left axillary lymph nodes with loss of the normal fatty hilum. These included level I and retropectoral lymph nodes.  The patient's subsequent history is as detailed below   PAST MEDICAL HISTORY: Past Medical History:  Diagnosis Date  . Colon Lopez ([place])   . Hx of rotator cuff surgery   . Hypertension   . left breast Lopez   . Personal history of chemotherapy 2016   left  . Personal history of radiation therapy 2016   left breast   . Radiation 11/15/14-01/02/15   left breast, axillary and supraclavicular region 45 gray, lumpectomy cavity boosted to 16 gray    PAST SURGICAL  HISTORY: Past Surgical History:  Procedure Laterality Date  . ABDOMINAL HYSTERECTOMY  1988  . BREAST EXCISIONAL BIOPSY Right 12/21/2017  . BREAST LUMPECTOMY Left 2016  . COLON SURGERY  1988   colectomy/colostomy-ca  . COLON SURGERY  1988   colostomy takedown-reversal  . COLONOSCOPY    . EXCISION / BIOPSY BREAST / NIPPLE / DUCT Left 09/20/2014  . IR IMAGING GUIDED PORT  INSERTION  03/22/2020  . MASTECTOMY Left 02/14/2020  . MASTECTOMY W/ SENTINEL NODE BIOPSY Left 02/14/2020   Procedure: LEFT MASTECTOMY WITH BLUE DYE INJECTION;  Surgeon: Ruth Bookbinder, MD;  Location: [place];  Service: General;  Laterality: Left;  PEC BLOCK  . RADIOACTIVE SEED GUIDED EXCISIONAL BREAST BIOPSY Right 12/21/2017   Procedure: RIGHT RADIOACTIVE SEED GUIDED EXCISIONAL BREAST BIOPSY ERAS PATHWAY;  Surgeon: Ruth Bookbinder, MD;  Location: [place];  Service: General;  Laterality: Right;  . RADIOACTIVE SEED GUIDED PARTIAL MASTECTOMY WITH AXILLARY SENTINEL LYMPH NODE BIOPSY Left 09/20/2014   Procedure: RADIOACTIVE SEED GUIDED LEFT BREAST LUMPECTOMY WITH AXILLARY SENTINEL LYMPH NODE BIOPSY;  Surgeon: Ruth Bookbinder, MD;  Location: [place];  Service: General;  Laterality: Left;  . TOTAL SHOULDER ARTHROPLASTY  2009   right    FAMILY HISTORY Family History  Problem Relation Age of Onset  . Lopez Mother 72       breast  . Breast Lopez Mother        early 41s  . Stroke Father 53       cause of death  . Diabetes Father   . Lopez Maternal Aunt        breast Lopez at unknown age  . Breast Lopez Maternal Aunt   . Ovarian Lopez Neg Hx    the patient's father died at the age of 33 following a stroke in the setting of diabetes; the patient's mother is living at 54. The patient has 3 brothers and 2 sisters. [name], [name], was diagnosed with breast Lopez in her early 13s. There is no other breast or ovarian Lopez in the family. The patient herself was diagnosed with [name] by history he is an incidentally found stage I colon carcinoma noted at the time of her hysterectomy. She had a partial colectomy but no adjuvant treatment. We have no records regarding that procedure   GYNECOLOGIC HISTORY:  No LMP recorded. Patient has had a hysterectomy. Menarche age 48, first live birth age 14, the patient underwent total abdominal hysterectomy  with bilateral salpingo-oophorectomy in her 68s. She did not take hormone replacement. She did not take oral contraceptives at any point.   SOCIAL HISTORY: (Updated May 2021 [name] worked for [employer] for about 40 years, retiring in 2008. She is divorced and lives alone, with no pets.  Her grandson who is a Conservator, museum/gallery frequently stays with her.  Her daughter Anginette Espejo works for [employer] in Press photographer. The second daughter, Kanita Delage, works as a Scientist, water quality at [employer].  The patient has 6 grandchildren and three great-grandchildren. She attends a [employer]. (updated 09/28/2018)    ADVANCED DIRECTIVES: Not in place. The patient intends to name her daughter Marcello Fennel. her healthcare power of attorney. [name] is work number is [phone]. On the patient's 03/01/2014 visit she was given a copy of the appropriate documents to complete and notarize at her discretion.     HEALTH MAINTENANCE: Social History   Tobacco Use  . Smoking status: Never Smoker  . Smokeless tobacco: Never Used  Vaping Use  . Vaping Use: Never used  Substance Use Topics  . Alcohol use: Not Currently    Comment: occ-rare  . Drug use: No     Colonoscopy: 2000  PAP: May 2015  Bone density: 12/21/2017, T-score of -0.6  Lipid panel: 02/03/2014  No Known Allergies  Current Outpatient Medications  Medication Sig Dispense Refill  . acetaminophen (TYLENOL) 500 MG tablet Take 1,000 mg by mouth every 6 (six) hours as needed for mild pain or moderate pain.    Marland Kitchen dexamethasone (DECADRON) 4 MG tablet TAKE 1 TABLET DAILY X 2 DAYS AFTER CHEMO TREATMENT. 30 tablet 1  . gabapentin (NEURONTIN) 300 MG capsule Take 1 capsule (300 mg total) by mouth at bedtime. 90 capsule 4  . hydroxypropyl methylcellulose / hypromellose (ISOPTO TEARS / GONIOVISC) 2.5 % ophthalmic solution Place 1 drop into both eyes daily as needed for dry eyes.    Marland Kitchen lidocaine-prilocaine (EMLA) cream Apply to affected area once 30 g 3  .  Liniments (SALONPAS ARTHRITIS PAIN RELIEF EX) Apply 1 application topically daily as needed (knee pain).    Marland Kitchen lisinopril-hydrochlorothiazide (ZESTORETIC) 20-12.5 MG tablet Take 1/2 tablet daily by mouth 45 tablet 6  . LUMIGAN 0.01 % SOLN     . Multiple Vitamins-Minerals (AIRBORNE PO) Take 1 tablet by mouth 2 (two) times a week.    . prochlorperazine (COMPAZINE) 10 MG tablet Take 1 tablet (10 mg total) by mouth every 6 (six) hours as needed (Nausea or vomiting). 30 tablet 1  . ursodiol (ACTIGALL) 500 MG tablet Take 500 mg by mouth 3 (three) times daily.      No current facility-administered medications for this visit.    OBJECTIVE:  Vitals:   10/03/20 1400  BP: (!) 138/92  Pulse: (!) 107  Resp: 20  Temp: (!) 97 F (36.1 C)  SpO2: 100%    Filed Weights   10/03/20 1400  Weight: 243 lb 12.8 oz (110.6 kg)    Body mass index is 45.17 kg/m.     ECOG FS:1 - Symptomatic but completely ambulatory  Sclerae unicteric, EOMs intact Wearing a mask No cervical or supraclavicular adenopathy Lungs no rales or rhonchi Heart regular rate and rhythm Abd soft, obese, nontender, positive bowel sounds MSK no focal spinal tenderness, no upper extremity lymphedema Neuro: nonfocal, well oriented, appropriate affect Breasts: The right breast is unremarkable.  The left breast is status post mastectomy.  The scar is slightly irregular.  There is no evidence of local recurrence.  Both axillae are benign.   LAB RESULTS:  No results found for: SPEP  Lab Results  Component Value Date   WBC 6.0 10/03/2020   NEUTROABS 3.6 10/03/2020   HGB 10.8 (L) 10/03/2020   HCT 33.8 (L) 10/03/2020   MCV 96.3 10/03/2020   PLT 337 10/03/2020      Chemistry      Component Value Date/Time   NA 138 08/22/2020 0925   NA 143 12/07/2019 1742   NA 142 11/13/2016 1050   K 4.1 08/22/2020 0925   K 4.1 11/13/2016 1050   CL 104 08/22/2020 0925   CO2 23 08/22/2020 0925   CO2 27 11/13/2016 1050   BUN 25 (H)  08/22/2020 0925   BUN 13 12/07/2019 1742   BUN 11.5 11/13/2016 1050   CREATININE 1.27 (H) 08/22/2020 0925   CREATININE 0.86 01/30/2020 0946   CREATININE 1.0 11/13/2016 1050      Component Value Date/Time   CALCIUM 10.2 08/22/2020 0925   CALCIUM  9.8 11/13/2016 1050   ALKPHOS 113 08/22/2020 0925   ALKPHOS 140 11/13/2016 1050   AST 12 (L) 08/22/2020 0925   AST 12 (L) 01/30/2020 0946   AST 13 11/13/2016 1050   ALT 27 08/22/2020 0925   ALT 14 01/30/2020 0946   ALT 15 11/13/2016 1050   BILITOT 1.0 08/22/2020 0925   BILITOT 1.3 (H) 01/30/2020 0946   BILITOT 0.86 11/13/2016 1050       No results found for: LABCA2  No components found for: LABCA125  No results for input(s): INR in the last 168 hours.  Urinalysis    Component Value Date/Time   COLORURINE YELLOW 04/05/2008 1120   APPEARANCEUR CLEAR 04/05/2008 1120   LABSPEC 1.021 04/05/2008 1120   PHURINE 6.0 04/05/2008 1120   GLUCOSEU NEGATIVE 04/05/2008 1120   HGBUR NEGATIVE 04/05/2008 1120   BILIRUBINUR negative 12/07/2019 1212   KETONESUR NEGATIVE 04/05/2008 1120   PROTEINUR Negative 12/07/2019 1212   PROTEINUR NEGATIVE 04/05/2008 1120   UROBILINOGEN 1.0 12/07/2019 1212   UROBILINOGEN 0.2 04/05/2008 1120   NITRITE negative 12/07/2019 1212   NITRITE NEGATIVE 04/05/2008 1120   LEUKOCYTESUR Negative 12/07/2019 1212    STUDIES: No results found.    ASSESSMENT: 70 y.o. BRCA negative [name] woman s/p left breast upper outer quadrant and left axillary lymph node biopsy 02/22/2014, both positive for a clinical T2 N2, stage IIIA invasive ductal carcinoma, grade 3, triple negative, with an MIB-1 of 90%  (1) neoadjuvant chemotherapy started 03/13/2014, with cyclophosphamide and doxorubicin in dose dense fashion x4, with Neulasta support on day 2, completed 04/24/2014, followed by weekly carboplatin and paclitaxel x12, paclitaxel reduced during cycle 5 and cycle 7 because of elevated bilirubin levels  (2) left lumpectomy  and sentinel lymph node sampling 09/20/2014 showed a complete pathologic response.  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  (b) PALB2 mutations are known to increase the risk of breast and pancreatic Lopez, possibly other cancers, but the data is preliminary  (c) the patient's 2 daughters have been tested and did not carry the gene  (d) Annaleah's MSH2 VUS has been reclassified as "likely benign"  (e) she is status post remote TAH/BSO  (f) intensified screening, with mammography in February and breast MRI in August planned  (6) staging studies showed right-sided lung nodules, too small to characterize, and a dominant right-sided thyroid nodule unchanged in size as compared to 2011; to be followed  (a) chest CT 04/24/2016 showed the small pulmonary nodules to be completely stable, consistent with benign findings.  (b) thyroid ultrasound--done on 07/22/2018 showed enlarged heterogeneous lobular and multinodular thyroid gland, TI-RADS category 3 nodules, not meeting criteria for further biopsy or dedicated imaging f/u.    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  (8) anastrozole started 07/08/2018 for breast Lopez prevention, discontinued 01/30/2020 with disease recurrence  (a) bone density scan 01/01/2018 normal, T score -0.6  (9) RECURRENT DISEASE May 2021  (a) left breast overlapping biopsy 01/20/2020 shows a clinical  T2 N0, stage IIB invasive ductal carcinoma, grade 2 or 3, with moderate estrogen receptor positivity, progesterone receptor negative, HER-2 not  amplified, MIB-1 of 80%  (  b) left mastectomy on 02/14/2020 showed a pT2 pNX, invasive ductal carcinoma, grade 3, with negative margins   (c) CT of the chest and bone scan on 02/08/2020 shows no distant metastases  (10) started fulvestrant and palbociclib 02/24/2020 discontinued on 03/08/2020  (a) Oncotype sent on surgical sample shows score of 60 indicating a greater than 29% risk of recurrence on antiestrogens alone, and also predicting a significant benefit from chemotherapy.  (b) the Oncotype test found this tumor to be triple negative by single gene scores.   (11) cyclophosphamide, methotrexate, and fluorouracil [CMF] every 28 days x 8 cycles given from 03/27/2020-08/22/2020.  (12) fulvestrant resumed 10/03/2020   PLAN: [name] completed her CMF treatments without any major complications or difficulties.  She is now resuming antiestrogen therapy with fulvestrant.  I think the shortness of breath is most likely due to her morbid obesity.  She had 100% saturation at rest here and by exam her lungs are clear.  She has never had an echocardiogram and I think it would be useful to start with that.  I have entered that order.  Assuming the ejection fraction is adequate we can refer her to pulmonary.  We did discuss weight loss.  If she is abstains from potatoes and bread which are her particular downfall, she probably would manage to lose some weight but otherwise it is going to be very difficult for her  She is going to see me again in 8 weeks.  At that time we will consider restaging studies given her high risk of developing metastatic disease.  Total encounter time 25 minutes.Sarajane Jews C. Amiir Heckard, MD 10/03/20 2:11 PM Medical Oncology and Hematology [facility] [place], [place] [address] Tel. [phone]    Fax. [phone]   I, Wilburn Mylar, am acting as scribe for Dr. Virgie Lopez. Ananth Fiallos.  I, Lurline Del MD, have reviewed the above documentation  for accuracy and completeness, and I agree with the above.  *Total Encounter Time as defined by the Centers for Medicare and Medicaid Services includes, in addition to the face-to-face time of a patient visit (documented in the note above) non-face-to-face time: obtaining and reviewing outside history, ordering and reviewing medications, tests or procedures, care coordination (communications with other health care professionals or caregivers) and documentation in the medical record.

## 2020-10-03 ENCOUNTER — Inpatient Hospital Stay (HOSPITAL_BASED_OUTPATIENT_CLINIC_OR_DEPARTMENT_OTHER): Payer: PPO | Admitting: Oncology

## 2020-10-03 ENCOUNTER — Inpatient Hospital Stay: Payer: PPO

## 2020-10-03 ENCOUNTER — Inpatient Hospital Stay: Payer: PPO | Attending: Oncology

## 2020-10-03 ENCOUNTER — Other Ambulatory Visit: Payer: Self-pay

## 2020-10-03 VITALS — BP 138/92 | HR 107 | Temp 97.0°F | Resp 20 | Ht 61.6 in | Wt 243.8 lb

## 2020-10-03 DIAGNOSIS — C50919 Malignant neoplasm of unspecified site of unspecified female breast: Secondary | ICD-10-CM | POA: Diagnosis not present

## 2020-10-03 DIAGNOSIS — Z90722 Acquired absence of ovaries, bilateral: Secondary | ICD-10-CM | POA: Insufficient documentation

## 2020-10-03 DIAGNOSIS — Z85038 Personal history of other malignant neoplasm of large intestine: Secondary | ICD-10-CM | POA: Insufficient documentation

## 2020-10-03 DIAGNOSIS — Z1589 Genetic susceptibility to other disease: Secondary | ICD-10-CM | POA: Diagnosis not present

## 2020-10-03 DIAGNOSIS — Z1509 Genetic susceptibility to other malignant neoplasm: Secondary | ICD-10-CM

## 2020-10-03 DIAGNOSIS — Z90710 Acquired absence of both cervix and uterus: Secondary | ICD-10-CM | POA: Diagnosis not present

## 2020-10-03 DIAGNOSIS — C50412 Malignant neoplasm of upper-outer quadrant of left female breast: Secondary | ICD-10-CM

## 2020-10-03 DIAGNOSIS — Z923 Personal history of irradiation: Secondary | ICD-10-CM | POA: Diagnosis not present

## 2020-10-03 DIAGNOSIS — Z171 Estrogen receptor negative status [ER-]: Secondary | ICD-10-CM | POA: Diagnosis not present

## 2020-10-03 DIAGNOSIS — C773 Secondary and unspecified malignant neoplasm of axilla and upper limb lymph nodes: Secondary | ICD-10-CM | POA: Insufficient documentation

## 2020-10-03 DIAGNOSIS — Z809 Family history of malignant neoplasm, unspecified: Secondary | ICD-10-CM | POA: Insufficient documentation

## 2020-10-03 DIAGNOSIS — Z803 Family history of malignant neoplasm of breast: Secondary | ICD-10-CM | POA: Insufficient documentation

## 2020-10-03 DIAGNOSIS — Z1502 Genetic susceptibility to malignant neoplasm of ovary: Secondary | ICD-10-CM | POA: Diagnosis not present

## 2020-10-03 DIAGNOSIS — R0602 Shortness of breath: Secondary | ICD-10-CM | POA: Diagnosis not present

## 2020-10-03 DIAGNOSIS — Z79899 Other long term (current) drug therapy: Secondary | ICD-10-CM | POA: Insufficient documentation

## 2020-10-03 DIAGNOSIS — Z9221 Personal history of antineoplastic chemotherapy: Secondary | ICD-10-CM | POA: Diagnosis not present

## 2020-10-03 DIAGNOSIS — Z9079 Acquired absence of other genital organ(s): Secondary | ICD-10-CM | POA: Diagnosis not present

## 2020-10-03 DIAGNOSIS — Z9049 Acquired absence of other specified parts of digestive tract: Secondary | ICD-10-CM | POA: Insufficient documentation

## 2020-10-03 DIAGNOSIS — C50912 Malignant neoplasm of unspecified site of left female breast: Secondary | ICD-10-CM | POA: Diagnosis not present

## 2020-10-03 DIAGNOSIS — Z9012 Acquired absence of left breast and nipple: Secondary | ICD-10-CM | POA: Insufficient documentation

## 2020-10-03 DIAGNOSIS — Z95828 Presence of other vascular implants and grafts: Secondary | ICD-10-CM

## 2020-10-03 LAB — COMPREHENSIVE METABOLIC PANEL
ALT: 10 U/L (ref 0–44)
AST: 14 U/L — ABNORMAL LOW (ref 15–41)
Albumin: 3.2 g/dL — ABNORMAL LOW (ref 3.5–5.0)
Alkaline Phosphatase: 89 U/L (ref 38–126)
Anion gap: 12 (ref 5–15)
BUN: 8 mg/dL (ref 8–23)
CO2: 22 mmol/L (ref 22–32)
Calcium: 9.8 mg/dL (ref 8.9–10.3)
Chloride: 109 mmol/L (ref 98–111)
Creatinine, Ser: 0.86 mg/dL (ref 0.44–1.00)
GFR, Estimated: >60 mL/min (ref >60)
Glucose, Bld: 101 mg/dL — ABNORMAL HIGH (ref 70–99)
Potassium: 3.4 mmol/L — ABNORMAL LOW (ref 3.5–5.1)
Sodium: 143 mmol/L (ref 135–145)
Total Bilirubin: 1.1 mg/dL (ref 0.3–1.2)
Total Protein: 6.8 g/dL (ref 6.5–8.1)

## 2020-10-03 LAB — CBC WITH DIFFERENTIAL/PLATELET
Abs Immature Granulocytes: 0.01 10*3/uL (ref 0.00–0.07)
Basophils Absolute: 0 10*3/uL (ref 0.0–0.1)
Basophils Relative: 1 %
Eosinophils Absolute: 0.2 10*3/uL (ref 0.0–0.5)
Eosinophils Relative: 3 %
HCT: 33.8 % — ABNORMAL LOW (ref 36.0–46.0)
Hemoglobin: 10.8 g/dL — ABNORMAL LOW (ref 12.0–15.0)
Immature Granulocytes: 0 %
Lymphocytes Relative: 27 %
Lymphs Abs: 1.6 10*3/uL (ref 0.7–4.0)
MCH: 30.8 pg (ref 26.0–34.0)
MCHC: 32 g/dL (ref 30.0–36.0)
MCV: 96.3 fL (ref 80.0–100.0)
Monocytes Absolute: 0.5 10*3/uL (ref 0.1–1.0)
Monocytes Relative: 8 %
Neutro Abs: 3.6 10*3/uL (ref 1.7–7.7)
Neutrophils Relative %: 61 %
Platelets: 337 10*3/uL (ref 150–400)
RBC: 3.51 MIL/uL — ABNORMAL LOW (ref 3.87–5.11)
RDW: 13.6 % (ref 11.5–15.5)
WBC: 6 10*3/uL (ref 4.0–10.5)
nRBC: 0 % (ref 0.0–0.2)

## 2020-10-03 MED ORDER — FULVESTRANT 250 MG/5ML IM SOLN: 500.0000 mg | INTRAMUSCULAR | Status: DC

## 2020-10-03 MED ORDER — SODIUM CHLORIDE 0.9% FLUSH
10.0000 mL | Freq: Once | INTRAVENOUS | Status: AC
  Administered 2020-10-03: 10 mL
  Filled 2020-10-03: qty 10

## 2020-10-03 MED ORDER — FULVESTRANT 250 MG/5ML IM SOLN
INTRAMUSCULAR | Status: AC
  Filled 2020-10-03: qty 5

## 2020-10-03 MED ORDER — HEPARIN SOD (PORK) LOCK FLUSH 100 UNIT/ML IV SOLN
500.0000 [IU] | Freq: Once | INTRAVENOUS | Status: AC
  Administered 2020-10-03: 500 [IU]
  Filled 2020-10-03: qty 5

## 2020-10-05 ENCOUNTER — Telehealth: Payer: Self-pay | Admitting: Oncology

## 2020-10-05 DIAGNOSIS — C50912 Malignant neoplasm of unspecified site of left female breast: Secondary | ICD-10-CM | POA: Diagnosis not present

## 2020-10-05 NOTE — Telephone Encounter (Signed)
Scheduled appts per 1/19 los. Pt confirmed appt dates and times.  

## 2020-10-26 ENCOUNTER — Other Ambulatory Visit: Payer: Self-pay | Admitting: Internal Medicine

## 2020-10-26 ENCOUNTER — Other Ambulatory Visit: Payer: Self-pay | Admitting: Oncology

## 2020-10-26 DIAGNOSIS — Z171 Estrogen receptor negative status [ER-]: Secondary | ICD-10-CM

## 2020-10-26 DIAGNOSIS — C189 Malignant neoplasm of colon, unspecified: Secondary | ICD-10-CM

## 2020-10-26 DIAGNOSIS — C50412 Malignant neoplasm of upper-outer quadrant of left female breast: Secondary | ICD-10-CM

## 2020-10-26 DIAGNOSIS — Z1231 Encounter for screening mammogram for malignant neoplasm of breast: Secondary | ICD-10-CM

## 2020-10-29 IMAGING — MR MR BREAST BILAT WO/W CM
10 of 12 series · 37 of 48 positions shown · IV contrast (10 ml gadavist)
Comparison: Previous exam(s).

CLINICAL DATA: 68-year-old female presenting for high risk
screening MRI. Patient has history of treated left breast cancer
status post lumpectomy in 9545. MRI guided biopsy of linear
enhancement in the left breast in 2525 revealed sclerotic
fibroadenomatoid nodule. Additionally, the patient had an
intraductal papilloma excised from the right breast in 6842.

LABS:  None performed on site.
EXAM:
BILATERAL BREAST MRI WITH AND WITHOUT CONTRAST
TECHNIQUE: Multiplanar, multisequence MR images of both breasts were obtained
prior to and following the intravenous administration of 10 ml of
Gadavist.

[Series 2: t2_tirm_tra ipat (a-p) · axial · 3.0mm · 0.70mm/px · 1 of 59 slices shown]
[im 1/59]
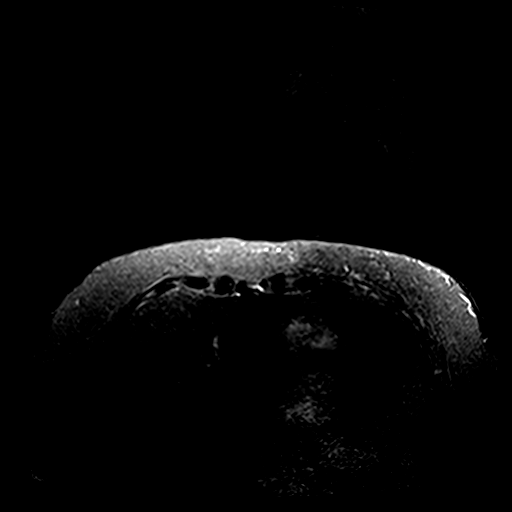

[Series 3: fl3d pre-cm no · axial · non-contrast · 1.2mm · 1.02mm/px · z∈[-53,+138]mm · 5 of 160 slices shown]
[im 1/160]
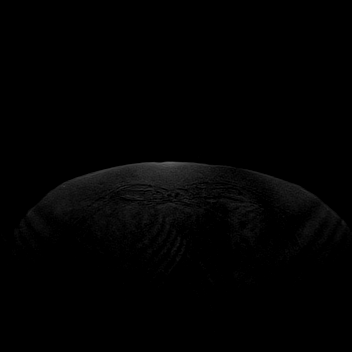
[im 40/160]
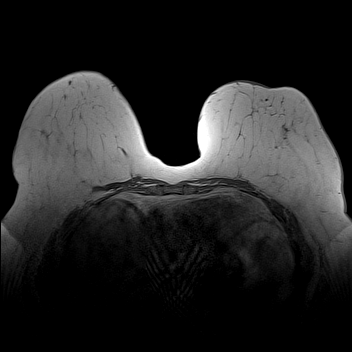
[im 80/160]
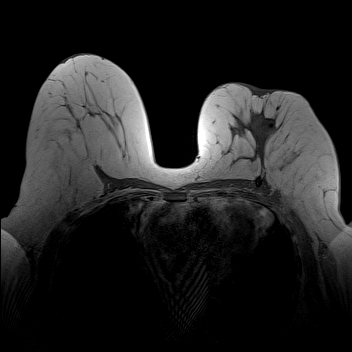
[im 120/160]
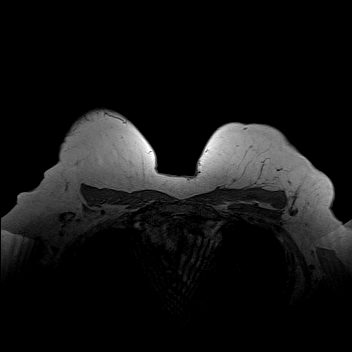
[im 160/160]
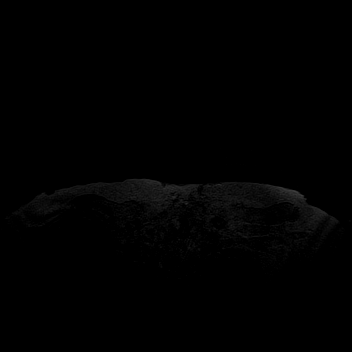

[Series 4: fl3d pre-cm · axial · non-contrast · 1.2mm · 1.02mm/px · z∈[-53,+138]mm · 5 of 160 slices shown]
[im 1/160]
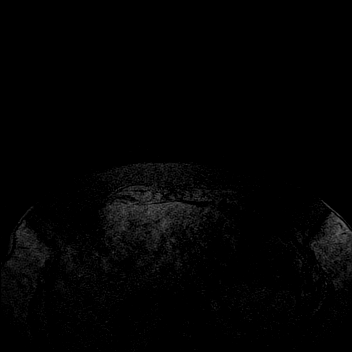
[im 40/160]
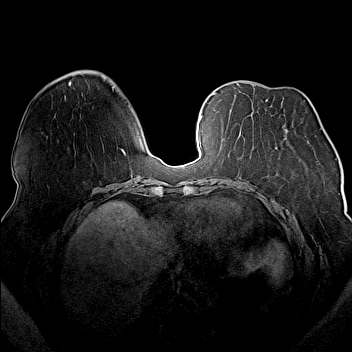
[im 80/160]
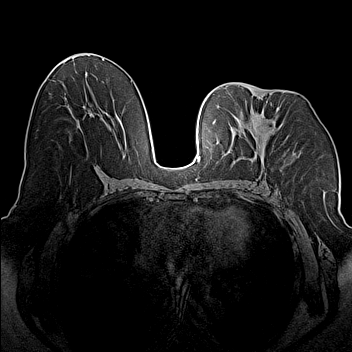
[im 120/160]
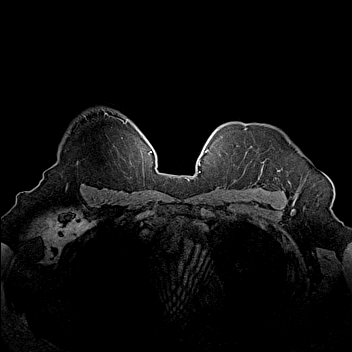
[im 160/160]
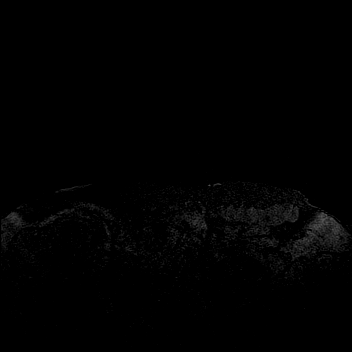

[Series 5: fl3d post-cm 20 · axial · 1.2mm · 1.02mm/px · z∈[-53,+138]mm · 5 of 160 slices shown (1 of 3)]
[im 1/160]
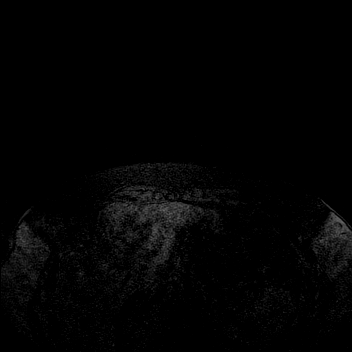
[im 40/160]
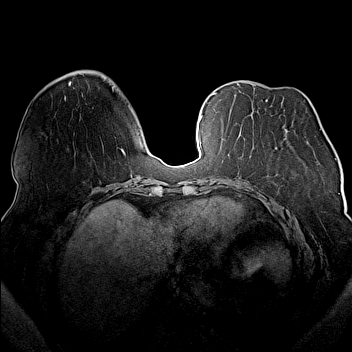
[im 80/160]
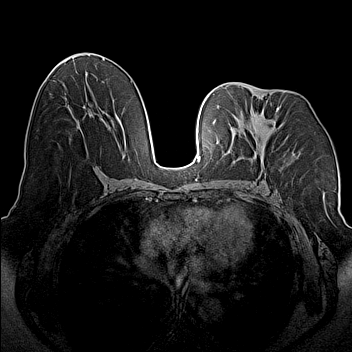
[im 120/160]
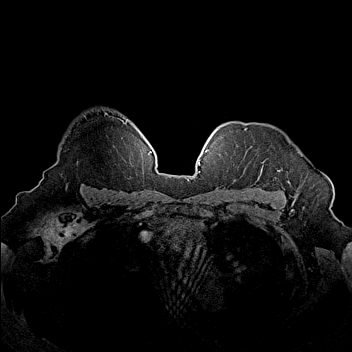
[im 160/160]
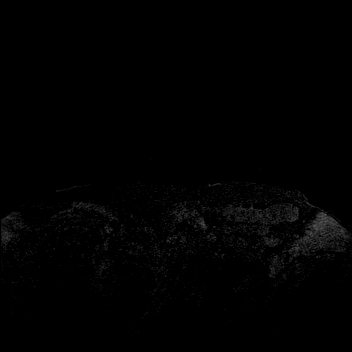

[Series 6: fl3d post-cm 20 · axial · 1.2mm · 1.02mm/px · z∈[-53,+138]mm · 5 of 160 slices shown (2 of 3)]
[im 1/160]
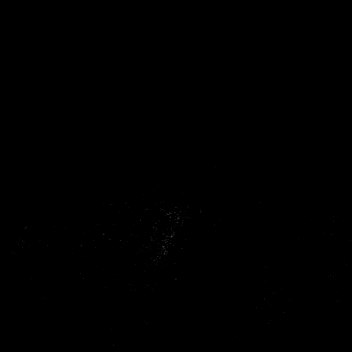
[im 40/160]
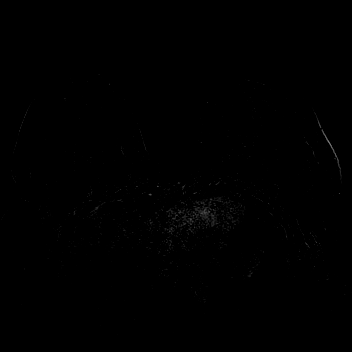
[im 80/160]
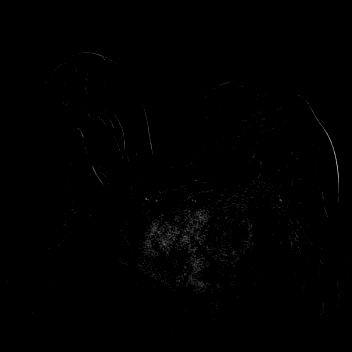
[im 120/160]
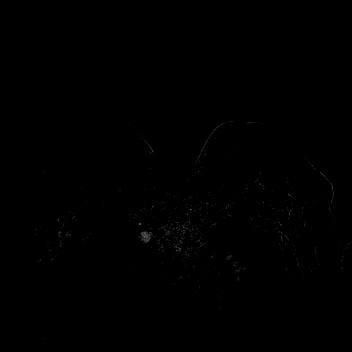
[im 160/160]
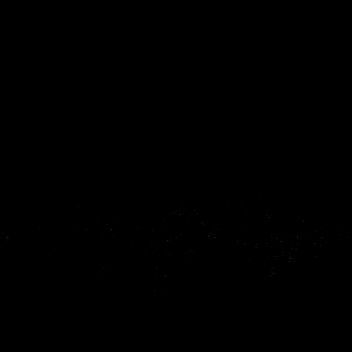

[Series 7: fl3d post-cm 20 · axial · 192.0mm · 1.02mm/px · 1 of 1 slices shown (3 of 3)]
[im 1/1]
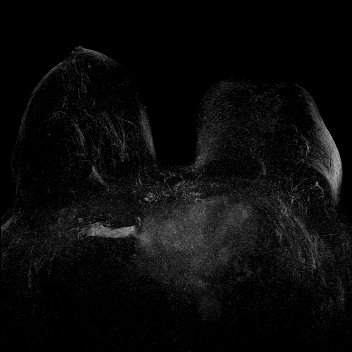

[Series 8: fl3d post-cm 3min · axial · 1.2mm · 1.02mm/px · z∈[-53,+138]mm · 6 of 160 slices shown]
[im 1/160]
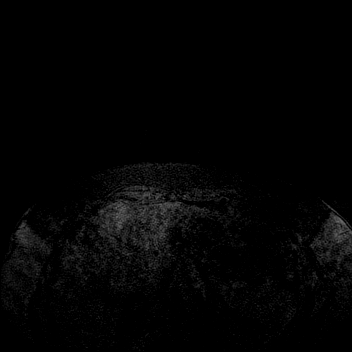
[im 32/160]
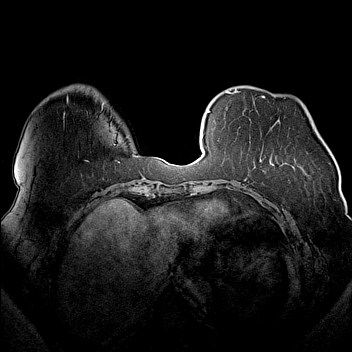
[im 64/160]
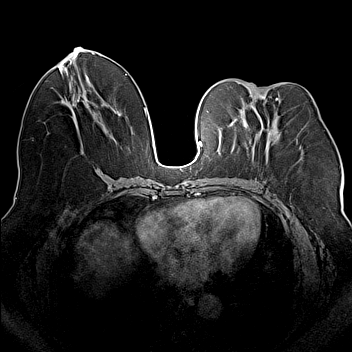
[im 96/160]
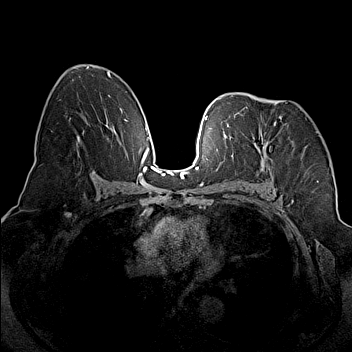
[im 128/160]
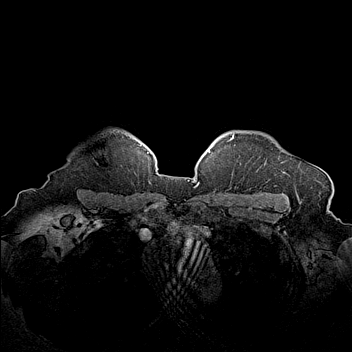
[im 160/160]
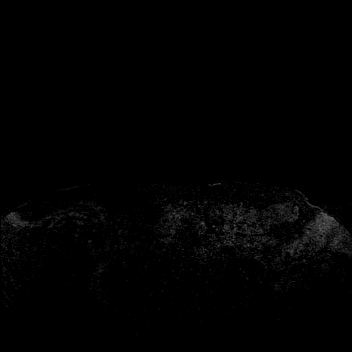

[Series 9: fl3d post-cm 3min_sub · axial · 1.2mm · 1.02mm/px · z∈[-53,+138]mm · 6 of 160 slices shown]
[im 1/160]
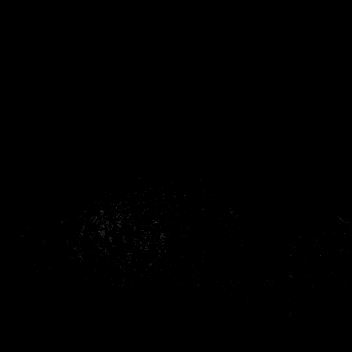
[im 32/160]
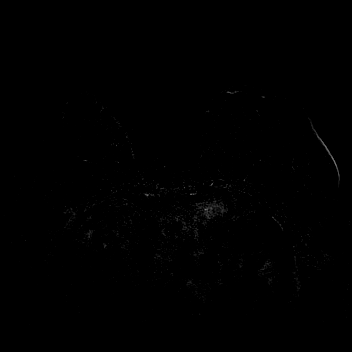
[im 64/160]
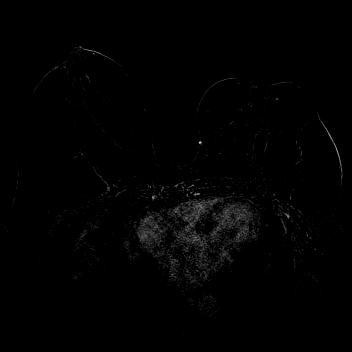
[im 96/160]
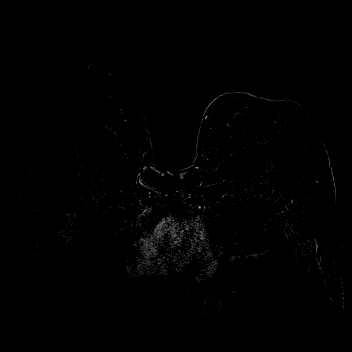
[im 128/160]
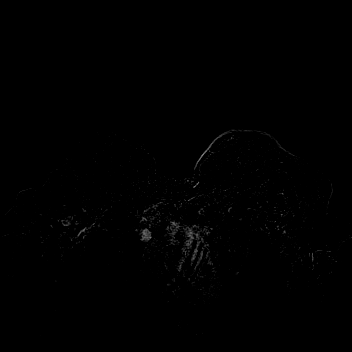
[im 160/160]
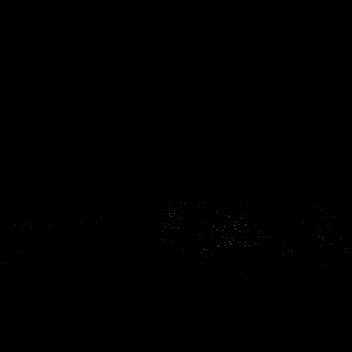

[Series 10: fl3d post-cm 3min_sub_mip_tra · axial · 192.0mm · 1.02mm/px · 1 of 1 slices shown]
[im 1/1]
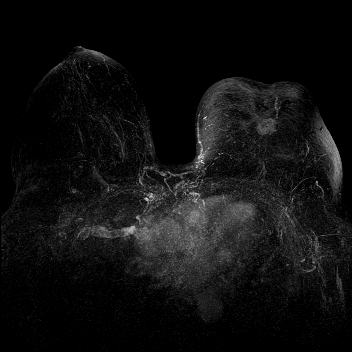

[Series 11: fl3d post-cm 5min · axial · 1.2mm · 1.02mm/px · z∈[-53,-16]mm · 2 of 160 slices shown]
[im 1/160]
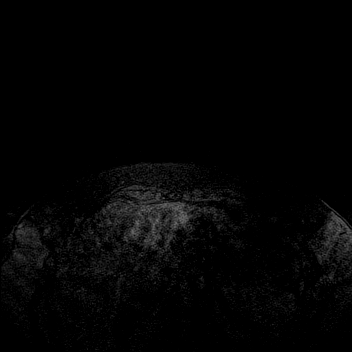
[im 32/160]
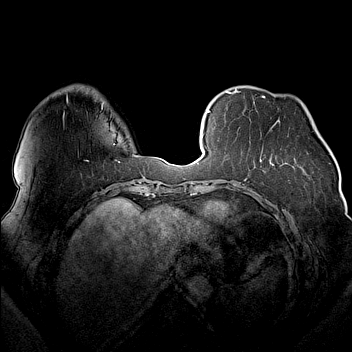

[37 of 48 positions shown; findings below may reference images not displayed]

Three-dimensional MR images were rendered by post-processing of the
original MR data on an independent workstation. The
three-dimensional MR images were interpreted, and findings are
reported in the following complete MRI report for this study. Three
dimensional images were evaluated at the independent [employer]
workstation
FINDINGS: Breast composition: b. Scattered fibroglandular tissue.

Background parenchymal enhancement: Mild.

Right breast: No suspicious mass or abnormal enhancement.

Left breast: Post lumpectomy changes are noted in the central left
breast. Susceptibility artifact from post biopsy clip is noted in
the subareolar aspect at middle depth. This is seen in association
with a 2.2 x 1.9 x 1.9 cm circumscribed mass (series 9, image
80/160). It demonstrates mild T2 hyperintensity with delayed,
heterogeneous enhancement. No other mass or abnormal enhancement in
the remainder of the left breast.

Lymph nodes: No abnormal appearing lymph nodes.

Ancillary findings:  None.
IMPRESSION: 1. Indeterminate left breast mass (series 9, image 80).
2. No MRI evidence of malignancy on the right.
3. No suspicious lymphadenopathy.

RECOMMENDATION:
Recommendation is that the patient return for focal diagnostic
evaluation of the left breast to include spot mammographic views and
ultrasound for further evaluation of of a 2.2 cm enhancing mass. If
no other imaging correlate can be identified, MRI guided biopsy is
recommended.

BI-RADS CATEGORY  4: Suspicious.

## 2020-10-30 ENCOUNTER — Ambulatory Visit (HOSPITAL_COMMUNITY): Payer: PPO | Attending: Cardiovascular Disease

## 2020-10-30 ENCOUNTER — Other Ambulatory Visit: Payer: Self-pay

## 2020-10-30 DIAGNOSIS — I110 Hypertensive heart disease with heart failure: Secondary | ICD-10-CM | POA: Insufficient documentation

## 2020-10-30 DIAGNOSIS — I509 Heart failure, unspecified: Secondary | ICD-10-CM | POA: Insufficient documentation

## 2020-10-30 DIAGNOSIS — C50919 Malignant neoplasm of unspecified site of unspecified female breast: Secondary | ICD-10-CM | POA: Diagnosis not present

## 2020-10-30 DIAGNOSIS — C50912 Malignant neoplasm of unspecified site of left female breast: Secondary | ICD-10-CM | POA: Insufficient documentation

## 2020-10-30 DIAGNOSIS — Z171 Estrogen receptor negative status [ER-]: Secondary | ICD-10-CM | POA: Diagnosis not present

## 2020-10-30 DIAGNOSIS — C50412 Malignant neoplasm of upper-outer quadrant of left female breast: Secondary | ICD-10-CM

## 2020-10-30 DIAGNOSIS — Z1502 Genetic susceptibility to malignant neoplasm of ovary: Secondary | ICD-10-CM

## 2020-10-30 DIAGNOSIS — Z1589 Genetic susceptibility to other disease: Secondary | ICD-10-CM | POA: Diagnosis not present

## 2020-10-30 DIAGNOSIS — Z1509 Genetic susceptibility to other malignant neoplasm: Secondary | ICD-10-CM | POA: Diagnosis not present

## 2020-10-30 LAB — ECHOCARDIOGRAM COMPLETE
Area-P 1/2: 4.8 cm2
S' Lateral: 2.3 cm

## 2020-10-31 ENCOUNTER — Inpatient Hospital Stay: Payer: PPO

## 2020-10-31 ENCOUNTER — Inpatient Hospital Stay: Payer: PPO | Attending: Oncology

## 2020-10-31 VITALS — BP 132/90 | HR 110 | Temp 98.9°F | Resp 20

## 2020-10-31 DIAGNOSIS — C773 Secondary and unspecified malignant neoplasm of axilla and upper limb lymph nodes: Secondary | ICD-10-CM | POA: Diagnosis not present

## 2020-10-31 DIAGNOSIS — Z95828 Presence of other vascular implants and grafts: Secondary | ICD-10-CM

## 2020-10-31 DIAGNOSIS — C50412 Malignant neoplasm of upper-outer quadrant of left female breast: Secondary | ICD-10-CM | POA: Diagnosis not present

## 2020-10-31 DIAGNOSIS — Z79899 Other long term (current) drug therapy: Secondary | ICD-10-CM | POA: Insufficient documentation

## 2020-10-31 DIAGNOSIS — Z171 Estrogen receptor negative status [ER-]: Secondary | ICD-10-CM

## 2020-10-31 DIAGNOSIS — C50919 Malignant neoplasm of unspecified site of unspecified female breast: Secondary | ICD-10-CM

## 2020-10-31 DIAGNOSIS — C50912 Malignant neoplasm of unspecified site of left female breast: Secondary | ICD-10-CM

## 2020-10-31 DIAGNOSIS — Z5111 Encounter for antineoplastic chemotherapy: Secondary | ICD-10-CM | POA: Insufficient documentation

## 2020-10-31 DIAGNOSIS — Z1589 Genetic susceptibility to other disease: Secondary | ICD-10-CM

## 2020-10-31 LAB — CBC WITH DIFFERENTIAL/PLATELET
Abs Immature Granulocytes: 0.03 10*3/uL (ref 0.00–0.07)
Basophils Absolute: 0 10*3/uL (ref 0.0–0.1)
Basophils Relative: 0 %
Eosinophils Absolute: 0 10*3/uL (ref 0.0–0.5)
Eosinophils Relative: 0 %
HCT: 34.1 % — ABNORMAL LOW (ref 36.0–46.0)
Hemoglobin: 11.1 g/dL — ABNORMAL LOW (ref 12.0–15.0)
Immature Granulocytes: 0 %
Lymphocytes Relative: 11 %
Lymphs Abs: 1 10*3/uL (ref 0.7–4.0)
MCH: 30.2 pg (ref 26.0–34.0)
MCHC: 32.6 g/dL (ref 30.0–36.0)
MCV: 92.9 fL (ref 80.0–100.0)
Monocytes Absolute: 0.5 10*3/uL (ref 0.1–1.0)
Monocytes Relative: 5 %
Neutro Abs: 7.5 10*3/uL (ref 1.7–7.7)
Neutrophils Relative %: 84 %
Platelets: 306 10*3/uL (ref 150–400)
RBC: 3.67 MIL/uL — ABNORMAL LOW (ref 3.87–5.11)
RDW: 12.3 % (ref 11.5–15.5)
WBC: 9 10*3/uL (ref 4.0–10.5)
nRBC: 0 % (ref 0.0–0.2)

## 2020-10-31 LAB — COMPREHENSIVE METABOLIC PANEL
ALT: 18 U/L (ref 0–44)
AST: 10 U/L — ABNORMAL LOW (ref 15–41)
Albumin: 3.3 g/dL — ABNORMAL LOW (ref 3.5–5.0)
Alkaline Phosphatase: 117 U/L (ref 38–126)
Anion gap: 10 (ref 5–15)
BUN: 13 mg/dL (ref 8–23)
CO2: 24 mmol/L (ref 22–32)
Calcium: 10.1 mg/dL (ref 8.9–10.3)
Chloride: 106 mmol/L (ref 98–111)
Creatinine, Ser: 1.34 mg/dL — ABNORMAL HIGH (ref 0.44–1.00)
GFR, Estimated: 43 mL/min — ABNORMAL LOW (ref >60)
Glucose, Bld: 147 mg/dL — ABNORMAL HIGH (ref 70–99)
Potassium: 3.8 mmol/L (ref 3.5–5.1)
Sodium: 140 mmol/L (ref 135–145)
Total Bilirubin: 0.9 mg/dL (ref 0.3–1.2)
Total Protein: 7.1 g/dL (ref 6.5–8.1)

## 2020-10-31 MED ORDER — HEPARIN SOD (PORK) LOCK FLUSH 100 UNIT/ML IV SOLN
500.0000 [IU] | Freq: Once | INTRAVENOUS | Status: AC
  Administered 2020-10-31: 500 [IU]
  Filled 2020-10-31: qty 5

## 2020-10-31 MED ORDER — SODIUM CHLORIDE 0.9% FLUSH
10.0000 mL | Freq: Once | INTRAVENOUS | Status: AC
  Administered 2020-10-31: 10 mL
  Filled 2020-10-31: qty 10

## 2020-10-31 MED ORDER — FULVESTRANT 250 MG/5ML IM SOLN
500.0000 mg | INTRAMUSCULAR | Status: DC
  Administered 2020-10-31: 500 mg via INTRAMUSCULAR

## 2020-10-31 MED ORDER — FULVESTRANT 250 MG/5ML IM SOLN
INTRAMUSCULAR | Status: AC
  Filled 2020-10-31: qty 10

## 2020-10-31 NOTE — Patient Instructions (Signed)

## 2020-10-31 NOTE — Patient Instructions (Signed)
Fulvestrant injection What is this medicine? FULVESTRANT (ful VES trant) blocks the effects of estrogen. It is used to treat breast cancer. This medicine may be used for other purposes; ask your health care provider or pharmacist if you have questions. COMMON BRAND NAME(S): FASLODEX What should I tell my health care provider before I take this medicine? They need to know if you have any of these conditions:  bleeding disorders  liver disease  low blood counts, like low white cell, platelet, or red cell counts  an unusual or allergic reaction to fulvestrant, other medicines, foods, dyes, or preservatives  pregnant or trying to get pregnant  breast-feeding How should I use this medicine? This medicine is for injection into a muscle. It is usually given by a health care professional in a hospital or clinic setting. Talk to your pediatrician regarding the use of this medicine in children. Special care may be needed. Overdosage: If you think you have taken too much of this medicine contact a poison control center or emergency room at once. NOTE: This medicine is only for you. Do not share this medicine with others. What if I miss a dose? It is important not to miss your dose. Call your doctor or health care professional if you are unable to keep an appointment. What may interact with this medicine?  medicines that treat or prevent blood clots like warfarin, enoxaparin, dalteparin, apixaban, dabigatran, and rivaroxaban This list may not describe all possible interactions. Give your health care provider a list of all the medicines, herbs, non-prescription drugs, or dietary supplements you use. Also tell them if you smoke, drink alcohol, or use illegal drugs. Some items may interact with your medicine. What should I watch for while using this medicine? Your condition will be monitored carefully while you are receiving this medicine. You will need important blood work done while you are taking  this medicine. Do not become pregnant while taking this medicine or for at least 1 year after stopping it. Women of child-bearing potential will need to have a negative pregnancy test before starting this medicine. Women should inform their doctor if they wish to become pregnant or think they might be pregnant. There is a potential for serious side effects to an unborn child. Men should inform their doctors if they wish to father a child. This medicine may lower sperm counts. Talk to your health care professional or pharmacist for more information. Do not breast-feed an infant while taking this medicine or for 1 year after the last dose. What side effects may I notice from receiving this medicine? Side effects that you should report to your doctor or health care professional as soon as possible:  allergic reactions like skin rash, itching or hives, swelling of the face, lips, or tongue  feeling faint or lightheaded, falls  pain, tingling, numbness, or weakness in the legs  signs and symptoms of infection like fever or chills; cough; flu-like symptoms; sore throat  vaginal bleeding Side effects that usually do not require medical attention (report to your doctor or health care professional if they continue or are bothersome):  aches, pains  constipation  diarrhea  headache  hot flashes  nausea, vomiting  pain at site where injected  stomach pain This list may not describe all possible side effects. Call your doctor for medical advice about side effects. You may report side effects to FDA at 1-800-FDA-1088. Where should I keep my medicine? This drug is given in a hospital or clinic and will   not be stored at home. NOTE: This sheet is a summary. It may not cover all possible information. If you have questions about this medicine, talk to your doctor, pharmacist, or health care provider.  2021 Elsevier/Gold Standard (2017-12-10 11:34:41)  

## 2020-11-01 LAB — CANCER ANTIGEN 27.29: CA 27.29: 33.8 U/mL (ref 0.0–38.6)

## 2020-11-13 ENCOUNTER — Ambulatory Visit: Payer: PPO | Attending: Critical Care Medicine

## 2020-11-13 DIAGNOSIS — Z23 Encounter for immunization: Secondary | ICD-10-CM

## 2020-11-13 NOTE — Progress Notes (Signed)
   Covid-19 Vaccination Clinic  Name:  [name]    MRN: [id number] DOB: 1950-12-10  11/13/2020  [name] was observed post Covid-19 immunization for 15 MIN without incident. She was provided with Vaccine Information Sheet and instruction to access the V-Safe system.   [name] was instructed to call 911 with any severe reactions post vaccine: Marland Kitchen Difficulty breathing  . Swelling of face and throat  . A fast heartbeat  . A bad rash all over body  . Dizziness and weakness   Immunizations Administered    Name Date Dose VIS Date Route   Moderna Covid-19 Booster Vaccine 11/13/2020 12:58 PM 0.25 mL 07/04/2020 Intramuscular   Manufacturer: Moderna   Lot: [id number]   [place]: [id number]

## 2020-11-19 MED FILL — LISINOPRIL-HCTZ 20-12.5 MG: 20-12.5 | 90 days supply | Qty: 45 | Fill #1

## 2020-11-27 NOTE — Progress Notes (Signed)
[place]  [phone] [phone]     ID: [name] DOB: 07/08/1951  MR#: [id number]  XBL#:[id number]  Patient Care Team: Minette Brine, FNP as PCP - General (General Practice) Magrinat, Virgie Dad, MD as Consulting Physician (Oncology) Rolm Bookbinder, MD as Consulting Physician (General Surgery) Gery Pray, MD as Consulting Physician (Radiation Oncology) [name], MD as Consulting Physician (Ophthalmology)   CHIEF COMPLAINT: triple negative breast cancer; PALB2 positive (s/p left mastectomy)  CURRENT TREATMENT: fulvestrant   INTERVAL HISTORY: [name] returns today for follow up and treatment of her now-recurrent left breast cancer.  She continues on fulvestrant.  She is scheduled for screening mammogram on 12/18/2020 and for bone density screening on 03/28/2021.  Because of her ongoing shortness of breath we obtain an echocardiogram on 10/30/2020 which showed an ejection fraction in the 60-65% range.  This was essentially normal.  Recall she also had a chest x-ray on 05/30/2020 which was unremarkable   REVIEW OF SYSTEMS: [name] tells me she is not exercising as much as she would like but she actually does get up and walk around the house for about 10 minutes about 3 or even 4 times a day.  That will not up to about 40 minutes of walking which is really quite excellent in her situation.  She continues to be quite short of breath which I think correctly she attributes to her weight.  We did check an echo and a chest x-ray which were essentially normal.  She has occasional shooting pains in the left mastectomy area.  This does not worry her as she knows this is common.  Aside from that a detailed review of systems today was stable   COVID 19 VACCINATION STATUS: fully vaccinated [name]), with booster 11/2020   BREAST CANCER HISTORY: From the original intake note:  [name] herself palpated a mass in her left breast, and immediately  brought it to the attention of her primary care physician, Dr. Baird Cancer, who confirmed the finding and setup the patient for bilateral diagnostic mammography at the breast Center 02/20/2014. This showed a high density mass in the outer left breast measuring up to 3.7 cm. It corresponded to the site of palpable concern. In addition the normal 4 logically abnormal lymph nodes in the left axilla. The mass was palpable by exam, and by ultrasonography measured 2.6 cm. There were enlarged and morphologically abnormal lymph nodes in the left axilla, largest measuring 3 cm.  Biopsy of the left breast mass in question and 1 of the abnormal axillary lymph nodes 02/22/2014 showed ([id number]) biopsies to be positive for an invasive ductal carcinoma, grade 3, triple negative, with an MIB-1 of 90%. Specifically the HER-2 signals ratio was 1.05, and the number per cell 2.00.  MRI of the breasts 03/01/2014 showed a 3.8 cm enhancing mass with areas of apparent necrosis in the left breast, as well as the biopsy tract extending laterally, the combined measure 5.3 cm. There were no other masses or areas of abnormal enhancement. There were multiple abnormally enlarged left axillary lymph nodes with loss of the normal fatty hilum. These included level I and retropectoral lymph nodes.  The patient's subsequent history is as detailed below   PAST MEDICAL HISTORY: Past Medical History:  Diagnosis Date  . Colon cancer ([place])   . Hx of rotator cuff surgery   . Hypertension   . left breast cancer   . Personal history of chemotherapy 2016   left  . Personal history of  radiation therapy 2016   left breast   . Radiation 11/15/14-01/02/15   left breast, axillary and supraclavicular region 45 gray, lumpectomy cavity boosted to 16 gray    PAST SURGICAL HISTORY: Past Surgical History:  Procedure Laterality Date  . ABDOMINAL HYSTERECTOMY  1988  . BREAST EXCISIONAL BIOPSY Right 12/21/2017  . BREAST LUMPECTOMY Left 2016  .  COLON SURGERY  1988   colectomy/colostomy-ca  . COLON SURGERY  1988   colostomy takedown-reversal  . COLONOSCOPY    . EXCISION / BIOPSY BREAST / NIPPLE / DUCT Left 09/20/2014  . IR IMAGING GUIDED PORT INSERTION  03/22/2020  . MASTECTOMY Left 02/14/2020  . MASTECTOMY W/ SENTINEL NODE BIOPSY Left 02/14/2020   Procedure: LEFT MASTECTOMY WITH BLUE DYE INJECTION;  Surgeon: Rolm Bookbinder, MD;  Location: [facility];  Service: General;  Laterality: Left;  PEC BLOCK  . RADIOACTIVE SEED GUIDED EXCISIONAL BREAST BIOPSY Right 12/21/2017   Procedure: RIGHT RADIOACTIVE SEED GUIDED EXCISIONAL BREAST BIOPSY ERAS PATHWAY;  Surgeon: Rolm Bookbinder, MD;  Location: [place] [facility];  Service: General;  Laterality: Right;  . RADIOACTIVE SEED GUIDED PARTIAL MASTECTOMY WITH AXILLARY SENTINEL LYMPH NODE BIOPSY Left 09/20/2014   Procedure: RADIOACTIVE SEED GUIDED LEFT BREAST LUMPECTOMY WITH AXILLARY SENTINEL LYMPH NODE BIOPSY;  Surgeon: Rolm Bookbinder, MD;  Location: [place] [facility];  Service: General;  Laterality: Left;  . TOTAL SHOULDER ARTHROPLASTY  2009   right    FAMILY HISTORY Family History  Problem Relation Age of Onset  . Cancer Mother 29       breast  . Breast cancer Mother        early 41s  . Stroke Father 8       cause of death  . Diabetes Father   . Cancer Maternal Aunt        breast cancer at unknown age  . Breast cancer Maternal Aunt   . Ovarian cancer Neg Hx    the patient's father died at the age of 76 following a stroke in the setting of diabetes; the patient's mother is living at 71. The patient has 3 brothers and 2 sisters. [name], [name], was diagnosed with breast cancer in her early 12s. There is no other breast or ovarian cancer in the family. The patient herself was diagnosed with watts by history he is an incidentally found stage I colon carcinoma noted at the time of her hysterectomy. She had a partial colectomy but no adjuvant treatment. We have  no records regarding that procedure   GYNECOLOGIC HISTORY:  No LMP recorded. Patient has had a hysterectomy. Menarche age 23, first live birth age 72, the patient underwent total abdominal hysterectomy with bilateral salpingo-oophorectomy in her 81s. She did not take hormone replacement. She did not take oral contraceptives at any point.   SOCIAL HISTORY: (Updated May 2021 Dijon worked for [employer] for about 40 years, retiring in 2008. She is divorced and lives alone, with no pets.  Her grandson who is a Conservator, museum/gallery frequently stays with her.  Her daughter Ahlivia Salahuddin works for [employer] in Press photographer. The second daughter, Arrietty Dercole, works as a Scientist, water quality at [employer].  The patient has 6 grandchildren and three great-grandchildren. She attends a [employer]. (updated 09/28/2018)    ADVANCED DIRECTIVES: Not in place. The patient intends to name her daughter Marcello Fennel. her healthcare power of attorney. [name] is work number is [phone]. On the patient's 03/01/2014 visit she was given a copy of the  appropriate documents to complete and notarize at her discretion.     HEALTH MAINTENANCE: Social History   Tobacco Use  . Smoking status: Never Smoker  . Smokeless tobacco: Never Used  Vaping Use  . Vaping Use: Never used  Substance Use Topics  . Alcohol use: Not Currently    Comment: occ-rare  . Drug use: No     Colonoscopy: 2000  PAP: May 2015  Bone density: 12/21/2017, T-score of -0.6  Lipid panel: 02/03/2014  Not on File  Current Outpatient Medications  Medication Sig Dispense Refill  . acetaminophen (TYLENOL) 500 MG tablet Take 1,000 mg by mouth every 6 (six) hours as needed for mild pain or moderate pain.    Marland Kitchen gabapentin (NEURONTIN) 300 MG capsule Take 1 capsule (300 mg total) by mouth at bedtime. 90 capsule 4  . hydroxypropyl methylcellulose / hypromellose (ISOPTO TEARS / GONIOVISC) 2.5 % ophthalmic solution Place 1 drop into both eyes daily as needed  for dry eyes.    . Liniments (SALONPAS ARTHRITIS PAIN RELIEF EX) Apply 1 application topically daily as needed (knee pain).    Marland Kitchen lisinopril-hydrochlorothiazide (ZESTORETIC) 20-12.5 MG tablet Take 1/2 tablet daily by mouth 45 tablet 6  . LUMIGAN 0.01 % SOLN     . Multiple Vitamins-Minerals (AIRBORNE PO) Take 1 tablet by mouth 2 (two) times a week.    . ursodiol (ACTIGALL) 500 MG tablet Take 500 mg by mouth 3 (three) times daily.      No current facility-administered medications for this visit.    OBJECTIVE: African-American woman who appears older than stated age 67:   11/28/20 1507  BP: 137/89  Pulse: (!) 108  Resp: 19  Temp: (!) 97.5 F (36.4 C)  SpO2: 100%    Filed Weights   11/28/20 1507  Weight: 244 lb 1.6 oz (110.7 kg)    Body mass index is 45.23 kg/m.     ECOG FS:1 - Symptomatic but completely ambulatory  Sclerae unicteric, EOMs intact Wearing a mask No cervical or supraclavicular adenopathy Lungs no rales or rhonchi Heart regular rate and rhythm Abd soft, nontender, positive bowel sounds MSK no focal spinal tenderness, no upper extremity lymphedema Neuro: nonfocal, well oriented, appropriate affect Breasts: The right breast is benign.  The left breast has undergone mastectomy.  There is no evidence of chest wall recurrence.  Both axillae are benign  LAB RESULTS:  No results found for: SPEP  Lab Results  Component Value Date   WBC 8.1 11/28/2020   NEUTROABS 5.7 11/28/2020   HGB 11.9 (L) 11/28/2020   HCT 35.9 (L) 11/28/2020   MCV 90.7 11/28/2020   PLT 310 11/28/2020      Chemistry      Component Value Date/Time   NA 137 11/28/2020 1445   NA 143 12/07/2019 1742   NA 142 11/13/2016 1050   K 4.3 11/28/2020 1445   K 4.1 11/13/2016 1050   CL 106 11/28/2020 1445   CO2 23 11/28/2020 1445   CO2 27 11/13/2016 1050   BUN 18 11/28/2020 1445   BUN 13 12/07/2019 1742   BUN 11.5 11/13/2016 1050   CREATININE 1.39 (H) 11/28/2020 1445   CREATININE 0.86  01/30/2020 0946   CREATININE 1.0 11/13/2016 1050      Component Value Date/Time   CALCIUM 9.5 11/28/2020 1445   CALCIUM 9.8 11/13/2016 1050   ALKPHOS 131 (H) 11/28/2020 1445   ALKPHOS 140 11/13/2016 1050   AST 17 11/28/2020 1445   AST 12 (L) 01/30/2020 5374  AST 13 11/13/2016 1050   ALT 38 11/28/2020 1445   ALT 14 01/30/2020 0946   ALT 15 11/13/2016 1050   BILITOT 0.7 11/28/2020 1445   BILITOT 1.3 (H) 01/30/2020 0946   BILITOT 0.86 11/13/2016 1050       No results found for: LABCA2  No components found for: [id number]  No results for input(s): INR in the last 168 hours.  Urinalysis    Component Value Date/Time   COLORURINE YELLOW 04/05/2008 1120   APPEARANCEUR CLEAR 04/05/2008 1120   LABSPEC 1.021 04/05/2008 1120   PHURINE 6.0 04/05/2008 1120   GLUCOSEU NEGATIVE 04/05/2008 1120   HGBUR NEGATIVE 04/05/2008 1120   BILIRUBINUR negative 12/07/2019 1212   KETONESUR NEGATIVE 04/05/2008 1120   PROTEINUR Negative 12/07/2019 1212   PROTEINUR NEGATIVE 04/05/2008 1120   UROBILINOGEN 1.0 12/07/2019 1212   UROBILINOGEN 0.2 04/05/2008 1120   NITRITE negative 12/07/2019 1212   NITRITE NEGATIVE 04/05/2008 1120   LEUKOCYTESUR Negative 12/07/2019 1212    STUDIES: ECHOCARDIOGRAM COMPLETE  Result Date: 10/30/2020    ECHOCARDIOGRAM REPORT   Patient Name:   MAYBEL DAMBROSIO Rutten Date of Exam: 10/30/2020 Medical Rec #:  [id number]            Height:       61.6 in Accession #:    [id number]           Weight:       243.8 lb Date of Birth:  April 28, 1951            BSA:          2.070 m Patient Age:    16 years             BP:           142/109 mmHg Patient Gender: F                    HR:           106 bpm. Exam Location:  [facility] Procedure: 2D Echo, Cardiac Doppler, Color Doppler and Strain Analysis Indications:    C50.912 Recurrent cancer of left breast  History:        Patient has prior history of Echocardiogram examinations, most                 recent 03/09/2014. Signs/Symptoms:Shortness  of Breath; Risk                 Factors:Hypertension.  Sonographer:    Marygrace Drought RCS Referring Phys: [place]  1. Left ventricular ejection fraction, by estimation, is 60 to 65%. The left ventricle has normal function. The left ventricle has no regional wall motion abnormalities. Left ventricular diastolic parameters are consistent with Grade I diastolic dysfunction (impaired relaxation).  2. Right ventricular systolic function is normal. The right ventricular size is normal. There is normal pulmonary artery systolic pressure.  3. The mitral valve is grossly normal. No evidence of mitral valve regurgitation.  4. The aortic valve is normal in structure. Aortic valve regurgitation is not visualized. FINDINGS  Left Ventricle: Left ventricular ejection fraction, by estimation, is 60 to 65%. The left ventricle has normal function. The left ventricle has no regional wall motion abnormalities. The left ventricular internal cavity size was normal in size. There is  no left ventricular hypertrophy. Left ventricular diastolic parameters are consistent with Grade I diastolic dysfunction (impaired relaxation). Right Ventricle: The right ventricular size is normal. No increase in right ventricular wall  thickness. Right ventricular systolic function is normal. There is normal pulmonary artery systolic pressure. The tricuspid regurgitant velocity is 2.35 m/s, and  with an assumed right atrial pressure of 3 mmHg, the estimated right ventricular systolic pressure is 54.9 mmHg. Left Atrium: Left atrial size was normal in size. Right Atrium: Right atrial size was normal in size. Pericardium: There is no evidence of pericardial effusion. Mitral Valve: The mitral valve is grossly normal. No evidence of mitral valve regurgitation. Tricuspid Valve: The tricuspid valve is normal in structure. Tricuspid valve regurgitation is trivial. Aortic Valve: The aortic valve is normal in structure. Aortic valve  regurgitation is not visualized. Pulmonic Valve: The pulmonic valve was not well visualized. Pulmonic valve regurgitation is not visualized. Aorta: The aortic root and ascending aorta are structurally normal, with no evidence of dilitation. IAS/Shunts: The atrial septum is grossly normal.  LEFT VENTRICLE PLAX 2D LVIDd:         3.20 cm  Diastology LVIDs:         2.30 cm  LV e' medial:    6.31 cm/s LV PW:         1.00 cm  LV E/e' medial:  12.4 LV IVS:        1.00 cm  LV e' lateral:   5.98 cm/s LVOT diam:     1.70 cm  LV E/e' lateral: 13.1 LV SV:         32 LV SV Index:   15       2D Longitudinal Strain LVOT Area:     2.27 cm 2D Strain GLS Avg:     -15.4 %  RIGHT VENTRICLE RV Basal diam:  2.30 cm RV S prime:     7.83 cm/s TAPSE (M-mode): 1.3 cm RVSP:           25.1 mmHg LEFT ATRIUM             Index       RIGHT ATRIUM           Index LA diam:        2.50 cm 1.21 cm/m  RA Pressure: 3.00 mmHg LA Vol (A2C):   20.9 ml 10.10 ml/m RA Area:     6.97 cm LA Vol (A4C):   20.5 ml 9.90 ml/m  RA Volume:   10.60 ml  5.12 ml/m LA Biplane Vol: 20.7 ml 10.00 ml/m  AORTIC VALVE LVOT Vmax:   91.40 cm/s LVOT Vmean:  59.500 cm/s LVOT VTI:    0.141 m  AORTA Ao Root diam: 2.90 cm Ao Asc diam:  3.10 cm MITRAL VALVE                TRICUSPID VALVE MV Area (PHT):              TR Peak grad:   22.1 mmHg MV Decel Time:              TR Vmax:        235.00 cm/s MV E velocity: 78.40 cm/s   Estimated RAP:  3.00 mmHg MV A velocity: 114.00 cm/s  RVSP:           25.1 mmHg MV E/A ratio:  0.69                             SHUNTS  Systemic VTI:  0.14 m                             Systemic Diam: 1.70 cm Mertie Moores MD Electronically signed by Mertie Moores MD Signature Date/Time: 10/30/2020/2:26:11 PM    Final       ASSESSMENT: 70 y.o. BRCA negative [place] woman s/p left breast upper outer quadrant and left axillary lymph node biopsy 02/22/2014, both positive for a clinical T2 N2, stage IIIA invasive ductal carcinoma,  grade 3, triple negative, with an MIB-1 of 90%  (1) neoadjuvant chemotherapy started 03/13/2014, with cyclophosphamide and doxorubicin in dose dense fashion x4, with Neulasta support on day 2, completed 04/24/2014, followed by weekly carboplatin and paclitaxel x12, paclitaxel reduced during cycle 5 and cycle 7 because of elevated bilirubin levels  (2) left lumpectomy and sentinel lymph node sampling 09/20/2014 showed a complete pathologic response.  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  (b) PALB2 mutations are known to increase the risk of breast and pancreatic cancer, possibly other cancers, but the data is preliminary  (c) the patient's 2 daughters have been tested and did not carry the gene  (d) Mao's MSH2 VUS has been reclassified as "likely benign"  (e) she is status post remote TAH/BSO  (f) intensified screening, with mammography in February and breast MRI in August planned  (6) staging studies showed right-sided lung nodules, too small to characterize, and a dominant right-sided thyroid nodule unchanged in size as compared to 2011; to be followed  (a) chest CT 04/24/2016 showed the small pulmonary nodules to be completely stable, consistent with benign findings.  (b) thyroid ultrasound--done on 07/22/2018 showed enlarged heterogeneous lobular and multinodular thyroid gland, TI-RADS category 3 nodules, not meeting criteria for further biopsy or dedicated imaging f/u.    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  (8) anastrozole started 07/08/2018 for breast  cancer prevention, discontinued 01/30/2020 with disease recurrence  (a) bone density scan 01/01/2018 normal, T score -0.6  (9) RECURRENT DISEASE May 2021  (a) left breast overlapping biopsy 01/20/2020 shows a clinical  T2 N0, stage IIB invasive ductal carcinoma, grade 2 or 3, with moderate estrogen receptor positivity, progesterone receptor negative, HER-2 not amplified, MIB-1 of 80%  (b) left mastectomy on 02/14/2020 showed a pT2 pNX, invasive ductal carcinoma, grade 3, with negative margins   (c) CT of the chest and bone scan on 02/08/2020 shows no distant metastases  (10) started fulvestrant and palbociclib 02/24/2020 discontinued on 03/08/2020  (a) Oncotype sent on surgical sample shows score of 60 indicating a greater than 29% risk of recurrence on antiestrogens alone, and also predicting a significant benefit from chemotherapy.  (b) the Oncotype test found this tumor to be triple negative by single gene scores.   (11) cyclophosphamide, methotrexate, and fluorouracil [CMF] every 28 days x 8 cycles given from 03/27/2020-08/22/2020.  (12) fulvestrant resumed 10/03/2020   PLAN: Leota is doing very well as far as her breast cancer is concerned.  She is tolerating fulvestrant with no side effects that she is aware of.  The plan is to continue  that for 5 years.  She is scheduled for mammography and a bone density already.  She is going to see me in 6 months.  At that time, when she returns to see me, we will discuss some staging studies (CT scan of the chest and a bone scan) which we would obtain at some point before the end of the year to serve as a new baseline for her  I commended her a walking program  Total encounter time 20 minutes.Sarajane Jews C. Magrinat, MD 11/28/20 3:32 PM Medical Oncology and Hematology [facility] [place], [place] [address] Tel. [phone]    Fax. [phone]   I, Wilburn Mylar, am acting as scribe for Dr. Virgie Dad.  Magrinat.  I, Lurline Del MD, have reviewed the above documentation for accuracy and completeness, and I agree with the above.   *Total Encounter Time as defined by the Centers for Medicare and Medicaid Services includes, in addition to the face-to-face time of a patient visit (documented in the note above) non-face-to-face time: obtaining and reviewing outside history, ordering and reviewing medications, tests or procedures, care coordination (communications with other health care professionals or caregivers) and documentation in the medical record.

## 2020-11-28 ENCOUNTER — Inpatient Hospital Stay: Payer: PPO

## 2020-11-28 ENCOUNTER — Inpatient Hospital Stay: Payer: PPO | Attending: Oncology

## 2020-11-28 ENCOUNTER — Other Ambulatory Visit: Payer: Self-pay

## 2020-11-28 ENCOUNTER — Inpatient Hospital Stay: Payer: PPO | Admitting: Oncology

## 2020-11-28 VITALS — BP 137/89 | HR 108 | Temp 97.5°F | Resp 19 | Ht 61.6 in | Wt 244.1 lb

## 2020-11-28 DIAGNOSIS — Z171 Estrogen receptor negative status [ER-]: Secondary | ICD-10-CM

## 2020-11-28 DIAGNOSIS — Z85038 Personal history of other malignant neoplasm of large intestine: Secondary | ICD-10-CM | POA: Diagnosis not present

## 2020-11-28 DIAGNOSIS — C50919 Malignant neoplasm of unspecified site of unspecified female breast: Secondary | ICD-10-CM

## 2020-11-28 DIAGNOSIS — I10 Essential (primary) hypertension: Secondary | ICD-10-CM | POA: Diagnosis not present

## 2020-11-28 DIAGNOSIS — Z1509 Genetic susceptibility to other malignant neoplasm: Secondary | ICD-10-CM | POA: Diagnosis not present

## 2020-11-28 DIAGNOSIS — Z1589 Genetic susceptibility to other disease: Secondary | ICD-10-CM | POA: Diagnosis not present

## 2020-11-28 DIAGNOSIS — Z1502 Genetic susceptibility to malignant neoplasm of ovary: Secondary | ICD-10-CM | POA: Diagnosis not present

## 2020-11-28 DIAGNOSIS — C50412 Malignant neoplasm of upper-outer quadrant of left female breast: Secondary | ICD-10-CM

## 2020-11-28 DIAGNOSIS — C773 Secondary and unspecified malignant neoplasm of axilla and upper limb lymph nodes: Secondary | ICD-10-CM | POA: Insufficient documentation

## 2020-11-28 DIAGNOSIS — Z90710 Acquired absence of both cervix and uterus: Secondary | ICD-10-CM | POA: Diagnosis not present

## 2020-11-28 DIAGNOSIS — Z9012 Acquired absence of left breast and nipple: Secondary | ICD-10-CM | POA: Diagnosis not present

## 2020-11-28 DIAGNOSIS — Z5111 Encounter for antineoplastic chemotherapy: Secondary | ICD-10-CM | POA: Insufficient documentation

## 2020-11-28 DIAGNOSIS — Z923 Personal history of irradiation: Secondary | ICD-10-CM | POA: Diagnosis not present

## 2020-11-28 DIAGNOSIS — Z79899 Other long term (current) drug therapy: Secondary | ICD-10-CM | POA: Diagnosis not present

## 2020-11-28 DIAGNOSIS — C50912 Malignant neoplasm of unspecified site of left female breast: Secondary | ICD-10-CM | POA: Diagnosis not present

## 2020-11-28 DIAGNOSIS — Z95828 Presence of other vascular implants and grafts: Secondary | ICD-10-CM

## 2020-11-28 LAB — CBC WITH DIFFERENTIAL/PLATELET
Abs Immature Granulocytes: 0.03 10*3/uL (ref 0.00–0.07)
Basophils Absolute: 0 10*3/uL (ref 0.0–0.1)
Basophils Relative: 0 %
Eosinophils Absolute: 0.1 10*3/uL (ref 0.0–0.5)
Eosinophils Relative: 1 %
HCT: 35.9 % — ABNORMAL LOW (ref 36.0–46.0)
Hemoglobin: 11.9 g/dL — ABNORMAL LOW (ref 12.0–15.0)
Immature Granulocytes: 0 %
Lymphocytes Relative: 20 %
Lymphs Abs: 1.6 10*3/uL (ref 0.7–4.0)
MCH: 30.1 pg (ref 26.0–34.0)
MCHC: 33.1 g/dL (ref 30.0–36.0)
MCV: 90.7 fL (ref 80.0–100.0)
Monocytes Absolute: 0.6 10*3/uL (ref 0.1–1.0)
Monocytes Relative: 8 %
Neutro Abs: 5.7 10*3/uL (ref 1.7–7.7)
Neutrophils Relative %: 71 %
Platelets: 310 10*3/uL (ref 150–400)
RBC: 3.96 MIL/uL (ref 3.87–5.11)
RDW: 12.3 % (ref 11.5–15.5)
WBC: 8.1 10*3/uL (ref 4.0–10.5)
nRBC: 0 % (ref 0.0–0.2)

## 2020-11-28 LAB — COMPREHENSIVE METABOLIC PANEL
ALT: 38 U/L (ref 0–44)
AST: 17 U/L (ref 15–41)
Albumin: 3.4 g/dL — ABNORMAL LOW (ref 3.5–5.0)
Alkaline Phosphatase: 131 U/L — ABNORMAL HIGH (ref 38–126)
Anion gap: 8 (ref 5–15)
BUN: 18 mg/dL (ref 8–23)
CO2: 23 mmol/L (ref 22–32)
Calcium: 9.5 mg/dL (ref 8.9–10.3)
Chloride: 106 mmol/L (ref 98–111)
Creatinine, Ser: 1.39 mg/dL — ABNORMAL HIGH (ref 0.44–1.00)
GFR, Estimated: 41 mL/min — ABNORMAL LOW (ref >60)
Glucose, Bld: 109 mg/dL — ABNORMAL HIGH (ref 70–99)
Potassium: 4.3 mmol/L (ref 3.5–5.1)
Sodium: 137 mmol/L (ref 135–145)
Total Bilirubin: 0.7 mg/dL (ref 0.3–1.2)
Total Protein: 7.1 g/dL (ref 6.5–8.1)

## 2020-11-28 MED ORDER — SODIUM CHLORIDE 0.9% FLUSH
10.0000 mL | Freq: Once | INTRAVENOUS | Status: AC
  Administered 2020-11-28: 10 mL
  Filled 2020-11-28: qty 10

## 2020-11-28 MED ORDER — FULVESTRANT 250 MG/5ML IM SOLN
INTRAMUSCULAR | Status: AC
  Filled 2020-11-28: qty 5

## 2020-11-28 MED ORDER — FULVESTRANT 250 MG/5ML IM SOLN
500.0000 mg | INTRAMUSCULAR | Status: DC
  Administered 2020-11-28: 500 mg via INTRAMUSCULAR

## 2020-11-30 ENCOUNTER — Telehealth: Payer: Self-pay | Admitting: Oncology

## 2020-11-30 NOTE — Telephone Encounter (Signed)
Scheduled appointments per 3/17 los. Spoke to patient who is aware of appointments dates and times. Will also have updated calendar printed for patient at next visit.

## 2020-12-11 ENCOUNTER — Other Ambulatory Visit (HOSPITAL_COMMUNITY): Payer: Self-pay

## 2020-12-12 ENCOUNTER — Ambulatory Visit (INDEPENDENT_AMBULATORY_CARE_PROVIDER_SITE_OTHER): Payer: PPO

## 2020-12-12 ENCOUNTER — Ambulatory Visit (INDEPENDENT_AMBULATORY_CARE_PROVIDER_SITE_OTHER): Payer: PPO | Admitting: Nurse Practitioner

## 2020-12-12 ENCOUNTER — Encounter: Payer: Self-pay | Admitting: Nurse Practitioner

## 2020-12-12 ENCOUNTER — Other Ambulatory Visit: Payer: Self-pay

## 2020-12-12 VITALS — BP 130/78 | HR 105 | Temp 98.2°F | Ht 61.0 in | Wt 243.2 lb

## 2020-12-12 DIAGNOSIS — C50412 Malignant neoplasm of upper-outer quadrant of left female breast: Secondary | ICD-10-CM | POA: Diagnosis not present

## 2020-12-12 DIAGNOSIS — Z0000 Encounter for general adult medical examination without abnormal findings: Secondary | ICD-10-CM

## 2020-12-12 DIAGNOSIS — Z171 Estrogen receptor negative status [ER-]: Secondary | ICD-10-CM

## 2020-12-12 DIAGNOSIS — I10 Essential (primary) hypertension: Secondary | ICD-10-CM

## 2020-12-12 DIAGNOSIS — Z6841 Body Mass Index (BMI) 40.0 and over, adult: Secondary | ICD-10-CM

## 2020-12-12 DIAGNOSIS — Z85068 Personal history of other malignant neoplasm of small intestine: Secondary | ICD-10-CM

## 2020-12-12 DIAGNOSIS — R7303 Prediabetes: Secondary | ICD-10-CM | POA: Diagnosis not present

## 2020-12-12 LAB — POCT URINALYSIS DIPSTICK
Bilirubin, UA: NEGATIVE
Blood, UA: NEGATIVE
Glucose, UA: NEGATIVE
Ketones, UA: NEGATIVE
Leukocytes, UA: NEGATIVE
Nitrite, UA: NEGATIVE
Protein, UA: NEGATIVE
Spec Grav, UA: 1.025 (ref 1.010–1.025)
Urobilinogen, UA: 0.2 E.U./dL
pH, UA: 5.5 (ref 5.0–8.0)

## 2020-12-12 LAB — POCT UA - MICROALBUMIN
Albumin/Creatinine Ratio, Urine, POC: <30
Creatinine, POC: 300 mg/dL
Microalbumin Ur, POC: 10 mg/L

## 2020-12-12 NOTE — Patient Instructions (Signed)

## 2020-12-12 NOTE — Addendum Note (Signed)
Addended by: Kellie Simmering on: 12/12/2020 12:37 PM   Modules accepted: Orders

## 2020-12-12 NOTE — Progress Notes (Signed)
This visit occurred during the SARS-CoV-2 public health emergency.  Safety protocols were in place, including screening questions prior to the visit, additional usage of staff PPE, and extensive cleaning of exam room while observing appropriate contact time as indicated for disinfecting solutions.  Subjective:   [name] is a 70 y.o. female who presents for Medicare Annual (Subsequent) preventive examination.  Review of Systems     Cardiac Risk Factors include: advanced age (>55men, >30 women);hypertension;obesity (BMI >30kg/m2);sedentary lifestyle     Objective:    Today's Vitals   12/12/20 0859  BP: 130/78  Pulse: (!) 105  Temp: 98.2 F (36.8 C)  TempSrc: Oral  SpO2: 99%  Weight: 243 lb 3.2 oz (110.3 kg)  Height: 5\' 1"  (1.549 m)   Body mass index is 45.95 kg/m.  Advanced Directives 12/12/2020 07/11/2020 06/20/2020 05/30/2020 05/09/2020 04/18/2020 03/22/2020  Does Patient Have a Medical Advance Directive? No Yes Yes Yes Yes Yes No  Type of Advance Directive - Living will Living will Living will Living will Living will -  Does patient want to make changes to medical advance directive? - No - Patient declined - - - - -  Copy of [place] in Chart? - - - - - - -  Would patient like information on creating a medical advance directive? No - Patient declined No - Patient declined No - Patient declined No - Patient declined No - Patient declined No - Patient declined No - Patient declined    Current Medications (verified) Outpatient Encounter Medications as of 12/12/2020  Medication Sig  . acetaminophen (TYLENOL) 500 MG tablet Take 1,000 mg by mouth every 6 (six) hours as needed for mild pain or moderate pain.  Marland Kitchen gabapentin (NEURONTIN) 300 MG capsule Take 1 capsule (300 mg total) by mouth at bedtime.  . hydroxypropyl methylcellulose / hypromellose (ISOPTO TEARS / GONIOVISC) 2.5 % ophthalmic solution Place 1 drop into both eyes daily as needed for dry eyes.  .  Liniments (SALONPAS ARTHRITIS PAIN RELIEF EX) Apply 1 application topically daily as needed (knee pain).  Marland Kitchen lisinopril-hydrochlorothiazide (ZESTORETIC) 20-12.5 MG tablet Take 1/2 tablet daily by mouth  . LUMIGAN 0.01 % SOLN   . Multiple Vitamins-Minerals (AIRBORNE PO) Take 1 tablet by mouth 2 (two) times a week.  . ursodiol (ACTIGALL) 500 MG tablet Take 500 mg by mouth 3 (three) times daily.   . [DISCONTINUED] prochlorperazine (COMPAZINE) 10 MG tablet Take 1 tablet (10 mg total) by mouth every 6 (six) hours as needed (Nausea or vomiting).   No facility-administered encounter medications on file as of 12/12/2020.    Allergies (verified) Patient has no allergy information on record.   History: Past Medical History:  Diagnosis Date  . Colon cancer ([place])   . Hx of rotator cuff surgery   . Hypertension   . left breast cancer   . Personal history of chemotherapy 2016   left  . Personal history of radiation therapy 2016   left breast   . Radiation 11/15/14-01/02/15   left breast, axillary and supraclavicular region 45 gray, lumpectomy cavity boosted to 16 gray   Past Surgical History:  Procedure Laterality Date  . ABDOMINAL HYSTERECTOMY  1988  . BREAST EXCISIONAL BIOPSY Right 12/21/2017  . BREAST LUMPECTOMY Left 2016  . COLON SURGERY  1988   colectomy/colostomy-ca  . COLON SURGERY  1988   colostomy takedown-reversal  . COLONOSCOPY    . EXCISION / BIOPSY BREAST / NIPPLE / DUCT Left 09/20/2014  . IR  IMAGING GUIDED PORT INSERTION  03/22/2020  . MASTECTOMY Left 02/14/2020  . MASTECTOMY W/ SENTINEL NODE BIOPSY Left 02/14/2020   Procedure: LEFT MASTECTOMY WITH BLUE DYE INJECTION;  Surgeon: Rolm Bookbinder, MD;  Location: [facility];  Service: General;  Laterality: Left;  PEC BLOCK  . RADIOACTIVE SEED GUIDED EXCISIONAL BREAST BIOPSY Right 12/21/2017   Procedure: RIGHT RADIOACTIVE SEED GUIDED EXCISIONAL BREAST BIOPSY ERAS PATHWAY;  Surgeon: Rolm Bookbinder, MD;  Location: [facility];  Service: General;  Laterality: Right;  . RADIOACTIVE SEED GUIDED PARTIAL MASTECTOMY WITH AXILLARY SENTINEL LYMPH NODE BIOPSY Left 09/20/2014   Procedure: RADIOACTIVE SEED GUIDED LEFT BREAST LUMPECTOMY WITH AXILLARY SENTINEL LYMPH NODE BIOPSY;  Surgeon: Rolm Bookbinder, MD;  Location: [facility];  Service: General;  Laterality: Left;  . TOTAL SHOULDER ARTHROPLASTY  2009   right   Family History  Problem Relation Age of Onset  . Cancer Mother 36       breast  . Breast cancer Mother        early 51s  . Stroke Father 28       cause of death  . Diabetes Father   . Cancer Maternal Aunt        breast cancer at unknown age  . Breast cancer Maternal Aunt   . Ovarian cancer Neg Hx    Social History   Socioeconomic History  . Marital status: Divorced    Spouse name: Not on file  . Number of children: 2  . Years of education: Not on file  . Highest education level: Not on file  Occupational History    Employer: [employer]    Comment: retired in 2008  Tobacco Use  . Smoking status: Never Smoker  . Smokeless tobacco: Never Used  Vaping Use  . Vaping Use: Never used  Substance and Sexual Activity  . Alcohol use: Not Currently    Comment: occ-rare  . Drug use: No  . Sexual activity: Not Currently  Other Topics Concern  . Not on file  Social History Narrative  . Not on file   Social Determinants of Health   Financial Resource Strain: Low Risk   . Difficulty of Paying Living Expenses: Not hard at all  Food Insecurity: No Food Insecurity  . Worried About Charity fundraiser in the Last Year: Never true  . Ran Out of Food in the Last Year: Never true  Transportation Needs: No Transportation Needs  . Lack of Transportation (Medical): No  . Lack of Transportation (Non-Medical): No  Physical Activity: Inactive  . Days of Exercise per Week: 0 days  . Minutes of Exercise per Session: 0 min  Stress: No Stress Concern Present  . Feeling of Stress : Not at all   Social Connections: Not on file    Tobacco Counseling Counseling given: Not Answered   Clinical Intake:  Pre-visit preparation completed: Yes  Pain : No/denies pain     Nutritional Status: BMI > 30  Obese Diabetes: No  How often do you need to have someone help you when you read instructions, pamphlets, or other written materials from your doctor or pharmacy?: 1 - Never What is the last grade level you completed in school?: 12th grade  Diabetic? no  Interpreter Needed?: No  Information entered by :: NAllen LPN   Activities of Daily Living In your present state of health, do you have any difficulty performing the following activities: 12/12/2020 02/14/2020  Hearing? N -  Vision? N -  Difficulty concentrating or making decisions? N -  Walking or climbing stairs? Y -  Comment SOB -  Dressing or bathing? N -  Doing errands, shopping? N N  Preparing Food and eating ? N -  Using the Toilet? N -  In the past six months, have you accidently leaked urine? Y -  Comment wears depends and pad -  Do you have problems with loss of bowel control? N -  Managing your Medications? N -  Managing your Finances? N -  Housekeeping or managing your Housekeeping? Y -  Some recent data might be hidden    Patient Care Team: Minette Brine, FNP as PCP - General (General Practice) Magrinat, Virgie Dad, MD as Consulting Physician (Oncology) Rolm Bookbinder, MD as Consulting Physician (General Surgery) Gery Pray, MD as Consulting Physician (Radiation Oncology) [name], MD as Consulting Physician (Ophthalmology)  Indicate any recent [place] you may have received from other than [facility] providers in the past year (date may be approximate).     Assessment:   This is a routine wellness examination for [place].  Hearing/Vision screen  Hearing Screening   125Hz  250Hz  500Hz  1000Hz  2000Hz  3000Hz  4000Hz  6000Hz  8000Hz   Right ear:           Left ear:           Vision Screening  Comments: Regular eye exams. Dr. Venetia Maxon  Dietary issues and exercise activities discussed: Current Exercise Habits: The patient does not participate in regular exercise at present  Goals    .  Patient Stated (pt-stated)      No goals    .  Patient Stated      12/07/2019, no goals    .  Patient Stated      12/12/2020, no goals      Depression Screen PHQ 2/9 Scores 12/12/2020 12/07/2019 05/12/2019 11/11/2018 10/04/2014 07/10/2014  PHQ - 2 Score 0 0 0 0 0 0  PHQ- 9 Score - 3 - 3 - -    Fall Risk Fall Risk  12/12/2020 12/07/2019 05/12/2019 11/11/2018 08/05/2018  Falls in the past year? 0 0 0 0 0  Comment - - - - Emmi Telephone Survey: data to providers prior to load  Risk for fall due to : Medication side effect Medication side effect - Medication side effect -  Follow up Falls evaluation completed;Education provided;Falls prevention discussed Falls evaluation completed;Education provided;Falls prevention discussed - Education provided;Falls prevention discussed -    FALL RISK PREVENTION PERTAINING TO THE HOME:  Any stairs in or around the home? Yes  If so, are there any without handrails? No  Home free of loose throw rugs in walkways, pet beds, electrical cords, etc? Yes  Adequate lighting in your home to reduce risk of falls? Yes   ASSISTIVE DEVICES UTILIZED TO PREVENT FALLS:  Life alert? No  Use of a cane, walker or w/c? Yes  Grab bars in the bathroom? Yes  Shower chair or bench in shower? Yes  Elevated toilet seat or a handicapped toilet? No   TIMED UP AND GO:  Was the test performed? No .    Gait slow and steady with assistive device  Cognitive Function:     6CIT Screen 12/12/2020 12/07/2019 11/11/2018  What Year? 0 points 0 points 0 points  What month? 0 points 0 points 0 points  What time? 0 points 0 points 0 points  Count back from 20 0 points 0 points 0 points  Months in reverse 0 points 0  points 0 points  Repeat phrase 0 points 2 points 0 points  Total Score 0 2  0    Immunizations Immunization History  Administered Date(s) Administered  . Influenza, High Dose Seasonal PF 05/12/2019  . Moderna SARS-COV2 Booster Vaccination 11/13/2020  . Moderna Sars-Covid-2 Vaccination 10/30/2019, 11/27/2019    TDAP status: Up to date  Flu Vaccine status: Declined, Education has been provided regarding the importance of this vaccine but patient still declined. Advised may receive this vaccine at local pharmacy or Health Dept. Aware to provide a copy of the vaccination record if obtained from local pharmacy or Health Dept. Verbalized acceptance and understanding.  Pneumococcal vaccine status: Declined,  Education has been provided regarding the importance of this vaccine but patient still declined. Advised may receive this vaccine at local pharmacy or Health Dept. Aware to provide a copy of the vaccination record if obtained from local pharmacy or Health Dept. Verbalized acceptance and understanding.   Covid-19 vaccine status: Completed vaccines  Qualifies for Shingles Vaccine? Yes   Zostavax completed No   Shingrix Completed?: No.    Education has been provided regarding the importance of this vaccine. Patient has been advised to call insurance company to determine out of pocket expense if they have not yet received this vaccine. Advised may also receive vaccine at local pharmacy or Health Dept. Verbalized acceptance and understanding.  Screening Tests Health Maintenance  Topic Date Due  . INFLUENZA VACCINE  06/14/2021 (Originally 04/15/2020)  . PNA vac Low Risk Adult (1 of 2 - PCV13) 12/12/2021 (Originally 01/08/2016)  . COVID-19 Vaccine (4 - Booster for Moderna series) 05/16/2021  . MAMMOGRAM  11/14/2021  . COLONOSCOPY (Pts 45-4yrs Insurance coverage will need to be confirmed)  08/13/2028  . TETANUS/TDAP  11/11/2028  . DEXA SCAN  Completed  . Hepatitis C Screening  Completed  . HPV VACCINES  Aged Out    Health Maintenance  There are no preventive care  reminders to display for this patient.  Colorectal cancer screening: Type of screening: Colonoscopy. Completed 08/13/2018. Repeat every 10 years  Mammogram status: scheduled 12/18/2020  Bone Density status: scheduled 03/28/2021  Lung Cancer Screening: (Low Dose CT Chest recommended if Age 35-80 years, 30 pack-year currently smoking OR have quit w/in 15years.) does not qualify.   Lung Cancer Screening Referral: no  Additional Screening:  Hepatitis C Screening: does qualify; Completed 11/11/2018  Vision Screening: Recommended annual ophthalmology exams for early detection of glaucoma and other disorders of the eye. Is the patient up to date with their annual eye exam?  Yes  Who is the provider or what is the name of the office in which the patient attends annual eye exams?  Dr. Venetia Maxon If pt is not established with a provider, would they like to be referred to a provider to establish care? No .   Dental Screening: Recommended annual dental exams for proper oral hygiene  Community Resource Referral / Chronic Care Management: CRR required this visit?  No   CCM required this visit?  No      Plan:     I have personally reviewed and noted the following in the patient's chart:   . Medical and social history . Use of alcohol, tobacco or illicit drugs  . Current medications and supplements . Functional ability and status . Nutritional status . Physical activity . Advanced directives . List of other physicians . Hospitalizations, surgeries, and ER visits in previous 12 months . Vitals . Screenings to include cognitive, depression, and  falls . Referrals and appointments  In addition, I have reviewed and discussed with patient certain preventive protocols, quality metrics, and best practice recommendations. A written personalized care plan for preventive services as well as general preventive health recommendations were provided to patient.     Kellie Simmering, LPN   11/06/7979    Nurse Notes:

## 2020-12-12 NOTE — Patient Instructions (Signed)
[name] , Thank you for taking time to come for your Medicare Wellness Visit. I appreciate your ongoing commitment to your health goals. Please review the following plan we discussed and let me know if I can assist you in the future.   Screening recommendations/referrals: Colonoscopy: completed 08/13/2018 Mammogram: scheduled 12/18/2020 Bone Density: scheduled 03/28/2021 Recommended yearly ophthalmology/optometry visit for glaucoma screening and checkup Recommended yearly dental visit for hygiene and checkup  Vaccinations: Influenza vaccine: decline Pneumococcal vaccine: decline Tdap vaccine: completed 11/11/2018, due 11/11/2028 Shingles vaccine: discussed   Covid-19: 11/13/2020, 11/27/2019, 10/30/2019  Advanced directives: Advance directive discussed with you today. Even though you declined this today please call our office should you change your mind and we can give you the proper paperwork for you to fill out.  Conditions/risks identified: none  Next appointment: Follow up in one year for your annual wellness visit    Preventive Care 70 Years and Older, Female Preventive care refers to lifestyle choices and visits with your health care provider that can promote health and wellness. What does preventive care include?  A yearly physical exam. This is also called an annual well check.  Dental exams once or twice a year.  Routine eye exams. Ask your health care provider how often you should have your eyes checked.  Personal lifestyle choices, including:  Daily care of your teeth and gums.  Regular physical activity.  Eating a healthy diet.  Avoiding tobacco and drug use.  Limiting alcohol use.  Practicing safe sex.  Taking low-dose aspirin every day.  Taking vitamin and mineral supplements as recommended by your health care provider. What happens during an annual well check? The services and screenings done by your health care provider during your annual well check will  depend on your age, overall health, lifestyle risk factors, and family history of disease. Counseling  Your health care provider may ask you questions about your:  Alcohol use.  Tobacco use.  Drug use.  Emotional well-being.  Home and relationship well-being.  Sexual activity.  Eating habits.  History of falls.  Memory and ability to understand (cognition).  Work and work Statistician.  Reproductive health. Screening  You may have the following tests or measurements:  Height, weight, and BMI.  Blood pressure.  Lipid and cholesterol levels. These may be checked every 5 years, or more frequently if you are over 62 years old.  Skin check.  Lung cancer screening. You may have this screening every year starting at age 49 if you have a 30-pack-year history of smoking and currently smoke or have quit within the past 15 years.  Fecal occult blood test (FOBT) of the stool. You may have this test every year starting at age 67.  Flexible sigmoidoscopy or colonoscopy. You may have a sigmoidoscopy every 5 years or a colonoscopy every 10 years starting at age 39.  Hepatitis C blood test.  Hepatitis B blood test.  Sexually transmitted disease (STD) testing.  Diabetes screening. This is done by checking your blood sugar (glucose) after you have not eaten for a while (fasting). You may have this done every 1-3 years.  Bone density scan. This is done to screen for osteoporosis. You may have this done starting at age 85.  Mammogram. This may be done every 1-2 years. Talk to your health care provider about how often you should have regular mammograms. Talk with your health care provider about your test results, treatment options, and if necessary, the need for more tests. Vaccines  Your health care provider may recommend certain vaccines, such as:  Influenza vaccine. This is recommended every year.  Tetanus, diphtheria, and acellular pertussis (Tdap, Td) vaccine. You may need a  Td booster every 10 years.  Zoster vaccine. You may need this after age 24.  Pneumococcal 13-valent conjugate (PCV13) vaccine. One dose is recommended after age 20.  Pneumococcal polysaccharide (PPSV23) vaccine. One dose is recommended after age 38. Talk to your health care provider about which screenings and vaccines you need and how often you need them. This information is not intended to replace advice given to you by your health care provider. Make sure you discuss any questions you have with your health care provider. Document Released: 09/28/2015 Document Revised: 05/21/2016 Document Reviewed: 07/03/2015 Elsevier Interactive Patient Education  2017 [place] Prevention in the Home Falls can cause injuries. They can happen to people of all ages. There are many things you can do to make your home safe and to help prevent falls. What can I do on the outside of my home?  Regularly fix the edges of walkways and driveways and fix any cracks.  Remove anything that might make you trip as you walk through a door, such as a raised step or threshold.  Trim any bushes or trees on the path to your home.  Use bright outdoor lighting.  Clear any walking paths of anything that might make someone trip, such as rocks or tools.  Regularly check to see if handrails are loose or broken. Make sure that both sides of any steps have handrails.  Any raised decks and porches should have guardrails on the edges.  Have any leaves, snow, or ice cleared regularly.  Use sand or salt on walking paths during winter.  Clean up any spills in your garage right away. This includes oil or grease spills. What can I do in the bathroom?  Use night lights.  Install grab bars by the toilet and in the tub and shower. Do not use towel bars as grab bars.  Use non-skid mats or decals in the tub or shower.  If you need to sit down in the shower, use a plastic, non-slip stool.  Keep the floor dry. Clean  up any water that spills on the floor as soon as it happens.  Remove soap buildup in the tub or shower regularly.  Attach bath mats securely with double-sided non-slip rug tape.  Do not have throw rugs and other things on the floor that can make you trip. What can I do in the bedroom?  Use night lights.  Make sure that you have a light by your bed that is easy to reach.  Do not use any sheets or blankets that are too big for your bed. They should not hang down onto the floor.  Have a firm chair that has side arms. You can use this for support while you get dressed.  Do not have throw rugs and other things on the floor that can make you trip. What can I do in the kitchen?  Clean up any spills right away.  Avoid walking on wet floors.  Keep items that you use a lot in easy-to-reach places.  If you need to reach something above you, use a strong step stool that has a grab bar.  Keep electrical cords out of the way.  Do not use floor polish or wax that makes floors slippery. If you must use wax, use non-skid floor wax.  Do  not have throw rugs and other things on the floor that can make you trip. What can I do with my stairs?  Do not leave any items on the stairs.  Make sure that there are handrails on both sides of the stairs and use them. Fix handrails that are broken or loose. Make sure that handrails are as long as the stairways.  Check any carpeting to make sure that it is firmly attached to the stairs. Fix any carpet that is loose or worn.  Avoid having throw rugs at the top or bottom of the stairs. If you do have throw rugs, attach them to the floor with carpet tape.  Make sure that you have a light switch at the top of the stairs and the bottom of the stairs. If you do not have them, ask someone to add them for you. What else can I do to help prevent falls?  Wear shoes that:  Do not have high heels.  Have rubber bottoms.  Are comfortable and fit you well.  Are  closed at the toe. Do not wear sandals.  If you use a stepladder:  Make sure that it is fully opened. Do not climb a closed stepladder.  Make sure that both sides of the stepladder are locked into place.  Ask someone to hold it for you, if possible.  Clearly mark and make sure that you can see:  Any grab bars or handrails.  First and last steps.  Where the edge of each step is.  Use tools that help you move around (mobility aids) if they are needed. These include:  Canes.  Walkers.  Scooters.  Crutches.  Turn on the lights when you go into a dark area. Replace any light bulbs as soon as they burn out.  Set up your furniture so you have a clear path. Avoid moving your furniture around.  If any of your floors are uneven, fix them.  If there are any pets around you, be aware of where they are.  Review your medicines with your doctor. Some medicines can make you feel dizzy. This can increase your chance of falling. Ask your doctor what other things that you can do to help prevent falls. This information is not intended to replace advice given to you by your health care provider. Make sure you discuss any questions you have with your health care provider. Document Released: 06/28/2009 Document Revised: 02/07/2016 Document Reviewed: 10/06/2014 Elsevier Interactive Patient Education  2017 [employer].

## 2020-12-12 NOTE — Progress Notes (Signed)
Rutherford Nail as a scribe for Minette Brine, FNP.,have documented all relevant documentation on the behalf of Minette Brine, FNP,as directed by  Minette Brine, FNP while in the presence of Minette Brine, [place].  This visit occurred during the SARS-CoV-2 public health emergency.  Safety protocols were in place, including screening questions prior to the visit, additional usage of staff PPE, and extensive cleaning of exam room while observing appropriate contact time as indicated for disinfecting solutions.  Subjective:     [name] , female    DOB: 04-21-1951 , 70 y.o.   MRN: [id number]   Chief Complaint  Patient presents with  . Hypertension    HPI  Pt here today for hypertension check, she has no other complaints today. Continues to see the Oncologist monthly with her last visit this month.      Hypertension This is a chronic problem. The current episode started more than 1 year ago. The problem is unchanged. The problem is controlled. Associated symptoms include shortness of breath (intermittent dyspnea with exertion - has seen cardiologist and was normal.). Pertinent negatives include no anxiety, chest pain, headaches or palpitations. There are no associated agents to hypertension. Risk factors for coronary artery disease include sedentary lifestyle and obesity. Past treatments include diuretics and ACE inhibitors. Compliance problems include exercise.  There is no history of angina. There is no history of chronic renal disease.     Past Medical History:  Diagnosis Date  . Colon cancer ([place])   . Hx of rotator cuff surgery   . Hypertension   . left breast cancer   . Personal history of chemotherapy 2016   left  . Personal history of radiation therapy 2016   left breast   . Radiation 11/15/14-01/02/15   left breast, axillary and supraclavicular region 45 gray, lumpectomy cavity boosted to 16 gray     Family History  Problem Relation Age of Onset  .  Cancer Mother 34       breast  . Breast cancer Mother        early 67s  . Stroke Father 41       cause of death  . Diabetes Father   . Cancer Maternal Aunt        breast cancer at unknown age  . Breast cancer Maternal Aunt   . Ovarian cancer Neg Hx      Current Outpatient Medications:  .  acetaminophen (TYLENOL) 500 MG tablet, Take 1,000 mg by mouth every 6 (six) hours as needed for mild pain or moderate pain., Disp: , Rfl:  .  gabapentin (NEURONTIN) 300 MG capsule, Take 1 capsule (300 mg total) by mouth at bedtime., Disp: 90 capsule, Rfl: 4 .  hydroxypropyl methylcellulose / hypromellose (ISOPTO TEARS / GONIOVISC) 2.5 % ophthalmic solution, Place 1 drop into both eyes daily as needed for dry eyes., Disp: , Rfl:  .  Liniments (SALONPAS ARTHRITIS PAIN RELIEF EX), Apply 1 application topically daily as needed (knee pain)., Disp: , Rfl:  .  lisinopril-hydrochlorothiazide (ZESTORETIC) 20-12.5 MG tablet, TAKE 1/2 TABLET BY MOUTH DAILY, Disp: 45 tablet, Rfl: 5 .  LUMIGAN 0.01 % SOLN, , Disp: , Rfl:  .  Multiple Vitamins-Minerals (AIRBORNE PO), Take 1 tablet by mouth 2 (two) times a week., Disp: , Rfl:  .  ursodiol (ACTIGALL) 500 MG tablet, Take 500 mg by mouth 3 (three) times daily. , Disp: , Rfl:    Not on File   Review of Systems  Constitutional:  Negative.  Negative for fatigue.  HENT: Negative.   Respiratory: Positive for shortness of breath (intermittent dyspnea with exertion - has seen cardiologist and was normal.).   Cardiovascular: Negative for chest pain, palpitations and leg swelling.  Gastrointestinal: Negative.   Endocrine: Negative for polydipsia, polyphagia and polyuria.  Musculoskeletal: Negative.   Skin: Negative.   Neurological: Negative for dizziness and headaches.  Psychiatric/Behavioral: Negative.      Today's Vitals   12/12/20 0929  BP: 130/78  Pulse: (!) 105  Temp: 98.2 F (36.8 C)  Weight: 243 lb 3.2 oz (110.3 kg)  Height: 5\' 1"  (1.549 m)   Body mass  index is 45.95 kg/m.  Wt Readings from Last 3 Encounters:  12/12/20 243 lb 3.2 oz (110.3 kg)  12/12/20 243 lb 3.2 oz (110.3 kg)  11/28/20 244 lb 1.6 oz (110.7 kg)   Objective:  Physical Exam Vitals reviewed.  Constitutional:      General: She is not in acute distress.    Appearance: Normal appearance. She is obese.  HENT:     Head: Normocephalic.     Right Ear: Tympanic membrane, ear canal and external ear normal. There is no impacted cerumen.     Left Ear: Tympanic membrane, ear canal and external ear normal. There is no impacted cerumen.  Eyes:     Extraocular Movements: Extraocular movements intact.     Conjunctiva/sclera: Conjunctivae normal.     Pupils: Pupils are equal, round, and reactive to light.  Cardiovascular:     Rate and Rhythm: Normal rate and regular rhythm.     Pulses: Normal pulses.     Heart sounds: Normal heart sounds. No murmur heard.   Pulmonary:     Effort: Pulmonary effort is normal. No respiratory distress.     Breath sounds: Normal breath sounds. No wheezing.  Skin:    General: Skin is warm and dry.     Capillary Refill: Capillary refill takes less than 2 seconds.  Neurological:     General: No focal deficit present.     Mental Status: She is alert and oriented to person, place, and time.     Cranial Nerves: No cranial nerve deficit.  Psychiatric:        Mood and Affect: Mood normal.        Behavior: Behavior normal.        Thought Content: Thought content normal.        Judgment: Judgment normal.         Assessment And Plan:     1. Essential hypertension  Chronic, fairly controlled  Continue with current medications  2. Prediabetes  Chronic, stable  Diet controlled  Continue with regular exercise as tolerated - Hemoglobin A1c  3. Malignant neoplasm of upper-outer quadrant of left breast in female, estrogen receptor negative ([place]) Continue follow up with Oncology  4. History of malignant neoplasm of jejunum  I read this in  the patients hospital notes she does not recall having this diagnosis.  5. BMI 45.0-49.9, adult ([place])  She is encouraged to initially strive for BMI less than 30 to decrease cardiac risk. He is advised to exercise no less than 150 minutes per week.    Patient was given opportunity to ask questions. Patient verbalized understanding of the plan and was able to repeat key elements of the plan. All questions were answered to their satisfaction.  Minette Brine, FNP   I, Minette Brine, FNP, have reviewed all documentation for this visit. The documentation on 12/16/20  for the exam, diagnosis, procedures, and orders are all accurate and complete.   IF YOU HAVE BEEN REFERRED TO A SPECIALIST, IT MAY TAKE 1-2 WEEKS TO SCHEDULE/PROCESS THE REFERRAL. IF YOU HAVE NOT HEARD FROM US/SPECIALIST IN TWO WEEKS, PLEASE GIVE [place] A CALL AT [phone] X [phone].   THE PATIENT IS ENCOURAGED TO PRACTICE SOCIAL DISTANCING DUE TO THE COVID-19 PANDEMIC.

## 2020-12-17 ENCOUNTER — Other Ambulatory Visit: Payer: PPO

## 2020-12-17 ENCOUNTER — Ambulatory Visit: Payer: PPO | Admitting: Oncology

## 2020-12-18 ENCOUNTER — Ambulatory Visit: Payer: PPO

## 2020-12-26 ENCOUNTER — Other Ambulatory Visit: Payer: Self-pay

## 2020-12-26 ENCOUNTER — Inpatient Hospital Stay: Payer: PPO

## 2020-12-26 ENCOUNTER — Inpatient Hospital Stay: Payer: PPO | Attending: Oncology

## 2020-12-26 VITALS — BP 125/79 | HR 98 | Temp 98.9°F | Resp 20

## 2020-12-26 DIAGNOSIS — Z79899 Other long term (current) drug therapy: Secondary | ICD-10-CM | POA: Insufficient documentation

## 2020-12-26 DIAGNOSIS — C773 Secondary and unspecified malignant neoplasm of axilla and upper limb lymph nodes: Secondary | ICD-10-CM | POA: Insufficient documentation

## 2020-12-26 DIAGNOSIS — Z5111 Encounter for antineoplastic chemotherapy: Secondary | ICD-10-CM | POA: Diagnosis not present

## 2020-12-26 DIAGNOSIS — C50412 Malignant neoplasm of upper-outer quadrant of left female breast: Secondary | ICD-10-CM

## 2020-12-26 DIAGNOSIS — Z1502 Genetic susceptibility to malignant neoplasm of ovary: Secondary | ICD-10-CM

## 2020-12-26 DIAGNOSIS — Z95828 Presence of other vascular implants and grafts: Secondary | ICD-10-CM

## 2020-12-26 DIAGNOSIS — C50919 Malignant neoplasm of unspecified site of unspecified female breast: Secondary | ICD-10-CM

## 2020-12-26 DIAGNOSIS — Z1509 Genetic susceptibility to other malignant neoplasm: Secondary | ICD-10-CM

## 2020-12-26 LAB — CBC WITH DIFFERENTIAL/PLATELET
Abs Immature Granulocytes: 0.03 10*3/uL (ref 0.00–0.07)
Basophils Absolute: 0 10*3/uL (ref 0.0–0.1)
Basophils Relative: 0 %
Eosinophils Absolute: 0 10*3/uL (ref 0.0–0.5)
Eosinophils Relative: 0 %
HCT: 35.1 % — ABNORMAL LOW (ref 36.0–46.0)
Hemoglobin: 11.7 g/dL — ABNORMAL LOW (ref 12.0–15.0)
Immature Granulocytes: 0 %
Lymphocytes Relative: 17 %
Lymphs Abs: 1.4 10*3/uL (ref 0.7–4.0)
MCH: 29.3 pg (ref 26.0–34.0)
MCHC: 33.3 g/dL (ref 30.0–36.0)
MCV: 88 fL (ref 80.0–100.0)
Monocytes Absolute: 0.6 10*3/uL (ref 0.1–1.0)
Monocytes Relative: 8 %
Neutro Abs: 6 10*3/uL (ref 1.7–7.7)
Neutrophils Relative %: 75 %
Platelets: 308 10*3/uL (ref 150–400)
RBC: 3.99 MIL/uL (ref 3.87–5.11)
RDW: 12.5 % (ref 11.5–15.5)
WBC: 8.1 10*3/uL (ref 4.0–10.5)
nRBC: 0 % (ref 0.0–0.2)

## 2020-12-26 LAB — COMPREHENSIVE METABOLIC PANEL
ALT: 27 U/L (ref 0–44)
AST: 17 U/L (ref 15–41)
Albumin: 3.6 g/dL (ref 3.5–5.0)
Alkaline Phosphatase: 115 U/L (ref 38–126)
Anion gap: 14 (ref 5–15)
BUN: 23 mg/dL (ref 8–23)
CO2: 21 mmol/L — ABNORMAL LOW (ref 22–32)
Calcium: 9.9 mg/dL (ref 8.9–10.3)
Chloride: 107 mmol/L (ref 98–111)
Creatinine, Ser: 1.1 mg/dL — ABNORMAL HIGH (ref 0.44–1.00)
GFR, Estimated: 54 mL/min — ABNORMAL LOW (ref >60)
Glucose, Bld: 116 mg/dL — ABNORMAL HIGH (ref 70–99)
Potassium: 3.6 mmol/L (ref 3.5–5.1)
Sodium: 142 mmol/L (ref 135–145)
Total Bilirubin: 0.5 mg/dL (ref 0.3–1.2)
Total Protein: 7.4 g/dL (ref 6.5–8.1)

## 2020-12-26 MED ORDER — HEPARIN SOD (PORK) LOCK FLUSH 100 UNIT/ML IV SOLN
500.0000 [IU] | Freq: Once | INTRAVENOUS | Status: AC
  Administered 2020-12-26: 500 [IU]
  Filled 2020-12-26: qty 5

## 2020-12-26 MED ORDER — FULVESTRANT 250 MG/5ML IM SOLN
500.0000 mg | INTRAMUSCULAR | Status: DC
  Administered 2020-12-26: 500 mg via INTRAMUSCULAR

## 2020-12-26 MED ORDER — SODIUM CHLORIDE 0.9% FLUSH
10.0000 mL | Freq: Once | INTRAVENOUS | Status: AC
Start: 2020-12-26 — End: 2020-12-26
  Administered 2020-12-26: 10 mL
  Filled 2020-12-26: qty 10

## 2020-12-26 MED ORDER — FULVESTRANT 250 MG/5ML IM SOLN
INTRAMUSCULAR | Status: AC
  Filled 2020-12-26: qty 10

## 2020-12-26 NOTE — Patient Instructions (Signed)
Fulvestrant injection What is this medicine? FULVESTRANT (ful VES trant) blocks the effects of estrogen. It is used to treat breast cancer. This medicine may be used for other purposes; ask your health care provider or pharmacist if you have questions. COMMON BRAND NAME(S): FASLODEX What should I tell my health care provider before I take this medicine? They need to know if you have any of these conditions:  bleeding disorders  liver disease  low blood counts, like low white cell, platelet, or red cell counts  an unusual or allergic reaction to fulvestrant, other medicines, foods, dyes, or preservatives  pregnant or trying to get pregnant  breast-feeding How should I use this medicine? This medicine is for injection into a muscle. It is usually given by a health care professional in a hospital or clinic setting. Talk to your pediatrician regarding the use of this medicine in children. Special care may be needed. Overdosage: If you think you have taken too much of this medicine contact a poison control center or emergency room at once. NOTE: This medicine is only for you. Do not share this medicine with others. What if I miss a dose? It is important not to miss your dose. Call your doctor or health care professional if you are unable to keep an appointment. What may interact with this medicine?  medicines that treat or prevent blood clots like warfarin, enoxaparin, dalteparin, apixaban, dabigatran, and rivaroxaban This list may not describe all possible interactions. Give your health care provider a list of all the medicines, herbs, non-prescription drugs, or dietary supplements you use. Also tell them if you smoke, drink alcohol, or use illegal drugs. Some items may interact with your medicine. What should I watch for while using this medicine? Your condition will be monitored carefully while you are receiving this medicine. You will need important blood work done while you are taking  this medicine. Do not become pregnant while taking this medicine or for at least 1 year after stopping it. Women of child-bearing potential will need to have a negative pregnancy test before starting this medicine. Women should inform their doctor if they wish to become pregnant or think they might be pregnant. There is a potential for serious side effects to an unborn child. Men should inform their doctors if they wish to father a child. This medicine may lower sperm counts. Talk to your health care professional or pharmacist for more information. Do not breast-feed an infant while taking this medicine or for 1 year after the last dose. What side effects may I notice from receiving this medicine? Side effects that you should report to your doctor or health care professional as soon as possible:  allergic reactions like skin rash, itching or hives, swelling of the face, lips, or tongue  feeling faint or lightheaded, falls  pain, tingling, numbness, or weakness in the legs  signs and symptoms of infection like fever or chills; cough; flu-like symptoms; sore throat  vaginal bleeding Side effects that usually do not require medical attention (report to your doctor or health care professional if they continue or are bothersome):  aches, pains  constipation  diarrhea  headache  hot flashes  nausea, vomiting  pain at site where injected  stomach pain This list may not describe all possible side effects. Call your doctor for medical advice about side effects. You may report side effects to FDA at 1-800-FDA-1088. Where should I keep my medicine? This drug is given in a hospital or clinic and will   not be stored at home. NOTE: This sheet is a summary. It may not cover all possible information. If you have questions about this medicine, talk to your doctor, pharmacist, or health care provider.  2021 Elsevier/Gold Standard (2017-12-10 11:34:41)  

## 2021-01-23 ENCOUNTER — Other Ambulatory Visit: Payer: Self-pay

## 2021-01-23 ENCOUNTER — Inpatient Hospital Stay: Payer: PPO | Attending: Oncology

## 2021-01-23 ENCOUNTER — Inpatient Hospital Stay: Payer: PPO

## 2021-01-23 ENCOUNTER — Other Ambulatory Visit: Payer: PPO

## 2021-01-23 VITALS — BP 90/61 | HR 106

## 2021-01-23 DIAGNOSIS — C50919 Malignant neoplasm of unspecified site of unspecified female breast: Secondary | ICD-10-CM

## 2021-01-23 DIAGNOSIS — Z5111 Encounter for antineoplastic chemotherapy: Secondary | ICD-10-CM | POA: Diagnosis not present

## 2021-01-23 DIAGNOSIS — C50412 Malignant neoplasm of upper-outer quadrant of left female breast: Secondary | ICD-10-CM

## 2021-01-23 DIAGNOSIS — Z95828 Presence of other vascular implants and grafts: Secondary | ICD-10-CM

## 2021-01-23 DIAGNOSIS — Z1502 Genetic susceptibility to malignant neoplasm of ovary: Secondary | ICD-10-CM

## 2021-01-23 DIAGNOSIS — Z79899 Other long term (current) drug therapy: Secondary | ICD-10-CM | POA: Insufficient documentation

## 2021-01-23 DIAGNOSIS — Z171 Estrogen receptor negative status [ER-]: Secondary | ICD-10-CM

## 2021-01-23 LAB — COMPREHENSIVE METABOLIC PANEL
ALT: 21 U/L (ref 0–44)
AST: 26 U/L (ref 15–41)
Albumin: 3.5 g/dL (ref 3.5–5.0)
Alkaline Phosphatase: 137 U/L — ABNORMAL HIGH (ref 38–126)
Anion gap: 12 (ref 5–15)
BUN: 17 mg/dL (ref 8–23)
CO2: 23 mmol/L (ref 22–32)
Calcium: 10.3 mg/dL (ref 8.9–10.3)
Chloride: 106 mmol/L (ref 98–111)
Creatinine, Ser: 1.1 mg/dL — ABNORMAL HIGH (ref 0.44–1.00)
GFR, Estimated: 54 mL/min — ABNORMAL LOW (ref >60)
Glucose, Bld: 107 mg/dL — ABNORMAL HIGH (ref 70–99)
Potassium: 4.1 mmol/L (ref 3.5–5.1)
Sodium: 141 mmol/L (ref 135–145)
Total Bilirubin: 1.5 mg/dL — ABNORMAL HIGH (ref 0.3–1.2)
Total Protein: 7.5 g/dL (ref 6.5–8.1)

## 2021-01-23 LAB — CBC WITH DIFFERENTIAL/PLATELET
Abs Immature Granulocytes: 0.03 10*3/uL (ref 0.00–0.07)
Basophils Absolute: 0 10*3/uL (ref 0.0–0.1)
Basophils Relative: 0 %
Eosinophils Absolute: 0 10*3/uL (ref 0.0–0.5)
Eosinophils Relative: 0 %
HCT: 36.9 % (ref 36.0–46.0)
Hemoglobin: 11.9 g/dL — ABNORMAL LOW (ref 12.0–15.0)
Immature Granulocytes: 0 %
Lymphocytes Relative: 15 %
Lymphs Abs: 1.4 10*3/uL (ref 0.7–4.0)
MCH: 29.2 pg (ref 26.0–34.0)
MCHC: 32.2 g/dL (ref 30.0–36.0)
MCV: 90.4 fL (ref 80.0–100.0)
Monocytes Absolute: 0.3 10*3/uL (ref 0.1–1.0)
Monocytes Relative: 4 %
Neutro Abs: 7.3 10*3/uL (ref 1.7–7.7)
Neutrophils Relative %: 81 %
Platelets: 300 10*3/uL (ref 150–400)
RBC: 4.08 MIL/uL (ref 3.87–5.11)
RDW: 13.1 % (ref 11.5–15.5)
WBC: 9.1 10*3/uL (ref 4.0–10.5)
nRBC: 0 % (ref 0.0–0.2)

## 2021-01-23 MED ORDER — HEPARIN SOD (PORK) LOCK FLUSH 100 UNIT/ML IV SOLN
500.0000 [IU] | Freq: Once | INTRAVENOUS | Status: AC
  Administered 2021-01-23: 500 [IU]
  Filled 2021-01-23: qty 5

## 2021-01-23 MED ORDER — FULVESTRANT 250 MG/5ML IM SOLN
500.0000 mg | INTRAMUSCULAR | Status: DC
  Administered 2021-01-23: 500 mg via INTRAMUSCULAR

## 2021-01-23 MED ORDER — SODIUM CHLORIDE 0.9% FLUSH
10.0000 mL | Freq: Once | INTRAVENOUS | Status: AC
  Administered 2021-01-23: 10 mL
  Filled 2021-01-23: qty 10

## 2021-01-23 MED ORDER — FULVESTRANT 250 MG/5ML IM SOLN
INTRAMUSCULAR | Status: AC
  Filled 2021-01-23: qty 10

## 2021-02-06 ENCOUNTER — Ambulatory Visit: Payer: PPO

## 2021-02-20 ENCOUNTER — Inpatient Hospital Stay: Payer: PPO

## 2021-02-20 ENCOUNTER — Inpatient Hospital Stay: Payer: PPO | Attending: Oncology

## 2021-02-20 ENCOUNTER — Other Ambulatory Visit: Payer: Self-pay

## 2021-02-20 ENCOUNTER — Other Ambulatory Visit: Payer: PPO

## 2021-02-20 VITALS — BP 97/67 | HR 93 | Temp 98.7°F | Resp 20

## 2021-02-20 DIAGNOSIS — Z79899 Other long term (current) drug therapy: Secondary | ICD-10-CM | POA: Insufficient documentation

## 2021-02-20 DIAGNOSIS — Z1589 Genetic susceptibility to other disease: Secondary | ICD-10-CM

## 2021-02-20 DIAGNOSIS — Z5111 Encounter for antineoplastic chemotherapy: Secondary | ICD-10-CM | POA: Diagnosis not present

## 2021-02-20 DIAGNOSIS — Z95828 Presence of other vascular implants and grafts: Secondary | ICD-10-CM

## 2021-02-20 DIAGNOSIS — C50919 Malignant neoplasm of unspecified site of unspecified female breast: Secondary | ICD-10-CM

## 2021-02-20 DIAGNOSIS — C50812 Malignant neoplasm of overlapping sites of left female breast: Secondary | ICD-10-CM | POA: Diagnosis not present

## 2021-02-20 DIAGNOSIS — C50412 Malignant neoplasm of upper-outer quadrant of left female breast: Secondary | ICD-10-CM

## 2021-02-20 LAB — COMPREHENSIVE METABOLIC PANEL
ALT: 17 U/L (ref 0–44)
AST: 13 U/L — ABNORMAL LOW (ref 15–41)
Albumin: 3.4 g/dL — ABNORMAL LOW (ref 3.5–5.0)
Alkaline Phosphatase: 114 U/L (ref 38–126)
Anion gap: 11 (ref 5–15)
BUN: 10 mg/dL (ref 8–23)
CO2: 23 mmol/L (ref 22–32)
Calcium: 10.1 mg/dL (ref 8.9–10.3)
Chloride: 108 mmol/L (ref 98–111)
Creatinine, Ser: 0.99 mg/dL (ref 0.44–1.00)
GFR, Estimated: >60 mL/min (ref >60)
Glucose, Bld: 99 mg/dL (ref 70–99)
Potassium: 3.7 mmol/L (ref 3.5–5.1)
Sodium: 142 mmol/L (ref 135–145)
Total Bilirubin: 0.9 mg/dL (ref 0.3–1.2)
Total Protein: 7 g/dL (ref 6.5–8.1)

## 2021-02-20 LAB — CBC WITH DIFFERENTIAL/PLATELET
Abs Immature Granulocytes: 0.02 10*3/uL (ref 0.00–0.07)
Basophils Absolute: 0 10*3/uL (ref 0.0–0.1)
Basophils Relative: 1 %
Eosinophils Absolute: 0.1 10*3/uL (ref 0.0–0.5)
Eosinophils Relative: 1 %
HCT: 33.1 % — ABNORMAL LOW (ref 36.0–46.0)
Hemoglobin: 10.8 g/dL — ABNORMAL LOW (ref 12.0–15.0)
Immature Granulocytes: 0 %
Lymphocytes Relative: 18 %
Lymphs Abs: 1.2 10*3/uL (ref 0.7–4.0)
MCH: 29.4 pg (ref 26.0–34.0)
MCHC: 32.6 g/dL (ref 30.0–36.0)
MCV: 90.2 fL (ref 80.0–100.0)
Monocytes Absolute: 0.5 10*3/uL (ref 0.1–1.0)
Monocytes Relative: 7 %
Neutro Abs: 4.8 10*3/uL (ref 1.7–7.7)
Neutrophils Relative %: 73 %
Platelets: 279 10*3/uL (ref 150–400)
RBC: 3.67 MIL/uL — ABNORMAL LOW (ref 3.87–5.11)
RDW: 13.6 % (ref 11.5–15.5)
WBC: 6.5 10*3/uL (ref 4.0–10.5)
nRBC: 0 % (ref 0.0–0.2)

## 2021-02-20 MED ORDER — HEPARIN SOD (PORK) LOCK FLUSH 100 UNIT/ML IV SOLN
500.0000 [IU] | Freq: Once | INTRAVENOUS | Status: AC
Start: 2021-02-20 — End: 2021-02-20
  Administered 2021-02-20: 500 [IU]
  Filled 2021-02-20: qty 5

## 2021-02-20 MED ORDER — FULVESTRANT 250 MG/5ML IM SOLN
INTRAMUSCULAR | Status: AC
  Filled 2021-02-20: qty 10

## 2021-02-20 MED ORDER — SODIUM CHLORIDE 0.9% FLUSH
10.0000 mL | Freq: Once | INTRAVENOUS | Status: AC
  Administered 2021-02-20: 10 mL
  Filled 2021-02-20: qty 10

## 2021-02-20 MED ORDER — FULVESTRANT 250 MG/5ML IM SOLN
500.0000 mg | INTRAMUSCULAR | Status: DC
  Administered 2021-02-20: 500 mg via INTRAMUSCULAR

## 2021-02-20 NOTE — Patient Instructions (Signed)
Fulvestrant injection What is this medicine? FULVESTRANT (ful VES trant) blocks the effects of estrogen. It is used to treat breast cancer. This medicine may be used for other purposes; ask your health care provider or pharmacist if you have questions. COMMON BRAND NAME(S): FASLODEX What should I tell my health care provider before I take this medicine? They need to know if you have any of these conditions:  bleeding disorders  liver disease  low blood counts, like low white cell, platelet, or red cell counts  an unusual or allergic reaction to fulvestrant, other medicines, foods, dyes, or preservatives  pregnant or trying to get pregnant  breast-feeding How should I use this medicine? This medicine is for injection into a muscle. It is usually given by a health care professional in a hospital or clinic setting. Talk to your pediatrician regarding the use of this medicine in children. Special care may be needed. Overdosage: If you think you have taken too much of this medicine contact a poison control center or emergency room at once. NOTE: This medicine is only for you. Do not share this medicine with others. What if I miss a dose? It is important not to miss your dose. Call your doctor or health care professional if you are unable to keep an appointment. What may interact with this medicine?  medicines that treat or prevent blood clots like warfarin, enoxaparin, dalteparin, apixaban, dabigatran, and rivaroxaban This list may not describe all possible interactions. Give your health care provider a list of all the medicines, herbs, non-prescription drugs, or dietary supplements you use. Also tell them if you smoke, drink alcohol, or use illegal drugs. Some items may interact with your medicine. What should I watch for while using this medicine? Your condition will be monitored carefully while you are receiving this medicine. You will need important blood work done while you are taking  this medicine. Do not become pregnant while taking this medicine or for at least 1 year after stopping it. Women of child-bearing potential will need to have a negative pregnancy test before starting this medicine. Women should inform their doctor if they wish to become pregnant or think they might be pregnant. There is a potential for serious side effects to an unborn child. Men should inform their doctors if they wish to father a child. This medicine may lower sperm counts. Talk to your health care professional or pharmacist for more information. Do not breast-feed an infant while taking this medicine or for 1 year after the last dose. What side effects may I notice from receiving this medicine? Side effects that you should report to your doctor or health care professional as soon as possible:  allergic reactions like skin rash, itching or hives, swelling of the face, lips, or tongue  feeling faint or lightheaded, falls  pain, tingling, numbness, or weakness in the legs  signs and symptoms of infection like fever or chills; cough; flu-like symptoms; sore throat  vaginal bleeding Side effects that usually do not require medical attention (report to your doctor or health care professional if they continue or are bothersome):  aches, pains  constipation  diarrhea  headache  hot flashes  nausea, vomiting  pain at site where injected  stomach pain This list may not describe all possible side effects. Call your doctor for medical advice about side effects. You may report side effects to FDA at 1-800-FDA-1088. Where should I keep my medicine? This drug is given in a hospital or clinic and will   not be stored at home. NOTE: This sheet is a summary. It may not cover all possible information. If you have questions about this medicine, talk to your doctor, pharmacist, or health care provider.  2021 Elsevier/Gold Standard (2017-12-10 11:34:41)  

## 2021-03-01 ENCOUNTER — Ambulatory Visit
Admission: RE | Admit: 2021-03-01 | Discharge: 2021-03-01 | Disposition: A | Discharge status: Home / Self Care | Payer: PPO | Source: Ambulatory Visit | Attending: Internal Medicine | Admitting: Internal Medicine

## 2021-03-01 ENCOUNTER — Other Ambulatory Visit: Payer: Self-pay | Admitting: Internal Medicine

## 2021-03-01 ENCOUNTER — Other Ambulatory Visit: Payer: Self-pay

## 2021-03-01 DIAGNOSIS — Z1231 Encounter for screening mammogram for malignant neoplasm of breast: Secondary | ICD-10-CM | POA: Diagnosis not present

## 2021-03-20 ENCOUNTER — Inpatient Hospital Stay: Payer: PPO

## 2021-03-20 ENCOUNTER — Inpatient Hospital Stay: Payer: PPO | Attending: Oncology

## 2021-03-20 ENCOUNTER — Other Ambulatory Visit: Payer: PPO

## 2021-03-20 ENCOUNTER — Other Ambulatory Visit: Payer: Self-pay

## 2021-03-20 VITALS — BP 116/70 | HR 83 | Temp 98.5°F | Resp 20

## 2021-03-20 DIAGNOSIS — Z1589 Genetic susceptibility to other disease: Secondary | ICD-10-CM

## 2021-03-20 DIAGNOSIS — C50919 Malignant neoplasm of unspecified site of unspecified female breast: Secondary | ICD-10-CM

## 2021-03-20 DIAGNOSIS — Z5111 Encounter for antineoplastic chemotherapy: Secondary | ICD-10-CM | POA: Diagnosis not present

## 2021-03-20 DIAGNOSIS — Z79899 Other long term (current) drug therapy: Secondary | ICD-10-CM | POA: Insufficient documentation

## 2021-03-20 DIAGNOSIS — C50812 Malignant neoplasm of overlapping sites of left female breast: Secondary | ICD-10-CM | POA: Diagnosis not present

## 2021-03-20 DIAGNOSIS — C50412 Malignant neoplasm of upper-outer quadrant of left female breast: Secondary | ICD-10-CM

## 2021-03-20 DIAGNOSIS — Z95828 Presence of other vascular implants and grafts: Secondary | ICD-10-CM

## 2021-03-20 DIAGNOSIS — Z1509 Genetic susceptibility to other malignant neoplasm: Secondary | ICD-10-CM

## 2021-03-20 LAB — COMPREHENSIVE METABOLIC PANEL
ALT: 21 U/L (ref 0–44)
AST: 28 U/L (ref 15–41)
Albumin: 3.5 g/dL (ref 3.5–5.0)
Alkaline Phosphatase: 124 U/L (ref 38–126)
Anion gap: 8 (ref 5–15)
BUN: 15 mg/dL (ref 8–23)
CO2: 27 mmol/L (ref 22–32)
Calcium: 9.7 mg/dL (ref 8.9–10.3)
Chloride: 105 mmol/L (ref 98–111)
Creatinine, Ser: 0.91 mg/dL (ref 0.44–1.00)
GFR, Estimated: >60 mL/min (ref >60)
Glucose, Bld: 110 mg/dL — ABNORMAL HIGH (ref 70–99)
Potassium: 4.4 mmol/L (ref 3.5–5.1)
Sodium: 140 mmol/L (ref 135–145)
Total Bilirubin: 1.1 mg/dL (ref 0.3–1.2)
Total Protein: 7.3 g/dL (ref 6.5–8.1)

## 2021-03-20 LAB — CBC WITH DIFFERENTIAL/PLATELET
Abs Immature Granulocytes: 0.02 10*3/uL (ref 0.00–0.07)
Basophils Absolute: 0 10*3/uL (ref 0.0–0.1)
Basophils Relative: 0 %
Eosinophils Absolute: 0.1 10*3/uL (ref 0.0–0.5)
Eosinophils Relative: 1 %
HCT: 35.4 % — ABNORMAL LOW (ref 36.0–46.0)
Hemoglobin: 11.7 g/dL — ABNORMAL LOW (ref 12.0–15.0)
Immature Granulocytes: 0 %
Lymphocytes Relative: 13 %
Lymphs Abs: 1 10*3/uL (ref 0.7–4.0)
MCH: 30.1 pg (ref 26.0–34.0)
MCHC: 33.1 g/dL (ref 30.0–36.0)
MCV: 91 fL (ref 80.0–100.0)
Monocytes Absolute: 0.4 10*3/uL (ref 0.1–1.0)
Monocytes Relative: 5 %
Neutro Abs: 6.6 10*3/uL (ref 1.7–7.7)
Neutrophils Relative %: 81 %
Platelets: 320 10*3/uL (ref 150–400)
RBC: 3.89 MIL/uL (ref 3.87–5.11)
RDW: 13.1 % (ref 11.5–15.5)
WBC: 8.1 10*3/uL (ref 4.0–10.5)
nRBC: 0 % (ref 0.0–0.2)

## 2021-03-20 MED ORDER — SODIUM CHLORIDE 0.9% FLUSH
10.0000 mL | Freq: Once | INTRAVENOUS | Status: AC
  Administered 2021-03-20: 10 mL
  Filled 2021-03-20: qty 10

## 2021-03-20 MED ORDER — HEPARIN SOD (PORK) LOCK FLUSH 100 UNIT/ML IV SOLN
500.0000 [IU] | Freq: Once | INTRAVENOUS | Status: AC
Start: 2021-03-20 — End: 2021-03-20
  Administered 2021-03-20: 500 [IU]
  Filled 2021-03-20: qty 5

## 2021-03-20 MED ORDER — FULVESTRANT 250 MG/5ML IM SOLN
INTRAMUSCULAR | Status: AC
  Filled 2021-03-20: qty 10

## 2021-03-20 MED ORDER — FULVESTRANT 250 MG/5ML IM SOLN
500.0000 mg | Freq: Once | INTRAMUSCULAR | Status: AC
  Administered 2021-03-20: 500 mg via INTRAMUSCULAR

## 2021-03-27 IMAGING — DX DG CHEST 2V
2 series · 2 of 2 positions shown · non-contrast
Comparison: Chest CT 02/08/2020 and earlier.

CLINICAL DATA: 69-year-old female with shortness of breath. History
of cancer, most recently left breast cancer diagnosed this year.

EXAM:
CHEST - 2 VIEW

[chest pa]
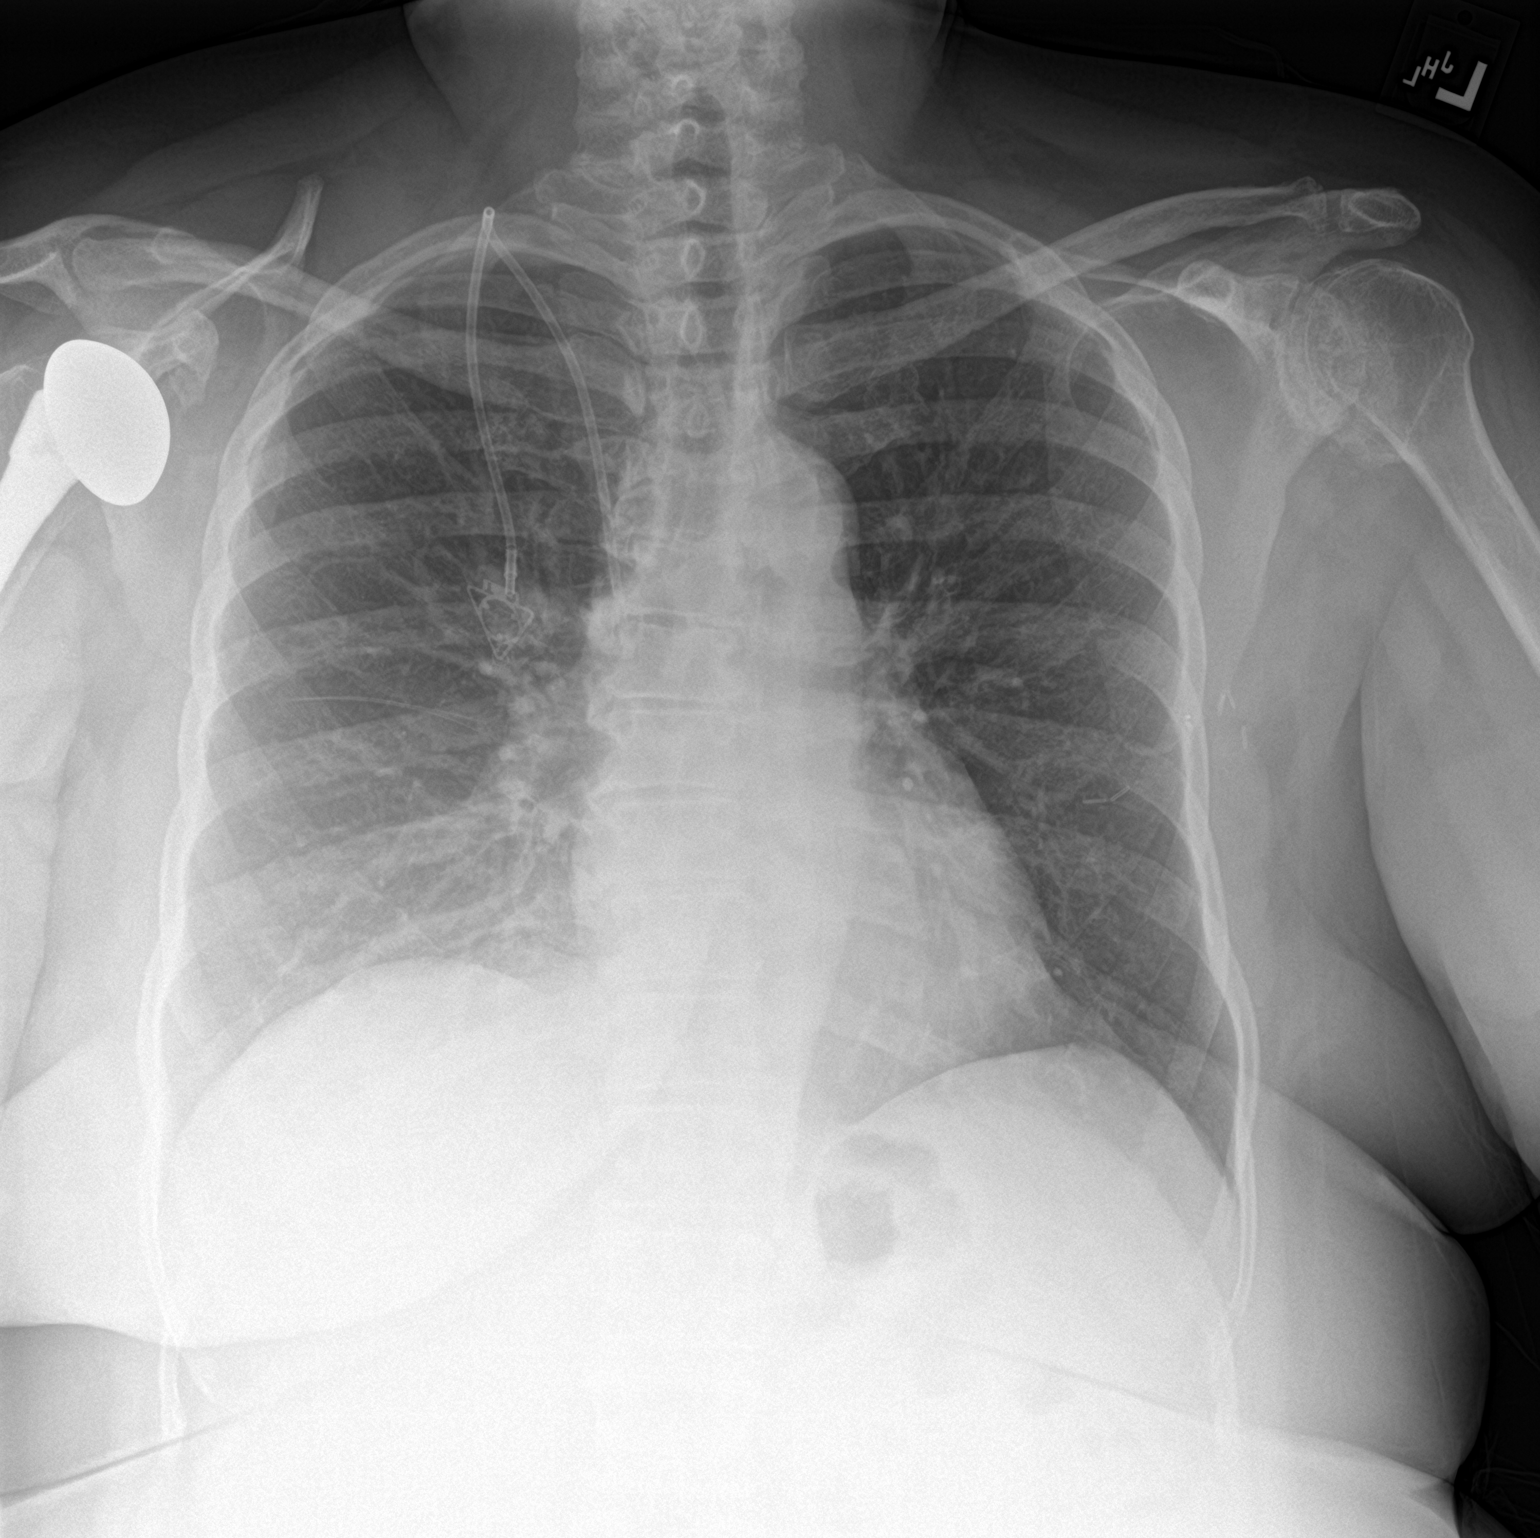

[chest lat]
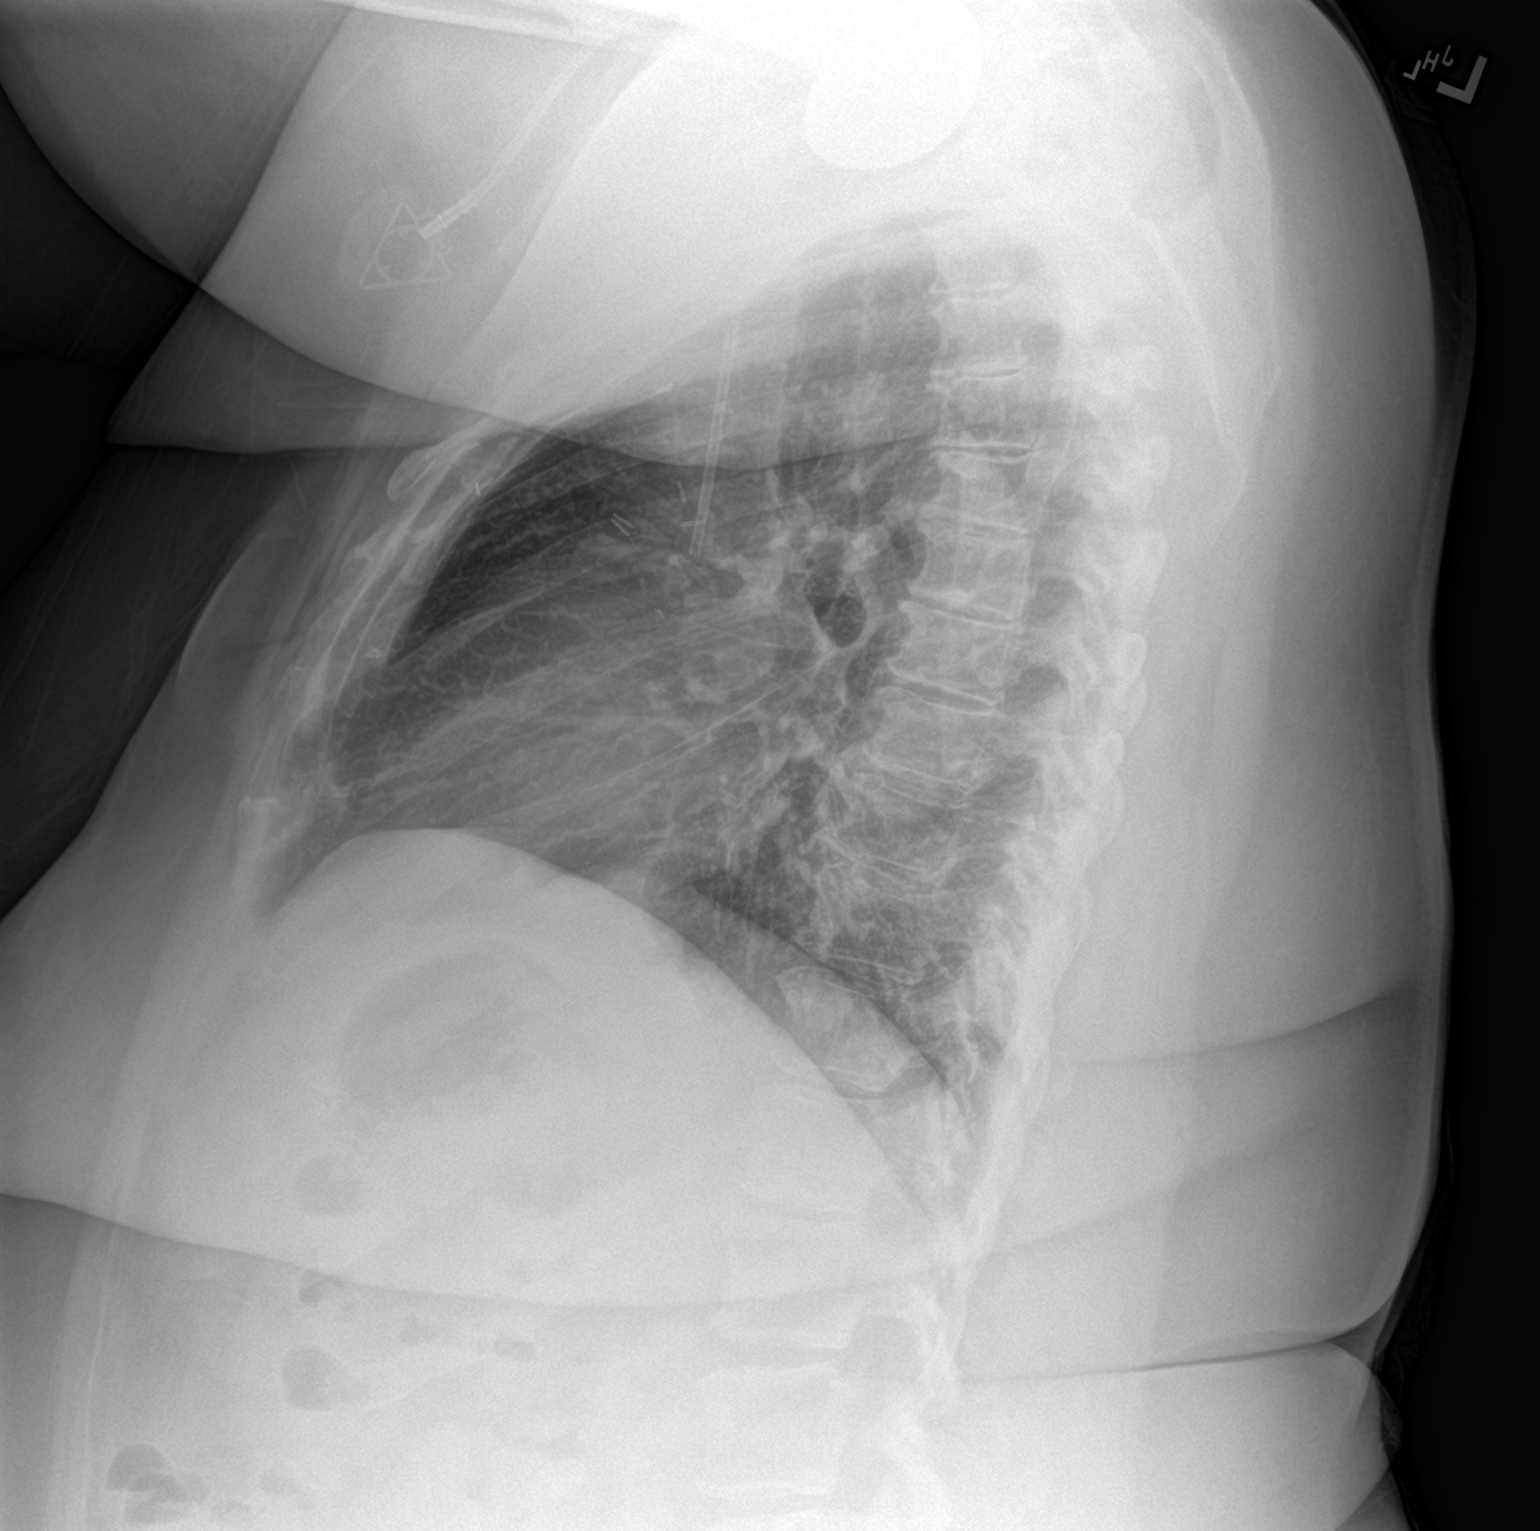

[2 of 2 positions shown; findings below may reference images not displayed]

FINDINGS: Right shoulder arthroplasty is new from 6449 radiographs. Right
chest power is new [REDACTED].

Lung volumes and mediastinal contours appear stable [REDACTED].
Suspect left mastectomy [REDACTED]. No pneumothorax, pulmonary edema,
pleural effusion or confluent pulmonary opacity. Visualized tracheal
air column is within normal limits. No acute osseous abnormality
identified. Negative visible bowel gas pattern.
IMPRESSION: No acute cardiopulmonary abnormality.  Left mastectomy since [DATE].

## 2021-03-28 ENCOUNTER — Other Ambulatory Visit: Payer: Self-pay

## 2021-03-28 ENCOUNTER — Ambulatory Visit
Admission: RE | Admit: 2021-03-28 | Discharge: 2021-03-28 | Disposition: A | Discharge status: Home / Self Care | Payer: PPO | Source: Ambulatory Visit | Attending: Oncology | Admitting: Oncology

## 2021-03-28 DIAGNOSIS — M85851 Other specified disorders of bone density and structure, right thigh: Secondary | ICD-10-CM | POA: Diagnosis not present

## 2021-03-28 DIAGNOSIS — Z171 Estrogen receptor negative status [ER-]: Secondary | ICD-10-CM

## 2021-03-28 DIAGNOSIS — C189 Malignant neoplasm of colon, unspecified: Secondary | ICD-10-CM

## 2021-03-28 DIAGNOSIS — Z780 Asymptomatic menopausal state: Secondary | ICD-10-CM | POA: Diagnosis not present

## 2021-03-28 DIAGNOSIS — C50412 Malignant neoplasm of upper-outer quadrant of left female breast: Secondary | ICD-10-CM

## 2021-03-29 ENCOUNTER — Telehealth: Payer: Self-pay

## 2021-03-29 NOTE — Telephone Encounter (Signed)
-----   Message from Gardenia Phlegm, NP sent at 03/29/2021 12:22 PM EDT ----- Patient has very mild osteopenia.  I recommend calcium, vitamin d and weight bearing exercises.  ----- Message ----- From: Interface, Rad Results In Sent: 03/29/2021  11:22 AM EDT To: Chauncey Cruel, MD

## 2021-03-29 NOTE — Telephone Encounter (Signed)
Patient notified, verbalized understanding and agreement.  No further needs at this time.

## 2021-04-10 DIAGNOSIS — H25813 Combined forms of age-related cataract, bilateral: Secondary | ICD-10-CM | POA: Diagnosis not present

## 2021-04-10 DIAGNOSIS — H401111 Primary open-angle glaucoma, right eye, mild stage: Secondary | ICD-10-CM | POA: Diagnosis not present

## 2021-04-10 DIAGNOSIS — H402223 Chronic angle-closure glaucoma, left eye, severe stage: Secondary | ICD-10-CM | POA: Diagnosis not present

## 2021-04-10 DIAGNOSIS — H43813 Vitreous degeneration, bilateral: Secondary | ICD-10-CM | POA: Diagnosis not present

## 2021-04-17 ENCOUNTER — Inpatient Hospital Stay: Payer: PPO

## 2021-04-17 ENCOUNTER — Other Ambulatory Visit: Payer: PPO

## 2021-04-17 ENCOUNTER — Other Ambulatory Visit: Payer: Self-pay

## 2021-04-17 ENCOUNTER — Inpatient Hospital Stay: Payer: PPO | Attending: Oncology

## 2021-04-17 VITALS — BP 125/77 | HR 111 | Temp 99.1°F | Resp 21

## 2021-04-17 DIAGNOSIS — C50812 Malignant neoplasm of overlapping sites of left female breast: Secondary | ICD-10-CM | POA: Diagnosis not present

## 2021-04-17 DIAGNOSIS — C50412 Malignant neoplasm of upper-outer quadrant of left female breast: Secondary | ICD-10-CM

## 2021-04-17 DIAGNOSIS — Z5111 Encounter for antineoplastic chemotherapy: Secondary | ICD-10-CM | POA: Insufficient documentation

## 2021-04-17 DIAGNOSIS — Z79899 Other long term (current) drug therapy: Secondary | ICD-10-CM | POA: Insufficient documentation

## 2021-04-17 DIAGNOSIS — Z95828 Presence of other vascular implants and grafts: Secondary | ICD-10-CM

## 2021-04-17 DIAGNOSIS — Z1509 Genetic susceptibility to other malignant neoplasm: Secondary | ICD-10-CM

## 2021-04-17 DIAGNOSIS — C50919 Malignant neoplasm of unspecified site of unspecified female breast: Secondary | ICD-10-CM

## 2021-04-17 LAB — COMPREHENSIVE METABOLIC PANEL
ALT: 13 U/L (ref 0–44)
AST: 11 U/L — ABNORMAL LOW (ref 15–41)
Albumin: 3.3 g/dL — ABNORMAL LOW (ref 3.5–5.0)
Alkaline Phosphatase: 102 U/L (ref 38–126)
Anion gap: 11 (ref 5–15)
BUN: 10 mg/dL (ref 8–23)
CO2: 23 mmol/L (ref 22–32)
Calcium: 10.3 mg/dL (ref 8.9–10.3)
Chloride: 108 mmol/L (ref 98–111)
Creatinine, Ser: 1.07 mg/dL — ABNORMAL HIGH (ref 0.44–1.00)
GFR, Estimated: 56 mL/min — ABNORMAL LOW (ref >60)
Glucose, Bld: 106 mg/dL — ABNORMAL HIGH (ref 70–99)
Potassium: 3.6 mmol/L (ref 3.5–5.1)
Sodium: 142 mmol/L (ref 135–145)
Total Bilirubin: 0.8 mg/dL (ref 0.3–1.2)
Total Protein: 7.1 g/dL (ref 6.5–8.1)

## 2021-04-17 LAB — CBC WITH DIFFERENTIAL/PLATELET
Abs Immature Granulocytes: 0.01 10*3/uL (ref 0.00–0.07)
Basophils Absolute: 0 10*3/uL (ref 0.0–0.1)
Basophils Relative: 0 %
Eosinophils Absolute: 0.1 10*3/uL (ref 0.0–0.5)
Eosinophils Relative: 1 %
HCT: 34.5 % — ABNORMAL LOW (ref 36.0–46.0)
Hemoglobin: 11.5 g/dL — ABNORMAL LOW (ref 12.0–15.0)
Immature Granulocytes: 0 %
Lymphocytes Relative: 17 %
Lymphs Abs: 1.1 10*3/uL (ref 0.7–4.0)
MCH: 29.8 pg (ref 26.0–34.0)
MCHC: 33.3 g/dL (ref 30.0–36.0)
MCV: 89.4 fL (ref 80.0–100.0)
Monocytes Absolute: 0.4 10*3/uL (ref 0.1–1.0)
Monocytes Relative: 6 %
Neutro Abs: 5 10*3/uL (ref 1.7–7.7)
Neutrophils Relative %: 76 %
Platelets: 275 10*3/uL (ref 150–400)
RBC: 3.86 MIL/uL — ABNORMAL LOW (ref 3.87–5.11)
RDW: 12.6 % (ref 11.5–15.5)
WBC: 6.5 10*3/uL (ref 4.0–10.5)
nRBC: 0 % (ref 0.0–0.2)

## 2021-04-17 MED ORDER — FULVESTRANT 250 MG/5ML IM SOLN
500.0000 mg | INTRAMUSCULAR | Status: DC
  Administered 2021-04-17: 500 mg via INTRAMUSCULAR

## 2021-04-17 MED ORDER — FULVESTRANT 250 MG/5ML IM SOLN
INTRAMUSCULAR | Status: AC
  Filled 2021-04-17: qty 10

## 2021-04-17 MED ORDER — SODIUM CHLORIDE 0.9% FLUSH
10.0000 mL | Freq: Once | INTRAVENOUS | Status: AC
  Administered 2021-04-17: 10 mL
  Filled 2021-04-17: qty 10

## 2021-04-17 MED ORDER — HEPARIN SOD (PORK) LOCK FLUSH 100 UNIT/ML IV SOLN
500.0000 [IU] | Freq: Once | INTRAVENOUS | Status: AC
  Administered 2021-04-17: 500 [IU]
  Filled 2021-04-17: qty 5

## 2021-04-17 NOTE — Patient Instructions (Signed)
Fulvestrant injection What is this medication? FULVESTRANT (ful VES trant) blocks the effects of estrogen. It is used to treat breast cancer. This medicine may be used for other purposes; ask your health care provider or pharmacist if you have questions. COMMON BRAND NAME(S): FASLODEX What should I tell my care team before I take this medication? They need to know if you have any of these conditions: bleeding disorders liver disease low blood counts, like low white cell, platelet, or red cell counts an unusual or allergic reaction to fulvestrant, other medicines, foods, dyes, or preservatives pregnant or trying to get pregnant breast-feeding How should I use this medication? This medicine is for injection into a muscle. It is usually given by a health care professional in a hospital or clinic setting. Talk to your pediatrician regarding the use of this medicine in children. Special care may be needed. Overdosage: If you think you have taken too much of this medicine contact a poison control center or emergency room at once. NOTE: This medicine is only for you. Do not share this medicine with others. What if I miss a dose? It is important not to miss your dose. Call your doctor or health care professional if you are unable to keep an appointment. What may interact with this medication? medicines that treat or prevent blood clots like warfarin, enoxaparin, dalteparin, apixaban, dabigatran, and rivaroxaban This list may not describe all possible interactions. Give your health care provider a list of all the medicines, herbs, non-prescription drugs, or dietary supplements you use. Also tell them if you smoke, drink alcohol, or use illegal drugs. Some items may interact with your medicine. What should I watch for while using this medication? Your condition will be monitored carefully while you are receiving this medicine. You will need important blood work done while you are taking this  medicine. Do not become pregnant while taking this medicine or for at least 1 year after stopping it. Women of child-bearing potential will need to have a negative pregnancy test before starting this medicine. Women should inform their doctor if they wish to become pregnant or think they might be pregnant. There is a potential for serious side effects to an unborn child. Men should inform their doctors if they wish to father a child. This medicine may lower sperm counts. Talk to your health care professional or pharmacist for more information. Do not breast-feed an infant while taking this medicine or for 1 year after the last dose. What side effects may I notice from receiving this medication? Side effects that you should report to your doctor or health care professional as soon as possible: allergic reactions like skin rash, itching or hives, swelling of the face, lips, or tongue feeling faint or lightheaded, falls pain, tingling, numbness, or weakness in the legs signs and symptoms of infection like fever or chills; cough; flu-like symptoms; sore throat vaginal bleeding Side effects that usually do not require medical attention (report to your doctor or health care professional if they continue or are bothersome): aches, pains constipation diarrhea headache hot flashes nausea, vomiting pain at site where injected stomach pain This list may not describe all possible side effects. Call your doctor for medical advice about side effects. You may report side effects to FDA at 1-800-FDA-1088. Where should I keep my medication? This drug is given in a hospital or clinic and will not be stored at home. NOTE: This sheet is a summary. It may not cover all possible information. If you have   questions about this medicine, talk to your doctor, pharmacist, or health care provider.  2022 Elsevier/Gold Standard (2017-12-10 11:34:41)  

## 2021-05-15 ENCOUNTER — Encounter: Payer: Self-pay | Admitting: Oncology

## 2021-05-15 ENCOUNTER — Inpatient Hospital Stay: Payer: PPO

## 2021-05-15 ENCOUNTER — Other Ambulatory Visit: Payer: Self-pay

## 2021-05-15 ENCOUNTER — Other Ambulatory Visit: Payer: PPO

## 2021-05-15 ENCOUNTER — Encounter: Payer: Self-pay | Admitting: Licensed Clinical Social Worker

## 2021-05-15 DIAGNOSIS — Z171 Estrogen receptor negative status [ER-]: Secondary | ICD-10-CM

## 2021-05-15 DIAGNOSIS — Z5111 Encounter for antineoplastic chemotherapy: Secondary | ICD-10-CM | POA: Diagnosis not present

## 2021-05-15 DIAGNOSIS — C50919 Malignant neoplasm of unspecified site of unspecified female breast: Secondary | ICD-10-CM

## 2021-05-15 DIAGNOSIS — Z95828 Presence of other vascular implants and grafts: Secondary | ICD-10-CM

## 2021-05-15 DIAGNOSIS — Z1589 Genetic susceptibility to other disease: Secondary | ICD-10-CM

## 2021-05-15 DIAGNOSIS — C50412 Malignant neoplasm of upper-outer quadrant of left female breast: Secondary | ICD-10-CM

## 2021-05-15 LAB — CBC WITH DIFFERENTIAL/PLATELET
Abs Immature Granulocytes: 0.01 10*3/uL (ref 0.00–0.07)
Basophils Absolute: 0 10*3/uL (ref 0.0–0.1)
Basophils Relative: 0 %
Eosinophils Absolute: 0.1 10*3/uL (ref 0.0–0.5)
Eosinophils Relative: 2 %
HCT: 33.6 % — ABNORMAL LOW (ref 36.0–46.0)
Hemoglobin: 11.1 g/dL — ABNORMAL LOW (ref 12.0–15.0)
Immature Granulocytes: 0 %
Lymphocytes Relative: 25 %
Lymphs Abs: 1.3 10*3/uL (ref 0.7–4.0)
MCH: 29.7 pg (ref 26.0–34.0)
MCHC: 33 g/dL (ref 30.0–36.0)
MCV: 89.8 fL (ref 80.0–100.0)
Monocytes Absolute: 0.5 10*3/uL (ref 0.1–1.0)
Monocytes Relative: 8 %
Neutro Abs: 3.4 10*3/uL (ref 1.7–7.7)
Neutrophils Relative %: 65 %
Platelets: 300 10*3/uL (ref 150–400)
RBC: 3.74 MIL/uL — ABNORMAL LOW (ref 3.87–5.11)
RDW: 12.9 % (ref 11.5–15.5)
WBC: 5.4 10*3/uL (ref 4.0–10.5)
nRBC: 0 % (ref 0.0–0.2)

## 2021-05-15 LAB — COMPREHENSIVE METABOLIC PANEL
ALT: 11 U/L (ref 0–44)
AST: 10 U/L — ABNORMAL LOW (ref 15–41)
Albumin: 3.4 g/dL — ABNORMAL LOW (ref 3.5–5.0)
Alkaline Phosphatase: 108 U/L (ref 38–126)
Anion gap: 10 (ref 5–15)
BUN: 7 mg/dL — ABNORMAL LOW (ref 8–23)
CO2: 25 mmol/L (ref 22–32)
Calcium: 10.2 mg/dL (ref 8.9–10.3)
Chloride: 107 mmol/L (ref 98–111)
Creatinine, Ser: 0.85 mg/dL (ref 0.44–1.00)
GFR, Estimated: >60 mL/min (ref >60)
Glucose, Bld: 108 mg/dL — ABNORMAL HIGH (ref 70–99)
Potassium: 3.5 mmol/L (ref 3.5–5.1)
Sodium: 142 mmol/L (ref 135–145)
Total Bilirubin: 0.8 mg/dL (ref 0.3–1.2)
Total Protein: 7.2 g/dL (ref 6.5–8.1)

## 2021-05-15 MED ORDER — SODIUM CHLORIDE 0.9% FLUSH
10.0000 mL | Freq: Once | INTRAVENOUS | Status: AC
  Administered 2021-05-15: 10 mL

## 2021-05-15 MED ORDER — HEPARIN SOD (PORK) LOCK FLUSH 100 UNIT/ML IV SOLN
500.0000 [IU] | Freq: Once | INTRAVENOUS | Status: AC
  Administered 2021-05-15: 500 [IU]

## 2021-05-15 MED ORDER — FULVESTRANT 250 MG/5ML IM SOLN
500.0000 mg | INTRAMUSCULAR | Status: DC
  Administered 2021-05-15: 500 mg via INTRAMUSCULAR
  Filled 2021-05-15: qty 10

## 2021-05-15 NOTE — Progress Notes (Signed)
[place] Work  Clinical Social Work was referred by [employer] for assistance with [employer]. Pt has utilized all of her [employer].  Clinical Social Worker contacted patient by phone  to offer support and assess for needs.  Discussed potential local resource assistance through [employer] and DSS energy assistance program and gave contact information to patient.  Also assisted in completing an application for [employer] and submitted with patient's permission. No other needs at this time. CSW provided direct contact information and encouraged pt to call with any other questions.    [place], [place] Worker [employer]

## 2021-05-15 NOTE — Progress Notes (Signed)
Patient stopped by to inquire about financial assistance with personal bills.  Reviewed notes and patient received the one-time [employer] grant in the past and had used all of her funds. Advised her and she verbalized understanding.  Staff message sent to Sharyn Lull in social work for further review of any additional resources.

## 2021-05-16 ENCOUNTER — Encounter: Payer: Self-pay | Admitting: Licensed Clinical Social Worker

## 2021-06-11 ENCOUNTER — Other Ambulatory Visit (HOSPITAL_COMMUNITY): Payer: Self-pay

## 2021-06-11 MED FILL — Lisinopril & Hydrochlorothiazide Tab 20-12.5 MG: ORAL | 90 days supply | Qty: 45 | Fill #0 | Status: AC

## 2021-06-12 ENCOUNTER — Inpatient Hospital Stay (HOSPITAL_BASED_OUTPATIENT_CLINIC_OR_DEPARTMENT_OTHER): Payer: PPO | Admitting: Oncology

## 2021-06-12 ENCOUNTER — Inpatient Hospital Stay: Payer: PPO

## 2021-06-12 ENCOUNTER — Inpatient Hospital Stay: Payer: PPO | Attending: Oncology

## 2021-06-12 ENCOUNTER — Other Ambulatory Visit: Payer: PPO

## 2021-06-12 ENCOUNTER — Other Ambulatory Visit: Payer: Self-pay

## 2021-06-12 VITALS — BP 124/82 | HR 101 | Temp 97.7°F | Resp 18 | Ht 61.0 in | Wt 237.7 lb

## 2021-06-12 DIAGNOSIS — Z90710 Acquired absence of both cervix and uterus: Secondary | ICD-10-CM | POA: Diagnosis not present

## 2021-06-12 DIAGNOSIS — Z1509 Genetic susceptibility to other malignant neoplasm: Secondary | ICD-10-CM

## 2021-06-12 DIAGNOSIS — C50412 Malignant neoplasm of upper-outer quadrant of left female breast: Secondary | ICD-10-CM

## 2021-06-12 DIAGNOSIS — Z923 Personal history of irradiation: Secondary | ICD-10-CM | POA: Insufficient documentation

## 2021-06-12 DIAGNOSIS — C50812 Malignant neoplasm of overlapping sites of left female breast: Secondary | ICD-10-CM | POA: Insufficient documentation

## 2021-06-12 DIAGNOSIS — Z85038 Personal history of other malignant neoplasm of large intestine: Secondary | ICD-10-CM | POA: Diagnosis not present

## 2021-06-12 DIAGNOSIS — C50919 Malignant neoplasm of unspecified site of unspecified female breast: Secondary | ICD-10-CM

## 2021-06-12 DIAGNOSIS — Z1502 Genetic susceptibility to malignant neoplasm of ovary: Secondary | ICD-10-CM | POA: Diagnosis not present

## 2021-06-12 DIAGNOSIS — Z171 Estrogen receptor negative status [ER-]: Secondary | ICD-10-CM | POA: Diagnosis not present

## 2021-06-12 DIAGNOSIS — Z1589 Genetic susceptibility to other disease: Secondary | ICD-10-CM | POA: Diagnosis not present

## 2021-06-12 DIAGNOSIS — C50912 Malignant neoplasm of unspecified site of left female breast: Secondary | ICD-10-CM | POA: Diagnosis not present

## 2021-06-12 DIAGNOSIS — Z9049 Acquired absence of other specified parts of digestive tract: Secondary | ICD-10-CM | POA: Insufficient documentation

## 2021-06-12 DIAGNOSIS — Z79899 Other long term (current) drug therapy: Secondary | ICD-10-CM | POA: Insufficient documentation

## 2021-06-12 DIAGNOSIS — Z9012 Acquired absence of left breast and nipple: Secondary | ICD-10-CM | POA: Diagnosis not present

## 2021-06-12 DIAGNOSIS — Z5111 Encounter for antineoplastic chemotherapy: Secondary | ICD-10-CM | POA: Diagnosis not present

## 2021-06-12 DIAGNOSIS — Z803 Family history of malignant neoplasm of breast: Secondary | ICD-10-CM | POA: Diagnosis not present

## 2021-06-12 DIAGNOSIS — Z90722 Acquired absence of ovaries, bilateral: Secondary | ICD-10-CM | POA: Insufficient documentation

## 2021-06-12 DIAGNOSIS — Z9079 Acquired absence of other genital organ(s): Secondary | ICD-10-CM | POA: Diagnosis not present

## 2021-06-12 DIAGNOSIS — Z95828 Presence of other vascular implants and grafts: Secondary | ICD-10-CM

## 2021-06-12 LAB — COMPREHENSIVE METABOLIC PANEL
ALT: 13 U/L (ref 0–44)
AST: 14 U/L — ABNORMAL LOW (ref 15–41)
Albumin: 3.3 g/dL — ABNORMAL LOW (ref 3.5–5.0)
Alkaline Phosphatase: 104 U/L (ref 38–126)
Anion gap: 12 (ref 5–15)
BUN: 10 mg/dL (ref 8–23)
CO2: 24 mmol/L (ref 22–32)
Calcium: 10 mg/dL (ref 8.9–10.3)
Chloride: 107 mmol/L (ref 98–111)
Creatinine, Ser: 0.84 mg/dL (ref 0.44–1.00)
GFR, Estimated: >60 mL/min (ref >60)
Glucose, Bld: 95 mg/dL (ref 70–99)
Potassium: 3.5 mmol/L (ref 3.5–5.1)
Sodium: 143 mmol/L (ref 135–145)
Total Bilirubin: 1.1 mg/dL (ref 0.3–1.2)
Total Protein: 6.8 g/dL (ref 6.5–8.1)

## 2021-06-12 LAB — CBC WITH DIFFERENTIAL/PLATELET
Abs Immature Granulocytes: 0.02 10*3/uL (ref 0.00–0.07)
Basophils Absolute: 0 10*3/uL (ref 0.0–0.1)
Basophils Relative: 0 %
Eosinophils Absolute: 0.2 10*3/uL (ref 0.0–0.5)
Eosinophils Relative: 3 %
HCT: 31.4 % — ABNORMAL LOW (ref 36.0–46.0)
Hemoglobin: 10.5 g/dL — ABNORMAL LOW (ref 12.0–15.0)
Immature Granulocytes: 0 %
Lymphocytes Relative: 27 %
Lymphs Abs: 1.4 10*3/uL (ref 0.7–4.0)
MCH: 29.7 pg (ref 26.0–34.0)
MCHC: 33.4 g/dL (ref 30.0–36.0)
MCV: 89 fL (ref 80.0–100.0)
Monocytes Absolute: 0.4 10*3/uL (ref 0.1–1.0)
Monocytes Relative: 8 %
Neutro Abs: 3 10*3/uL (ref 1.7–7.7)
Neutrophils Relative %: 62 %
Platelets: 294 10*3/uL (ref 150–400)
RBC: 3.53 MIL/uL — ABNORMAL LOW (ref 3.87–5.11)
RDW: 13.1 % (ref 11.5–15.5)
WBC: 4.9 10*3/uL (ref 4.0–10.5)
nRBC: 0 % (ref 0.0–0.2)

## 2021-06-12 MED ORDER — SODIUM CHLORIDE 0.9% FLUSH
10.0000 mL | Freq: Once | INTRAVENOUS | Status: AC
  Administered 2021-06-12: 10 mL

## 2021-06-12 MED ORDER — HEPARIN SOD (PORK) LOCK FLUSH 100 UNIT/ML IV SOLN
500.0000 [IU] | Freq: Once | INTRAVENOUS | Status: AC
  Administered 2021-06-12: 500 [IU]

## 2021-06-12 NOTE — Progress Notes (Signed)
[facility]  Telephone:([phone]) [phone] Fax:([phone]) [phone]    ID: [name] DOB: 03/12/1951  MR#: [id number]  KYH#:[id number]  Patient Care Team: Minette Brine, [place] as PCP - General (General Practice) Jolene Guyett, Virgie Dad, MD as Consulting Physician (Oncology) Rolm Bookbinder, MD as Consulting Physician (General Surgery) Gery Pray, MD as Consulting Physician (Radiation Oncology) [name], MD as Consulting Physician (Ophthalmology)   CHIEF COMPLAINT: triple negative breast cancer; PALB2 positive (s/p left mastectomy)  CURRENT TREATMENT: fulvestrant   INTERVAL HISTORY: [name] returns today for follow up and treatment of her recurrent left breast cancer.  She continues on fulvestrant.  She tolerates this with no side effects that she is aware of.  She does not have any significant discomfort from the actual administration of the drug.  Since her last visit, she underwent right screening mammography with tomography at [facility] on 03/01/2021 showing: breast density category B; no evidence of malignancy.  She also underwent bone density screening on 03/28/2021 showing a T-score of  -1.2, which is minimally osteopenic.   REVIEW OF SYSTEMS: [name] tells me she "feels great".  She has no pain.  Mostly she sits around reading and also spends time with her 13 year old mother.  She uses a cane on the right more for stability than for any other reason.  She denies falls however.  A detailed review of systems today was otherwise stable   COVID 19 VACCINATION STATUS: fully vaccinated [name]), with booster 11/2020   BREAST CANCER HISTORY: From the original intake note:  [name] herself palpated a mass in her left breast, and immediately brought it to the attention of her primary care physician, Dr. Baird Cancer, who confirmed the finding and setup the patient for bilateral diagnostic mammography at the [facility] Center 02/20/2014. This showed a high density mass in  the outer left breast measuring up to 3.7 cm. It corresponded to the site of palpable concern. In addition the normal 4 logically abnormal lymph nodes in the left axilla. The mass was palpable by exam, and by ultrasonography measured 2.6 cm. There were enlarged and morphologically abnormal lymph nodes in the left axilla, largest measuring 3 cm.  Biopsy of the left breast mass in question and 1 of the abnormal axillary lymph nodes 02/22/2014 showed ([id number]) biopsies to be positive for an invasive ductal carcinoma, grade 3, triple negative, with an MIB-1 of 90%. Specifically the HER-2 signals ratio was 1.05, and the number per cell 2.00.  MRI of the breasts 03/01/2014 showed a 3.8 cm enhancing mass with areas of apparent necrosis in the left breast, as well as the biopsy tract extending laterally, the combined measure 5.3 cm. There were no other masses or areas of abnormal enhancement. There were multiple abnormally enlarged left axillary lymph nodes with loss of the normal fatty hilum. These included level I and retropectoral lymph nodes.  The patient's subsequent history is as detailed below   PAST MEDICAL HISTORY: Past Medical History:  Diagnosis Date   Colon cancer ([place])    Hx of rotator cuff surgery    Hypertension    left breast cancer    Personal history of chemotherapy 2016   left   Personal history of radiation therapy 2016   left breast    Radiation 11/15/14-01/02/15   left breast, axillary and supraclavicular region 45 gray, lumpectomy cavity boosted to 16 gray    PAST SURGICAL HISTORY: Past Surgical History:  Procedure Laterality Date   [place]  EXCISIONAL BIOPSY Right 12/21/2017   BREAST LUMPECTOMY Left 2016   COLON SURGERY  1988   colectomy/colostomy-ca   COLON SURGERY  1988   colostomy takedown-reversal   COLONOSCOPY     EXCISION / BIOPSY BREAST / NIPPLE / DUCT Left 09/20/2014   IR IMAGING GUIDED PORT INSERTION  03/22/2020   MASTECTOMY  Left 02/14/2020   MASTECTOMY W/ SENTINEL NODE BIOPSY Left 02/14/2020   Procedure: LEFT MASTECTOMY WITH BLUE DYE INJECTION;  Surgeon: Rolm Bookbinder, MD;  Location: [facility];  Service: General;  Laterality: Left;  PEC BLOCK   RADIOACTIVE SEED GUIDED EXCISIONAL BREAST BIOPSY Right 12/21/2017   Procedure: RIGHT RADIOACTIVE SEED GUIDED EXCISIONAL BREAST BIOPSY ERAS PATHWAY;  Surgeon: Rolm Bookbinder, MD;  Location: [facility];  Service: General;  Laterality: Right;   RADIOACTIVE SEED GUIDED PARTIAL MASTECTOMY WITH AXILLARY SENTINEL LYMPH NODE BIOPSY Left 09/20/2014   Procedure: RADIOACTIVE SEED GUIDED LEFT BREAST LUMPECTOMY WITH AXILLARY SENTINEL LYMPH NODE BIOPSY;  Surgeon: Rolm Bookbinder, MD;  Location: [facility];  Service: General;  Laterality: Left;   TOTAL SHOULDER ARTHROPLASTY  2009   right    FAMILY HISTORY Family History  Problem Relation Age of Onset   Cancer Mother 53       breast   Breast cancer Mother        early 29s   Stroke Father 48       cause of death   Diabetes Father    Cancer Maternal Aunt        breast cancer at unknown age   Breast cancer Maternal Aunt    Ovarian cancer Neg Hx    the patient's father died at the age of 36 following a stroke in the setting of diabetes; the patient's mother is living at 74. The patient has 3 brothers and 2 sisters. [name], [name], was diagnosed with breast cancer in her early 55s. There is no other breast or ovarian cancer in the family. The patient herself was diagnosed with watts by history he is an incidentally found stage I colon carcinoma noted at the time of her hysterectomy. She had a partial colectomy but no adjuvant treatment. We have no records regarding that procedure   GYNECOLOGIC HISTORY:  No LMP recorded. Patient has had a hysterectomy. Menarche age 84, first live birth age 5, the patient underwent total abdominal hysterectomy with bilateral salpingo-oophorectomy in her  70s. She did not take hormone replacement. She did not take oral contraceptives at any point.   SOCIAL HISTORY: (Updated May 2021) Calyn worked for [employer] for about 40 years, retiring in 2008. She is divorced and lives alone, with no pets.  Her grandson who is a Conservator, museum/gallery frequently stays with her.  Her daughter Shalamar Plourde works for [employer] in Press photographer. The second daughter, Dayrin Stallone, works as a Scientist, water quality at [employer].  The patient has 6 grandchildren and three great-grandchildren. She attends a [employer].     ADVANCED DIRECTIVES: Not in place. The patient intends to name her daughter [name]. [name] is work number is [phone]. On the patient's 03/01/2014 visit she was given a copy of the appropriate documents to complete and notarize at her discretion.     HEALTH MAINTENANCE: Social History   Tobacco Use   Smoking status: Never   Smokeless tobacco: Never  Vaping Use   Vaping Use: Never used  Substance Use Topics   Alcohol use: Not Currently  Comment: occ-rare   Drug use: No     Colonoscopy: 2000  PAP: May 2015  Bone density: 12/21/2017, T-score of -0.6  Lipid panel: 02/03/2014  Not on File  Current Outpatient Medications  Medication Sig Dispense Refill   acetaminophen (TYLENOL) 500 MG tablet Take 1,000 mg by mouth every 6 (six) hours as needed for mild pain or moderate pain.     gabapentin (NEURONTIN) 300 MG capsule Take 1 capsule (300 mg total) by mouth at bedtime. 90 capsule 4   hydroxypropyl methylcellulose / hypromellose (ISOPTO TEARS / GONIOVISC) 2.5 % ophthalmic solution Place 1 drop into both eyes daily as needed for dry eyes.     Liniments (SALONPAS ARTHRITIS PAIN RELIEF EX) Apply 1 application topically daily as needed (knee pain).     lisinopril-hydrochlorothiazide (ZESTORETIC) 20-12.5 MG tablet TAKE 1/2 TABLET BY MOUTH DAILY 45 tablet 5   LUMIGAN 0.01 % SOLN      Multiple  Vitamins-Minerals (AIRBORNE PO) Take 1 tablet by mouth 2 (two) times a week.     ursodiol (ACTIGALL) 500 MG tablet Take 500 mg by mouth 3 (three) times daily.      No current facility-administered medications for this visit.    OBJECTIVE: African-American woman who appears older than stated age 13:   06/12/21 1427  BP: 124/82  Pulse: (!) 101  Resp: 18  Temp: 97.7 F (36.5 C)  SpO2: 100%    Filed Weights   06/12/21 1427  Weight: 237 lb 11.2 oz (107.8 kg)    Body mass index is 44.91 kg/m.     ECOG FS:1 - Symptomatic but completely ambulatory  Sclerae unicteric, EOMs intact Wearing a mask No cervical or supraclavicular adenopathy Lungs no rales or rhonchi Heart regular rate and rhythm Abd soft, obese, nontender, positive bowel sounds MSK no focal spinal tenderness, no upper extremity lymphedema Neuro: nonfocal, well oriented, appropriate affect Breasts: The right breast is benign.  The left breast is status post mastectomy with no evidence of chest wall recurrence.  Both axillae are benign.   LAB RESULTS:  No results found for: SPEP  Lab Results  Component Value Date   WBC 4.9 06/12/2021   NEUTROABS 3.0 06/12/2021   HGB 10.5 (L) 06/12/2021   HCT 31.4 (L) 06/12/2021   MCV 89.0 06/12/2021   PLT 294 06/12/2021      Chemistry      Component Value Date/Time   NA 143 06/12/2021 1353   NA 143 12/07/2019 1742   NA 142 11/13/2016 1050   K 3.5 06/12/2021 1353   K 4.1 11/13/2016 1050   CL 107 06/12/2021 1353   CO2 24 06/12/2021 1353   CO2 27 11/13/2016 1050   BUN 10 06/12/2021 1353   BUN 13 12/07/2019 1742   BUN 11.5 11/13/2016 1050   CREATININE 0.84 06/12/2021 1353   CREATININE 0.86 01/30/2020 0946   CREATININE 1.0 11/13/2016 1050      Component Value Date/Time   CALCIUM 10.0 06/12/2021 1353   CALCIUM 9.8 11/13/2016 1050   ALKPHOS 104 06/12/2021 1353   ALKPHOS 140 11/13/2016 1050   AST 14 (L) 06/12/2021 1353   AST 12 (L) 01/30/2020 0946   AST 13  11/13/2016 1050   ALT 13 06/12/2021 1353   ALT 14 01/30/2020 0946   ALT 15 11/13/2016 1050   BILITOT 1.1 06/12/2021 1353   BILITOT 1.3 (H) 01/30/2020 0946   BILITOT 0.86 11/13/2016 1050       No results found for: LABCA2  No  components found for: INOMV672  No results for input(s): INR in the last 168 hours.  Urinalysis    Component Value Date/Time   COLORURINE YELLOW 04/05/2008 1120   APPEARANCEUR CLEAR 04/05/2008 1120   LABSPEC 1.021 04/05/2008 1120   PHURINE 6.0 04/05/2008 1120   GLUCOSEU NEGATIVE 04/05/2008 1120   HGBUR NEGATIVE 04/05/2008 1120   BILIRUBINUR negative 12/12/2020 1236   KETONESUR NEGATIVE 04/05/2008 1120   PROTEINUR Negative 12/12/2020 1236   PROTEINUR NEGATIVE 04/05/2008 1120   UROBILINOGEN 0.2 12/12/2020 1236   UROBILINOGEN 0.2 04/05/2008 1120   NITRITE negative 12/12/2020 1236   NITRITE NEGATIVE 04/05/2008 1120   LEUKOCYTESUR Negative 12/12/2020 1236    STUDIES: No results found.     ASSESSMENT: 70 y.o. BRCA negative [place] woman s/p left breast upper outer quadrant and left axillary lymph node biopsy 02/22/2014, both positive for a clinical T2 N2, stage IIIA invasive ductal carcinoma, grade 3, triple negative, with an MIB-1 of 90%  (1) neoadjuvant chemotherapy started 03/13/2014, with cyclophosphamide and doxorubicin in dose dense fashion x4, with Neulasta support on day 2, completed 04/24/2014, followed by weekly carboplatin and paclitaxel x12, paclitaxel reduced during cycle 5 and cycle 7 because of elevated bilirubin levels  (2) left lumpectomy and sentinel lymph node sampling 09/20/2014 showed a complete pathologic response.  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  (b) PALB2 mutations are known to increase the risk of breast and pancreatic cancer, possibly other cancers, but the data is preliminary  (c) the patient's 2 daughters have been tested and did not carry the gene  (d) [name]'s MSH2 VUS has been reclassified as "likely benign"  (e) she is status post remote TAH/BSO  (f) intensified screening, with mammography in February and breast MRI in August planned  (6) staging studies showed right-sided lung nodules, too small to characterize, and a dominant right-sided thyroid nodule unchanged in size as compared to 2011; to be followed  (a) chest CT 04/24/2016 showed the small pulmonary nodules to be completely stable, consistent with benign findings.  (b) thyroid ultrasound--done on 07/22/2018 showed enlarged heterogeneous lobular and multinodular thyroid gland, TI-RADS category 3 nodules, not meeting criteria for further biopsy or dedicated imaging f/u.    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  (8) anastrozole started 07/08/2018 for breast cancer prevention, discontinued 01/30/2020 with disease recurrence  (a) bone density scan 01/01/2018 normal, T score -0.6  (9) RECURRENT DISEASE May 2021  (a) left breast overlapping biopsy 01/20/2020 shows a clinical  T2 N0, stage IIB invasive ductal carcinoma, grade 2 or 3, with moderate estrogen receptor positivity, progesterone receptor negative, HER-2 not amplified, MIB-1 of 80%  (b) left mastectomy on 02/14/2020 showed a pT2 pNX, invasive ductal carcinoma, grade 3, with negative margins   (c) CT of the chest and bone scan on 02/08/2020 shows no distant metastases  (10) started fulvestrant and palbociclib 02/24/2020 discontinued on 03/08/2020  (a) Oncotype sent on surgical sample shows score of 60 indicating a greater than 29% risk of recurrence on  antiestrogens alone, and also predicting a significant benefit from chemotherapy.  (b) the Oncotype test found this tumor to be  triple negative by single gene scores.   (11) cyclophosphamide, methotrexate, and fluorouracil [CMF] every 28 days x 8 cycles given from 03/27/2020-08/22/2020.  (12) fulvestrant resumed 10/03/2020   PLAN: [name] is now a year and a half out from definitive evidence of breast cancer recurrence.  She is on fulvestrant with good tolerance and the plan would be to continue that a total of 5 years.  There is currently no evidence of disease activity.  I have encouraged her to be as active as possible.  She does a little bit of exercises before getting out of bed every morning and she could do chair exercises as well.  We had discussed intensified screening at one-point given her mutation but because of cost issues as well as comorbidities we are continuing mammography on a yearly basis.  She also gets breast exam every 6 months: She sees her surgeon Dr. [name] in January and she will see [place] again next July  She knows to call for any other issue that may develop before the next visit.  Total encounter time 20 minutes.Sarajane Jews C. Ashleynicole Mcclees, MD 06/12/21 2:58 PM Medical Oncology and Hematology [facility] [place], [place] [address] Tel. [phone]    Fax. [phone]   I, Wilburn Mylar, am acting as scribe for Dr. Virgie Dad. Reni Hausner.  I, Lurline Del MD, have reviewed the above documentation for accuracy and completeness, and I agree with the above.   *Total Encounter Time as defined by the Centers for Medicare and Medicaid Services includes, in addition to the face-to-face time of a patient visit (documented in the note above) non-face-to-face time: obtaining and reviewing outside history, ordering and reviewing medications, tests or procedures, care coordination (communications with other health care professionals or caregivers)  and documentation in the medical record.

## 2021-06-13 ENCOUNTER — Telehealth: Payer: Self-pay | Admitting: Oncology

## 2021-06-13 ENCOUNTER — Inpatient Hospital Stay: Payer: PPO

## 2021-06-13 ENCOUNTER — Other Ambulatory Visit: Payer: Self-pay

## 2021-06-13 VITALS — BP 114/82 | HR 95 | Temp 98.0°F | Resp 17

## 2021-06-13 DIAGNOSIS — Z171 Estrogen receptor negative status [ER-]: Secondary | ICD-10-CM

## 2021-06-13 DIAGNOSIS — Z95828 Presence of other vascular implants and grafts: Secondary | ICD-10-CM

## 2021-06-13 DIAGNOSIS — Z1509 Genetic susceptibility to other malignant neoplasm: Secondary | ICD-10-CM

## 2021-06-13 DIAGNOSIS — C50919 Malignant neoplasm of unspecified site of unspecified female breast: Secondary | ICD-10-CM

## 2021-06-13 DIAGNOSIS — C50412 Malignant neoplasm of upper-outer quadrant of left female breast: Secondary | ICD-10-CM

## 2021-06-13 DIAGNOSIS — Z5111 Encounter for antineoplastic chemotherapy: Secondary | ICD-10-CM | POA: Diagnosis not present

## 2021-06-13 MED ORDER — FULVESTRANT 250 MG/5ML IM SOLN
500.0000 mg | INTRAMUSCULAR | Status: DC
  Administered 2021-06-13: 500 mg via INTRAMUSCULAR
  Filled 2021-06-13: qty 10

## 2021-06-13 NOTE — Patient Instructions (Signed)
Fulvestrant injection What is this medication? FULVESTRANT (ful VES trant) blocks the effects of estrogen. It is used to treat breast cancer. This medicine may be used for other purposes; ask your health care provider or pharmacist if you have questions. COMMON BRAND NAME(S): FASLODEX What should I tell my care team before I take this medication? They need to know if you have any of these conditions: bleeding disorders liver disease low blood counts, like low white cell, platelet, or red cell counts an unusual or allergic reaction to fulvestrant, other medicines, foods, dyes, or preservatives pregnant or trying to get pregnant breast-feeding How should I use this medication? This medicine is for injection into a muscle. It is usually given by a health care professional in a hospital or clinic setting. Talk to your pediatrician regarding the use of this medicine in children. Special care may be needed. Overdosage: If you think you have taken too much of this medicine contact a poison control center or emergency room at once. NOTE: This medicine is only for you. Do not share this medicine with others. What if I miss a dose? It is important not to miss your dose. Call your doctor or health care professional if you are unable to keep an appointment. What may interact with this medication? medicines that treat or prevent blood clots like warfarin, enoxaparin, dalteparin, apixaban, dabigatran, and rivaroxaban This list may not describe all possible interactions. Give your health care provider a list of all the medicines, herbs, non-prescription drugs, or dietary supplements you use. Also tell them if you smoke, drink alcohol, or use illegal drugs. Some items may interact with your medicine. What should I watch for while using this medication? Your condition will be monitored carefully while you are receiving this medicine. You will need important blood work done while you are taking this  medicine. Do not become pregnant while taking this medicine or for at least 1 year after stopping it. Women of child-bearing potential will need to have a negative pregnancy test before starting this medicine. Women should inform their doctor if they wish to become pregnant or think they might be pregnant. There is a potential for serious side effects to an unborn child. Men should inform their doctors if they wish to father a child. This medicine may lower sperm counts. Talk to your health care professional or pharmacist for more information. Do not breast-feed an infant while taking this medicine or for 1 year after the last dose. What side effects may I notice from receiving this medication? Side effects that you should report to your doctor or health care professional as soon as possible: allergic reactions like skin rash, itching or hives, swelling of the face, lips, or tongue feeling faint or lightheaded, falls pain, tingling, numbness, or weakness in the legs signs and symptoms of infection like fever or chills; cough; flu-like symptoms; sore throat vaginal bleeding Side effects that usually do not require medical attention (report to your doctor or health care professional if they continue or are bothersome): aches, pains constipation diarrhea headache hot flashes nausea, vomiting pain at site where injected stomach pain This list may not describe all possible side effects. Call your doctor for medical advice about side effects. You may report side effects to FDA at 1-800-FDA-1088. Where should I keep my medication? This drug is given in a hospital or clinic and will not be stored at home. NOTE: This sheet is a summary. It may not cover all possible information. If you have   questions about this medicine, talk to your doctor, pharmacist, or health care provider.  2022 Elsevier/Gold Standard (2017-12-10 11:34:41)  

## 2021-06-13 NOTE — Telephone Encounter (Signed)
Scheduled appointment per 09/28 los. Patient is aware.

## 2021-06-17 ENCOUNTER — Encounter: Payer: PPO | Admitting: Nurse Practitioner

## 2021-06-17 ENCOUNTER — Telehealth: Payer: Self-pay

## 2021-06-17 NOTE — Telephone Encounter (Signed)
The pt left a message that she needed to cancel her appt, that she has another appt this morning that she forgot about and that she will call back to reschedule.

## 2021-07-11 ENCOUNTER — Other Ambulatory Visit: Payer: Self-pay

## 2021-07-11 ENCOUNTER — Inpatient Hospital Stay: Payer: PPO | Attending: Oncology

## 2021-07-11 VITALS — BP 120/73 | HR 112 | Temp 98.8°F | Resp 20

## 2021-07-11 DIAGNOSIS — C50812 Malignant neoplasm of overlapping sites of left female breast: Secondary | ICD-10-CM | POA: Diagnosis not present

## 2021-07-11 DIAGNOSIS — Z5111 Encounter for antineoplastic chemotherapy: Secondary | ICD-10-CM | POA: Diagnosis not present

## 2021-07-11 DIAGNOSIS — Z95828 Presence of other vascular implants and grafts: Secondary | ICD-10-CM

## 2021-07-11 DIAGNOSIS — Z79899 Other long term (current) drug therapy: Secondary | ICD-10-CM | POA: Insufficient documentation

## 2021-07-11 DIAGNOSIS — C50412 Malignant neoplasm of upper-outer quadrant of left female breast: Secondary | ICD-10-CM

## 2021-07-11 DIAGNOSIS — C50919 Malignant neoplasm of unspecified site of unspecified female breast: Secondary | ICD-10-CM

## 2021-07-11 DIAGNOSIS — Z1589 Genetic susceptibility to other disease: Secondary | ICD-10-CM

## 2021-07-11 MED ORDER — FULVESTRANT 250 MG/5ML IM SOSY
500.0000 mg | PREFILLED_SYRINGE | Freq: Once | INTRAMUSCULAR | Status: AC
  Administered 2021-07-11: 500 mg via INTRAMUSCULAR
  Filled 2021-07-11: qty 10

## 2021-08-07 ENCOUNTER — Other Ambulatory Visit: Payer: Self-pay

## 2021-08-07 ENCOUNTER — Inpatient Hospital Stay: Payer: PPO | Attending: Oncology

## 2021-08-07 VITALS — BP 124/86 | HR 109 | Temp 98.5°F | Resp 18

## 2021-08-07 DIAGNOSIS — Z79899 Other long term (current) drug therapy: Secondary | ICD-10-CM | POA: Diagnosis not present

## 2021-08-07 DIAGNOSIS — Z5111 Encounter for antineoplastic chemotherapy: Secondary | ICD-10-CM | POA: Diagnosis not present

## 2021-08-07 DIAGNOSIS — C50412 Malignant neoplasm of upper-outer quadrant of left female breast: Secondary | ICD-10-CM

## 2021-08-07 DIAGNOSIS — C50812 Malignant neoplasm of overlapping sites of left female breast: Secondary | ICD-10-CM | POA: Diagnosis not present

## 2021-08-07 DIAGNOSIS — C50919 Malignant neoplasm of unspecified site of unspecified female breast: Secondary | ICD-10-CM

## 2021-08-07 DIAGNOSIS — Z1589 Genetic susceptibility to other disease: Secondary | ICD-10-CM

## 2021-08-07 DIAGNOSIS — Z95828 Presence of other vascular implants and grafts: Secondary | ICD-10-CM

## 2021-08-07 MED ORDER — FULVESTRANT 250 MG/5ML IM SOSY
500.0000 mg | PREFILLED_SYRINGE | Freq: Once | INTRAMUSCULAR | Status: AC
  Administered 2021-08-07: 500 mg via INTRAMUSCULAR
  Filled 2021-08-07: qty 10

## 2021-09-05 ENCOUNTER — Other Ambulatory Visit: Payer: Self-pay

## 2021-09-05 ENCOUNTER — Inpatient Hospital Stay: Payer: PPO | Attending: Oncology

## 2021-09-05 VITALS — BP 116/79 | HR 107 | Temp 98.2°F | Resp 18

## 2021-09-05 DIAGNOSIS — Z95828 Presence of other vascular implants and grafts: Secondary | ICD-10-CM

## 2021-09-05 DIAGNOSIS — C50412 Malignant neoplasm of upper-outer quadrant of left female breast: Secondary | ICD-10-CM

## 2021-09-05 DIAGNOSIS — C773 Secondary and unspecified malignant neoplasm of axilla and upper limb lymph nodes: Secondary | ICD-10-CM | POA: Diagnosis not present

## 2021-09-05 DIAGNOSIS — Z1589 Genetic susceptibility to other disease: Secondary | ICD-10-CM

## 2021-09-05 DIAGNOSIS — C50812 Malignant neoplasm of overlapping sites of left female breast: Secondary | ICD-10-CM | POA: Diagnosis not present

## 2021-09-05 DIAGNOSIS — Z5111 Encounter for antineoplastic chemotherapy: Secondary | ICD-10-CM | POA: Insufficient documentation

## 2021-09-05 DIAGNOSIS — Z79899 Other long term (current) drug therapy: Secondary | ICD-10-CM | POA: Insufficient documentation

## 2021-09-05 MED ORDER — FULVESTRANT 250 MG/5ML IM SOSY
500.0000 mg | PREFILLED_SYRINGE | Freq: Once | INTRAMUSCULAR | Status: AC
  Administered 2021-09-05: 14:00:00 500 mg via INTRAMUSCULAR
  Filled 2021-09-05: qty 10

## 2021-09-06 ENCOUNTER — Other Ambulatory Visit: Payer: Self-pay | Admitting: *Deleted

## 2021-09-06 ENCOUNTER — Telehealth: Payer: Self-pay | Admitting: *Deleted

## 2021-09-06 NOTE — Telephone Encounter (Signed)
This RN spoke with pt per her call asking about getting her port removed.   Pt has completed IV chemo for her recurrent breast cancer and port is no longer needed.  Informed her this note will be forwarded to Dr [name] whom the patient states she is seeing in January.

## 2021-10-03 ENCOUNTER — Other Ambulatory Visit: Payer: Self-pay

## 2021-10-03 ENCOUNTER — Inpatient Hospital Stay: Payer: PPO | Attending: Oncology

## 2021-10-03 VITALS — BP 126/82 | HR 100 | Temp 98.7°F | Resp 18

## 2021-10-03 DIAGNOSIS — Z79899 Other long term (current) drug therapy: Secondary | ICD-10-CM | POA: Diagnosis not present

## 2021-10-03 DIAGNOSIS — Z5111 Encounter for antineoplastic chemotherapy: Secondary | ICD-10-CM | POA: Diagnosis not present

## 2021-10-03 DIAGNOSIS — C773 Secondary and unspecified malignant neoplasm of axilla and upper limb lymph nodes: Secondary | ICD-10-CM | POA: Insufficient documentation

## 2021-10-03 DIAGNOSIS — C50812 Malignant neoplasm of overlapping sites of left female breast: Secondary | ICD-10-CM | POA: Diagnosis not present

## 2021-10-03 DIAGNOSIS — C50412 Malignant neoplasm of upper-outer quadrant of left female breast: Secondary | ICD-10-CM

## 2021-10-03 DIAGNOSIS — Z1509 Genetic susceptibility to other malignant neoplasm: Secondary | ICD-10-CM

## 2021-10-03 DIAGNOSIS — Z95828 Presence of other vascular implants and grafts: Secondary | ICD-10-CM

## 2021-10-03 DIAGNOSIS — C50919 Malignant neoplasm of unspecified site of unspecified female breast: Secondary | ICD-10-CM

## 2021-10-03 MED ORDER — FULVESTRANT 250 MG/5ML IM SOSY
500.0000 mg | PREFILLED_SYRINGE | Freq: Once | INTRAMUSCULAR | Status: AC
  Administered 2021-10-03: 500 mg via INTRAMUSCULAR
  Filled 2021-10-03: qty 10

## 2021-10-14 ENCOUNTER — Other Ambulatory Visit (HOSPITAL_COMMUNITY): Payer: Self-pay

## 2021-10-14 ENCOUNTER — Other Ambulatory Visit: Payer: Self-pay | Admitting: Adult Health

## 2021-10-14 DIAGNOSIS — I10 Essential (primary) hypertension: Secondary | ICD-10-CM

## 2021-10-14 DIAGNOSIS — R0609 Other forms of dyspnea: Secondary | ICD-10-CM

## 2021-10-15 ENCOUNTER — Other Ambulatory Visit (HOSPITAL_COMMUNITY): Payer: Self-pay

## 2021-10-15 ENCOUNTER — Encounter: Payer: Self-pay | Admitting: Hematology and Oncology

## 2021-10-15 MED ORDER — LISINOPRIL-HYDROCHLOROTHIAZIDE 20-12.5 MG PO TABS
0.5000 | ORAL_TABLET | Freq: Every day | ORAL | 5 refills | Status: DC
  Filled 2021-10-15: qty 45, 90d supply, fill #0
  Filled 2022-01-17: qty 45, 90d supply, fill #1
  Filled 2022-04-17: qty 45, 90d supply, fill #2
  Filled 2022-07-28: qty 45, 90d supply, fill #3

## 2021-10-17 ENCOUNTER — Other Ambulatory Visit (HOSPITAL_COMMUNITY): Payer: Self-pay

## 2021-10-18 ENCOUNTER — Encounter: Payer: Self-pay | Admitting: Hematology and Oncology

## 2021-10-18 ENCOUNTER — Other Ambulatory Visit (HOSPITAL_COMMUNITY): Payer: Self-pay

## 2021-10-22 DIAGNOSIS — Z171 Estrogen receptor negative status [ER-]: Secondary | ICD-10-CM | POA: Diagnosis not present

## 2021-10-22 DIAGNOSIS — C50412 Malignant neoplasm of upper-outer quadrant of left female breast: Secondary | ICD-10-CM | POA: Diagnosis not present

## 2021-10-22 DIAGNOSIS — Z452 Encounter for adjustment and management of vascular access device: Secondary | ICD-10-CM | POA: Diagnosis not present

## 2021-10-31 ENCOUNTER — Inpatient Hospital Stay: Payer: PPO | Attending: Oncology

## 2021-10-31 ENCOUNTER — Other Ambulatory Visit: Payer: Self-pay

## 2021-10-31 VITALS — BP 131/76 | HR 97 | Temp 98.7°F | Resp 18

## 2021-10-31 DIAGNOSIS — Z95828 Presence of other vascular implants and grafts: Secondary | ICD-10-CM

## 2021-10-31 DIAGNOSIS — C50919 Malignant neoplasm of unspecified site of unspecified female breast: Secondary | ICD-10-CM

## 2021-10-31 DIAGNOSIS — C50812 Malignant neoplasm of overlapping sites of left female breast: Secondary | ICD-10-CM | POA: Diagnosis not present

## 2021-10-31 DIAGNOSIS — Z79899 Other long term (current) drug therapy: Secondary | ICD-10-CM | POA: Diagnosis not present

## 2021-10-31 DIAGNOSIS — Z5111 Encounter for antineoplastic chemotherapy: Secondary | ICD-10-CM | POA: Insufficient documentation

## 2021-10-31 DIAGNOSIS — C773 Secondary and unspecified malignant neoplasm of axilla and upper limb lymph nodes: Secondary | ICD-10-CM | POA: Diagnosis not present

## 2021-10-31 DIAGNOSIS — Z1502 Genetic susceptibility to malignant neoplasm of ovary: Secondary | ICD-10-CM

## 2021-10-31 DIAGNOSIS — C50412 Malignant neoplasm of upper-outer quadrant of left female breast: Secondary | ICD-10-CM

## 2021-10-31 MED ORDER — FULVESTRANT 250 MG/5ML IM SOSY
500.0000 mg | PREFILLED_SYRINGE | Freq: Once | INTRAMUSCULAR | Status: AC
  Administered 2021-10-31: 500 mg via INTRAMUSCULAR
  Filled 2021-10-31: qty 10

## 2021-11-14 DIAGNOSIS — Z86010 Personal history of colonic polyps: Secondary | ICD-10-CM | POA: Diagnosis not present

## 2021-11-14 DIAGNOSIS — D509 Iron deficiency anemia, unspecified: Secondary | ICD-10-CM | POA: Diagnosis not present

## 2021-11-14 DIAGNOSIS — Z1211 Encounter for screening for malignant neoplasm of colon: Secondary | ICD-10-CM | POA: Diagnosis not present

## 2021-11-14 DIAGNOSIS — K760 Fatty (change of) liver, not elsewhere classified: Secondary | ICD-10-CM | POA: Diagnosis not present

## 2021-11-14 DIAGNOSIS — K743 Primary biliary cirrhosis: Secondary | ICD-10-CM | POA: Diagnosis not present

## 2021-11-21 ENCOUNTER — Telehealth: Payer: Self-pay | Admitting: Hematology and Oncology

## 2021-11-21 NOTE — Telephone Encounter (Signed)
Rescheduled appointment per providers. Patient aware.  ? ?

## 2021-11-28 ENCOUNTER — Inpatient Hospital Stay: Payer: PPO | Attending: Oncology

## 2021-11-28 ENCOUNTER — Other Ambulatory Visit: Payer: Self-pay

## 2021-11-28 VITALS — BP 101/63 | HR 109 | Temp 99.0°F | Resp 18

## 2021-11-28 DIAGNOSIS — Z5111 Encounter for antineoplastic chemotherapy: Secondary | ICD-10-CM | POA: Insufficient documentation

## 2021-11-28 DIAGNOSIS — C50812 Malignant neoplasm of overlapping sites of left female breast: Secondary | ICD-10-CM | POA: Insufficient documentation

## 2021-11-28 DIAGNOSIS — C773 Secondary and unspecified malignant neoplasm of axilla and upper limb lymph nodes: Secondary | ICD-10-CM | POA: Diagnosis not present

## 2021-11-28 MED ORDER — FULVESTRANT 250 MG/5ML IM SOSY
500.0000 mg | PREFILLED_SYRINGE | Freq: Once | INTRAMUSCULAR | Status: AC
  Administered 2021-11-28: 500 mg via INTRAMUSCULAR
  Filled 2021-11-28: qty 10

## 2021-11-28 NOTE — Patient Instructions (Signed)
Fulvestrant injection °What is this medication? °FULVESTRANT (ful VES trant) blocks the effects of estrogen. It is used to treat breast cancer. °This medicine may be used for other purposes; ask your health care provider or pharmacist if you have questions. °COMMON BRAND NAME(S): FASLODEX °What should I tell my care team before I take this medication? °They need to know if you have any of these conditions: °bleeding disorders °liver disease °low blood counts, like low white cell, platelet, or red cell counts °an unusual or allergic reaction to fulvestrant, other medicines, foods, dyes, or preservatives °pregnant or trying to get pregnant °breast-feeding °How should I use this medication? °This medicine is for injection into a muscle. It is usually given by a health care professional in a hospital or clinic setting. °Talk to your pediatrician regarding the use of this medicine in children. Special care may be needed. °Overdosage: If you think you have taken too much of this medicine contact a poison control center or emergency room at once. °NOTE: This medicine is only for you. Do not share this medicine with others. °What if I miss a dose? °It is important not to miss your dose. Call your doctor or health care professional if you are unable to keep an appointment. °What may interact with this medication? °medicines that treat or prevent blood clots like warfarin, enoxaparin, dalteparin, apixaban, dabigatran, and rivaroxaban °This list may not describe all possible interactions. Give your health care provider a list of all the medicines, herbs, non-prescription drugs, or dietary supplements you use. Also tell them if you smoke, drink alcohol, or use illegal drugs. Some items may interact with your medicine. °What should I watch for while using this medication? °Your condition will be monitored carefully while you are receiving this medicine. You will need important blood work done while you are taking this  medicine. °Do not become pregnant while taking this medicine or for at least 1 year after stopping it. Women of child-bearing potential will need to have a negative pregnancy test before starting this medicine. Women should inform their doctor if they wish to become pregnant or think they might be pregnant. There is a potential for serious side effects to an unborn child. Men should inform their doctors if they wish to father a child. This medicine may lower sperm counts. Talk to your health care professional or pharmacist for more information. Do not breast-feed an infant while taking this medicine or for 1 year after the last dose. °What side effects may I notice from receiving this medication? °Side effects that you should report to your doctor or health care professional as soon as possible: °allergic reactions like skin rash, itching or hives, swelling of the face, lips, or tongue °feeling faint or lightheaded, falls °pain, tingling, numbness, or weakness in the legs °signs and symptoms of infection like fever or chills; cough; flu-like symptoms; sore throat °vaginal bleeding °Side effects that usually do not require medical attention (report to your doctor or health care professional if they continue or are bothersome): °aches, pains °constipation °diarrhea °headache °hot flashes °nausea, vomiting °pain at site where injected °stomach pain °This list may not describe all possible side effects. Call your doctor for medical advice about side effects. You may report side effects to FDA at 1-800-FDA-1088. °Where should I keep my medication? °This drug is given in a hospital or clinic and will not be stored at home. °NOTE: This sheet is a summary. It may not cover all possible information. If you have   questions about this medicine, talk to your doctor, pharmacist, or health care provider. °© 2022 [employer]vier/Gold Standard (2017-12-15 00:00:00) ° °

## 2021-12-09 DIAGNOSIS — Z1212 Encounter for screening for malignant neoplasm of rectum: Secondary | ICD-10-CM | POA: Diagnosis not present

## 2021-12-09 DIAGNOSIS — Z1211 Encounter for screening for malignant neoplasm of colon: Secondary | ICD-10-CM | POA: Diagnosis not present

## 2021-12-18 ENCOUNTER — Encounter: Payer: Self-pay | Admitting: Nurse Practitioner

## 2021-12-18 ENCOUNTER — Ambulatory Visit (INDEPENDENT_AMBULATORY_CARE_PROVIDER_SITE_OTHER): Payer: PPO | Admitting: Nurse Practitioner

## 2021-12-18 ENCOUNTER — Ambulatory Visit (INDEPENDENT_AMBULATORY_CARE_PROVIDER_SITE_OTHER): Payer: PPO

## 2021-12-18 ENCOUNTER — Ambulatory Visit: Payer: PPO

## 2021-12-18 VITALS — BP 110/64 | HR 95 | Temp 98.1°F | Ht 61.0 in | Wt 223.0 lb

## 2021-12-18 DIAGNOSIS — C50912 Malignant neoplasm of unspecified site of left female breast: Secondary | ICD-10-CM | POA: Diagnosis not present

## 2021-12-18 DIAGNOSIS — Z9012 Acquired absence of left breast and nipple: Secondary | ICD-10-CM

## 2021-12-18 DIAGNOSIS — Z0000 Encounter for general adult medical examination without abnormal findings: Secondary | ICD-10-CM

## 2021-12-18 DIAGNOSIS — Z6841 Body Mass Index (BMI) 40.0 and over, adult: Secondary | ICD-10-CM

## 2021-12-18 DIAGNOSIS — I10 Essential (primary) hypertension: Secondary | ICD-10-CM | POA: Diagnosis not present

## 2021-12-18 DIAGNOSIS — R7303 Prediabetes: Secondary | ICD-10-CM

## 2021-12-18 DIAGNOSIS — Z79899 Other long term (current) drug therapy: Secondary | ICD-10-CM

## 2021-12-18 LAB — COLOGUARD: COLOGUARD: NEGATIVE

## 2021-12-18 NOTE — Patient Instructions (Signed)
Ms. Sobieski , ?Thank you for taking time to come for your Medicare Wellness Visit. I appreciate your ongoing commitment to your health goals. Please review the following plan we discussed and let me know if I can assist you in the future.  ? ?Screening recommendations/referrals: ?Colonoscopy: completed 08/13/2018 ?Mammogram: completed 03/01/2021, due 03/02/2022 ?Bone Density: completed 03/28/2021 ?Recommended yearly ophthalmology/optometry visit for glaucoma screening and checkup ?Recommended yearly dental visit for hygiene and checkup ? ?Vaccinations: ?Influenza vaccine: due next flu season ?Pneumococcal vaccine: decline ?Tdap vaccine: completed 11/11/2018, due 11/11/2028 ?Shingles vaccine: decline   ?Covid-19: 11/13/2020, 11/27/2019, 10/30/2019 ? ?Advanced directives: Advance directive discussed with you today. Even though you declined this today please call our office should you change your mind and we can give you the proper paperwork for you to fill out. ? ?Conditions/risks identified: none ? ?Next appointment: Follow up in one year for your annual wellness visit  ? ? ?Preventive Care 46 Years and Older, Female ?Preventive care refers to lifestyle choices and visits with your health care provider that can promote health and wellness. ?What does preventive care include? ?A yearly physical exam. This is also called an annual well check. ?Dental exams once or twice a year. ?Routine eye exams. Ask your health care provider how often you should have your eyes checked. ?Personal lifestyle choices, including: ?Daily care of your teeth and gums. ?Regular physical activity. ?Eating a healthy diet. ?Avoiding tobacco and drug use. ?Limiting alcohol use. ?Practicing safe sex. ?Taking low-dose aspirin every day. ?Taking vitamin and mineral supplements as recommended by your health care provider. ?What happens during an annual well check? ?The services and screenings done by your health care provider during your annual well check  will depend on your age, overall health, lifestyle risk factors, and family history of disease. ?Counseling  ?Your health care provider may ask you questions about your: ?Alcohol use. ?Tobacco use. ?Drug use. ?Emotional well-being. ?Home and relationship well-being. ?Sexual activity. ?Eating habits. ?History of falls. ?Memory and ability to understand (cognition). ?Work and work Statistician. ?Reproductive health. ?Screening  ?You may have the following tests or measurements: ?Height, weight, and BMI. ?Blood pressure. ?Lipid and cholesterol levels. These may be checked every 5 years, or more frequently if you are over 71 years old. ?Skin check. ?Lung cancer screening. You may have this screening every year starting at age 26 if you have a 30-pack-year history of smoking and currently smoke or have quit within the past 15 years. ?Fecal occult blood test (FOBT) of the stool. You may have this test every year starting at age 40. ?Flexible sigmoidoscopy or colonoscopy. You may have a sigmoidoscopy every 5 years or a colonoscopy every 10 years starting at age 39. ?Hepatitis C blood test. ?Hepatitis B blood test. ?Sexually transmitted disease (STD) testing. ?Diabetes screening. This is done by checking your blood sugar (glucose) after you have not eaten for a while (fasting). You may have this done every 1-3 years. ?Bone density scan. This is done to screen for osteoporosis. You may have this done starting at age 81. ?Mammogram. This may be done every 1-2 years. Talk to your health care provider about how often you should have regular mammograms. ?Talk with your health care provider about your test results, treatment options, and if necessary, the need for more tests. ?Vaccines  ?Your health care provider may recommend certain vaccines, such as: ?Influenza vaccine. This is recommended every year. ?Tetanus, diphtheria, and acellular pertussis (Tdap, Td) vaccine. You may need a Td booster  every 10 years. ?Zoster vaccine. You  may need this after age 74. ?Pneumococcal 13-valent conjugate (PCV13) vaccine. One dose is recommended after age 52. ?Pneumococcal polysaccharide (PPSV23) vaccine. One dose is recommended after age 56. ?Talk to your health care provider about which screenings and vaccines you need and how often you need them. ?This information is not intended to replace advice given to you by your health care provider. Make sure you discuss any questions you have with your health care provider. ?Document Released: 09/28/2015 Document Revised: 05/21/2016 Document Reviewed: 07/03/2015 ?Elsevier Interactive Patient Education ? 2017 [place]. ? ?Fall Prevention in the Home ?Falls can cause injuries. They can happen to people of all ages. There are many things you can do to make your home safe and to help prevent falls. ?What can I do on the outside of my home? ?Regularly fix the edges of walkways and driveways and fix any cracks. ?Remove anything that might make you trip as you walk through a door, such as a raised step or threshold. ?Trim any bushes or trees on the path to your home. ?Use bright outdoor lighting. ?Clear any walking paths of anything that might make someone trip, such as rocks or tools. ?Regularly check to see if handrails are loose or broken. Make sure that both sides of any steps have handrails. ?Any raised decks and porches should have guardrails on the edges. ?Have any leaves, snow, or ice cleared regularly. ?Use sand or salt on walking paths during winter. ?Clean up any spills in your garage right away. This includes oil or grease spills. ?What can I do in the bathroom? ?Use night lights. ?Install grab bars by the toilet and in the tub and shower. Do not use towel bars as grab bars. ?Use non-skid mats or decals in the tub or shower. ?If you need to sit down in the shower, use a plastic, non-slip stool. ?Keep the floor dry. Clean up any water that spills on the floor as soon as it happens. ?Remove soap buildup  in the tub or shower regularly. ?Attach bath mats securely with double-sided non-slip rug tape. ?Do not have throw rugs and other things on the floor that can make you trip. ?What can I do in the bedroom? ?Use night lights. ?Make sure that you have a light by your bed that is easy to reach. ?Do not use any sheets or blankets that are too big for your bed. They should not hang down onto the floor. ?Have a firm chair that has side arms. You can use this for support while you get dressed. ?Do not have throw rugs and other things on the floor that can make you trip. ?What can I do in the kitchen? ?Clean up any spills right away. ?Avoid walking on wet floors. ?Keep items that you use a lot in easy-to-reach places. ?If you need to reach something above you, use a strong step stool that has a grab bar. ?Keep electrical cords out of the way. ?Do not use floor polish or wax that makes floors slippery. If you must use wax, use non-skid floor wax. ?Do not have throw rugs and other things on the floor that can make you trip. ?What can I do with my stairs? ?Do not leave any items on the stairs. ?Make sure that there are handrails on both sides of the stairs and use them. Fix handrails that are broken or loose. Make sure that handrails are as long as the stairways. ?Check any carpeting to  make sure that it is firmly attached to the stairs. Fix any carpet that is loose or worn. ?Avoid having throw rugs at the top or bottom of the stairs. If you do have throw rugs, attach them to the floor with carpet tape. ?Make sure that you have a light switch at the top of the stairs and the bottom of the stairs. If you do not have them, ask someone to add them for you. ?What else can I do to help prevent falls? ?Wear shoes that: ?Do not have high heels. ?Have rubber bottoms. ?Are comfortable and fit you well. ?Are closed at the toe. Do not wear sandals. ?If you use a stepladder: ?Make sure that it is fully opened. Do not climb a closed  stepladder. ?Make sure that both sides of the stepladder are locked into place. ?Ask someone to hold it for you, if possible. ?Clearly mark and make sure that you can see: ?Any grab bars or handrails. ?First a

## 2021-12-18 NOTE — Patient Instructions (Signed)

## 2021-12-18 NOTE — Progress Notes (Signed)
?Industrial/product designer as a Education administrator for [employer], FNP.,have documented all relevant documentation on the behalf of Minette Brine, FNP,as directed by  Minette Brine, FNP while in the presence of Minette Brine, [place]. ? ?This visit occurred during the SARS-CoV-2 public health emergency.  Safety protocols were in place, including screening questions prior to the visit, additional usage of staff PPE, and extensive cleaning of exam room while observing appropriate contact time as indicated for disinfecting solutions. ? ?Subjective:  ?  ? [name] , female    DOB: 03-Nov-1950 , 71 y.o.   MRN: [id number] ? ? ?Chief Complaint  ?Patient presents with  ? Hypertension  ? ? ?HPI ? ?Pt here today for hypertension check, she has no other complaints today. Reports I have been doing well. Her next appt with Oncology is in June but she is going monthly for an infusion  ? ?BP Readings from Last 3 Encounters: ?12/18/21 : 110/64 ?11/28/21 : 101/63 ?10/31/21 : 131/76 ? ? ? ?Hypertension ?This is a chronic problem. The current episode started more than 1 year ago. The problem is unchanged. The problem is controlled. Pertinent negatives include no anxiety, chest pain, headaches or palpitations. There are no associated agents to hypertension. Risk factors for coronary artery disease include sedentary lifestyle and obesity. Past treatments include diuretics and ACE inhibitors. Compliance problems include exercise.  There is no history of angina. There is no history of chronic renal disease.   ? ?Past Medical History:  ?Diagnosis Date  ? Colon cancer ([place])   ? Hx of rotator cuff surgery   ? Hypertension   ? left breast cancer   ? Personal history of chemotherapy 2016  ? left  ? Personal history of radiation therapy 2016  ? left breast   ? Radiation 11/15/14-01/02/15  ? left breast, axillary and supraclavicular region 45 gray, lumpectomy cavity boosted to 16 gray  ?  ? ?Family History  ?Problem Relation Age of Onset  ?  Cancer Mother 50  ?     breast  ? Breast cancer Mother   ?     early 56s  ? Stroke Father 18  ?     cause of death  ? Diabetes Father   ? Cancer Maternal Aunt   ?     breast cancer at unknown age  ? Breast cancer Maternal Aunt   ? Ovarian cancer Neg Hx   ? ? ? ?Current Outpatient Medications:  ?  acetaminophen (TYLENOL) 500 MG tablet, Take 1,000 mg by mouth every 6 (six) hours as needed for mild pain or moderate pain., Disp: , Rfl:  ?  gabapentin (NEURONTIN) 300 MG capsule, Take 1 capsule (300 mg total) by mouth at bedtime., Disp: 90 capsule, Rfl: 4 ?  hydroxypropyl methylcellulose / hypromellose (ISOPTO TEARS / GONIOVISC) 2.5 % ophthalmic solution, Place 1 drop into both eyes daily as needed for dry eyes., Disp: , Rfl:  ?  Liniments (SALONPAS ARTHRITIS PAIN RELIEF EX), Apply 1 application topically daily as needed (knee pain)., Disp: , Rfl:  ?  lisinopril-hydrochlorothiazide (ZESTORETIC) 20-12.5 MG tablet, TAKE 1/2 TABLET BY MOUTH DAILY, Disp: 45 tablet, Rfl: 5 ?  LUMIGAN 0.01 % SOLN, , Disp: , Rfl:  ?  Multiple Vitamins-Minerals (AIRBORNE PO), Take 1 tablet by mouth 2 (two) times a week., Disp: , Rfl:  ?  ursodiol (ACTIGALL) 500 MG tablet, Take 500 mg by mouth 3 (three) times daily. , Disp: , Rfl:   ? ?No Known Allergies  ? ?  Review of Systems  ?Constitutional: Negative.   ?Respiratory: Negative.    ?Cardiovascular: Negative.  Negative for chest pain and palpitations.  ?Gastrointestinal: Negative.   ?Neurological: Negative.  Negative for headaches.   ? ?Today's Vitals  ? 12/18/21 0945  ?BP: 110/64  ?Pulse: 95  ?Temp: 98.1 ?F (36.7 ?C)  ?TempSrc: Oral  ?Weight: 223 lb (101.2 kg)  ?Height: 5' 1"  (1.549 m)  ? ?Body mass index is 42.14 kg/m?.  ?Wt Readings from Last 3 Encounters:  ?12/18/21 223 lb (101.2 kg)  ?06/12/21 237 lb 11.2 oz (107.8 kg)  ?12/12/20 243 lb 3.2 oz (110.3 kg)  ? ? ?Objective:  ?Physical Exam ?Vitals reviewed.  ?Constitutional:   ?   General: She is not in acute distress. ?   Appearance: Normal  appearance. She is obese.  ?Eyes:  ?   Pupils: Pupils are equal, round, and reactive to light.  ?Cardiovascular:  ?   Rate and Rhythm: Normal rate and regular rhythm.  ?   Pulses: Normal pulses.  ?   Heart sounds: Normal heart sounds. No murmur heard. ?Pulmonary:  ?   Effort: Pulmonary effort is normal. No respiratory distress.  ?   Breath sounds: Normal breath sounds. No wheezing.  ?Skin: ?   General: Skin is warm and dry.  ?   Capillary Refill: Capillary refill takes less than 2 seconds.  ?Neurological:  ?   General: No focal deficit present.  ?   Mental Status: She is alert and oriented to person, place, and time.  ?   Cranial Nerves: No cranial nerve deficit.  ?   Motor: No weakness.  ?Psychiatric:     ?   Mood and Affect: Mood normal.     ?   Behavior: Behavior normal.     ?   Thought Content: Thought content normal.     ?   Judgment: Judgment normal.  ?  ? ?   ?Assessment And Plan:  ?   ?1. Essential hypertension ?Comments: Blood pressure is controlled, Continue current medications ?- AMB Referral to [facility] ?- CMP14+EGFR ? ?2. Prediabetes ?Comments: No current medications, stable.  ?- Hemoglobin A1c ?- CMP14+EGFR ? ?3. Recurrent cancer of left breast ([place]) ?Comments: Continue follow up with Oncology ? ?4. S/P mastectomy, left ? ?5. Obesity, morbid, BMI 40.0-49.9 ([place]) ?Comments: Encouraged to increase physical activity to at least 150 minutes week ? She is encouraged to strive for BMI less than 30 to decrease cardiac risk. Advised to aim for at least 150 minutes of exercise per week. ? ?6. Other long term (current) drug therapy ?- CBC ? ? ? ? ?Patient was given opportunity to ask questions. Patient verbalized understanding of the plan and was able to repeat key elements of the plan. All questions were answered to their satisfaction.  ?Minette Brine, FNP  ? ?I, Minette Brine, FNP, have reviewed all documentation for this visit. The documentation on 12/18/21 for the exam, diagnosis, procedures,  and orders are all accurate and complete.  ? ?IF YOU HAVE BEEN REFERRED TO A SPECIALIST, IT MAY TAKE 1-2 WEEKS TO SCHEDULE/PROCESS THE REFERRAL. IF YOU HAVE NOT HEARD FROM US/SPECIALIST IN TWO WEEKS, PLEASE GIVE [place] A CALL AT [phone] X [phone].  ? ?THE PATIENT IS ENCOURAGED TO PRACTICE SOCIAL DISTANCING DUE TO THE COVID-19 PANDEMIC.   ?

## 2021-12-18 NOTE — Progress Notes (Signed)
?This visit occurred during the SARS-CoV-2 public health emergency.  Safety protocols were in place, including screening questions prior to the visit, additional usage of staff PPE, and extensive cleaning of exam room while observing appropriate contact time as indicated for disinfecting solutions. ? ?Subjective:  ? [name] is a 71 y.o. female who presents for Medicare Annual (Subsequent) preventive examination. ? ?Review of Systems    ? ?Cardiac Risk Factors include: advanced age (>63mn, >>24women);hypertension;obesity (BMI >30kg/m2) ? ?   ?Objective:  ?  ?Today's Vitals  ? 12/18/21 1020  ?BP: 110/64  ?Pulse: 95  ?Temp: 98.1 ?F (36.7 ?C)  ?TempSrc: Oral  ?Weight: 223 lb (101.2 kg)  ?Height: '5\' 1"'$  (1.549 m)  ? ?Body mass index is 42.14 kg/m?. ? ? ?  12/18/2021  ? 10:24 AM 12/12/2020  ?  9:09 AM 07/11/2020  ? 10:13 AM 06/20/2020  ?  9:54 AM 05/30/2020  ?  1:15 PM 05/09/2020  ?  1:02 PM 04/18/2020  ?  1:32 PM  ?Advanced Directives  ?Does Patient Have a Medical Advance Directive? No No Yes Yes Yes Yes Yes  ?Type of Advance Directive   Living will Living will Living will Living will Living will  ?Does patient want to make changes to medical advance directive?   No - Patient declined      ?Would patient like information on creating a medical advance directive? No - Patient declined No - Patient declined No - Patient declined No - Patient declined No - Patient declined No - Patient declined No - Patient declined  ? ? ?Current Medications (verified) ?Outpatient Encounter Medications as of 12/18/2021  ?Medication Sig  ? acetaminophen (TYLENOL) 500 MG tablet Take 1,000 mg by mouth every 6 (six) hours as needed for mild pain or moderate pain.  ? gabapentin (NEURONTIN) 300 MG capsule Take 1 capsule (300 mg total) by mouth at bedtime.  ? hydroxypropyl methylcellulose / hypromellose (ISOPTO TEARS / GONIOVISC) 2.5 % ophthalmic solution Place 1 drop into both eyes daily as needed for dry eyes.  ? Liniments (SALONPAS ARTHRITIS  PAIN RELIEF EX) Apply 1 application topically daily as needed (knee pain).  ? lisinopril-hydrochlorothiazide (ZESTORETIC) 20-12.5 MG tablet TAKE 1/2 TABLET BY MOUTH DAILY  ? LUMIGAN 0.01 % SOLN   ? Multiple Vitamins-Minerals (AIRBORNE PO) Take 1 tablet by mouth 2 (two) times a week.  ? ursodiol (ACTIGALL) 500 MG tablet Take 500 mg by mouth 3 (three) times daily.   ? [DISCONTINUED] prochlorperazine (COMPAZINE) 10 MG tablet Take 1 tablet (10 mg total) by mouth every 6 (six) hours as needed (Nausea or vomiting).  ? ?No facility-administered encounter medications on file as of 12/18/2021.  ? ? ?Allergies (verified) ?Patient has no known allergies.  ? ?History: ?Past Medical History:  ?Diagnosis Date  ? [place]   ? Hx of rotator cuff surgery   ? Hypertension   ? left breast cancer   ? Personal history of chemotherapy 2016  ? left  ? Personal history of radiation therapy 2016  ? left breast   ? Radiation 11/15/14-01/02/15  ? left breast, axillary and supraclavicular region 45 gray, lumpectomy cavity boosted to 16 gray  ? ?Past Surgical History:  ?Procedure Laterality Date  ? ABDOMINAL HYSTERECTOMY  1988  ? BREAST EXCISIONAL BIOPSY Right 12/21/2017  ? BREAST LUMPECTOMY Left 2016  ? COLON SURGERY  1988  ? colectomy/colostomy-ca  ? COLON SURGERY  1988  ? colostomy takedown-reversal  ? COLONOSCOPY    ? EXCISION /  BIOPSY BREAST / NIPPLE / DUCT Left 09/20/2014  ? IR IMAGING GUIDED PORT INSERTION  03/22/2020  ? MASTECTOMY Left 02/14/2020  ? MASTECTOMY W/ SENTINEL NODE BIOPSY Left 02/14/2020  ? Procedure: LEFT MASTECTOMY WITH BLUE DYE INJECTION;  Surgeon: Rolm Bookbinder, MD;  Location: [facility];  Service: General;  Laterality: Left;  PEC BLOCK  ? RADIOACTIVE SEED GUIDED EXCISIONAL BREAST BIOPSY Right 12/21/2017  ? Procedure: RIGHT RADIOACTIVE SEED GUIDED EXCISIONAL BREAST BIOPSY ERAS PATHWAY;  Surgeon: Rolm Bookbinder, MD;  Location: [facility];  Service: General;  Laterality: Right;  ? RADIOACTIVE SEED GUIDED  PARTIAL MASTECTOMY WITH AXILLARY SENTINEL LYMPH NODE BIOPSY Left 09/20/2014  ? Procedure: RADIOACTIVE SEED GUIDED LEFT BREAST LUMPECTOMY WITH AXILLARY SENTINEL LYMPH NODE BIOPSY;  Surgeon: Rolm Bookbinder, MD;  Location: [facility];  Service: General;  Laterality: Left;  ? TOTAL SHOULDER ARTHROPLASTY  2009  ? right  ? ?Family History  ?Problem Relation Age of Onset  ? Cancer Mother 82  ?     breast  ? Breast cancer Mother   ?     early 27s  ? Stroke Father 53  ?     cause of death  ? Diabetes Father   ? Cancer Maternal Aunt   ?     breast cancer at unknown age  ? Breast cancer Maternal Aunt   ? Ovarian cancer Neg Hx   ? ?Social History  ? ?Socioeconomic History  ? Marital status: Divorced  ?  Spouse name: Not on file  ? Number of children: 2  ? Years of education: Not on file  ? Highest education level: Not on file  ?Occupational History  ?  Employer: [employer]  ?  Comment: retired in 2008  ?Tobacco Use  ? Smoking status: Never  ? Smokeless tobacco: Never  ?Vaping Use  ? Vaping Use: Never used  ?Substance and Sexual Activity  ? Alcohol use: Not Currently  ?  Comment: occ-rare  ? Drug use: No  ? Sexual activity: Not Currently  ?Other Topics Concern  ? Not on file  ?Social History Narrative  ? Not on file  ? ?Social Determinants of Health  ? ?Financial Resource Strain: Low Risk   ? Difficulty of Paying Living Expenses: Not hard at all  ?Food Insecurity: No Food Insecurity  ? Worried About Charity fundraiser in the Last Year: Never true  ? Ran Out of Food in the Last Year: Never true  ?Transportation Needs: No Transportation Needs  ? Lack of Transportation (Medical): No  ? Lack of Transportation (Non-Medical): No  ?Physical Activity: Inactive  ? Days of Exercise per Week: 0 days  ? Minutes of Exercise per Session: 0 min  ?Stress: No Stress Concern Present  ? Feeling of Stress : Not at all  ?Social Connections: Not on file  ? ? ?Tobacco Counseling ?Counseling given: Not Answered ? ? ?Clinical  Intake: ? ?Pre-visit preparation completed: Yes ? ?Pain : No/denies pain ? ?  ? ?Nutritional Status: BMI > 30  Obese ?Nutritional Risks: None ?Diabetes: No ? ?How often do you need to have someone help you when you read instructions, pamphlets, or other written materials from your doctor or pharmacy?: 1 - Never ?What is the last grade level you completed in school?: 12th grade ? ?Diabetic? no ? ?Interpreter Needed?: No ? ?Information entered by :: NAllen LPN ? ? ?Activities of Daily Living ? ?  12/18/2021  ? 10:24 AM  ?In your present state  of health, do you have any difficulty performing the following activities:  ?Hearing? 0  ?Vision? 0  ?Difficulty concentrating or making decisions? 0  ?Walking or climbing stairs? 1  ?Dressing or bathing? 0  ?Doing errands, shopping? 0  ?Preparing Food and eating ? N  ?Using the Toilet? N  ?In the past six months, have you accidently leaked urine? N  ?Do you have problems with loss of bowel control? N  ?Managing your Medications? N  ?Managing your Finances? N  ?Housekeeping or managing your Housekeeping? N  ? ? ?Patient Care Team: ?Minette Brine, FNP as PCP - General (General Practice) ?Magrinat, Virgie Dad, MD (Inactive) as Consulting Physician (Oncology) ?Rolm Bookbinder, MD as Consulting Physician (General Surgery) ?Gery Pray, MD as Consulting Physician (Radiation Oncology) ?[name], MD as Consulting Physician (Ophthalmology) ? ?Indicate any recent Medical Services you may have received from other than [facility] providers in the past year (date may be approximate). ? ?   ?Assessment:  ? This is a routine wellness examination for [facility]. ? ?Hearing/Vision screen ?Vision Screening - Comments:: Regular eye exams, Dr. Venetia Maxon ? ?Dietary issues and exercise activities discussed: ?Current Exercise Habits: The patient does not participate in regular exercise at present ? ? Goals Addressed   ? ?  ?  ?  ?  ? This Visit's Progress  ?  Patient Stated     ?  12/18/2021, be more active ?   ? ?  ? ?Depression Screen ? ?  12/18/2021  ? 10:24 AM 12/18/2021  ?  9:44 AM 12/12/2020  ?  9:10 AM 12/07/2019  ? 10:40 AM 05/12/2019  ? 10:21 AM 11/11/2018  ? 12:23 PM 10/04/2014  ? 11:15 AM  ?PHQ 2/9 Scores  ?PHQ -

## 2021-12-19 LAB — CMP14+EGFR
ALT: 17 IU/L (ref 0–32)
AST: 12 IU/L (ref 0–40)
Albumin/Globulin Ratio: 1.6 (ref 1.2–2.2)
Albumin: 4.2 g/dL (ref 3.8–4.8)
Alkaline Phosphatase: 146 IU/L — ABNORMAL HIGH (ref 44–121)
BUN/Creatinine Ratio: 18 (ref 12–28)
BUN: 17 mg/dL (ref 8–27)
Bilirubin Total: 0.6 mg/dL (ref 0.0–1.2)
CO2: 20 mmol/L (ref 20–29)
Calcium: 9.7 mg/dL (ref 8.7–10.3)
Chloride: 104 mmol/L (ref 96–106)
Creatinine, Ser: 0.93 mg/dL (ref 0.57–1.00)
Globulin, Total: 2.6 g/dL (ref 1.5–4.5)
Glucose: 101 mg/dL — ABNORMAL HIGH (ref 70–99)
Potassium: 4.1 mmol/L (ref 3.5–5.2)
Sodium: 140 mmol/L (ref 134–144)
Total Protein: 6.8 g/dL (ref 6.0–8.5)
eGFR: 66 mL/min/{1.73_m2} (ref >59)

## 2021-12-19 LAB — CBC
Hematocrit: 33.2 % — ABNORMAL LOW (ref 34.0–46.6)
Hemoglobin: 11 g/dL — ABNORMAL LOW (ref 11.1–15.9)
MCH: 29.3 pg (ref 26.6–33.0)
MCHC: 33.1 g/dL (ref 31.5–35.7)
MCV: 88 fL (ref 79–97)
Platelets: 299 10*3/uL (ref 150–450)
RBC: 3.76 x10E6/uL — ABNORMAL LOW (ref 3.77–5.28)
RDW: 12 % (ref 11.7–15.4)
WBC: 5.3 10*3/uL (ref 3.4–10.8)

## 2021-12-19 LAB — HEMOGLOBIN A1C
Est. average glucose Bld gHb Est-mCnc: 123 mg/dL
Hgb A1c MFr Bld: 5.9 % — ABNORMAL HIGH (ref 4.8–5.6)

## 2021-12-26 ENCOUNTER — Inpatient Hospital Stay: Payer: PPO | Attending: Oncology

## 2021-12-26 ENCOUNTER — Other Ambulatory Visit: Payer: Self-pay | Admitting: *Deleted

## 2021-12-26 ENCOUNTER — Other Ambulatory Visit: Payer: Self-pay

## 2021-12-26 VITALS — BP 139/83 | HR 100 | Temp 98.9°F | Resp 18

## 2021-12-26 DIAGNOSIS — Z171 Estrogen receptor negative status [ER-]: Secondary | ICD-10-CM

## 2021-12-26 DIAGNOSIS — C773 Secondary and unspecified malignant neoplasm of axilla and upper limb lymph nodes: Secondary | ICD-10-CM | POA: Diagnosis not present

## 2021-12-26 DIAGNOSIS — Z95828 Presence of other vascular implants and grafts: Secondary | ICD-10-CM

## 2021-12-26 DIAGNOSIS — Z79899 Other long term (current) drug therapy: Secondary | ICD-10-CM | POA: Diagnosis not present

## 2021-12-26 DIAGNOSIS — Z5111 Encounter for antineoplastic chemotherapy: Secondary | ICD-10-CM | POA: Insufficient documentation

## 2021-12-26 DIAGNOSIS — C50919 Malignant neoplasm of unspecified site of unspecified female breast: Secondary | ICD-10-CM

## 2021-12-26 DIAGNOSIS — C50812 Malignant neoplasm of overlapping sites of left female breast: Secondary | ICD-10-CM | POA: Insufficient documentation

## 2021-12-26 MED ORDER — FULVESTRANT 250 MG/5ML IM SOSY
500.0000 mg | PREFILLED_SYRINGE | Freq: Once | INTRAMUSCULAR | Status: AC
  Administered 2021-12-26: 500 mg via INTRAMUSCULAR
  Filled 2021-12-26: qty 10

## 2022-01-03 ENCOUNTER — Telehealth: Payer: Self-pay | Admitting: *Deleted

## 2022-01-03 NOTE — Chronic Care Management (AMB) (Signed)
?  Care Management  ? ?Outreach Note ? ?01/03/2022 ?Name: [name] MRN: [id number] DOB: Oct 13, 1950 ? ?Referred by: Minette Brine, FNP ?Reason for referral : Care Coordination (Initial outreach to schedule with BSW and RNCM ) ? ? ?An unsuccessful telephone outreach was attempted today. The patient was referred to the case management team for assistance with care management and care coordination.  ? ?Follow Up Plan:  ?A HIPAA compliant phone message was left for the patient providing contact information and requesting a return call.  ?The care management team will reach out to the patient again over the next 7 days.  ?If patient returns call to provider office, please advise to call [place]* at [phone].* ? ?Laverda Sorenson  ?Care Guide, Embedded Care Coordination ?[place] [facility]  Care Management  ?Direct Dial: [phone] ? ?

## 2022-01-03 NOTE — Chronic Care Management (AMB) (Signed)
?  Care Management  ? ?Note ? ?01/03/2022 ?Name: [name] MRN: [id number] DOB: 1951-08-11 ? ?[name] is a 71 y.o. year old female who is a primary care patient of Minette Brine, [place]. I reached out to [name] by phone today offer care coordination services.  ? ?[name] was given information about care management services today including:  ?Care management services include personalized support from designated clinical staff supervised by her physician, including individualized plan of care and coordination with other care providers ?24/7 contact phone numbers for assistance for urgent and routine care needs. ?The patient may stop care management services at any time by phone call to the office staff. ? ?Patient agreed to services and verbal consent obtained.  ? ?Follow up plan: ?Telephone appointment with care management team member scheduled for:01/06/22 ? ?[name]  ?Care Guide, Embedded Care Coordination ?[facility]  ?Direct Dial: [phone] ? ?

## 2022-01-06 ENCOUNTER — Telehealth: Payer: PPO

## 2022-01-06 ENCOUNTER — Ambulatory Visit: Payer: Self-pay

## 2022-01-06 ENCOUNTER — Telehealth: Payer: Self-pay

## 2022-01-06 ENCOUNTER — Ambulatory Visit: Payer: PPO

## 2022-01-06 DIAGNOSIS — I10 Essential (primary) hypertension: Secondary | ICD-10-CM

## 2022-01-06 DIAGNOSIS — R7303 Prediabetes: Secondary | ICD-10-CM

## 2022-01-06 NOTE — Telephone Encounter (Signed)
?  Care Management  ? ?Follow Up Note ? ? ?01/06/2022 ?Name: [name] MRN: [id number] DOB: 02-02-1951 ? ? ?Referred by: Minette Brine, FNP ?Reason for referral : Care Coordination (Unsuccessful call) ? ? ?An unsuccessful telephone outreach was attempted today. The patient was referred to the case management team for assistance with care management and care coordination.  ? ?Follow Up Plan: The care management team will reach out to the patient again over the next 30 days.  ? ?Daneen Schick, BSW, CDP ?Social Worker, Certified Dementia Practitioner ?[facility] ?[phone] ? ?   ? ? ?

## 2022-01-06 NOTE — Chronic Care Management (AMB) (Signed)
? Social Work Note ? ?01/06/2022 ?Name: [name] MRN: [id number] DOB: 1951/06/29 ? ?[name] is a 71 y.o. year old female who is a primary care patient of [name], [name].  The Care Management team was consulted for assistance with chronic disease management and care coordination needs. ? ?[name] was given information about Care Management services today including:  ?Care Management services include personalized support from designated clinical staff supervised by her physician, including individualized plan of care and coordination with other care providers ?24/7 contact phone numbers for assistance for urgent and routine care needs. ?The patient may stop care management services at any time (effective at the end of the month) by phone call to the office staff. ? ?Patient agreed to services and consent obtained.  ? ?Engaged with patient by telephone for initial visit in response to provider referral for social work chronic care management and care coordination services. ? ?Assessment: Review of patient past medical history, allergies, medications, and health status, including review of pertinent consultant reports was performed as part of comprehensive evaluation and provision of care management/care coordination services.  ? ?SDOH (Social Determinants of Health) assessments and interventions performed:  Yes ?SDOH Interventions   ? ?Flowsheet Row Most Recent Value  ?SDOH Interventions   ?Food Insecurity Interventions Intervention Not Indicated  [pt reports children support food and goes to food bank]  ?Housing Interventions Other (Comment)  [mailed info on LIEAP for next year]  ?Transportation Interventions Intervention Not Indicated  ? ?  ?  ? ?Advanced Directives Status: See Care Plan for related entries. ? ?Care Plan ? ?No Known Allergies ? ?Outpatient Encounter Medications as of 01/06/2022  ?Medication Sig  ? acetaminophen (TYLENOL) 500 MG tablet Take 1,000 mg by mouth every 6  (six) hours as needed for mild pain or moderate pain.  ? gabapentin (NEURONTIN) 300 MG capsule Take 1 capsule (300 mg total) by mouth at bedtime.  ? hydroxypropyl methylcellulose / hypromellose (ISOPTO TEARS / GONIOVISC) 2.5 % ophthalmic solution Place 1 drop into both eyes daily as needed for dry eyes.  ? Liniments (SALONPAS ARTHRITIS PAIN RELIEF EX) Apply 1 application topically daily as needed (knee pain).  ? lisinopril-hydrochlorothiazide (ZESTORETIC) 20-12.5 MG tablet TAKE 1/2 TABLET BY MOUTH DAILY  ? LUMIGAN 0.01 % SOLN   ? Multiple Vitamins-Minerals (AIRBORNE PO) Take 1 tablet by mouth 2 (two) times a week.  ? ursodiol (ACTIGALL) 500 MG tablet Take 500 mg by mouth 3 (three) times daily.   ? [DISCONTINUED] prochlorperazine (COMPAZINE) 10 MG tablet Take 1 tablet (10 mg total) by mouth every 6 (six) hours as needed (Nausea or vomiting).  ? ?No facility-administered encounter medications on file as of 01/06/2022.  ? ? ?Patient Active Problem List  ? Diagnosis Date Noted  ? Port-A-Cath in place 03/27/2020  ? Recurrent cancer of left breast ([name]) 02/14/2020  ? Recurrent breast adenocarcinoma ([name]) 01/30/2020  ? Essential hypertension 12/07/2019  ? Dyspnea on exertion 11/11/2018  ? Right leg pain 11/11/2018  ? Obesity, morbid, BMI 40.0-49.9 ([name]) 10/11/2015  ? 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 Genetic testing 05/24/2015  ? Elevated LFTs 03/08/2015  ? Anemia in neoplastic disease 06/12/2014  ? Hypokalemia 05/29/2014  ? Watery eyes 05/09/2014  ? Malignant neoplasm of colon ([name]) 04/03/2014  ? Family history of malignant neoplasm of breast 04/03/2014  ? Malignant neoplasm of upper-outer quadrant of left breast in female, estrogen receptor negative ([name]) 03/01/2014  ? ? ?Conditions to be addressed/monitored: HTN and Prediabetes ;  Financial constraints related to heating costs and Advanced Directive education ? ?Care Plan : Social Work Plan of Care  ?Updates made by [name] since 01/06/2022 12:00 AM  ?   ? ?Problem: Quality of Life (General Plan of Care)   ?  ? ?Goal: Quality of Life Maintained   ?Start Date: 01/06/2022  ?Priority: High  ?Note:   ?Current Barriers:  ?Chronic disease management support and education needs related to HTN and Prediabetes   ?Financial constraints related to costs of heating home ?Does not have an Advanced Directive ? ?Social Worker Clinical Goal(s):  ?patient will work with SW to identify and address any acute and/or chronic care coordination needs related to the self health management of HTN and Prediabetes   ?patient will work with SW to address concerns related to costs of heating as well as to better understand what an Advanced Directive is ?SW Interventions:  ?Inter-disciplinary care team collaboration (see longitudinal plan of care) ?Collaboration with Ruth Brine, FNP regarding development and update of comprehensive plan of care as evidenced by provider attestation and co-signature ?Telephonic visit completed with the patient to assess for care coordination needs ?Determined the patient has difficulty affording heating costs and is currently on a payment plan ?Verbal and written education provided to the patient about LIEAP (low income energy assistance program) as an option for the patient to use next winter season ?Discussed the patient does not currently have an Advanced Directive but may be interested in completing one; information mailed ?Patient reports her children assist with care needs when needed, no other care coordination needs identified at this time ?Discussed plan for patient to work with SW to address resource needs while patient also engages with  RN Case Manager  to address clinical needs ?Scheduled follow up call over the next month ?Collaboration with [name] to review interventions and plan ? ?Patient Goals/Self-Care Activities ?patient will:  ? -  Reviewed mailed resources ?-Engage with RN Care Manager to develop an individualized  plan of care ?-Contact SW as needed prior to next scheduled call ? ?Follow Up Plan: The care management team will reach out to the patient again over the next 30 days.  ? ?  ?  ? ?Follow Up Plan: SW will follow up with patient by phone over the next month ?     ?[name], BSW, CDP ?Social Worker, Certified Dementia Practitioner ?[facility] ?[phone] ? ?   ? ? ?

## 2022-01-06 NOTE — Patient Instructions (Signed)
Visit Information ? ?Thank you for taking time to visit with me today. Please don't hesitate to contact me if I can be of assistance to you before our next scheduled telephone appointment. ? ?Following are the goals we discussed today:  ?Patient Goals/Self-Care Activities ?patient will:  ? - Reviewed mailed resources ?-Engage with RN Care Manager to develop an individualized plan of care ?-Contact SW as needed prior to next scheduled call ? ?Our next appointment is by telephone on 02/03/22 ? ?Please call the care guide team at [phone] if you need to cancel or reschedule your appointment.  ? ?If you are experiencing a Mental Health or [place] or need someone to talk to, please call the Suicide and Crisis Lifeline: 988  ? ?Following is a copy of your full plan of care:  ?Care Plan : Social Work Plan of Care  ?Updates made by Daneen Schick since 01/06/2022 12:00 AM  ?  ? ?Problem: Quality of Life (General Plan of Care)   ?  ? ?Goal: Quality of Life Maintained   ?Start Date: 01/06/2022  ?Priority: High  ?Note:   ?Current Barriers:  ?Chronic disease management support and education needs related to HTN and Prediabetes   ?Financial constraints related to costs of heating home ?Does not have an Advanced Directive ? ?Social Worker Clinical Goal(s):  ?patient will work with SW to identify and address any acute and/or chronic care coordination needs related to the self health management of HTN and Prediabetes   ?patient will work with SW to address concerns related to costs of heating as well as to better understand what an Advanced Directive is ?SW Interventions:  ?Inter-disciplinary care team collaboration (see longitudinal plan of care) ?Collaboration with Minette Brine, FNP regarding development and update of comprehensive plan of care as evidenced by provider attestation and co-signature ?Telephonic visit completed with the patient to assess for care coordination needs ?Determined the patient has  difficulty affording heating costs and is currently on a payment plan ?Verbal and written education provided to the patient about LIEAP (low income energy assistance program) as an option for the patient to use next winter season ?Discussed the patient does not currently have an Advanced Directive but may be interested in completing one; information mailed ?Patient reports her children assist with care needs when needed, no other care coordination needs identified at this time ?Discussed plan for patient to work with SW to address resource needs while patient also engages with  RN Case Manager  to address clinical needs ?Scheduled follow up call over the next month ?Collaboration with [place] to review interventions and plan ? ?Patient Goals/Self-Care Activities ?patient will:  ? -  Reviewed mailed resources ?-Engage with RN Care Manager to develop an individualized plan of care ?-Contact SW as needed prior to next scheduled call ? ?Follow Up Plan: The care management team will reach out to the patient again over the next 30 days.  ? ?  ? ? ?[name] was given information about Care Management services by the embedded care coordination team including:  ?Care Management services include personalized support from designated clinical staff supervised by her physician, including individualized plan of care and coordination with other care providers ?24/7 contact phone numbers for assistance for urgent and routine care needs. ?The patient may stop CCM services at any time (effective at the end of the month) by phone call to the office staff. ? ?Patient agreed to services and verbal consent obtained.  ? ?  Patient verbalizes understanding of instructions and care plan provided today and agrees to view in [place]. Active MyChart status confirmed with patient.   ? ?The care management team will reach out to the patient again over the next 30 days.  ? ?Daneen Schick, BSW, CDP ?Social Worker, Certified  Dementia Practitioner ?[facility] ?[phone] ? ?   ? ? ?  ?

## 2022-01-06 NOTE — Chronic Care Management (AMB) (Signed)
?Chronic Care Management  ? ?CCM RN Visit Note ? ?01/06/2022 ?Name: [name] MRN: [id number] DOB: 1950/09/22 ? ?Subjective: ?[name] is a 71 y.o. year old female who is a primary care patient of Minette Brine, [place]. The care management team was consulted for assistance with disease management and care coordination needs.   ? ?Collaboration with Daneen Schick BSW  for  initiation of care plan for chronic disease states  in response to provider referral for case management and/or care coordination services.  ? ?Consent to Services:  ?The patient was given the following information about Chronic Care Management services today, agreed to services, and gave verbal consent: 1. CCM service includes personalized support from designated clinical staff supervised by the primary care provider, including individualized plan of care and coordination with other care providers 2. 24/7 contact phone numbers for assistance for urgent and routine care needs. 3. Service will only be billed when office clinical staff spend 20 minutes or more in a month to coordinate care. 4. Only one practitioner may furnish and bill the service in a calendar month. 5.The patient may stop CCM services at any time (effective at the end of the month) by phone call to the office staff. 6. The patient will be responsible for cost sharing (co-pay) of up to 20% of the service fee (after annual deductible is met). Patient agreed to services and consent obtained. ? ?Patient agreed to services and verbal consent obtained.  ? ?Assessment: Review of patient past medical history, allergies, medications, health status, including review of consultants reports, laboratory and other test data, was performed as part of comprehensive evaluation and provision of chronic care management services.  ? ?SDOH (Social Determinants of Health) assessments and interventions performed:   ? ?CCM Care Plan ? ?No Known Allergies ? ?Outpatient Encounter  Medications as of 01/06/2022  ?Medication Sig  ? acetaminophen (TYLENOL) 500 MG tablet Take 1,000 mg by mouth every 6 (six) hours as needed for mild pain or moderate pain.  ? gabapentin (NEURONTIN) 300 MG capsule Take 1 capsule (300 mg total) by mouth at bedtime.  ? hydroxypropyl methylcellulose / hypromellose (ISOPTO TEARS / GONIOVISC) 2.5 % ophthalmic solution Place 1 drop into both eyes daily as needed for dry eyes.  ? Liniments (SALONPAS ARTHRITIS PAIN RELIEF EX) Apply 1 application topically daily as needed (knee pain).  ? lisinopril-hydrochlorothiazide (ZESTORETIC) 20-12.5 MG tablet TAKE 1/2 TABLET BY MOUTH DAILY  ? LUMIGAN 0.01 % SOLN   ? Multiple Vitamins-Minerals (AIRBORNE PO) Take 1 tablet by mouth 2 (two) times a week.  ? ursodiol (ACTIGALL) 500 MG tablet Take 500 mg by mouth 3 (three) times daily.   ? [DISCONTINUED] prochlorperazine (COMPAZINE) 10 MG tablet Take 1 tablet (10 mg total) by mouth every 6 (six) hours as needed (Nausea or vomiting).  ? ?No facility-administered encounter medications on file as of 01/06/2022.  ? ? ?Patient Active Problem List  ? Diagnosis Date Noted  ? Port-A-Cath in place 03/27/2020  ? Recurrent cancer of left breast ([place]) 02/14/2020  ? Recurrent breast adenocarcinoma ([place]) 01/30/2020  ? Essential hypertension 12/07/2019  ? Dyspnea on exertion 11/11/2018  ? Right leg pain 11/11/2018  ? Obesity, morbid, BMI 40.0-49.9 ([place]) 10/11/2015  ? PALB2-related breast cancer ([place]) 10/11/2015  ? Genetic testing 05/24/2015  ? Elevated LFTs 03/08/2015  ? Anemia in neoplastic disease 06/12/2014  ? Hypokalemia 05/29/2014  ? Watery eyes 05/09/2014  ? Malignant neoplasm of colon ([place]) 04/03/2014  ? Family history of malignant neoplasm  of breast 04/03/2014  ? Malignant neoplasm of upper-outer quadrant of left breast in female, estrogen receptor negative ([place]) 03/01/2014  ? ? ?Conditions to be addressed/monitored: Essential hypertension, Prediabetes ? ?Care Plan : RN Care Manager Plan of Care   ?Updates made by Lynne Logan, RN since 01/06/2022 12:00 AM  ?  ? ?Problem: No plan of care established for management of chronic disease states Essential hypertension, Prediabetes   ?Priority: High  ?  ? ?Long-Range Goal: Establishment of care for management of chronic disease states Essential hypertension, Prediabetes   ?Start Date: 01/06/2022  ?Expected End Date: 01/07/2023  ?This Visit's Progress: On track  ?Priority: High  ?Note:   ?Current Barriers:  ?Knowledge Deficits related to plan of care for management of Essential hypertension, Prediabetes  ?Chronic Disease Management support and education needs related to Essential hypertension, Prediabetes  ? ?RNCM Clinical Goal(s):  ?Patient will continue to work with RN Care Manager to address care management and care coordination needs related to  Essential hypertension, Prediabetes as evidenced by adherence to CM Team Scheduled appointments ?work with Education officer, museum to address  related to the management of Limited education about Advanced directives* and Lack of essential utilities - heating resources needed* related to the management of Essential hypertension, Prediabetes as evidenced by review of EMR and patient or social worker report through collaboration with Consulting civil engineer, provider, and care team.  ? ?Interventions: ?1:1 collaboration with primary care provider regarding development and update of comprehensive plan of care as evidenced by provider attestation and co-signature ?Inter-disciplinary care team collaboration (see longitudinal plan of care) ?Evaluation of current treatment plan related to  self management and patient's adherence to plan as established by provider ? ? ?Interdisciplinary Collaboration Interventions:  (Status: New goal.) Long Term Goal   ?Collaborated with BSW to initiate plan of care to address needs related to Limited education about Advanced directives* and Lack of essential utilities - heating resources needed* in patient with   Essential hypertension, Prediabetes ?Collaboration with Minette Brine, FNP regarding development and update of comprehensive plan of care as evidenced by provider attestation and co-signature ?Inter-disciplinary care team collaboration  ? ?Patient Goals/Self-Care Activities: ?Take all medications as prescribed ?Attend all scheduled provider appointments ?Call pharmacy for medication refills 3-7 days in advance of running out of medications ?Perform all self care activities independently  ?Call provider office for new concerns or questions  ?Work with the Education officer, museum to address care coordination needs and will continue to work with the clinical team to address health care and disease management related needs ? ?Follow Up Plan:  Telephone follow up appointment with care management team member scheduled for:  01/15/22   ?  ? ?Barb Merino, RN, BSN, CCM ?Care Management Coordinator ?[facility] [facility]  ?Direct Phone: [phone] ? ? ? ? ? ? ? ?

## 2022-01-15 ENCOUNTER — Telehealth: Payer: Self-pay

## 2022-01-15 ENCOUNTER — Telehealth: Payer: PPO

## 2022-01-15 NOTE — Telephone Encounter (Signed)
?  Care Management  ? ?Follow Up Note ? ? ?01/15/2022 ?Name: Ryelee Albee MRN: [id number] DOB: 24-Aug-1951 ? ? ?Referred by: Minette Brine, FNP ?Reason for referral : Chronic Care Management (RN CM initial telephone outreach ) ? ? ?An unsuccessful telephone outreach was attempted today. The patient was referred to the case management team for assistance with care management and care coordination. Ms. Lucy is unavailable to speak with me due to caring for her mother who was recently discharged home from the hospital.  ? ?Follow Up Plan: Telephone follow up appointment with care management team member scheduled for: 02/11/22 ? ?Barb Merino, RN, BSN, CCM ?Care Management Coordinator ?[facility]  ?Direct Phone: [phone] ? ? ?

## 2022-01-17 ENCOUNTER — Other Ambulatory Visit (HOSPITAL_COMMUNITY): Payer: Self-pay

## 2022-01-23 ENCOUNTER — Inpatient Hospital Stay: Payer: PPO | Attending: Oncology

## 2022-01-23 ENCOUNTER — Other Ambulatory Visit: Payer: Self-pay

## 2022-01-23 VITALS — BP 100/73 | HR 102 | Temp 98.3°F | Resp 18

## 2022-01-23 DIAGNOSIS — Z95828 Presence of other vascular implants and grafts: Secondary | ICD-10-CM

## 2022-01-23 DIAGNOSIS — Z79899 Other long term (current) drug therapy: Secondary | ICD-10-CM | POA: Diagnosis not present

## 2022-01-23 DIAGNOSIS — Z5111 Encounter for antineoplastic chemotherapy: Secondary | ICD-10-CM | POA: Insufficient documentation

## 2022-01-23 DIAGNOSIS — C50812 Malignant neoplasm of overlapping sites of left female breast: Secondary | ICD-10-CM | POA: Diagnosis not present

## 2022-01-23 DIAGNOSIS — C773 Secondary and unspecified malignant neoplasm of axilla and upper limb lymph nodes: Secondary | ICD-10-CM | POA: Insufficient documentation

## 2022-01-23 DIAGNOSIS — C50919 Malignant neoplasm of unspecified site of unspecified female breast: Secondary | ICD-10-CM

## 2022-01-23 DIAGNOSIS — Z171 Estrogen receptor negative status [ER-]: Secondary | ICD-10-CM

## 2022-01-23 MED ORDER — FULVESTRANT 250 MG/5ML IM SOSY
500.0000 mg | PREFILLED_SYRINGE | Freq: Once | INTRAMUSCULAR | Status: AC
  Administered 2022-01-23: 500 mg via INTRAMUSCULAR
  Filled 2022-01-23: qty 10

## 2022-02-03 ENCOUNTER — Ambulatory Visit: Payer: PPO

## 2022-02-03 ENCOUNTER — Telehealth: Payer: Self-pay

## 2022-02-03 DIAGNOSIS — I10 Essential (primary) hypertension: Secondary | ICD-10-CM

## 2022-02-03 DIAGNOSIS — R7303 Prediabetes: Secondary | ICD-10-CM

## 2022-02-03 NOTE — Telephone Encounter (Signed)
  Care Management   Follow Up Note   02/03/2022 Name: [name] MRN: [id number] DOB: 27-Sep-1950   Referred by: Minette Brine, FNP Reason for referral : Care Coordination (Unsuccessful call)   An unsuccessful telephone outreach was attempted today. The patient was referred to the case management team for assistance with care management and care coordination.   Follow Up Plan: The care management team will reach out to the patient again over the next 21 days.   Daneen Schick, BSW, CDP Social Worker, Certified Dementia Practitioner [place] [facility] [phone]

## 2022-02-03 NOTE — Chronic Care Management (AMB) (Signed)
Social Work Note  02/03/2022 Name: Alizah Sills MRN: [id number] DOB: [birth year]  [name] is a 71 y.o. year old female who is a primary care patient of Minette Brine, [place].  The Care Management team was consulted for assistance with chronic disease management and care coordination needs.  Ms. Kinnaird was given information about Care Management services today including:  Care Management services include personalized support from designated clinical staff supervised by her physician, including individualized plan of care and coordination with other care providers 24/7 contact phone numbers for assistance for urgent and routine care needs. The patient may stop care management services at any time (effective at the end of the month) by phone call to the office staff.  Patient agreed to services and consent obtained.   Engaged with patient by telephone for follow up visit in response to provider referral for social work chronic care management and care coordination services.  Assessment: Review of patient past medical history, allergies, medications, and health status, including review of pertinent consultant reports was performed as part of comprehensive evaluation and provision of care management/care coordination services.   SDOH (Social Determinants of Health) assessments and interventions performed:  No    Advanced Directives Status:  Patient has been provided education and a blank AD packet for completion when she is ready.  Care Plan  No Known Allergies  Outpatient Encounter Medications as of 02/03/2022  Medication Sig   acetaminophen (TYLENOL) 500 MG tablet Take 1,000 mg by mouth every 6 (six) hours as needed for mild pain or moderate pain.   gabapentin (NEURONTIN) 300 MG capsule Take 1 capsule (300 mg total) by mouth at bedtime.   hydroxypropyl methylcellulose / hypromellose (ISOPTO TEARS / GONIOVISC) 2.5 % ophthalmic solution Place 1 drop into both eyes daily as  needed for dry eyes.   Liniments (SALONPAS ARTHRITIS PAIN RELIEF EX) Apply 1 application topically daily as needed (knee pain).   lisinopril-hydrochlorothiazide (ZESTORETIC) 20-12.5 MG tablet TAKE 1/2 TABLET BY MOUTH DAILY   LUMIGAN 0.01 % SOLN    Multiple Vitamins-Minerals (AIRBORNE PO) Take 1 tablet by mouth 2 (two) times a week.   ursodiol (ACTIGALL) 500 MG tablet Take 500 mg by mouth 3 (three) times daily.    [DISCONTINUED] prochlorperazine (COMPAZINE) 10 MG tablet Take 1 tablet (10 mg total) by mouth every 6 (six) hours as needed (Nausea or vomiting).   No facility-administered encounter medications on file as of 02/03/2022.    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   Genetic testing 05/24/2015   Elevated LFTs 03/08/2015   Anemia in neoplastic disease 06/12/2014   Hypokalemia 05/29/2014   Watery eyes 05/09/2014   Malignant neoplasm of colon ([place]) 04/03/2014   Family history of malignant neoplasm of breast 04/03/2014   Malignant neoplasm of upper-outer quadrant of left breast in female, estrogen receptor negative ([place]) 03/01/2014    Conditions to be addressed/monitored: HTN and Prediabetes ; Financial constraints related to costs of heating home and Advance Directive education  Care Plan : Social Work Plan of Care  Updates made by Daneen Schick since 02/03/2022 12:00 AM  Completed 02/03/2022   Problem: Quality of Life (General Plan  of Care) Resolved 02/03/2022     Goal: Quality of Life Maintained Completed 02/03/2022  Start Date: 01/06/2022  Priority: High  Note:   Current Barriers:  Chronic disease management support and education needs  related to HTN and Prediabetes   Financial constraints related to costs of heating home Does not have an Systems analyst):  patient will work with SW to identify and address any acute and/or chronic care coordination needs related to the self health management of HTN and Prediabetes   patient will work with SW to address concerns related to costs of heating as well as to better understand what an Scientist, physiological is SW Interventions:  Inter-disciplinary care team collaboration (see longitudinal plan of care) Collaboration with Minette Brine, FNP regarding development and update of comprehensive plan of care as evidenced by provider attestation and co-signature Telephonic visit completed with the patient to assess goal progression Confirmed receipt of mailed resources, patient states her mother has moved in with her so she has not been able to review resources Discussed plan for patient to contact SW as needed when she begins reviewing resources if she needs SW assistance Goal closed  Patient Goals/Self-Care Activities patient will:   -  Reviewed mailed resources -Engage with RN Care Manager to develop an individualized plan of care -Contact SW as needed         Follow Up Plan:  No SW follow up planned at this time. The patient will contact SW as needed.       Daneen Schick, BSW, CDP Social Worker, Certified Dementia Practitioner [place] [facility] [phone]

## 2022-02-03 NOTE — Patient Instructions (Signed)
Visit Information  Thank you for taking time to visit with me today. Please don't hesitate to contact me if I can be of assistance to you before our next scheduled telephone appointment.  Following are the goals we discussed today:  Patient Goals/Self-Care Activities patient will:   - Reviewed mailed resources -Engage with RN Care Manager to develop an individualized plan of care -Contact SW as needed    Please call the care guide team at [phone] if you need to schedule an appointment with me  If you are experiencing a Mental Health or [place] or need someone to talk to, please go to [facility] [address], [place] [phone])   Patient verbalizes understanding of instructions and care plan provided today and agrees to view in [place]. Active MyChart status and patient understanding of how to access instructions and care plan via MyChart confirmed with patient.     No follow up planned at this time. Please call me as needed.  Daneen Schick, BSW, CDP Social Worker, Certified Dementia Practitioner [place] [facility] [phone]

## 2022-02-05 ENCOUNTER — Other Ambulatory Visit: Payer: Self-pay | Admitting: Nurse Practitioner

## 2022-02-05 ENCOUNTER — Other Ambulatory Visit: Payer: Self-pay | Admitting: Hematology and Oncology

## 2022-02-05 DIAGNOSIS — Z171 Estrogen receptor negative status [ER-]: Secondary | ICD-10-CM

## 2022-02-05 DIAGNOSIS — C50412 Malignant neoplasm of upper-outer quadrant of left female breast: Secondary | ICD-10-CM

## 2022-02-05 DIAGNOSIS — Z1501 Genetic susceptibility to malignant neoplasm of breast: Secondary | ICD-10-CM

## 2022-02-05 DIAGNOSIS — C50919 Malignant neoplasm of unspecified site of unspecified female breast: Secondary | ICD-10-CM

## 2022-02-05 DIAGNOSIS — Z1231 Encounter for screening mammogram for malignant neoplasm of breast: Secondary | ICD-10-CM

## 2022-02-05 DIAGNOSIS — C50912 Malignant neoplasm of unspecified site of left female breast: Secondary | ICD-10-CM

## 2022-02-06 DIAGNOSIS — H43813 Vitreous degeneration, bilateral: Secondary | ICD-10-CM | POA: Diagnosis not present

## 2022-02-06 DIAGNOSIS — H401111 Primary open-angle glaucoma, right eye, mild stage: Secondary | ICD-10-CM | POA: Diagnosis not present

## 2022-02-06 DIAGNOSIS — H402223 Chronic angle-closure glaucoma, left eye, severe stage: Secondary | ICD-10-CM | POA: Diagnosis not present

## 2022-02-06 DIAGNOSIS — H25813 Combined forms of age-related cataract, bilateral: Secondary | ICD-10-CM | POA: Diagnosis not present

## 2022-02-11 ENCOUNTER — Ambulatory Visit: Payer: Self-pay

## 2022-02-11 ENCOUNTER — Telehealth: Payer: Self-pay

## 2022-02-11 ENCOUNTER — Telehealth: Payer: PPO

## 2022-02-11 DIAGNOSIS — I10 Essential (primary) hypertension: Secondary | ICD-10-CM

## 2022-02-11 DIAGNOSIS — C50912 Malignant neoplasm of unspecified site of left female breast: Secondary | ICD-10-CM

## 2022-02-11 DIAGNOSIS — R7303 Prediabetes: Secondary | ICD-10-CM

## 2022-02-11 NOTE — Chronic Care Management (AMB) (Signed)
Care Management    RN Visit Note  02/11/2022 Name: [name] MRN: [id number] DOB: 04-06-1951  Subjective: [name] is a 71 y.o. year old female who is a primary care patient of [name], [name]. The care management team was consulted for assistance with disease management and care coordination needs.    Engaged with patient by telephone for follow up visit in response to provider referral for case management and/or care coordination services.   Consent to Services:   [name] was given information about Care Management services today including:  Care Management services includes personalized support from designated clinical staff supervised by her physician, including individualized plan of care and coordination with other care providers 24/7 contact phone numbers for assistance for urgent and routine care needs. The patient may stop case management services at any time by phone call to the office staff.  Patient agreed to services and consent obtained.   Assessment: Review of patient past medical history, allergies, medications, health status, including review of consultants reports, laboratory and other test data, was performed as part of comprehensive evaluation and provision of chronic care management services.   SDOH (Social Determinants of Health) assessments and interventions performed:  Yes, financial restraints, pharm D sent   Care Plan  No Known Allergies  Outpatient Encounter Medications as of 02/11/2022  Medication Sig Note   acetaminophen (TYLENOL) 500 MG tablet Take 1,000 mg by mouth every 6 (six) hours as needed for mild pain or moderate pain.    gabapentin (NEURONTIN) 300 MG capsule Take 1 capsule (300 mg total) by mouth at bedtime.    hydroxypropyl methylcellulose / hypromellose (ISOPTO TEARS / GONIOVISC) 2.5 % ophthalmic solution Place 1 drop into both eyes daily as needed for dry eyes.    Liniments (SALONPAS ARTHRITIS PAIN RELIEF EX)  Apply 1 application topically daily as needed (knee pain).    lisinopril-hydrochlorothiazide (ZESTORETIC) 20-12.5 MG tablet TAKE 1/2 TABLET BY MOUTH DAILY    LUMIGAN 0.01 % SOLN     Multiple Vitamins-Minerals (AIRBORNE PO) Take 1 tablet by mouth 2 (two) times a week. 02/11/2022: Vitafusion   ursodiol (ACTIGALL) 500 MG tablet Take 500 mg by mouth 3 (three) times daily.     [DISCONTINUED] prochlorperazine (COMPAZINE) 10 MG tablet Take 1 tablet (10 mg total) by mouth every 6 (six) hours as needed (Nausea or vomiting).    No facility-administered encounter medications on file as of 02/11/2022.    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   Genetic testing 05/24/2015   Elevated LFTs 03/08/2015   Anemia in neoplastic disease 06/12/2014   Hypokalemia 05/29/2014   Watery eyes 05/09/2014   Malignant neoplasm of colon ([place]) 04/03/2014   Family history of malignant neoplasm of breast 04/03/2014   Malignant neoplasm of upper-outer quadrant of left breast in female, estrogen receptor negative ([place]) 03/01/2014    Conditions to be addressed/monitored:  HTN, Prediabetes; Recurrent cancer of left breast  Care Plan : RN Care Manager Plan of Care  Updates made by Ruth Logan, RN since 02/11/2022 12:00 AM     Problem: No plan of care established for management of chronic disease states Essential  hypertension, Prediabetes   Priority: High     Long-Range Goal: Establishment of care for management of chronic disease states Essential hypertension, Prediabetes   Start Date: 01/06/2022  Expected End Date: 01/07/2023  Recent Progress: On track  Priority: High  Note:    Current Barriers:  Knowledge Deficits related to plan of care for management of Essential hypertension, Prediabetes  Chronic Disease Management support and education needs related to Essential hypertension, Prediabetes   RNCM Clinical Goal(s):  Patient will continue to work with RN Care Manager to address care management and care coordination needs related to  Essential hypertension, Prediabetes as evidenced by adherence to CM Team Scheduled appointments work with Education officer, museum to address  related to the management of Limited education about Advanced directives* and Lack of essential utilities - heating resources needed* related to the management of Essential hypertension, Prediabetes as evidenced by review of EMR and patient or Education officer, museum report through collaboration with Consulting civil engineer, provider, and care team.   Interventions: 1:1 collaboration with primary care provider regarding development and update of comprehensive plan of care as evidenced by provider attestation and co-signature Inter-disciplinary care team collaboration (see longitudinal plan of care) Evaluation of current treatment plan related to  self management and patient's adherence to plan as established by provider   Interdisciplinary Collaboration Interventions:  (Status: Goal on track:  Yes.) Long Term Goal   Collaborated with BSW to initiate plan of care to address needs related to Limited education about Advanced directives* and Lack of essential utilities - heating resources needed* in patient with  Essential hypertension, Prediabetes Collaboration with Ruth Brine, FNP regarding development and update of comprehensive plan of care as evidenced by provider attestation and co-signature Inter-disciplinary care team collaboration   Prediabetes Interventions:  (Status:  New goal.) Long Term Goal Assessed patient's understanding of A1c goal:  <5.7 % Provided education to patient about basic DM disease process Review  of patient status, including review of consultants reports, relevant laboratory and other test results, and medications completed Educated patient on the dietary and exercise recommendations Mailed printed educational materials related to What is Prediabetes; Chair Exercises; Meal Planning using the Plate Method; Carb Choice list  Lab Results  Component Value Date   HGBA1C 5.9 (H) 12/18/2021   Glaucoma Interventions:  (Status:  New goal.)  Long Term Goal Evaluation of current treatment plan related to  Glaucoma ,  self-management and patient's adherence to plan as established by provider Assessed for patient understanding of her eye disease, reports this condition is stable Review of patient status, including review of consultant's reports, relevant laboratory and other test results, and medications completed Reviewed medications with patient and discussed importance of medication adherence Determined patient is having financial difficulty paying for several of her eye drops, she is getting samples from prescribing eye specialist  Sent embedded Pharm D referral to assist with cost of eye drops, sent in basket message with details  Discussed plans with patient for ongoing care management follow up and provided patient with direct contact information for care management team  Hypertension Interventions:  (Status:  New goal.) Long Term Goal Last practice recorded BP readings:  BP Readings from Last 3 Encounters:  01/23/22 100/73  12/26/21 139/83  12/18/21 110/64  Most recent eGFR/CrCl:  Lab Results  Component Value Date   EGFR 66 12/18/2021    No components found for: CRCL Evaluation of current treatment plan related to hypertension self management and patient's adherence to plan as established by  provider Counseled on the importance of exercise goals with target of 150 minutes per week Advised patient, providing education and rationale, to monitor blood pressure daily and record, calling  PCP for findings outside established parameters Provided education on prescribed diet low Sodium Discussed complications of poorly controlled blood pressure such as heart disease, stroke, circulatory complications, vision complications, kidney impairment, sexual dysfunction  Oncology:  (Status: New goal.) Long Term Goal Assessment of understanding of oncology diagnosis:  Assessed patient understanding of cancer diagnosis and recommended treatment plan, Reviewed upcoming provider appointments and treatment appointments, Assessed available transportation to appointments and treatments. Has consistent/reliable transportation: Yes, and Assessed support system. Has consistent/reliable family or other support: Yes Determined patient no longer has a port a cath, she is receiving monthly Chemo injections, tolerating well, reports fatigue is persistent but has not worsened Educated patient on ways to preserve energy, completing tasks during most energetic time of day, balancing activities with rest, staying well hydrated, eating well balanced diet and getting plenty of uninterrupted sleep Instructed patient to keep her health care team well informed of worsening concerns or change in condition  Discussed plans with patient for ongoing care management follow up and provided patient with direct contact information for care management team   Patient Goals/Self-Care Activities: Take all medications as prescribed Attend all scheduled provider appointments Call pharmacy for medication refills 3-7 days in advance of running out of medications Perform all self care activities independently  Call provider office for new concerns or questions  Work with the social worker to address care coordination needs and will continue to work with the clinical team to address health care and disease management related needs drink 6 to 8 glasses of water each day eat fish at least once per week fill half of plate with  vegetables manage portion size check blood pressure 3 times per week write blood pressure results in a log or diary take blood pressure log to all doctor appointments take medications for blood pressure exactly as prescribed begin an exercise program  Follow Up Plan:  Telephone follow up appointment with care management team member scheduled for:  05/09/22      Ruth Merino, RN, BSN, CCM Care Management Coordinator [place] [facility]  Direct Phone: [phone]

## 2022-02-11 NOTE — Patient Instructions (Signed)
Visit Information  Thank you for taking time to visit with me today. Please don't hesitate to contact me if I can be of assistance to you before our next scheduled telephone appointment.  Following are the goals we discussed today:  (Copy and paste patient goals from clinical care plan here)  Our next appointment is by telephone on 05/09/22 at 10:30 AM   Please call the care guide team at [phone] if you need to cancel or reschedule your appointment.   If you are experiencing a Mental Health or [place] or need someone to talk to, please call 1-800-273-TALK (toll free, 24 hour hotline)   Patient verbalizes understanding of instructions and care plan provided today and agrees to view in [place]. Active MyChart status and patient understanding of how to access instructions and care plan via MyChart confirmed with patient.     Barb Merino, RN, BSN, CCM Care Management Coordinator [place] [facility]  Direct Phone: [phone]

## 2022-02-11 NOTE — Chronic Care Management (AMB) (Cosign Needed)
    Chronic Care Management Pharmacy Assistant   Name: [name]  MRN: [id number] DOB: 1950/12/16  Reason for Encounter: PAP   02-11-2022: Patient is in need of assistance for ursodiol, lumigan eye drops and hydroxypropyl drops. Informed patient that ursodiol doesn't have a savings program and to inform Dr. Collene Mares for other options. No saving program for Hydroxypropyl drops so patient will ask for an alternative from provider. Lumigan application completed and uploaded to mail.  Medications: Outpatient Encounter Medications as of 02/11/2022  Medication Sig Note   acetaminophen (TYLENOL) 500 MG tablet Take 1,000 mg by mouth every 6 (six) hours as needed for mild pain or moderate pain.    gabapentin (NEURONTIN) 300 MG capsule Take 1 capsule (300 mg total) by mouth at bedtime.    hydroxypropyl methylcellulose / hypromellose (ISOPTO TEARS / GONIOVISC) 2.5 % ophthalmic solution Place 1 drop into both eyes daily as needed for dry eyes.    Liniments (SALONPAS ARTHRITIS PAIN RELIEF EX) Apply 1 application topically daily as needed (knee pain).    lisinopril-hydrochlorothiazide (ZESTORETIC) 20-12.5 MG tablet TAKE 1/2 TABLET BY MOUTH DAILY    LUMIGAN 0.01 % SOLN     Multiple Vitamins-Minerals (AIRBORNE PO) Take 1 tablet by mouth 2 (two) times a week. 02/11/2022: Vitafusion   ursodiol (ACTIGALL) 500 MG tablet Take 500 mg by mouth 3 (three) times daily.     [DISCONTINUED] prochlorperazine (COMPAZINE) 10 MG tablet Take 1 tablet (10 mg total) by mouth every 6 (six) hours as needed (Nausea or vomiting).    No facility-administered encounter medications on file as of 02/11/2022.    [place] Pharmacist Assistant [phone]

## 2022-02-17 ENCOUNTER — Telehealth: Payer: PPO

## 2022-02-20 ENCOUNTER — Other Ambulatory Visit: Payer: Self-pay

## 2022-02-20 ENCOUNTER — Inpatient Hospital Stay: Payer: PPO | Attending: Oncology

## 2022-02-20 VITALS — BP 129/88 | HR 106 | Temp 98.1°F | Resp 20

## 2022-02-20 DIAGNOSIS — Z171 Estrogen receptor negative status [ER-]: Secondary | ICD-10-CM

## 2022-02-20 DIAGNOSIS — Z79899 Other long term (current) drug therapy: Secondary | ICD-10-CM | POA: Insufficient documentation

## 2022-02-20 DIAGNOSIS — C50919 Malignant neoplasm of unspecified site of unspecified female breast: Secondary | ICD-10-CM

## 2022-02-20 DIAGNOSIS — C773 Secondary and unspecified malignant neoplasm of axilla and upper limb lymph nodes: Secondary | ICD-10-CM | POA: Insufficient documentation

## 2022-02-20 DIAGNOSIS — Z5111 Encounter for antineoplastic chemotherapy: Secondary | ICD-10-CM | POA: Diagnosis not present

## 2022-02-20 DIAGNOSIS — C50812 Malignant neoplasm of overlapping sites of left female breast: Secondary | ICD-10-CM | POA: Insufficient documentation

## 2022-02-20 DIAGNOSIS — Z95828 Presence of other vascular implants and grafts: Secondary | ICD-10-CM

## 2022-02-20 MED ORDER — FULVESTRANT 250 MG/5ML IM SOSY
500.0000 mg | PREFILLED_SYRINGE | Freq: Once | INTRAMUSCULAR | Status: AC
  Administered 2022-02-20: 500 mg via INTRAMUSCULAR
  Filled 2022-02-20: qty 10

## 2022-03-04 ENCOUNTER — Ambulatory Visit
Admission: RE | Admit: 2022-03-04 | Discharge: 2022-03-04 | Disposition: A | Discharge status: Home / Self Care | Payer: PPO | Source: Ambulatory Visit | Attending: Hematology and Oncology | Admitting: Hematology and Oncology

## 2022-03-04 DIAGNOSIS — Z1231 Encounter for screening mammogram for malignant neoplasm of breast: Secondary | ICD-10-CM

## 2022-03-19 ENCOUNTER — Other Ambulatory Visit: Payer: Self-pay | Admitting: *Deleted

## 2022-03-19 DIAGNOSIS — Z171 Estrogen receptor negative status [ER-]: Secondary | ICD-10-CM

## 2022-03-20 ENCOUNTER — Inpatient Hospital Stay: Payer: PPO | Attending: Oncology

## 2022-03-20 ENCOUNTER — Ambulatory Visit: Payer: PPO

## 2022-03-20 ENCOUNTER — Inpatient Hospital Stay: Payer: PPO

## 2022-03-20 ENCOUNTER — Inpatient Hospital Stay (HOSPITAL_BASED_OUTPATIENT_CLINIC_OR_DEPARTMENT_OTHER): Payer: PPO | Admitting: Hematology and Oncology

## 2022-03-20 ENCOUNTER — Ambulatory Visit: Payer: PPO | Admitting: Hematology and Oncology

## 2022-03-20 ENCOUNTER — Other Ambulatory Visit: Payer: Self-pay

## 2022-03-20 ENCOUNTER — Encounter: Payer: Self-pay | Admitting: Hematology and Oncology

## 2022-03-20 ENCOUNTER — Other Ambulatory Visit: Payer: PPO

## 2022-03-20 VITALS — BP 138/92 | HR 96 | Temp 97.9°F | Resp 16 | Ht 61.0 in | Wt 217.6 lb

## 2022-03-20 DIAGNOSIS — Z171 Estrogen receptor negative status [ER-]: Secondary | ICD-10-CM

## 2022-03-20 DIAGNOSIS — C50412 Malignant neoplasm of upper-outer quadrant of left female breast: Secondary | ICD-10-CM

## 2022-03-20 DIAGNOSIS — Z79899 Other long term (current) drug therapy: Secondary | ICD-10-CM | POA: Diagnosis not present

## 2022-03-20 DIAGNOSIS — C50812 Malignant neoplasm of overlapping sites of left female breast: Secondary | ICD-10-CM | POA: Diagnosis not present

## 2022-03-20 DIAGNOSIS — C773 Secondary and unspecified malignant neoplasm of axilla and upper limb lymph nodes: Secondary | ICD-10-CM | POA: Diagnosis not present

## 2022-03-20 DIAGNOSIS — C50919 Malignant neoplasm of unspecified site of unspecified female breast: Secondary | ICD-10-CM

## 2022-03-20 DIAGNOSIS — Z95828 Presence of other vascular implants and grafts: Secondary | ICD-10-CM

## 2022-03-20 DIAGNOSIS — Z5111 Encounter for antineoplastic chemotherapy: Secondary | ICD-10-CM | POA: Insufficient documentation

## 2022-03-20 LAB — CMP (CANCER CENTER ONLY)
ALT: 14 U/L (ref 0–44)
AST: 13 U/L — ABNORMAL LOW (ref 15–41)
Albumin: 4 g/dL (ref 3.5–5.0)
Alkaline Phosphatase: 130 U/L — ABNORMAL HIGH (ref 38–126)
Anion gap: 7 (ref 5–15)
BUN: 15 mg/dL (ref 8–23)
CO2: 29 mmol/L (ref 22–32)
Calcium: 10.1 mg/dL (ref 8.9–10.3)
Chloride: 105 mmol/L (ref 98–111)
Creatinine: 0.98 mg/dL (ref 0.44–1.00)
GFR, Estimated: >60 mL/min (ref >60)
Glucose, Bld: 98 mg/dL (ref 70–99)
Potassium: 3.8 mmol/L (ref 3.5–5.1)
Sodium: 141 mmol/L (ref 135–145)
Total Bilirubin: 1.1 mg/dL (ref 0.3–1.2)
Total Protein: 7.5 g/dL (ref 6.5–8.1)

## 2022-03-20 LAB — CBC WITH DIFFERENTIAL (CANCER CENTER ONLY)
Abs Immature Granulocytes: 0.01 10*3/uL (ref 0.00–0.07)
Basophils Absolute: 0 10*3/uL (ref 0.0–0.1)
Basophils Relative: 1 %
Eosinophils Absolute: 0.1 10*3/uL (ref 0.0–0.5)
Eosinophils Relative: 2 %
HCT: 35.6 % — ABNORMAL LOW (ref 36.0–46.0)
Hemoglobin: 11.8 g/dL — ABNORMAL LOW (ref 12.0–15.0)
Immature Granulocytes: 0 %
Lymphocytes Relative: 31 %
Lymphs Abs: 1.8 10*3/uL (ref 0.7–4.0)
MCH: 30.1 pg (ref 26.0–34.0)
MCHC: 33.1 g/dL (ref 30.0–36.0)
MCV: 90.8 fL (ref 80.0–100.0)
Monocytes Absolute: 0.4 10*3/uL (ref 0.1–1.0)
Monocytes Relative: 7 %
Neutro Abs: 3.4 10*3/uL (ref 1.7–7.7)
Neutrophils Relative %: 59 %
Platelet Count: 293 10*3/uL (ref 150–400)
RBC: 3.92 MIL/uL (ref 3.87–5.11)
RDW: 12.7 % (ref 11.5–15.5)
WBC Count: 5.8 10*3/uL (ref 4.0–10.5)
nRBC: 0 % (ref 0.0–0.2)

## 2022-03-20 MED ORDER — FULVESTRANT 250 MG/5ML IM SOSY
500.0000 mg | PREFILLED_SYRINGE | Freq: Once | INTRAMUSCULAR | Status: AC
  Administered 2022-03-20: 500 mg via INTRAMUSCULAR
  Filled 2022-03-20: qty 10

## 2022-03-20 NOTE — Progress Notes (Addendum)
[place]  [phone] [phone]) [phone]    ID: [name] DOB: 11/30/1950  MR#: [id number]  XTA#:[id number]  Patient Care Team: Minette Brine, [place] as PCP - General (General Practice) Magrinat, Virgie Dad, MD (Inactive) as Consulting Physician (Oncology) Rolm Bookbinder, MD as Consulting Physician (General Surgery) Gery Pray, MD as Consulting Physician (Radiation Oncology) [name], MD as Consulting Physician (Ophthalmology) Rex Kras, Claudette Stapler, RN as Iron Management   CHIEF COMPLAINT: triple negative breast cancer; PALB2 positive (s/p left mastectomy)  CURRENT TREATMENT: fulvestrant  INTERVAL HISTORY:  [name] returns today for follow up and treatment of her recurrent left breast cancer.  She continues on fulvestrant.  She tolerates this with no side effects that she is aware of.   Initial plan was to consider Ibrance and antiestrogen therapy after surgery however since she was found to have an Oncotype of 60, she received adjuvant chemo and it was also noted on the Oncotype that the profile was consistent with triple negative breast cancer.  Pathology from the surgery showed 70% moderate staining hence Dr. Jana Hakim has discussed about considering Faslodex alone for 5 years. Since last visit she had a mammogram which was unremarkable.  She has not noticed any changes.  She denies any changes in breathing, new bone pains, change in bowel habits or urinary habits.  Rest of the pertinent 10 point ROS reviewed and negative   COVID 19 VACCINATION STATUS: fully vaccinated [name]), with booster 11/2020   BREAST CANCER HISTORY: From the original intake note:  [name] herself palpated a mass in her left breast, and immediately brought it to the attention of her primary care physician, Dr. Baird Cancer, who confirmed the finding and setup the patient for bilateral diagnostic mammography [facility] 02/20/2014. This showed  a high density mass in the outer left breast measuring up to 3.7 cm. It corresponded to the site of palpable concern. In addition the normal 4 logically abnormal lymph nodes in the left axilla. The mass was palpable by exam, and by ultrasonography measured 2.6 cm. There were enlarged and morphologically abnormal lymph nodes in the left axilla, largest measuring 3 cm.  Biopsy of the left breast mass in question and 1 of the abnormal axillary lymph nodes 02/22/2014 showed ([id number]) biopsies to be positive for an invasive ductal carcinoma, grade 3, triple negative, with an MIB-1 of 90%. Specifically the HER-2 signals ratio was 1.05, and the number per cell 2.00.  MRI of the breasts 03/01/2014 showed a 3.8 cm enhancing mass with areas of apparent necrosis in the left breast, as well as the biopsy tract extending laterally, the combined measure 5.3 cm. There were no other masses or areas of abnormal enhancement. There were multiple abnormally enlarged left axillary lymph nodes with loss of the normal fatty hilum. These included level I and retropectoral lymph nodes.  The patient's subsequent history is as detailed below   PAST MEDICAL HISTORY: Past Medical History:  Diagnosis Date   Colon cancer ([place])    Hx of rotator cuff surgery    Hypertension    left breast cancer    Personal history of chemotherapy 2016   left   Personal history of radiation therapy 2016   left breast    Radiation 11/15/14-01/02/15   left breast, axillary and supraclavicular region 45 gray, lumpectomy cavity boosted to 16 gray    PAST SURGICAL HISTORY: Past Surgical History:  Procedure Laterality Date   Index  BREAST EXCISIONAL BIOPSY Right 12/21/2017   BREAST LUMPECTOMY Left 2016   COLON SURGERY  1988   colectomy/colostomy-ca   COLON SURGERY  1988   colostomy takedown-reversal   COLONOSCOPY     EXCISION / BIOPSY BREAST / NIPPLE / DUCT Left 09/20/2014   IR IMAGING GUIDED PORT INSERTION   03/22/2020   MASTECTOMY Left 02/14/2020   MASTECTOMY W/ SENTINEL NODE BIOPSY Left 02/14/2020   Procedure: LEFT MASTECTOMY WITH BLUE DYE INJECTION;  Surgeon: Rolm Bookbinder, MD;  Location: [facility];  Service: General;  Laterality: Left;  PEC BLOCK   RADIOACTIVE SEED GUIDED EXCISIONAL BREAST BIOPSY Right 12/21/2017   Procedure: RIGHT RADIOACTIVE SEED GUIDED EXCISIONAL BREAST BIOPSY ERAS PATHWAY;  Surgeon: Rolm Bookbinder, MD;  Location: [facility];  Service: General;  Laterality: Right;   RADIOACTIVE SEED GUIDED PARTIAL MASTECTOMY WITH AXILLARY SENTINEL LYMPH NODE BIOPSY Left 09/20/2014   Procedure: RADIOACTIVE SEED GUIDED LEFT BREAST LUMPECTOMY WITH AXILLARY SENTINEL LYMPH NODE BIOPSY;  Surgeon: Rolm Bookbinder, MD;  Location: [facility];  Service: General;  Laterality: Left;   TOTAL SHOULDER ARTHROPLASTY  2009   right    FAMILY HISTORY Family History  Problem Relation Age of Onset   Cancer Mother 79       breast   Breast cancer Mother        early 50s   Stroke Father 85       cause of death   Diabetes Father    Cancer Maternal Aunt        breast cancer at unknown age   Breast cancer Maternal Aunt    Ovarian cancer Neg Hx    the patient's father died at the age of 2 following a stroke in the setting of diabetes; the patient's mother is living at 64. The patient has 3 brothers and 2 sisters. [name], [name], was diagnosed with breast cancer in her early 89s. There is no other breast or ovarian cancer in the family. The patient herself was diagnosed with watts by history he is an incidentally found stage I colon carcinoma noted at the time of her hysterectomy. She had a partial colectomy but no adjuvant treatment. We have no records regarding that procedure   GYNECOLOGIC HISTORY:  No LMP recorded. Patient has had a hysterectomy. Menarche age 57, first live birth age 55, the patient underwent total abdominal hysterectomy with bilateral  salpingo-oophorectomy in her 74s. She did not take hormone replacement. She did not take oral contraceptives at any point.   SOCIAL HISTORY: (Updated May 2021) [name] worked for [employer] for about 40 years, retiring in 2008. She is divorced and lives alone, with no pets.  Her grandson who is a Conservator, museum/gallery frequently stays with her.  Her daughter [name] works for [employer] in [employer]. The second daughter, [name], works as a Scientist, water quality at [employer].  The patient has 6 grandchildren and three great-grandchildren. She attends a [employer].     ADVANCED DIRECTIVES: Not in place. The patient intends to name her daughter [name]. [name] is work number is [phone]. On the patient's 03/01/2014 visit she was given a copy of the appropriate documents to complete and notarize at her discretion.     HEALTH MAINTENANCE: Social History   Tobacco Use   Smoking status: Never   Smokeless tobacco: Never  Vaping Use   Vaping Use: Never used  Substance Use Topics   Alcohol use: Not Currently  Comment: occ-rare   Drug use: No     Colonoscopy: 2000  PAP: May 2015  Bone density: 12/21/2017, T-score of -0.6  Lipid panel: 02/03/2014  No Known Allergies  Current Outpatient Medications  Medication Sig Dispense Refill   acetaminophen (TYLENOL) 500 MG tablet Take 1,000 mg by mouth every 6 (six) hours as needed for mild pain or moderate pain.     gabapentin (NEURONTIN) 300 MG capsule Take 1 capsule (300 mg total) by mouth at bedtime. 90 capsule 4   hydroxypropyl methylcellulose / hypromellose (ISOPTO TEARS / GONIOVISC) 2.5 % ophthalmic solution Place 1 drop into both eyes daily as needed for dry eyes.     Liniments (SALONPAS ARTHRITIS PAIN RELIEF EX) Apply 1 application topically daily as needed (knee pain).     lisinopril-hydrochlorothiazide (ZESTORETIC) 20-12.5 MG tablet TAKE 1/2 TABLET BY MOUTH DAILY 45 tablet 5   LUMIGAN 0.01 %  SOLN      Multiple Vitamins-Minerals (AIRBORNE PO) Take 1 tablet by mouth 2 (two) times a week.     ursodiol (ACTIGALL) 500 MG tablet Take 500 mg by mouth 3 (three) times daily.      No current facility-administered medications for this visit.    OBJECTIVE: African-American woman who appears older than stated age 33:   03/20/22 1107  BP: (!) 138/92  Pulse: 96  Resp: 16  Temp: 97.9 F (36.6 C)  SpO2: 100%    Filed Weights   03/20/22 1107  Weight: 217 lb 9.6 oz (98.7 kg)    Body mass index is 41.12 kg/m.     ECOG FS:1 - Symptomatic but completely ambulatory   Physical Exam Constitutional:      Appearance: Normal appearance.  Cardiovascular:     Rate and Rhythm: Normal rate and regular rhythm.     Pulses: Normal pulses.     Heart sounds: Normal heart sounds.  Chest:     Comments: Bilateral breasts inspected.  Left breast status postmastectomy.  Some nodularity in the left breast below the mastectomy scar likely fat necrosis.  No other palpable masses or regional adenopathy Abdominal:     General: There is no distension.     Tenderness: There is no abdominal tenderness.  Musculoskeletal:     Cervical back: Normal range of motion and neck supple. No rigidity.  Lymphadenopathy:     Cervical: No cervical adenopathy.  Skin:    General: Skin is warm and dry.  Neurological:     General: No focal deficit present.     Mental Status: She is alert.  Psychiatric:        Mood and Affect: Mood normal.    LAB RESULTS:  No results found for: "SPEP", "UPEP"  Lab Results  Component Value Date   WBC 5.8 03/20/2022   NEUTROABS 3.4 03/20/2022   HGB 11.8 (L) 03/20/2022   HCT 35.6 (L) 03/20/2022   MCV 90.8 03/20/2022   PLT 293 03/20/2022      Chemistry      Component Value Date/Time   NA 141 03/20/2022 1041   NA 140 12/18/2021 1020   NA 142 11/13/2016 1050   K 3.8 03/20/2022 1041   K 4.1 11/13/2016 1050   CL 105 03/20/2022 1041   CO2 29 03/20/2022 1041   CO2 27  11/13/2016 1050   BUN 15 03/20/2022 1041   BUN 17 12/18/2021 1020   BUN 11.5 11/13/2016 1050   CREATININE 0.98 03/20/2022 1041   CREATININE 1.0 11/13/2016 1050  Component Value Date/Time   CALCIUM 10.1 03/20/2022 1041   CALCIUM 9.8 11/13/2016 1050   ALKPHOS 130 (H) 03/20/2022 1041   ALKPHOS 140 11/13/2016 1050   AST 13 (L) 03/20/2022 1041   AST 13 11/13/2016 1050   ALT 14 03/20/2022 1041   ALT 15 11/13/2016 1050   BILITOT 1.1 03/20/2022 1041   BILITOT 0.86 11/13/2016 1050       No results found for: "LABCA2"  No components found for: "LABCA125"  No results for input(s): "INR" in the last 168 hours.  Urinalysis    Component Value Date/Time   COLORURINE YELLOW 04/05/2008 1120   APPEARANCEUR CLEAR 04/05/2008 1120   LABSPEC 1.021 04/05/2008 1120   PHURINE 6.0 04/05/2008 1120   GLUCOSEU NEGATIVE 04/05/2008 1120   HGBUR NEGATIVE 04/05/2008 1120   BILIRUBINUR negative 12/12/2020 1236   KETONESUR NEGATIVE 04/05/2008 1120   PROTEINUR Negative 12/12/2020 1236   PROTEINUR NEGATIVE 04/05/2008 1120   UROBILINOGEN 0.2 12/12/2020 1236   UROBILINOGEN 0.2 04/05/2008 1120   NITRITE negative 12/12/2020 1236   NITRITE NEGATIVE 04/05/2008 1120   LEUKOCYTESUR Negative 12/12/2020 1236    STUDIES: MM 3D SCREEN BREAST UNI RIGHT  Result Date: 03/05/2022 CLINICAL DATA:  Screening. EXAM: DIGITAL SCREENING UNILATERAL RIGHT MAMMOGRAM WITH CAD AND TOMOSYNTHESIS TECHNIQUE: Right screening digital craniocaudal and mediolateral oblique mammograms were obtained. Right screening digital breast tomosynthesis was performed. The images were evaluated with computer-aided detection. COMPARISON:  Previous exam(s). ACR Breast Density Category b: There are scattered areas of fibroglandular density. FINDINGS: There are no findings suspicious for malignancy. Status post LEFT mastectomy. IMPRESSION: No mammographic evidence of malignancy. A result letter of this screening mammogram will be mailed directly to  the patient. RECOMMENDATION: Screening mammogram in one year. (Code:SM-B-01Y) BI-RADS CATEGORY  1: Negative. Electronically Signed   By: Valentino Saxon M.D.   On: 03/05/2022 10:10      ASSESSMENT: 71 y.o. BRCA negative [name] woman s/p left breast upper outer quadrant and left axillary lymph node biopsy 02/22/2014, both positive for a clinical T2 N2, stage IIIA invasive ductal carcinoma, grade 3, triple negative, with an MIB-1 of 90%  (1) neoadjuvant chemotherapy started 03/13/2014, with cyclophosphamide and doxorubicin in dose dense fashion x4, with Neulasta support on day 2, completed 04/24/2014, followed by weekly carboplatin and paclitaxel x12, paclitaxel reduced during cycle 5 and cycle 7 because of elevated bilirubin levels  (2) left lumpectomy and sentinel lymph node sampling 09/20/2014 showed a complete pathologic response.  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  (b) PALB2 mutations are known to increase the risk of breast and pancreatic cancer, possibly other cancers, but the data is preliminary  (c) the patient's 2 daughters have been tested and did not carry the gene  (d) [name]'s MSH2 VUS has been reclassified as "likely benign"  (e) she is status post remote TAH/BSO  (f) intensified screening, with mammography in February and breast MRI in August planned  (6) staging studies showed right-sided lung nodules, too small to characterize, and a dominant right-sided thyroid nodule unchanged in size as compared to 2011; to be followed  (a) chest CT 04/24/2016 showed the small pulmonary nodules to be completely stable, consistent with benign findings.  (b) thyroid  ultrasound--done  on 07/22/2018 showed enlarged heterogeneous lobular and multinodular thyroid gland, TI-RADS category 3 nodules, not meeting criteria for further biopsy or dedicated imaging f/u.    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  (8) anastrozole started 07/08/2018 for breast cancer prevention, discontinued 01/30/2020 with disease recurrence  (a) bone density scan 01/01/2018 normal, T score -0.6  (9) RECURRENT DISEASE May 2021  (a) left breast overlapping biopsy 01/20/2020 shows a clinical  T2 N0, stage IIB invasive ductal carcinoma, grade 2 or 3, with moderate estrogen receptor positivity, progesterone receptor negative, HER-2 not amplified, MIB-1 of 80%  (b) left mastectomy on 02/14/2020 showed a pT2 pNX, invasive ductal carcinoma, grade 3, with negative margins   (c) CT of the chest and bone scan on 02/08/2020 shows no distant metastases  (10) started fulvestrant and palbociclib 02/24/2020 discontinued on 03/08/2020  (a) Oncotype sent on surgical sample shows score of 60 indicating a greater than 29% risk of recurrence on antiestrogens alone, and also predicting a significant benefit from chemotherapy.  (b) the Oncotype test found this tumor to be triple negative by single gene scores.   (11) cyclophosphamide, methotrexate, and fluorouracil [CMF] every 28 days x 8 cycles given from 03/27/2020-08/22/2020.  (12) fulvestrant resumed 10/03/2020   PLAN:  Ms. [name] is now here for for follow-up on adjuvant Faslodex.  She started Faslodex in January 2022.  She is tolerating this very well.  Anticipate completion of 5 years of antiestrogen therapy in January 2027.  Most recent mammogram unremarkable. No findings on physical examination today. I reviewed her oncology course with her today. She does not need any surveillance for thyroid nodules according to her last  imaging I have ordered an ultrasound of the left chest wall, this is most likely fat necrosis however given her recurrent breast cancer in the same area, we will proceed with ultrasound and discuss further recommendations based on imaging Return to clinic in 6 months for further evaluation  Total time spent: 30 minutes  *Total Encounter Time as defined by the Centers for Medicare and Medicaid Services includes, in addition to the face-to-face time of a patient visit (documented in the note above) non-face-to-face time: obtaining and reviewing outside history, ordering and reviewing medications, tests or procedures, care coordination (communications with other health care professionals or caregivers) and documentation in the medical record.

## 2022-03-26 ENCOUNTER — Telehealth: Payer: Self-pay | Admitting: *Deleted

## 2022-03-26 NOTE — Telephone Encounter (Signed)
This RN called [facility] and obtained appt for [place] of left chest wall for 04/01/2022 at 130 pm.  Called and informed pt with pt repeating back appt information.

## 2022-04-01 ENCOUNTER — Ambulatory Visit
Admission: RE | Admit: 2022-04-01 | Discharge: 2022-04-01 | Disposition: A | Discharge status: Home / Self Care | Payer: PPO | Source: Ambulatory Visit | Attending: Hematology and Oncology | Admitting: Hematology and Oncology

## 2022-04-01 ENCOUNTER — Other Ambulatory Visit: Payer: Self-pay | Admitting: Hematology and Oncology

## 2022-04-01 DIAGNOSIS — N6320 Unspecified lump in the left breast, unspecified quadrant: Secondary | ICD-10-CM

## 2022-04-01 DIAGNOSIS — Z171 Estrogen receptor negative status [ER-]: Secondary | ICD-10-CM

## 2022-04-01 DIAGNOSIS — Z853 Personal history of malignant neoplasm of breast: Secondary | ICD-10-CM | POA: Diagnosis not present

## 2022-04-10 ENCOUNTER — Other Ambulatory Visit: Payer: Self-pay | Admitting: Hematology and Oncology

## 2022-04-10 ENCOUNTER — Ambulatory Visit
Admission: RE | Admit: 2022-04-10 | Discharge: 2022-04-10 | Disposition: A | Discharge status: Home / Self Care | Payer: PPO | Source: Ambulatory Visit | Attending: Hematology and Oncology | Admitting: Hematology and Oncology

## 2022-04-10 DIAGNOSIS — N6320 Unspecified lump in the left breast, unspecified quadrant: Secondary | ICD-10-CM

## 2022-04-10 DIAGNOSIS — C50412 Malignant neoplasm of upper-outer quadrant of left female breast: Secondary | ICD-10-CM

## 2022-04-10 DIAGNOSIS — R928 Other abnormal and inconclusive findings on diagnostic imaging of breast: Secondary | ICD-10-CM | POA: Diagnosis not present

## 2022-04-10 DIAGNOSIS — Z171 Estrogen receptor negative status [ER-]: Secondary | ICD-10-CM

## 2022-04-10 DIAGNOSIS — Z853 Personal history of malignant neoplasm of breast: Secondary | ICD-10-CM | POA: Diagnosis not present

## 2022-04-17 ENCOUNTER — Other Ambulatory Visit: Payer: Self-pay

## 2022-04-17 ENCOUNTER — Inpatient Hospital Stay: Payer: PPO | Attending: Oncology

## 2022-04-17 ENCOUNTER — Other Ambulatory Visit (HOSPITAL_COMMUNITY): Payer: Self-pay

## 2022-04-17 VITALS — BP 130/85 | HR 100 | Temp 98.6°F | Resp 18

## 2022-04-17 DIAGNOSIS — Z95828 Presence of other vascular implants and grafts: Secondary | ICD-10-CM

## 2022-04-17 DIAGNOSIS — C773 Secondary and unspecified malignant neoplasm of axilla and upper limb lymph nodes: Secondary | ICD-10-CM | POA: Insufficient documentation

## 2022-04-17 DIAGNOSIS — Z79899 Other long term (current) drug therapy: Secondary | ICD-10-CM | POA: Insufficient documentation

## 2022-04-17 DIAGNOSIS — C50812 Malignant neoplasm of overlapping sites of left female breast: Secondary | ICD-10-CM | POA: Insufficient documentation

## 2022-04-17 DIAGNOSIS — Z5111 Encounter for antineoplastic chemotherapy: Secondary | ICD-10-CM | POA: Diagnosis not present

## 2022-04-17 DIAGNOSIS — C50919 Malignant neoplasm of unspecified site of unspecified female breast: Secondary | ICD-10-CM

## 2022-04-17 DIAGNOSIS — Z171 Estrogen receptor negative status [ER-]: Secondary | ICD-10-CM

## 2022-04-17 DIAGNOSIS — Z1501 Genetic susceptibility to malignant neoplasm of breast: Secondary | ICD-10-CM

## 2022-04-17 DIAGNOSIS — C50412 Malignant neoplasm of upper-outer quadrant of left female breast: Secondary | ICD-10-CM

## 2022-04-17 MED ORDER — FULVESTRANT 250 MG/5ML IM SOSY
500.0000 mg | PREFILLED_SYRINGE | Freq: Once | INTRAMUSCULAR | Status: AC
  Administered 2022-04-17: 500 mg via INTRAMUSCULAR
  Filled 2022-04-17: qty 10

## 2022-05-07 ENCOUNTER — Encounter: Payer: PPO | Admitting: Nurse Practitioner

## 2022-05-09 ENCOUNTER — Ambulatory Visit (INDEPENDENT_AMBULATORY_CARE_PROVIDER_SITE_OTHER): Payer: PPO

## 2022-05-09 ENCOUNTER — Telehealth: Payer: PPO

## 2022-05-09 DIAGNOSIS — I10 Essential (primary) hypertension: Secondary | ICD-10-CM

## 2022-05-09 DIAGNOSIS — R7303 Prediabetes: Secondary | ICD-10-CM

## 2022-05-09 DIAGNOSIS — C50912 Malignant neoplasm of unspecified site of left female breast: Secondary | ICD-10-CM

## 2022-05-09 NOTE — Chronic Care Management (AMB) (Signed)
Chronic Care Management   CCM RN Visit Note  05/09/2022 Name: [name] MRN: [id number] DOB: 1951-06-03  Subjective: [name] is a 71 y.o. year old female who is a primary care patient of Minette Brine, [place]. The care management team was consulted for assistance with disease management and care coordination needs.    Engaged with patient by telephone for follow up visit in response to provider referral for case management and/or care coordination services.   Consent to Services:  The patient was given the following information about Chronic Care Management services today, agreed to services, and gave verbal consent: 1. CCM service includes personalized support from designated clinical staff supervised by the primary care provider, including individualized plan of care and coordination with other care providers 2. 24/7 contact phone numbers for assistance for urgent and routine care needs. 3. Service will only be billed when office clinical staff spend 20 minutes or more in a month to coordinate care. 4. Only one practitioner may furnish and bill the service in a calendar month. 5.The patient may stop CCM services at any time (effective at the end of the month) by phone call to the office staff. 6. The patient will be responsible for cost sharing (co-pay) of up to 20% of the service fee (after annual deductible is met). Patient agreed to services and consent obtained.  Patient agreed to services and verbal consent obtained.   Assessment: Review of patient past medical history, allergies, medications, health status, including review of consultants reports, laboratory and other test data, was performed as part of comprehensive evaluation and provision of chronic care management services.   SDOH (Social Determinants of Health) assessments and interventions performed:    CCM Care Plan  No Known Allergies  Outpatient Encounter Medications as of 05/09/2022  Medication Sig Note    acetaminophen (TYLENOL) 500 MG tablet Take 1,000 mg by mouth every 6 (six) hours as needed for mild pain or moderate pain.    gabapentin (NEURONTIN) 300 MG capsule Take 1 capsule (300 mg total) by mouth at bedtime.    hydroxypropyl methylcellulose / hypromellose (ISOPTO TEARS / GONIOVISC) 2.5 % ophthalmic solution Place 1 drop into both eyes daily as needed for dry eyes.    Liniments (SALONPAS ARTHRITIS PAIN RELIEF EX) Apply 1 application topically daily as needed (knee pain).    lisinopril-hydrochlorothiazide (ZESTORETIC) 20-12.5 MG tablet TAKE 1/2 TABLET BY MOUTH DAILY    LUMIGAN 0.01 % SOLN     Multiple Vitamins-Minerals (AIRBORNE PO) Take 1 tablet by mouth 2 (two) times a week. 02/11/2022: Vitafusion   ursodiol (ACTIGALL) 500 MG tablet Take 500 mg by mouth 3 (three) times daily.     [DISCONTINUED] prochlorperazine (COMPAZINE) 10 MG tablet Take 1 tablet (10 mg total) by mouth every 6 (six) hours as needed (Nausea or vomiting).    No facility-administered encounter medications on file as of 05/09/2022.    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   Genetic testing 05/24/2015   Elevated LFTs 03/08/2015   Anemia in neoplastic disease 06/12/2014   Hypokalemia 05/29/2014   Watery eyes 05/09/2014   Malignant neoplasm of colon ([place]) 04/03/2014   Family history  of malignant neoplasm of breast 04/03/2014   Malignant neoplasm of upper-outer quadrant of left breast in female, estrogen receptor negative ([place]) 03/01/2014    Conditions to be addressed/monitored:HTN, DMII, and Reoccurring cancer of left breast  Care Plan : [place] of Care   Updates made by Lynne Logan, RN since 05/09/2022 12:00 AM     Problem: No plan of care established for management of chronic disease states Essential hypertension, Prediabetes   Priority: High     Long-Range Goal: Establishment of care for management of chronic disease states Essential hypertension, Prediabetes   Start Date: 01/06/2022  Expected End Date: 01/07/2023  Recent Progress: On track  Priority: High  Note:   Current Barriers:  Knowledge Deficits related to plan of care for management of Essential hypertension, Prediabetes  Chronic Disease Management support and education needs related to Essential hypertension, Prediabetes   RNCM Clinical Goal(s):  Patient will continue to work with RN Care Manager to address care management and care coordination needs related to  Essential hypertension, Prediabetes as evidenced by adherence to CM Team Scheduled appointments work with Education officer, museum to address  related to the management of Limited education about Advanced directives* and Lack of essential utilities - heating resources needed* related to the management of Essential hypertension, Prediabetes as evidenced by review of EMR and patient or Education officer, museum report through collaboration with Consulting civil engineer, provider, and care team.   Interventions: 1:1 collaboration with primary care provider regarding development and update of comprehensive plan of care as evidenced by provider attestation and co-signature Inter-disciplinary care team collaboration (see longitudinal plan of care) Evaluation of current treatment plan related to  self management and patient's adherence to plan as established by provider   Interdisciplinary Collaboration Interventions:  (Status: Goal on track:  Yes.) Long Term Goal   Collaborated with BSW to initiate plan of care to address needs related to Limited education about Advanced directives* and Lack of essential utilities - heating resources needed* in patient  with  Essential hypertension, Prediabetes Collaboration with Minette Brine, FNP regarding development and update of comprehensive plan of care as evidenced by provider attestation and co-signature Inter-disciplinary care team collaboration   Glaucoma Interventions:  (Status:  Condition stable.  Not addressed this visit.)  Long Term Goal Evaluation of current treatment plan related to  Glaucoma ,  self-management and patient's adherence to plan as established by provider Assessed for patient understanding of her eye disease, reports this condition is stable Review of patient status, including review of consultant's reports, relevant laboratory and other test results, and medications completed Reviewed medications with patient and discussed importance of medication adherence Determined patient is having financial difficulty paying for several of her eye drops, she is getting samples from prescribing eye specialist  Sent embedded Pharm D referral to assist with cost of eye drops, sent in basket message with details  Discussed plans with patient for ongoing care management follow up and provided patient with direct contact information for care management team  Hypertension Interventions:  (Status:  Goal on track:  Yes.) Long Term Goal Last practice recorded BP readings:  BP Readings from Last 3 Encounters:  04/17/22 130/85  03/20/22 (!) 138/92  02/20/22 129/88  Most recent eGFR/CrCl:  Lab Results  Component Value Date   EGFR 66 12/18/2021    No components found for: "CRCL" Evaluation of current treatment plan related to hypertension self management and patient's adherence to plan as established by provider Counseled on the  importance of exercise goals with target of 150 minutes per week Advised patient, providing education and rationale, to monitor blood pressure daily and record, calling PCP for findings outside established parameters Reviewed scheduled/upcoming provider appointments  including:  Provided education on prescribed diet low Sodium  Discussed complications of poorly controlled blood pressure such as heart disease, stroke, circulatory complications, vision complications, kidney impairment, sexual dysfunction Educated patient about the PREP program, patient declines at this time  Mailed printed ed materials related to How to Manage High Blood Pressure   Oncology:  (Status: Condition stable.  Not addressed this visit.) Long Term Goal Assessment of understanding of oncology diagnosis:  Assessed patient understanding of cancer diagnosis and recommended treatment plan, Reviewed upcoming provider appointments and treatment appointments, Assessed available transportation to appointments and treatments. Has consistent/reliable transportation: Yes, and Assessed support system. Has consistent/reliable family or other support: Yes Determined patient no longer has a port a cath, she is receiving monthly Chemo injections, tolerating well, reports fatigue is persistent but has not worsened Educated patient on ways to preserve energy, completing tasks during most energetic time of day, balancing activities with rest, staying well hydrated, eating well balanced diet and getting plenty of uninterrupted sleep Instructed patient to keep her health care team well informed of worsening concerns or change in condition  Discussed plans with patient for ongoing care management follow up and provided patient with direct contact information for care management team  Prediabetes Interventions:  (Status:  Goal on track:  Yes.) Long Term Goal Assessed patient's understanding of A1c goal:  <5.7% Provided education to patient about basic DM disease process Reviewed medications with patient and discussed importance of medication adherence Review of patient status, including review of consultants reports, relevant laboratory and other test results, and medications completed Educated patient  regarding Meal planning using the plate method and portion control  Educated patient regarding exercise recommendations per ADA  Mailed printed educational materials related to Meal planning; Carb Choice list; The Dangers in Skipping Meals  Lab Results  Component Value Date   HGBA1C 5.9 (H) 12/18/2021  Patient Goals/Self-Care Activities: Take all medications as prescribed Attend all scheduled provider appointments Call pharmacy for medication refills 3-7 days in advance of running out of medications Perform all self care activities independently  Call provider office for new concerns or questions  Work with the social worker to address care coordination needs and will continue to work with the clinical team to address health care and disease management related needs drink 6 to 8 glasses of water each day eat fish at least once per week fill half of plate with vegetables manage portion size check blood pressure 3 times per week write blood pressure results in a log or diary take blood pressure log to all doctor appointments take medications for blood pressure exactly as prescribed begin an exercise program  Follow Up Plan:  Telephone follow up appointment with care management team member scheduled for:  07/11/22 _0 :4 AM       Barb Merino, RN, BSN, CCM Care Management Coordinator [facility]  Direct Phone: [phone]

## 2022-05-09 NOTE — Patient Instructions (Addendum)
Visit Information  Thank you for taking time to visit with me today. Please don't hesitate to contact me if I can be of assistance to you before our next scheduled telephone appointment.  Following are the goals we discussed today:  Take all medications as prescribed Attend all scheduled provider appointments Call pharmacy for medication refills 3-7 days in advance of running out of medications Perform all self care activities independently  Call provider office for new concerns or questions  Work with the social worker to address care coordination needs and will continue to work with the clinical team to address health care and disease management related needs drink 6 to 8 glasses of water each day eat fish at least once per week fill half of plate with vegetables manage portion size check blood pressure 3 times per week write blood pressure results in a log or diary take blood pressure log to all doctor appointments take medications for blood pressure exactly as prescribed begin an exercise program   Our next appointment is by telephone on 07/11/22 at 10:30 AM   Please call the care guide team at [phone] if you need to cancel or reschedule your appointment.   If you are experiencing a Mental Health or [place] or need someone to talk to, please call 1-800-273-TALK (toll free, 24 hour hotline)   Patient verbalizes understanding of instructions and care plan provided today and agrees to view in [place]. Active MyChart status and patient understanding of how to access instructions and care plan via MyChart confirmed with patient.     Barb Merino, RN, BSN, CCM Care Management Coordinator [facility]  Direct Phone: [phone]

## 2022-05-15 ENCOUNTER — Inpatient Hospital Stay: Payer: PPO

## 2022-05-15 ENCOUNTER — Other Ambulatory Visit: Payer: Self-pay

## 2022-05-15 VITALS — BP 114/88 | HR 100 | Temp 98.3°F | Resp 18

## 2022-05-15 DIAGNOSIS — Z5111 Encounter for antineoplastic chemotherapy: Secondary | ICD-10-CM | POA: Diagnosis not present

## 2022-05-15 DIAGNOSIS — C50412 Malignant neoplasm of upper-outer quadrant of left female breast: Secondary | ICD-10-CM

## 2022-05-15 DIAGNOSIS — Z95828 Presence of other vascular implants and grafts: Secondary | ICD-10-CM

## 2022-05-15 DIAGNOSIS — C50912 Malignant neoplasm of unspecified site of left female breast: Secondary | ICD-10-CM | POA: Diagnosis not present

## 2022-05-15 DIAGNOSIS — H409 Unspecified glaucoma: Secondary | ICD-10-CM | POA: Diagnosis not present

## 2022-05-15 DIAGNOSIS — I10 Essential (primary) hypertension: Secondary | ICD-10-CM

## 2022-05-15 DIAGNOSIS — C50919 Malignant neoplasm of unspecified site of unspecified female breast: Secondary | ICD-10-CM

## 2022-05-15 MED ORDER — FULVESTRANT 250 MG/5ML IM SOSY
500.0000 mg | PREFILLED_SYRINGE | Freq: Once | INTRAMUSCULAR | Status: AC
  Administered 2022-05-15: 500 mg via INTRAMUSCULAR
  Filled 2022-05-15: qty 10

## 2022-06-12 ENCOUNTER — Inpatient Hospital Stay: Payer: PPO | Attending: Oncology

## 2022-06-12 VITALS — BP 103/65 | HR 100 | Temp 99.3°F | Resp 18

## 2022-06-12 DIAGNOSIS — C50919 Malignant neoplasm of unspecified site of unspecified female breast: Secondary | ICD-10-CM

## 2022-06-12 DIAGNOSIS — Z95828 Presence of other vascular implants and grafts: Secondary | ICD-10-CM

## 2022-06-12 DIAGNOSIS — C50812 Malignant neoplasm of overlapping sites of left female breast: Secondary | ICD-10-CM | POA: Insufficient documentation

## 2022-06-12 DIAGNOSIS — C773 Secondary and unspecified malignant neoplasm of axilla and upper limb lymph nodes: Secondary | ICD-10-CM | POA: Diagnosis not present

## 2022-06-12 DIAGNOSIS — Z5111 Encounter for antineoplastic chemotherapy: Secondary | ICD-10-CM | POA: Diagnosis not present

## 2022-06-12 DIAGNOSIS — Z79899 Other long term (current) drug therapy: Secondary | ICD-10-CM | POA: Diagnosis not present

## 2022-06-12 DIAGNOSIS — Z171 Estrogen receptor negative status [ER-]: Secondary | ICD-10-CM

## 2022-06-12 MED ORDER — FULVESTRANT 250 MG/5ML IM SOSY
500.0000 mg | PREFILLED_SYRINGE | Freq: Once | INTRAMUSCULAR | Status: AC
  Administered 2022-06-12: 500 mg via INTRAMUSCULAR
  Filled 2022-06-12: qty 10

## 2022-07-10 ENCOUNTER — Inpatient Hospital Stay: Payer: PPO | Attending: Oncology

## 2022-07-10 ENCOUNTER — Other Ambulatory Visit: Payer: Self-pay

## 2022-07-10 VITALS — BP 117/72 | HR 101 | Temp 99.3°F | Resp 20

## 2022-07-10 DIAGNOSIS — Z5111 Encounter for antineoplastic chemotherapy: Secondary | ICD-10-CM | POA: Diagnosis not present

## 2022-07-10 DIAGNOSIS — C50919 Malignant neoplasm of unspecified site of unspecified female breast: Secondary | ICD-10-CM

## 2022-07-10 DIAGNOSIS — C50812 Malignant neoplasm of overlapping sites of left female breast: Secondary | ICD-10-CM | POA: Insufficient documentation

## 2022-07-10 DIAGNOSIS — Z95828 Presence of other vascular implants and grafts: Secondary | ICD-10-CM

## 2022-07-10 DIAGNOSIS — Z79899 Other long term (current) drug therapy: Secondary | ICD-10-CM | POA: Diagnosis not present

## 2022-07-10 DIAGNOSIS — C773 Secondary and unspecified malignant neoplasm of axilla and upper limb lymph nodes: Secondary | ICD-10-CM | POA: Diagnosis not present

## 2022-07-10 DIAGNOSIS — C50412 Malignant neoplasm of upper-outer quadrant of left female breast: Secondary | ICD-10-CM

## 2022-07-10 DIAGNOSIS — Z171 Estrogen receptor negative status [ER-]: Secondary | ICD-10-CM

## 2022-07-10 DIAGNOSIS — Z1501 Genetic susceptibility to malignant neoplasm of breast: Secondary | ICD-10-CM

## 2022-07-10 MED ORDER — FULVESTRANT 250 MG/5ML IM SOSY
500.0000 mg | PREFILLED_SYRINGE | Freq: Once | INTRAMUSCULAR | Status: AC
  Administered 2022-07-10: 500 mg via INTRAMUSCULAR
  Filled 2022-07-10: qty 10

## 2022-07-11 ENCOUNTER — Telehealth: Payer: PPO

## 2022-07-14 ENCOUNTER — Telehealth: Payer: Self-pay | Admitting: Hematology and Oncology

## 2022-07-14 NOTE — Telephone Encounter (Signed)
Contacted patient to scheduled appointments. Patient is aware of appointments that are scheduled.   

## 2022-07-28 ENCOUNTER — Other Ambulatory Visit (HOSPITAL_COMMUNITY): Payer: Self-pay

## 2022-08-08 ENCOUNTER — Inpatient Hospital Stay: Payer: PPO | Attending: Oncology

## 2022-08-08 VITALS — BP 125/79 | HR 90 | Temp 98.6°F | Resp 20

## 2022-08-08 DIAGNOSIS — C50919 Malignant neoplasm of unspecified site of unspecified female breast: Secondary | ICD-10-CM

## 2022-08-08 DIAGNOSIS — C773 Secondary and unspecified malignant neoplasm of axilla and upper limb lymph nodes: Secondary | ICD-10-CM | POA: Diagnosis not present

## 2022-08-08 DIAGNOSIS — C50812 Malignant neoplasm of overlapping sites of left female breast: Secondary | ICD-10-CM | POA: Insufficient documentation

## 2022-08-08 DIAGNOSIS — Z5111 Encounter for antineoplastic chemotherapy: Secondary | ICD-10-CM | POA: Diagnosis not present

## 2022-08-08 DIAGNOSIS — Z95828 Presence of other vascular implants and grafts: Secondary | ICD-10-CM

## 2022-08-08 DIAGNOSIS — Z79899 Other long term (current) drug therapy: Secondary | ICD-10-CM | POA: Diagnosis not present

## 2022-08-08 DIAGNOSIS — Z171 Estrogen receptor negative status [ER-]: Secondary | ICD-10-CM

## 2022-08-08 DIAGNOSIS — Z1501 Genetic susceptibility to malignant neoplasm of breast: Secondary | ICD-10-CM

## 2022-08-08 MED ORDER — FULVESTRANT 250 MG/5ML IM SOSY
500.0000 mg | PREFILLED_SYRINGE | Freq: Once | INTRAMUSCULAR | Status: AC
  Administered 2022-08-08: 500 mg via INTRAMUSCULAR
  Filled 2022-08-08: qty 10

## 2022-09-04 ENCOUNTER — Other Ambulatory Visit: Payer: Self-pay

## 2022-09-04 ENCOUNTER — Inpatient Hospital Stay: Payer: PPO | Attending: Oncology

## 2022-09-04 VITALS — BP 129/75 | HR 97 | Temp 98.4°F | Resp 20

## 2022-09-04 DIAGNOSIS — C773 Secondary and unspecified malignant neoplasm of axilla and upper limb lymph nodes: Secondary | ICD-10-CM | POA: Diagnosis not present

## 2022-09-04 DIAGNOSIS — Z79899 Other long term (current) drug therapy: Secondary | ICD-10-CM | POA: Insufficient documentation

## 2022-09-04 DIAGNOSIS — C50919 Malignant neoplasm of unspecified site of unspecified female breast: Secondary | ICD-10-CM

## 2022-09-04 DIAGNOSIS — Z171 Estrogen receptor negative status [ER-]: Secondary | ICD-10-CM

## 2022-09-04 DIAGNOSIS — Z5111 Encounter for antineoplastic chemotherapy: Secondary | ICD-10-CM | POA: Diagnosis not present

## 2022-09-04 DIAGNOSIS — C50812 Malignant neoplasm of overlapping sites of left female breast: Secondary | ICD-10-CM | POA: Diagnosis not present

## 2022-09-04 DIAGNOSIS — Z95828 Presence of other vascular implants and grafts: Secondary | ICD-10-CM

## 2022-09-04 MED ORDER — FULVESTRANT 250 MG/5ML IM SOSY
500.0000 mg | PREFILLED_SYRINGE | Freq: Once | INTRAMUSCULAR | Status: AC
  Administered 2022-09-04: 500 mg via INTRAMUSCULAR
  Filled 2022-09-04: qty 10

## 2022-09-04 NOTE — Patient Instructions (Signed)

## 2022-09-30 ENCOUNTER — Other Ambulatory Visit: Payer: Self-pay | Admitting: Hematology and Oncology

## 2022-09-30 DIAGNOSIS — N6320 Unspecified lump in the left breast, unspecified quadrant: Secondary | ICD-10-CM

## 2022-10-02 ENCOUNTER — Inpatient Hospital Stay: Payer: PPO | Attending: Oncology | Admitting: Hematology and Oncology

## 2022-10-02 ENCOUNTER — Encounter: Payer: Self-pay | Admitting: Hematology and Oncology

## 2022-10-02 ENCOUNTER — Inpatient Hospital Stay: Payer: PPO

## 2022-10-02 ENCOUNTER — Other Ambulatory Visit: Payer: Self-pay

## 2022-10-02 VITALS — BP 111/89 | HR 100 | Temp 97.9°F | Resp 16 | Ht 61.0 in | Wt 225.2 lb

## 2022-10-02 DIAGNOSIS — C50812 Malignant neoplasm of overlapping sites of left female breast: Secondary | ICD-10-CM | POA: Insufficient documentation

## 2022-10-02 DIAGNOSIS — C50412 Malignant neoplasm of upper-outer quadrant of left female breast: Secondary | ICD-10-CM | POA: Diagnosis not present

## 2022-10-02 DIAGNOSIS — Z79899 Other long term (current) drug therapy: Secondary | ICD-10-CM | POA: Diagnosis not present

## 2022-10-02 DIAGNOSIS — Z171 Estrogen receptor negative status [ER-]: Secondary | ICD-10-CM | POA: Diagnosis not present

## 2022-10-02 DIAGNOSIS — Z95828 Presence of other vascular implants and grafts: Secondary | ICD-10-CM

## 2022-10-02 DIAGNOSIS — Z5111 Encounter for antineoplastic chemotherapy: Secondary | ICD-10-CM | POA: Insufficient documentation

## 2022-10-02 DIAGNOSIS — C50919 Malignant neoplasm of unspecified site of unspecified female breast: Secondary | ICD-10-CM

## 2022-10-02 LAB — CBC WITH DIFFERENTIAL (CANCER CENTER ONLY)
Abs Immature Granulocytes: 0.01 10*3/uL (ref 0.00–0.07)
Basophils Absolute: 0 10*3/uL (ref 0.0–0.1)
Basophils Relative: 1 %
Eosinophils Absolute: 0.1 10*3/uL (ref 0.0–0.5)
Eosinophils Relative: 2 %
HCT: 36.7 % (ref 36.0–46.0)
Hemoglobin: 12.4 g/dL (ref 12.0–15.0)
Immature Granulocytes: 0 %
Lymphocytes Relative: 41 %
Lymphs Abs: 2.1 10*3/uL (ref 0.7–4.0)
MCH: 30.6 pg (ref 26.0–34.0)
MCHC: 33.8 g/dL (ref 30.0–36.0)
MCV: 90.6 fL (ref 80.0–100.0)
Monocytes Absolute: 0.4 10*3/uL (ref 0.1–1.0)
Monocytes Relative: 8 %
Neutro Abs: 2.5 10*3/uL (ref 1.7–7.7)
Neutrophils Relative %: 48 %
Platelet Count: 294 10*3/uL (ref 150–400)
RBC: 4.05 MIL/uL (ref 3.87–5.11)
RDW: 12.4 % (ref 11.5–15.5)
WBC Count: 5.1 10*3/uL (ref 4.0–10.5)
nRBC: 0 % (ref 0.0–0.2)

## 2022-10-02 LAB — CMP (CANCER CENTER ONLY)
ALT: 19 U/L (ref 0–44)
AST: 14 U/L — ABNORMAL LOW (ref 15–41)
Albumin: 4 g/dL (ref 3.5–5.0)
Alkaline Phosphatase: 139 U/L — ABNORMAL HIGH (ref 38–126)
Anion gap: 9 (ref 5–15)
BUN: 15 mg/dL (ref 8–23)
CO2: 27 mmol/L (ref 22–32)
Calcium: 10.3 mg/dL (ref 8.9–10.3)
Chloride: 106 mmol/L (ref 98–111)
Creatinine: 1.07 mg/dL — ABNORMAL HIGH (ref 0.44–1.00)
GFR, Estimated: 56 mL/min — ABNORMAL LOW (ref >60)
Glucose, Bld: 89 mg/dL (ref 70–99)
Potassium: 4.1 mmol/L (ref 3.5–5.1)
Sodium: 142 mmol/L (ref 135–145)
Total Bilirubin: 0.8 mg/dL (ref 0.3–1.2)
Total Protein: 7.4 g/dL (ref 6.5–8.1)

## 2022-10-02 MED ORDER — FULVESTRANT 250 MG/5ML IM SOSY
500.0000 mg | PREFILLED_SYRINGE | Freq: Once | INTRAMUSCULAR | Status: AC
  Administered 2022-10-02: 500 mg via INTRAMUSCULAR
  Filled 2022-10-02: qty 10

## 2022-10-02 NOTE — Progress Notes (Signed)
[facility]  Telephone:([phone]) [phone] Fax:(336) [phone]    ID: [name] DOB: 1951/04/19  MR#: [id number]  PPI#:[id number]  Patient Care Team: Minette Brine, FNP as PCP - General (General Practice) Magrinat, Virgie Dad, MD (Inactive) as Consulting Physician (Oncology) Rolm Bookbinder, MD as Consulting Physician (General Surgery) Gery Pray, MD as Consulting Physician (Radiation Oncology) [name], MD as Consulting Physician (Ophthalmology)   CHIEF COMPLAINT: triple negative breast cancer; PALB2 positive (s/p left mastectomy)  CURRENT TREATMENT: fulvestrant  INTERVAL HISTORY:  [name] returns today for follow up and treatment of her recurrent left breast cancer.  She continues on fulvestrant.  She tolerates this with no side effects that she is aware of.   Initial plan was to consider Ibrance and antiestrogen therapy after surgery however since she was found to have an Oncotype of 60, she received adjuvant chemo and it was also noted on the Oncotype that the profile was consistent with triple negative breast cancer.  Pathology from the surgery showed 70% moderate staining hence Dr. Jana Hakim has discussed about considering Faslodex alone for 5 years. Last mammogram July 2023, She had a left breast mammogram and biopsy which was negative for malignancy.  She continues on faslodex. She denies any complaints for me today. She had a good Christmas at her grand daughter's place.  Rest of the pertinent 10 point ROS reviewed and negative   COVID 19 VACCINATION STATUS: fully vaccinated Levan Hurst), with booster 11/2020   BREAST CANCER HISTORY: From the original intake note:  [name] herself palpated a mass in her left breast, and immediately brought it to the attention of her primary care physician, Dr. Baird Cancer, who confirmed the finding and setup the patient for bilateral diagnostic mammography at the breast Center 02/20/2014. This showed a high density mass in  the outer left breast measuring up to 3.7 cm. It corresponded to the site of palpable concern. In addition the normal 4 logically abnormal lymph nodes in the left axilla. The mass was palpable by exam, and by ultrasonography measured 2.6 cm. There were enlarged and morphologically abnormal lymph nodes in the left axilla, largest measuring 3 cm.  Biopsy of the left breast mass in question and 1 of the abnormal axillary lymph nodes 02/22/2014 showed ([id number]) biopsies to be positive for an invasive ductal carcinoma, grade 3, triple negative, with an MIB-1 of 90%. Specifically the HER-2 signals ratio was 1.05, and the number per cell 2.00.  MRI of the breasts 03/01/2014 showed a 3.8 cm enhancing mass with areas of apparent necrosis in the left breast, as well as the biopsy tract extending laterally, the combined measure 5.3 cm. There were no other masses or areas of abnormal enhancement. There were multiple abnormally enlarged left axillary lymph nodes with loss of the normal fatty hilum. These included level I and retropectoral lymph nodes.  The patient's subsequent history is as detailed below   PAST MEDICAL HISTORY: Past Medical History:  Diagnosis Date   Colon cancer ([place])    Hx of rotator cuff surgery    Hypertension    left breast cancer    Personal history of chemotherapy 2016   left   Personal history of radiation therapy 2016   left breast    Radiation 11/15/14-01/02/15   left breast, axillary and supraclavicular region 45 gray, lumpectomy cavity boosted to 16 gray    PAST SURGICAL HISTORY: Past Surgical History:  Procedure Laterality Date   ABDOMINAL HYSTERECTOMY  1988   BREAST EXCISIONAL BIOPSY Right 12/21/2017  BREAST LUMPECTOMY Left 2016   COLON SURGERY  1988   colectomy/colostomy-ca   COLON SURGERY  1988   colostomy takedown-reversal   COLONOSCOPY     EXCISION / BIOPSY BREAST / NIPPLE / DUCT Left 09/20/2014   IR IMAGING GUIDED PORT INSERTION  03/22/2020   MASTECTOMY  Left 02/14/2020   MASTECTOMY W/ SENTINEL NODE BIOPSY Left 02/14/2020   Procedure: LEFT MASTECTOMY WITH BLUE DYE INJECTION;  Surgeon: Rolm Bookbinder, MD;  Location: [facility];  Service: General;  Laterality: Left;  PEC BLOCK   RADIOACTIVE SEED GUIDED EXCISIONAL BREAST BIOPSY Right 12/21/2017   Procedure: RIGHT RADIOACTIVE SEED GUIDED EXCISIONAL BREAST BIOPSY ERAS PATHWAY;  Surgeon: Rolm Bookbinder, MD;  Location: [facility];  Service: General;  Laterality: Right;   RADIOACTIVE SEED GUIDED PARTIAL MASTECTOMY WITH AXILLARY SENTINEL LYMPH NODE BIOPSY Left 09/20/2014   Procedure: RADIOACTIVE SEED GUIDED LEFT BREAST LUMPECTOMY WITH AXILLARY SENTINEL LYMPH NODE BIOPSY;  Surgeon: Rolm Bookbinder, MD;  Location: [facility];  Service: General;  Laterality: Left;   TOTAL SHOULDER ARTHROPLASTY  2009   right    FAMILY HISTORY Family History  Problem Relation Age of Onset   Cancer Mother 71       breast   Breast cancer Mother        early 3s   Stroke Father 6       cause of death   Diabetes Father    Cancer Maternal Aunt        breast cancer at unknown age   Breast cancer Maternal Aunt    Ovarian cancer Neg Hx    the patient's father died at the age of 47 following a stroke in the setting of diabetes; the patient's mother is living at 90. The patient has 3 brothers and 2 sisters. [name], [name], was diagnosed with breast cancer in her early 21s. There is no other breast or ovarian cancer in the family. The patient herself was diagnosed with watts by history he is an incidentally found stage I colon carcinoma noted at the time of her hysterectomy. She had a partial colectomy but no adjuvant treatment. We have no records regarding that procedure   GYNECOLOGIC HISTORY:  No LMP recorded. Patient has had a hysterectomy. Menarche age 32, first live birth age 66, the patient underwent total abdominal hysterectomy with bilateral salpingo-oophorectomy in her  73s. She did not take hormone replacement. She did not take oral contraceptives at any point.   SOCIAL HISTORY: (Updated May 2021) Debany worked for [employer] for about 40 years, retiring in 2008. She is divorced and lives alone, with no pets.  Her grandson who is a Conservator, museum/gallery frequently stays with her.  Her daughter Yamilka Lopiccolo works for [employer] in Press [employer]. The second daughter, Amorita Vanrossum, works as a Scientist, water quality at [employer].  The patient has 6 grandchildren and three great-grandchildren. She attends a [employer].     ADVANCED DIRECTIVES: Not in place. The patient intends to name her daughter Marcello Fennel. her healthcare power of attorney. [name] is work number is [phone]. On the patient's 03/01/2014 visit she was given a copy of the appropriate documents to complete and notarize at her discretion.     HEALTH MAINTENANCE: Social History   Tobacco Use   Smoking status: Never   Smokeless tobacco: Never  Vaping Use   Vaping Use: Never used  Substance Use Topics   Alcohol use: Not Currently    Comment: occ-rare  Drug use: No     Colonoscopy: 2000  PAP: May 2015  Bone density: 12/21/2017, T-score of -0.6  Lipid panel: 02/03/2014  No Known Allergies  Current Outpatient Medications  Medication Sig Dispense Refill   acetaminophen (TYLENOL) 500 MG tablet Take 1,000 mg by mouth every 6 (six) hours as needed for mild pain or moderate pain.     gabapentin (NEURONTIN) 300 MG capsule Take 1 capsule (300 mg total) by mouth at bedtime. 90 capsule 4   hydroxypropyl methylcellulose / hypromellose (ISOPTO TEARS / GONIOVISC) 2.5 % ophthalmic solution Place 1 drop into both eyes daily as needed for dry eyes.     Liniments (SALONPAS ARTHRITIS PAIN RELIEF EX) Apply 1 application topically daily as needed (knee pain).     lisinopril-hydrochlorothiazide (ZESTORETIC) 20-12.5 MG tablet TAKE 1/2 TABLET BY MOUTH DAILY 45 tablet 5   LUMIGAN 0.01 % SOLN      Multiple  Vitamins-Minerals (AIRBORNE PO) Take 1 tablet by mouth 2 (two) times a week.     ursodiol (ACTIGALL) 500 MG tablet Take 500 mg by mouth 3 (three) times daily.      No current facility-administered medications for this visit.    OBJECTIVE: African-American woman who appears older than stated age 72:   10/02/22 1109  BP: 111/89  Pulse: 100  Resp: 16  Temp: 97.9 F (36.6 C)  SpO2: 100%    Filed Weights   10/02/22 1109  Weight: 225 lb 3.2 oz (102.2 kg)    Body mass index is 42.55 kg/m.     ECOG FS:1 - Symptomatic but completely ambulatory   Physical Exam Constitutional:      Appearance: Normal appearance.  Cardiovascular:     Rate and Rhythm: Normal rate and regular rhythm.     Pulses: Normal pulses.     Heart sounds: Normal heart sounds.  Chest:     Comments: Bilateral breasts inspected.  Left breast status postmastectomy.  Right breast with no palpable masses or adenopathy. She has another [place] scheduled of the breast for follow up. Abdominal:     General: There is no distension.     Tenderness: There is no abdominal tenderness.  Musculoskeletal:     Cervical back: Normal range of motion and neck supple. No rigidity.  Lymphadenopathy:     Cervical: No cervical adenopathy.  Skin:    General: Skin is warm and dry.  Neurological:     General: No focal deficit present.     Mental Status: She is alert.  Psychiatric:        Mood and Affect: Mood normal.    LAB RESULTS:  No results found for: "SPEP", "UPEP"  Lab Results  Component Value Date   WBC 5.1 10/02/2022   NEUTROABS 2.5 10/02/2022   HGB 12.4 10/02/2022   HCT 36.7 10/02/2022   MCV 90.6 10/02/2022   PLT 294 10/02/2022      Chemistry      Component Value Date/Time   NA 142 10/02/2022 1033   NA 140 12/18/2021 1020   NA 142 11/13/2016 1050   K 4.1 10/02/2022 1033   K 4.1 11/13/2016 1050   CL 106 10/02/2022 1033   CO2 27 10/02/2022 1033   CO2 27 11/13/2016 1050   BUN 15 10/02/2022 1033   BUN 17  12/18/2021 1020   BUN 11.5 11/13/2016 1050   CREATININE 1.07 (H) 10/02/2022 1033   CREATININE 1.0 11/13/2016 1050      Component Value Date/Time   CALCIUM 10.3 10/02/2022  1033   CALCIUM 9.8 11/13/2016 1050   ALKPHOS 139 (H) 10/02/2022 1033   ALKPHOS 140 11/13/2016 1050   AST 14 (L) 10/02/2022 1033   AST 13 11/13/2016 1050   ALT 19 10/02/2022 1033   ALT 15 11/13/2016 1050   BILITOT 0.8 10/02/2022 1033   BILITOT 0.86 11/13/2016 1050       No results found for: "LABCA2"  No components found for: "LABCA125"  No results for input(s): "INR" in the last 168 hours.  Urinalysis    Component Value Date/Time   COLORURINE YELLOW 04/05/2008 1120   APPEARANCEUR CLEAR 04/05/2008 1120   LABSPEC 1.021 04/05/2008 1120   PHURINE 6.0 04/05/2008 1120   GLUCOSEU NEGATIVE 04/05/2008 1120   HGBUR NEGATIVE 04/05/2008 1120   BILIRUBINUR negative 12/12/2020 1236   KETONESUR NEGATIVE 04/05/2008 1120   PROTEINUR Negative 12/12/2020 1236   PROTEINUR NEGATIVE 04/05/2008 1120   UROBILINOGEN 0.2 12/12/2020 1236   UROBILINOGEN 0.2 04/05/2008 1120   NITRITE negative 12/12/2020 1236   NITRITE NEGATIVE 04/05/2008 1120   LEUKOCYTESUR Negative 12/12/2020 1236    STUDIES: No results found.     ASSESSMENT: 72 y.o. BRCA negative [place] woman s/p left breast upper outer quadrant and left axillary lymph node biopsy 02/22/2014, both positive for a clinical T2 N2, stage IIIA invasive ductal carcinoma, grade 3, triple negative, with an MIB-1 of 90%  (1) neoadjuvant chemotherapy started 03/13/2014, with cyclophosphamide and doxorubicin in dose dense fashion x4, with Neulasta support on day 2, completed 04/24/2014, followed by weekly carboplatin and paclitaxel x12, paclitaxel reduced during cycle 5 and cycle 7 because of elevated bilirubin levels  (2) left lumpectomy and sentinel lymph node sampling 09/20/2014 showed a complete pathologic response.  (3) radiation completed April 2016  (4) remote  history of partial colectomy for early stage colon cancer incidentally found during TAH/BSO in the 1980s  (5) genetics testing since 04/03/2014, demonstrated a pathogenic mutation in the PALB2 gene  (a) other genes tested through the [employer] gene panel, [employer], showed no deleterious mutations.  (b) PALB2 mutations are known to increase the risk of breast and pancreatic cancer, possibly other cancers, but the data is preliminary  (c) the patient's 2 daughters have been tested and did not carry the gene  (d) Bular's MSH2 VUS has been reclassified as "likely benign"  (e) she is status post remote TAH/BSO  (f) intensified screening, with mammography in February and breast MRI in August planned  (6) staging studies showed right-sided lung nodules, too small to characterize, and a dominant right-sided thyroid nodule unchanged in size as compared to 2011; to be followed  (a) chest CT 04/24/2016 showed the small pulmonary nodules to be completely stable, consistent with benign findings.  (b) thyroid ultrasound--done on 07/22/2018 showed enlarged heterogeneous lobular and multinodular thyroid gland, TI-RADS category 3 nodules, not meeting criteria for further biopsy or dedicated imaging f/u.    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  (8) anastrozole started 07/08/2018 for breast cancer prevention, discontinued 01/30/2020 with disease recurrence  (a) bone density scan 01/01/2018 normal, T score -0.6  (9) RECURRENT DISEASE May 2021  (a) left breast overlapping biopsy 01/20/2020 shows a clinical  T2 N0, stage IIB invasive ductal carcinoma, grade 2 or 3, with moderate estrogen receptor positivity, progesterone receptor negative, HER-2 not amplified, MIB-1 of 80%  (b) left mastectomy on 02/14/2020 showed a pT2 pNX, invasive ductal carcinoma, grade 3,  with negative margins   (c) CT  of the chest and bone scan on 02/08/2020 shows no distant metastases  (10) started fulvestrant and palbociclib 02/24/2020 discontinued on 03/08/2020  (a) Oncotype sent on surgical sample shows score of 60 indicating a greater than 29% risk of recurrence on antiestrogens alone, and also predicting a significant benefit from chemotherapy.  (b) the Oncotype test found this tumor to be triple negative by single gene scores.   (11) cyclophosphamide, methotrexate, and fluorouracil [CMF] every 28 days x 8 cycles given from 03/27/2020-08/22/2020.  (12) fulvestrant resumed 10/03/2020   PLAN:  Ms. Pooja is now here for for follow-up on adjuvant Faslodex.  She started Faslodex in January 2022.  She is tolerating this very well.  Anticipate completion of 5 years of antiestrogen therapy in January 2027. No findings on physical examination today. She has [place] scheduled end of January.  Mammogram to be done in July 2024. She doesn't want to proceed with intensified screening because of cost issues. I will see her in Summer and every Summer to space out appointments between [place] and surgery team. Return to clinic in 6 months for further evaluation  Total time spent: 30 minutes  *Total Encounter Time as defined by the Centers for Medicare and Medicaid Services includes, in addition to the face-to-face time of a patient visit (documented in the note above) non-face-to-face time: obtaining and reviewing outside history, ordering and reviewing medications, tests or procedures, care coordination (communications with other health care professionals or caregivers) and documentation in the medical record.

## 2022-10-02 NOTE — Patient Instructions (Signed)

## 2022-10-13 ENCOUNTER — Ambulatory Visit
Admission: RE | Admit: 2022-10-13 | Discharge: 2022-10-13 | Disposition: A | Discharge status: Home / Self Care | Payer: PPO | Source: Ambulatory Visit | Attending: Hematology and Oncology | Admitting: Hematology and Oncology

## 2022-10-13 DIAGNOSIS — N6320 Unspecified lump in the left breast, unspecified quadrant: Secondary | ICD-10-CM

## 2022-10-13 DIAGNOSIS — Z853 Personal history of malignant neoplasm of breast: Secondary | ICD-10-CM | POA: Diagnosis not present

## 2022-10-15 ENCOUNTER — Other Ambulatory Visit: Payer: Self-pay | Admitting: Hematology and Oncology

## 2022-10-15 DIAGNOSIS — C50412 Malignant neoplasm of upper-outer quadrant of left female breast: Secondary | ICD-10-CM

## 2022-10-15 NOTE — Progress Notes (Signed)
Mammogram ordered for June.  [name]

## 2022-10-20 ENCOUNTER — Telehealth: Payer: Self-pay | Admitting: Hematology and Oncology

## 2022-10-20 NOTE — Telephone Encounter (Signed)
Scheduled appointment per los. Patient is aware of all made appointments. 

## 2022-10-30 ENCOUNTER — Inpatient Hospital Stay: Payer: PPO | Attending: Oncology

## 2022-10-30 VITALS — BP 131/73 | HR 97 | Temp 98.6°F | Resp 20

## 2022-10-30 DIAGNOSIS — Z5111 Encounter for antineoplastic chemotherapy: Secondary | ICD-10-CM | POA: Diagnosis not present

## 2022-10-30 DIAGNOSIS — Z79899 Other long term (current) drug therapy: Secondary | ICD-10-CM | POA: Diagnosis not present

## 2022-10-30 DIAGNOSIS — C50412 Malignant neoplasm of upper-outer quadrant of left female breast: Secondary | ICD-10-CM

## 2022-10-30 DIAGNOSIS — C50919 Malignant neoplasm of unspecified site of unspecified female breast: Secondary | ICD-10-CM

## 2022-10-30 DIAGNOSIS — Z95828 Presence of other vascular implants and grafts: Secondary | ICD-10-CM

## 2022-10-30 DIAGNOSIS — C50812 Malignant neoplasm of overlapping sites of left female breast: Secondary | ICD-10-CM | POA: Diagnosis not present

## 2022-10-30 DIAGNOSIS — Z171 Estrogen receptor negative status [ER-]: Secondary | ICD-10-CM

## 2022-10-30 MED ORDER — FULVESTRANT 250 MG/5ML IM SOSY
500.0000 mg | PREFILLED_SYRINGE | Freq: Once | INTRAMUSCULAR | Status: AC
  Administered 2022-10-30: 500 mg via INTRAMUSCULAR
  Filled 2022-10-30: qty 10

## 2022-11-12 DIAGNOSIS — Z171 Estrogen receptor negative status [ER-]: Secondary | ICD-10-CM | POA: Diagnosis not present

## 2022-11-12 DIAGNOSIS — C50412 Malignant neoplasm of upper-outer quadrant of left female breast: Secondary | ICD-10-CM | POA: Diagnosis not present

## 2022-11-19 ENCOUNTER — Other Ambulatory Visit: Payer: Self-pay | Admitting: Adult Health

## 2022-11-19 DIAGNOSIS — I10 Essential (primary) hypertension: Secondary | ICD-10-CM

## 2022-11-19 DIAGNOSIS — R0609 Other forms of dyspnea: Secondary | ICD-10-CM

## 2022-11-22 ENCOUNTER — Other Ambulatory Visit (HOSPITAL_COMMUNITY): Payer: Self-pay

## 2022-11-24 ENCOUNTER — Other Ambulatory Visit: Payer: Self-pay | Admitting: *Deleted

## 2022-11-24 ENCOUNTER — Other Ambulatory Visit (HOSPITAL_COMMUNITY): Payer: Self-pay

## 2022-11-24 DIAGNOSIS — R0609 Other forms of dyspnea: Secondary | ICD-10-CM

## 2022-11-24 DIAGNOSIS — I10 Essential (primary) hypertension: Secondary | ICD-10-CM

## 2022-11-24 MED ORDER — LISINOPRIL-HYDROCHLOROTHIAZIDE 20-12.5 MG PO TABS: 0.5000 | ORAL_TABLET | Freq: Every day | ORAL | 0 refills | Status: DC

## 2022-11-27 ENCOUNTER — Inpatient Hospital Stay: Payer: PPO | Attending: Oncology

## 2022-11-27 ENCOUNTER — Other Ambulatory Visit: Payer: Self-pay

## 2022-11-27 VITALS — BP 123/81 | HR 104 | Temp 98.5°F | Resp 20

## 2022-11-27 DIAGNOSIS — C50812 Malignant neoplasm of overlapping sites of left female breast: Secondary | ICD-10-CM | POA: Insufficient documentation

## 2022-11-27 DIAGNOSIS — Z79899 Other long term (current) drug therapy: Secondary | ICD-10-CM | POA: Insufficient documentation

## 2022-11-27 DIAGNOSIS — Z5111 Encounter for antineoplastic chemotherapy: Secondary | ICD-10-CM | POA: Diagnosis not present

## 2022-11-27 DIAGNOSIS — Z95828 Presence of other vascular implants and grafts: Secondary | ICD-10-CM

## 2022-11-27 DIAGNOSIS — C50919 Malignant neoplasm of unspecified site of unspecified female breast: Secondary | ICD-10-CM

## 2022-11-27 DIAGNOSIS — C50412 Malignant neoplasm of upper-outer quadrant of left female breast: Secondary | ICD-10-CM

## 2022-11-27 MED ORDER — FULVESTRANT 250 MG/5ML IM SOSY
500.0000 mg | PREFILLED_SYRINGE | Freq: Once | INTRAMUSCULAR | Status: AC
  Administered 2022-11-27: 500 mg via INTRAMUSCULAR
  Filled 2022-11-27: qty 10

## 2022-12-24 ENCOUNTER — Ambulatory Visit (INDEPENDENT_AMBULATORY_CARE_PROVIDER_SITE_OTHER): Payer: PPO | Admitting: Nurse Practitioner

## 2022-12-24 ENCOUNTER — Encounter: Payer: Self-pay | Admitting: Nurse Practitioner

## 2022-12-24 VITALS — BP 132/82 | HR 96 | Temp 98.2°F | Ht 61.0 in | Wt 230.0 lb

## 2022-12-24 DIAGNOSIS — Z0001 Encounter for general adult medical examination with abnormal findings: Secondary | ICD-10-CM | POA: Diagnosis not present

## 2022-12-24 DIAGNOSIS — R7303 Prediabetes: Secondary | ICD-10-CM | POA: Diagnosis not present

## 2022-12-24 DIAGNOSIS — I10 Essential (primary) hypertension: Secondary | ICD-10-CM

## 2022-12-24 DIAGNOSIS — Z0000 Encounter for general adult medical examination without abnormal findings: Secondary | ICD-10-CM

## 2022-12-24 DIAGNOSIS — C773 Secondary and unspecified malignant neoplasm of axilla and upper limb lymph nodes: Secondary | ICD-10-CM

## 2022-12-24 DIAGNOSIS — Z2821 Immunization not carried out because of patient refusal: Secondary | ICD-10-CM | POA: Diagnosis not present

## 2022-12-24 DIAGNOSIS — K743 Primary biliary cirrhosis: Secondary | ICD-10-CM | POA: Insufficient documentation

## 2022-12-24 DIAGNOSIS — Z6841 Body Mass Index (BMI) 40.0 and over, adult: Secondary | ICD-10-CM

## 2022-12-24 DIAGNOSIS — R635 Abnormal weight gain: Secondary | ICD-10-CM | POA: Insufficient documentation

## 2022-12-24 DIAGNOSIS — R748 Abnormal levels of other serum enzymes: Secondary | ICD-10-CM | POA: Insufficient documentation

## 2022-12-24 DIAGNOSIS — D509 Iron deficiency anemia, unspecified: Secondary | ICD-10-CM | POA: Insufficient documentation

## 2022-12-24 DIAGNOSIS — K760 Fatty (change of) liver, not elsewhere classified: Secondary | ICD-10-CM | POA: Insufficient documentation

## 2022-12-24 DIAGNOSIS — Z860100 Personal history of colon polyps, unspecified: Secondary | ICD-10-CM | POA: Insufficient documentation

## 2022-12-24 LAB — HEMOGLOBIN A1C
Est. average glucose Bld gHb Est-mCnc: 120 mg/dL
Hgb A1c MFr Bld: 5.8 % — ABNORMAL HIGH (ref 4.8–5.6)

## 2022-12-24 NOTE — Patient Instructions (Signed)
  Ms. [name] , Thank you for taking time to come for your Medicare Wellness Visit. I appreciate your ongoing commitment to your health goals. Please review the following plan we discussed and let me know if I can assist you in the future.   These are the goals we discussed:  Goals       Patient Stated (pt-stated)      No goals      Patient Stated      12/07/2019, no goals      Patient Stated      12/12/2020, no goals      Patient Stated      12/18/2021, be more active      Weight (lb) < 200 lb (90.7 kg)      Get out and do more and lose weight        This is a list of the screening recommended for you and due dates:  Health Maintenance  Topic Date Due   DTaP/Tdap/Td vaccine (1 - Tdap) Never done   Zoster (Shingles) Vaccine (1 of 2) 03/25/2023*   Pneumonia Vaccine (1 of 2 - PCV) 12/24/2023*   COVID-19 Vaccine (3 - Moderna risk series) 12/24/2027*   Flu Shot  04/16/2023   Medicare Annual Wellness Visit  12/24/2023   Mammogram  03/04/2024   Colon Cancer Screening  08/13/2028   DEXA scan (bone density measurement)  Completed   Hepatitis C Screening: USPSTF Recommendation to screen - Ages 76-79 yo.  Completed   HPV Vaccine  Aged Out  *Topic was postponed. The date shown is not the original due date.

## 2022-12-24 NOTE — Progress Notes (Signed)
Subjective:   [name] is a 72 y.o. female who presents for Medicare Annual (Subsequent) preventive examination.  Here for AWV. She has been using a cane over the last several months as precaution     Review of Systems  Constitutional: Negative.   HENT: Negative.    Respiratory: Negative.    Cardiovascular: Negative.   Gastrointestinal: Negative.   Genitourinary: Negative.   Musculoskeletal: Negative.   Skin: Negative.   Neurological: Negative.   Endo/Heme/Allergies: Negative.   Psychiatric/Behavioral: Negative.      Cardiac Risk Factors include: hypertension;obesity (BMI >30kg/m2)     Objective:    Today's Vitals   12/24/22 1030  BP: 132/82  Pulse: 96  Temp: 98.2 F (36.8 C)  TempSrc: Oral  SpO2: 99%  Weight: 230 lb (104.3 kg)  Height: 5\' 1"  (1.549 m)   Body mass index is 43.46 kg/m.  Physical Exam Vitals reviewed.  Constitutional:      General: She is not in acute distress.    Appearance: Normal appearance. She is obese.  Cardiovascular:     Pulses: Normal pulses.     Heart sounds: Normal heart sounds.  Pulmonary:     Effort: Pulmonary effort is normal. No respiratory distress.     Breath sounds: Normal breath sounds.  Musculoskeletal:        General: No swelling or tenderness. Normal range of motion.     Comments: She is using a cane for ambulation  Skin:    General: Skin is warm and dry.     Capillary Refill: Capillary refill takes less than 2 seconds.  Neurological:     General: No focal deficit present.     Mental Status: She is alert and oriented to person, place, and time. Mental status is at baseline.     Cranial Nerves: No cranial nerve deficit.  Psychiatric:        Mood and Affect: Mood and affect normal.        Behavior: Behavior normal.        Thought Content: Thought content normal.        Cognition and Memory: Memory normal.        Judgment: Judgment normal.        12/24/2022   10:25 AM 01/06/2022    9:06 AM  12/18/2021   10:24 AM 12/12/2020    9:09 AM 07/11/2020   10:13 AM 06/20/2020    9:54 AM 05/30/2020    1:15 PM  Advanced Directives  Does Patient Have a Medical Advance Directive? No No No No Yes Yes Yes  Type of Advance Directive     Living will Living will Living will  Does patient want to make changes to medical advance directive?     No - Patient declined    Would patient like information on creating a medical advance directive?  Yes (MAU/Ambulatory/Procedural Areas - Information given) No - Patient declined No - Patient declined No - Patient declined No - Patient declined No - Patient declined    Current Medications (verified) Outpatient Encounter Medications as of 12/24/2022  Medication Sig   acetaminophen (TYLENOL) 500 MG tablet Take 1,000 mg by mouth every 6 (six) hours as needed for mild pain or moderate pain.   gabapentin (NEURONTIN) 300 MG capsule Take 1 capsule (300 mg total) by mouth at bedtime.   hydroxypropyl methylcellulose / hypromellose (ISOPTO TEARS / GONIOVISC) 2.5 % ophthalmic solution Place 1 drop into both eyes daily as needed for dry eyes.  Liniments (SALONPAS ARTHRITIS PAIN RELIEF EX) Apply 1 application topically daily as needed (knee pain).   lisinopril-hydrochlorothiazide (ZESTORETIC) 20-12.5 MG tablet Take 0.5 tablets by mouth daily.   LUMIGAN 0.01 % SOLN    Multiple Vitamins-Minerals (AIRBORNE PO) Take 1 tablet by mouth 2 (two) times a week.   ursodiol (ACTIGALL) 500 MG tablet Take 500 mg by mouth 3 (three) times daily.    [DISCONTINUED] prochlorperazine (COMPAZINE) 10 MG tablet Take 1 tablet (10 mg total) by mouth every 6 (six) hours as needed (Nausea or vomiting).   No facility-administered encounter medications on file as of 12/24/2022.    Allergies (verified) Patient has no known allergies.   History: Past Medical History:  Diagnosis Date   Colon cancer    Hx of rotator cuff surgery    Hypertension    left breast cancer    Personal history of  chemotherapy 2016   left   Personal history of radiation therapy 2016   left breast    Radiation 11/15/14-01/02/15   left breast, axillary and supraclavicular region 45 gray, lumpectomy cavity boosted to 16 gray   Past Surgical History:  Procedure Laterality Date   ABDOMINAL HYSTERECTOMY  1988   BREAST EXCISIONAL BIOPSY Right 12/21/2017   BREAST LUMPECTOMY Left 2016   COLON SURGERY  1988   colectomy/colostomy-ca   COLON SURGERY  1988   colostomy takedown-reversal   COLONOSCOPY     EXCISION / BIOPSY BREAST / NIPPLE / DUCT Left 09/20/2014   IR IMAGING GUIDED PORT INSERTION  03/22/2020   MASTECTOMY Left 02/14/2020   MASTECTOMY W/ SENTINEL NODE BIOPSY Left 02/14/2020   Procedure: LEFT MASTECTOMY WITH BLUE DYE INJECTION;  Surgeon: Emelia Loron, MD;  Location: [facility] OR;  Service: General;  Laterality: Left;  PEC BLOCK   RADIOACTIVE SEED GUIDED EXCISIONAL BREAST BIOPSY Right 12/21/2017   Procedure: RIGHT RADIOACTIVE SEED GUIDED EXCISIONAL BREAST BIOPSY ERAS PATHWAY;  Surgeon: Emelia Loron, MD;  Location: [facility];  Service: General;  Laterality: Right;   RADIOACTIVE SEED GUIDED PARTIAL MASTECTOMY WITH AXILLARY SENTINEL LYMPH NODE BIOPSY Left 09/20/2014   Procedure: RADIOACTIVE SEED GUIDED LEFT BREAST LUMPECTOMY WITH AXILLARY SENTINEL LYMPH NODE BIOPSY;  Surgeon: Emelia Loron, MD;  Location: [facility];  Service: General;  Laterality: Left;   TOTAL SHOULDER ARTHROPLASTY  2009   right   Family History  Problem Relation Age of Onset   Cancer Mother 85       breast   Breast cancer Mother        early 61s   Stroke Father 103       cause of death   Diabetes Father    Cancer Maternal Aunt        breast cancer at unknown age   Breast cancer Maternal Aunt    Ovarian cancer Neg Hx    Social History   Socioeconomic History   Marital status: Divorced    Spouse name: Not on file   Number of children: 2   Years of education: Not on file   Highest  education level: Not on file  Occupational History    Employer: [employer]    Comment: retired in 2008  Tobacco Use   Smoking status: Never   Smokeless tobacco: Never  Vaping Use   Vaping Use: Never used  Substance and Sexual Activity   Alcohol use: Not Currently    Comment: occ-rare   Drug use: No   Sexual activity: Not Currently  Other Topics Concern  Not on file  Social History Narrative   Not on file   Social Determinants of Health   Financial Resource Strain: Low Risk  (12/24/2022)   Overall Financial Resource Strain (CARDIA)    Difficulty of Paying Living Expenses: Not hard at all  Food Insecurity: No Food Insecurity (12/24/2022)   Hunger Vital Sign    Worried About Running Out of Food in the Last Year: Never true    Ran Out of Food in the Last Year: Never true  Transportation Needs: No Transportation Needs (12/24/2022)   PRAPARE - Administrator, Civil Service (Medical): No    Lack of Transportation (Non-Medical): No  Physical Activity: Insufficiently Active (12/24/2022)   Exercise Vital Sign    Days of Exercise per Week: 1 day    Minutes of Exercise per Session: 10 min  Stress: No Stress Concern Present (12/24/2022)   [employer] of Occupational Health - Occupational Stress Questionnaire    Feeling of Stress : Not at all  Social Connections: Moderately Isolated (12/24/2022)   Social Connection and Isolation Panel [NHANES]    Frequency of Communication with Friends and Family: More than three times a week    Frequency of Social Gatherings with Friends and Family: More than three times a week    Attends Religious Services: More than 4 times per year    Active Member of [employer] or Organizations: No    Attends Banker Meetings: Never    Marital Status: Divorced    Tobacco Counseling Counseling given: Not Answered   Clinical Intake:  Diabetic? No   Activities of Daily Living    12/24/2022   10:25 AM  In your present state of  health, do you have any difficulty performing the following activities:  Hearing? 0  Vision? 0  Difficulty concentrating or making decisions? 0  Walking or climbing stairs? 1  Dressing or bathing? 0  Doing errands, shopping? 0  Preparing Food and eating ? N  Using the Toilet? N  In the past six months, have you accidently leaked urine? N  Do you have problems with loss of bowel control? N  Managing your Medications? N  Managing your Finances? N  Housekeeping or managing your Housekeeping? N    Patient Care Team: [name], FNP as PCP - General (General Practice) Magrinat, Valentino Hue, MD (Inactive) as Consulting Physician (Oncology) Emelia Loron, MD as Consulting Physician (General Surgery) Antony Blackbird, MD as Consulting Physician (Radiation Oncology) Chalmers Guest, MD as Consulting Physician (Ophthalmology)  Indicate any recent Medical Services you may have received from other than [facility] providers in the past year (date may be approximate).     Assessment:   This is a routine wellness examination for [place].  Hearing/Vision screen Hearing Screening   500Hz  1000Hz  2000Hz  4000Hz   Right ear Pass Pass Pass Fail  Left ear Pass Pass Pass Pass   Vision Screening   Right eye Left eye Both eyes  Without correction 20/50 20/50 20/50   With correction       Dietary issues and exercise activities discussed: Current Exercise Habits: The patient does not participate in regular exercise at present, Exercise limited by: orthopedic condition(s);cardiac condition(s)   Goals Addressed             This Visit's Progress    Weight (lb) < 200 lb (90.7 kg)   230 lb (104.3 kg)    Get out and do more and lose weight       Depression  Screen    12/24/2022   10:27 AM 12/18/2021   10:24 AM 12/18/2021    9:44 AM 12/12/2020    9:10 AM 12/07/2019   10:40 AM 05/12/2019   10:21 AM 11/11/2018   12:23 PM  PHQ 2/9 Scores  PHQ - 2 Score 0 0 0 0 0 0 0  PHQ- 9 Score   0  3  3    Fall  Risk    12/24/2022   10:27 AM 12/18/2021   10:24 AM 12/18/2021    9:44 AM 12/12/2020    9:09 AM 12/07/2019   10:40 AM  Fall Risk   Falls in the past year? 0 0 0 0 0  Number falls in past yr:  0 0    Injury with Fall?  0 0    Risk for fall due to :  Medication side effect No Fall Risks Medication side effect Medication side effect  Follow up  Falls evaluation completed;Education provided;Falls prevention discussed Falls evaluation completed Falls evaluation completed;Education provided;Falls prevention discussed Falls evaluation completed;Education provided;Falls prevention discussed    FALL RISK PREVENTION PERTAINING TO THE HOME:  Any stairs in or around the home? Yes  If so, are there any without handrails? Yes  Home free of loose throw rugs in walkways, pet beds, electrical cords, etc? Yes  Adequate lighting in your home to reduce risk of falls? Yes   ASSISTIVE DEVICES UTILIZED TO PREVENT FALLS:  Life alert? No  Use of a cane, walker or w/c? Yes  Grab bars in the bathroom? Yes  Shower chair or bench in shower? No  Elevated toilet seat or a handicapped toilet? No   TIMED UP AND GO:  Was the test performed? Yes .  Length of time to ambulate 10 feet: 7 sec.   Gait slow and steady with assistive device  Cognitive Function:        12/24/2022   11:15 AM 12/24/2022   11:14 AM 12/18/2021   10:25 AM 12/12/2020    9:11 AM 12/07/2019   10:42 AM  6CIT Screen  What Year? 0 points 0 points 0 points 0 points 0 points  What month? 0 points 0 points 0 points 0 points 0 points  What time? 0 points 0 points 0 points 0 points 0 points  Count back from 20 0 points 0 points 0 points 0 points 0 points  Months in reverse 2 points 2 points 0 points 0 points 0 points  Repeat phrase 0 points  0 points 0 points 2 points  Total Score 2 points  0 points 0 points 2 points    Immunizations Immunization History  Administered Date(s) Administered   Influenza, High Dose Seasonal PF 05/12/2019    Moderna SARS-COV2 Booster Vaccination 11/13/2020   Moderna Sars-Covid-2 Vaccination 10/30/2019, 11/27/2019    TDAP status: Due, Education has been provided regarding the importance of this vaccine. Advised may receive this vaccine at local pharmacy or Health Dept. Aware to provide a copy of the vaccination record if obtained from local pharmacy or Health Dept. Verbalized acceptance and understanding.  Flu Vaccine status: Declined, Education has been provided regarding the importance of this vaccine but patient still declined. Advised may receive this vaccine at local pharmacy or Health Dept. Aware to provide a copy of the vaccination record if obtained from local pharmacy or Health Dept. Verbalized acceptance and understanding.  Pneumococcal vaccine status: Declined,  Education has been provided regarding the importance of this vaccine but patient still declined. Advised  may receive this vaccine at local pharmacy or Health Dept. Aware to provide a copy of the vaccination record if obtained from local pharmacy or Health Dept. Verbalized acceptance and understanding.   Covid-19 vaccine status: Declined, Education has been provided regarding the importance of this vaccine but patient still declined. Advised may receive this vaccine at local pharmacy or Health Dept.or vaccine clinic. Aware to provide a copy of the vaccination record if obtained from local pharmacy or Health Dept. Verbalized acceptance and understanding.  Qualifies for Shingles Vaccine? Yes   Zostavax completed No   Shingrix Completed?: No.    Education has been provided regarding the importance of this vaccine. Patient has been advised to call insurance company to determine out of pocket expense if they have not yet received this vaccine. Advised may also receive vaccine at local pharmacy or Health Dept. Verbalized acceptance and understanding.  Screening Tests Health Maintenance  Topic Date Due   DTaP/Tdap/Td (1 - Tdap) Never done    Zoster Vaccines- Shingrix (1 of 2) 03/25/2023 (Originally 01/07/1970)   Pneumonia Vaccine 69+ Years old (1 of 2 - PCV) 12/24/2023 (Originally 01/07/1957)   COVID-19 Vaccine (3 - Moderna risk series) 12/24/2027 (Originally 12/11/2020)   INFLUENZA VACCINE  04/16/2023   Medicare Annual Wellness (AWV)  12/24/2023   MAMMOGRAM  03/04/2024   COLONOSCOPY (Pts 45-42yrs Insurance coverage will need to be confirmed)  08/13/2028   DEXA SCAN  Completed   Hepatitis C Screening  Completed   HPV VACCINES  Aged Out    Health Maintenance  Health Maintenance Due  Topic Date Due   DTaP/Tdap/Td (1 - Tdap) Never done    Colorectal cancer screening: Type of screening: Colonoscopy. Completed 08/13/2018. Repeat every 10 years  Mammogram status: Ordered 10/15/2022. Pt provided with contact info and advised to call to schedule appt.   Bone Density status: Completed 03/28/2021. Results reflect: Bone density results: OSTEOPOROSIS. Repeat every 2 years.  Lung Cancer Screening: (Low Dose CT Chest recommended if Age 40-80 years, 30 pack-year currently smoking OR have quit w/in 15years.) does not qualify.   Lung Cancer Screening Referral: no  Additional Screening:  Hepatitis C Screening: does qualify; Completed 11/11/2018  Vision Screening: Recommended annual ophthalmology exams for early detection of glaucoma and other disorders of the eye. Is the patient up to date with their annual eye exam?  No  Who is the provider or what is the name of the office in which the patient attends annual eye exams?  If pt is not established with a provider, would they like to be referred to a provider to establish care? No .   Dental Screening: Recommended annual dental exams for proper oral hygiene  Community Resource Referral / Chronic Care Management: CRR required this visit?  No   CCM required this visit?  No      Plan:     I have personally reviewed and noted the following in the patient's chart:   Medical and  social history Use of alcohol, tobacco or illicit drugs  Current medications and supplements including opioid prescriptions. Patient is not currently taking opioid prescriptions. Functional ability and status Nutritional status Physical activity Advanced directives List of other physicians Hospitalizations, surgeries, and ER visits in previous 12 months Vitals Screenings to include cognitive, depression, and falls Referrals and appointments  Encounter for Medicare annual wellness exam Pt's annual wellness exam was performed and geriatric assessment reviewed.  Pt has no new identiafble wellness concerns at this time.  WIll obtain routine  labs.  Will obtain UA and micro.  Behavior modifications discussed and diet history reviewed. Pt will continue to exercise regularly and modify diet, with low GI, plant based foods and decrease food intake of processed foods.  Recommend intake of daily multivitamin, Vitamin D, and calcium. Recommend mammogram (scheduled) and colonoscopy (UTD) for preventive screenings, as well as recommend immunizations that include influenza  and TDAP - declines tdap, pneumonia, covid and shingrix  Herpes zoster vaccination declined Declines shingrix, educated on disease process and is aware if he changes his mind to notify office  Tetanus, diphtheria, and acellular pertussis (Tdap) vaccination declined  Pneumococcal vaccination declined  COVID-19 vaccine dose declined Declines covid 19 vaccine. Discussed risk of covid 41 and if she changes her mind about the vaccine to call the office. Education has been provided regarding the importance of this vaccine but patient still declined. Advised may receive this vaccine at local pharmacy or Health Dept.or vaccine clinic. Aware to provide a copy of the vaccination record if obtained from local pharmacy or Health Dept.  Encouraged to take multivitamin, vitamin d, vitamin c and zinc to increase immune system. Aware can call  office if would like to have vaccine here at office. Verbalized acceptance and understanding.  Prediabetes - Will check HgbA1c was slightly elevated at last visit. Limit intake of sugary foods and drinks. - Plan: Hemoglobin A1c  Essential hypertension - Blood pressure is fairly controlled, continue current medications  Secondary and unspecified malignant neoplasm of axilla and upper limb lymph nodes - Continue f/u with Oncology  Primary biliary cirrhosis, Chronic  Obesity, morbid, BMI 40.0-49.9, Chronic Offered referral to PREP however declined. Encouraged to do exercises such as walking her driveway to get some activity   In addition, I have reviewed and discussed with patient certain preventive protocols, quality metrics, and best practice recommendations. A written personalized care plan for preventive services as well as general preventive health recommendations were provided to patient.     [name], FNP   12/24/2022

## 2022-12-25 ENCOUNTER — Other Ambulatory Visit: Payer: Self-pay

## 2022-12-25 ENCOUNTER — Inpatient Hospital Stay: Payer: PPO | Attending: Oncology

## 2022-12-25 VITALS — BP 97/68 | HR 100 | Temp 98.2°F | Resp 20

## 2022-12-25 DIAGNOSIS — Z79899 Other long term (current) drug therapy: Secondary | ICD-10-CM | POA: Diagnosis not present

## 2022-12-25 DIAGNOSIS — Z171 Estrogen receptor negative status [ER-]: Secondary | ICD-10-CM

## 2022-12-25 DIAGNOSIS — C50812 Malignant neoplasm of overlapping sites of left female breast: Secondary | ICD-10-CM | POA: Diagnosis not present

## 2022-12-25 DIAGNOSIS — C50919 Malignant neoplasm of unspecified site of unspecified female breast: Secondary | ICD-10-CM

## 2022-12-25 DIAGNOSIS — Z5111 Encounter for antineoplastic chemotherapy: Secondary | ICD-10-CM | POA: Insufficient documentation

## 2022-12-25 DIAGNOSIS — Z95828 Presence of other vascular implants and grafts: Secondary | ICD-10-CM

## 2022-12-25 MED ORDER — FULVESTRANT 250 MG/5ML IM SOSY
500.0000 mg | PREFILLED_SYRINGE | Freq: Once | INTRAMUSCULAR | Status: AC
  Administered 2022-12-25: 500 mg via INTRAMUSCULAR
  Filled 2022-12-25: qty 10

## 2023-01-22 ENCOUNTER — Inpatient Hospital Stay: Payer: PPO | Attending: Oncology

## 2023-01-22 ENCOUNTER — Other Ambulatory Visit: Payer: Self-pay

## 2023-01-22 VITALS — BP 134/79 | HR 96 | Temp 99.1°F | Resp 18

## 2023-01-22 DIAGNOSIS — C50812 Malignant neoplasm of overlapping sites of left female breast: Secondary | ICD-10-CM | POA: Insufficient documentation

## 2023-01-22 DIAGNOSIS — Z95828 Presence of other vascular implants and grafts: Secondary | ICD-10-CM

## 2023-01-22 DIAGNOSIS — Z171 Estrogen receptor negative status [ER-]: Secondary | ICD-10-CM

## 2023-01-22 DIAGNOSIS — C50919 Malignant neoplasm of unspecified site of unspecified female breast: Secondary | ICD-10-CM

## 2023-01-22 DIAGNOSIS — Z5111 Encounter for antineoplastic chemotherapy: Secondary | ICD-10-CM | POA: Insufficient documentation

## 2023-01-22 DIAGNOSIS — Z79899 Other long term (current) drug therapy: Secondary | ICD-10-CM | POA: Diagnosis not present

## 2023-01-22 MED ORDER — FULVESTRANT 250 MG/5ML IM SOSY
500.0000 mg | PREFILLED_SYRINGE | Freq: Once | INTRAMUSCULAR | Status: AC
  Administered 2023-01-22: 500 mg via INTRAMUSCULAR
  Filled 2023-01-22: qty 10

## 2023-01-26 ENCOUNTER — Other Ambulatory Visit: Payer: PPO

## 2023-01-26 DIAGNOSIS — I10 Essential (primary) hypertension: Secondary | ICD-10-CM | POA: Diagnosis not present

## 2023-01-27 LAB — COMPREHENSIVE METABOLIC PANEL
ALT: 11 IU/L (ref 0–32)
AST: 12 IU/L (ref 0–40)
Albumin/Globulin Ratio: 1.3 (ref 1.2–2.2)
Albumin: 3.9 g/dL (ref 3.8–4.8)
Alkaline Phosphatase: 126 IU/L — ABNORMAL HIGH (ref 44–121)
BUN/Creatinine Ratio: 14 (ref 12–28)
BUN: 12 mg/dL (ref 8–27)
Bilirubin Total: 0.7 mg/dL (ref 0.0–1.2)
CO2: 23 mmol/L (ref 20–29)
Calcium: 9.9 mg/dL (ref 8.7–10.3)
Chloride: 105 mmol/L (ref 96–106)
Creatinine, Ser: 0.87 mg/dL (ref 0.57–1.00)
Globulin, Total: 2.9 g/dL (ref 1.5–4.5)
Glucose: 95 mg/dL (ref 70–99)
Potassium: 4.3 mmol/L (ref 3.5–5.2)
Sodium: 142 mmol/L (ref 134–144)
Total Protein: 6.8 g/dL (ref 6.0–8.5)
eGFR: 71 mL/min/{1.73_m2} (ref >59)

## 2023-02-19 ENCOUNTER — Inpatient Hospital Stay: Payer: PPO | Attending: Oncology

## 2023-02-19 ENCOUNTER — Other Ambulatory Visit: Payer: Self-pay

## 2023-02-19 VITALS — BP 136/8 | HR 99 | Temp 98.8°F | Resp 18

## 2023-02-19 DIAGNOSIS — Z5111 Encounter for antineoplastic chemotherapy: Secondary | ICD-10-CM | POA: Diagnosis not present

## 2023-02-19 DIAGNOSIS — C50812 Malignant neoplasm of overlapping sites of left female breast: Secondary | ICD-10-CM | POA: Diagnosis not present

## 2023-02-19 DIAGNOSIS — Z95828 Presence of other vascular implants and grafts: Secondary | ICD-10-CM

## 2023-02-19 DIAGNOSIS — C50919 Malignant neoplasm of unspecified site of unspecified female breast: Secondary | ICD-10-CM

## 2023-02-19 DIAGNOSIS — Z171 Estrogen receptor negative status [ER-]: Secondary | ICD-10-CM

## 2023-02-19 DIAGNOSIS — Z79899 Other long term (current) drug therapy: Secondary | ICD-10-CM | POA: Diagnosis not present

## 2023-02-19 MED ORDER — FULVESTRANT 250 MG/5ML IM SOSY
500.0000 mg | PREFILLED_SYRINGE | Freq: Once | INTRAMUSCULAR | Status: AC
  Administered 2023-02-19: 500 mg via INTRAMUSCULAR
  Filled 2023-02-19: qty 10

## 2023-02-22 ENCOUNTER — Other Ambulatory Visit: Payer: Self-pay | Admitting: Hematology and Oncology

## 2023-02-22 DIAGNOSIS — R0609 Other forms of dyspnea: Secondary | ICD-10-CM

## 2023-02-22 DIAGNOSIS — I10 Essential (primary) hypertension: Secondary | ICD-10-CM

## 2023-02-23 ENCOUNTER — Encounter: Payer: Self-pay | Admitting: Hematology and Oncology

## 2023-02-25 ENCOUNTER — Telehealth: Payer: Self-pay | Admitting: Hematology and Oncology

## 2023-02-25 NOTE — Telephone Encounter (Signed)
Spoke with patient confirming upcoming appointment  

## 2023-03-06 ENCOUNTER — Ambulatory Visit
Admission: RE | Admit: 2023-03-06 | Discharge: 2023-03-06 | Disposition: A | Discharge status: Home / Self Care | Payer: PPO | Source: Ambulatory Visit | Attending: Hematology and Oncology | Admitting: Hematology and Oncology

## 2023-03-06 DIAGNOSIS — Z1231 Encounter for screening mammogram for malignant neoplasm of breast: Secondary | ICD-10-CM | POA: Diagnosis not present

## 2023-03-06 DIAGNOSIS — C50412 Malignant neoplasm of upper-outer quadrant of left female breast: Secondary | ICD-10-CM

## 2023-03-06 DIAGNOSIS — Z171 Estrogen receptor negative status [ER-]: Secondary | ICD-10-CM

## 2023-03-12 DIAGNOSIS — K760 Fatty (change of) liver, not elsewhere classified: Secondary | ICD-10-CM | POA: Diagnosis not present

## 2023-03-12 DIAGNOSIS — R748 Abnormal levels of other serum enzymes: Secondary | ICD-10-CM | POA: Diagnosis not present

## 2023-03-12 DIAGNOSIS — K743 Primary biliary cirrhosis: Secondary | ICD-10-CM | POA: Diagnosis not present

## 2023-03-20 ENCOUNTER — Inpatient Hospital Stay: Payer: PPO

## 2023-03-20 ENCOUNTER — Inpatient Hospital Stay: Payer: PPO | Admitting: Hematology and Oncology

## 2023-03-25 ENCOUNTER — Other Ambulatory Visit: Payer: Self-pay | Admitting: *Deleted

## 2023-03-25 DIAGNOSIS — Z171 Estrogen receptor negative status [ER-]: Secondary | ICD-10-CM

## 2023-03-26 ENCOUNTER — Inpatient Hospital Stay: Payer: PPO

## 2023-03-26 ENCOUNTER — Inpatient Hospital Stay: Payer: PPO | Attending: Hematology and Oncology

## 2023-03-26 ENCOUNTER — Inpatient Hospital Stay (HOSPITAL_BASED_OUTPATIENT_CLINIC_OR_DEPARTMENT_OTHER): Payer: PPO | Admitting: Hematology and Oncology

## 2023-03-26 ENCOUNTER — Encounter: Payer: Self-pay | Admitting: Hematology and Oncology

## 2023-03-26 VITALS — BP 147/94 | HR 83 | Temp 98.1°F | Resp 20 | Wt 237.1 lb

## 2023-03-26 DIAGNOSIS — C50812 Malignant neoplasm of overlapping sites of left female breast: Secondary | ICD-10-CM | POA: Insufficient documentation

## 2023-03-26 DIAGNOSIS — Z5111 Encounter for antineoplastic chemotherapy: Secondary | ICD-10-CM | POA: Diagnosis not present

## 2023-03-26 DIAGNOSIS — C50919 Malignant neoplasm of unspecified site of unspecified female breast: Secondary | ICD-10-CM

## 2023-03-26 DIAGNOSIS — Z79899 Other long term (current) drug therapy: Secondary | ICD-10-CM | POA: Insufficient documentation

## 2023-03-26 DIAGNOSIS — Z171 Estrogen receptor negative status [ER-]: Secondary | ICD-10-CM

## 2023-03-26 DIAGNOSIS — Z95828 Presence of other vascular implants and grafts: Secondary | ICD-10-CM

## 2023-03-26 DIAGNOSIS — Z1501 Genetic susceptibility to malignant neoplasm of breast: Secondary | ICD-10-CM

## 2023-03-26 DIAGNOSIS — C50412 Malignant neoplasm of upper-outer quadrant of left female breast: Secondary | ICD-10-CM | POA: Diagnosis not present

## 2023-03-26 LAB — CBC WITH DIFFERENTIAL (CANCER CENTER ONLY)
Abs Immature Granulocytes: 0.01 10*3/uL (ref 0.00–0.07)
Basophils Absolute: 0 10*3/uL (ref 0.0–0.1)
Basophils Relative: 1 %
Eosinophils Absolute: 0.1 10*3/uL (ref 0.0–0.5)
Eosinophils Relative: 3 %
HCT: 36 % (ref 36.0–46.0)
Hemoglobin: 11.7 g/dL — ABNORMAL LOW (ref 12.0–15.0)
Immature Granulocytes: 0 %
Lymphocytes Relative: 35 %
Lymphs Abs: 1.5 10*3/uL (ref 0.7–4.0)
MCH: 29.7 pg (ref 26.0–34.0)
MCHC: 32.5 g/dL (ref 30.0–36.0)
MCV: 91.4 fL (ref 80.0–100.0)
Monocytes Absolute: 0.3 10*3/uL (ref 0.1–1.0)
Monocytes Relative: 8 %
Neutro Abs: 2.4 10*3/uL (ref 1.7–7.7)
Neutrophils Relative %: 53 %
Platelet Count: 283 10*3/uL (ref 150–400)
RBC: 3.94 MIL/uL (ref 3.87–5.11)
RDW: 12.9 % (ref 11.5–15.5)
WBC Count: 4.4 10*3/uL (ref 4.0–10.5)
nRBC: 0 % (ref 0.0–0.2)

## 2023-03-26 LAB — CMP (CANCER CENTER ONLY)
ALT: 12 U/L (ref 0–44)
AST: 12 U/L — ABNORMAL LOW (ref 15–41)
Albumin: 3.8 g/dL (ref 3.5–5.0)
Alkaline Phosphatase: 114 U/L (ref 38–126)
Anion gap: 8 (ref 5–15)
BUN: 14 mg/dL (ref 8–23)
CO2: 25 mmol/L (ref 22–32)
Calcium: 10 mg/dL (ref 8.9–10.3)
Chloride: 107 mmol/L (ref 98–111)
Creatinine: 0.9 mg/dL (ref 0.44–1.00)
GFR, Estimated: >60 mL/min (ref >60)
Glucose, Bld: 89 mg/dL (ref 70–99)
Potassium: 3.8 mmol/L (ref 3.5–5.1)
Sodium: 140 mmol/L (ref 135–145)
Total Bilirubin: 1 mg/dL (ref 0.3–1.2)
Total Protein: 7.4 g/dL (ref 6.5–8.1)

## 2023-03-26 MED ORDER — FULVESTRANT 250 MG/5ML IM SOSY
500.0000 mg | PREFILLED_SYRINGE | Freq: Once | INTRAMUSCULAR | Status: AC
  Administered 2023-03-26: 500 mg via INTRAMUSCULAR
  Filled 2023-03-26: qty 10

## 2023-03-26 NOTE — Progress Notes (Signed)
[facility]  Telephone:([phone]) [phone] Fax:(336) [phone]    ID: [name] DOB: 01/05/1951  MR#: [id number]  JYN#:[id number]  Patient Care Team: Ruth Felts, FNP as PCP - General (General Practice) Lopez, Ruth Hue, MD (Inactive) as Consulting Physician (Oncology) Ruth Loron, MD as Consulting Physician (General Surgery) Ruth Blackbird, MD as Consulting Physician (Radiation Oncology) Ruth Guest, MD as Consulting Physician (Ophthalmology)   CHIEF COMPLAINT: triple negative breast cancer; PALB2 positive (s/p left mastectomy)  CURRENT TREATMENT: fulvestrant  INTERVAL HISTORY:  [name] returns today for follow up and treatment of her recurrent left breast cancer. She is currently on adjuvant Faslodex.  She has been tolerating this extremely well.  Her only complaint today is that her urine has malodor for a day or 2 after her Faslodex injection.  She otherwise denies any dysuria or suprapubic pain with this.  She denies any breast changes.  She had her mammogram which did not show any concerns.  She herself denies any palpable lumps in the breast.  No change in breathing, persistent cough, change in bowel habits, change in urinary habits or neurological complaints.  Rest of the pertinent 10 point ROS reviewed and negative   COVID 19 VACCINATION STATUS: fully vaccinated [name]), with booster 11/2020   BREAST CANCER HISTORY: From the original intake note:  [name] herself palpated a mass in her left breast, and immediately brought it to the attention of her primary care physician, [name], who confirmed the finding and setup the patient for bilateral diagnostic mammography at the breast Center 02/20/2014. This showed a high density mass in the outer left breast measuring up to 3.7 cm. It corresponded to the site of palpable concern. In addition the normal 4 logically abnormal lymph nodes in the left axilla. The mass was palpable by exam, and by  ultrasonography measured 2.6 cm. There were enlarged and morphologically abnormal lymph nodes in the left axilla, largest measuring 3 cm.  Biopsy of the left breast mass in question and 1 of the abnormal axillary lymph nodes 02/22/2014 showed ([id number]) biopsies to be positive for an invasive ductal carcinoma, grade 3, triple negative, with an MIB-1 of 90%. Specifically the HER-2 signals ratio was 1.05, and the number per cell 2.00.  MRI of the breasts 03/01/2014 showed a 3.8 cm enhancing mass with areas of apparent necrosis in the left breast, as well as the biopsy tract extending laterally, the combined measure 5.3 cm. There were no other masses or areas of abnormal enhancement. There were multiple abnormally enlarged left axillary lymph nodes with loss of the normal fatty hilum. These included level I and retropectoral lymph nodes.  The patient's subsequent history is as detailed below   PAST MEDICAL HISTORY: Past Medical History:  Diagnosis Date   Colon cancer (HCC)    Hx of rotator cuff surgery    Hypertension    left breast cancer    Personal history of chemotherapy 2016   left   Personal history of radiation therapy 2016   left breast    Radiation 11/15/14-01/02/15   left breast, axillary and supraclavicular region 45 gray, lumpectomy cavity boosted to 16 gray    PAST SURGICAL HISTORY: Past Surgical History:  Procedure Laterality Date   ABDOMINAL HYSTERECTOMY  1988   BREAST EXCISIONAL BIOPSY Right 12/21/2017   BREAST LUMPECTOMY Left 2016   COLON SURGERY  1988   colectomy/colostomy-ca   COLON SURGERY  1988   colostomy takedown-reversal   COLONOSCOPY     EXCISION /  BIOPSY BREAST / NIPPLE / DUCT Left 09/20/2014   IR IMAGING GUIDED PORT INSERTION  03/22/2020   MASTECTOMY Left 02/14/2020   MASTECTOMY W/ SENTINEL NODE BIOPSY Left 02/14/2020   Procedure: LEFT MASTECTOMY WITH BLUE DYE INJECTION;  Surgeon: Ruth Loron, MD;  [facility] OR;  Service: General;  Laterality:  Left;  PEC BLOCK   RADIOACTIVE SEED GUIDED EXCISIONAL BREAST BIOPSY Right 12/21/2017   Procedure: RIGHT RADIOACTIVE SEED GUIDED EXCISIONAL BREAST BIOPSY ERAS PATHWAY;  Surgeon: Ruth Loron, MD;  Location: [facility];  Service: General;  Laterality: Right;   RADIOACTIVE SEED GUIDED PARTIAL MASTECTOMY WITH AXILLARY SENTINEL LYMPH NODE BIOPSY Left 09/20/2014   Procedure: RADIOACTIVE SEED GUIDED LEFT BREAST LUMPECTOMY WITH AXILLARY SENTINEL LYMPH NODE BIOPSY;  Surgeon: Ruth Loron, MD;  Location: [facility];  Service: General;  Laterality: Left;   TOTAL SHOULDER ARTHROPLASTY  2009   right    FAMILY HISTORY Family History  Problem Relation Age of Onset   Cancer Mother 38       breast   Breast cancer Mother        early 5s   Stroke Father 58       cause of death   Diabetes Father    Cancer Maternal Aunt        breast cancer at unknown age   Breast cancer Maternal Aunt    Ovarian cancer Neg Hx    the patient's father died at the age of 33 following a stroke in the setting of diabetes; the patient's mother is living at 70. The patient has 3 brothers and 2 sisters. [name], [name], was diagnosed with breast cancer in her early 63s. There is no other breast or ovarian cancer in the family. The patient herself was diagnosed with watts by history he is an incidentally found stage I colon carcinoma noted at the time of her hysterectomy. She had a partial colectomy but no adjuvant treatment. We have no records regarding that procedure   GYNECOLOGIC HISTORY:  No LMP recorded. Patient has had a hysterectomy. Menarche age 38, first live birth age 27, the patient underwent total abdominal hysterectomy with bilateral salpingo-oophorectomy in her 31s. She did not take hormone replacement. She did not take oral contraceptives at any point.   SOCIAL HISTORY: (Updated May 2021) [name] worked for [employer] for about 40 years, retiring in 2008. She is  divorced and lives alone, with no pets.  Her grandson who is a Sports administrator frequently stays with her.  Her daughter Evynn Boutelle works for [employer] in Audiological scientist. The second daughter, Kiante Petrovich, works as a Conservation officer, nature at [employer].  The patient has 6 grandchildren and three great-grandchildren. She attends a [employer].     ADVANCED DIRECTIVES: Not in place. The patient intends to name her daughter Junie Spencer. her healthcare power of attorney. Verlon Au is work number is [phone]. On the patient's 03/01/2014 visit she was given a copy of the appropriate documents to complete and notarize at her discretion.     HEALTH MAINTENANCE: Social History   Tobacco Use   Smoking status: Never   Smokeless tobacco: Never  Vaping Use   Vaping status: Never Used  Substance Use Topics   Alcohol use: Not Currently    Comment: occ-rare   Drug use: No     Colonoscopy: 2000  PAP: May 2015  Bone density: 12/21/2017, T-score of -0.6  Lipid panel: 02/03/2014  No Known Allergies  Current Outpatient Medications  Medication Sig Dispense Refill   acetaminophen (TYLENOL) 500 MG tablet Take 1,000 mg by mouth every 6 (six) hours as needed for mild pain or moderate pain.     gabapentin (NEURONTIN) 300 MG capsule Take 1 capsule (300 mg total) by mouth at bedtime. 90 capsule 4   hydroxypropyl methylcellulose / hypromellose (ISOPTO TEARS / GONIOVISC) 2.5 % ophthalmic solution Place 1 drop into both eyes daily as needed for dry eyes.     Liniments (SALONPAS ARTHRITIS PAIN RELIEF EX) Apply 1 application topically daily as needed (knee pain).     lisinopril-hydrochlorothiazide (ZESTORETIC) 20-12.5 MG tablet TAKE 1/2 TABLET BY MOUTH DAILY 45 tablet 0   LUMIGAN 0.01 % SOLN      Multiple Vitamins-Minerals (AIRBORNE PO) Take 1 tablet by mouth 2 (two) times a week.     ursodiol (ACTIGALL) 500 MG tablet Take 500 mg by mouth 3 (three) times daily.      No current facility-administered medications  for this visit.    OBJECTIVE: African-American woman who appears older than stated age There were no vitals filed for this visit.   There were no vitals filed for this visit.   There is no height or weight on file to calculate BMI.     ECOG FS:1 - Symptomatic but completely ambulatory   Physical Exam Constitutional:      Appearance: Normal appearance.  Cardiovascular:     Rate and Rhythm: Normal rate and regular rhythm.     Pulses: Normal pulses.     Heart sounds: Normal heart sounds.  Chest:     Comments: Bilateral breasts inspected.  Left breast status postmastectomy.  Right breast with no palpable masses or adenopathy.  Abdominal:     General: There is no distension.     Tenderness: There is no abdominal tenderness.  Musculoskeletal:     Cervical back: Normal range of motion and neck supple. No rigidity.  Lymphadenopathy:     Cervical: No cervical adenopathy.  Skin:    General: Skin is warm and dry.  Neurological:     General: No focal deficit present.     Mental Status: She is alert.  Psychiatric:        Mood and Affect: Mood normal.    LAB RESULTS:  No results found for: "SPEP", "UPEP"  Lab Results  Component Value Date   WBC 4.4 03/26/2023   NEUTROABS 2.4 03/26/2023   HGB 11.7 (L) 03/26/2023   HCT 36.0 03/26/2023   MCV 91.4 03/26/2023   PLT 283 03/26/2023      Chemistry      Component Value Date/Time   NA 140 03/26/2023 1319   NA 142 01/26/2023 1108   NA 142 11/13/2016 1050   K 3.8 03/26/2023 1319   K 4.1 11/13/2016 1050   CL 107 03/26/2023 1319   CO2 25 03/26/2023 1319   CO2 27 11/13/2016 1050   BUN 14 03/26/2023 1319   BUN 12 01/26/2023 1108   BUN 11.5 11/13/2016 1050   CREATININE 0.90 03/26/2023 1319   CREATININE 1.0 11/13/2016 1050      Component Value Date/Time   CALCIUM 10.0 03/26/2023 1319   CALCIUM 9.8 11/13/2016 1050   ALKPHOS 114 03/26/2023 1319   ALKPHOS 140 11/13/2016 1050   AST 12 (L) 03/26/2023 1319   AST 13 11/13/2016  1050   ALT 12 03/26/2023 1319   ALT 15 11/13/2016 1050   BILITOT 1.0 03/26/2023 1319   BILITOT 0.86 11/13/2016 1050  No results found for: "LABCA2"  No components found for: "LABCA125"  No results for input(s): "INR" in the last 168 hours.  Urinalysis    Component Value Date/Time   COLORURINE YELLOW 04/05/2008 1120   APPEARANCEUR CLEAR 04/05/2008 1120   LABSPEC 1.021 04/05/2008 1120   PHURINE 6.0 04/05/2008 1120   GLUCOSEU NEGATIVE 04/05/2008 1120   HGBUR NEGATIVE 04/05/2008 1120   BILIRUBINUR negative 12/12/2020 1236   KETONESUR NEGATIVE 04/05/2008 1120   PROTEINUR Negative 12/12/2020 1236   PROTEINUR NEGATIVE 04/05/2008 1120   UROBILINOGEN 0.2 12/12/2020 1236   UROBILINOGEN 0.2 04/05/2008 1120   NITRITE negative 12/12/2020 1236   NITRITE NEGATIVE 04/05/2008 1120   LEUKOCYTESUR Negative 12/12/2020 1236    STUDIES: MM 3D SCREEN BREAST UNI RIGHT  Result Date: 03/10/2023 CLINICAL DATA:  Screening. EXAM: DIGITAL SCREENING UNILATERAL RIGHT MAMMOGRAM WITH CAD AND TOMOSYNTHESIS TECHNIQUE: Right screening digital craniocaudal and mediolateral oblique mammograms were obtained. Right screening digital breast tomosynthesis was performed. The images were evaluated with computer-aided detection. COMPARISON:  Previous exam(s). ACR Breast Density Category b: There are scattered areas of fibroglandular density. FINDINGS: There are no findings suspicious for malignancy. Status post LEFT mastectomy. IMPRESSION: No mammographic evidence of malignancy. A result letter of this screening mammogram will be mailed directly to the patient. RECOMMENDATION: Screening mammogram in one year. (Code:SM-B-01Y) BI-RADS CATEGORY  1: Negative. Electronically Signed   By: Meda Klinefelter M.D.   On: 03/10/2023 11:04      ASSESSMENT: 72 y.o. BRCA negative [place] woman s/p left breast upper outer quadrant and left axillary lymph node biopsy 02/22/2014, both positive for a clinical T2 N2, stage IIIA  invasive ductal carcinoma, grade 3, triple negative, with an MIB-1 of 90%  (1) neoadjuvant chemotherapy started 03/13/2014, with cyclophosphamide and doxorubicin in dose dense fashion x4, with Neulasta support on day 2, completed 04/24/2014, followed by weekly carboplatin and paclitaxel x12, paclitaxel reduced during cycle 5 and cycle 7 because of elevated bilirubin levels  (2) left lumpectomy and sentinel lymph node sampling 09/20/2014 showed a complete pathologic response.  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, showed no deleterious mutations.  (b) PALB2 mutations are known to increase the risk of breast and pancreatic cancer, possibly other cancers, but the data is preliminary  (c) the patient's 2 daughters have been tested and did not carry the gene  (d) Tekisha's MSH2 VUS has been reclassified as "likely benign"  (e) she is status post remote TAH/BSO  (f) intensified screening, with mammography in February and breast MRI in August planned  (6) staging studies showed right-sided lung nodules, too small to characterize, and a dominant right-sided thyroid nodule unchanged in size as compared to 2011; to be followed  (a) chest CT 04/24/2016 showed the small pulmonary nodules to be completely stable, consistent with benign findings.  (b) thyroid ultrasound--done on 07/22/2018 showed enlarged heterogeneous lobular and multinodular thyroid gland, TI-RADS category 3 nodules, not meeting criteria for further biopsy or dedicated imaging f/u.    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  (8) anastrozole  started 07/08/2018 for breast cancer prevention, discontinued 01/30/2020 with disease recurrence  (a) bone density scan 01/01/2018 normal, T  score -0.6  (9) RECURRENT DISEASE May 2021  (a) left breast overlapping biopsy 01/20/2020 shows a clinical  T2 N0, stage IIB invasive ductal carcinoma, grade 2 or 3, with moderate estrogen receptor positivity, progesterone receptor negative, HER-2 not amplified, MIB-1 of 80%  (b) left mastectomy on 02/14/2020 showed a pT2 pNX, invasive ductal carcinoma, grade 3, with negative margins   (c) CT of the chest and bone scan on 02/08/2020 shows no distant metastases  (10) started fulvestrant and palbociclib 02/24/2020 discontinued on 03/08/2020  (a) Oncotype sent on surgical sample shows score of 60 indicating a greater than 29% risk of recurrence on antiestrogens alone, and also predicting a significant benefit from chemotherapy.  (b) the Oncotype test found this tumor to be triple negative by single gene scores.   (11) cyclophosphamide, methotrexate, and fluorouracil [CMF] every 28 days x 8 cycles given from 03/27/2020-08/22/2020.  (12) fulvestrant resumed 10/03/2020   PLAN:  Ms. Ofelia is now here for for follow-up on adjuvant Faslodex.  She started Faslodex in January 2022.  She is tolerating this very well.  Anticipate completion of 5 years of antiestrogen therapy in January 2027. She is now 3 years out from her recurrent breast cancer.  No review of systems or physical exam findings to be concerned about metastatic breast cancer.  Her last mammogram was unremarkable. Screening mammogram ordered for June 2025. Return to clinic in 6 months for further evaluation or sooner as needed.  Total time spent: 30 minutes  *Total Encounter Time as defined by the Centers for Medicare and Medicaid Services includes, in addition to the face-to-face time of a patient visit (documented in the note above) non-face-to-face time: obtaining and reviewing outside history, ordering  and reviewing medications, tests or procedures, care coordination (communications with other health care professionals or caregivers) and documentation in the medical record.

## 2023-03-28 ENCOUNTER — Telehealth: Payer: Self-pay | Admitting: Hematology and Oncology

## 2023-03-28 NOTE — Telephone Encounter (Signed)
Contacted patient to scheduled appointments. Left message with appointment details and a call back number if patient had any questions or could not accommodate the time we provided.   

## 2023-04-23 ENCOUNTER — Inpatient Hospital Stay: Payer: PPO

## 2023-04-27 ENCOUNTER — Inpatient Hospital Stay: Payer: PPO | Attending: Hematology and Oncology

## 2023-04-27 ENCOUNTER — Other Ambulatory Visit: Payer: Self-pay

## 2023-04-27 ENCOUNTER — Telehealth: Payer: Self-pay | Admitting: Hematology and Oncology

## 2023-04-27 VITALS — BP 146/86 | HR 100 | Temp 98.9°F | Resp 18

## 2023-04-27 DIAGNOSIS — C50919 Malignant neoplasm of unspecified site of unspecified female breast: Secondary | ICD-10-CM

## 2023-04-27 DIAGNOSIS — C50812 Malignant neoplasm of overlapping sites of left female breast: Secondary | ICD-10-CM | POA: Diagnosis not present

## 2023-04-27 DIAGNOSIS — Z171 Estrogen receptor negative status [ER-]: Secondary | ICD-10-CM

## 2023-04-27 DIAGNOSIS — Z5111 Encounter for antineoplastic chemotherapy: Secondary | ICD-10-CM | POA: Insufficient documentation

## 2023-04-27 DIAGNOSIS — Z95828 Presence of other vascular implants and grafts: Secondary | ICD-10-CM

## 2023-04-27 DIAGNOSIS — Z79899 Other long term (current) drug therapy: Secondary | ICD-10-CM | POA: Insufficient documentation

## 2023-04-27 MED ORDER — FULVESTRANT 250 MG/5ML IM SOSY
500.0000 mg | PREFILLED_SYRINGE | Freq: Once | INTRAMUSCULAR | Status: AC
  Administered 2023-04-27: 500 mg via INTRAMUSCULAR
  Filled 2023-04-27: qty 10

## 2023-05-21 ENCOUNTER — Inpatient Hospital Stay: Payer: PPO

## 2023-05-25 ENCOUNTER — Other Ambulatory Visit: Payer: Self-pay | Admitting: Hematology and Oncology

## 2023-05-25 DIAGNOSIS — I10 Essential (primary) hypertension: Secondary | ICD-10-CM

## 2023-05-25 DIAGNOSIS — R0609 Other forms of dyspnea: Secondary | ICD-10-CM

## 2023-05-28 ENCOUNTER — Inpatient Hospital Stay: Payer: PPO | Attending: Hematology and Oncology

## 2023-05-28 VITALS — BP 125/69 | HR 89 | Temp 98.7°F | Resp 20

## 2023-05-28 DIAGNOSIS — Z95828 Presence of other vascular implants and grafts: Secondary | ICD-10-CM

## 2023-05-28 DIAGNOSIS — C50812 Malignant neoplasm of overlapping sites of left female breast: Secondary | ICD-10-CM | POA: Insufficient documentation

## 2023-05-28 DIAGNOSIS — Z5111 Encounter for antineoplastic chemotherapy: Secondary | ICD-10-CM | POA: Insufficient documentation

## 2023-05-28 DIAGNOSIS — C50412 Malignant neoplasm of upper-outer quadrant of left female breast: Secondary | ICD-10-CM

## 2023-05-28 DIAGNOSIS — Z79899 Other long term (current) drug therapy: Secondary | ICD-10-CM | POA: Insufficient documentation

## 2023-05-28 DIAGNOSIS — C50919 Malignant neoplasm of unspecified site of unspecified female breast: Secondary | ICD-10-CM

## 2023-05-28 MED ORDER — FULVESTRANT 250 MG/5ML IM SOSY
500.0000 mg | PREFILLED_SYRINGE | Freq: Once | INTRAMUSCULAR | Status: AC
  Administered 2023-05-28: 500 mg via INTRAMUSCULAR
  Filled 2023-05-28: qty 10

## 2023-06-18 ENCOUNTER — Inpatient Hospital Stay: Payer: PPO

## 2023-06-25 ENCOUNTER — Encounter: Payer: PPO | Admitting: Nurse Practitioner

## 2023-06-25 ENCOUNTER — Inpatient Hospital Stay: Payer: PPO | Attending: Hematology and Oncology

## 2023-06-25 VITALS — BP 141/97 | HR 84 | Resp 17

## 2023-06-25 DIAGNOSIS — C50812 Malignant neoplasm of overlapping sites of left female breast: Secondary | ICD-10-CM | POA: Insufficient documentation

## 2023-06-25 DIAGNOSIS — Z79899 Other long term (current) drug therapy: Secondary | ICD-10-CM | POA: Diagnosis not present

## 2023-06-25 DIAGNOSIS — Z5111 Encounter for antineoplastic chemotherapy: Secondary | ICD-10-CM | POA: Insufficient documentation

## 2023-06-25 DIAGNOSIS — C50919 Malignant neoplasm of unspecified site of unspecified female breast: Secondary | ICD-10-CM

## 2023-06-25 DIAGNOSIS — Z95828 Presence of other vascular implants and grafts: Secondary | ICD-10-CM

## 2023-06-25 DIAGNOSIS — Z171 Estrogen receptor negative status [ER-]: Secondary | ICD-10-CM

## 2023-06-25 MED ORDER — FULVESTRANT 250 MG/5ML IM SOSY
500.0000 mg | PREFILLED_SYRINGE | Freq: Once | INTRAMUSCULAR | Status: AC
  Administered 2023-06-25: 500 mg via INTRAMUSCULAR
  Filled 2023-06-25: qty 10

## 2023-06-25 NOTE — Patient Instructions (Signed)

## 2023-07-16 ENCOUNTER — Inpatient Hospital Stay: Payer: PPO

## 2023-07-20 ENCOUNTER — Telehealth: Payer: Self-pay | Admitting: Hematology and Oncology

## 2023-07-23 ENCOUNTER — Inpatient Hospital Stay: Payer: PPO

## 2023-07-23 ENCOUNTER — Inpatient Hospital Stay: Payer: PPO | Attending: Hematology and Oncology

## 2023-07-23 VITALS — BP 107/77 | HR 100 | Temp 98.7°F | Resp 20

## 2023-07-23 DIAGNOSIS — C50812 Malignant neoplasm of overlapping sites of left female breast: Secondary | ICD-10-CM | POA: Diagnosis not present

## 2023-07-23 DIAGNOSIS — Z5111 Encounter for antineoplastic chemotherapy: Secondary | ICD-10-CM | POA: Insufficient documentation

## 2023-07-23 DIAGNOSIS — C50919 Malignant neoplasm of unspecified site of unspecified female breast: Secondary | ICD-10-CM

## 2023-07-23 DIAGNOSIS — C50412 Malignant neoplasm of upper-outer quadrant of left female breast: Secondary | ICD-10-CM

## 2023-07-23 DIAGNOSIS — Z79899 Other long term (current) drug therapy: Secondary | ICD-10-CM | POA: Diagnosis not present

## 2023-07-23 DIAGNOSIS — Z95828 Presence of other vascular implants and grafts: Secondary | ICD-10-CM

## 2023-07-23 MED ORDER — FULVESTRANT 250 MG/5ML IM SOSY
500.0000 mg | PREFILLED_SYRINGE | Freq: Once | INTRAMUSCULAR | Status: AC
  Administered 2023-07-23: 500 mg via INTRAMUSCULAR
  Filled 2023-07-23: qty 10

## 2023-08-14 ENCOUNTER — Inpatient Hospital Stay: Payer: PPO

## 2023-08-20 ENCOUNTER — Inpatient Hospital Stay: Payer: PPO | Attending: Hematology and Oncology

## 2023-08-20 VITALS — BP 146/95 | HR 101 | Temp 98.1°F | Resp 20

## 2023-08-20 DIAGNOSIS — Z5111 Encounter for antineoplastic chemotherapy: Secondary | ICD-10-CM | POA: Insufficient documentation

## 2023-08-20 DIAGNOSIS — Z171 Estrogen receptor negative status [ER-]: Secondary | ICD-10-CM

## 2023-08-20 DIAGNOSIS — Z79899 Other long term (current) drug therapy: Secondary | ICD-10-CM | POA: Insufficient documentation

## 2023-08-20 DIAGNOSIS — Z95828 Presence of other vascular implants and grafts: Secondary | ICD-10-CM

## 2023-08-20 DIAGNOSIS — C50919 Malignant neoplasm of unspecified site of unspecified female breast: Secondary | ICD-10-CM

## 2023-08-20 DIAGNOSIS — C50812 Malignant neoplasm of overlapping sites of left female breast: Secondary | ICD-10-CM | POA: Diagnosis not present

## 2023-08-20 MED ORDER — FULVESTRANT 250 MG/5ML IM SOSY
500.0000 mg | PREFILLED_SYRINGE | Freq: Once | INTRAMUSCULAR | Status: AC
  Administered 2023-08-20: 500 mg via INTRAMUSCULAR

## 2023-08-22 ENCOUNTER — Other Ambulatory Visit: Payer: Self-pay | Admitting: Hematology and Oncology

## 2023-08-22 DIAGNOSIS — R0609 Other forms of dyspnea: Secondary | ICD-10-CM

## 2023-08-22 DIAGNOSIS — I10 Essential (primary) hypertension: Secondary | ICD-10-CM

## 2023-08-25 ENCOUNTER — Encounter: Payer: Self-pay | Admitting: Nurse Practitioner

## 2023-08-25 ENCOUNTER — Ambulatory Visit: Payer: PPO | Admitting: Nurse Practitioner

## 2023-08-25 VITALS — BP 130/70 | HR 85 | Temp 98.5°F | Ht 61.0 in | Wt 239.2 lb

## 2023-08-25 DIAGNOSIS — C50412 Malignant neoplasm of upper-outer quadrant of left female breast: Secondary | ICD-10-CM

## 2023-08-25 DIAGNOSIS — Z0000 Encounter for general adult medical examination without abnormal findings: Secondary | ICD-10-CM | POA: Diagnosis not present

## 2023-08-25 DIAGNOSIS — Z2821 Immunization not carried out because of patient refusal: Secondary | ICD-10-CM | POA: Diagnosis not present

## 2023-08-25 DIAGNOSIS — Z79899 Other long term (current) drug therapy: Secondary | ICD-10-CM | POA: Diagnosis not present

## 2023-08-25 DIAGNOSIS — R7303 Prediabetes: Secondary | ICD-10-CM | POA: Diagnosis not present

## 2023-08-25 DIAGNOSIS — Z85038 Personal history of other malignant neoplasm of large intestine: Secondary | ICD-10-CM | POA: Diagnosis not present

## 2023-08-25 DIAGNOSIS — Z13220 Encounter for screening for lipoid disorders: Secondary | ICD-10-CM | POA: Diagnosis not present

## 2023-08-25 DIAGNOSIS — I10 Essential (primary) hypertension: Secondary | ICD-10-CM

## 2023-08-25 DIAGNOSIS — C773 Secondary and unspecified malignant neoplasm of axilla and upper limb lymph nodes: Secondary | ICD-10-CM

## 2023-08-25 DIAGNOSIS — Z171 Estrogen receptor negative status [ER-]: Secondary | ICD-10-CM | POA: Diagnosis not present

## 2023-08-25 LAB — POCT URINALYSIS DIP (CLINITEK)
Bilirubin, UA: NEGATIVE
Blood, UA: NEGATIVE
Glucose, UA: NEGATIVE mg/dL
Ketones, POC UA: NEGATIVE mg/dL
Leukocytes, UA: NEGATIVE
Nitrite, UA: NEGATIVE
POC PROTEIN,UA: NEGATIVE
Spec Grav, UA: >=1.03 — AB (ref 1.010–1.025)
Urobilinogen, UA: 0.2 U/dL
pH, UA: 6 (ref 5.0–8.0)

## 2023-08-25 NOTE — Assessment & Plan Note (Addendum)
Blood pressure is fairly controlled, continue current medications. EKG done with Sinus Tachycardia with HR 101

## 2023-08-25 NOTE — Progress Notes (Signed)
[name], CMA,acting as a Neurosurgeon for Ruth Felts, FNP.,have documented all relevant documentation on the behalf of Ruth Felts, FNP,as directed by  Ruth Felts, FNP while in the presence of Ruth Felts, FNP.  Subjective:    [name] , female    DOB: 04/21/1951 , 72 y.o.   MRN: [id number]  Chief Complaint  Patient presents with   Annual Exam    HPI  Patient presents today for HM, Patient reports compliance with medication. Patient denies any chest pain, SOB, or headaches. Patient has no concerns today. She continues to have infusions at the Oncologist     Past Medical History:  Diagnosis Date   Colon cancer (HCC)    Hx of rotator cuff surgery    Hypertension    left breast cancer    Personal history of chemotherapy 2016   left   Personal history of radiation therapy 2016   left breast    Radiation 11/15/14-01/02/15   left breast, axillary and supraclavicular region 45 gray, lumpectomy cavity boosted to 16 gray     Family History  Problem Relation Age of Onset   Cancer Mother 84       breast   Breast cancer Mother        early 38s   Stroke Father 34       cause of death   Diabetes Father    Cancer Maternal Aunt        breast cancer at unknown age   Breast cancer Maternal Aunt    Ovarian cancer Neg Hx      Current Outpatient Medications:    acetaminophen (TYLENOL) 500 MG tablet, Take 1,000 mg by mouth every 6 (six) hours as needed for mild pain or moderate pain., Disp: , Rfl:    gabapentin (NEURONTIN) 300 MG capsule, Take 1 capsule (300 mg total) by mouth at bedtime., Disp: 90 capsule, Rfl: 4   hydroxypropyl methylcellulose / hypromellose (ISOPTO TEARS / GONIOVISC) 2.5 % ophthalmic solution, Place 1 drop into both eyes daily as needed for dry eyes., Disp: , Rfl:    Liniments (SALONPAS ARTHRITIS PAIN RELIEF EX), Apply 1 application topically daily as needed (knee pain)., Disp: , Rfl:    LUMIGAN 0.01 % SOLN, , Disp: , Rfl:    Multiple  Vitamins-Minerals (AIRBORNE PO), Take 1 tablet by mouth 2 (two) times a week., Disp: , Rfl:    ursodiol (ACTIGALL) 500 MG tablet, Take 500 mg by mouth 3 (three) times daily. , Disp: , Rfl:    lisinopril-hydrochlorothiazide (ZESTORETIC) 20-12.5 MG tablet, TAKE 1/2 TABLET BY MOUTH DAILY, Disp: 45 tablet, Rfl: 0   No Known Allergies    The patient states she is status post hysterectomy.  Negative for Dysmenorrhea and Negative for Menorrhagia. Negative for: breast discharge, breast lump(s), breast pain and breast self exam. Associated symptoms include abnormal vaginal bleeding. Pertinent negatives include abnormal bleeding (hematology), anxiety, decreased libido, depression, difficulty falling sleep, dyspareunia, history of infertility, nocturia, sexual dysfunction, sleep disturbances, urinary incontinence, urinary urgency, vaginal discharge and vaginal itching. Diet regular; she has good and bad days. The patient states her exercise level is minimal - had been going to the gym for about 4-5 months until something came up and got out of the habit, was doing water exercise.    . The patient's tobacco use is:  Social History   Tobacco Use  Smoking Status Never  Smokeless Tobacco Never   She has been exposed to passive smoke. The  patient's alcohol use is:  Social History   Substance and Sexual Activity  Alcohol Use Not Currently   Comment: occ-rare    Review of Systems  Constitutional: Negative.   HENT: Negative.    Eyes: Negative.   Respiratory: Negative.    Cardiovascular: Negative.   Gastrointestinal: Negative.   Endocrine: Negative.   Genitourinary: Negative.   Musculoskeletal: Negative.   Skin: Negative.   Allergic/Immunologic: Negative.   Neurological: Negative.   Hematological: Negative.   Psychiatric/Behavioral: Negative.       Today's Vitals   08/25/23 1141  BP: 130/70  Pulse: 85  Temp: 98.5 F (36.9 C)  TempSrc: Oral  Weight: 239 lb 3.2 oz (108.5 kg)  Height: 5\' 1"   (1.549 m)  PainSc: 0-No pain   Body mass index is 45.2 kg/m.  Wt Readings from Last 3 Encounters:  08/25/23 239 lb 3.2 oz (108.5 kg)  03/26/23 237 lb 1.6 oz (107.5 kg)  12/24/22 230 lb (104.3 kg)     Objective:  Physical Exam Constitutional:      General: She is not in acute distress.    Appearance: Normal appearance. She is well-developed. She is obese.  HENT:     Head: Normocephalic and atraumatic.     Right Ear: Hearing, tympanic membrane, ear canal and external ear normal. There is no impacted cerumen.     Left Ear: Hearing, tympanic membrane, ear canal and external ear normal. There is no impacted cerumen.     Nose: Nose normal.     Mouth/Throat:     Mouth: Mucous membranes are moist.  Eyes:     General: Lids are normal.     Extraocular Movements: Extraocular movements intact.     Conjunctiva/sclera: Conjunctivae normal.     Pupils: Pupils are equal, round, and reactive to light.     Funduscopic exam:    Right eye: No papilledema.        Left eye: No papilledema.  Neck:     Thyroid: No thyroid mass.     Vascular: No carotid bruit.  Cardiovascular:     Rate and Rhythm: Normal rate and regular rhythm.     Pulses: Normal pulses.     Heart sounds: Normal heart sounds. No murmur heard. Pulmonary:     Effort: Pulmonary effort is normal. No respiratory distress.     Breath sounds: Normal breath sounds. No wheezing.  Chest:     Chest wall: No mass.  Breasts:    Tanner Score is 5.     Right: Normal. No mass or tenderness.     Left: Absent. No mass or tenderness.  Abdominal:     General: Abdomen is flat. Bowel sounds are normal. There is no distension.     Palpations: Abdomen is soft.     Tenderness: There is no abdominal tenderness.  Musculoskeletal:        General: No swelling. Normal range of motion.     Cervical back: Full passive range of motion without pain, normal range of motion and neck supple.     Right lower leg: No edema.     Left lower leg: No edema.      Comments: Uses cane for ambulation  Lymphadenopathy:     Upper Body:     Right upper body: No supraclavicular, axillary or pectoral adenopathy.     Left upper body: No supraclavicular, axillary or pectoral adenopathy.  Skin:    General: Skin is warm and dry.     Capillary Refill:  Capillary refill takes less than 2 seconds.  Neurological:     General: No focal deficit present.     Mental Status: She is alert and oriented to person, place, and time.     Cranial Nerves: No cranial nerve deficit.     Sensory: No sensory deficit.     Motor: No weakness.  Psychiatric:        Mood and Affect: Mood normal.        Behavior: Behavior normal.        Thought Content: Thought content normal.        Judgment: Judgment normal.         Assessment And Plan:     Encounter for annual health examination Assessment & Plan: Behavior modifications discussed and diet history reviewed.   Pt will continue to exercise regularly and modify diet with low GI, plant based foods and decrease intake of processed foods.  Recommend intake of daily multivitamin, Vitamin D, and calcium.  Recommend mammogram and she had a cologuard in 2023 for preventive screenings, as well as recommend immunizations that include influenza, TDAP, and Shingles    Encounter for screening for lipid disorder -     Lipid panel  Essential hypertension Assessment & Plan: Blood pressure is fairly controlled, continue current medications. EKG done with Sinus Tachycardia with HR 101  Orders: -     EKG 12-Lead -     POCT URINALYSIS DIP (CLINITEK) -     Microalbumin / creatinine urine ratio -     CMP14+EGFR  COVID-19 vaccination declined Assessment & Plan: Declines covid 19 vaccine. Discussed risk of covid 26 and if she changes her mind about the vaccine to call the office. Education has been provided regarding the importance of this vaccine but patient still declined. Advised may receive this vaccine at local pharmacy or Health  Dept.or vaccine clinic. Aware to provide a copy of the vaccination record if obtained from local pharmacy or Health Dept.  Encouraged to take multivitamin, vitamin d, vitamin c and zinc to increase immune system. Aware can call office if would like to have vaccine here at office. Verbalized acceptance and understanding.      Influenza vaccination declined Assessment & Plan: Patient declined influenza vaccination at this time. Patient is aware that influenza vaccine prevents illness in 70% of healthy people, and reduces hospitalizations to 30-70% in elderly. This vaccine is recommended annually. Education has been provided regarding the importance of this vaccine but patient still declined. Advised may receive this vaccine at local pharmacy or Health Dept.or vaccine clinic. Aware to provide a copy of the vaccination record if obtained from local pharmacy or Health Dept.  Pt is willing to accept risk associated with refusing vaccination.    Tetanus, diphtheria, and acellular pertussis (Tdap) vaccination declined  Herpes zoster vaccination declined Assessment & Plan: Declines shingrix, educated on disease process and is aware if he changes his mind to notify office    Prediabetes Assessment & Plan: HgbA1c is stable, continue focusing on healthy diet low in sugar and starches.   Orders: -     Hemoglobin A1c  Obesity, morbid, BMI 40.0-49.9 (HCC) Assessment & Plan: She is encouraged to strive for BMI less than 30 to decrease cardiac risk. Advised to aim for at least 150 minutes of exercise per week.    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 Patient was given opportunity to ask questions. Patient verbalized understanding of the plan and was able to repeat key elements of the plan. All questions were answered to their satisfaction.   Ruth Felts, FNP  I, Ruth Felts, FNP, have reviewed all documentation for this visit. The documentation on 08/25/23 for the exam, diagnosis, procedures, and orders are all accurate and complete.

## 2023-08-25 NOTE — Assessment & Plan Note (Signed)
Behavior modifications discussed and diet history reviewed.   Pt will continue to exercise regularly and modify diet with low GI, plant based foods and decrease intake of processed foods.  Recommend intake of daily multivitamin, Vitamin D, and calcium.  Recommend mammogram and she had a cologuard in 2023 for preventive screenings, as well as recommend immunizations that include influenza, TDAP, and Shingles

## 2023-08-25 NOTE — Assessment & Plan Note (Signed)
 She is encouraged to strive for BMI less than 30 to decrease cardiac risk. Advised to aim for at least 150 minutes of exercise per week.

## 2023-08-25 NOTE — Assessment & Plan Note (Signed)
Continue f/u with Oncology and getting your infusions/injection

## 2023-08-26 ENCOUNTER — Encounter: Payer: Self-pay | Admitting: Hematology and Oncology

## 2023-08-27 LAB — CBC WITH DIFFERENTIAL/PLATELET
Basophils Absolute: 0 10*3/uL (ref 0.0–0.2)
Basos: 1 %
EOS (ABSOLUTE): 0.2 10*3/uL (ref 0.0–0.4)
Eos: 2 %
Hematocrit: 35.3 % (ref 34.0–46.6)
Hemoglobin: 11.5 g/dL (ref 11.1–15.9)
Immature Grans (Abs): 0 10*3/uL (ref 0.0–0.1)
Immature Granulocytes: 0 %
Lymphocytes Absolute: 2 10*3/uL (ref 0.7–3.1)
Lymphs: 32 %
MCH: 30.5 pg (ref 26.6–33.0)
MCHC: 32.6 g/dL (ref 31.5–35.7)
MCV: 94 fL (ref 79–97)
Monocytes Absolute: 0.5 10*3/uL (ref 0.1–0.9)
Monocytes: 8 %
Neutrophils Absolute: 3.6 10*3/uL (ref 1.4–7.0)
Neutrophils: 57 %
Platelets: 338 10*3/uL (ref 150–450)
RBC: 3.77 x10E6/uL (ref 3.77–5.28)
RDW: 12.3 % (ref 11.7–15.4)
WBC: 6.3 10*3/uL (ref 3.4–10.8)

## 2023-08-27 LAB — HEMOGLOBIN A1C
Est. average glucose Bld gHb Est-mCnc: 123 mg/dL
Hgb A1c MFr Bld: 5.9 % — ABNORMAL HIGH (ref 4.8–5.6)

## 2023-08-27 LAB — CMP14+EGFR
ALT: 13 [IU]/L (ref 0–32)
AST: 15 [IU]/L (ref 0–40)
Albumin: 4.2 g/dL (ref 3.8–4.8)
Alkaline Phosphatase: 153 [IU]/L — ABNORMAL HIGH (ref 44–121)
BUN/Creatinine Ratio: 11 — ABNORMAL LOW (ref 12–28)
BUN: 15 mg/dL (ref 8–27)
Bilirubin Total: 0.7 mg/dL (ref 0.0–1.2)
CO2: 23 mmol/L (ref 20–29)
Calcium: 9.6 mg/dL (ref 8.7–10.3)
Chloride: 107 mmol/L — ABNORMAL HIGH (ref 96–106)
Creatinine, Ser: 1.34 mg/dL — ABNORMAL HIGH (ref 0.57–1.00)
Globulin, Total: 2.7 g/dL (ref 1.5–4.5)
Glucose: 76 mg/dL (ref 70–99)
Potassium: 4.6 mmol/L (ref 3.5–5.2)
Sodium: 143 mmol/L (ref 134–144)
Total Protein: 6.9 g/dL (ref 6.0–8.5)
eGFR: 42 mL/min/{1.73_m2} — ABNORMAL LOW (ref >59)

## 2023-08-27 LAB — MICROALBUMIN / CREATININE URINE RATIO
Creatinine, Urine: 150.9 mg/dL
Microalb/Creat Ratio: <2 mg/g{creat} (ref 0–29)
Microalbumin, Urine: <3 ug/mL

## 2023-08-27 LAB — LIPID PANEL
Chol/HDL Ratio: 2.5 {ratio} (ref 0.0–4.4)
Cholesterol, Total: 181 mg/dL (ref 100–199)
HDL: 71 mg/dL (ref >39)
LDL Chol Calc (NIH): 87 mg/dL (ref 0–99)
Triglycerides: 136 mg/dL (ref 0–149)
VLDL Cholesterol Cal: 23 mg/dL (ref 5–40)

## 2023-09-07 DIAGNOSIS — Z85038 Personal history of other malignant neoplasm of large intestine: Secondary | ICD-10-CM | POA: Insufficient documentation

## 2023-09-07 NOTE — Assessment & Plan Note (Signed)

## 2023-09-07 NOTE — Assessment & Plan Note (Signed)
HgbA1c is stable, continue focusing on healthy diet low in sugar and starches

## 2023-09-07 NOTE — Assessment & Plan Note (Signed)
Seen in her old notes from Oncology

## 2023-09-07 NOTE — Assessment & Plan Note (Signed)

## 2023-09-07 NOTE — Assessment & Plan Note (Signed)
Declines shingrix, educated on disease process and is aware if he changes his mind to notify office  

## 2023-09-10 ENCOUNTER — Inpatient Hospital Stay: Payer: PPO

## 2023-09-17 ENCOUNTER — Inpatient Hospital Stay: Payer: PPO

## 2023-09-18 ENCOUNTER — Inpatient Hospital Stay: Payer: PPO | Attending: Hematology and Oncology

## 2023-09-18 VITALS — BP 129/89 | HR 106 | Temp 98.7°F

## 2023-09-18 DIAGNOSIS — C50812 Malignant neoplasm of overlapping sites of left female breast: Secondary | ICD-10-CM | POA: Diagnosis not present

## 2023-09-18 DIAGNOSIS — Z79899 Other long term (current) drug therapy: Secondary | ICD-10-CM | POA: Insufficient documentation

## 2023-09-18 DIAGNOSIS — Z171 Estrogen receptor negative status [ER-]: Secondary | ICD-10-CM

## 2023-09-18 DIAGNOSIS — C50919 Malignant neoplasm of unspecified site of unspecified female breast: Secondary | ICD-10-CM

## 2023-09-18 DIAGNOSIS — Z5111 Encounter for antineoplastic chemotherapy: Secondary | ICD-10-CM | POA: Insufficient documentation

## 2023-09-18 DIAGNOSIS — Z95828 Presence of other vascular implants and grafts: Secondary | ICD-10-CM

## 2023-09-18 MED ORDER — FULVESTRANT 250 MG/5ML IM SOSY
500.0000 mg | PREFILLED_SYRINGE | Freq: Once | INTRAMUSCULAR | Status: AC
  Administered 2023-09-18: 500 mg via INTRAMUSCULAR
  Filled 2023-09-18: qty 10

## 2023-09-22 ENCOUNTER — Other Ambulatory Visit: Payer: Self-pay

## 2023-10-08 ENCOUNTER — Inpatient Hospital Stay: Payer: PPO

## 2023-10-08 ENCOUNTER — Inpatient Hospital Stay: Payer: PPO | Admitting: Hematology and Oncology

## 2023-10-15 ENCOUNTER — Inpatient Hospital Stay: Payer: PPO

## 2023-10-15 ENCOUNTER — Inpatient Hospital Stay: Payer: PPO | Admitting: Hematology and Oncology

## 2023-10-15 ENCOUNTER — Other Ambulatory Visit: Payer: Self-pay | Admitting: *Deleted

## 2023-10-15 DIAGNOSIS — Z171 Estrogen receptor negative status [ER-]: Secondary | ICD-10-CM

## 2023-10-16 ENCOUNTER — Inpatient Hospital Stay: Payer: PPO | Admitting: Hematology and Oncology

## 2023-10-16 ENCOUNTER — Encounter: Payer: Self-pay | Admitting: Hematology and Oncology

## 2023-10-16 ENCOUNTER — Inpatient Hospital Stay: Payer: PPO

## 2023-10-16 VITALS — BP 123/73 | HR 94 | Temp 97.6°F | Resp 18 | Wt 237.6 lb

## 2023-10-16 DIAGNOSIS — C50919 Malignant neoplasm of unspecified site of unspecified female breast: Secondary | ICD-10-CM

## 2023-10-16 DIAGNOSIS — Z95828 Presence of other vascular implants and grafts: Secondary | ICD-10-CM

## 2023-10-16 DIAGNOSIS — Z1589 Genetic susceptibility to other disease: Secondary | ICD-10-CM

## 2023-10-16 DIAGNOSIS — C50412 Malignant neoplasm of upper-outer quadrant of left female breast: Secondary | ICD-10-CM

## 2023-10-16 DIAGNOSIS — Z1509 Genetic susceptibility to other malignant neoplasm: Secondary | ICD-10-CM | POA: Diagnosis not present

## 2023-10-16 DIAGNOSIS — Z171 Estrogen receptor negative status [ER-]: Secondary | ICD-10-CM | POA: Diagnosis not present

## 2023-10-16 DIAGNOSIS — Z1502 Genetic susceptibility to malignant neoplasm of ovary: Secondary | ICD-10-CM | POA: Diagnosis not present

## 2023-10-16 DIAGNOSIS — Z5111 Encounter for antineoplastic chemotherapy: Secondary | ICD-10-CM | POA: Diagnosis not present

## 2023-10-16 LAB — CMP (CANCER CENTER ONLY)
ALT: 15 U/L (ref 0–44)
AST: 13 U/L — ABNORMAL LOW (ref 15–41)
Albumin: 4 g/dL (ref 3.5–5.0)
Alkaline Phosphatase: 134 U/L — ABNORMAL HIGH (ref 38–126)
Anion gap: 7 (ref 5–15)
BUN: 18 mg/dL (ref 8–23)
CO2: 24 mmol/L (ref 22–32)
Calcium: 9.6 mg/dL (ref 8.9–10.3)
Chloride: 110 mmol/L (ref 98–111)
Creatinine: 1.03 mg/dL — ABNORMAL HIGH (ref 0.44–1.00)
GFR, Estimated: 58 mL/min — ABNORMAL LOW (ref >60)
Glucose, Bld: 92 mg/dL (ref 70–99)
Potassium: 3.6 mmol/L (ref 3.5–5.1)
Sodium: 141 mmol/L (ref 135–145)
Total Bilirubin: 0.8 mg/dL (ref 0.0–1.2)
Total Protein: 7.3 g/dL (ref 6.5–8.1)

## 2023-10-16 LAB — CBC WITH DIFFERENTIAL (CANCER CENTER ONLY)
Abs Immature Granulocytes: 0 10*3/uL (ref 0.00–0.07)
Basophils Absolute: 0 10*3/uL (ref 0.0–0.1)
Basophils Relative: 0 %
Eosinophils Absolute: 0.1 10*3/uL (ref 0.0–0.5)
Eosinophils Relative: 3 %
HCT: 34.8 % — ABNORMAL LOW (ref 36.0–46.0)
Hemoglobin: 11.3 g/dL — ABNORMAL LOW (ref 12.0–15.0)
Immature Granulocytes: 0 %
Lymphocytes Relative: 35 %
Lymphs Abs: 1.8 10*3/uL (ref 0.7–4.0)
MCH: 30.1 pg (ref 26.0–34.0)
MCHC: 32.5 g/dL (ref 30.0–36.0)
MCV: 92.6 fL (ref 80.0–100.0)
Monocytes Absolute: 0.4 10*3/uL (ref 0.1–1.0)
Monocytes Relative: 8 %
Neutro Abs: 2.8 10*3/uL (ref 1.7–7.7)
Neutrophils Relative %: 54 %
Platelet Count: 293 10*3/uL (ref 150–400)
RBC: 3.76 MIL/uL — ABNORMAL LOW (ref 3.87–5.11)
RDW: 11.9 % (ref 11.5–15.5)
WBC Count: 5.1 10*3/uL (ref 4.0–10.5)
nRBC: 0 % (ref 0.0–0.2)

## 2023-10-16 MED ORDER — GABAPENTIN 300 MG PO CAPS: 300.0000 mg | ORAL_CAPSULE | Freq: Every day | ORAL | 4 refills | Status: DC

## 2023-10-16 MED ORDER — FULVESTRANT 250 MG/5ML IM SOSY
500.0000 mg | PREFILLED_SYRINGE | Freq: Once | INTRAMUSCULAR | Status: AC
  Administered 2023-10-16: 500 mg via INTRAMUSCULAR

## 2023-10-16 NOTE — Progress Notes (Signed)
[facility]  Telephone:([phone] [phone]) [phone]    ID: [name] DOB: [birth year]  MR#: [id number]  DGU#:[id number]  Patient Care Team: [name], FNP as PCP - General (General Practice) Emelia Loron, MD as Consulting Physician (General Surgery) Antony Blackbird, MD as Consulting Physician (Radiation Oncology) Chalmers Guest, MD as Consulting Physician (Ophthalmology) Rachel Moulds, MD as Consulting Physician (Hematology and Oncology)   CHIEF COMPLAINT: triple negative breast cancer; PALB2 positive (s/p left mastectomy)  CURRENT TREATMENT: fulvestrant  INTERVAL HISTORY:  [name] returns today for follow up and treatment of her recurrent left breast cancer. She is currently on adjuvant Faslodex.  She is doing really well on Faslodex except for the malodor in her urine for the first couple days after the injection.  Otherwise she is doing well with no adverse effects.  She had her last mammogram in June 2024.  She denies any breast changes.  She is not able to walk much because of the bad knee.  She otherwise denies any change in breathing, bowel habits or urinary habits Rest of the pertinent 10 point ROS reviewed and negative   COVID 19 VACCINATION STATUS: fully vaccinated [name]), with booster 11/2020   BREAST CANCER HISTORY: From the original intake note:  [name] herself palpated a mass in her left breast, and immediately brought it to the attention of her primary care physician, Dr. Allyne Gee, who confirmed the finding and setup the patient for bilateral diagnostic mammography at the breast Center 02/20/2014. This showed a high density mass in the outer left breast measuring up to 3.7 cm. It corresponded to the site of palpable concern. In addition the normal 4 logically abnormal lymph nodes in the left axilla. The mass was palpable by exam, and by ultrasonography measured 2.6 cm. There were enlarged and morphologically abnormal lymph nodes in the  left axilla, largest measuring 3 cm.  Biopsy of the left breast mass in question and 1 of the abnormal axillary lymph nodes 02/22/2014 showed ([id number]) biopsies to be positive for an invasive ductal carcinoma, grade 3, triple negative, with an MIB-1 of 90%. Specifically the HER-2 signals ratio was 1.05, and the number per cell 2.00.  MRI of the breasts 03/01/2014 showed a 3.8 cm enhancing mass with areas of apparent necrosis in the left breast, as well as the biopsy tract extending laterally, the combined measure 5.3 cm. There were no other masses or areas of abnormal enhancement. There were multiple abnormally enlarged left axillary lymph nodes with loss of the normal fatty hilum. These included level I and retropectoral lymph nodes.  The patient's subsequent history is as detailed below   PAST MEDICAL HISTORY: Past Medical History:  Diagnosis Date   Colon cancer (HCC)    Hx of rotator cuff surgery    Hypertension    left breast cancer    Personal history of chemotherapy 2016   left   Personal history of radiation therapy 2016   left breast    Radiation 11/15/14-01/02/15   left breast, axillary and supraclavicular region 45 gray, lumpectomy cavity boosted to 16 gray    PAST SURGICAL HISTORY: Past Surgical History:  Procedure Laterality Date   ABDOMINAL HYSTERECTOMY  1988   BREAST EXCISIONAL BIOPSY Right 12/21/2017   BREAST LUMPECTOMY Left 2016   COLON SURGERY  1988   colectomy/colostomy-ca   COLON SURGERY  1988   colostomy takedown-reversal   COLONOSCOPY     EXCISION / BIOPSY BREAST / NIPPLE / DUCT Left 09/20/2014   IR  IMAGING GUIDED PORT INSERTION  03/22/2020   MASTECTOMY Left 02/14/2020   MASTECTOMY W/ SENTINEL NODE BIOPSY Left 02/14/2020   Procedure: LEFT MASTECTOMY WITH BLUE DYE INJECTION;  Surgeon: Emelia Loron, MD;  Location: [facility];  Service: General;  Laterality: Left;  PEC BLOCK   RADIOACTIVE SEED GUIDED EXCISIONAL BREAST BIOPSY Right 12/21/2017   Procedure: RIGHT  RADIOACTIVE SEED GUIDED EXCISIONAL BREAST BIOPSY ERAS PATHWAY;  Surgeon: Emelia Loron, MD;  Location: [facility];  Service: General;  Laterality: Right;   RADIOACTIVE SEED GUIDED PARTIAL MASTECTOMY WITH AXILLARY SENTINEL LYMPH NODE BIOPSY Left 09/20/2014   Procedure: RADIOACTIVE SEED GUIDED LEFT BREAST LUMPECTOMY WITH AXILLARY SENTINEL LYMPH NODE BIOPSY;  Surgeon: Emelia Loron, MD;  Location: [facility];  Service: General;  Laterality: Left;   TOTAL SHOULDER ARTHROPLASTY  2009   right    FAMILY HISTORY Family History  Problem Relation Age of Onset   Cancer Mother 31       breast   Breast cancer Mother        early 14s   Stroke Father 16       cause of death   Diabetes Father    Cancer Maternal Aunt        breast cancer at unknown age   Breast cancer Maternal Aunt    Ovarian cancer Neg Hx    the patient's father died at the age of 26 following a stroke in the setting of diabetes; the patient's mother is living at 75. The patient has 3 brothers and 2 sisters. [name], Epimenio Sarin, was diagnosed with breast cancer in her early 32s. There is no other breast or ovarian cancer in the family. The patient herself was diagnosed with watts by history he is an incidentally found stage I colon carcinoma noted at the time of her hysterectomy. She had a partial colectomy but no adjuvant treatment. We have no records regarding that procedure   GYNECOLOGIC HISTORY:  No LMP recorded. Patient has had a hysterectomy. Menarche age 35, first live birth age 61, the patient underwent total abdominal hysterectomy with bilateral salpingo-oophorectomy in her 74s. She did not take hormone replacement. She did not take oral contraceptives at any point.   SOCIAL HISTORY: (Updated May 2021) Jay worked for [employer] for about 40 years, retiring in 2008. She is divorced and lives alone, with no pets.  Her grandson who is a Sports administrator frequently stays  with her.  Her daughter Annebelle Bostic works for [employer] in Audiological scientist. The second daughter, Veroncia Jezek, works as a Conservation officer, nature at [employer].  The patient has 6 grandchildren and three great-grandchildren. She attends a [employer].     ADVANCED DIRECTIVES: Not in place. The patient intends to name her daughter Junie Spencer. her healthcare power of attorney. Verlon Au is work number is [phone]. On the patient's 03/01/2014 visit she was given a copy of the appropriate documents to complete and notarize at her discretion.     HEALTH MAINTENANCE: Social History   Tobacco Use   Smoking status: Never   Smokeless tobacco: Never  Vaping Use   Vaping status: Never Used  Substance Use Topics   Alcohol use: Not Currently    Comment: occ-rare   Drug use: No     Colonoscopy: 2000  PAP: May 2015  Bone density: 12/21/2017, T-score of -0.6  Lipid panel: 02/03/2014  No Known Allergies  Current Outpatient Medications  Medication Sig Dispense Refill   acetaminophen (TYLENOL) 500 MG  tablet Take 1,000 mg by mouth every 6 (six) hours as needed for mild pain or moderate pain.     gabapentin (NEURONTIN) 300 MG capsule Take 1 capsule (300 mg total) by mouth at bedtime. 90 capsule 4   hydroxypropyl methylcellulose / hypromellose (ISOPTO TEARS / GONIOVISC) 2.5 % ophthalmic solution Place 1 drop into both eyes daily as needed for dry eyes.     Liniments (SALONPAS ARTHRITIS PAIN RELIEF EX) Apply 1 application topically daily as needed (knee pain).     lisinopril-hydrochlorothiazide (ZESTORETIC) 20-12.5 MG tablet TAKE 1/2 TABLET BY MOUTH DAILY 45 tablet 0   LUMIGAN 0.01 % SOLN      Multiple Vitamins-Minerals (AIRBORNE PO) Take 1 tablet by mouth 2 (two) times a week.     ursodiol (ACTIGALL) 500 MG tablet Take 500 mg by mouth 3 (three) times daily.      No current facility-administered medications for this visit.    OBJECTIVE: African-American woman who appears older than stated age There were no  vitals filed for this visit.   There were no vitals filed for this visit.   There is no height or weight on file to calculate BMI.     ECOG FS:1 - Symptomatic but completely ambulatory   Physical Exam Constitutional:      Appearance: Normal appearance.  Cardiovascular:     Rate and Rhythm: Normal rate and regular rhythm.     Pulses: Normal pulses.     Heart sounds: Normal heart sounds.  Chest:     Comments: Bilateral breasts inspected.  Left breast status postmastectomy.  Right breast with no palpable masses or adenopathy.  Abdominal:     General: There is no distension.     Tenderness: There is no abdominal tenderness.  Musculoskeletal:     Cervical back: Normal range of motion and neck supple. No rigidity.  Lymphadenopathy:     Cervical: No cervical adenopathy.  Skin:    General: Skin is warm and dry.  Neurological:     General: No focal deficit present.     Mental Status: She is alert.  Psychiatric:        Mood and Affect: Mood normal.    LAB RESULTS:  No results found for: "SPEP", "UPEP"  Lab Results  Component Value Date   WBC 5.1 10/16/2023   NEUTROABS 2.8 10/16/2023   HGB 11.3 (L) 10/16/2023   HCT 34.8 (L) 10/16/2023   MCV 92.6 10/16/2023   PLT 293 10/16/2023      Chemistry      Component Value Date/Time   NA 143 08/25/2023 1228   NA 142 11/13/2016 1050   K 4.6 08/25/2023 1228   K 4.1 11/13/2016 1050   CL 107 (H) 08/25/2023 1228   CO2 23 08/25/2023 1228   CO2 27 11/13/2016 1050   BUN 15 08/25/2023 1228   BUN 11.5 11/13/2016 1050   CREATININE 1.34 (H) 08/25/2023 1228   CREATININE 0.90 03/26/2023 1319   CREATININE 1.0 11/13/2016 1050      Component Value Date/Time   CALCIUM 9.6 08/25/2023 1228   CALCIUM 9.8 11/13/2016 1050   ALKPHOS 153 (H) 08/25/2023 1228   ALKPHOS 140 11/13/2016 1050   AST 15 08/25/2023 1228   AST 12 (L) 03/26/2023 1319   AST 13 11/13/2016 1050   ALT 13 08/25/2023 1228   ALT 12 03/26/2023 1319   ALT 15 11/13/2016 1050    BILITOT 0.7 08/25/2023 1228   BILITOT 1.0 03/26/2023 1319   BILITOT 0.86 11/13/2016  1050       No results found for: "LABCA2"  No components found for: "LABCA125"  No results for input(s): "INR" in the last 168 hours.  Urinalysis    Component Value Date/Time   COLORURINE YELLOW 04/05/2008 1120   APPEARANCEUR CLEAR 04/05/2008 1120   LABSPEC 1.021 04/05/2008 1120   PHURINE 6.0 04/05/2008 1120   GLUCOSEU NEGATIVE 04/05/2008 1120   HGBUR NEGATIVE 04/05/2008 1120   BILIRUBINUR negative 08/25/2023 1433   BILIRUBINUR negative 12/12/2020 1236   KETONESUR negative 08/25/2023 1433   KETONESUR NEGATIVE 04/05/2008 1120   PROTEINUR Negative 12/12/2020 1236   PROTEINUR NEGATIVE 04/05/2008 1120   UROBILINOGEN 0.2 08/25/2023 1433   UROBILINOGEN 0.2 04/05/2008 1120   NITRITE Negative 08/25/2023 1433   NITRITE negative 12/12/2020 1236   NITRITE NEGATIVE 04/05/2008 1120   LEUKOCYTESUR Negative 08/25/2023 1433    STUDIES: No results found.     ASSESSMENT: 73 y.o. BRCA negative [place] woman s/p left breast upper outer quadrant and left axillary lymph node biopsy 02/22/2014, both positive for a clinical T2 N2, stage IIIA invasive ductal carcinoma, grade 3, triple negative, with an MIB-1 of 90%  (1) neoadjuvant chemotherapy started 03/13/2014, with cyclophosphamide and doxorubicin in dose dense fashion x4, with Neulasta support on day 2, completed 04/24/2014, followed by weekly carboplatin and paclitaxel x12, paclitaxel reduced during cycle 5 and cycle 7 because of elevated bilirubin levels  (2) left lumpectomy and sentinel lymph node sampling 09/20/2014 showed a complete pathologic response.  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, showed no deleterious mutations.  (b) PALB2 mutations are known to increase the risk of breast and pancreatic cancer, possibly other cancers, but the data is preliminary  (c) the patient's 2 daughters have been tested and did not carry the gene  (d) Alayzia's MSH2 VUS has been reclassified as "likely benign"  (e) she is status post remote TAH/BSO  (f) intensified screening, with mammography in February and breast MRI in August planned  (6) staging studies showed right-sided lung nodules, too small to characterize, and a dominant right-sided thyroid nodule unchanged in size as compared to 2011; to be followed  (a) chest CT 04/24/2016 showed the small pulmonary nodules to be completely stable, consistent with benign findings.  (b) thyroid ultrasound--done on 07/22/2018 showed enlarged heterogeneous lobular and multinodular thyroid gland, TI-RADS category 3 nodules, not meeting criteria for further biopsy or dedicated imaging f/u.    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  (8) anastrozole started 07/08/2018 for breast cancer prevention, discontinued 01/30/2020 with disease recurrence  (a) bone density scan 01/01/2018 normal, T score -0.6  (9) RECURRENT DISEASE May 2021  (a) left breast overlapping biopsy 01/20/2020 shows a clinical  T2 N0, stage IIB invasive ductal carcinoma, grade 2 or 3, with moderate estrogen receptor positivity, progesterone receptor negative, HER-2 not amplified, MIB-1 of 80%  (b) left mastectomy on 02/14/2020 showed a pT2 pNX, invasive ductal carcinoma, grade 3, with negative margins   (c) CT of the chest and bone scan on 02/08/2020 shows no distant metastases  (10) started fulvestrant and palbociclib 02/24/2020 discontinued on 03/08/2020  (a) Oncotype sent on surgical sample shows score  of 60 indicating a greater than 29% risk of  recurrence on antiestrogens alone, and also predicting a significant benefit from chemotherapy.  (b) the Oncotype test found this tumor to be triple negative by single gene scores.   (11) cyclophosphamide, methotrexate, and fluorouracil [CMF] every 28 days x 8 cycles given from 03/27/2020-08/22/2020.  (12) fulvestrant resumed 10/03/2020   PLAN:  Ms. Maribella is now here for for follow-up on adjuvant Faslodex.  She started Faslodex in January 2022.  She is tolerating this very well.  Anticipate completion of 5 years of antiestrogen therapy in January 2027. She is now 3 years out from her recurrent breast cancer.  No review of systems or physical exam findings to be concerned about metastatic breast cancer.  Her last mammogram was unremarkable. Screening mammogram ordered for June 2025. Return to clinic in 6 months for further evaluation or sooner as needed. Once again encouraged activity, vitamin D supplementation.  Total time spent: 15 minutes  *Total Encounter Time as defined by the Centers for Medicare and Medicaid Services includes, in addition to the face-to-face time of a patient visit (documented in the note above) non-face-to-face time: obtaining and reviewing outside history, ordering and reviewing medications, tests or procedures, care coordination (communications with other health care professionals or caregivers) and documentation in the medical record.

## 2023-11-18 ENCOUNTER — Encounter: Payer: Self-pay | Admitting: Genetic Counselor

## 2023-11-19 ENCOUNTER — Telehealth: Payer: Self-pay | Admitting: Hematology and Oncology

## 2023-11-19 NOTE — Telephone Encounter (Signed)
 Scheduled appointments per patients request via incoming call. Patient is aware of all made appointments.

## 2023-11-20 ENCOUNTER — Inpatient Hospital Stay: Attending: Hematology and Oncology

## 2023-11-20 VITALS — BP 125/66 | HR 90 | Temp 97.7°F | Resp 18

## 2023-11-20 DIAGNOSIS — Z5111 Encounter for antineoplastic chemotherapy: Secondary | ICD-10-CM | POA: Diagnosis not present

## 2023-11-20 DIAGNOSIS — Z79899 Other long term (current) drug therapy: Secondary | ICD-10-CM | POA: Diagnosis not present

## 2023-11-20 DIAGNOSIS — Z171 Estrogen receptor negative status [ER-]: Secondary | ICD-10-CM | POA: Diagnosis not present

## 2023-11-20 DIAGNOSIS — C50412 Malignant neoplasm of upper-outer quadrant of left female breast: Secondary | ICD-10-CM

## 2023-11-20 DIAGNOSIS — C50812 Malignant neoplasm of overlapping sites of left female breast: Secondary | ICD-10-CM | POA: Diagnosis not present

## 2023-11-20 DIAGNOSIS — C50919 Malignant neoplasm of unspecified site of unspecified female breast: Secondary | ICD-10-CM

## 2023-11-20 DIAGNOSIS — Z95828 Presence of other vascular implants and grafts: Secondary | ICD-10-CM

## 2023-11-20 MED ORDER — FULVESTRANT 250 MG/5ML IM SOSY
500.0000 mg | PREFILLED_SYRINGE | Freq: Once | INTRAMUSCULAR | Status: AC
  Administered 2023-11-20: 500 mg via INTRAMUSCULAR
  Filled 2023-11-20: qty 10

## 2023-11-24 ENCOUNTER — Encounter: Payer: Self-pay | Admitting: Nurse Practitioner

## 2023-11-24 ENCOUNTER — Ambulatory Visit (INDEPENDENT_AMBULATORY_CARE_PROVIDER_SITE_OTHER): Admitting: Nurse Practitioner

## 2023-11-24 ENCOUNTER — Other Ambulatory Visit: Payer: Self-pay | Admitting: Nurse Practitioner

## 2023-11-24 ENCOUNTER — Other Ambulatory Visit

## 2023-11-24 VITALS — BP 120/80 | HR 76 | Temp 98.6°F | Wt 241.4 lb

## 2023-11-24 DIAGNOSIS — M25561 Pain in right knee: Secondary | ICD-10-CM | POA: Diagnosis not present

## 2023-11-24 DIAGNOSIS — R748 Abnormal levels of other serum enzymes: Secondary | ICD-10-CM

## 2023-11-24 DIAGNOSIS — Z2821 Immunization not carried out because of patient refusal: Secondary | ICD-10-CM | POA: Diagnosis not present

## 2023-11-24 DIAGNOSIS — R7989 Other specified abnormal findings of blood chemistry: Secondary | ICD-10-CM | POA: Diagnosis not present

## 2023-11-24 DIAGNOSIS — Z6841 Body Mass Index (BMI) 40.0 and over, adult: Secondary | ICD-10-CM

## 2023-11-24 DIAGNOSIS — G8929 Other chronic pain: Secondary | ICD-10-CM | POA: Insufficient documentation

## 2023-11-24 MED ORDER — DICLOFENAC SODIUM 1 % EX GEL: 2.0000 g | Freq: Four times a day (QID) | CUTANEOUS | 2 refills | Status: AC

## 2023-11-24 NOTE — Assessment & Plan Note (Signed)
 Declines shingrix, educated on disease process and is aware if he changes his mind to notify office

## 2023-11-24 NOTE — Progress Notes (Signed)
 [name], CMA,acting as a Neurosurgeon for [name], FNP.,have documented all relevant documentation on the behalf of [name], FNP,as directed by  [name], FNP while in the presence of [name], FNP.  Subjective:  [name] , female    DOB: 11/26/1950 , 73 y.o.   MRN: [id number]  No chief complaint on file.   HPI  Patient presents today for knee pain in her right knee. Patient reports it has been hurting for a while but seems to be getting worse. She is taking tylenol to ease the pain but is not everyday. She will occasionally take an aleve. When she gets up she is stiff. The pain radiates to the back of her right knee. She has not seen an orthopedic in the past.     Past Medical History:  Diagnosis Date   Colon cancer (HCC)    Hx of rotator cuff surgery    Hypertension    left breast cancer    Personal history of chemotherapy 2016   left   Personal history of radiation therapy 2016   left breast    Radiation 11/15/14-01/02/15   left breast, axillary and supraclavicular region 45 gray, lumpectomy cavity boosted to 16 gray     Family History  Problem Relation Age of Onset   Cancer Mother 63       breast   Breast cancer Mother        early 32s   Stroke Father 28       cause of death   Diabetes Father    Cancer Maternal Aunt        breast cancer at unknown age   Breast cancer Maternal Aunt    Ovarian cancer Neg Hx      Current Outpatient Medications:    acetaminophen (TYLENOL) 500 MG tablet, Take 1,000 mg by mouth every 6 (six) hours as needed for mild pain or moderate pain., Disp: , Rfl:    diclofenac Sodium (VOLTAREN) 1 % GEL, Apply 2 g topically 4 (four) times daily., Disp: 100 g, Rfl: 2   gabapentin (NEURONTIN) 300 MG capsule, Take 1 capsule (300 mg total) by mouth at bedtime., Disp: 90 capsule, Rfl: 4   hydroxypropyl methylcellulose / hypromellose (ISOPTO TEARS / GONIOVISC) 2.5 % ophthalmic solution, Place 1 drop into both eyes  daily as needed for dry eyes., Disp: , Rfl:    Liniments (SALONPAS ARTHRITIS PAIN RELIEF EX), Apply 1 application topically daily as needed (knee pain)., Disp: , Rfl:    lisinopril-hydrochlorothiazide (ZESTORETIC) 20-12.5 MG tablet, TAKE 1/2 TABLET BY MOUTH DAILY, Disp: 45 tablet, Rfl: 0   LUMIGAN 0.01 % SOLN, , Disp: , Rfl:    Multiple Vitamins-Minerals (AIRBORNE PO), Take 1 tablet by mouth 2 (two) times a week., Disp: , Rfl:    ursodiol (ACTIGALL) 500 MG tablet, Take 500 mg by mouth 3 (three) times daily. , Disp: , Rfl:    No Known Allergies   Review of Systems  Constitutional: Negative.   Respiratory: Negative.    Cardiovascular: Negative.  Negative for chest pain and palpitations.  Gastrointestinal: Negative.   Musculoskeletal:        Right knee pain and she is now using a cane to ambulate for about 2 years.   Neurological: Negative.  Negative for headaches.     Today's Vitals   11/24/23 0941  BP: 120/80  Pulse: 76  Temp: 98.6 F (37 C)  TempSrc: Oral  Weight: 241 lb 6.4 oz (  109.5 kg)  PainSc: 7   PainLoc: Knee   Body mass index is 45.61 kg/m.  Wt Readings from Last 3 Encounters:  11/24/23 241 lb 6.4 oz (109.5 kg)  10/16/23 237 lb 9.6 oz (107.8 kg)  08/25/23 239 lb 3.2 oz (108.5 kg)    The 10-year ASCVD risk score (Arnett DK, et al., 2019) is: 11.5%   Values used to calculate the score:     Age: 46 years     Sex: Female     Is Non-Hispanic African American: Yes     Diabetic: No     Tobacco smoker: No     Systolic Blood Pressure: 120 mmHg     Is BP treated: Yes     HDL Cholesterol: 71 mg/dL     Total Cholesterol: 181 mg/dL  Objective:  Physical Exam Vitals reviewed.  Constitutional:      General: She is not in acute distress.    Appearance: Normal appearance. She is obese.  Eyes:     Pupils: Pupils are equal, round, and reactive to light.  Cardiovascular:     Rate and Rhythm: Normal rate and regular rhythm.     Pulses: Normal pulses.     Heart sounds:  Normal heart sounds. No murmur heard. Pulmonary:     Effort: Pulmonary effort is normal. No respiratory distress.     Breath sounds: Normal breath sounds. No wheezing.  Musculoskeletal:        General: Tenderness (right knee pain) present. No swelling, deformity or signs of injury.     Right knee: No swelling. Decreased range of motion. Tenderness present.     Left knee: No swelling. Normal range of motion. No tenderness.     Comments: Posterior calf muscles are tense.   Skin:    General: Skin is warm and dry.     Capillary Refill: Capillary refill takes less than 2 seconds.  Neurological:     General: No focal deficit present.     Mental Status: She is alert and oriented to person, place, and time.     Cranial Nerves: No cranial nerve deficit.     Motor: No weakness.  Psychiatric:        Mood and Affect: Mood normal.        Behavior: Behavior normal.        Thought Content: Thought content normal.        Judgment: Judgment normal.         Assessment And Plan:  Chronic pain of right knee Assessment & Plan: Pain is worsening and causing her to have more stiffness and difficulty with ADLs. Will send Rx for diclofenac gel as needed   Orders: -     Ambulatory referral to Orthopedic Surgery  Herpes zoster vaccination declined Assessment & Plan: Declines shingrix, educated on disease process and is aware if he changes his mind to notify office    Obesity, morbid, BMI 40.0-49.9 (HCC) Assessment & Plan: She is encouraged to strive for BMI less than 30 to decrease cardiac risk. Advised to aim for at least 150 minutes of exercise per week.    Other orders -     Diclofenac Sodium; Apply 2 g topically 4 (four) times daily.  Dispense: 100 g; Refill: 2    Return for keep same next.  Patient was given opportunity to ask questions. Patient verbalized understanding of the plan and was able to repeat key elements of the plan. All questions were answered to their satisfaction.  Jeanell Sparrow, FNP, have reviewed all documentation for this visit. The documentation on 11/24/23 for the exam, diagnosis, procedures, and orders are all accurate and complete.   IF YOU HAVE BEEN REFERRED TO A SPECIALIST, IT MAY TAKE 1-2 WEEKS TO SCHEDULE/PROCESS THE REFERRAL. IF YOU HAVE NOT HEARD FROM US/SPECIALIST IN TWO WEEKS, PLEASE GIVE [place] A CALL AT [phone] X [phone].

## 2023-11-24 NOTE — Assessment & Plan Note (Signed)
 Pain is worsening and causing her to have more stiffness and difficulty with ADLs. Will send Rx for diclofenac gel as needed

## 2023-11-24 NOTE — Assessment & Plan Note (Signed)
 She is encouraged to strive for BMI less than 30 to decrease cardiac risk. Advised to aim for at least 150 minutes of exercise per week.

## 2023-11-28 LAB — CMP14+EGFR
ALT: 10 IU/L (ref 0–32)
AST: 11 IU/L (ref 0–40)
Albumin: 4.1 g/dL (ref 3.8–4.8)
Alkaline Phosphatase: 144 IU/L — ABNORMAL HIGH (ref 44–121)
BUN/Creatinine Ratio: 9 — ABNORMAL LOW (ref 12–28)
BUN: 9 mg/dL (ref 8–27)
Bilirubin Total: 1 mg/dL (ref 0.0–1.2)
CO2: 22 mmol/L (ref 20–29)
Calcium: 9.8 mg/dL (ref 8.7–10.3)
Chloride: 102 mmol/L (ref 96–106)
Creatinine, Ser: 0.96 mg/dL (ref 0.57–1.00)
Globulin, Total: 2.8 g/dL (ref 1.5–4.5)
Glucose: 87 mg/dL (ref 70–99)
Potassium: 4.3 mmol/L (ref 3.5–5.2)
Sodium: 141 mmol/L (ref 134–144)
Total Protein: 6.9 g/dL (ref 6.0–8.5)
eGFR: 63 mL/min/{1.73_m2} (ref >59)

## 2023-11-28 LAB — ALKALINE PHOSPHATASE, ISOENZYMES
BONE FRACTION: 35 % (ref 14–68)
INTESTINAL FRAC.: 10 % (ref 0–18)
LIVER FRACTION: 55 % (ref 18–85)

## 2023-11-30 ENCOUNTER — Encounter: Payer: Self-pay | Admitting: Nurse Practitioner

## 2023-12-02 ENCOUNTER — Other Ambulatory Visit (INDEPENDENT_AMBULATORY_CARE_PROVIDER_SITE_OTHER): Payer: Self-pay

## 2023-12-02 ENCOUNTER — Ambulatory Visit: Admitting: Orthopaedic Surgery

## 2023-12-02 VITALS — Ht 62.0 in | Wt 239.0 lb

## 2023-12-02 DIAGNOSIS — G8929 Other chronic pain: Secondary | ICD-10-CM

## 2023-12-02 DIAGNOSIS — Z6841 Body Mass Index (BMI) 40.0 and over, adult: Secondary | ICD-10-CM | POA: Diagnosis not present

## 2023-12-02 DIAGNOSIS — M25561 Pain in right knee: Secondary | ICD-10-CM

## 2023-12-02 MED ORDER — METHYLPREDNISOLONE ACETATE 40 MG/ML IJ SUSP
40.0000 mg | INTRAMUSCULAR | Status: AC | PRN
Start: 2023-12-02 — End: 2023-12-02
  Administered 2023-12-02: 40 mg via INTRA_ARTICULAR

## 2023-12-02 MED ORDER — LIDOCAINE HCL 1 % IJ SOLN
2.0000 mL | INTRAMUSCULAR | Status: AC | PRN
Start: 2023-12-02 — End: 2023-12-02
  Administered 2023-12-02: 2 mL

## 2023-12-02 NOTE — Progress Notes (Addendum)
 Office Visit Note   Patient: [name]           Date of Birth: [birth year]           MRN: [id number] Visit Date: 12/02/2023              Requested by: [name], FNP [address] [place],  [place] [address] PCP: [name], FNP   Assessment & Plan: Visit Diagnoses:  1. Chronic pain of right knee   2. 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  Did discuss with patient that at this time, can go ahead with steroid injection for symptom relief.  Patient does have a BMI of 43.  Did discuss with patient that long-term relief would likely require a knee replacement and that her BMI would need to be lower.  Patient was understanding and agreeable.  Will go ahead with the injection today, patient will work on weight loss and follow-up when ready for possible replacement.  Follow-Up Instructions: No follow-ups on file.   Orders:  Orders Placed This Encounter  Procedures   Large Joint Inj   XR KNEE 3 VIEW RIGHT   Meds ordered this encounter  Medications   lidocaine (XYLOCAINE) 1 % (with pres) injection 2 mL   methylPREDNISolone acetate (DEPO-MEDROL) injection 40 mg      Procedures: Large Joint Inj on 12/02/2023 10:37 AM Indications: pain Details: 22 G 1.5 in needle, superolateral approach Medications: 2 mL lidocaine 1 %; 40 mg methylPREDNISolone acetate 40 MG/ML  Aspiration/Injection Procedure Note [name] 20-Jun-1951  Procedure: RightnLarge Joint Aspiration / Injection of Knee Indications: Pain  Procedure Details Patient written consent to procedure. Risks, benefits, and alternatives explained. Sterilely prepped with alcohol. Ethyl cholride used for anesthesia. 2 cc Lidocaine 1% mixed with 1 mL of DEPO-MEDROL 40 mg injected using the lateral approach without difficulty. No complications with procedure and tolerated well.   Consent was given by the  patient.       Clinical Data: No additional findings.   Subjective: Chief Complaint  Patient presents with   Right Knee - Pain    Patient is presenting for right-sided knee pain that has been going on for years now.  Patient states that over the past year her knee pain has become severe.  Patient has been using a cane over the past few months due to the knee pain.  Patient notes that she has arthritic changes as she was told in the past.  Patient states that she would like to get some relief today.  Patient is aware that she may need surgery in the near future.    Review of Systems  Constitutional: Negative.   HENT: Negative.    Eyes: Negative.   Respiratory: Negative.    Cardiovascular: Negative.   Endocrine: Negative.   Musculoskeletal: Negative.   Neurological: Negative.   Hematological: Negative.   Psychiatric/Behavioral: Negative.    All other systems reviewed and are negative.    Objective: Vital Signs: Ht 5\' 2"  (1.575 m)   Wt 239 lb (108.4 kg)   BMI 43.71 kg/m   Physical Exam Vitals and nursing note reviewed.  Constitutional:      Appearance: She is well-developed.  HENT:     Head: Atraumatic.     Nose: Nose normal.  Eyes:     Extraocular Movements: Extraocular movements intact.  Cardiovascular:     Pulses: Normal  pulses.  Pulmonary:     Effort: Pulmonary effort is normal.  Abdominal:     Palpations: Abdomen is soft.  Musculoskeletal:     Cervical back: Neck supple.  Skin:    General: Skin is warm.     Capillary Refill: Capillary refill takes less than 2 seconds.  Neurological:     Mental Status: She is alert. Mental status is at baseline.  Psychiatric:        Behavior: Behavior normal.        Thought Content: Thought content normal.        Judgment: Judgment normal.     Ortho Exam Inspection reveals some swelling of the right knee compared to the left.  There is tenderness to palpation over the medial joint line on the right side.  Range  of motion is full though some grinding noted with patellar apprehension and gliding.  Strength is 5 out of 5 with knee flexion extension though pain noted against active resistance with knee flexion Specialty Comments:  No specialty comments available.  Imaging: XR KNEE 3 VIEW RIGHT Result Date: 12/02/2023 X-rays of the right knee show severe varus DJD with bone-on-bone joint space narrowing of the medial and patellofemoral compartment with widespread osteophytic changes.    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/24/2022   Port-A-Cath in place 03/27/2020   Recurrent cancer of left breast (HCC) 02/14/2020   Recurrent breast adenocarcinoma (HCC) 01/30/2020   Essential hypertension 12/07/2019   Dyspnea on exertion 11/11/2018   Right leg pain 11/11/2018   Obesity, morbid, BMI 40.0-49.9 (HCC) 10/11/2015   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   Genetic testing 05/24/2015   Elevated LFTs 03/08/2015   Anemia in neoplastic disease 06/12/2014   Hypokalemia 05/29/2014   Watery eyes 05/09/2014   Malignant neoplasm of colon (HCC) 04/03/2014   Family history of malignant neoplasm of breast 04/03/2014   Malignant neoplasm of upper-outer quadrant of left breast in female, estrogen receptor negative (HCC) 03/01/2014   Past Medical  History:  Diagnosis Date   Colon cancer (HCC)    Hx of rotator cuff surgery    Hypertension    left breast cancer    Personal history of chemotherapy 2016   left   Personal history of radiation therapy 2016   left breast    Radiation 11/15/14-01/02/15   left breast, axillary and supraclavicular region 45 gray, lumpectomy cavity boosted to 16 gray    Family History  Problem Relation Age of Onset   Cancer Mother 28       breast   Breast cancer Mother        early 58s   Stroke Father 54       cause of death   Diabetes Father    Cancer Maternal Aunt        breast cancer at unknown age   Breast  cancer Maternal Aunt    Ovarian cancer Neg Hx     Past Surgical History:  Procedure Laterality Date   ABDOMINAL HYSTERECTOMY  1988   BREAST EXCISIONAL BIOPSY Right 12/21/2017   BREAST LUMPECTOMY Left 2016   COLON SURGERY  1988   colectomy/colostomy-ca   COLON SURGERY  1988   colostomy takedown-reversal   COLONOSCOPY     EXCISION / BIOPSY BREAST / NIPPLE / DUCT Left 09/20/2014   IR IMAGING GUIDED PORT INSERTION  03/22/2020   MASTECTOMY Left 02/14/2020   MASTECTOMY W/ SENTINEL NODE BIOPSY Left 02/14/2020   Procedure: LEFT MASTECTOMY WITH BLUE DYE INJECTION;  Surgeon: Emelia Loron, MD;  Location: [facility];  Service: General;  Laterality: Left;  PEC BLOCK   RADIOACTIVE SEED GUIDED EXCISIONAL BREAST BIOPSY Right 12/21/2017   Procedure: RIGHT RADIOACTIVE SEED GUIDED EXCISIONAL BREAST BIOPSY ERAS PATHWAY;  Surgeon: Emelia Loron, MD;  Location: [facility];  Service: General;  Laterality: Right;   RADIOACTIVE SEED GUIDED PARTIAL MASTECTOMY WITH AXILLARY SENTINEL LYMPH NODE BIOPSY Left 09/20/2014   Procedure: RADIOACTIVE SEED GUIDED LEFT BREAST LUMPECTOMY WITH AXILLARY SENTINEL LYMPH NODE BIOPSY;  Surgeon: Emelia Loron, MD;  Location: [facility];  Service: General;  Laterality: Left;   TOTAL SHOULDER ARTHROPLASTY  2009   right   Social History   Occupational  History    Employer: [employer]    Comment: retired in 2008  Tobacco Use   Smoking status: Never   Smokeless tobacco: Never  Vaping Use   Vaping status: Never Used  Substance and Sexual Activity   Alcohol use: Not Currently    Comment: occ-rare   Drug use: No   Sexual activity: Not Currently

## 2023-12-18 ENCOUNTER — Inpatient Hospital Stay: Attending: Hematology and Oncology

## 2023-12-18 VITALS — BP 122/79 | HR 91 | Temp 98.9°F | Resp 16

## 2023-12-18 DIAGNOSIS — C50812 Malignant neoplasm of overlapping sites of left female breast: Secondary | ICD-10-CM | POA: Diagnosis not present

## 2023-12-18 DIAGNOSIS — C50412 Malignant neoplasm of upper-outer quadrant of left female breast: Secondary | ICD-10-CM

## 2023-12-18 DIAGNOSIS — Z5111 Encounter for antineoplastic chemotherapy: Secondary | ICD-10-CM | POA: Diagnosis not present

## 2023-12-18 DIAGNOSIS — Z95828 Presence of other vascular implants and grafts: Secondary | ICD-10-CM

## 2023-12-18 DIAGNOSIS — Z171 Estrogen receptor negative status [ER-]: Secondary | ICD-10-CM | POA: Insufficient documentation

## 2023-12-18 DIAGNOSIS — C50919 Malignant neoplasm of unspecified site of unspecified female breast: Secondary | ICD-10-CM

## 2023-12-18 DIAGNOSIS — Z79899 Other long term (current) drug therapy: Secondary | ICD-10-CM | POA: Insufficient documentation

## 2023-12-18 MED ORDER — FULVESTRANT 250 MG/5ML IM SOSY
500.0000 mg | PREFILLED_SYRINGE | Freq: Once | INTRAMUSCULAR | Status: AC
  Administered 2023-12-18: 500 mg via INTRAMUSCULAR
  Filled 2023-12-18: qty 10

## 2024-01-15 ENCOUNTER — Inpatient Hospital Stay: Attending: Hematology and Oncology

## 2024-01-15 VITALS — BP 133/74 | HR 99 | Temp 98.5°F | Resp 18

## 2024-01-15 DIAGNOSIS — Z79899 Other long term (current) drug therapy: Secondary | ICD-10-CM | POA: Diagnosis not present

## 2024-01-15 DIAGNOSIS — Z5111 Encounter for antineoplastic chemotherapy: Secondary | ICD-10-CM | POA: Insufficient documentation

## 2024-01-15 DIAGNOSIS — C50812 Malignant neoplasm of overlapping sites of left female breast: Secondary | ICD-10-CM | POA: Diagnosis not present

## 2024-01-15 DIAGNOSIS — Z95828 Presence of other vascular implants and grafts: Secondary | ICD-10-CM

## 2024-01-15 DIAGNOSIS — C50919 Malignant neoplasm of unspecified site of unspecified female breast: Secondary | ICD-10-CM

## 2024-01-15 DIAGNOSIS — Z171 Estrogen receptor negative status [ER-]: Secondary | ICD-10-CM | POA: Insufficient documentation

## 2024-01-15 DIAGNOSIS — C50412 Malignant neoplasm of upper-outer quadrant of left female breast: Secondary | ICD-10-CM

## 2024-01-15 MED ORDER — FULVESTRANT 250 MG/5ML IM SOSY
500.0000 mg | PREFILLED_SYRINGE | Freq: Once | INTRAMUSCULAR | Status: AC
Start: 2024-01-15 — End: 2024-01-15
  Administered 2024-01-15: 500 mg via INTRAMUSCULAR
  Filled 2024-01-15: qty 10

## 2024-01-27 ENCOUNTER — Ambulatory Visit: Payer: PPO

## 2024-01-27 DIAGNOSIS — Z0000 Encounter for general adult medical examination without abnormal findings: Secondary | ICD-10-CM | POA: Diagnosis not present

## 2024-01-27 NOTE — Patient Instructions (Signed)
 [name] , Thank you for taking time out of your busy schedule to complete your Annual Wellness Visit with me. I enjoyed our conversation and look forward to speaking with you again next year. I, as well as your care team,  appreciate your ongoing commitment to your health goals. Please review the following plan we discussed and let me know if I can assist you in the future. Your Game plan/ To Do List    Referrals: If you haven't heard from the office you've been referred to, please reach out to them at the phone provided.  N/a Follow up Visits: Next Medicare AWV with our clinical staff: office will schedule   Have you seen your provider in the last 6 months (3 months if uncontrolled diabetes)? Yes Next Office Visit with your provider: 03/17/2024 at 9:20  Clinician Recommendations:  Aim for 30 minutes of exercise or brisk walking, 6-8 glasses of water, and 5 servings of fruits and vegetables each day.       This is a list of the screening recommended for you and due dates:  Health Maintenance  Topic Date Due   Zoster (Shingles) Vaccine (1 of 2) 02/24/2024*   DTaP/Tdap/Td vaccine (1 - Tdap) 08/24/2024*   Pneumonia Vaccine (1 of 2 - PCV) 01/26/2025*   COVID-19 Vaccine (3 - Moderna risk series) 12/24/2027*   Flu Shot  04/15/2024   Medicare Annual Wellness Visit  01/26/2025   Mammogram  03/05/2025   Colon Cancer Screening  08/13/2028   DEXA scan (bone density measurement)  Completed   Hepatitis C Screening  Completed   HPV Vaccine  Aged Out   Meningitis B Vaccine  Aged Out  *Topic was postponed. The date shown is not the original due date.    Advanced directives: (Declined) Advance directive discussed with you today. Even though you declined this today, please call our office should you change your mind, and we can give you the proper paperwork for you to fill out. Advance Care Planning is important because it:  [x]  Makes sure you receive the medical care that is consistent with your  values, goals, and preferences  [x]  It provides guidance to your family and loved ones and reduces their decisional burden about whether or not they are making the right decisions based on your wishes.  Follow the link provided in your after visit summary or read over the paperwork we have mailed to you to help you started getting your Advance Directives in place. If you need assistance in completing these, please reach out to us  so that we can help you!  See attachments for Preventive Care and Fall Prevention Tips.

## 2024-01-27 NOTE — Progress Notes (Signed)
 Subjective:   [name] is a 73 y.o. who presents for a Medicare Wellness preventive visit.  As a reminder, Annual Wellness Visits don't include a physical exam, and some assessments may be limited, especially if this visit is performed virtually. We may recommend an in-person visit if needed.  Visit Complete: Virtual I connected with  [name] on 01/27/24 by a audio enabled telemedicine application and verified that I am speaking with the correct person using two identifiers.  Patient Location: Home  Provider Location: Office/Clinic  I discussed the limitations of evaluation and management by telemedicine. The patient expressed understanding and agreed to proceed.  Vital Signs: Because this visit was a virtual/telehealth visit, some criteria may be missing or patient reported. Any vitals not documented were not able to be obtained and vitals that have been documented are patient reported.  VideoError- Librarian, academic were attempted between this provider and patient, however failed, due to patient having technical difficulties OR patient did not have access to video capability.  We continued and completed visit with audio only.   Persons Participating in Visit: Patient.  AWV Questionnaire: No: Patient Medicare AWV questionnaire was not completed prior to this visit.  Cardiac Risk Factors include: advanced age (>3men, >24 women);hypertension     Objective:     Today's Vitals   There is no height or weight on file to calculate BMI.     01/27/2024   10:04 AM 12/24/2022   10:25 AM 01/06/2022    9:06 AM 12/18/2021   10:24 AM 12/12/2020    9:09 AM 07/11/2020   10:13 AM 06/20/2020    9:54 AM  Advanced Directives  Does Patient Have a Medical Advance Directive? No No No No No Yes Yes  Type of Advance Directive      Living will Living will  Does patient want to make changes to medical advance directive?      No - Patient declined    Would patient like information on creating a medical advance directive?   Yes (MAU/Ambulatory/Procedural Areas - Information given) No - Patient declined No - Patient declined No - Patient declined No - Patient declined    Current Medications (verified) Outpatient Encounter Medications as of 01/27/2024  Medication Sig   acetaminophen  (TYLENOL ) 500 MG tablet Take 1,000 mg by mouth every 6 (six) hours as needed for mild pain or moderate pain.   diclofenac  Sodium (VOLTAREN ) 1 % GEL Apply 2 g topically 4 (four) times daily.   gabapentin  (NEURONTIN ) 300 MG capsule Take 1 capsule (300 mg total) by mouth at bedtime.   hydroxypropyl methylcellulose / hypromellose (ISOPTO TEARS / GONIOVISC) 2.5 % ophthalmic solution Place 1 drop into both eyes daily as needed for dry eyes.   Liniments (SALONPAS ARTHRITIS PAIN RELIEF EX) Apply 1 application topically daily as needed (knee pain).   lisinopril -hydrochlorothiazide  (ZESTORETIC ) 20-12.5 MG tablet TAKE 1/2 TABLET BY MOUTH DAILY   LUMIGAN  0.01 % SOLN    ursodiol  (ACTIGALL ) 500 MG tablet Take 500 mg by mouth 3 (three) times daily.    Multiple Vitamins-Minerals (AIRBORNE PO) Take 1 tablet by mouth 2 (two) times a week. (Patient not taking: Reported on 01/27/2024)   [DISCONTINUED] prochlorperazine  (COMPAZINE ) 10 MG tablet Take 1 tablet (10 mg total) by mouth every 6 (six) hours as needed (Nausea or vomiting).   No facility-administered encounter medications on file as of 01/27/2024.    Allergies (verified) Patient has no known allergies.   History: Past Medical  History:  Diagnosis Date   Colon cancer (HCC)    Hx of rotator cuff surgery    Hypertension    left breast cancer    Personal history of chemotherapy 2016   left   Personal history of radiation therapy 2016   left breast    Radiation 11/15/14-01/02/15   left breast, axillary and supraclavicular region 45 gray, lumpectomy cavity boosted to 16 gray   Past Surgical History:  Procedure Laterality  Date   ABDOMINAL HYSTERECTOMY  1988   BREAST EXCISIONAL BIOPSY Right 12/21/2017   BREAST LUMPECTOMY Left 2016   COLON SURGERY  1988   colectomy/colostomy-ca   COLON SURGERY  1988   colostomy takedown-reversal   COLONOSCOPY     EXCISION / BIOPSY BREAST / NIPPLE / DUCT Left 09/20/2014   IR IMAGING GUIDED PORT INSERTION  03/22/2020   MASTECTOMY Left 02/14/2020   MASTECTOMY W/ SENTINEL NODE BIOPSY Left 02/14/2020   Procedure: LEFT MASTECTOMY WITH BLUE DYE INJECTION;  Surgeon: Enid Harry, MD;  Location: [facility];  Service: General;  Laterality: Left;  PEC BLOCK   RADIOACTIVE SEED GUIDED EXCISIONAL BREAST BIOPSY Right 12/21/2017   Procedure: RIGHT RADIOACTIVE SEED GUIDED EXCISIONAL BREAST BIOPSY ERAS PATHWAY;  Surgeon: Enid Harry, MD;  Location: [facility];  Service: General;  Laterality: Right;   RADIOACTIVE SEED GUIDED PARTIAL MASTECTOMY WITH AXILLARY SENTINEL LYMPH NODE BIOPSY Left 09/20/2014   Procedure: RADIOACTIVE SEED GUIDED LEFT BREAST LUMPECTOMY WITH AXILLARY SENTINEL LYMPH NODE BIOPSY;  Surgeon: Enid Harry, MD;  Location: [facility];  Service: General;  Laterality: Left;   TOTAL SHOULDER ARTHROPLASTY  2009   right   Family History  Problem Relation Age of Onset   Cancer Mother 53       breast   Breast cancer Mother        early 2s   Stroke Father 53       cause of death   Diabetes Father    Cancer Maternal Aunt        breast cancer at unknown age   Breast cancer Maternal Aunt    Ovarian cancer Neg Hx    Social History   Socioeconomic History   Marital status: Divorced    Spouse name: Not on file   Number of children: 2   Years of education: Not on file   Highest education level: Not on file  Occupational History    Employer: [employer]    Comment: retired in 2008  Tobacco Use   Smoking status: Never   Smokeless tobacco: Never  Vaping Use   Vaping status: Never Used  Substance and Sexual Activity   Alcohol  use: Not  Currently    Comment: occ-rare   Drug use: No   Sexual activity: Not Currently  Other Topics Concern   Not on file  Social History Narrative   Not on file   Social Drivers of Health   Financial Resource Strain: Low Risk  (01/27/2024)   Overall Financial Resource Strain (CARDIA)    Difficulty of Paying Living Expenses: Not hard at all  Food Insecurity: No Food Insecurity (01/27/2024)   Hunger Vital Sign    Worried About Running Out of Food in the Last Year: Never true    Ran Out of Food in the Last Year: Never true  Transportation Needs: No Transportation Needs (01/27/2024)   PRAPARE - Administrator, Civil Service (Medical): No    Lack of Transportation (Non-Medical): No  Physical Activity:  Inactive (01/27/2024)   Exercise Vital Sign    Days of Exercise per Week: 0 days    Minutes of Exercise per Session: 0 min  Stress: No Stress Concern Present (01/27/2024)   [employer] of Occupational Health - Occupational Stress Questionnaire    Feeling of Stress : Not at all  Social Connections: Moderately Isolated (01/27/2024)   Social Connection and Isolation Panel [NHANES]    Frequency of Communication with Friends and Family: More than three times a week    Frequency of Social Gatherings with Friends and Family: More than three times a week    Attends Religious Services: More than 4 times per year    Active Member of [employer] or Organizations: No    Attends Banker Meetings: Never    Marital Status: Divorced    Tobacco Counseling Counseling given: Not Answered    Clinical Intake:  Pre-visit preparation completed: Yes  Pain : No/denies pain     Nutritional Risks: None Diabetes: No  Lab Results  Component Value Date   HGBA1C 5.9 (H) 08/25/2023   HGBA1C 5.8 (H) 12/24/2022   HGBA1C 5.9 (H) 12/18/2021     How often do you need to have someone help you when you read instructions, pamphlets, or other written materials from your doctor or pharmacy?:  1 - Never  Interpreter Needed?: No  Information entered by :: NAllen LPN   Activities of Daily Living     01/27/2024    9:59 AM  In your present state of health, do you have any difficulty performing the following activities:  Hearing? 0  Vision? 0  Difficulty concentrating or making decisions? 0  Walking or climbing stairs? 0  Dressing or bathing? 0  Doing errands, shopping? 0  Preparing Food and eating ? N  Using the Toilet? N  In the past six months, have you accidently leaked urine? N  Do you have problems with loss of bowel control? N  Managing your Medications? N  Managing your Finances? N  Housekeeping or managing your Housekeeping? N    Patient Care Team: Susanna Epley, FNP as PCP - General (General Practice) Enid Harry, MD as Consulting Physician (General Surgery) Retta Caster, MD as Consulting Physician (Radiation Oncology) Ben Bracken, MD as Consulting Physician (Ophthalmology) Murleen Arms, MD as Consulting Physician (Hematology and Oncology)  Indicate any recent Medical Services you may have received from other than [facility] providers in the past year (date may be approximate).     Assessment:    This is a routine wellness examination for [place].  Hearing/Vision screen Hearing Screening - Comments:: Denies hearing issues Vision Screening - Comments:: Regular eye exams, Dr. Gwendalyn Lemma   Goals Addressed             This Visit's Progress    Patient Stated       01/27/2024, wants to lose weight and get out more       Depression Screen     01/27/2024   10:05 AM 08/25/2023   11:44 AM 12/24/2022   10:27 AM 12/18/2021   10:24 AM 12/18/2021    9:44 AM 12/12/2020    9:10 AM 12/07/2019   10:40 AM  PHQ 2/9 Scores  PHQ - 2 Score 0 0 0 0 0 0 0  PHQ- 9 Score 3 0   0  3    Fall Risk     01/27/2024   10:05 AM 08/25/2023   11:44 AM 12/24/2022   10:27 AM 12/18/2021  10:24 AM 12/18/2021    9:44 AM  Fall Risk   Falls in the past year? 0 0 0 0 0   Number falls in past yr: 0 0  0 0  Injury with Fall? 0 0  0 0  Risk for fall due to : Medication side effect;Impaired mobility;Impaired balance/gait No Fall Risks  Medication side effect No Fall Risks  Follow up Falls prevention discussed;Falls evaluation completed Falls evaluation completed  Falls evaluation completed;Education provided;Falls prevention discussed Falls evaluation completed    MEDICARE RISK AT HOME:  Medicare Risk at Home Any stairs in or around the home?: Yes If so, are there any without handrails?: No Home free of loose throw rugs in walkways, pet beds, electrical cords, etc?: Yes Adequate lighting in your home to reduce risk of falls?: Yes Life alert?: No Use of a cane, walker or w/c?: Yes Grab bars in the bathroom?: Yes Shower chair or bench in shower?: Yes Elevated toilet seat or a handicapped toilet?: No  TIMED UP AND GO:  Was the test performed?  No  Cognitive Function: 6CIT completed        01/27/2024   10:07 AM 12/24/2022   11:15 AM 12/24/2022   11:14 AM 12/18/2021   10:25 AM 12/12/2020    9:11 AM  6CIT Screen  What Year? 0 points 0 points 0 points 0 points 0 points  What month? 0 points 0 points 0 points 0 points 0 points  What time? 0 points 0 points 0 points 0 points 0 points  Count back from 20 0 points 0 points 0 points 0 points 0 points  Months in reverse 0 points 2 points 2 points 0 points 0 points  Repeat phrase 0 points 0 points  0 points 0 points  Total Score 0 points 2 points  0 points 0 points    Immunizations Immunization History  Administered Date(s) Administered   Influenza, High Dose Seasonal PF 05/12/2019   Moderna SARS-COV2 Booster Vaccination 11/13/2020   Moderna Sars-Covid-2 Vaccination 10/30/2019, 11/27/2019    Screening Tests Health Maintenance  Topic Date Due   Zoster Vaccines- Shingrix (1 of 2) 02/24/2024 (Originally 01/07/1970)   DTaP/Tdap/Td (1 - Tdap) 08/24/2024 (Originally [birth year]07/1970)   Pneumonia Vaccine 65+ Years  old (1 of 2 - PCV) 01/26/2025 (Originally 01[birth year]07/1970)   COVID-19 Vaccine (3 - Moderna risk series) 12/24/2027 (Originally 12/11/2020)   INFLUENZA VACCINE  04/15/2024   Medicare Annual Wellness (AWV)  01/26/2025   MAMMOGRAM  03/05/2025   Colonoscopy  08/13/2028   DEXA SCAN  Completed   Hepatitis C Screening  Completed   HPV VACCINES  Aged Out   Meningococcal B Vaccine  Aged Out    Health Maintenance  There are no preventive care reminders to display for this patient.  Health Maintenance Items Addressed: Declines vaccines  Additional Screening:  Vision Screening: Recommended annual ophthalmology exams for early detection of glaucoma and other disorders of the eye.  Dental Screening: Recommended annual dental exams for proper oral hygiene  Community Resource Referral / Chronic Care Management: CRR required this visit?  No   CCM required this visit?  No   Plan:    I have personally reviewed and noted the following in the patient's chart:   Medical and social history Use of alcohol , tobacco or illicit drugs  Current medications and supplements including opioid prescriptions. Patient is not currently taking opioid prescriptions. Functional ability and status Nutritional status Physical activity Advanced directives List of other physicians Hospitalizations, surgeries,  and ER visits in previous 12 months Vitals Screenings to include cognitive, depression, and falls Referrals and appointments  In addition, I have reviewed and discussed with patient certain preventive protocols, quality metrics, and best practice recommendations. A written personalized care plan for preventive services as well as general preventive health recommendations were provided to patient.   Areatha Beecham, LPN   9/60/[id number]   After Visit Summary: (MyChart) Due to this being a telephonic visit, the after visit summary with patients personalized plan was offered to patient via MyChart   Notes: Nothing  significant to report at this time.

## 2024-02-10 ENCOUNTER — Other Ambulatory Visit: Payer: Self-pay | Admitting: Internal Medicine

## 2024-02-10 DIAGNOSIS — Z1231 Encounter for screening mammogram for malignant neoplasm of breast: Secondary | ICD-10-CM

## 2024-02-12 ENCOUNTER — Inpatient Hospital Stay

## 2024-02-12 VITALS — BP 146/90 | HR 85 | Temp 98.6°F | Resp 18

## 2024-02-12 DIAGNOSIS — Z95828 Presence of other vascular implants and grafts: Secondary | ICD-10-CM

## 2024-02-12 DIAGNOSIS — Z171 Estrogen receptor negative status [ER-]: Secondary | ICD-10-CM

## 2024-02-12 DIAGNOSIS — Z5111 Encounter for antineoplastic chemotherapy: Secondary | ICD-10-CM | POA: Diagnosis not present

## 2024-02-12 DIAGNOSIS — C50919 Malignant neoplasm of unspecified site of unspecified female breast: Secondary | ICD-10-CM

## 2024-02-12 MED ORDER — FULVESTRANT 250 MG/5ML IM SOSY
500.0000 mg | PREFILLED_SYRINGE | Freq: Once | INTRAMUSCULAR | Status: AC
  Administered 2024-02-12: 500 mg via INTRAMUSCULAR
  Filled 2024-02-12: qty 10

## 2024-03-07 ENCOUNTER — Ambulatory Visit
Admission: RE | Admit: 2024-03-07 | Discharge: 2024-03-07 | Disposition: A | Discharge status: Home / Self Care | Source: Ambulatory Visit | Attending: Internal Medicine | Admitting: Internal Medicine

## 2024-03-07 DIAGNOSIS — Z1231 Encounter for screening mammogram for malignant neoplasm of breast: Secondary | ICD-10-CM | POA: Diagnosis not present

## 2024-03-11 ENCOUNTER — Inpatient Hospital Stay: Attending: Hematology and Oncology

## 2024-03-11 VITALS — BP 142/84 | HR 88 | Resp 17

## 2024-03-11 DIAGNOSIS — C50812 Malignant neoplasm of overlapping sites of left female breast: Secondary | ICD-10-CM | POA: Insufficient documentation

## 2024-03-11 DIAGNOSIS — C50412 Malignant neoplasm of upper-outer quadrant of left female breast: Secondary | ICD-10-CM

## 2024-03-11 DIAGNOSIS — Z5111 Encounter for antineoplastic chemotherapy: Secondary | ICD-10-CM | POA: Insufficient documentation

## 2024-03-11 DIAGNOSIS — Z171 Estrogen receptor negative status [ER-]: Secondary | ICD-10-CM | POA: Insufficient documentation

## 2024-03-11 DIAGNOSIS — C50919 Malignant neoplasm of unspecified site of unspecified female breast: Secondary | ICD-10-CM

## 2024-03-11 DIAGNOSIS — Z95828 Presence of other vascular implants and grafts: Secondary | ICD-10-CM

## 2024-03-11 MED ORDER — FULVESTRANT 250 MG/5ML IM SOSY
500.0000 mg | PREFILLED_SYRINGE | Freq: Once | INTRAMUSCULAR | Status: AC
  Administered 2024-03-11: 500 mg via INTRAMUSCULAR
  Filled 2024-03-11: qty 10

## 2024-03-12 ENCOUNTER — Encounter (HOSPITAL_COMMUNITY): Payer: Self-pay | Admitting: Interventional Radiology

## 2024-03-17 ENCOUNTER — Ambulatory Visit: Admitting: Nurse Practitioner

## 2024-04-07 ENCOUNTER — Telehealth: Payer: Self-pay

## 2024-04-07 ENCOUNTER — Other Ambulatory Visit: Payer: Self-pay | Admitting: *Deleted

## 2024-04-07 DIAGNOSIS — C50412 Malignant neoplasm of upper-outer quadrant of left female breast: Secondary | ICD-10-CM

## 2024-04-07 DIAGNOSIS — C189 Malignant neoplasm of colon, unspecified: Secondary | ICD-10-CM

## 2024-04-08 ENCOUNTER — Inpatient Hospital Stay: Admitting: Hematology and Oncology

## 2024-04-08 ENCOUNTER — Inpatient Hospital Stay: Attending: Hematology and Oncology

## 2024-04-08 ENCOUNTER — Inpatient Hospital Stay

## 2024-04-08 VITALS — BP 121/76 | HR 91 | Temp 97.9°F | Resp 19 | Ht 62.0 in | Wt 236.9 lb

## 2024-04-08 DIAGNOSIS — Z171 Estrogen receptor negative status [ER-]: Secondary | ICD-10-CM | POA: Diagnosis not present

## 2024-04-08 DIAGNOSIS — C50412 Malignant neoplasm of upper-outer quadrant of left female breast: Secondary | ICD-10-CM | POA: Diagnosis not present

## 2024-04-08 DIAGNOSIS — Z95828 Presence of other vascular implants and grafts: Secondary | ICD-10-CM

## 2024-04-08 DIAGNOSIS — C50919 Malignant neoplasm of unspecified site of unspecified female breast: Secondary | ICD-10-CM

## 2024-04-08 DIAGNOSIS — C50812 Malignant neoplasm of overlapping sites of left female breast: Secondary | ICD-10-CM | POA: Diagnosis not present

## 2024-04-08 DIAGNOSIS — Z5111 Encounter for antineoplastic chemotherapy: Secondary | ICD-10-CM | POA: Diagnosis not present

## 2024-04-08 DIAGNOSIS — C189 Malignant neoplasm of colon, unspecified: Secondary | ICD-10-CM

## 2024-04-08 LAB — CMP (CANCER CENTER ONLY)
ALT: 9 U/L (ref 0–44)
AST: 12 U/L — ABNORMAL LOW (ref 15–41)
Albumin: 4.1 g/dL (ref 3.5–5.0)
Alkaline Phosphatase: 124 U/L (ref 38–126)
Anion gap: 8 (ref 5–15)
BUN: 11 mg/dL (ref 8–23)
CO2: 26 mmol/L (ref 22–32)
Calcium: 9.9 mg/dL (ref 8.9–10.3)
Chloride: 106 mmol/L (ref 98–111)
Creatinine: 0.93 mg/dL (ref 0.44–1.00)
GFR, Estimated: >60 mL/min (ref >60)
Glucose, Bld: 116 mg/dL — ABNORMAL HIGH (ref 70–99)
Potassium: 3.8 mmol/L (ref 3.5–5.1)
Sodium: 140 mmol/L (ref 135–145)
Total Bilirubin: 0.9 mg/dL (ref 0.0–1.2)
Total Protein: 7.8 g/dL (ref 6.5–8.1)

## 2024-04-08 LAB — CBC WITH DIFFERENTIAL (CANCER CENTER ONLY)
Abs Immature Granulocytes: 0.01 K/uL (ref 0.00–0.07)
Basophils Absolute: 0 K/uL (ref 0.0–0.1)
Basophils Relative: 1 %
Eosinophils Absolute: 0.1 K/uL (ref 0.0–0.5)
Eosinophils Relative: 2 %
HCT: 35.5 % — ABNORMAL LOW (ref 36.0–46.0)
Hemoglobin: 11.7 g/dL — ABNORMAL LOW (ref 12.0–15.0)
Immature Granulocytes: 0 %
Lymphocytes Relative: 35 %
Lymphs Abs: 1.8 K/uL (ref 0.7–4.0)
MCH: 29.7 pg (ref 26.0–34.0)
MCHC: 33 g/dL (ref 30.0–36.0)
MCV: 90.1 fL (ref 80.0–100.0)
Monocytes Absolute: 0.4 K/uL (ref 0.1–1.0)
Monocytes Relative: 8 %
Neutro Abs: 2.8 K/uL (ref 1.7–7.7)
Neutrophils Relative %: 54 %
Platelet Count: 304 K/uL (ref 150–400)
RBC: 3.94 MIL/uL (ref 3.87–5.11)
RDW: 12.4 % (ref 11.5–15.5)
WBC Count: 5.1 K/uL (ref 4.0–10.5)
nRBC: 0 % (ref 0.0–0.2)

## 2024-04-08 MED ORDER — FULVESTRANT 250 MG/5ML IM SOSY
500.0000 mg | PREFILLED_SYRINGE | Freq: Once | INTRAMUSCULAR | Status: AC
  Administered 2024-04-08: 500 mg via INTRAMUSCULAR
  Filled 2024-04-08: qty 10

## 2024-04-08 NOTE — Progress Notes (Signed)
 [name]  [phone] [phone]    ID: [name] DOB: 1951-04-16  MR#: [id number]  RDW#:[id number]  Patient Care Team: Ruth Speaks, FNP as PCP - General (General Practice) Ruth Cough, MD as Consulting Physician (General Surgery) Ruth Agent, MD as Consulting Physician (Radiation Oncology) Ruth Carwin, MD as Consulting Physician (Ophthalmology) Ruth Ash, MD as Consulting Physician (Hematology and Oncology)   CHIEF COMPLAINT: triple negative breast cancer; PALB2 positive (s/p left mastectomy)  CURRENT TREATMENT: fulvestrant   INTERVAL HISTORY:  Discussed the use of AI scribe software for clinical note transcription with the patient, who gave verbal consent to proceed.  History of Present Illness [name] is a 73 year old female with breast cancer on adj faslodex  lymphedema who presents with persistent arm pain.  She has been experiencing persistent arm pain for the past couple of weeks, describing it as a constant headache-like discomfort. The pain is present even upon waking and is exacerbated by certain movements. She uses Tylenol  for pain management but is cautious about overuse due to her aversion to taking too much medication.  She has a history of lymphedema and has had previous surgery. She wears a compression sleeve intermittently, which she believes helps to some extent, although she does not wear it consistently. She engages in exercises at home, including arm movements and using a treadmill, to manage her symptoms.  She mentions experiencing a 'weird smell' in her urine associated with receiving shots, although this symptom has improved recently. She is currently on a treatment regimen that includes shots, with the next one scheduled for January 2027.  She wants to lose weight but admits to not putting in much effort. She cooks most of her meals at home.     COVID 19 VACCINATION STATUS: fully  vaccinated [name]), with booster 11/2020   BREAST CANCER HISTORY: From the original intake note:  [name] herself palpated a mass in her left breast, and immediately brought it to the attention of her primary care physician, Dr. Jarold, who confirmed the finding and setup the patient for bilateral diagnostic mammography at the breast Center 02/20/2014. This showed a high density mass in the outer left breast measuring up to 3.7 cm. It corresponded to the site of palpable concern. In addition the normal 4 logically abnormal lymph nodes in the left axilla. The mass was palpable by exam, and by ultrasonography measured 2.6 cm. There were enlarged and morphologically abnormal lymph nodes in the left axilla, largest measuring 3 cm.  Biopsy of the left breast mass in question and 1 of the abnormal axillary lymph nodes 02/22/2014 showed ([id number]) biopsies to be positive for an invasive ductal carcinoma, grade 3, triple negative, with an MIB-1 of 90%. Specifically the HER-2 signals ratio was 1.05, and the number per cell 2.00.  MRI of the breasts 03/01/2014 showed a 3.8 cm enhancing mass with areas of apparent necrosis in the left breast, as well as the biopsy tract extending laterally, the combined measure 5.3 cm. There were no other masses or areas of abnormal enhancement. There were multiple abnormally enlarged left axillary lymph nodes with loss of the normal fatty hilum. These included level I and retropectoral lymph nodes.  The patient's subsequent history is as detailed below   PAST MEDICAL HISTORY: Past Medical History:  Diagnosis Date   Breast cancer (HCC) 2021   mastectomy   Colon cancer (HCC)    Hx of rotator cuff surgery    Hypertension    left  breast cancer    Personal history of chemotherapy 2021   left   Personal history of radiation therapy 2016   left breast    Radiation 11/15/14-01/02/15   left breast, axillary and supraclavicular region 45 gray, lumpectomy cavity boosted to  16 gray    PAST SURGICAL HISTORY: Past Surgical History:  Procedure Laterality Date   ABDOMINAL HYSTERECTOMY  1988   BREAST EXCISIONAL BIOPSY Right 12/21/2017   BREAST LUMPECTOMY Left 2016   COLON SURGERY  1988   colectomy/colostomy-ca   COLON SURGERY  1988   colostomy takedown-reversal   COLONOSCOPY     EXCISION / BIOPSY BREAST / NIPPLE / DUCT Left 09/20/2014   IR IMAGING GUIDED PORT INSERTION  03/22/2020   MASTECTOMY Left 02/14/2020   MASTECTOMY W/ SENTINEL NODE BIOPSY Left 02/14/2020   Procedure: LEFT MASTECTOMY WITH BLUE DYE INJECTION;  Surgeon: Ruth Cough, MD;  Location: [facility];  Service: General;  Laterality: Left;  PEC BLOCK   RADIOACTIVE SEED GUIDED EXCISIONAL BREAST BIOPSY Right 12/21/2017   Procedure: RIGHT RADIOACTIVE SEED GUIDED EXCISIONAL BREAST BIOPSY ERAS PATHWAY;  Surgeon: Ruth Cough, MD;  Location: [facility];  Service: General;  Laterality: Right;   RADIOACTIVE SEED GUIDED PARTIAL MASTECTOMY WITH AXILLARY SENTINEL LYMPH NODE BIOPSY Left 09/20/2014   Procedure: RADIOACTIVE SEED GUIDED LEFT BREAST LUMPECTOMY WITH AXILLARY SENTINEL LYMPH NODE BIOPSY;  Surgeon: Lopez Ebbie, MD;  Location: [facility];  Service: General;  Laterality: Left;   TOTAL SHOULDER ARTHROPLASTY  2009   right    FAMILY HISTORY Family History  Problem Relation Age of Onset   Breast cancer Mother 48   Stroke Father 27       cause of death   Diabetes Father    Breast cancer Maternal Aunt    Ovarian cancer Neg Hx    the patient's father died at the age of 27 following a stroke in the setting of diabetes; the patient's mother is living at 9. The patient has 3 brothers and 2 sisters. [name], [name], was diagnosed with breast cancer in her early 62s. There is no other breast or ovarian cancer in the family. The patient herself was diagnosed with watts by history he is an incidentally found stage I colon carcinoma noted at the time of her  hysterectomy. She had a partial colectomy but no adjuvant treatment. We have no records regarding that procedure   GYNECOLOGIC HISTORY:  No LMP recorded. Patient has had a hysterectomy. Menarche age 40, first live birth age 47, the patient underwent total abdominal hysterectomy with bilateral salpingo-oophorectomy in her 62s. She did not take hormone replacement. She did not take oral contraceptives at any point.   SOCIAL HISTORY: (Updated May 2021) Tiffney worked for [employer] for about 40 years, retiring in 2008. She is divorced and lives alone, with no pets.  Her grandson who is a Sports administrator frequently stays with her.  Her daughter Bobbijo Holst works for [employer] in Audiological scientist. The second daughter, Tatiyanna Lashley, works as a Conservation officer, nature at [employer].  The patient has 6 grandchildren and three great-grandchildren. She attends a [employer].     ADVANCED DIRECTIVES: Not in place. The patient intends to name her daughter Sonny DEL. her healthcare power of attorney. Sonny is work number is [phone]. On the patient's 03/01/2014 visit she was given a copy of the appropriate documents to complete and notarize at her discretion.     HEALTH MAINTENANCE: Social History   Tobacco  Use   Smoking status: Never   Smokeless tobacco: Never  Vaping Use   Vaping status: Never Used  Substance Use Topics   Alcohol  use: Not Currently    Comment: occ-rare   Drug use: No     Colonoscopy: 2000  PAP: May 2015  Bone density: 12/21/2017, T-score of -0.6  Lipid panel: 02/03/2014  No Known Allergies  Current Outpatient Medications  Medication Sig Dispense Refill   acetaminophen  (TYLENOL ) 500 MG tablet Take 1,000 mg by mouth every 6 (six) hours as needed for mild pain or moderate pain.     diclofenac  Sodium (VOLTAREN ) 1 % GEL Apply 2 g topically 4 (four) times daily. 100 g 2   gabapentin  (NEURONTIN ) 300 MG capsule Take 1 capsule (300 mg total) by mouth at bedtime. 90 capsule 4    Liniments (SALONPAS ARTHRITIS PAIN RELIEF EX) Apply 1 application topically daily as needed (knee pain).     lisinopril -hydrochlorothiazide  (ZESTORETIC ) 20-12.5 MG tablet TAKE 1/2 TABLET BY MOUTH DAILY 45 tablet 0   LUMIGAN  0.01 % SOLN      Multiple Vitamins-Minerals (AIRBORNE PO) Take 1 tablet by mouth 2 (two) times a week.     ursodiol  (ACTIGALL ) 500 MG tablet Take 500 mg by mouth 3 (three) times daily.      hydroxypropyl methylcellulose / hypromellose (ISOPTO TEARS / GONIOVISC) 2.5 % ophthalmic solution Place 1 drop into both eyes daily as needed for dry eyes.     No current facility-administered medications for this visit.    OBJECTIVE: African-American woman who appears older than stated age Vitals:   04/08/24 1241  BP: 121/76  Pulse: 91  Resp: 19  Temp: 97.9 F (36.6 C)  SpO2: 100%     Filed Weights   04/08/24 1241  Weight: 236 lb 14.4 oz (107.5 kg)     Body mass index is 43.33 kg/m.     ECOG FS:1 - Symptomatic but completely ambulatory   Physical Exam Constitutional:      Appearance: Normal appearance.  Cardiovascular:     Rate and Rhythm: Normal rate and regular rhythm.     Pulses: Normal pulses.     Heart sounds: Normal heart sounds.  Chest:     Comments: Bilateral breasts inspected.  Left breast status postmastectomy.  Right breast with no palpable masses or adenopathy.  Abdominal:     General: There is no distension.     Tenderness: There is no abdominal tenderness.  Musculoskeletal:     Cervical back: Normal range of motion and neck supple. No rigidity.  Lymphadenopathy:     Cervical: No cervical adenopathy.  Skin:    General: Skin is warm and dry.  Neurological:     General: No focal deficit present.     Mental Status: She is alert.  Psychiatric:        Mood and Affect: Mood normal.    LAB RESULTS:  No results found for: SPEP, UPEP  Lab Results  Component Value Date   WBC 5.1 04/08/2024   NEUTROABS 2.8 04/08/2024   HGB 11.7 (L)  04/08/2024   HCT 35.5 (L) 04/08/2024   MCV 90.1 04/08/2024   PLT 304 04/08/2024      Chemistry      Component Value Date/Time   NA 141 11/24/2023 0933   NA 142 11/13/2016 1050   K 4.3 11/24/2023 0933   K 4.1 11/13/2016 1050   CL 102 11/24/2023 0933   CO2 22 11/24/2023 0933   CO2 27 11/13/2016 1050  BUN 9 11/24/2023 0933   BUN 11.5 11/13/2016 1050   CREATININE 0.96 11/24/2023 0933   CREATININE 1.03 (H) 10/16/2023 1211   CREATININE 1.0 11/13/2016 1050      Component Value Date/Time   CALCIUM 9.8 11/24/2023 0933   CALCIUM 9.8 11/13/2016 1050   ALKPHOS 144 (H) 11/24/2023 0933   ALKPHOS 140 11/13/2016 1050   AST 11 11/24/2023 0933   AST 13 (L) 10/16/2023 1211   AST 13 11/13/2016 1050   ALT 10 11/24/2023 0933   ALT 15 10/16/2023 1211   ALT 15 11/13/2016 1050   BILITOT 1.0 11/24/2023 0933   BILITOT 0.8 10/16/2023 1211   BILITOT 0.86 11/13/2016 1050       No results found for: LABCA2  No components found for: LABCA125  No results for input(s): INR in the last 168 hours.  Urinalysis    Component Value Date/Time   COLORURINE YELLOW 04/05/2008 1120   APPEARANCEUR CLEAR 04/05/2008 1120   LABSPEC 1.021 04/05/2008 1120   PHURINE 6.0 04/05/2008 1120   GLUCOSEU NEGATIVE 04/05/2008 1120   HGBUR NEGATIVE 04/05/2008 1120   BILIRUBINUR negative 08/25/2023 1433   BILIRUBINUR negative 12/12/2020 1236   KETONESUR negative 08/25/2023 1433   KETONESUR NEGATIVE 04/05/2008 1120   PROTEINUR Negative 12/12/2020 1236   PROTEINUR NEGATIVE 04/05/2008 1120   UROBILINOGEN 0.2 08/25/2023 1433   UROBILINOGEN 0.2 04/05/2008 1120   NITRITE Negative 08/25/2023 1433   NITRITE negative 12/12/2020 1236   NITRITE NEGATIVE 04/05/2008 1120   LEUKOCYTESUR Negative 08/25/2023 1433    STUDIES: No results found.     ASSESSMENT: 73 y.o. BRCA negative [place] woman s/p left breast upper outer quadrant and left axillary lymph node biopsy 02/22/2014, both positive for a clinical T2 N2,  stage IIIA invasive ductal carcinoma, grade 3, triple negative, with an MIB-1 of 90%  (1) neoadjuvant chemotherapy started 03/13/2014, with cyclophosphamide  and doxorubicin  in dose dense fashion x4, with Neulasta  support on day 2, completed 04/24/2014, followed by weekly carboplatin  and paclitaxel  x12, paclitaxel  reduced during cycle 5 and cycle 7 because of elevated bilirubin levels  (2) left lumpectomy and sentinel lymph node sampling 09/20/2014 showed a complete pathologic response.  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, showed no deleterious mutations.  (b) PALB2 mutations are known to increase the risk of breast and pancreatic cancer, possibly other cancers, but the data is preliminary  (c) the patient's 2 daughters have been tested and did not carry the gene  (d) Shela's MSH2 VUS has been reclassified as likely benign  (e) she is status post remote TAH/BSO  (f) intensified screening, with mammography in February and breast MRI in August planned  (6) staging studies showed right-sided lung nodules, too small to characterize, and a dominant right-sided thyroid  nodule unchanged in size as compared to 2011; to be followed  (a) chest CT 04/24/2016 showed the small pulmonary nodules to be completely stable, consistent with benign findings.  (b) thyroid  ultrasound--done on 07/22/2018 showed enlarged heterogeneous lobular and multinodular thyroid  gland, TI-RADS category 3 nodules, not meeting criteria for further biopsy or dedicated imaging f/u.    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  (8)  anastrozole  started 07/08/2018 for breast cancer prevention, discontinued 01/30/2020 with disease recurrence  (a) bone density scan 01/01/2018 normal, T score -0.6  (9) RECURRENT DISEASE May 2021  (a) left breast overlapping biopsy 01/20/2020 shows a clinical  T2 N0, stage IIB invasive ductal carcinoma, grade 2 or 3, with moderate estrogen receptor positivity, progesterone receptor negative, HER-2 not amplified, MIB-1 of 80%  (b) left mastectomy on 02/14/2020 showed a pT2 pNX, invasive ductal carcinoma, grade 3, with negative margins   (c) CT of the chest and bone scan on 02/08/2020 shows no distant metastases  (10) started fulvestrant  and palbociclib  02/24/2020 discontinued on 03/08/2020  (a) Oncotype sent on surgical sample shows score of 60 indicating a greater than 29% risk of recurrence on antiestrogens alone, and also predicting a significant benefit from chemotherapy.  (b) the Oncotype test found this tumor to be triple negative by single gene scores.   (11) cyclophosphamide , methotrexate , and fluorouracil  [CMF] every 28 days x 8 cycles given from 03/27/2020-08/22/2020.  (12) fulvestrant  resumed 10/03/2020   PLAN:  Assessment and Plan Assessment & Plan Left breast cancer, recurrent episode in 2021 Now on adj faslodex . Tolerating it well. Right breast normal on inspection and palpation. Continue adj faslodex . RTC in 6 months.  Lymphedema Chronic lymphedema with persistent discomfort. Increased use of compression sleeve and home exercises recommended. - Continue home exercises. - Use Tylenol  as needed.  Total time spent: 20 minutes  *Total Encounter Time as defined by the Centers for Medicare and Medicaid Services includes, in addition to the face-to-face time of a patient visit (documented in the note above) non-face-to-face time: obtaining and reviewing outside history, ordering and reviewing medications, tests or procedures, care coordination (communications with other health care  professionals or caregivers) and documentation in the medical record.

## 2024-04-15 ENCOUNTER — Ambulatory Visit: Admitting: Orthopaedic Surgery

## 2024-04-15 VITALS — Ht 61.5 in | Wt 235.0 lb

## 2024-04-15 DIAGNOSIS — Z6841 Body Mass Index (BMI) 40.0 and over, adult: Secondary | ICD-10-CM | POA: Diagnosis not present

## 2024-04-15 DIAGNOSIS — M1711 Unilateral primary osteoarthritis, right knee: Secondary | ICD-10-CM | POA: Diagnosis not present

## 2024-04-15 MED ORDER — METHYLPREDNISOLONE ACETATE 40 MG/ML IJ SUSP
40.0000 mg | INTRAMUSCULAR | Status: AC | PRN
  Administered 2024-04-15: 40 mg via INTRA_ARTICULAR

## 2024-04-15 MED ORDER — BUPIVACAINE HCL 0.5 % IJ SOLN
2.0000 mL | INTRAMUSCULAR | Status: AC | PRN
  Administered 2024-04-15: 2 mL via INTRA_ARTICULAR

## 2024-04-15 MED ORDER — LIDOCAINE HCL 1 % IJ SOLN
2.0000 mL | INTRAMUSCULAR | Status: AC | PRN
  Administered 2024-04-15: 2 mL

## 2024-04-15 NOTE — Progress Notes (Signed)
 Office Visit Note   Patient: [name]           Date of Birth: September 16, 1950           MRN: [id number] Visit Date: 04/15/2024              Requested by: Georgina Speaks, FNP [address] [place],  [place] [address] PCP: Georgina Speaks, FNP   Assessment & Plan: Visit Diagnoses:  1. Primary osteoarthritis of right knee   2. Body mass index 40.0-44.9, adult (HCC)     Plan: History of Present Illness [name] is a 73 year old female with knee osteoarthritis who presents for a follow-up regarding her right knee pain.  She experiences persistent discomfort in her right knee and seeks another corticosteroid injection for relief. The previous injection provided significant relief for approximately four months.  She describes a noticeable 'little block' behind the top part of her leg, which is not painful and is present only in the right leg.  She is actively engaged in weight loss efforts, recognizing its importance in managing her knee condition.  Physical Exam MUSCULOSKELETAL: Baker's cyst palpated behind right knee.  Assessment and Plan Right knee osteoarthritis with Baker's cyst Chronic osteoarthritis with asymptomatic Baker's cyst. Previous injection effective for four months. Injections may lose efficacy, possibly requiring surgery. Weight loss essential for improved outcomes and surgery delay. - Administer knee injection for pain relief. - Encouraged weight loss to improve knee health and delay surgery.  Follow-Up Instructions: No follow-ups on file.   Orders:  Orders Placed This Encounter  Procedures   Large Joint Inj   No orders of the defined types were placed in this encounter.     Procedures: Large Joint Inj: R knee on 04/15/2024 1:27 PM Indications: pain Details: 22 G needle  Arthrogram: No  Medications: 40 mg methylPREDNISolone  acetate 40 MG/ML; 2 mL lidocaine  1 %; 2 mL bupivacaine  0.5 % Consent was given by the patient.  Patient was prepped and draped in the usual sterile fashion.       Clinical Data: No additional findings.   Subjective: Chief Complaint  Patient presents with   Right Knee - Pain    HPI  Review of Systems   Objective: Vital Signs: Ht 5' 1.5 (1.562 m)   Wt 235 lb (106.6 kg)   BMI 43.68 kg/m   Physical Exam  Ortho Exam  Specialty Comments:  No specialty comments available.  Imaging: No results found.   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/24/2022   Port-A-Cath in place 03/27/2020   Recurrent cancer of left breast (HCC) 02/14/2020   Recurrent breast adenocarcinoma (HCC) 01/30/2020   Essential hypertension 12/07/2019   Dyspnea on exertion 11/11/2018   Right leg pain 11/11/2018   Obesity, morbid, BMI 40.0-49.9 (HCC) 10/11/2015   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  Genetic testing 05/24/2015   Elevated LFTs 03/08/2015   Anemia in neoplastic disease 06/12/2014   Hypokalemia 05/29/2014   Watery eyes 05/09/2014   Malignant neoplasm of colon (HCC) 04/03/2014   Family history of malignant neoplasm of breast 04/03/2014   Malignant neoplasm of upper-outer quadrant of left breast in female, estrogen receptor negative (HCC) 03/01/2014   Past Medical History:  Diagnosis Date   Breast cancer  (HCC) 2021   mastectomy   Colon cancer (HCC)    Hx of rotator cuff surgery    Hypertension    left breast cancer    Personal history of chemotherapy 2021   left   Personal history of radiation therapy 2016   left breast    Radiation 11/15/14-01/02/15   left breast, axillary and supraclavicular region 45 gray, lumpectomy cavity boosted to 16 gray    Family History  Problem Relation Age of Onset   Breast cancer Mother 90   Stroke Father 30       cause of death   Diabetes Father    Breast cancer Maternal Aunt    Ovarian cancer Neg Hx     Past Surgical History:  Procedure Laterality Date   ABDOMINAL HYSTERECTOMY  1988   BREAST EXCISIONAL BIOPSY Right 12/21/2017   BREAST LUMPECTOMY Left 2016   COLON SURGERY  1988   colectomy/colostomy-ca   COLON SURGERY  1988   colostomy takedown-reversal   COLONOSCOPY     EXCISION / BIOPSY BREAST / NIPPLE / DUCT Left 09/20/2014   IR IMAGING GUIDED PORT INSERTION  03/22/2020   MASTECTOMY Left 02/14/2020   MASTECTOMY W/ SENTINEL NODE BIOPSY Left 02/14/2020   Procedure: LEFT MASTECTOMY WITH BLUE DYE INJECTION;  Surgeon: Ebbie Cough, MD;  Location: [facility];  Service: General;  Laterality: Left;  PEC BLOCK   RADIOACTIVE SEED GUIDED EXCISIONAL BREAST BIOPSY Right 12/21/2017   Procedure: RIGHT RADIOACTIVE SEED GUIDED EXCISIONAL BREAST BIOPSY ERAS PATHWAY;  Surgeon: Ebbie Cough, MD;  Location: [facility];  Service: General;  Laterality: Right;   RADIOACTIVE SEED GUIDED PARTIAL MASTECTOMY WITH AXILLARY SENTINEL LYMPH NODE BIOPSY Left 09/20/2014   Procedure: RADIOACTIVE SEED GUIDED LEFT BREAST LUMPECTOMY WITH AXILLARY SENTINEL LYMPH NODE BIOPSY;  Surgeon: Cough Ebbie, MD;  Location: [facility];  Service: General;  Laterality: Left;   TOTAL SHOULDER ARTHROPLASTY  2009   right   Social History   Occupational History    Employer: [employer]    Comment: retired in 2008  Tobacco Use   Smoking status: Never    Smokeless tobacco: Never  Vaping Use   Vaping status: Never Used  Substance and Sexual Activity   Alcohol  use: Not Currently    Comment: occ-rare   Drug use: No   Sexual activity: Not Currently

## 2024-05-06 ENCOUNTER — Inpatient Hospital Stay: Attending: Hematology and Oncology

## 2024-05-06 VITALS — BP 152/86 | HR 93 | Resp 18

## 2024-05-06 DIAGNOSIS — C50812 Malignant neoplasm of overlapping sites of left female breast: Secondary | ICD-10-CM | POA: Insufficient documentation

## 2024-05-06 DIAGNOSIS — Z95828 Presence of other vascular implants and grafts: Secondary | ICD-10-CM

## 2024-05-06 DIAGNOSIS — Z171 Estrogen receptor negative status [ER-]: Secondary | ICD-10-CM | POA: Insufficient documentation

## 2024-05-06 DIAGNOSIS — Z5111 Encounter for antineoplastic chemotherapy: Secondary | ICD-10-CM | POA: Insufficient documentation

## 2024-05-06 DIAGNOSIS — C50919 Malignant neoplasm of unspecified site of unspecified female breast: Secondary | ICD-10-CM

## 2024-05-06 MED ORDER — FULVESTRANT 250 MG/5ML IM SOSY
500.0000 mg | PREFILLED_SYRINGE | Freq: Once | INTRAMUSCULAR | Status: AC
  Administered 2024-05-06: 500 mg via INTRAMUSCULAR
  Filled 2024-05-06: qty 10

## 2024-06-03 ENCOUNTER — Inpatient Hospital Stay: Attending: Hematology and Oncology

## 2024-06-03 VITALS — BP 145/84 | HR 76 | Temp 98.6°F | Resp 17

## 2024-06-03 DIAGNOSIS — C50812 Malignant neoplasm of overlapping sites of left female breast: Secondary | ICD-10-CM | POA: Insufficient documentation

## 2024-06-03 DIAGNOSIS — Z79899 Other long term (current) drug therapy: Secondary | ICD-10-CM | POA: Insufficient documentation

## 2024-06-03 DIAGNOSIS — Z5111 Encounter for antineoplastic chemotherapy: Secondary | ICD-10-CM | POA: Insufficient documentation

## 2024-06-03 DIAGNOSIS — Z171 Estrogen receptor negative status [ER-]: Secondary | ICD-10-CM | POA: Diagnosis not present

## 2024-06-03 DIAGNOSIS — Z95828 Presence of other vascular implants and grafts: Secondary | ICD-10-CM

## 2024-06-03 DIAGNOSIS — C50919 Malignant neoplasm of unspecified site of unspecified female breast: Secondary | ICD-10-CM

## 2024-06-03 MED ORDER — FULVESTRANT 250 MG/5ML IM SOSY
500.0000 mg | PREFILLED_SYRINGE | Freq: Once | INTRAMUSCULAR | Status: AC
  Administered 2024-06-03: 500 mg via INTRAMUSCULAR
  Filled 2024-06-03: qty 10

## 2024-07-01 ENCOUNTER — Inpatient Hospital Stay: Attending: Hematology and Oncology

## 2024-07-01 VITALS — BP 152/97 | HR 101 | Temp 98.6°F | Resp 19

## 2024-07-01 DIAGNOSIS — Z79899 Other long term (current) drug therapy: Secondary | ICD-10-CM | POA: Diagnosis not present

## 2024-07-01 DIAGNOSIS — Z171 Estrogen receptor negative status [ER-]: Secondary | ICD-10-CM | POA: Insufficient documentation

## 2024-07-01 DIAGNOSIS — C50412 Malignant neoplasm of upper-outer quadrant of left female breast: Secondary | ICD-10-CM

## 2024-07-01 DIAGNOSIS — Z5111 Encounter for antineoplastic chemotherapy: Secondary | ICD-10-CM | POA: Insufficient documentation

## 2024-07-01 DIAGNOSIS — C50812 Malignant neoplasm of overlapping sites of left female breast: Secondary | ICD-10-CM | POA: Diagnosis not present

## 2024-07-01 DIAGNOSIS — Z95828 Presence of other vascular implants and grafts: Secondary | ICD-10-CM

## 2024-07-01 DIAGNOSIS — Z1501 Genetic susceptibility to malignant neoplasm of breast: Secondary | ICD-10-CM

## 2024-07-01 MED ORDER — FULVESTRANT 250 MG/5ML IM SOSY
500.0000 mg | PREFILLED_SYRINGE | Freq: Once | INTRAMUSCULAR | Status: AC
  Administered 2024-07-01: 500 mg via INTRAMUSCULAR
  Filled 2024-07-01: qty 10

## 2024-07-04 ENCOUNTER — Other Ambulatory Visit: Payer: Self-pay | Admitting: Hematology and Oncology

## 2024-07-04 DIAGNOSIS — I10 Essential (primary) hypertension: Secondary | ICD-10-CM

## 2024-07-04 DIAGNOSIS — R0609 Other forms of dyspnea: Secondary | ICD-10-CM

## 2024-07-04 NOTE — Telephone Encounter (Signed)
 No notes on the last office note to advise that patient should continue Lisinopril  ordered by Dr Loretha.

## 2024-07-18 ENCOUNTER — Encounter: Payer: Self-pay | Admitting: Radiology

## 2024-07-29 ENCOUNTER — Inpatient Hospital Stay: Attending: Hematology and Oncology

## 2024-07-29 VITALS — BP 135/83 | HR 85 | Temp 98.3°F | Resp 16

## 2024-07-29 DIAGNOSIS — Z171 Estrogen receptor negative status [ER-]: Secondary | ICD-10-CM | POA: Diagnosis not present

## 2024-07-29 DIAGNOSIS — Z79899 Other long term (current) drug therapy: Secondary | ICD-10-CM | POA: Diagnosis not present

## 2024-07-29 DIAGNOSIS — Z5111 Encounter for antineoplastic chemotherapy: Secondary | ICD-10-CM | POA: Diagnosis not present

## 2024-07-29 DIAGNOSIS — C50812 Malignant neoplasm of overlapping sites of left female breast: Secondary | ICD-10-CM | POA: Insufficient documentation

## 2024-07-29 DIAGNOSIS — Z95828 Presence of other vascular implants and grafts: Secondary | ICD-10-CM

## 2024-07-29 DIAGNOSIS — C50919 Malignant neoplasm of unspecified site of unspecified female breast: Secondary | ICD-10-CM

## 2024-07-29 MED ORDER — FULVESTRANT 250 MG/5ML IM SOSY
500.0000 mg | PREFILLED_SYRINGE | Freq: Once | INTRAMUSCULAR | Status: AC
  Administered 2024-07-29: 500 mg via INTRAMUSCULAR
  Filled 2024-07-29: qty 10

## 2024-07-29 NOTE — Patient Instructions (Signed)
 Fulvestrant Injection What is this medication? FULVESTRANT (ful VES trant) treats breast cancer. It works by blocking the hormone estrogen in breast tissue, which prevents breast cancer cells from spreading or growing. This medicine may be used for other purposes; ask your health care provider or pharmacist if you have questions. COMMON BRAND NAME(S): FASLODEX What should I tell my care team before I take this medication? They need to know if you have any of these conditions: Bleeding disorder Liver disease Low blood cell levels (white cells, red cells, and platelets) An unusual or allergic reaction to fulvestrant, other medications, foods, dyes, or preservatives Pregnant or trying to get pregnant Breastfeeding How should I use this medication? This medication is injected into a muscle. It is given by your care team in a hospital or clinic setting. Talk to your care team about the use of this medication in children. Special care may be needed. Overdosage: If you think you have taken too much of this medicine contact a poison control center or emergency room at once. NOTE: This medicine is only for you. Do not share this medicine with others. What if I miss a dose? Keep appointments for follow-up doses. It is important not to miss your dose. Call your care team if you are unable to keep an appointment. What may interact with this medication? Fluoroestradiol F18 This list may not describe all possible interactions. Give your health care provider a list of all the medicines, herbs, non-prescription drugs, or dietary supplements you use. Also tell them if you smoke, drink alcohol, or use illegal drugs. Some items may interact with your medicine. What should I watch for while using this medication? Your condition will be monitored carefully while you are receiving this medication. You may need blood work done while you are taking this medication. This medication is injected into a muscle. Talk  to your care team if you also take medications that prevent or treat blood clots, such as warfarin. Blood thinners may increase the risk of bleeding or bruising in the muscle where this medication is injected. The benefits of this medication may outweigh the risks. Your care team can help you find the option that works for you. They can also help limit the risk of bleeding. Talk to your care team if you may be pregnant. Serious birth defects can occur if you take this medication during pregnancy and for 1 year after the last dose. You will need a negative pregnancy test before starting this medication. Contraception is recommended while taking this medication and for 1 year after the last dose. Your care team can help you find the option that works for you. Do not breastfeed while taking this medication and for 1 year after the last dose. This medication may cause infertility. Talk to your care team if you are concerned about your fertility. What side effects may I notice from receiving this medication? Side effects that you should report to your care team as soon as possible: Allergic reactions or angioedema--skin rash, itching or hives, swelling of the face, eyes, lips, tongue, arms, or legs, trouble swallowing or breathing Pain, tingling, or numbness in the hands or feet Side effects that usually do not require medical attention (report to your care team if they continue or are bothersome): Bone, joint, or muscle pain Constipation Headache Hot flashes Nausea Pain, redness, or irritation at injection site Unusual weakness or fatigue This list may not describe all possible side effects. Call your doctor for medical advice about side  effects. You may report side effects to FDA at 1-800-FDA-1088. Where should I keep my medication? This medication is given in a hospital or clinic. It will not be stored at home. NOTE: This sheet is a summary. It may not cover all possible information. If you have  questions about this medicine, talk to your doctor, pharmacist, or health care provider.  2024 Elsevier/Gold Standard (2023-05-08 00:00:00)

## 2024-08-26 ENCOUNTER — Inpatient Hospital Stay: Attending: Hematology and Oncology

## 2024-08-26 VITALS — BP 135/101 | HR 100 | Temp 98.4°F | Resp 16

## 2024-08-26 DIAGNOSIS — Z171 Estrogen receptor negative status [ER-]: Secondary | ICD-10-CM | POA: Diagnosis present

## 2024-08-26 DIAGNOSIS — C50412 Malignant neoplasm of upper-outer quadrant of left female breast: Secondary | ICD-10-CM

## 2024-08-26 DIAGNOSIS — Z5111 Encounter for antineoplastic chemotherapy: Secondary | ICD-10-CM | POA: Insufficient documentation

## 2024-08-26 DIAGNOSIS — Z95828 Presence of other vascular implants and grafts: Secondary | ICD-10-CM

## 2024-08-26 DIAGNOSIS — C50812 Malignant neoplasm of overlapping sites of left female breast: Secondary | ICD-10-CM | POA: Diagnosis present

## 2024-08-26 DIAGNOSIS — Z1501 Genetic susceptibility to malignant neoplasm of breast: Secondary | ICD-10-CM

## 2024-08-26 DIAGNOSIS — Z79899 Other long term (current) drug therapy: Secondary | ICD-10-CM | POA: Insufficient documentation

## 2024-08-26 MED ORDER — FULVESTRANT 250 MG/5ML IM SOSY
500.0000 mg | PREFILLED_SYRINGE | Freq: Once | INTRAMUSCULAR | Status: AC
  Administered 2024-08-26: 500 mg via INTRAMUSCULAR
  Filled 2024-08-26: qty 10

## 2024-09-23 ENCOUNTER — Inpatient Hospital Stay

## 2024-09-23 ENCOUNTER — Inpatient Hospital Stay: Attending: Hematology and Oncology | Admitting: Hematology and Oncology

## 2024-09-23 VITALS — BP 138/82 | HR 111 | Temp 97.8°F | Resp 17 | Wt 241.5 lb

## 2024-09-23 DIAGNOSIS — C50412 Malignant neoplasm of upper-outer quadrant of left female breast: Secondary | ICD-10-CM | POA: Insufficient documentation

## 2024-09-23 DIAGNOSIS — Z1501 Genetic susceptibility to malignant neoplasm of breast: Secondary | ICD-10-CM | POA: Diagnosis not present

## 2024-09-23 DIAGNOSIS — Z79899 Other long term (current) drug therapy: Secondary | ICD-10-CM | POA: Diagnosis not present

## 2024-09-23 DIAGNOSIS — Z95828 Presence of other vascular implants and grafts: Secondary | ICD-10-CM

## 2024-09-23 DIAGNOSIS — C50919 Malignant neoplasm of unspecified site of unspecified female breast: Secondary | ICD-10-CM | POA: Diagnosis not present

## 2024-09-23 DIAGNOSIS — Z1589 Genetic susceptibility to other disease: Secondary | ICD-10-CM

## 2024-09-23 DIAGNOSIS — Z5111 Encounter for antineoplastic chemotherapy: Secondary | ICD-10-CM | POA: Insufficient documentation

## 2024-09-23 DIAGNOSIS — Z171 Estrogen receptor negative status [ER-]: Secondary | ICD-10-CM | POA: Diagnosis not present

## 2024-09-23 MED ORDER — FULVESTRANT 250 MG/5ML IM SOSY
500.0000 mg | PREFILLED_SYRINGE | Freq: Once | INTRAMUSCULAR | Status: AC
  Administered 2024-09-23: 500 mg via INTRAMUSCULAR
  Filled 2024-09-23: qty 10

## 2024-09-23 NOTE — Progress Notes (Signed)
 " [facility]  [phone] [phone]    ID: [name] DOB: 1951-04-01  MR#: [id number]  RDW#:[id number]  Patient Care Team: Ruth Speaks, FNP as PCP - General (General Practice) Ruth Cough, MD as Consulting Physician (General Surgery) Ruth Agent, MD as Consulting Physician (Radiation Oncology) Ruth Carwin, MD as Consulting Physician (Ophthalmology) Ruth Ash, MD as Consulting Physician (Hematology and Oncology)   CHIEF COMPLAINT: triple negative breast cancer; PALB2 positive (s/p left mastectomy)  CURRENT TREATMENT: fulvestrant   INTERVAL HISTORY:  Discussed the use of AI scribe software for clinical note transcription with the patient, who gave verbal consent to proceed.  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  She is currently receiving ongoing Faslodex  injections for ER pos breast cancer, with therapy initiated on January 2022. She anticipates completing Faslodex  therapy next year and has not had any changes in her medications since the last visit.  She has not noticed any changes in her right breast and routinely performs self-examinations. No new breast symptoms are present.  She experiences mild, chronic dyspnea, which has remained stable without recent progression. She denies new bone pain, headaches, diplopia, or falls. Bowel habits are regular, with daily or twice daily movements depending on diet. No urinary difficulties are reported.     COVID 19 VACCINATION STATUS: fully vaccinated [name], with booster 11/2020   BREAST CANCER HISTORY: From the original intake note:  [name] herself palpated a mass in her left breast, and immediately brought it to the  attention of her primary care physician, Dr. Jarold, who confirmed the finding and setup the patient for bilateral diagnostic mammography at the breast Center 02/20/2014. This showed a high density mass in the outer left breast measuring up to 3.7 cm. It corresponded to the site of palpable concern. In addition the normal 4 logically abnormal lymph nodes in the left axilla. The mass was palpable by exam, and by ultrasonography measured 2.6 cm. There were enlarged and morphologically abnormal lymph nodes in the left axilla, largest measuring 3 cm.  Biopsy of the left breast mass in question and 1 of the abnormal axillary lymph nodes 02/22/2014 showed ([id number]) biopsies to be positive for an invasive ductal carcinoma, grade 3, triple negative, with an MIB-1 of 90%. Specifically the HER-2 signals ratio was 1.05, and the number per cell 2.00.  MRI of the breasts 03/01/2014 showed a 3.8 cm enhancing mass with areas of apparent necrosis in the left breast, as well as the biopsy tract extending laterally, the combined measure 5.3 cm. There were no other masses or areas of abnormal enhancement. There were multiple abnormally enlarged left axillary lymph nodes with loss of the normal fatty hilum. These included level I and retropectoral lymph nodes.  The patient's subsequent history is as detailed below   PAST MEDICAL HISTORY: Past Medical History:  Diagnosis Date   Breast cancer (HCC) 2021   mastectomy   Colon cancer (HCC)    Hx of rotator cuff surgery    Hypertension    left breast cancer    Personal history of chemotherapy 2021   left   Personal history of radiation therapy 2016   left breast    Radiation 11/15/14-01/02/15   left breast, axillary and supraclavicular region 45 gray, lumpectomy cavity  boosted to 16 gray    PAST SURGICAL HISTORY: Past Surgical History:  Procedure Laterality Date   ABDOMINAL HYSTERECTOMY  1988   BREAST EXCISIONAL BIOPSY Right 12/21/2017   BREAST LUMPECTOMY  Left 2016   COLON SURGERY  1988   colectomy/colostomy-ca   COLON SURGERY  1988   colostomy takedown-reversal   COLONOSCOPY     EXCISION / BIOPSY BREAST / NIPPLE / DUCT Left 09/20/2014   IR IMAGING GUIDED PORT INSERTION  03/22/2020   MASTECTOMY Left 02/14/2020   MASTECTOMY W/ SENTINEL NODE BIOPSY Left 02/14/2020   Procedure: LEFT MASTECTOMY WITH BLUE DYE INJECTION;  Surgeon: Ruth Cough, MD;  Location: [facility] OR;  Service: General;  Laterality: Left;  PEC BLOCK   RADIOACTIVE SEED GUIDED EXCISIONAL BREAST BIOPSY Right 12/21/2017   Procedure: RIGHT RADIOACTIVE SEED GUIDED EXCISIONAL BREAST BIOPSY ERAS PATHWAY;  Surgeon: Ruth Cough, MD;  Location: [facility];  Service: General;  Laterality: Right;   RADIOACTIVE SEED GUIDED PARTIAL MASTECTOMY WITH AXILLARY SENTINEL LYMPH NODE BIOPSY Left 09/20/2014   Procedure: RADIOACTIVE SEED GUIDED LEFT BREAST LUMPECTOMY WITH AXILLARY SENTINEL LYMPH NODE BIOPSY;  Surgeon: Lopez Ebbie, MD;  Location: [facility];  Service: General;  Laterality: Left;   TOTAL SHOULDER ARTHROPLASTY  2009   right    FAMILY HISTORY Family History  Problem Relation Age of Onset   Breast cancer Mother 66   Stroke Father 57       cause of death   Diabetes Father    Breast cancer Maternal Aunt    Ovarian cancer Neg Hx    the patient's father died at the age of 61 following a stroke in the setting of diabetes; the patient's mother is living at 66. The patient has 3 brothers and 2 sisters. [name], [name], was diagnosed with breast cancer in her early 96s. There is no other breast or ovarian cancer in the family. The patient herself was diagnosed with [name] by history he is an incidentally found stage I colon carcinoma noted at the time of her hysterectomy. She had a partial colectomy but no adjuvant treatment. We have no records regarding that procedure   GYNECOLOGIC HISTORY:  No LMP recorded. Patient has had a  hysterectomy. Menarche age 73, first live birth age 59, the patient underwent total abdominal hysterectomy with bilateral salpingo-oophorectomy in her 62s. She did not take hormone replacement. She did not take oral contraceptives at any point.   SOCIAL HISTORY: (Updated May 2021) Adalyne worked for [employer] for about 40 years, retiring in 2008. She is divorced and lives alone, with no pets.  Her grandson who is a sports administrator frequently stays with her.  Her daughter Leeasia Secrist works for [employer] in audiological scientist. The second daughter, Kyelle Urbas, works as a conservation officer, nature at [employer].  The patient has 6 grandchildren and three great-grandchildren. She attends a [employer].     ADVANCED DIRECTIVES: Not in place. The patient intends to name her daughter Sonny DEL. her healthcare power of attorney. Sonny is work number is [phone]. On the patient's 03/01/2014 visit she was given a copy of the appropriate documents to complete and notarize at her discretion.     HEALTH MAINTENANCE: Social History   Tobacco Use   Smoking status: Never   Smokeless tobacco: Never  Vaping Use   Vaping status: Never Used  Substance Use Topics   Alcohol  use: Not Currently    Comment: occ-rare   Drug use: No  Colonoscopy: 2000  PAP: May 2015  Bone density: 12/21/2017, T-score of -0.6  Lipid panel: 02/03/2014  No Known Allergies  Current Outpatient Medications  Medication Sig Dispense Refill   acetaminophen  (TYLENOL ) 500 MG tablet Take 1,000 mg by mouth every 6 (six) hours as needed for mild pain or moderate pain.     diclofenac  Sodium (VOLTAREN ) 1 % GEL Apply 2 g topically 4 (four) times daily. 100 g 2   gabapentin  (NEURONTIN ) 300 MG capsule Take 1 capsule (300 mg total) by mouth at bedtime. 90 capsule 4   hydroxypropyl methylcellulose / hypromellose (ISOPTO TEARS / GONIOVISC) 2.5 % ophthalmic solution Place 1 drop into both eyes daily as needed for dry eyes.     Liniments  (SALONPAS ARTHRITIS PAIN RELIEF EX) Apply 1 application topically daily as needed (knee pain).     lisinopril -hydrochlorothiazide  (ZESTORETIC ) 20-12.5 MG tablet TAKE 1/2 TABLET BY MOUTH DAILY 45 tablet 0   LUMIGAN 0.01 % SOLN      Multiple Vitamins-Minerals (AIRBORNE PO) Take 1 tablet by mouth 2 (two) times a week.     ursodiol  (ACTIGALL ) 500 MG tablet Take 500 mg by mouth 3 (three) times daily.      No current facility-administered medications for this visit.    OBJECTIVE: African-American woman who appears older than stated age Vitals:   09/23/24 0853  BP: (!) 166/96  Pulse: (!) 111  Resp: 17  Temp: 97.8 F (36.6 C)  SpO2: 99%      Filed Weights   09/23/24 0853  Weight: 241 lb 8 oz (109.5 kg)      Body mass index is 44.89 kg/m.     ECOG FS:1 - Symptomatic but completely ambulatory   Physical Exam Constitutional:      Appearance: Normal appearance.  Cardiovascular:     Rate and Rhythm: Normal rate and regular rhythm.     Pulses: Normal pulses.     Heart sounds: Normal heart sounds.  Chest:     Comments: Bilateral breasts inspected.  Left breast status postmastectomy.  Right breast with no palpable masses or adenopathy.  Abdominal:     General: There is no distension.     Tenderness: There is no abdominal tenderness.  Musculoskeletal:     Cervical back: Normal range of motion and neck supple. No rigidity.  Lymphadenopathy:     Cervical: No cervical adenopathy.  Skin:    General: Skin is warm and dry.  Neurological:     General: No focal deficit present.     Mental Status: She is alert.  Psychiatric:        Mood and Affect: Mood normal.    LAB RESULTS:  No results found for: SPEP, UPEP  Lab Results  Component Value Date   WBC 5.1 04/08/2024   NEUTROABS 2.8 04/08/2024   HGB 11.7 (L) 04/08/2024   HCT 35.5 (L) 04/08/2024   MCV 90.1 04/08/2024   PLT 304 04/08/2024      Chemistry      Component Value Date/Time   NA 140 04/08/2024 1205   NA 141  11/24/2023 0933   NA 142 11/13/2016 1050   K 3.8 04/08/2024 1205   K 4.1 11/13/2016 1050   CL 106 04/08/2024 1205   CO2 26 04/08/2024 1205   CO2 27 11/13/2016 1050   BUN 11 04/08/2024 1205   BUN 9 11/24/2023 0933   BUN 11.5 11/13/2016 1050   CREATININE 0.93 04/08/2024 1205   CREATININE 1.0 11/13/2016 1050  Component Value Date/Time   CALCIUM 9.9 04/08/2024 1205   CALCIUM 9.8 11/13/2016 1050   ALKPHOS 124 04/08/2024 1205   ALKPHOS 140 11/13/2016 1050   AST 12 (L) 04/08/2024 1205   AST 13 11/13/2016 1050   ALT 9 04/08/2024 1205   ALT 15 11/13/2016 1050   BILITOT 0.9 04/08/2024 1205   BILITOT 0.86 11/13/2016 1050       No results found for: LABCA2  No components found for: LABCA125  No results for input(s): INR in the last 168 hours.  Urinalysis    Component Value Date/Time   COLORURINE YELLOW 04/05/2008 1120   APPEARANCEUR CLEAR 04/05/2008 1120   LABSPEC 1.021 04/05/2008 1120   PHURINE 6.0 04/05/2008 1120   GLUCOSEU NEGATIVE 04/05/2008 1120   HGBUR NEGATIVE 04/05/2008 1120   BILIRUBINUR negative 08/25/2023 1433   BILIRUBINUR negative 12/12/2020 1236   KETONESUR negative 08/25/2023 1433   KETONESUR NEGATIVE 04/05/2008 1120   PROTEINUR Negative 12/12/2020 1236   PROTEINUR NEGATIVE 04/05/2008 1120   UROBILINOGEN 0.2 08/25/2023 1433   UROBILINOGEN 0.2 04/05/2008 1120   NITRITE Negative 08/25/2023 1433   NITRITE negative 12/12/2020 1236   NITRITE NEGATIVE 04/05/2008 1120   LEUKOCYTESUR Negative 08/25/2023 1433    STUDIES: No results found.     ASSESSMENT: 74 y.o. BRCA negative [place] woman s/p left breast upper outer quadrant and left axillary lymph node biopsy 02/22/2014, both positive for a clinical T2 N2, stage IIIA invasive ductal carcinoma, grade 3, triple negative, with an MIB-1 of 90%  (1) neoadjuvant chemotherapy started 03/13/2014, with cyclophosphamide  and doxorubicin  in dose dense fashion x4, with Neulasta  support on day 2, completed  04/24/2014, followed by weekly carboplatin  and paclitaxel  x12, paclitaxel  reduced during cycle 5 and cycle 7 because of elevated bilirubin levels  (2) left lumpectomy and sentinel lymph node sampling 09/20/2014 showed a complete pathologic response.  (3) radiation completed April 2016  (4) remote history of partial colectomy for early stage colon cancer incidentally found during TAH/BSO in the 1980s  (5) genetics testing since 04/03/2014, demonstrated a pathogenic mutation in the PALB2 gene  (a) other genes tested through the [employer] gene panel, [employer], showed no deleterious mutations.  (b) PALB2 mutations are known to increase the risk of breast and pancreatic cancer, possibly other cancers, but the data is preliminary  (c) the patient's 2 daughters have been tested and did not carry the gene  (d) [name]'s MSH2 VUS has been reclassified as likely benign  (e) she is status post remote TAH/BSO  (f) intensified screening, with mammography in February and breast MRI in August planned  (6) staging studies showed right-sided lung nodules, too small to characterize, and a dominant right-sided thyroid  nodule unchanged in size as compared to 2011; to be followed  (a) chest CT 04/24/2016 showed the small pulmonary nodules to be completely stable, consistent with benign findings.  (b) thyroid  ultrasound--done on 07/22/2018 showed enlarged heterogeneous lobular and multinodular thyroid  gland, TI-RADS category 3 nodules, not meeting criteria for further biopsy or dedicated imaging f/u.    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  (8) anastrozole  started 07/08/2018 for breast cancer prevention, discontinued 01/30/2020 with disease recurrence  (a) bone density scan 01/01/2018 normal, T score -0.6  (9) RECURRENT DISEASE May 2021  (a) left breast overlapping biopsy 01/20/2020  shows a clinical  T2 N0, stage IIB invasive ductal carcinoma, grade 2 or 3, with moderate estrogen  receptor positivity, progesterone receptor negative, HER-2 not amplified, MIB-1 of 80%  (b) left mastectomy on 02/14/2020 showed a pT2 pNX, invasive ductal carcinoma, grade 3, with negative margins   (c) CT of the chest and bone scan on 02/08/2020 shows no distant metastases  (10) started fulvestrant  and palbociclib  02/24/2020 discontinued on 03/08/2020  (a) Oncotype sent on surgical sample shows score of 60 indicating a greater than 29% risk of recurrence on antiestrogens alone, and also predicting a significant benefit from chemotherapy.  (b) the Oncotype test found this tumor to be triple negative by single gene scores.   (11) cyclophosphamide , methotrexate , and fluorouracil  [CMF] every 28 days x 8 cycles given from 03/27/2020-08/22/2020.  (12) fulvestrant  resumed 10/03/2020   PLAN: Assessment & Plan ER pos BC PALB2 positive, on Faslodex  therapy - She has one more yr of adj faslodex  - Ordered contrast-enhanced mammogram of right breast for June. - Arrange CEM scheduling with breast center. - Instructed continuation of Faslodex  therapy, completion anticipated next year. - Advised scheduling six months of Faslodex  appointments at checkout. - RTC in 6 months.  Total time spent: 20 minutes  *Total Encounter Time as defined by the Centers for Medicare and Medicaid Services includes, in addition to the face-to-face time of a patient visit (documented in the note above) non-face-to-face time: obtaining and reviewing outside history, ordering and reviewing medications, tests or procedures, care coordination (communications with other health care professionals or caregivers) and documentation in the medical record. "

## 2024-10-12 ENCOUNTER — Ambulatory Visit: Payer: Self-pay | Admitting: Nurse Practitioner

## 2024-10-21 ENCOUNTER — Other Ambulatory Visit: Payer: Self-pay | Admitting: Hematology and Oncology

## 2024-10-21 ENCOUNTER — Inpatient Hospital Stay: Attending: Hematology and Oncology

## 2024-10-21 VITALS — BP 150/110 | HR 108 | Temp 99.5°F | Resp 18

## 2024-10-21 DIAGNOSIS — C50412 Malignant neoplasm of upper-outer quadrant of left female breast: Secondary | ICD-10-CM

## 2024-10-21 DIAGNOSIS — C50919 Malignant neoplasm of unspecified site of unspecified female breast: Secondary | ICD-10-CM

## 2024-10-21 DIAGNOSIS — Z95828 Presence of other vascular implants and grafts: Secondary | ICD-10-CM

## 2024-10-21 MED ORDER — FULVESTRANT 250 MG/5ML IM SOSY
500.0000 mg | PREFILLED_SYRINGE | Freq: Once | INTRAMUSCULAR | Status: AC
  Administered 2024-10-21: 500 mg via INTRAMUSCULAR
  Filled 2024-10-21: qty 10

## 2024-11-18 ENCOUNTER — Inpatient Hospital Stay: Attending: Hematology and Oncology

## 2024-11-21 ENCOUNTER — Ambulatory Visit: Admitting: Nurse Practitioner

## 2024-12-16 ENCOUNTER — Inpatient Hospital Stay: Attending: Hematology and Oncology

## 2025-01-13 ENCOUNTER — Inpatient Hospital Stay: Attending: Hematology and Oncology

## 2025-02-10 ENCOUNTER — Inpatient Hospital Stay

## 2025-03-10 ENCOUNTER — Inpatient Hospital Stay: Attending: Hematology and Oncology | Admitting: Hematology and Oncology

## 2025-03-10 ENCOUNTER — Inpatient Hospital Stay

## 2025-03-15 ENCOUNTER — Ambulatory Visit: Payer: Self-pay
